# Patient Record
Sex: Male | Born: 1935 | Race: White | Hispanic: No | Marital: Married
Health system: Southern US, Community
[De-identification: ages and names within clinical notes are randomized; demographics above are authoritative.]

## PROBLEM LIST (undated history)

## (undated) DIAGNOSIS — E7800 Pure hypercholesterolemia, unspecified: Secondary | ICD-10-CM

## (undated) DIAGNOSIS — M316 Other giant cell arteritis: Secondary | ICD-10-CM

## (undated) DIAGNOSIS — E039 Hypothyroidism, unspecified: Secondary | ICD-10-CM

## (undated) DIAGNOSIS — H53123 Transient visual loss, bilateral: Secondary | ICD-10-CM

## (undated) DIAGNOSIS — I219 Acute myocardial infarction, unspecified: Secondary | ICD-10-CM

## (undated) DIAGNOSIS — B029 Zoster without complications: Secondary | ICD-10-CM

## (undated) DIAGNOSIS — F32A Depression, unspecified: Secondary | ICD-10-CM

## (undated) DIAGNOSIS — F419 Anxiety disorder, unspecified: Secondary | ICD-10-CM

## (undated) DIAGNOSIS — N400 Enlarged prostate without lower urinary tract symptoms: Secondary | ICD-10-CM

## (undated) DIAGNOSIS — K219 Gastro-esophageal reflux disease without esophagitis: Secondary | ICD-10-CM

## (undated) DIAGNOSIS — M1990 Unspecified osteoarthritis, unspecified site: Secondary | ICD-10-CM

## (undated) DIAGNOSIS — F329 Major depressive disorder, single episode, unspecified: Secondary | ICD-10-CM

## (undated) HISTORY — DX: Zoster without complications: B02.9

## (undated) HISTORY — DX: Major depressive disorder, single episode, unspecified: F32.9

## (undated) HISTORY — PX: NO PAST SURGERIES: SHX2092

## (undated) HISTORY — PX: OTHER SURGICAL HISTORY: SHX169

## (undated) HISTORY — DX: Hypothyroidism, unspecified: E03.9

## (undated) HISTORY — DX: Depression, unspecified: F32.A

---

## 1999-11-08 ENCOUNTER — Observation Stay (HOSPITAL_COMMUNITY): Admission: EM | Admit: 1999-11-08 | Discharge: 1999-11-09 | Payer: Self-pay | Admitting: Emergency Medicine

## 1999-11-08 ENCOUNTER — Encounter: Payer: Self-pay | Admitting: Emergency Medicine

## 2005-01-01 ENCOUNTER — Encounter: Admission: RE | Admit: 2005-01-01 | Discharge: 2005-01-01 | Payer: Self-pay | Admitting: Sports Medicine

## 2005-03-03 ENCOUNTER — Encounter: Admission: RE | Admit: 2005-03-03 | Discharge: 2005-03-03 | Payer: Self-pay | Admitting: Orthopedic Surgery

## 2005-03-31 ENCOUNTER — Encounter: Admission: RE | Admit: 2005-03-31 | Discharge: 2005-03-31 | Payer: Self-pay | Admitting: Orthopedic Surgery

## 2005-06-24 ENCOUNTER — Encounter: Admission: RE | Admit: 2005-06-24 | Discharge: 2005-06-24 | Payer: Self-pay | Admitting: Orthopedic Surgery

## 2009-09-26 ENCOUNTER — Encounter: Admission: RE | Admit: 2009-09-26 | Discharge: 2009-09-26 | Payer: Self-pay | Admitting: Sports Medicine

## 2010-01-21 ENCOUNTER — Encounter: Admission: RE | Admit: 2010-01-21 | Discharge: 2010-01-21 | Payer: Self-pay | Admitting: Orthopedic Surgery

## 2010-02-18 ENCOUNTER — Encounter: Admission: RE | Admit: 2010-02-18 | Discharge: 2010-02-18 | Payer: Self-pay | Admitting: Orthopedic Surgery

## 2010-04-21 ENCOUNTER — Encounter: Admission: RE | Admit: 2010-04-21 | Discharge: 2010-04-21 | Payer: Self-pay | Admitting: Sports Medicine

## 2010-09-14 ENCOUNTER — Encounter: Payer: Self-pay | Admitting: Orthopedic Surgery

## 2011-01-09 NOTE — Discharge Summary (Signed)
[employer]. [facility]  Patient:    Benjamin Hahn, Benjamin Hahn                   MRN: [id number] Adm. Date:  [id number] Disc. Date: [id number] Attending:  Reather Littler CC:         Peter M. Swaziland, M.D.                           Discharge Summary  HISTORY:  Patient was admitted with acute onset of chest pain in the morning of admission.  Chest pain was a heavy feeling with increase with respiration and not relieved by Tums.  He had some relief with nitroglycerin in the ER, but only about a half an hour later.  He also had some increased relief with GI cocktail.  MEDICATIONS:  Lipitor, Synthroid, imipramine, Buspar, Wellbutrin, Cardura, and Androgel.  PHYSICAL EXAMINATION:  Patients blood pressure was 154/90 on arrival to ER. General exam was normal.  Eyes, ENT normal.  Heart tones normal.  Lungs clear. Abdomen with minimal epigastric tenderness.  The rest of the exam was normal.  HOSPITAL COURSE:  Patients pain did not recur after treatment in the ER.  His heart monitor was normal.  Follow-up EKGs and enzymes were normal.  His white blood count, which was 12,700 on admission, was down to 8500 the next morning. ESR was normal at 6.  Serum chemistries were all normal.  Chest x-ray on admission was normal.  DISCHARGE DIAGNOSES: 1. Atypical chest pain, probably gastroesophageal reflux, possibly related to    recent Motrin use. 2. Rule out coronary artery disease. 3. Mild hypertension and hypercholesterolemia.  DISCHARGE CONDITION:  Improved.  DISCHARGE RECOMMENDATIONS:  Continue previous medications.  Stop Motrin. Start Zantac 300 q.d.  Schedule stress test with Dr. Swaziland in office and continue penicillin as directed by dentist.  May use Darvocet p.r.n. for dental pain.  FOLLOW-UP:  In the office as scheduled. DD:  11/09/99 TD:  11/10/99 Job: [id number]

## 2011-01-09 NOTE — Consult Note (Signed)
[place]. [facility]  Patient:    Benjamin Hahn, Benjamin Hahn                   MRN: [id number] Adm. Date:  [id number] Attending:  Reather Littler CC:         Reather Littler, M.D.                          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 He has no prior history of cardiac disease. He reports a normal stress test 10 years ago. Recently, he has had problems with bad teeth, having ad dental work done and is scheduled to have two teeth pulled. This morning, he awoke with discomfort in his neck and throat, as well as his face in general. The pain in his throat radiates down his sternum and also straight into his back. This pain has been constant throughout the day today and clearly is worse with deep breathing. He denies any positional change in his symptoms. He denies shortness of breath, diaphoresis, nausea, or vomiting. He really did not get any relief with sublingual nitroglycerin or GI cocktail.  PAST MEDICAL HISTORY:  Significant for mild hypertension, hypercholesterolemia,  depression and anxiety.  CURRENT MEDICATIONS:  Lipitor, Wellbutrin, and p.r.n. Motrin.  ALLERGIES:  No known allergies.  SOCIAL HISTORY:  The patient is married. He is an Pensions consultant. He denies tobacco or alcohol use.  FAMILY HISTORY:  Father died at age 6. Mother is alive at age 75 and has a history of fibrillation.  PHYSICAL EXAMINATION:  GENERAL:  The patient is a pleasant, white male in no apparent distress.  VITAL SIGNS:  Blood pressure is 132/76, pulse 88 and regular, temperature is afebrile.  HEENT:  Unremarkable.  NECK:  He has no JVD or bruits.  LUNGS:  Clear.  CARDIAC:  Reveals regular rate and rhythm. Normal S1 and S2 without murmurs, rubs, or gallops.  ABDOMEN:  Soft, nontender. There are no masses or  bruits.  EXTREMITIES:  Without edema. Pulses are 2+ and symmetric.  LABORATORY DATA:  White count is 12,700; with increased neutrophils. Otherwise BC and CMET are normal. CK-MB and troponin are normal.  ECG is normal.  Chest x-ray is normal.  IMPRESSION: 1. Atypical chest pain with pleuritic component. This does not sound anginal to me.    There is no evidence of pericarditis by exam or ECG. Possibly related to upper    respiratory virus or related to his periodontal disease. 2. Hypertension. 3. Hypercholesterolemia, controlled on Lipitor.  PLAN:  I agree with overnight observation. If his cardiac enzymes are negative nd ECG remains normal, would recommend discharge in the morning. Would treat with ral penicillin and/or nonsteroidal anti-inflammatory drug. It may be appropriate to  risk assess this patient given his age and comorbid risk factors with an outpatient stress test. DD:  11/08/99 TD:  11/09/99 Job: [id number]

## 2011-01-09 NOTE — H&P (Signed)
[place]. [facility]  Patient:    Benjamin Hahn, Benjamin Hahn                   MRN: [id number] Adm. Date:  [id number] Attending:  Reather Littler                         History and Physical  CHIEF COMPLAINT:  Chest pain.  HISTORY OF PRESENT ILLNESS:  This is  a 77 year old Caucasian male who started having chest pain after breakfast today.  The pain was in the middle of the chest and was moderately severe.  The pain was like a heaviness and would increase with deep breathing.  The pain seemed to persist off and on throughout the day, and as not relieved by taking Tums.  He had no associated cough or shortness of breath. He also had not experienced any nausea, abdominal pain, dizziness, sweating, or arm pain.  The patient has not had any prior cardiac history.  When he presented to the emergency room the patient was given nitroglycerin, with which the pain did improve, but only after about one-half hour or so.  CURRENT MEDICATIONS: 1. Lipitor. 2. Synthroid. 3. Imipramine. 4. BuSpar. 5. Wellbutrin. 6. Cardura. 7. Androgel.  ALLERGIES:  No known drug allergies.  PAST MEDICAL HISTORY:  History of diverticulitis.  FAMILY HISTORY:  Positive for CVA and diabetes mellitus.  SOCIAL HISTORY:  He is a nonsmoker.  He uses occasional alcohol.  No history of  increased alcohol intake.  REVIEW OF SYSTEMS:  He has had depression, hypogonadism, mild hypertension, prostatism, hypercholesterolemia, and hypothyroidism.  He was recently started n Cardura for hypertension and benign prostatic hypertrophy symptoms.  He also has had recent infection in his teeth, for which he is supposed to be on penicillin, but has been irregular.  He is due to get dental extractions soon.  He has not ad any recent major dental procedures, except for some cleaning.  PHYSICAL EXAMINATION:  GENERAL:  The patient is heavyset.  VITAL SIGNS:  Blood pressure 154/90 on admission to the  emergency room. Followup was 125/80, pulse 76.  GENERAL:  Normal.  HEENT:  Eyes:  Normal ENT examination.  He has dental caries present and several fillings.  Pharynx is normal.  HEART:  Sounds are normal.  No abnormalities of S1 or S2.  No abnormal sounds. No pericardial rub present in the sitting or lying position.  CHEST:  No chest wall tenderness.  LUNGS:  Clear.  NECK:  No carotid bruits.  ABDOMEN:  Mild upper epigastric localized tenderness.  No other tenderness or mass.  RECTAL:  Not indicated.  EXTREMITIES:  Normal.  Electrocardiogram:  Normal.  Chest x-ray:  Normal.  LABORATORY DATA:  White count 12,700.  IMPRESSION:  The patient has somewhat atypical chest pain, but it was apparently relieved with nitroglycerin, although in a delayed fashion.  PLAN:  To admit him to rule out cardiac causes.  Monitor his cardiac enzymes and get a cardiology consultation.  Meanwhile we will have him restart his penicillin and give him Protonix empirically. DD:  11/09/99 TD:  11/09/99 Job: 2023 [id number]

## 2011-09-01 DIAGNOSIS — E291 Testicular hypofunction: Secondary | ICD-10-CM | POA: Diagnosis not present

## 2011-09-16 DIAGNOSIS — E291 Testicular hypofunction: Secondary | ICD-10-CM | POA: Diagnosis not present

## 2011-09-22 DIAGNOSIS — J00 Acute nasopharyngitis [common cold]: Secondary | ICD-10-CM | POA: Diagnosis not present

## 2011-09-30 DIAGNOSIS — E291 Testicular hypofunction: Secondary | ICD-10-CM | POA: Diagnosis not present

## 2011-10-13 DIAGNOSIS — J310 Chronic rhinitis: Secondary | ICD-10-CM | POA: Diagnosis not present

## 2011-10-14 DIAGNOSIS — E291 Testicular hypofunction: Secondary | ICD-10-CM | POA: Diagnosis not present

## 2011-10-28 DIAGNOSIS — E291 Testicular hypofunction: Secondary | ICD-10-CM | POA: Diagnosis not present

## 2011-11-04 DIAGNOSIS — I10 Essential (primary) hypertension: Secondary | ICD-10-CM | POA: Diagnosis not present

## 2011-11-04 DIAGNOSIS — F329 Major depressive disorder, single episode, unspecified: Secondary | ICD-10-CM | POA: Diagnosis not present

## 2011-11-04 DIAGNOSIS — E291 Testicular hypofunction: Secondary | ICD-10-CM | POA: Diagnosis not present

## 2011-11-04 DIAGNOSIS — R635 Abnormal weight gain: Secondary | ICD-10-CM | POA: Diagnosis not present

## 2011-11-11 DIAGNOSIS — E291 Testicular hypofunction: Secondary | ICD-10-CM | POA: Diagnosis not present

## 2011-11-25 DIAGNOSIS — E291 Testicular hypofunction: Secondary | ICD-10-CM | POA: Diagnosis not present

## 2011-12-10 DIAGNOSIS — E291 Testicular hypofunction: Secondary | ICD-10-CM | POA: Diagnosis not present

## 2011-12-23 DIAGNOSIS — E291 Testicular hypofunction: Secondary | ICD-10-CM | POA: Diagnosis not present

## 2012-01-01 DIAGNOSIS — M5430 Sciatica, unspecified side: Secondary | ICD-10-CM | POA: Diagnosis not present

## 2012-01-06 DIAGNOSIS — E291 Testicular hypofunction: Secondary | ICD-10-CM | POA: Diagnosis not present

## 2012-01-11 DIAGNOSIS — R972 Elevated prostate specific antigen [PSA]: Secondary | ICD-10-CM | POA: Diagnosis not present

## 2012-01-11 DIAGNOSIS — E291 Testicular hypofunction: Secondary | ICD-10-CM | POA: Diagnosis not present

## 2012-01-20 DIAGNOSIS — R3915 Urgency of urination: Secondary | ICD-10-CM | POA: Diagnosis not present

## 2012-01-20 DIAGNOSIS — N401 Enlarged prostate with lower urinary tract symptoms: Secondary | ICD-10-CM | POA: Diagnosis not present

## 2012-01-20 DIAGNOSIS — E291 Testicular hypofunction: Secondary | ICD-10-CM | POA: Diagnosis not present

## 2012-01-20 DIAGNOSIS — N529 Male erectile dysfunction, unspecified: Secondary | ICD-10-CM | POA: Diagnosis not present

## 2012-02-04 DIAGNOSIS — E291 Testicular hypofunction: Secondary | ICD-10-CM | POA: Diagnosis not present

## 2012-02-09 DIAGNOSIS — E291 Testicular hypofunction: Secondary | ICD-10-CM | POA: Diagnosis not present

## 2012-02-09 DIAGNOSIS — E039 Hypothyroidism, unspecified: Secondary | ICD-10-CM | POA: Diagnosis not present

## 2012-02-09 DIAGNOSIS — E7800 Pure hypercholesterolemia, unspecified: Secondary | ICD-10-CM | POA: Diagnosis not present

## 2012-02-09 DIAGNOSIS — I10 Essential (primary) hypertension: Secondary | ICD-10-CM | POA: Diagnosis not present

## 2012-02-11 DIAGNOSIS — E291 Testicular hypofunction: Secondary | ICD-10-CM | POA: Diagnosis not present

## 2012-02-11 DIAGNOSIS — F329 Major depressive disorder, single episode, unspecified: Secondary | ICD-10-CM | POA: Diagnosis not present

## 2012-02-11 DIAGNOSIS — E7800 Pure hypercholesterolemia, unspecified: Secondary | ICD-10-CM | POA: Diagnosis not present

## 2012-02-11 DIAGNOSIS — E039 Hypothyroidism, unspecified: Secondary | ICD-10-CM | POA: Diagnosis not present

## 2012-02-11 DIAGNOSIS — M899 Disorder of bone, unspecified: Secondary | ICD-10-CM | POA: Diagnosis not present

## 2012-02-11 DIAGNOSIS — Z0000 Encounter for general adult medical examination without abnormal findings: Secondary | ICD-10-CM | POA: Diagnosis not present

## 2012-02-11 DIAGNOSIS — I10 Essential (primary) hypertension: Secondary | ICD-10-CM | POA: Diagnosis not present

## 2012-02-18 DIAGNOSIS — E291 Testicular hypofunction: Secondary | ICD-10-CM | POA: Diagnosis not present

## 2012-02-29 ENCOUNTER — Other Ambulatory Visit: Payer: Self-pay | Admitting: Sports Medicine

## 2012-02-29 DIAGNOSIS — M5440 Lumbago with sciatica, unspecified side: Secondary | ICD-10-CM

## 2012-02-29 DIAGNOSIS — M5430 Sciatica, unspecified side: Secondary | ICD-10-CM | POA: Diagnosis not present

## 2012-03-01 DIAGNOSIS — M899 Disorder of bone, unspecified: Secondary | ICD-10-CM | POA: Diagnosis not present

## 2012-03-01 DIAGNOSIS — M949 Disorder of cartilage, unspecified: Secondary | ICD-10-CM | POA: Diagnosis not present

## 2012-03-03 DIAGNOSIS — E291 Testicular hypofunction: Secondary | ICD-10-CM | POA: Diagnosis not present

## 2012-03-11 ENCOUNTER — Ambulatory Visit
Admission: RE | Admit: 2012-03-11 | Discharge: 2012-03-11 | Disposition: A | Discharge status: Home / Self Care | Payer: Medicare Other | Source: Ambulatory Visit | Attending: Sports Medicine | Admitting: Sports Medicine

## 2012-03-11 DIAGNOSIS — M5440 Lumbago with sciatica, unspecified side: Secondary | ICD-10-CM

## 2012-03-11 DIAGNOSIS — M545 Low back pain: Secondary | ICD-10-CM | POA: Diagnosis not present

## 2012-03-12 ENCOUNTER — Other Ambulatory Visit: Payer: Self-pay

## 2012-03-17 DIAGNOSIS — E291 Testicular hypofunction: Secondary | ICD-10-CM | POA: Diagnosis not present

## 2012-03-25 DIAGNOSIS — Z1211 Encounter for screening for malignant neoplasm of colon: Secondary | ICD-10-CM | POA: Diagnosis not present

## 2012-03-30 DIAGNOSIS — E291 Testicular hypofunction: Secondary | ICD-10-CM | POA: Diagnosis not present

## 2012-04-06 DIAGNOSIS — H5210 Myopia, unspecified eye: Secondary | ICD-10-CM | POA: Diagnosis not present

## 2012-04-06 DIAGNOSIS — H25019 Cortical age-related cataract, unspecified eye: Secondary | ICD-10-CM | POA: Diagnosis not present

## 2012-04-06 DIAGNOSIS — H52229 Regular astigmatism, unspecified eye: Secondary | ICD-10-CM | POA: Diagnosis not present

## 2012-04-06 DIAGNOSIS — H524 Presbyopia: Secondary | ICD-10-CM | POA: Diagnosis not present

## 2012-04-13 DIAGNOSIS — E291 Testicular hypofunction: Secondary | ICD-10-CM | POA: Diagnosis not present

## 2012-04-27 DIAGNOSIS — E291 Testicular hypofunction: Secondary | ICD-10-CM | POA: Diagnosis not present

## 2012-05-11 DIAGNOSIS — D235 Other benign neoplasm of skin of trunk: Secondary | ICD-10-CM | POA: Diagnosis not present

## 2012-05-11 DIAGNOSIS — E291 Testicular hypofunction: Secondary | ICD-10-CM | POA: Diagnosis not present

## 2012-05-11 DIAGNOSIS — D485 Neoplasm of uncertain behavior of skin: Secondary | ICD-10-CM | POA: Diagnosis not present

## 2012-05-18 DIAGNOSIS — Z23 Encounter for immunization: Secondary | ICD-10-CM | POA: Diagnosis not present

## 2012-05-25 DIAGNOSIS — E291 Testicular hypofunction: Secondary | ICD-10-CM | POA: Diagnosis not present

## 2012-06-08 DIAGNOSIS — E291 Testicular hypofunction: Secondary | ICD-10-CM | POA: Diagnosis not present

## 2012-06-22 DIAGNOSIS — E291 Testicular hypofunction: Secondary | ICD-10-CM | POA: Diagnosis not present

## 2012-07-06 DIAGNOSIS — E291 Testicular hypofunction: Secondary | ICD-10-CM | POA: Diagnosis not present

## 2012-07-19 DIAGNOSIS — R972 Elevated prostate specific antigen [PSA]: Secondary | ICD-10-CM | POA: Diagnosis not present

## 2012-07-19 DIAGNOSIS — E291 Testicular hypofunction: Secondary | ICD-10-CM | POA: Diagnosis not present

## 2012-07-20 DIAGNOSIS — E291 Testicular hypofunction: Secondary | ICD-10-CM | POA: Diagnosis not present

## 2012-07-27 DIAGNOSIS — E291 Testicular hypofunction: Secondary | ICD-10-CM | POA: Diagnosis not present

## 2012-07-27 DIAGNOSIS — N401 Enlarged prostate with lower urinary tract symptoms: Secondary | ICD-10-CM | POA: Diagnosis not present

## 2012-07-27 DIAGNOSIS — N529 Male erectile dysfunction, unspecified: Secondary | ICD-10-CM | POA: Diagnosis not present

## 2012-07-27 DIAGNOSIS — R3915 Urgency of urination: Secondary | ICD-10-CM | POA: Diagnosis not present

## 2012-08-03 DIAGNOSIS — E291 Testicular hypofunction: Secondary | ICD-10-CM | POA: Diagnosis not present

## 2012-08-22 DIAGNOSIS — E291 Testicular hypofunction: Secondary | ICD-10-CM | POA: Diagnosis not present

## 2012-09-05 DIAGNOSIS — E7800 Pure hypercholesterolemia, unspecified: Secondary | ICD-10-CM | POA: Diagnosis not present

## 2012-09-05 DIAGNOSIS — I10 Essential (primary) hypertension: Secondary | ICD-10-CM | POA: Diagnosis not present

## 2012-09-05 DIAGNOSIS — E039 Hypothyroidism, unspecified: Secondary | ICD-10-CM | POA: Diagnosis not present

## 2012-09-05 DIAGNOSIS — E291 Testicular hypofunction: Secondary | ICD-10-CM | POA: Diagnosis not present

## 2012-09-07 DIAGNOSIS — E039 Hypothyroidism, unspecified: Secondary | ICD-10-CM | POA: Diagnosis not present

## 2012-09-07 DIAGNOSIS — E291 Testicular hypofunction: Secondary | ICD-10-CM | POA: Diagnosis not present

## 2012-09-07 DIAGNOSIS — E7800 Pure hypercholesterolemia, unspecified: Secondary | ICD-10-CM | POA: Diagnosis not present

## 2012-09-07 DIAGNOSIS — I10 Essential (primary) hypertension: Secondary | ICD-10-CM | POA: Diagnosis not present

## 2012-09-07 DIAGNOSIS — F329 Major depressive disorder, single episode, unspecified: Secondary | ICD-10-CM | POA: Diagnosis not present

## 2012-09-22 DIAGNOSIS — E291 Testicular hypofunction: Secondary | ICD-10-CM | POA: Diagnosis not present

## 2012-10-05 DIAGNOSIS — E291 Testicular hypofunction: Secondary | ICD-10-CM | POA: Diagnosis not present

## 2012-11-02 DIAGNOSIS — E291 Testicular hypofunction: Secondary | ICD-10-CM | POA: Diagnosis not present

## 2012-11-15 DIAGNOSIS — IMO0002 Reserved for concepts with insufficient information to code with codable children: Secondary | ICD-10-CM | POA: Diagnosis not present

## 2012-11-16 DIAGNOSIS — E291 Testicular hypofunction: Secondary | ICD-10-CM | POA: Diagnosis not present

## 2012-12-15 DIAGNOSIS — E291 Testicular hypofunction: Secondary | ICD-10-CM | POA: Diagnosis not present

## 2012-12-22 DIAGNOSIS — B029 Zoster without complications: Secondary | ICD-10-CM

## 2012-12-22 HISTORY — DX: Zoster without complications: B02.9

## 2013-01-03 DIAGNOSIS — E291 Testicular hypofunction: Secondary | ICD-10-CM | POA: Diagnosis not present

## 2013-01-18 DIAGNOSIS — E291 Testicular hypofunction: Secondary | ICD-10-CM | POA: Diagnosis not present

## 2013-01-26 DIAGNOSIS — E291 Testicular hypofunction: Secondary | ICD-10-CM | POA: Diagnosis not present

## 2013-02-01 DIAGNOSIS — N401 Enlarged prostate with lower urinary tract symptoms: Secondary | ICD-10-CM | POA: Diagnosis not present

## 2013-02-01 DIAGNOSIS — E291 Testicular hypofunction: Secondary | ICD-10-CM | POA: Diagnosis not present

## 2013-02-01 DIAGNOSIS — N529 Male erectile dysfunction, unspecified: Secondary | ICD-10-CM | POA: Diagnosis not present

## 2013-02-08 DIAGNOSIS — E291 Testicular hypofunction: Secondary | ICD-10-CM | POA: Diagnosis not present

## 2013-03-01 DIAGNOSIS — E291 Testicular hypofunction: Secondary | ICD-10-CM | POA: Diagnosis not present

## 2013-03-07 ENCOUNTER — Other Ambulatory Visit: Payer: Medicare Other

## 2013-03-09 ENCOUNTER — Ambulatory Visit: Payer: Medicare Other | Admitting: Endocrinology

## 2013-03-10 ENCOUNTER — Ambulatory Visit (INDEPENDENT_AMBULATORY_CARE_PROVIDER_SITE_OTHER): Payer: Medicare Other | Admitting: Endocrinology

## 2013-03-10 ENCOUNTER — Encounter: Payer: Self-pay | Admitting: Endocrinology

## 2013-03-10 VITALS — BP 106/60 | HR 83 | Temp 98.7°F | Resp 12 | Ht 68.0 in

## 2013-03-10 DIAGNOSIS — E785 Hyperlipidemia, unspecified: Secondary | ICD-10-CM

## 2013-03-10 DIAGNOSIS — E039 Hypothyroidism, unspecified: Secondary | ICD-10-CM

## 2013-03-10 DIAGNOSIS — E291 Testicular hypofunction: Secondary | ICD-10-CM

## 2013-03-10 DIAGNOSIS — N400 Enlarged prostate without lower urinary tract symptoms: Secondary | ICD-10-CM | POA: Diagnosis not present

## 2013-03-10 DIAGNOSIS — B029 Zoster without complications: Secondary | ICD-10-CM | POA: Diagnosis not present

## 2013-03-10 DIAGNOSIS — N529 Male erectile dysfunction, unspecified: Secondary | ICD-10-CM

## 2013-03-10 MED ORDER — HYDROCODONE-ACETAMINOPHEN 5-300 MG PO TABS: 5.0000 mg | ORAL_TABLET | Freq: Three times a day (TID) | ORAL | Status: DC | PRN

## 2013-03-10 NOTE — Progress Notes (Signed)
Subjective:     Patient ID: Benjamin Hahn, male   DOB: 1933-03-14, 79 y.o.   MRN: [id number]  HPI  Headache and rash on the forehead for 2 days. This started over the weekend and he was having soreness and tenderness in the head area, slightly better today He was seen by the dermatologist this morning and was told to have shingles, has not filled his prescription for Valtrex and a local application that was given  Review of Systems  Cardiovascular:       Has had mild hypertension  Genitourinary:       Erectile dysfunction: he says he has been trying a new medication for this from urologist       Objective:   Physical Exam  Eyes: Conjunctivae are normal.  He has a small maculopapular lesion on the top of the right eyelid, slightly tender. Cornea appears normal.  Skin: Rash noted.  He has maculopapular reddish rash in the trigeminal distribution on the right forehead and proximal scalp area, no clear vesiculation   BP 106/60  Pulse 83  Temp(Src) 98.7 F (37.1 C)  Resp 12  Ht 5\' 8"  (1.727 m)  SpO2 95%     Assessment:     Herpes zoster in the trigeminal area  History of hypertension, mild now with low normal blood pressure    Plan:     He will start the Valtrex given by dermatologist today Call if having increased rash or discomfort Prescription sent for hydrocodone to take as needed She will get an eye exam as soon as possible with his ophthalmologist to rule out any corneal lesions  Stop lisinopril until next visit

## 2013-03-10 NOTE — Patient Instructions (Signed)
Stop Lisinopril.

## 2013-03-13 DIAGNOSIS — B0229 Other postherpetic nervous system involvement: Secondary | ICD-10-CM | POA: Diagnosis not present

## 2013-03-14 DIAGNOSIS — E785 Hyperlipidemia, unspecified: Secondary | ICD-10-CM | POA: Insufficient documentation

## 2013-03-14 DIAGNOSIS — E291 Testicular hypofunction: Secondary | ICD-10-CM | POA: Insufficient documentation

## 2013-03-14 DIAGNOSIS — E039 Hypothyroidism, unspecified: Secondary | ICD-10-CM | POA: Insufficient documentation

## 2013-03-14 DIAGNOSIS — N400 Enlarged prostate without lower urinary tract symptoms: Secondary | ICD-10-CM | POA: Insufficient documentation

## 2013-03-14 DIAGNOSIS — N529 Male erectile dysfunction, unspecified: Secondary | ICD-10-CM | POA: Insufficient documentation

## 2013-03-16 ENCOUNTER — Telehealth: Payer: Self-pay | Admitting: *Deleted

## 2013-03-16 NOTE — Telephone Encounter (Signed)
Pt is aware.  

## 2013-03-16 NOTE — Telephone Encounter (Signed)
Since prednisone may worsen the viral infection would prefer not to do this He can just try warm compresses

## 2013-03-16 NOTE — Telephone Encounter (Signed)
Pt called back and said there was swelling on the right side of his face from the shingles, he wants to know if you can call in some prednisone for him. CB [phone],  [employer].

## 2013-03-20 ENCOUNTER — Telehealth: Payer: Self-pay | Admitting: *Deleted

## 2013-03-20 DIAGNOSIS — B0229 Other postherpetic nervous system involvement: Secondary | ICD-10-CM | POA: Diagnosis not present

## 2013-03-20 MED ORDER — GABAPENTIN 300 MG PO CAPS: 300.0000 mg | ORAL_CAPSULE | Freq: Three times a day (TID) | ORAL | Status: DC

## 2013-03-20 NOTE — Telephone Encounter (Signed)
Mr. Claud called, he said he's having a horrible time with intense headaches caused by the shingles, he said a pharmacist friend told him that he should try Gabapentin to help with the headaches, he wants to know if you will call this in for him to [facility].

## 2013-03-20 NOTE — Telephone Encounter (Signed)
We can try gabapentin 300 mg 3 times a day, will send this

## 2013-03-20 NOTE — Telephone Encounter (Signed)
rx sent

## 2013-03-29 DIAGNOSIS — E291 Testicular hypofunction: Secondary | ICD-10-CM | POA: Diagnosis not present

## 2013-04-05 ENCOUNTER — Other Ambulatory Visit: Payer: Medicare Other

## 2013-04-05 ENCOUNTER — Ambulatory Visit: Payer: Medicare Other | Admitting: Endocrinology

## 2013-04-06 ENCOUNTER — Telehealth: Payer: Self-pay | Admitting: *Deleted

## 2013-04-06 ENCOUNTER — Other Ambulatory Visit: Payer: Self-pay | Admitting: *Deleted

## 2013-04-06 MED ORDER — ATORVASTATIN CALCIUM 10 MG PO TABS: ORAL_TABLET | ORAL | Status: DC

## 2013-04-06 NOTE — Telephone Encounter (Signed)
Pt wants to know if he's ever had a shingles vaccine?  He wants to know if it's okay to go to the [facility] and sit in the steam room and sauna, he was told he should not sweat.  He also states he's still having bad pain on the right side of his head. CB # [id number]

## 2013-04-06 NOTE — Telephone Encounter (Signed)
Noted pt is aware 

## 2013-04-06 NOTE — Telephone Encounter (Signed)
Did not find any record of shingles vaccine and once he has had the shingles it is not useful  He can double doses of gabapentin for the pain as needed  As long as the skin lesions are healed [facility]

## 2013-04-07 ENCOUNTER — Ambulatory Visit: Payer: Medicare Other | Admitting: Endocrinology

## 2013-04-12 DIAGNOSIS — E291 Testicular hypofunction: Secondary | ICD-10-CM | POA: Diagnosis not present

## 2013-04-13 ENCOUNTER — Other Ambulatory Visit: Payer: Self-pay | Admitting: *Deleted

## 2013-04-17 ENCOUNTER — Other Ambulatory Visit: Payer: Self-pay | Admitting: *Deleted

## 2013-04-17 MED ORDER — RAMELTEON 8 MG PO TABS: 8.0000 mg | ORAL_TABLET | Freq: Every day | ORAL | Status: DC

## 2013-04-21 ENCOUNTER — Other Ambulatory Visit: Payer: Medicare Other

## 2013-04-26 DIAGNOSIS — E291 Testicular hypofunction: Secondary | ICD-10-CM | POA: Diagnosis not present

## 2013-04-28 ENCOUNTER — Ambulatory Visit: Payer: Medicare Other | Admitting: Endocrinology

## 2013-05-01 ENCOUNTER — Other Ambulatory Visit: Payer: Self-pay | Admitting: *Deleted

## 2013-05-01 MED ORDER — CYCLOBENZAPRINE HCL 10 MG PO TABS: ORAL_TABLET | ORAL | Status: DC

## 2013-05-02 DIAGNOSIS — M545 Low back pain, unspecified: Secondary | ICD-10-CM | POA: Diagnosis not present

## 2013-05-08 ENCOUNTER — Other Ambulatory Visit: Payer: Self-pay | Admitting: *Deleted

## 2013-05-08 MED ORDER — ALPRAZOLAM 0.5 MG PO TBDP: 0.5000 mg | ORAL_TABLET | Freq: Two times a day (BID) | ORAL | Status: DC | PRN

## 2013-05-08 MED ORDER — LEVOTHYROXINE SODIUM 50 MCG PO TABS: 50.0000 ug | ORAL_TABLET | Freq: Every day | ORAL | Status: DC

## 2013-05-08 NOTE — Telephone Encounter (Signed)
Opened encounter in error  

## 2013-05-09 ENCOUNTER — Other Ambulatory Visit: Payer: Self-pay | Admitting: *Deleted

## 2013-05-09 DIAGNOSIS — E291 Testicular hypofunction: Secondary | ICD-10-CM | POA: Diagnosis not present

## 2013-05-09 MED ORDER — LEVOTHYROXINE SODIUM 112 MCG PO TABS: 112.0000 ug | ORAL_TABLET | Freq: Every day | ORAL | Status: DC

## 2013-05-15 DIAGNOSIS — H26019 Infantile and juvenile cortical, lamellar, or zonular cataract, unspecified eye: Secondary | ICD-10-CM | POA: Diagnosis not present

## 2013-05-15 DIAGNOSIS — H2510 Age-related nuclear cataract, unspecified eye: Secondary | ICD-10-CM | POA: Diagnosis not present

## 2013-05-23 DIAGNOSIS — E291 Testicular hypofunction: Secondary | ICD-10-CM | POA: Diagnosis not present

## 2013-05-24 ENCOUNTER — Encounter: Payer: Self-pay | Admitting: Endocrinology

## 2013-05-24 ENCOUNTER — Ambulatory Visit (INDEPENDENT_AMBULATORY_CARE_PROVIDER_SITE_OTHER): Payer: Medicare Other | Admitting: Endocrinology

## 2013-05-24 VITALS — BP 122/58 | HR 89 | Temp 98.5°F | Resp 12 | Ht 68.0 in | Wt 196.5 lb

## 2013-05-24 DIAGNOSIS — IMO0001 Reserved for inherently not codable concepts without codable children: Secondary | ICD-10-CM | POA: Diagnosis not present

## 2013-05-24 DIAGNOSIS — R269 Unspecified abnormalities of gait and mobility: Secondary | ICD-10-CM

## 2013-05-24 DIAGNOSIS — M797 Fibromyalgia: Secondary | ICD-10-CM

## 2013-05-24 DIAGNOSIS — E785 Hyperlipidemia, unspecified: Secondary | ICD-10-CM

## 2013-05-24 DIAGNOSIS — E039 Hypothyroidism, unspecified: Secondary | ICD-10-CM | POA: Diagnosis not present

## 2013-05-24 DIAGNOSIS — H532 Diplopia: Secondary | ICD-10-CM | POA: Diagnosis not present

## 2013-05-24 DIAGNOSIS — I951 Orthostatic hypotension: Secondary | ICD-10-CM

## 2013-05-24 LAB — CBC WITH DIFFERENTIAL/PLATELET
Basophils Absolute: 0 10*3/uL (ref 0.0–0.1)
Basophils Relative: 0.4 % (ref 0.0–3.0)
Eosinophils Relative: 1.1 % (ref 0.0–5.0)
HCT: 43.9 % (ref 39.0–52.0)
Lymphs Abs: 1.5 10*3/uL (ref 0.7–4.0)
MCHC: 33.6 g/dL (ref 30.0–36.0)
Monocytes Absolute: 0.8 10*3/uL (ref 0.1–1.0)
Monocytes Relative: 8.8 % (ref 3.0–12.0)
Neutrophils Relative %: 73.5 % (ref 43.0–77.0)
Platelets: 227 10*3/uL (ref 150.0–400.0)
RBC: 4.62 Mil/uL (ref 4.22–5.81)
WBC: 9.3 10*3/uL (ref 4.5–10.5)

## 2013-05-24 MED ORDER — ASPIRIN-DIPYRIDAMOLE ER 25-200 MG PO CP12: 1.0000 | ORAL_CAPSULE | Freq: Two times a day (BID) | ORAL | Status: DC

## 2013-05-24 NOTE — Progress Notes (Signed)
Subjective:     Patient ID: Benjamin Hahn, male   DOB: Aug 03, 1933, 79 y.o.   MRN: [id number]  HPI  The patient woke up this morning with his eyes swollen and he did not feel well in general. He says he had trouble clearly thinking and speaking and also difficulty moving around. He felt somewhat sedated. Also had some discomfort in his left eyeball but no headache. He did not have any numbness or weakness in any particular extremity. However he feels a little wobbly when he is trying to walk He still feels out of sorts this afternoon but cannot be more specific. He still has a little difficulty remembering things Currently not taking any aspirin  Review of Systems  Cardiovascular:       Has had mild hypertension was told to stop his Cardura because of low normal blood pressure but he went back starting it since blood pressure was higher at drug store ? Level       Objective:   Physical Exam  Eyes: Conjunctivae are normal.  He has a mild deviation of his left eye superiorly and laterally. However pupils are equal and reacting to light, conjunctivae are normal. Fundi show normal discs and vessels  Cardiovascular: Normal rate, regular rhythm and normal heart sounds.   Pulmonary/Chest: No respiratory distress. He has no rales.  Musculoskeletal: He exhibits no edema.  Neurological: No cranial nerve deficit. He exhibits normal muscle tone. Coordination normal.  Has minimal drooping of the left upper eyelid. Facial nerve appears to be normal. Ocular movements are normal. Gait is slightly shuffling. Can not walk tandem properly. Finger to nose test is normal He has a little diplopia occasionally, noticed first when doing finger-to-nose testing. No arm drift.   BP 122/58  Pulse 89  Temp(Src) 98.5 F (36.9 C)  Resp 12  Ht 5\' 8"  (1.727 m)  Wt 196 lb 8 oz (89.132 kg)  BMI 29.88 kg/m2  SpO2 95%    Repeat blood pressure standing 100/62  Assessment:     Nonspecific symptoms of  sedation, gait difficulty and occasional diplopia noticed this afternoon With his symptoms and his age will need to rule out a posterior circulation CVA; he has only minimal objective findings today He is quite reluctant to go to the emergency room as recommended today and also is hesitating about getting his MRI done Informed him that it is his responsibility to followup on the directions since he may have progression of her symptoms Also not clear why he had swelling of his eyes in the morning, has had recent eye exam    Plan:     He will start taking Aggrenox empirically MRI scan to be done urgently Recommended that he go to the emergency room if he has any worsening of his speech, gait or diplopia Consider neurology consultation  Stop lisinopril and doxazosin until next visit

## 2013-05-24 NOTE — Patient Instructions (Addendum)
Aggrenox twice daily  Stop Lisinopril and Doxazosin  Go to ER if having wosening difficulty walking, speech or double vision   No Xanax and Flexeril tonight

## 2013-05-25 ENCOUNTER — Telehealth: Payer: Self-pay | Admitting: Endocrinology

## 2013-05-25 LAB — COMPREHENSIVE METABOLIC PANEL
ALT: 15 U/L (ref 0–53)
AST: 17 U/L (ref 0–37)
CO2: 29 mEq/L (ref 19–32)
Calcium: 9 mg/dL (ref 8.4–10.5)
Chloride: 103 mEq/L (ref 96–112)
GFR: 53.82 mL/min — ABNORMAL LOW (ref >60.00)
Sodium: 136 mEq/L (ref 135–145)
Total Bilirubin: 0.8 mg/dL (ref 0.3–1.2)
Total Protein: 7.2 g/dL (ref 6.0–8.3)

## 2013-05-25 LAB — TSH: TSH: 0.9 u[IU]/mL (ref 0.35–5.50)

## 2013-05-25 LAB — LIPID PANEL: Total CHOL/HDL Ratio: 3

## 2013-05-25 NOTE — Telephone Encounter (Signed)
Pt declined MRI appt

## 2013-05-26 DIAGNOSIS — H1010 Acute atopic conjunctivitis, unspecified eye: Secondary | ICD-10-CM | POA: Diagnosis not present

## 2013-05-29 ENCOUNTER — Other Ambulatory Visit: Payer: Self-pay | Admitting: Endocrinology

## 2013-05-29 ENCOUNTER — Telehealth: Payer: Self-pay | Admitting: *Deleted

## 2013-05-29 ENCOUNTER — Other Ambulatory Visit: Payer: Self-pay | Admitting: *Deleted

## 2013-05-29 DIAGNOSIS — M545 Low back pain, unspecified: Secondary | ICD-10-CM | POA: Diagnosis not present

## 2013-05-29 DIAGNOSIS — G453 Amaurosis fugax: Secondary | ICD-10-CM

## 2013-05-29 MED ORDER — CYCLOBENZAPRINE HCL 10 MG PO TABS: ORAL_TABLET | ORAL | Status: DC

## 2013-05-29 MED ORDER — VENLAFAXINE HCL 37.5 MG PO TABS: 37.5000 mg | ORAL_TABLET | Freq: Two times a day (BID) | ORAL | Status: DC

## 2013-05-29 MED ORDER — CLOPIDOGREL BISULFATE 75 MG PO TABS: 75.0000 mg | ORAL_TABLET | Freq: Every day | ORAL | Status: DC

## 2013-05-29 NOTE — Telephone Encounter (Signed)
Pt is requesting a refill of Effexor XR 37.5 mg okay to send? Also he says he will have an MRI done and wants to know if you can make another referral, he wants to use the [facility] on [address].

## 2013-05-29 NOTE — Telephone Encounter (Signed)
Okay if he is not having any double vision or unsteady gait

## 2013-05-29 NOTE — Telephone Encounter (Signed)
Spoke to the patient. He says he had loss of vision in one eye for 5 minutes last night He agrees to do an MRI, neurology consultation, carotid Dopplers and start Plavix 75 mg daily. Still hold antihypertensives, continue generic Effexor

## 2013-05-29 NOTE — Telephone Encounter (Signed)
noted 

## 2013-05-30 ENCOUNTER — Other Ambulatory Visit: Payer: Self-pay | Admitting: *Deleted

## 2013-06-01 ENCOUNTER — Encounter: Payer: Self-pay | Admitting: Neurology

## 2013-06-01 ENCOUNTER — Ambulatory Visit (INDEPENDENT_AMBULATORY_CARE_PROVIDER_SITE_OTHER): Payer: Medicare Other | Admitting: Neurology

## 2013-06-01 ENCOUNTER — Ambulatory Visit (INDEPENDENT_AMBULATORY_CARE_PROVIDER_SITE_OTHER)
Admission: RE | Admit: 2013-06-01 | Discharge: 2013-06-01 | Disposition: A | Discharge status: Home / Self Care | Payer: Medicare Other | Source: Ambulatory Visit | Attending: Neurology | Admitting: Neurology

## 2013-06-01 VITALS — BP 130/62 | HR 86 | Temp 97.8°F | Resp 16 | Ht 68.0 in

## 2013-06-01 DIAGNOSIS — G453 Amaurosis fugax: Secondary | ICD-10-CM

## 2013-06-01 DIAGNOSIS — H3400 Transient retinal artery occlusion, unspecified eye: Secondary | ICD-10-CM | POA: Diagnosis not present

## 2013-06-01 DIAGNOSIS — H4450 Unspecified degenerated conditions of globe: Secondary | ICD-10-CM | POA: Diagnosis not present

## 2013-06-01 DIAGNOSIS — R7309 Other abnormal glucose: Secondary | ICD-10-CM | POA: Diagnosis not present

## 2013-06-01 DIAGNOSIS — I6529 Occlusion and stenosis of unspecified carotid artery: Secondary | ICD-10-CM | POA: Diagnosis not present

## 2013-06-01 DIAGNOSIS — G459 Transient cerebral ischemic attack, unspecified: Secondary | ICD-10-CM

## 2013-06-01 LAB — SEDIMENTATION RATE: Sed Rate: 41 mm/hr — ABNORMAL HIGH (ref 0–22)

## 2013-06-01 LAB — HEMOGLOBIN A1C: Hgb A1c MFr Bld: 6.1 % (ref 4.6–6.5)

## 2013-06-01 LAB — C-REACTIVE PROTEIN: CRP: 5.1 mg/dL (ref 0.5–20.0)

## 2013-06-01 MED ORDER — IOHEXOL 350 MG/ML SOLN
80.0000 mL | Freq: Once | INTRAVENOUS | Status: AC | PRN
  Administered 2013-06-01: 80 mL via INTRAVENOUS

## 2013-06-01 NOTE — Progress Notes (Addendum)
[facility] Neurology Division Clinic Note - Initial Visit   Date: 06/01/2013    GUY SEESE MRN: [id number] DOB: 1933-05-31   Dear Dr Lucianne Muss:  Thank you for your kind referral of Benjamin Hahn for consultation of transient visual loss. Although his history is well known to you, please allow [place] to reiterate it for the purpose of our medical record. The patient was accompanied to the clinic by self.   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    Over the past 10-days, he reports having sudden vision loss of his left eye, described as a "shade being pulled over".  Symptoms last about 2-3 minutes, it occurs daily, but only at night.  He has noticed that his speech has become slightly slurred and stuttering some words, but this occurs independent of vision symptoms.  He has associated double vision with objects on top of each other, lasting seconds.  He denies associated facial numbness, weakness of his arms or legs, numbness/tingling.  No history of TIAs, stroke, or diabetes.  He saw Dr. Lucianne Muss for the above complaints and was started on plavix 75mg  daily.  He was already taking aspirin 81 mg and Lipitor 10 mg daily.  MRI brain was scheduled, but has not been completed as of yet.  He saw Dr. Nile Riggs, his opthalmologist, who evaluated the patient last week and did not see any abnormalities on funduscopic examination.  Of note, he reports having shingles in July 2014 involving the right side of the face (V1-V2).  He has since developed bilateral eye pain. He had a dilated eye exam which did not reveal any involvement of the eye.  He was treated valcyclovir, but continues to have intermittent right-sided headaches.  Out-side paper records, electronic medical record, and images  have been reviewed where available and summarized as:  MRI Lumbar spine wo contrast 03/11/2012 1. Mild to moderate central canal narrowing at L4-5 with some narrowing of the lateral recesses, greater on the right. Advanced facet degenerative disease at this level appears worse on the left.  A large synovial cyst off the posterior, inferior aspect of the left facet joint is again noted.  2. Small synovial cysts off the anterior aspects of the L5-S1 facet joints are located within the foramina. The cyst on the left is larger causing mild to moderate foraminal narrowing.  3. Mild to moderate central canal narrowing L3-4 disc bulge ligamentum flavum thickening.   Component     Latest Ref Rng 05/24/2013  Cholesterol     0 - 200 mg/dL 528  Triglycerides     0.0 - 149.0 mg/dL 41.3  HDL     >24.40 mg/dL 10.27  VLDL     0.0 - 25.3 mg/dL 66.4  LDL (calc)     0 - 99 mg/dL 87  Total CHOL/HDL Ratio      3  TSH     4.03 - 5.50 uIU/mL 0.90  Free T4     0.60 - 1.60 ng/dL 4.74     Past Medical History  Diagnosis Date  . Hypothyroid   . Depression 1990s  . Shingles May 2014    Right face    Past Surgical History  Procedure Laterality Date  . None       Medications:  Current Outpatient Prescriptions on File Prior to Visit  Medication  Sig Dispense Refill  . ALPRAZolam (NIRAVAM) 0.5 MG dissolvable tablet Take 1 tablet (0.5 mg total) by mouth 2 (two) times daily as needed for anxiety.  30 tablet  1  . atorvastatin (LIPITOR) 10 MG tablet Take half tablet daily  30 tablet  5  . buPROPion (WELLBUTRIN) 75 MG tablet Take 75 mg by mouth 2 (two) times daily.      . clopidogrel (PLAVIX) 75 MG tablet Take 1 tablet (75 mg total) by mouth daily.  90 tablet  3  . cyclobenzaprine (FLEXERIL) 10 MG tablet Take one tablet once daily  30 tablet  1  . Hydrocodone-Acetaminophen 5-300 MG TABS Take 5 mg by mouth 3 (three) times daily as needed.  30 each  0  . levothyroxine (SYNTHROID, LEVOTHROID) 112 MCG  tablet Take 1 tablet (112 mcg total) by mouth daily.  30 tablet  11  . ramelteon (ROZEREM) 8 MG tablet Take 1 tablet (8 mg total) by mouth at bedtime.  30 tablet  0  . venlafaxine (EFFEXOR) 37.5 MG tablet Take 1 tablet (37.5 mg total) by mouth 2 (two) times daily.  30 tablet  2  . lisinopril (PRINIVIL,ZESTRIL) 10 MG tablet Take 10 mg by mouth daily.       No current facility-administered medications on file prior to visit.    Allergies: No Known Allergies  Family History: Family History  Problem Relation Age of Onset  . Stroke Father     Died, 10s  . Stroke Sister     Living, 38  . Heart disease Mother     Died, 11  . Healthy Daughter     Social History: History   Social History  . Marital Status: Married    Spouse Name: N/A    Number of Children: N/A  . Years of Education: N/A   Occupational History  . Not on file.   Social History Main Topics  . Smoking status: Former Smoker    Quit date: 08/24/1966  . Smokeless tobacco: Not on file  . Alcohol Use: 0.6 oz/week    1 Shots of liquor per week     Comment: 1-2 shots of vodka day, 20 years  . Drug Use: No  . Sexual Activity: Not on file   Other Topics Concern  . Not on file   Social History Narrative   Currently works as a Scientist, clinical (histocompatibility and immunogenetics).  Lives with wife and they have two healthy daughters.    Review of Systems:  CONSTITUTIONAL: No fevers, chills, night sweats, or weight loss.   EYES: + visual changes or eye pain ENT: No hearing changes.  No history of nose bleeds.   RESPIRATORY: No cough, wheezing and shortness of breath.   CARDIOVASCULAR: Negative for chest pain, and palpitations.   GI: Negative for abdominal discomfort, blood in stools or black stools.  No recent change in bowel habits.   GU:  No history of incontinence.   MUSCLOSKELETAL: No history of joint pain or swelling.  No myalgias.   SKIN: Negative for lesions, rash, and itching.   HEMATOLOGY/ONCOLOGY: Negative for prolonged bleeding, bruising  easily, and swollen nodes.   ENDOCRINE: Negative for cold or heat intolerance, polydipsia or goiter.   PSYCH:  + depression or anxiety symptoms.   NEURO: As Above.   Vital Signs:  BP 130/62  Pulse 86  Temp(Src) 97.8 F (36.6 C)  Resp 16  Ht 5\' 8"  (1.727 m)   General Medical Exam:   General:  Well appearing, comfortable.  Eyes/ENT: see cranial nerve examination.   Neck: No masses appreciated.  Full range of motion without tenderness.  No carotid bruits. Respiratory:  Clear to auscultation, good air entry bilaterally.   Cardiac:  Regular rate and rhythm, no murmur.   Extremities:  No deformities, edema, or skin discoloration.  Skin:  Skin color, texture, turgor normal.  No vesicles seen over the right face.  NIHSS:  0/44  Neurological Exam: MENTAL STATUS including orientation to time, place, person, recent and remote memory, attention span and concentration, language, and fund of knowledge is normal.  Speech is not dysarthric.  CRANIAL NERVES: II:  No visual field defects.  Unremarkable fundi.   III-IV-VI: Pupils equal round and reactive to light.  Normal conjugate, extra-ocular eye movements in all directions of gaze.  No nystagmus.  No ptosis.   V:  Normal facial sensation.  Jaw jerk is absent.   VII:  Normal facial symmetry and movements.  Bilateral palmomental reflexes. No snout or Myerson sign. VIII:  Normal hearing and vestibular function.   IX-X:  Normal palatal movement.   XI:  Normal shoulder shrug and head rotation.   XII:  Tongue strength is slightly weak.  Normal range of motion, no deviation or fasciculation.  MOTOR:  No atrophy, fasciculations or abnormal movements.  No pronator drift.  Tone is normal.    Right Upper Extremity:    Left Upper Extremity:    Deltoid  5/5   Deltoid  5/5   Biceps  5/5   Biceps  5/5   Triceps  5/5   Triceps  5/5   Wrist extensors  5/5   Wrist extensors  5/5   Wrist flexors  5/5   Wrist flexors  5/5   Finger extensors  5/5   Finger  extensors  5/5   Finger flexors  5/5   Finger flexors  5/5   Dorsal interossei  5-/5   Dorsal interossei  5-/5   Abductor pollicis  5-/5   Abductor pollicis  5-/5   Tone (Ashworth scale)  0  Tone (Ashworth scale)  0   Right Lower Extremity:    Left Lower Extremity:    Hip flexors  5-/5   Hip flexors  5-/5   Hip extensors  5/5   Hip extensors  5/5   Knee flexors  5/5   Knee flexors  5/5   Knee extensors  5/5   Knee extensors  5/5   Dorsiflexors  5/5   Dorsiflexors  5/5   Plantarflexors  5/5   Plantarflexors  5/5   Toe extensors  5/5   Toe extensors  5/5   Toe flexors  5/5   Toe flexors  5/5   Tone (Ashworth scale)  0  Tone (Ashworth scale)  0   MSRs:  Right                                                                 Left brachioradialis 2+  brachioradialis 2+  biceps 2+  biceps 2+  triceps 2+  triceps 2+  patellar 2+  patellar 2+  ankle jerk 2+  ankle jerk 2+  Hoffman no  Hoffman no  plantar response down  plantar response down   SENSORY:  Normal and symmetric perception of  light touch, pinprick, vibration, and proprioception.  Romberg's sign absent. There is no neglect or extinction.  COORDINATION/GAIT: Normal finger-to- nose-finger and heel-to-shin.  Intact rapid alternating movements bilaterally.  Gait mildly wide based but stable. Tandem and stressed gait intact.    IMPRESSION: Mr. Mccamish is a 79 year old gentleman presenting with amaurosis fugax of the left eye.  Currently, he has a NIHSS 0.  His neurological exam is only notable for mild weakness of hip muscles bilaterally. No upper motor neuron or lateralizing findings.  Given his transient monocular vision loss, I concerned about a embolic event from either the carotid arteries, aortic arch, or heart.  I discussed the urgency of obtaining brain and vessel imaging, however the patient has made plans to leave town tomorrow morning and was requesting to have tests performed next week. I strongly urged the patient to  reconsider and to allow [place] to try to accommodate STAT testing.  He agreed to have imaging performed today.    PLAN/RECOMMENDATIONS:  1.  Continue taking plavix 75mg  and aspirin 81mg  daily 2.  STAT CT brain, CTA brain, and CTA carotids  3.  Echocardiogram with bubble study 4.  Check ESR, CRP, and HbA1c 5.  Continue lipitor 10mg  daily 6.  I explained to the patient that I do not find it safe for him to travel as stroke workup is incomplete.  Additionally, I extensively counseled him on stroke symptoms and instructed him to go to nearest Emergency Department should he develop any concerning symptoms 6.  Return to clinic in 1 week follow up   The duration of this appointment visit was 80 minutes of face-to-face time with the patient.  Greater than 50% of this time was spent in counseling, explanation of diagnosis, planning of further management, and coordination of care.   Thank you for allowing me to participate in patient's care.  If I can answer any additional questions, I would be pleased to do so.    Sincerely,    Donika K. Patel, DO   --------------------------- Addendum  CT/A brain and CTA carotids performed and showed mld atrophy and small vessel disease without acute intracranial findings, patent intracranial vessels, non stenotic atheromatous change both carotid bifurcations without dissection or soft plaque. Given no critical stenosis, it is reasonable to restart BP medications, as goal is normotension.  Patient notified of results.  Donika K. Allena Katz, DO

## 2013-06-01 NOTE — Patient Instructions (Addendum)
1.  Continue taking plavix 75mg  and aspirin 81mg  daily 2.  STAT CT brain, CTA brain, and CTA carotids are scheduled for today at 3:00pm at [facility] [address] On 3rd floor 3.  Echocardiogram with bubble study 4.  Check blood work today 5.  If you develop any new neurological symptoms, go to nearest Emergency Department 6.  1 week follow up

## 2013-06-05 ENCOUNTER — Ambulatory Visit (HOSPITAL_COMMUNITY): Payer: Medicare Other

## 2013-06-06 ENCOUNTER — Ambulatory Visit
Admission: RE | Admit: 2013-06-06 | Discharge: 2013-06-06 | Disposition: A | Discharge status: Home / Self Care | Payer: Medicare Other | Source: Ambulatory Visit | Attending: Endocrinology | Admitting: Endocrinology

## 2013-06-06 DIAGNOSIS — R339 Retention of urine, unspecified: Secondary | ICD-10-CM | POA: Diagnosis not present

## 2013-06-06 DIAGNOSIS — H532 Diplopia: Secondary | ICD-10-CM | POA: Diagnosis not present

## 2013-06-06 DIAGNOSIS — R300 Dysuria: Secondary | ICD-10-CM | POA: Diagnosis not present

## 2013-06-06 DIAGNOSIS — R269 Unspecified abnormalities of gait and mobility: Secondary | ICD-10-CM

## 2013-06-06 DIAGNOSIS — E291 Testicular hypofunction: Secondary | ICD-10-CM | POA: Diagnosis not present

## 2013-06-06 MED ORDER — GADOBENATE DIMEGLUMINE 529 MG/ML IV SOLN
18.0000 mL | Freq: Once | INTRAVENOUS | Status: AC | PRN
  Administered 2013-06-06: 18 mL via INTRAVENOUS

## 2013-06-07 ENCOUNTER — Telehealth: Payer: Self-pay | Admitting: Neurology

## 2013-06-07 NOTE — Telephone Encounter (Signed)
Called patient and discussed that MRI brain did not show any acute stroke.    Benjamin Hahn K. Allena Katz, DO

## 2013-06-07 NOTE — Progress Notes (Signed)
Quick Note:  Please let patient know that the result is: No stroke, will forward this to neurologist also ______

## 2013-06-09 ENCOUNTER — Other Ambulatory Visit: Payer: Medicare Other

## 2013-06-09 ENCOUNTER — Ambulatory Visit (HOSPITAL_COMMUNITY)
Admission: RE | Admit: 2013-06-09 | Discharge: 2013-06-09 | Disposition: A | Discharge status: Home / Self Care | Payer: Medicare Other | Source: Ambulatory Visit | Attending: Neurology | Admitting: Neurology

## 2013-06-09 ENCOUNTER — Other Ambulatory Visit: Payer: Self-pay | Admitting: *Deleted

## 2013-06-09 ENCOUNTER — Ambulatory Visit: Payer: Medicare Other | Admitting: Neurology

## 2013-06-09 ENCOUNTER — Other Ambulatory Visit: Payer: Self-pay | Admitting: Endocrinology

## 2013-06-09 DIAGNOSIS — I10 Essential (primary) hypertension: Secondary | ICD-10-CM

## 2013-06-09 DIAGNOSIS — E039 Hypothyroidism, unspecified: Secondary | ICD-10-CM

## 2013-06-09 DIAGNOSIS — G459 Transient cerebral ischemic attack, unspecified: Secondary | ICD-10-CM | POA: Diagnosis not present

## 2013-06-09 DIAGNOSIS — I519 Heart disease, unspecified: Secondary | ICD-10-CM | POA: Diagnosis not present

## 2013-06-09 DIAGNOSIS — H3400 Transient retinal artery occlusion, unspecified eye: Secondary | ICD-10-CM | POA: Insufficient documentation

## 2013-06-09 DIAGNOSIS — E785 Hyperlipidemia, unspecified: Secondary | ICD-10-CM

## 2013-06-09 NOTE — Progress Notes (Signed)
  Echocardiogram 2D Echocardiogram has been performed.  Arvil Chaco 06/09/2013, 5:13 PM

## 2013-06-12 ENCOUNTER — Emergency Department (HOSPITAL_COMMUNITY)
Admission: EM | Admit: 2013-06-12 | Discharge: 2013-06-12 | Disposition: A | Discharge status: Home / Self Care | Payer: Medicare Other | Attending: Emergency Medicine | Admitting: Emergency Medicine

## 2013-06-12 ENCOUNTER — Other Ambulatory Visit: Payer: Self-pay

## 2013-06-12 ENCOUNTER — Ambulatory Visit (INDEPENDENT_AMBULATORY_CARE_PROVIDER_SITE_OTHER): Payer: Medicare Other | Admitting: Neurology

## 2013-06-12 ENCOUNTER — Telehealth: Payer: Self-pay | Admitting: Neurology

## 2013-06-12 ENCOUNTER — Emergency Department (HOSPITAL_COMMUNITY): Payer: Medicare Other

## 2013-06-12 ENCOUNTER — Encounter (HOSPITAL_COMMUNITY): Payer: Self-pay | Admitting: Emergency Medicine

## 2013-06-12 VITALS — BP 130/70 | HR 84 | Temp 98.2°F | Resp 14 | Ht 68.0 in

## 2013-06-12 DIAGNOSIS — R42 Dizziness and giddiness: Secondary | ICD-10-CM

## 2013-06-12 DIAGNOSIS — Z7982 Long term (current) use of aspirin: Secondary | ICD-10-CM | POA: Insufficient documentation

## 2013-06-12 DIAGNOSIS — F329 Major depressive disorder, single episode, unspecified: Secondary | ICD-10-CM | POA: Diagnosis not present

## 2013-06-12 DIAGNOSIS — H547 Unspecified visual loss: Secondary | ICD-10-CM | POA: Diagnosis not present

## 2013-06-12 DIAGNOSIS — G453 Amaurosis fugax: Secondary | ICD-10-CM

## 2013-06-12 DIAGNOSIS — R51 Headache: Secondary | ICD-10-CM | POA: Diagnosis not present

## 2013-06-12 DIAGNOSIS — G459 Transient cerebral ischemic attack, unspecified: Secondary | ICD-10-CM

## 2013-06-12 DIAGNOSIS — E039 Hypothyroidism, unspecified: Secondary | ICD-10-CM | POA: Insufficient documentation

## 2013-06-12 DIAGNOSIS — H543 Unqualified visual loss, both eyes: Secondary | ICD-10-CM | POA: Diagnosis not present

## 2013-06-12 DIAGNOSIS — H3400 Transient retinal artery occlusion, unspecified eye: Secondary | ICD-10-CM

## 2013-06-12 DIAGNOSIS — F3289 Other specified depressive episodes: Secondary | ICD-10-CM | POA: Insufficient documentation

## 2013-06-12 DIAGNOSIS — Z8619 Personal history of other infectious and parasitic diseases: Secondary | ICD-10-CM | POA: Insufficient documentation

## 2013-06-12 DIAGNOSIS — H53133 Sudden visual loss, bilateral: Secondary | ICD-10-CM

## 2013-06-12 DIAGNOSIS — Z87891 Personal history of nicotine dependence: Secondary | ICD-10-CM | POA: Insufficient documentation

## 2013-06-12 DIAGNOSIS — Z79899 Other long term (current) drug therapy: Secondary | ICD-10-CM | POA: Insufficient documentation

## 2013-06-12 DIAGNOSIS — H53139 Sudden visual loss, unspecified eye: Secondary | ICD-10-CM | POA: Diagnosis not present

## 2013-06-12 LAB — BASIC METABOLIC PANEL
BUN: 17 mg/dL (ref 6–23)
CO2: 27 mEq/L (ref 19–32)
Chloride: 97 mEq/L (ref 96–112)
Creatinine, Ser: 1.21 mg/dL (ref 0.50–1.35)
GFR calc Af Amer: 64 mL/min — ABNORMAL LOW (ref >90)
GFR calc non Af Amer: 56 mL/min — ABNORMAL LOW (ref >90)
Glucose, Bld: 90 mg/dL (ref 70–99)
Potassium: 4.1 mEq/L (ref 3.5–5.1)

## 2013-06-12 LAB — CBC
HCT: 40.8 % (ref 39.0–52.0)
Hemoglobin: 13.7 g/dL (ref 13.0–17.0)
MCH: 31.7 pg (ref 26.0–34.0)
MCV: 94.4 fL (ref 78.0–100.0)
RBC: 4.32 MIL/uL (ref 4.22–5.81)
RDW: 13.1 % (ref 11.5–15.5)
WBC: 13.5 10*3/uL — ABNORMAL HIGH (ref 4.0–10.5)

## 2013-06-12 MED ORDER — MORPHINE SULFATE 4 MG/ML IJ SOLN
4.0000 mg | Freq: Once | INTRAMUSCULAR | Status: AC
  Administered 2013-06-12: 4 mg via INTRAVENOUS
  Filled 2013-06-12: qty 1

## 2013-06-12 MED ORDER — ONDANSETRON HCL 4 MG/2ML IJ SOLN
4.0000 mg | Freq: Once | INTRAMUSCULAR | Status: AC
  Administered 2013-06-12: 4 mg via INTRAVENOUS
  Filled 2013-06-12: qty 2

## 2013-06-12 MED ORDER — ASPIRIN EC 325 MG PO TBEC: 325.0000 mg | DELAYED_RELEASE_TABLET | Freq: Every day | ORAL | Status: DC

## 2013-06-12 NOTE — ED Notes (Signed)
Called lab who is able to add sedimentation rate to available blood per EDP request.

## 2013-06-12 NOTE — ED Notes (Signed)
Pt arrives via [facility] from eye doctor. EMS reports pt was being treated at eye doctor for blind spots in right eye x10 days. While at eye doctor pt reports 10 min of right eye blindness. At 1141 vision returned. Last seen normal was 1130. EMS reports no weakness in extremities, no asymmetrical facial droop. Pt  complains of 6/10 frontal headache en route and now reports headache getting worse.

## 2013-06-12 NOTE — ED Provider Notes (Signed)
CSN: [id number]     Arrival date & time 06/12/13  1212 History   First MD Initiated Contact with Patient 06/12/13 1246     Chief Complaint  Patient presents with  . Loss of Vision  . Headache   (Consider location/radiation/quality/duration/timing/severity/associated sxs/prior Treatment) HPI Comments: Benjamin Hahn is a 79 y.o. male who is here for evaluation of bilateral blindness, that started suddenly about. He was at his neurologist office, for an appointment. His neurologist, came to his room and reassessed him. She had been seeing him for alternating vision loss in each eye, ongoing for 3 weeks. He has been evaluated comprehensively as an OP, and has planned testing of TEE and Holter monitoring, ordered. His testing has included EKGs, carotid Doppler, brain, MRI, assessment by ophthalmology, and management by neurology. He was placed on Plavix, but could not tolerate it. He was only on it for 4 days. He is taking his other medications, as prescribed. 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 Today, at the neurologist office, his lateral vision, loss was transient and lasted about 15 minutes, with the right eye improving first. There has been no recurrence. He presents here for evaluation, by EMS. He has a mild, diffuse headache. There is no neck or back pain. There are no other recent illnesses. There are no known sick contacts. He had a fall several days ago, during the night, when walking; that has been attributed to tripping on his pajamas. There was no injury in the fall. No other known modifying factors.   Patient is a 79 y.o. male presenting with headaches. The history is provided by the patient.  Headache   Past Medical History  Diagnosis Date  . Hypothyroid   . Depression 1990s  . Shingles May 2014    Right face   Past Surgical History  Procedure Laterality Date  . None     Family History  Problem Relation Age of Onset  . Stroke Father     Died, 75s  . Stroke Sister    Living, 68  . Heart disease Mother     Died, 64  . Healthy Daughter    History  Substance Use Topics  . Smoking status: Former Smoker    Quit date: 08/24/1966  . Smokeless tobacco: Not on file  . Alcohol Use: 0.6 oz/week    1 Shots of liquor per week     Comment: 1-2 shots of vodka day, 20 years    Review of Systems  Neurological: Positive for headaches.  All other systems reviewed and are negative.    Allergies  Review of patient's allergies indicates no known allergies.  Home Medications   Current Outpatient Rx  Name  Route  Sig  Dispense  Refill  . ALPRAZolam (NIRAVAM) 0.5 MG dissolvable tablet   Oral   Take 1 tablet (0.5 mg total) by mouth 2 (two) times daily as needed for anxiety.   30 tablet   1   . aspirin EC 325 MG tablet   Oral   Take 1 tablet (325 mg total) by mouth daily.   30 tablet   0   . atorvastatin (LIPITOR) 10 MG tablet      Take half tablet daily   30 tablet   5   . buPROPion (WELLBUTRIN) 75 MG tablet   Oral   Take 75 mg by mouth daily.          . cyclobenzaprine (FLEXERIL) 10 MG tablet      Take  one tablet once daily   30 tablet   1   . doxazosin (CARDURA) 4 MG tablet   Oral   Take 4 mg by mouth at bedtime.         [name] levothyroxine (SYNTHROID, LEVOTHROID) 112 MCG tablet   Oral   Take 1 tablet (112 mcg total) by mouth daily.   30 tablet   11     Dispense as written.   [name] lisinopril (PRINIVIL,ZESTRIL) 10 MG tablet   Oral   Take 10 mg by mouth daily.         [name] oxyCODONE-acetaminophen (PERCOCET/ROXICET) 5-325 MG per tablet   Oral   Take 1 tablet by mouth every 6 (six) hours as needed. For pain         . ramelteon (ROZEREM) 8 MG tablet   Oral   Take 1 tablet (8 mg total) by mouth at bedtime.   30 tablet   0   . testosterone cypionate (DEPOTESTOTERONE CYPIONATE) 200 MG/ML injection   Intramuscular   Inject 200 mg into the muscle every 14 (fourteen) days.         [name] venlafaxine (EFFEXOR) 37.5 MG tablet   Oral    Take 1 tablet (37.5 mg total) by mouth 2 (two) times daily.   30 tablet   2   . venlafaxine (EFFEXOR) 37.5 MG tablet   Oral   Take 37.5 mg by mouth 2 (two) times daily.          BP 130/67  Pulse 85  Temp(Src) 98.9 F (37.2 C) (Oral)  Resp 18  Ht 5\' 8"  (1.727 m)  Wt 193 lb (87.544 kg)  BMI 29.35 kg/m2  SpO2 94% Physical Exam  Nursing note and vitals reviewed. Constitutional: He is oriented to person, place, and time. He appears well-developed.  Elderly, frail  HENT:  Head: Normocephalic and atraumatic.  Right Ear: External ear normal.  Left Ear: External ear normal.  Eyes: Conjunctivae and EOM are normal. Pupils are equal, round, and reactive to light.  Neck: Normal range of motion and phonation normal. Neck supple.  Cardiovascular: Normal rate, regular rhythm, normal heart sounds and intact distal pulses.   Pulmonary/Chest: Effort normal and breath sounds normal. No respiratory distress. He has no wheezes. He exhibits no tenderness and no bony tenderness.  Abdominal: Soft. Normal appearance. There is no tenderness.  Musculoskeletal: Normal range of motion.  Neurological: He is alert and oriented to person, place, and time. No cranial nerve deficit or sensory deficit. He exhibits normal muscle tone. Coordination normal.  No dysarthria, no aphasia, no nystagmus, no ataxia, no dysmetria.  Skin: Skin is warm, dry and intact.  Psychiatric: He has a normal mood and affect. His behavior is normal. Judgment and thought content normal.    ED Course  Procedures (including critical care time)  Medications  morphine 4 MG/ML injection 4 mg (4 mg Intravenous Given 06/12/13 1422)  ondansetron (ZOFRAN) injection 4 mg (4 mg Intravenous Given 06/12/13 1419)    Patient Vitals for the past 24 hrs:  BP Temp Temp src Pulse Resp SpO2 Height Weight  06/12/13 1546 130/67 mmHg 98.9 F (37.2 C) Oral 85 18 94 % - -  06/12/13 1500 110/69 mmHg - - 89 15 95 % - -  06/12/13 1445 111/63 mmHg - -  87 11 97 % - -  06/12/13 1424 - 98 F (36.7 C) - - - - - -  06/12/13 1330 128/69 mmHg - - 85 18 96 % - -  06/12/13 1245 134/69 mmHg - - 85 18 96 % - -  06/12/13 1231 128/65 mmHg 98.6 F (37 C) Oral - 18 96 % 5\' 8"  (1.727 m) 193 lb (87.544 kg)  06/12/13 1230 118/63 mmHg - - 88 21 96 % - -  06/12/13 1213 - 98.6 F (37 C) Oral - - - - -   Consultation: I discussed the case with his neurologist, Dr. Allena Katz, saw him in the office today. We discussed treatment options, including observation in the hospital, versus home monitoring, that could proceed at the patient's desire. He has a scheduled followup for cardiac electron monitoring, and ultrasound imaging. She would like to have a sedimentation rate done to evaluate for the possibility of temporal arteritis. In the emergency department today the patient admits to a mild headache, which is diffuse.  15:20- I discussed the findings with the patient and his wife. I offered them an admission for monitoring. Patient would like to be discharged and followup with the current treatment plan, as an outpatient. A sedimentation rate was ordered, and the patient was discharged prior to its return. He understands that this is pending, and his neurologist, will need to followup on it. All findings were discussed with the patient, and wife, and questions answered.    Date: 06/12/13  Rate: 86  Rhythm: normal sinus rhythm  QRS Axis: normal  PR and QT Intervals: normal  ST/T Wave abnormalities: normal  PR and QRS Conduction Disutrbances:none  Narrative Interpretation:   Old EKG Reviewed: none available      Labs Review Labs Reviewed  CBC - Abnormal; Notable for the following:    WBC 13.5 (*)    All other components within normal limits  BASIC METABOLIC PANEL - Abnormal; Notable for the following:    Sodium 134 (*)    GFR calc non Af Amer 56 (*)    GFR calc Af Amer 64 (*)    All other components within normal limits  SEDIMENTATION RATE   Imaging  Review Ct Head Wo Contrast  06/12/2013   CLINICAL DATA:  Right eye visual loss. Headache.  EXAM: CT HEAD WITHOUT CONTRAST  TECHNIQUE: Contiguous axial images were obtained from the base of the skull through the vertex without intravenous contrast.  COMPARISON:  06/06/2013 MR. 06/01/2013 CT.  FINDINGS: No intracranial hemorrhage.  Small vessel disease type changes without CT evidence of large acute infarct.  Atrophy with ventricular prominence stable.  No intracranial mass lesion noted on this unenhanced exam.  Vascular calcifications.  Visualized aspects of the orbits unremarkable.  IMPRESSION: No intracranial hemorrhage or CT evidence of large acute infarct. Please see above.   Electronically Signed   By: Bridgett Larsson M.D.   On: 06/12/2013 14:03    EKG Interpretation   None       MDM   1. Vision, loss, sudden, bilateral     Nonspecific transient vision loss, evaluation in process, as an outpatient. Patient prefers to be discharged to continue this treatment plan. Per discussion with Dr. Allena Katz, additional episodes of transient vision loss would not require treatment with thrombolytics, or other typical aggressive therapies. The patient will be taken aspirin, once each day, full-strength. The patient will return here if he has additional symptoms or other concerns.   Nursing Notes Reviewed/ Care Coordinated, and agree without changes. Applicable Imaging Reviewed.  Interpretation of Laboratory Data incorporated into ED treatment    Plan: Home Medications- usual; Home Treatments and Observation- rest, no driving; return here if  the recommended treatment, does not improve the symptoms; Recommended follow up- neurology and PCP, as scheduled      Flint Melter, MD 06/12/13 [phone]

## 2013-06-12 NOTE — Patient Instructions (Addendum)
1.  Start taking aspirin 325mg  daily 2.  Cardiac monitoring 3.  EKG is scheduled for October 22nd at 10am at [facility].  4.  Restart lisinopril 5mg  daily (one-half tablet) 5.  Return to clinic 2-3 weeks

## 2013-06-12 NOTE — Telephone Encounter (Signed)
Pt has gone home, will p/u heart monitor tomorrow. Has questions, please call / Sherri

## 2013-06-12 NOTE — Progress Notes (Signed)
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 He was last seen on the clinic on 06/01/2013.  The patient was accompanied to the clinic by wife.   He continues to have intermittent spells of transient vision loss predominantly affecting the left eye but also the right. Right-sided symptoms are new.  He stopped plavix 75mg  two nights ago because over the past week, he felt that is face getting swollen.  He continues to take aspirin 81 mg daily.  A few nights ago, he tripped on his pajamas going into the bathroom over night, but was unable to get himself back up independently which concerned his wife. He said that he felt weak. This has not occurred again. He no longer spells of double vision.  Denies any headaches, changes in speech, swallowing difficulty, chest pain, shortness of breath, numbness/tingling, or limb weakness.   History of present illness: Over the first week of October 2014, he developed sudden vision loss in his left eye, described as a "shade being pulled over". Symptoms last about 2-3 minutes, occurring daily, but only at night.  There is associated diplopia with objects on top of each other, lasting seconds.  He had intermittent slurred and stuttering of speech, but this occurs independent of vision symptoms.  He saw Dr. Lucianne Muss for the above complaints on 10/1 and was started on plavix 75mg  daily. He was already taking aspirin 81 mg and Lipitor 10 mg daily. He saw Dr. Nile Riggs, his opthalmologist, who did not see any abnormalities on funduscopic examination.   He has history of shingles in July 2014 involving the right side of the face (V1-V2). He has since  developed bilateral eye pain. He had a dilated eye exam which did not reveal any involvement of the eye. He was treated valcyclovir.  Medications:  Current Outpatient Prescriptions on File Prior to Visit  Medication Sig Dispense Refill  . ALPRAZolam (NIRAVAM) 0.5 MG dissolvable tablet Take 1 tablet (0.5 mg total) by mouth 2 (two) times daily as needed for anxiety.  30 tablet  1  . aspirin 81 MG tablet Take 81 mg by mouth daily.      [name] atorvastatin (LIPITOR) 10 MG tablet Take half tablet daily  30 tablet  5  . cyclobenzaprine (FLEXERIL) 10 MG tablet Take one tablet once daily  30 tablet  1  . Hydrocodone-Acetaminophen 5-300 MG TABS Take 5 mg by mouth 3 (three) times daily as needed.  30 each  0  . levothyroxine (SYNTHROID, LEVOTHROID) 112 MCG tablet Take 1 tablet (112 mcg total) by mouth daily.  30 tablet  11  . ramelteon (ROZEREM) 8 MG tablet Take 1 tablet (8 mg total) by mouth at bedtime.  30 tablet  0  . venlafaxine (EFFEXOR) 37.5 MG tablet Take 1 tablet (37.5 mg total) by mouth 2 (two) times daily.  30 tablet  2  . buPROPion (WELLBUTRIN) 75 MG tablet Take 75 mg by mouth 2 (two) times daily.      [name] lisinopril (PRINIVIL,ZESTRIL) 10 MG tablet Take 10 mg by mouth daily.       No current facility-administered medications on file prior to visit.    Allergies: No Known Allergies   Review of Systems:  CONSTITUTIONAL: No fevers, chills,  night sweats, or weight loss.   EYES: + visual changes, no eye pain ENT: No hearing changes.  No history of nose bleeds.   RESPIRATORY: No cough, wheezing and shortness of breath.   CARDIOVASCULAR: Negative for chest pain, and palpitations.   GI: Negative for abdominal discomfort, blood in stools or black stools.  No recent change in bowel habits.   GU:  No history of incontinence.   MUSCLOSKELETAL: No history of joint pain or swelling.  + myalgias.   SKIN: Negative for lesions, rash, and itching.   HEMATOLOGY/ONCOLOGY: Negative for prolonged bleeding,  bruising easily, and swollen nodes.     ENDOCRINE: Negative for cold or heat intolerance, polydipsia or goiter.   PSYCH:  No depression or anxiety symptoms.   NEURO: As Above.   Vital Signs:  BP 130/70  Pulse 84  Temp(Src) 98.2 F (36.8 C)  Resp 14  Ht 5\' 8"  (1.727 m)    Neurological Exam: MENTAL STATUS including orientation to time, place, person, recent and remote memory, attention span and concentration, language, and fund of knowledge is normal.  Speech is not dysarthric.  CRANIAL NERVES: II:  No visual field defects.  Unremarkable fundi.   III-IV-VI: Pupils equal round and reactive to light.  Incomplete lateral gaze bilaterally with severely limited upward gaze bilaterally.   No nystagmus.  No ptosis.   V:  Normal facial sensation.   VII:  Normal facial symmetry and movements.   VIII:  Normal hearing and vestibular function.   IX-X:  Normal palatal movement.   XI:  Normal shoulder shrug and head rotation.   XII:  Normal tongue strength and range of motion, no deviation or fasciculation.  MOTOR:  No atrophy, fasciculations or abnormal movements.  No pronator drift.  Tone is normal.    Right Upper Extremity:    Left Upper Extremity:    Deltoid  5/5   Deltoid  5/5   Biceps  5/5   Biceps  5/5   Triceps  5/5   Triceps  5/5   Wrist extensors  5/5   Wrist extensors  5/5   Wrist flexors  5/5   Wrist flexors  5/5   Finger extensors  5/5   Finger extensors  5/5   Finger flexors  5/5   Finger flexors  5/5   Dorsal interossei  5-/5   Dorsal interossei  5-/5   Abductor pollicis  5-/5   Abductor pollicis  5-/5   Tone (Ashworth scale)  0  Tone (Ashworth scale)  0   Right Lower Extremity:    Left Lower Extremity:    Hip flexors  5/5   Hip flexors  5/5   Hip extensors  5/5   Hip extensors  5/5   Knee flexors  5/5   Knee flexors  5/5   Knee extensors  5/5   Knee extensors  5/5   Dorsiflexors  5/5   Dorsiflexors  5/5   Plantarflexors  5/5   Plantarflexors  5/5   Toe extensors  5/5    Toe extensors  5/5   Toe flexors  5/5   Toe flexors  5/5   Tone (Ashworth scale)  0  Tone (Ashworth scale)  0   MSRs:  Right  Left brachioradialis 2+  brachioradialis 2+  biceps 2+  biceps 2+  triceps 2+  triceps 2+  patellar 2+  patellar 2+  ankle jerk 2+  ankle jerk 2+  Hoffman no  Hoffman no  plantar response down  plantar response down   SENSORY:  Normal and symmetric perception of light touch  COORDINATION/GAIT: Normal finger-to- nose-finger and heel-to-shin.  Intact rapid alternating movements bilaterally.  Gait slightly wide-based and stable.   Data: MRI brain wwo contrast 06/08/2013:  Atrophy and chronic ischemia. No acute abnormality. CTA carotids and circle of willis 06/01/2013:  Unremarkable CTA head. Mild atrophy and small vessel disease without acute intracranial findings. Non stenotic atheromatous change both carotid bifurcations. No dissection or soft plaque.  Left thyroid nodule 10 x 11 mm. Consider ultrasound of the thyroid for further evaluation.  Echocardiogram with bubble 06/09/2013:  Normal LV size and systolic function, EF 55-60%. Normal RV size and systolic function. No significant valvular abnormalities. Negative bubble study.  Component     Latest Ref Rng 05/24/2013 06/01/2013  Cholesterol     0 - 200 mg/dL 161   Triglycerides     0.0 - 149.0 mg/dL 09.6   HDL     >04.54 mg/dL 09.81   VLDL     0.0 - 40.0 mg/dL 19.1   LDL (calc)     0 - 99 mg/dL 87   Total CHOL/HDL Ratio      3   TSH     0.35 - 5.50 uIU/mL 0.90   Free T4     0.60 - 1.60 ng/dL 4.78   CRP     0.5 - 29.5 mg/dL  5.1  Hemoglobin A2Z     4.6 - 6.5 %  6.1  Sed Rate     0 - 22 mm/hr  41 (H)    IMPRESSION: Mr. Moffa is a 80 year old gentleman presenting with transient vision loss (L >R).  Stroke risk factors:  HTN (controlled), age, HPL (LDL 87), TIA.  ABCD2:  5  By the end of the interview, I had discussed work-up  performed thus far, including reviewing the images with the patient and his wife.  His MRI brain did not show any acute stroke, vessel imaging notable for nonstenositic atheromatous changes at the carotid bifurcations, and essentially normal echocardiogram.  The images have been reviewed personally with patient and his wife.  Given that he continues to have symptoms most consistent with TIAs, I would like to treat him with dual antiplatelets in the acute period, but he did not tolerate Plavix so stopped it himself a few days ago.  I discussed that my concern is whether he is having cardioembolic events, but it is atypical so stereotyped especially with patent vessels as embolic events are random.  I would like to obtain TEE and cardiac monitoring (Holter).  As far as secondary prevention, I will keep him on aspirin and even though there is no data that support that the dose of aspirin is no more superior than low-dose ASA, he would prefer to increase to full dose.    His wife reported that he had shingles in the summer and was concerned if this was related.  I explained that VZV vasculitis is probably unlikely, especially with normal vessel imaging.  Giant cell arteritis, however, is in the differential, but ESR was 41, CRP was normal, and he has no history of headaches.    As the patient was preparing to check out of the office, he developed  sudden onset of bilateral vision loss.  I came to assess the patient at approximately 11:30am and he was complaining of total blindness in both eyes.  I assessed each eye separately and he was unable to see my hands or even the light from my penlight.  No associated muscle weakness, sensory changes, or dysarthria.  No facial weakness, drift, or ataxia on exam.  EMS was called and by the time they arrived ~11:45am, his vision of his right eye had returned, but he continued to have no vision of the left.  A few minutes later,  left sided symptoms also started to improve.  He  was taken to the ED for evaluation.  It is unusual for even TIA to develop binocular blindness.     PLAN/RECOMMENDATIONS:  1.  Transferred patient to ED via EMS 2.  Plan to obtain TEE, telemetry (holter), EKG, recheck ESR 3.  Start ASA 325mg  daily 4.  Restart lisinopril 5mg  daily 5.  Return to clinic in 1-2 weeks  The duration of this appointment visit was 85 minutes of face-to-face time with the patient, with greater than 20 min spent performing acute critical care management of stroke symptoms.  Greater than 50% of this time was spent in counseling, explanation of diagnosis, planning of further management, and coordination of care.   Thank you for allowing me to participate in patient's care.  If I can answer any additional questions, I would be pleased to do so.    Sincerely,    Rubye Strohmeyer K. Allena Katz, DO

## 2013-06-13 ENCOUNTER — Telehealth: Payer: Self-pay | Admitting: *Deleted

## 2013-06-13 ENCOUNTER — Encounter: Payer: Self-pay | Admitting: Neurology

## 2013-06-13 NOTE — Telephone Encounter (Signed)
Left message for pt to call back if they still have questions.

## 2013-06-13 NOTE — Telephone Encounter (Signed)
Dr. Noberto Retort. [facility] requested for this office, to  Scheduled a TEE for pt . I  Left pt a message to call back to scheduled procedure.

## 2013-06-13 NOTE — Telephone Encounter (Signed)
Left pt a message to call back. 

## 2013-06-14 ENCOUNTER — Telehealth: Payer: Self-pay | Admitting: *Deleted

## 2013-06-14 ENCOUNTER — Telehealth: Payer: Self-pay | Admitting: Endocrinology

## 2013-06-14 ENCOUNTER — Encounter: Payer: Self-pay | Admitting: Endocrinology

## 2013-06-14 ENCOUNTER — Ambulatory Visit (INDEPENDENT_AMBULATORY_CARE_PROVIDER_SITE_OTHER): Payer: Medicare Other | Admitting: Endocrinology

## 2013-06-14 VITALS — BP 116/68 | HR 82 | Temp 98.3°F | Resp 12 | Ht 68.0 in | Wt 193.7 lb

## 2013-06-14 DIAGNOSIS — G453 Amaurosis fugax: Secondary | ICD-10-CM

## 2013-06-14 DIAGNOSIS — H3400 Transient retinal artery occlusion, unspecified eye: Secondary | ICD-10-CM

## 2013-06-14 DIAGNOSIS — M316 Other giant cell arteritis: Secondary | ICD-10-CM | POA: Diagnosis not present

## 2013-06-14 MED ORDER — TAMSULOSIN HCL 0.4 MG PO CAPS: 0.4000 mg | ORAL_CAPSULE | Freq: Every day | ORAL | Status: DC

## 2013-06-14 MED ORDER — PREDNISONE 20 MG PO TABS: 60.0000 mg | ORAL_TABLET | Freq: Every day | ORAL | Status: DC

## 2013-06-14 NOTE — Telephone Encounter (Signed)
Pt has an appt today at 3, he wanted you to know that he has intermittent loss of sight in both eyes.

## 2013-06-14 NOTE — Telephone Encounter (Signed)
noted 

## 2013-06-14 NOTE — Patient Instructions (Signed)
Start prednisone 60 mg with breakfast daily, may start first dose today  Stop lisinopril and doxazosin  Start Flomax at bedtime for prostate symptoms  You will be scheduled for temporal artery biopsy by general surgeon

## 2013-06-14 NOTE — Telephone Encounter (Signed)
Pt would like a return call from [facility] prior to today's appt at 300pm w/ Dr. Lucianne Muss. / Roanna Raider

## 2013-06-14 NOTE — Telephone Encounter (Signed)
Called pt left a message to call back so we can scheduled a TEE that Dr. Allena Katz wants for this office to scheduled. NOTE: Pt was scheduled in this office for a nurse visit for an EKG this AM. Pt did not come for the appointment.

## 2013-06-14 NOTE — Progress Notes (Signed)
  Subjective:     Patient ID: Benjamin Hahn, male   DOB: [birth year], 79 y.o.   MRN: [id number]  HPI  On his last visit on 10/1 he had symptoms of difficulty thinking clearly and was also having some diplopia and difficulty with gait Subsequently he has had episodes of visual loss, starting in the left eye first and then subsequently bilateral Since Monday he has had these episodes daily lasting about 10 minutes He says that he cannot see anything for at least 5 minutes and had an episode today also His vision recovers completely after these episodes and is fine at the moment He was evaluated in the emergency room on Monday when he was sent by the neurologist but no etiology found He has been seen by his ophthalmologist last month and his exam had been normal  On questioning the patient says that he has had mild headaches for the last 10 days which he cannot describe accurately but he thinks these are similar to when he had shingles. He did not have any trouble chewing No diplopia or difficulty with gait recently   Currently  taking  aspirin  Review of Systems   He has been taking a half lisinopril daily even though he was told to stop it for his history of mild hypertension No complaints of orthostatic lightheadedness  He has had long-standing history of depression, insomnia and previous history of fibromyalgia  He has started taking doxazosin today because he felt that his prostate symptoms are coming back  He had had herpes zoster on the right trigeminal early this year and has no further neurologic pains     Objective:   Physical Exam BP 116/68  Pulse 82  Temp(Src) 98.3 F (36.8 C)  Resp 12  Ht 5\' 8"  (1.727 m)  Wt 193 lb 11.2 oz (87.862 kg)  BMI 29.46 kg/m2  SpO2 96% He has tenderness of his right temporal area but not the left     Assessment:     Repeated episodes of amaurosis fugax, now mostly bilateral and also relatively transient Nonspecific  headaches Tenderness of right temporal artery Negative extensive neurological and cardiac workup Since his sedimentation is increased without any evidence of systemic disease he most likely has temporal arteritis Have discussed this possibility with the neurologist today and will proceed with workup and treatment  Low normal blood pressure today     Plan:     Schedule for temporal artery biopsy, referral made and he will be seen on Friday Prednisone 60 mg a day for 2 weeks Continue aspirin 325 mg daily Repeat ESR in 2 weeks Continue followup with neurologist Defer decision for any anticoagulation to neurologist Consider rheumatology or other specialist referral for further management  Stop lisinopril and doxazosin   He will try Flomax for his BPH symptoms

## 2013-06-15 ENCOUNTER — Telehealth: Payer: Self-pay | Admitting: *Deleted

## 2013-06-15 DIAGNOSIS — H2510 Age-related nuclear cataract, unspecified eye: Secondary | ICD-10-CM | POA: Diagnosis not present

## 2013-06-15 NOTE — Telephone Encounter (Signed)
Pts wife called, they saw Dr. Nile Riggs today and he wanted to know if they checked the Vertebral artery on the MRI?  She also said he's on prednisone now and wants to know how long it would take to see if it's actually working? Please advise,

## 2013-06-15 NOTE — Telephone Encounter (Signed)
Should discuss MRI and other testing with neurologist, Prednisone should help within 1 week

## 2013-06-16 ENCOUNTER — Ambulatory Visit (INDEPENDENT_AMBULATORY_CARE_PROVIDER_SITE_OTHER): Payer: Medicare Other | Admitting: Surgery

## 2013-06-16 ENCOUNTER — Encounter (INDEPENDENT_AMBULATORY_CARE_PROVIDER_SITE_OTHER): Payer: Self-pay | Admitting: Surgery

## 2013-06-16 ENCOUNTER — Other Ambulatory Visit (INDEPENDENT_AMBULATORY_CARE_PROVIDER_SITE_OTHER): Payer: Self-pay | Admitting: Surgery

## 2013-06-16 VITALS — BP 130/82 | HR 96 | Temp 99.3°F | Resp 15 | Ht 68.5 in | Wt 193.0 lb

## 2013-06-16 DIAGNOSIS — M316 Other giant cell arteritis: Secondary | ICD-10-CM | POA: Diagnosis not present

## 2013-06-16 NOTE — Patient Instructions (Signed)
Temporal Artery Biopsy Blood vessels that carry blood from the heart to all parts of the body are called arteries. Arteries in your temples (the area on the side of the head, between the ears and eyes) are called temporal arteries. The lining of a temporal artery can swell up. The condition is called temporal arteritis. Another term is giant cell arteritis. It usually causes a headache or pain in the temples. But it can also cause fever. Your jaws can hurt when you chew. Or, you could have trouble seeing. These symptoms could be caused by something else. The way to be sure is to do a temporal artery biopsy. A small piece of the artery is removed. Then it is checked under a microscope. LET YOUR CAREGIVER KNOW ABOUT:   Any allergies.  All medications you are taking, including:  Herbs, eyedrops, over-the-counter medications and creams.  Blood thinners (anticoagulants), aspirin or other drugs that could affect blood clotting.  Use of steroids (by mouth or as creams).  Previous problems with anesthetics, including local anesthetics.  Possibility of pregnancy, if this applies.  Any history of blood clots.  Any history of bleeding or other blood problems.  Any surgeries.  Smoking history.  Other health problems. RISKS AND COMPLICATIONS  Problems are rare with a temporal artery biopsy. However, every procedure has risks. For this type of biopsy, they include:  Bleeding that does not stop.  Hematoma. This is a lump caused by bleeding under the skin.  Pain that continues, even after taking pain medicine.  Infection.  Nerve damage in the temples. This could cause numbness. Or muscles in the face could become weak.  Scarring. On the scalp, hair may not grow around the scar. BEFORE THE PROCEDURE   A medical evaluation will be done. This may include:  A physical examination. Your caregiver will feel your temples. The temporal artery may feel hard and swollen. The caregiver may not be  able to feel a pulse (blood moving through the artery when your heart beats).  Blood tests. One test will probably check how well the blood clots (forms a clump that prevents too much bleeding). Another test checks erythrocyte sedimentation. Your caregiver may call this a sed rate test. It measures how fast red blood cells drop to the bottom of a test tube. Cells that drop fast are a sign of inflammation in the body. Almost everyone with temporal arteritis has a high sed rate.  Talking with an anesthesiologist. This is the person who will be in charge of the anesthesia (medication) during the procedure. Local anesthesia is usually used for a temporal artery biopsy. The area would be numb (no feeling). You would be drowsy but awake during the procedure.  The person who is having the biopsy needs to give what is called informed consent. This requires signing a legal paper that gives permission for the surgery. To give informed consent:  You must understand how the procedure is done and why.  You must be told all the risks and benefits of the procedure.  You must sign the consent. Sometimes a legal guardian can do this.  Signing should be witnessed by a healthcare professional.  Arrive about an hour before the procedure, or whenever your caregiver recommends. This will give you time to check in and fill out any needed paperwork.  This is almost always an outpatient procedure. That means you can go home the same day. Make plans to have someone drive you home. PROCEDURE  Preparation:  Small   monitors will be put on your body. They are used to check your heart, blood pressure and oxygen level.  You will be given an IV. A needle will be inserted in your arm. Medication will be able to flow directly into your body through this needle.  The area for the biopsy will be cleaned with a special solution to kill germs.  You will be given a local anesthetic. Sometimes, a cream is rubbed on the area  first. This makes the skin numb. Then, the anesthetic is injected (shot) into the skin. This is often done in several spots.  You also may be given a sedative. This drug will help you relax. You will probably feel very drowsy.  A pencil-like instrument (called a Doppler) may be used to find the artery. It uses sound waves to do this. Marks will be made on the skin. They will trace the path of the artery in your temple.  The procedure. It usually takes about 30 minutes.  When the area is numb, an incision (cut) will be made over the temporal artery. The incision may be 2 to 3 inches long.  Two clamps are put on the artery, about 2 inches apart. Then, a small piece of the artery between the clamps is cut and removed.  The tissue under the skin is closed with stitches. These will disappear in time. The skin then is closed with small stitches.  A small dressing (medicine and a bandage) may be put on the incision.  The artery piece that was taken out will be checked under a microscope. This will show if temporal arteritis is present. AFTER THE PROCEDURE   If you were given a sedative, you will stay in a recovery area until it has worn off. Your blood pressure and pulse will be checked. When everything is back to normal, you will be able to go home.  Some pain is normal. It is usually not severe. You may be given pain medicine while in the recovery area. You also may be given a prescription for pain medicine to take at home. Before going home, ask if it is OK to take any over-the-counter painkillers.  Be sure to ask how to care for the incision. Find out if you can get the incision wet. Find out when the dressing should be taken off.  Make an appointment to come back to have the stitches taken out.  Do not drive for at least 24 hours after the procedure. The sedative will be out of your system by then. PROGNOSIS  This procedure is not a treatment. It will not cure temporal arteritis. The  biopsy is done to help your caregiver decide if you have the condition. If you do, treatment can begin. Treatment for temporal arteritis is almost always successful. Document Released: 07/29/2009 Document Revised: 11/02/2011 Document Reviewed: 07/29/2009 [employer] Patient Information 2014 [employer].  

## 2013-06-16 NOTE — Progress Notes (Signed)
General Surgery [facility]  Chief Complaint  Patient presents with  . New Evaluation    eval for temporal artery biopsy - referral from Dr. Reather Littler    HISTORY: Patient is a 79 year old male referred by his primary physician for evaluation of possible temporal arteritis. Patient has had a recent episode of vision loss and nonspecific headaches. He has undergone an extensive evaluation in recent days by his primary physician and his neurologist. This includes MRI, carotid duplex, and laboratory studies. Patient is now referred to general surgery for temporal artery biopsy to rule out temporal arteritis.  Past Medical History  Diagnosis Date  . Hypothyroid   . Depression 1990s  . Shingles May 2014    Right face    Current Outpatient Prescriptions  Medication Sig Dispense Refill  . ALPRAZolam (NIRAVAM) 0.5 MG dissolvable tablet Take 1 tablet (0.5 mg total) by mouth 2 (two) times daily as needed for anxiety.  30 tablet  1  . aspirin EC 325 MG tablet Take 1 tablet (325 mg total) by mouth daily.  30 tablet  0  . atorvastatin (LIPITOR) 10 MG tablet Take half tablet daily  30 tablet  5  . buPROPion (WELLBUTRIN) 75 MG tablet Take 75 mg by mouth daily.       . cyclobenzaprine (FLEXERIL) 10 MG tablet Take one tablet once daily  30 tablet  1  . fluticasone (CUTIVATE) 0.05 % cream       . imipramine (TOFRANIL) 10 MG tablet       . levothyroxine (SYNTHROID, LEVOTHROID) 112 MCG tablet Take 1 tablet (112 mcg total) by mouth daily.  30 tablet  11  . oxyCODONE-acetaminophen (PERCOCET/ROXICET) 5-325 MG per tablet Take 1 tablet by mouth every 6 (six) hours as needed. For pain      . predniSONE (DELTASONE) 20 MG tablet Take 3 tablets (60 mg total) by mouth daily.  90 tablet  3  . ramelteon (ROZEREM) 8 MG tablet Take 1 tablet (8 mg total) by mouth at bedtime.  30 tablet  0  . tamsulosin (FLOMAX) 0.4 MG CAPS capsule Take 1 capsule (0.4 mg total) by mouth daily.  30 capsule  3  .  testosterone cypionate (DEPOTESTOTERONE CYPIONATE) 200 MG/ML injection Inject 200 mg into the muscle every 14 (fourteen) days.      [name] venlafaxine (EFFEXOR) 37.5 MG tablet Take 1 tablet (37.5 mg total) by mouth 2 (two) times daily.  30 tablet  2   No current facility-administered medications for this visit.    No Known Allergies  Family History  Problem Relation Age of Onset  . Stroke Father     Died, 67s  . Stroke Sister     Living, 49  . Heart disease Mother     Died, 48  . Healthy Daughter     History   Social History  . Marital Status: Married    Spouse Name: N/A    Number of Children: N/A  . Years of Education: N/A   Social History Main Topics  . Smoking status: Former Smoker    Quit date: 08/24/1966  . Smokeless tobacco: None  . Alcohol Use: 0.6 oz/week    1 Shots of liquor per week     Comment: 1-2 shots of vodka day, 20 years  . Drug Use: No  . Sexual Activity: None   Other Topics Concern  . None   Social History Narrative   Currently works as a Scientist, clinical (histocompatibility and immunogenetics).  Lives  with wife and they have two healthy daughters.    REVIEW OF SYSTEMS - PERTINENT POSITIVES ONLY: Nonspecific headaches, recent acute vision loss, history of shingles right scalp and forehead  EXAM: Filed Vitals:   06/16/13 1320  BP: 130/82  Pulse: 96  Temp: 99.3 F (37.4 C)  Resp: 15    HEENT: normocephalic; pupils equal and reactive; sclerae clear; dentition good; mucous membranes moist; palpable bilateral temporal pulses; no tenderness over the temporal arteries bilaterally NECK:  symmetric on extension; no palpable anterior or posterior cervical lymphadenopathy; no supraclavicular masses; no tenderness CHEST: clear to auscultation bilaterally without rales, rhonchi, or wheezes CARDIAC: regular rate and rhythm without significant murmur; peripheral pulses are full EXT:  non-tender without edema; no deformity NEURO: no gross focal deficits; no sign of tremor   LABORATORY  RESULTS: See [facility] (CHL-Epic) for most recent results  RADIOLOGY RESULTS: See [facility] (CHL-Epic) for most recent results  IMPRESSION: Rule out temporal arteritis  PLAN: Patient is referred by his primary care physician for right temporal artery biopsy to rule out temporal arteritis. Patient has had headaches and recent acute vision loss. He has been started on high-dose oral steroids.  I discussed temporal artery biopsy with the patient and his wife. I explained location of the surgical incision. We will perform this as an outpatient surgical procedure as soon as possible. They understand and wish to proceed.  The risks and benefits of the procedure have been discussed at length with the patient.  The patient understands the proposed procedure, potential alternative treatments, and the course of recovery to be expected.  All of the patient's questions have been answered at this time.  The patient wishes to proceed with surgery.  Velora Heckler, MD, FACS General & Endocrine Surgery [facility]  Primary Care Physician: Reather Littler, MD

## 2013-06-19 ENCOUNTER — Encounter (HOSPITAL_COMMUNITY)
Admission: RE | Admit: 2013-06-19 | Discharge: 2013-06-19 | Disposition: A | Discharge status: Home / Self Care | Payer: Medicare Other | Source: Ambulatory Visit | Attending: Surgery | Admitting: Surgery

## 2013-06-19 ENCOUNTER — Other Ambulatory Visit: Payer: Self-pay | Admitting: *Deleted

## 2013-06-19 ENCOUNTER — Encounter (HOSPITAL_COMMUNITY): Payer: Self-pay

## 2013-06-19 ENCOUNTER — Encounter (HOSPITAL_COMMUNITY): Payer: Self-pay | Admitting: Pharmacy Technician

## 2013-06-19 DIAGNOSIS — E039 Hypothyroidism, unspecified: Secondary | ICD-10-CM | POA: Diagnosis not present

## 2013-06-19 DIAGNOSIS — Z01812 Encounter for preprocedural laboratory examination: Secondary | ICD-10-CM | POA: Diagnosis not present

## 2013-06-19 DIAGNOSIS — Z7982 Long term (current) use of aspirin: Secondary | ICD-10-CM | POA: Diagnosis not present

## 2013-06-19 DIAGNOSIS — R51 Headache: Secondary | ICD-10-CM | POA: Diagnosis not present

## 2013-06-19 DIAGNOSIS — Z79899 Other long term (current) drug therapy: Secondary | ICD-10-CM | POA: Diagnosis not present

## 2013-06-19 DIAGNOSIS — Z87891 Personal history of nicotine dependence: Secondary | ICD-10-CM | POA: Diagnosis not present

## 2013-06-19 DIAGNOSIS — H547 Unspecified visual loss: Secondary | ICD-10-CM | POA: Diagnosis not present

## 2013-06-19 HISTORY — DX: Unspecified osteoarthritis, unspecified site: M19.90

## 2013-06-19 HISTORY — DX: Gastro-esophageal reflux disease without esophagitis: K21.9

## 2013-06-19 HISTORY — DX: Transient visual loss, bilateral: H53.123

## 2013-06-19 HISTORY — DX: Anxiety disorder, unspecified: F41.9

## 2013-06-19 HISTORY — DX: Pure hypercholesterolemia, unspecified: E78.00

## 2013-06-19 LAB — CBC
Hemoglobin: 14.1 g/dL (ref 13.0–17.0)
MCH: 31.6 pg (ref 26.0–34.0)
MCHC: 33.6 g/dL (ref 30.0–36.0)
Platelets: 418 10*3/uL — ABNORMAL HIGH (ref 150–400)
RBC: 4.46 MIL/uL (ref 4.22–5.81)
WBC: 13.9 10*3/uL — ABNORMAL HIGH (ref 4.0–10.5)

## 2013-06-19 MED ORDER — BUPROPION HCL ER (SR) 150 MG PO TB12: 150.0000 mg | ORAL_TABLET | Freq: Every morning | ORAL | Status: DC

## 2013-06-19 NOTE — Patient Instructions (Addendum)
20 ZAKAI GONYEA  06/19/2013   Your procedure is scheduled on: 06/20/13  Report to [facility] Stay [facility] at 7:30 AM.  Call this number if you have problems the morning of surgery 336-: [phone]   Remember:   Do not eat food or drink liquids After Midnight.     Take these medicines the morning of surgery with A SIP OF WATER: alprazolam if needed, lipitor, wellbutrin, synthroid, prednisone, effexor   Do not wear jewelry, make-up or nail polish.  Do not wear lotions, powders, or perfumes. You may wear deodorant.  Do not shave 48 hours prior to surgery. Men may shave face and neck.  Do not bring valuables to the hospital.  Contacts, dentures or bridgework may not be worn into surgery.     Patients discharged the day of surgery will not be allowed to drive home.  Name and phone number of your driver: Clenton Pare (wife) [phone]    Birdie Sons, RN  pre op nurse call if needed [phone]    FAILURE TO FOLLOW THESE INSTRUCTIONS MAY RESULT IN CANCELLATION OF YOUR SURGERY   Patient Signature: ___________________________________________

## 2013-06-19 NOTE — Telephone Encounter (Signed)
Patient is having a temporal artery biopsy today.  Call tomorrow

## 2013-06-19 NOTE — Progress Notes (Signed)
Quick Note:  These results are acceptable for scheduled surgery.  Donnell Beauchamp M. Larayah Clute, MD, FACS [facility] Office: [phone]   ______ 

## 2013-06-19 NOTE — Progress Notes (Signed)
EKG 06/13/13 on EPIC, BMET results 06/12/13 on EPIC

## 2013-06-20 ENCOUNTER — Ambulatory Visit (HOSPITAL_COMMUNITY): Payer: Medicare Other | Admitting: Anesthesiology

## 2013-06-20 ENCOUNTER — Ambulatory Visit (HOSPITAL_COMMUNITY)
Admission: RE | Admit: 2013-06-20 | Discharge: 2013-06-20 | Disposition: A | Discharge status: Home / Self Care | Payer: Medicare Other | Source: Ambulatory Visit | Attending: Surgery | Admitting: Surgery

## 2013-06-20 ENCOUNTER — Encounter (HOSPITAL_COMMUNITY): Payer: Self-pay | Admitting: *Deleted

## 2013-06-20 ENCOUNTER — Encounter (HOSPITAL_COMMUNITY): Payer: Medicare Other | Admitting: Anesthesiology

## 2013-06-20 ENCOUNTER — Encounter (HOSPITAL_COMMUNITY)
Admission: RE | Disposition: A | Discharge status: Home / Self Care | Payer: Self-pay | Source: Ambulatory Visit | Attending: Surgery

## 2013-06-20 DIAGNOSIS — H547 Unspecified visual loss: Secondary | ICD-10-CM | POA: Insufficient documentation

## 2013-06-20 DIAGNOSIS — M316 Other giant cell arteritis: Secondary | ICD-10-CM | POA: Diagnosis not present

## 2013-06-20 DIAGNOSIS — Z79899 Other long term (current) drug therapy: Secondary | ICD-10-CM | POA: Insufficient documentation

## 2013-06-20 DIAGNOSIS — Z01812 Encounter for preprocedural laboratory examination: Secondary | ICD-10-CM | POA: Insufficient documentation

## 2013-06-20 DIAGNOSIS — E039 Hypothyroidism, unspecified: Secondary | ICD-10-CM | POA: Insufficient documentation

## 2013-06-20 DIAGNOSIS — R51 Headache: Secondary | ICD-10-CM

## 2013-06-20 DIAGNOSIS — Z87891 Personal history of nicotine dependence: Secondary | ICD-10-CM | POA: Insufficient documentation

## 2013-06-20 DIAGNOSIS — H539 Unspecified visual disturbance: Secondary | ICD-10-CM

## 2013-06-20 DIAGNOSIS — K219 Gastro-esophageal reflux disease without esophagitis: Secondary | ICD-10-CM | POA: Diagnosis not present

## 2013-06-20 DIAGNOSIS — Z7982 Long term (current) use of aspirin: Secondary | ICD-10-CM | POA: Insufficient documentation

## 2013-06-20 HISTORY — PX: ARTERY BIOPSY: SHX891

## 2013-06-20 SURGERY — BIOPSY TEMPORAL ARTERY
Anesthesia: Monitor Anesthesia Care | Site: Head | Laterality: Right

## 2013-06-20 MED ORDER — BUPIVACAINE HCL (PF) 0.5 % IJ SOLN
INTRAMUSCULAR | Status: AC
  Filled 2013-06-20: qty 30

## 2013-06-20 MED ORDER — PROMETHAZINE HCL 25 MG/ML IJ SOLN: 6.2500 mg | INTRAMUSCULAR | Status: DC | PRN

## 2013-06-20 MED ORDER — LACTATED RINGERS IV SOLN: INTRAVENOUS | Status: DC

## 2013-06-20 MED ORDER — CEFAZOLIN SODIUM-DEXTROSE 2-3 GM-% IV SOLR
2.0000 g | INTRAVENOUS | Status: AC
  Administered 2013-06-20: 2 g via INTRAVENOUS

## 2013-06-20 MED ORDER — FENTANYL CITRATE 0.05 MG/ML IJ SOLN: 25.0000 ug | INTRAMUSCULAR | Status: DC | PRN

## 2013-06-20 MED ORDER — 0.9 % SODIUM CHLORIDE (POUR BTL) OPTIME
TOPICAL | Status: DC | PRN
  Administered 2013-06-20: 1000 mL

## 2013-06-20 MED ORDER — CEFAZOLIN SODIUM-DEXTROSE 2-3 GM-% IV SOLR
INTRAVENOUS | Status: AC
  Filled 2013-06-20: qty 50

## 2013-06-20 MED ORDER — MIDAZOLAM HCL 5 MG/5ML IJ SOLN
INTRAMUSCULAR | Status: DC | PRN
  Administered 2013-06-20 (×2): 1 mg via INTRAVENOUS

## 2013-06-20 MED ORDER — FENTANYL CITRATE 0.05 MG/ML IJ SOLN
INTRAMUSCULAR | Status: DC | PRN
  Administered 2013-06-20 (×2): 50 ug via INTRAVENOUS

## 2013-06-20 MED ORDER — BUPIVACAINE HCL (PF) 0.5 % IJ SOLN
INTRAMUSCULAR | Status: DC | PRN
  Administered 2013-06-20: 6 mL

## 2013-06-20 MED ORDER — LACTATED RINGERS IV SOLN
INTRAVENOUS | Status: DC
  Administered 2013-06-20: 1000 mL via INTRAVENOUS

## 2013-06-20 MED ORDER — HYDROCODONE-ACETAMINOPHEN 5-325 MG PO TABS: 1.0000 | ORAL_TABLET | ORAL | Status: DC | PRN

## 2013-06-20 MED ORDER — BUPIVACAINE-EPINEPHRINE 0.5% -1:200000 IJ SOLN
INTRAMUSCULAR | Status: AC
  Filled 2013-06-20: qty 1

## 2013-06-20 MED ORDER — PROPOFOL INFUSION 10 MG/ML OPTIME
INTRAVENOUS | Status: DC | PRN
  Administered 2013-06-20: 50 ug/kg/min via INTRAVENOUS

## 2013-06-20 SURGICAL SUPPLY — 43 items
ATTRACTOMAT 16X20 MAGNETIC DRP (DRAPES) IMPLANT
BENZOIN TINCTURE PRP APPL 2/3 (GAUZE/BANDAGES/DRESSINGS) ×2 IMPLANT
BLADE HEX COATED 2.75 (ELECTRODE) ×2 IMPLANT
BLADE SURG 15 STRL LF DISP TIS (BLADE) ×1 IMPLANT
BLADE SURG 15 STRL SS (BLADE) ×1
BLADE SURG SZ10 CARB STEEL (BLADE) IMPLANT
CLIP TI WIDE RED SMALL 6 (CLIP) IMPLANT
CLOTH BEACON ORANGE TIMEOUT ST (SAFETY) ×2 IMPLANT
DISSECTOR ROUND CHERRY 3/8 STR (MISCELLANEOUS) IMPLANT
DRAPE PED LAPAROTOMY (DRAPES) ×2 IMPLANT
ELECT NEEDLE TIP 2.8 STRL (NEEDLE) IMPLANT
ELECT REM PT RETURN 9FT ADLT (ELECTROSURGICAL) ×2
ELECTRODE REM PT RTRN 9FT ADLT (ELECTROSURGICAL) ×1 IMPLANT
GAUZE SPONGE 2X2 8PLY STRL LF (GAUZE/BANDAGES/DRESSINGS) ×1 IMPLANT
GAUZE SPONGE 4X4 16PLY XRAY LF (GAUZE/BANDAGES/DRESSINGS) ×2 IMPLANT
GLOVE BIOGEL PI IND STRL 7.0 (GLOVE) ×1 IMPLANT
GLOVE BIOGEL PI INDICATOR 7.0 (GLOVE) ×1
GLOVE ECLIPSE 8.0 STRL XLNG CF (GLOVE) ×2 IMPLANT
GLOVE INDICATOR 8.0 STRL GRN (GLOVE) ×4 IMPLANT
GOWN PREVENTION PLUS LG XLONG (DISPOSABLE) IMPLANT
GOWN STRL REIN XL XLG (GOWN DISPOSABLE) ×4 IMPLANT
HEMOSTAT SURGICEL 2X14 (HEMOSTASIS) IMPLANT
KIT BASIN OR (CUSTOM PROCEDURE TRAY) ×2 IMPLANT
MARKER SKIN DUAL TIP RULER LAB (MISCELLANEOUS) IMPLANT
NS IRRIG 1000ML POUR BTL (IV SOLUTION) ×2 IMPLANT
PACK BASIC VI WITH GOWN DISP (CUSTOM PROCEDURE TRAY) ×2 IMPLANT
PENCIL BUTTON HOLSTER BLD 10FT (ELECTRODE) ×2 IMPLANT
SPONGE GAUZE 2X2 STER 10/PKG (GAUZE/BANDAGES/DRESSINGS) ×1
SPONGE GAUZE 4X4 12PLY (GAUZE/BANDAGES/DRESSINGS) IMPLANT
STAPLER VISISTAT 35W (STAPLE) IMPLANT
STRIP CLOSURE SKIN 1/2X4 (GAUZE/BANDAGES/DRESSINGS) ×2 IMPLANT
SUT SILK 2 0 (SUTURE) ×1
SUT SILK 2-0 18XBRD TIE 12 (SUTURE) ×1 IMPLANT
SUT SILK 3 0 (SUTURE)
SUT SILK 3-0 18XBRD TIE 12 (SUTURE) IMPLANT
SUT VIC AB 3-0 SH 27 (SUTURE)
SUT VIC AB 3-0 SH 27XBRD (SUTURE) IMPLANT
SUT VIC AB 4-0 PS2 27 (SUTURE) IMPLANT
SYR BULB IRRIGATION 50ML (SYRINGE) ×2 IMPLANT
TAPE CLOTH SOFT 2X10 (GAUZE/BANDAGES/DRESSINGS) ×2 IMPLANT
TOWEL OR 17X26 10 PK STRL BLUE (TOWEL DISPOSABLE) ×2 IMPLANT
WATER STERILE IRR 1500ML POUR (IV SOLUTION) IMPLANT
YANKAUER SUCT BULB TIP 10FT TU (MISCELLANEOUS) IMPLANT

## 2013-06-20 NOTE — Brief Op Note (Signed)
06/20/2013  10:27 AM  PATIENT:  Benjamin Hahn  79 y.o. male  PRE-OPERATIVE DIAGNOSIS:  rule out temperoral arteritis   POST-OPERATIVE DIAGNOSIS:  rule out temperoral arteritis   PROCEDURE:  Procedure(s): BIOPSY TEMPORAL ARTERY RIGHT (Right)  SURGEON:  Surgeon(s) and Role:    * Velora Heckler, MD - Primary  ANESTHESIA:   IV sedation  EBL:  Total I/O In: 500 [I.V.:500] Out: -   BLOOD ADMINISTERED:none  DRAINS: none   LOCAL MEDICATIONS USED:  MARCAINE     SPECIMEN:  Excision  DISPOSITION OF SPECIMEN:  PATHOLOGY  COUNTS:  YES  TOURNIQUET:  * No tourniquets in log *  DICTATION: .Other Dictation: Dictation Number [id number]  PLAN OF CARE: Discharge to home after PACU  PATIENT DISPOSITION:  PACU - hemodynamically stable.   Delay start of Pharmacological VTE agent (>24hrs) due to surgical blood loss or risk of bleeding: yes  Velora Heckler, MD, [facility] Office: [phone]

## 2013-06-20 NOTE — Interval H&P Note (Signed)
History and Physical Interval Note:  06/20/2013 9:07 AM  Benjamin Hahn  has presented today for surgery, with the diagnosis of rule out temperoral arteritis.   The various methods of treatment have been discussed with the patient and family. After consideration of risks, benefits and other options for treatment, the patient has consented to    Procedure(s): BIOPSY TEMPORAL ARTERY (Right) as a surgical intervention .    The patient's history has been reviewed, patient examined, no change in status, stable for surgery.  I have reviewed the patient's chart and labs.  Questions were answered to the patient's satisfaction.    Velora Heckler, MD, [facility] Office: [phone]    Moria Brophy Judie Petit

## 2013-06-20 NOTE — Progress Notes (Signed)
Phase 2 Short Stay post op. Pt was able to ambulate to BR with minimal assist and voided. Gait was steady. C/o mild headache in forehead that went from 4  To 7 then back to 4/10. Pt states he is eager to go home and take his pain medication at home when he has something on his stomach.

## 2013-06-20 NOTE — Anesthesia Preprocedure Evaluation (Addendum)
Anesthesia Evaluation  Patient identified by MRN, date of birth, ID band Patient awake    Reviewed: Allergy & Precautions, H&P , NPO status , Patient's Chart, lab work & pertinent test results  Airway Mallampati: II TM Distance: >3 FB Neck ROM: Full    Dental no notable dental hx. (+) Edentulous Upper, Missing and Poor Dentition   Pulmonary neg pulmonary ROS, former smoker,  breath sounds clear to auscultation  Pulmonary exam normal       Cardiovascular negative cardio ROS  Rhythm:Regular Rate:Normal     Neuro/Psych PSYCHIATRIC DISORDERS Anxiety Depression negative neurological ROS  negative psych ROS   GI/Hepatic negative GI ROS, Neg liver ROS,   Endo/Other  negative endocrine ROSHypothyroidism   Renal/GU negative Renal ROS  negative genitourinary   Musculoskeletal negative musculoskeletal ROS (+)   Abdominal   Peds negative pediatric ROS (+)  Hematology negative hematology ROS (+)   Anesthesia Other Findings   Reproductive/Obstetrics negative OB ROS                          Anesthesia Physical Anesthesia Plan  ASA: II  Anesthesia Plan: MAC   Post-op Pain Management:    Induction:   Airway Management Planned: Simple Face Mask and Nasal Cannula  Additional Equipment:   Intra-op Plan:   Post-operative Plan:   Informed Consent: I have reviewed the patients History and Physical, chart, labs and discussed the procedure including the risks, benefits and alternatives for the proposed anesthesia with the patient or authorized representative who has indicated his/her understanding and acceptance.   Dental advisory given  Plan Discussed with: CRNA  Anesthesia Plan Comments:         Anesthesia Quick Evaluation

## 2013-06-20 NOTE — Transfer of Care (Signed)
Immediate Anesthesia Transfer of Care Note  Patient: Benjamin Hahn  Procedure(s) Performed: Procedure(s): BIOPSY TEMPORAL ARTERY RIGHT (Right)  Patient Location: PACU  Anesthesia Type:MAC  Level of Consciousness: awake, alert  and oriented  Airway & Oxygen Therapy: Patient Spontanous Breathing and Patient connected to nasal cannula oxygen  Post-op Assessment: Report given to PACU RN and Post -op Vital signs reviewed and stable  Post vital signs: Reviewed and stable  Complications: No apparent anesthesia complications

## 2013-06-20 NOTE — Anesthesia Postprocedure Evaluation (Signed)
Anesthesia Post Note  Patient: Benjamin Hahn  Procedure(s) Performed: Procedure(s) (LRB): BIOPSY TEMPORAL ARTERY RIGHT (Right)  Anesthesia type: MAC  Patient location: PACU  Post pain: Pain level controlled  Post assessment: Post-op Vital signs reviewed  Last Vitals:  Filed Vitals:   06/20/13 1150  BP: 134/73  Pulse: 84  Temp: 36.4 C  Resp: 16    Post vital signs: Reviewed  Level of consciousness: sedated  Complications: No apparent anesthesia complications

## 2013-06-20 NOTE — H&P (View-Only) (Signed)
General Surgery - [facility]  Chief Complaint  Patient presents with  . New Evaluation    eval for temporal artery biopsy - referral from Dr. Ajay Kumar    HISTORY: Patient is a 79-year-old male referred by his primary physician for evaluation of possible temporal arteritis. Patient has had a recent episode of vision loss and nonspecific headaches. He has undergone an extensive evaluation in recent days by his primary physician and his neurologist. This includes MRI, carotid duplex, and laboratory studies. Patient is now referred to general surgery for temporal artery biopsy to rule out temporal arteritis.  Past Medical History  Diagnosis Date  . Hypothyroid   . Depression 1990s  . Shingles May 2014    Right face    Current Outpatient Prescriptions  Medication Sig Dispense Refill  . ALPRAZolam (NIRAVAM) 0.5 MG dissolvable tablet Take 1 tablet (0.5 mg total) by mouth 2 (two) times daily as needed for anxiety.  30 tablet  1  . aspirin EC 325 MG tablet Take 1 tablet (325 mg total) by mouth daily.  30 tablet  0  . atorvastatin (LIPITOR) 10 MG tablet Take half tablet daily  30 tablet  5  . buPROPion (WELLBUTRIN) 75 MG tablet Take 75 mg by mouth daily.       . cyclobenzaprine (FLEXERIL) 10 MG tablet Take one tablet once daily  30 tablet  1  . fluticasone (CUTIVATE) 0.05 % cream       . imipramine (TOFRANIL) 10 MG tablet       . levothyroxine (SYNTHROID, LEVOTHROID) 112 MCG tablet Take 1 tablet (112 mcg total) by mouth daily.  30 tablet  11  . oxyCODONE-acetaminophen (PERCOCET/ROXICET) 5-325 MG per tablet Take 1 tablet by mouth every 6 (six) hours as needed. For pain      . predniSONE (DELTASONE) 20 MG tablet Take 3 tablets (60 mg total) by mouth daily.  90 tablet  3  . ramelteon (ROZEREM) 8 MG tablet Take 1 tablet (8 mg total) by mouth at bedtime.  30 tablet  0  . tamsulosin (FLOMAX) 0.4 MG CAPS capsule Take 1 capsule (0.4 mg total) by mouth daily.  30 capsule  3  .  testosterone cypionate (DEPOTESTOTERONE CYPIONATE) 200 MG/ML injection Inject 200 mg into the muscle every 14 (fourteen) days.      . venlafaxine (EFFEXOR) 37.5 MG tablet Take 1 tablet (37.5 mg total) by mouth 2 (two) times daily.  30 tablet  2   No current facility-administered medications for this visit.    No Known Allergies  Family History  Problem Relation Age of Onset  . Stroke Father     Died, 80s  . Stroke Sister     Living, 75  . Heart disease Mother     Died, 92  . Healthy Daughter     History   Social History  . Marital Status: Married    Spouse Name: N/A    Number of Children: N/A  . Years of Education: N/A   Social History Main Topics  . Smoking status: Former Smoker    Quit date: 08/24/1966  . Smokeless tobacco: None  . Alcohol Use: 0.6 oz/week    1 Shots of liquor per week     Comment: 1-2 shots of vodka day, 20 years  . Drug Use: No  . Sexual Activity: None   Other Topics Concern  . None   Social History Narrative   Currently works as a civil lawyer.  Lives   with wife and they have two healthy daughters.    REVIEW OF SYSTEMS - PERTINENT POSITIVES ONLY: Nonspecific headaches, recent acute vision loss, history of shingles right scalp and forehead  EXAM: Filed Vitals:   06/16/13 1320  BP: 130/82  Pulse: 96  Temp: 99.3 F (37.4 C)  Resp: 15    HEENT: normocephalic; pupils equal and reactive; sclerae clear; dentition good; mucous membranes moist; palpable bilateral temporal pulses; no tenderness over the temporal arteries bilaterally NECK:  symmetric on extension; no palpable anterior or posterior cervical lymphadenopathy; no supraclavicular masses; no tenderness CHEST: clear to auscultation bilaterally without rales, rhonchi, or wheezes CARDIAC: regular rate and rhythm without significant murmur; peripheral pulses are full EXT:  non-tender without edema; no deformity NEURO: no gross focal deficits; no sign of tremor   LABORATORY  RESULTS: See [facility] (CHL-Epic) for most recent results  RADIOLOGY RESULTS: See [facility] (CHL-Epic) for most recent results  IMPRESSION: Rule out temporal arteritis  PLAN: Patient is referred by his primary care physician for right temporal artery biopsy to rule out temporal arteritis. Patient has had headaches and recent acute vision loss. He has been started on high-dose oral steroids.  I discussed temporal artery biopsy with the patient and his wife. I explained location of the surgical incision. We will perform this as an outpatient surgical procedure as soon as possible. They understand and wish to proceed.  The risks and benefits of the procedure have been discussed at length with the patient.  The patient understands the proposed procedure, potential alternative treatments, and the course of recovery to be expected.  All of the patient's questions have been answered at this time.  The patient wishes to proceed with surgery.  Jeffry Vogelsang M. Ethyn Schetter, MD, FACS General & Endocrine Surgery [facility]  Primary Care Physician: KUMAR,AJAY, MD   

## 2013-06-21 ENCOUNTER — Encounter (HOSPITAL_COMMUNITY): Payer: Self-pay | Admitting: Surgery

## 2013-06-21 ENCOUNTER — Telehealth (INDEPENDENT_AMBULATORY_CARE_PROVIDER_SITE_OTHER): Payer: Self-pay

## 2013-06-21 NOTE — Telephone Encounter (Signed)
Pt home doing well. PO appt made with pts wife. She is advised we will follow wd and healing but pt needs to keep f/u appt with Dr Lucianne Muss to discuss pathology and treatment plans. She states she understands and does have upcoming appt with Dr Lucianne Muss.

## 2013-06-21 NOTE — Op Note (Signed)
NAME:  CODEN, FRANCHI NO.:  [id number]  MEDICAL RECORD NO.:  [id number]  LOCATION:  [facility]                         FACILITY:  [facility]  PHYSICIAN:  Velora Heckler, MD      DATE OF BIRTH:  06/07/1933  DATE OF PROCEDURE:  06/20/2013                               OPERATIVE REPORT   PREOPERATIVE DIAGNOSIS:  Rule out temporal arteritis.  POSTOPERATIVE DIAGNOSIS:  Rule out temporal arteritis.  PROCEDURE:  Right temporal artery excisional biopsy.  SURGEON:  Velora Heckler, MD, FACS  ANESTHESIA:  Local with intravenous sedation.  PREPARATION:  ChloraPrep.  COMPLICATIONS:  None.  INDICATIONS:  The patient is a 79 year old male undergoing workup for temporal arteritis.  He has experienced headaches and acute vision loss. He now comes to Surgery for his procedure.  BODY OF REPORT:  Procedure was done in OR #11 at the [facility].  The patient was brought to the operating room, placed in supine position on the operating room table.  Following administration of intravenous sedation, the patient was placed in a mild left lateral decubitus position and the right preauricular region was prepped and draped in the usual aseptic fashion.  After ascertaining that an adequate level of sedation had been achieved, the skin in front of the tragus is anesthetized with local anesthetic.  Artery was palpable.  The 2.5 cm incision was made with a #15 blade.  Dissection was carried through the skin into the subcutaneous tissues.  Cutaneous nerves were identified and preserved.  Temporal artery was identified. This was confirmed with use of a Doppler.  The artery was dissected out for at least 1 cm along its length.  A preauricular branch is also dissected out.  The artery and its branches were ligated with 2-0 silk ties.  Using a #15 blade, the artery was excised.  It was placed in formalin and submitted to Pathology for review.  Good hemostasis was noted throughout  the wound.  Again examining with the Doppler shows no residual arterial pulse in the immediate operative field.  The skin was closed with a running 4-0 Monocryl subcuticular suture.  Wound was washed and dried and benzoin and Steri-Strips were applied.  The patient was awakened from anesthesia and brought to the recovery room.  The patient tolerated the procedure well.   Velora Heckler, MD, [facility] Office: [phone]    TMG/MEDQ  D:  06/20/2013  T:  06/21/2013  Job:  [id number]  cc:   Reather Littler, M.D. Fax: [phone]  Dr. Lester Kinsman [facility]

## 2013-06-22 NOTE — Telephone Encounter (Signed)
Left message to return call 

## 2013-06-23 ENCOUNTER — Telehealth: Payer: Self-pay | Admitting: Endocrinology

## 2013-06-23 NOTE — Telephone Encounter (Signed)
Results given to patient, he understood.

## 2013-06-23 NOTE — Telephone Encounter (Signed)
Biopsy showed Temporal arteritis as expected

## 2013-06-26 ENCOUNTER — Other Ambulatory Visit: Payer: Self-pay | Admitting: *Deleted

## 2013-06-26 MED ORDER — ALPRAZOLAM 0.5 MG PO TABS: 0.5000 mg | ORAL_TABLET | Freq: Two times a day (BID) | ORAL | Status: DC | PRN

## 2013-06-26 NOTE — Telephone Encounter (Signed)
I spoke with patient's wife who states that patient has recently been diagnosed with temporal arteritis and is being treated with Prednisone which is working.  Wife states they are not scheduling TEE at this time and thanked me for the follow-up.

## 2013-06-27 ENCOUNTER — Other Ambulatory Visit: Payer: Self-pay | Admitting: Dermatology

## 2013-06-27 ENCOUNTER — Encounter (HOSPITAL_COMMUNITY): Payer: Medicare Other

## 2013-06-27 DIAGNOSIS — C4442 Squamous cell carcinoma of skin of scalp and neck: Secondary | ICD-10-CM | POA: Diagnosis not present

## 2013-06-27 DIAGNOSIS — C44621 Squamous cell carcinoma of skin of unspecified upper limb, including shoulder: Secondary | ICD-10-CM | POA: Diagnosis not present

## 2013-06-28 ENCOUNTER — Ambulatory Visit (INDEPENDENT_AMBULATORY_CARE_PROVIDER_SITE_OTHER): Payer: Medicare Other | Admitting: Endocrinology

## 2013-06-28 ENCOUNTER — Other Ambulatory Visit: Payer: Medicare Other

## 2013-06-28 ENCOUNTER — Encounter: Payer: Self-pay | Admitting: Endocrinology

## 2013-06-28 VITALS — BP 130/68 | HR 100 | Temp 98.6°F | Resp 12 | Ht 68.0 in | Wt 195.1 lb

## 2013-06-28 DIAGNOSIS — R339 Retention of urine, unspecified: Secondary | ICD-10-CM | POA: Diagnosis not present

## 2013-06-28 DIAGNOSIS — E291 Testicular hypofunction: Secondary | ICD-10-CM | POA: Diagnosis not present

## 2013-06-28 DIAGNOSIS — M316 Other giant cell arteritis: Secondary | ICD-10-CM | POA: Diagnosis not present

## 2013-06-28 DIAGNOSIS — E785 Hyperlipidemia, unspecified: Secondary | ICD-10-CM | POA: Diagnosis not present

## 2013-06-28 DIAGNOSIS — I10 Essential (primary) hypertension: Secondary | ICD-10-CM

## 2013-06-28 LAB — COMPREHENSIVE METABOLIC PANEL
AST: 15 U/L (ref 0–37)
Alkaline Phosphatase: 54 U/L (ref 39–117)
BUN: 28 mg/dL — ABNORMAL HIGH (ref 6–23)
Creatinine, Ser: 1.4 mg/dL (ref 0.4–1.5)
Potassium: 4.2 mEq/L (ref 3.5–5.1)

## 2013-06-28 LAB — LIPID PANEL
Cholesterol: 152 mg/dL (ref 0–200)
HDL: 71.3 mg/dL (ref >39.00)
LDL Cholesterol: 69 mg/dL (ref 0–99)
Total CHOL/HDL Ratio: 2
Triglycerides: 61 mg/dL (ref 0.0–149.0)
VLDL: 12.2 mg/dL (ref 0.0–40.0)

## 2013-06-28 LAB — SEDIMENTATION RATE: Sed Rate: 26 mm/hr — ABNORMAL HIGH (ref 0–22)

## 2013-06-28 NOTE — Patient Instructions (Addendum)
Doses to be decided after labs

## 2013-06-28 NOTE — Progress Notes (Signed)
  Subjective:     Patient ID: Benjamin Hahn, male   DOB: [birth year], 79 y.o.   MRN: [id number]  HPI  On his last visit he was having episodes of visual loss, starting in the left eye first and then subsequently bilateral In the week he was seen he was having episodes of visual loss daily lasting about 10 minutes He was also having a mild headache for about 10 days He has been seen by his ophthalmologist last month and his exam had been normal He did not have any trouble chewing No diplopia or difficulty with gait recently which she had about a month ago  Currently  taking  Aspirin  Because of his above symptoms and high ESR of 55 he was started on 60 mg prednisone Also had a temporal artery biopsy which was positive Since he started the prednisone he has not had any more episodes of amaurosis. Also he has had no headaches Currently tolerating prednisone very well except for some increase in appetite and a 2 pound weight gain. However has not been exercising because of above symptoms. No insomnia with prednisone  Review of Systems He has a  history of mild hypertension But because of low normal blood pressure readings his medications have been stopped  He was started on Flomax for his prostatism and he is having good control of symptoms including nocturia and weak stream  He had had herpes zoster on the right trigeminal early this year and has no further neurologic pains     Objective:   Physical Exam BP 130/68  Pulse 100  Temp(Src) 98.6 F (37 C)  Resp 12  Ht 5\' 8"  (1.727 m)  Wt 195 lb 1.6 oz (88.497 kg)  BMI 29.67 kg/m2  SpO2 94%   has healed scar of temporal artery biopsy on the right  Assessment:     Temporal arteritis/giant cell arteritis, biopsy-proven Excellent response to starting high-dose prednisone 2 weeks ago      Plan:     Check ESR and consider reducing the dose to 50 mg for another 2 weeks at least     ADDENDUM: ESR 26, he was to reduce the dose to  50 mg

## 2013-06-29 NOTE — Progress Notes (Signed)
Quick Note:  Please let patient know that the ESR test is better, reduce to 2 1/2 of 20 mg prednisone ______

## 2013-07-07 ENCOUNTER — Telehealth: Payer: Self-pay | Admitting: Neurology

## 2013-07-07 NOTE — Telephone Encounter (Signed)
Pt no showed Holter monitor appt / Sherri

## 2013-07-07 NOTE — Telephone Encounter (Signed)
Noted  

## 2013-07-12 ENCOUNTER — Encounter (INDEPENDENT_AMBULATORY_CARE_PROVIDER_SITE_OTHER): Payer: Self-pay | Admitting: Surgery

## 2013-07-12 ENCOUNTER — Ambulatory Visit (INDEPENDENT_AMBULATORY_CARE_PROVIDER_SITE_OTHER): Payer: Medicare Other | Admitting: Surgery

## 2013-07-12 ENCOUNTER — Encounter (INDEPENDENT_AMBULATORY_CARE_PROVIDER_SITE_OTHER): Payer: Self-pay

## 2013-07-12 VITALS — BP 124/76 | HR 88 | Temp 98.4°F | Resp 15 | Ht 68.5 in | Wt 199.8 lb

## 2013-07-12 DIAGNOSIS — M316 Other giant cell arteritis: Secondary | ICD-10-CM

## 2013-07-12 DIAGNOSIS — E291 Testicular hypofunction: Secondary | ICD-10-CM | POA: Diagnosis not present

## 2013-07-12 NOTE — Patient Instructions (Signed)
  COCOA BUTTER & VITAMIN E CREAM  ([employer] or other brand)  Apply cocoa butter/vitamin E cream to your incision 2 - 3 times daily.  Massage cream into incision for one minute with each application.  Use sunscreen (50 SPF or higher) for first 6 months after surgery if area is exposed to sun.  You may substitute Mederma or other scar reducing creams as desired.   

## 2013-07-12 NOTE — Progress Notes (Signed)
General Surgery [facility]  Chief Complaint  Patient presents with  . Routine Post Op    temporal artery bx 06/20/2013    HISTORY: Patient is a 79 year old male who underwent a temporal artery biopsy on 06/20/2013. Final pathology was positive for temporal arteritis. He is now taking prednisone 50 mg daily under the direction of his endocrinologist and neurologist.  EXAM: Surgical incision is healed nicely. No sign of infection.  IMPRESSION: Status post right temporal artery biopsy  PLAN: Patient will begin applying topical creams to his incision.  Patient will return for surgical care as needed.  Velora Heckler, MD, FACS General & Endocrine Surgery [facility]   Visit Diagnoses: 1. Temporal arteritis

## 2013-07-24 ENCOUNTER — Other Ambulatory Visit (INDEPENDENT_AMBULATORY_CARE_PROVIDER_SITE_OTHER): Payer: Medicare Other

## 2013-07-24 DIAGNOSIS — M316 Other giant cell arteritis: Secondary | ICD-10-CM

## 2013-07-24 DIAGNOSIS — I10 Essential (primary) hypertension: Secondary | ICD-10-CM

## 2013-07-24 LAB — BASIC METABOLIC PANEL WITH GFR
BUN: 23 mg/dL (ref 6–23)
CO2: 24 meq/L (ref 19–32)
Calcium: 8.6 mg/dL (ref 8.4–10.5)
Chloride: 103 meq/L (ref 96–112)
Creatinine, Ser: 1.2 mg/dL (ref 0.4–1.5)
GFR: 60.98 mL/min
Glucose, Bld: 97 mg/dL (ref 70–99)
Potassium: 4.1 meq/L (ref 3.5–5.1)
Sodium: 138 meq/L (ref 135–145)

## 2013-07-24 LAB — SEDIMENTATION RATE: Sed Rate: 54 mm/h — ABNORMAL HIGH (ref 0–22)

## 2013-07-27 ENCOUNTER — Encounter: Payer: Self-pay | Admitting: Endocrinology

## 2013-07-27 ENCOUNTER — Ambulatory Visit (INDEPENDENT_AMBULATORY_CARE_PROVIDER_SITE_OTHER): Payer: Medicare Other | Admitting: Endocrinology

## 2013-07-27 VITALS — BP 110/62 | HR 89 | Temp 98.3°F | Resp 12 | Ht 68.0 in | Wt 202.2 lb

## 2013-07-27 DIAGNOSIS — F411 Generalized anxiety disorder: Secondary | ICD-10-CM | POA: Diagnosis not present

## 2013-07-27 DIAGNOSIS — M316 Other giant cell arteritis: Secondary | ICD-10-CM | POA: Diagnosis not present

## 2013-07-27 DIAGNOSIS — R638 Other symptoms and signs concerning food and fluid intake: Secondary | ICD-10-CM | POA: Diagnosis not present

## 2013-07-27 DIAGNOSIS — R635 Abnormal weight gain: Secondary | ICD-10-CM

## 2013-07-27 DIAGNOSIS — J069 Acute upper respiratory infection, unspecified: Secondary | ICD-10-CM | POA: Diagnosis not present

## 2013-07-27 MED ORDER — ALPRAZOLAM 0.5 MG PO TABS: 0.5000 mg | ORAL_TABLET | Freq: Three times a day (TID) | ORAL | Status: DC | PRN

## 2013-07-27 MED ORDER — HYDROCOD POLST-CHLORPHEN POLST 10-8 MG/5ML PO LQCR: 5.0000 mL | Freq: Two times a day (BID) | ORAL | Status: DC | PRN

## 2013-07-27 MED ORDER — PHENTERMINE-TOPIRAMATE ER 3.75-23 MG PO CP24: 3.7500 mg | ORAL_CAPSULE | ORAL | Status: DC

## 2013-07-27 NOTE — Progress Notes (Signed)
Subjective:     Patient ID: Benjamin Hahn, male   DOB: 12/04/1932, 79 y.o.   MRN: [id number]  Chief complaint: Followup of temporal arteritis  HPI  Background history: In early October he washaving episodes of visual loss, starting in the left eye first and then subsequently bilateral He was also having a mild headache for about 10 days Because of his above symptoms and high ESR of 55 he was started on 60 mg prednisone Also had a temporal artery biopsy which was positive for temporal arteritis  RECENT HISTORY: Since he has started the prednisone he has not had any more episodes of amaurosis or headaches. With his ESR improved on the last visit to 26 the dose was reduced to 50 mg Currently tolerating prednisone  well except for  increase in appetite and further weight gain. However has not been exercising because of above symptoms. He wants to use a medication to reduce his appetite He is ESR now is 55  Problem 2: Since earlier this week he has had a cough which is mostly dry and irritating but no significant sore throat or fever. No wheezing or sinus headaches. Has taken some OTC medication without enough relief  Problem 3. He is asking about increased anxiety including at work and wants to take Xanax as needed during the day, currently taking mostly at night     Medication List       This list is accurate as of: 07/27/13  4:36 PM.  Always use your most recent med list.               ALPRAZolam 0.5 MG tablet  Commonly known as:  XANAX  Take 1 tablet (0.5 mg total) by mouth 2 (two) times daily as needed for anxiety.     aspirin 325 MG tablet  Take 325 mg by mouth daily. Stopped for procedure     atorvastatin 20 MG tablet  Commonly known as:  LIPITOR  Take 10 mg by mouth every morning.     buPROPion 150 MG 12 hr tablet  Commonly known as:  WELLBUTRIN SR  Take 1 tablet (150 mg total) by mouth every morning.     cyclobenzaprine 10 MG tablet  Commonly known as:   FLEXERIL  Take 10 mg by mouth daily.     gabapentin 300 MG capsule  Commonly known as:  NEURONTIN     HYDROcodone-acetaminophen 5-325 MG per tablet  Commonly known as:  NORCO/VICODIN  Take 1-2 tablets by mouth every 4 (four) hours as needed for pain.     imipramine 10 MG tablet  Commonly known as:  TOFRANIL  Take 20 mg by mouth at bedtime.     levothyroxine 112 MCG tablet  Commonly known as:  SYNTHROID, LEVOTHROID  Take 112 mcg by mouth daily before breakfast.     predniSONE 20 MG tablet  Commonly known as:  DELTASONE  Take 50 mg by mouth daily.     ROZEREM 8 MG tablet  Generic drug:  ramelteon     tamsulosin 0.4 MG Caps capsule  Commonly known as:  FLOMAX  Take 0.4 mg by mouth daily.     testosterone cypionate 200 MG/ML injection  Commonly known as:  DEPOTESTOTERONE CYPIONATE  Inject 200 mg into the muscle every 14 (fourteen) days.     venlafaxine XR 37.5 MG 24 hr capsule  Commonly known as:  EFFEXOR-XR  Take 37.5 mg by mouth every morning.        Review  of Systems  Genitourinary:       He is getting testosterone injections from the urologist every 2 weeks. However still complaining about erectile dysfunction. Blood levels are being monitored by urologist, recent records not available   He has a  history of mild hypertension But because of low normal blood pressure readings his medications have been stopped  He was started on Flomax for his prostatism and he is having good control of symptoms including nocturia and weak stream  He had had herpes zoster on the right trigeminal early this year and has no further neurologic pains LABS: Appointment on 07/24/2013  Component Date Value Range Status  . Sed Rate 07/24/2013 54* 0 - 22 mm/hr Final  . Sodium 07/24/2013 138  135 - 145 mEq/L Final  . Potassium 07/24/2013 4.1  3.5 - 5.1 mEq/L Final  . Chloride 07/24/2013 103  96 - 112 mEq/L Final  . CO2 07/24/2013 24  19 - 32 mEq/L Final  . Glucose, Bld 07/24/2013 97  70 -  99 mg/dL Final  . BUN 40/98/1191 23  6 - 23 mg/dL Final  . Creatinine, Ser 07/24/2013 1.2  0.4 - 1.5 mg/dL Final  . Calcium 47/82/9562 8.6  8.4 - 10.5 mg/dL Final  . GFR 13/03/6577 60.98  >60.00 mL/min Final  Office Visit on 06/28/2013  Component Date Value Range Status  . Cholesterol 06/28/2013 152  0 - 200 mg/dL Final   ATP III Classification       Desirable:  < 200 mg/dL               Borderline High:  200 - 239 mg/dL          High:  > = 469 mg/dL  . Triglycerides 06/28/2013 61.0  0.0 - 149.0 mg/dL Final   Normal:  <629 mg/dLBorderline High:  150 - 199 mg/dL  . HDL 06/28/2013 71.30  >39.00 mg/dL Final  . VLDL 52/84/1324 12.2  0.0 - 40.0 mg/dL Final  . LDL Cholesterol 06/28/2013 69  0 - 99 mg/dL Final  . Total CHOL/HDL Ratio 06/28/2013 2   Final                  Men          Women1/2 Average Risk     3.4          3.3Average Risk          5.0          4.42X Average Risk          9.6          7.13X Average Risk          15.0          11.0                      . Sodium 06/28/2013 138  135 - 145 mEq/L Final  . Potassium 06/28/2013 4.2  3.5 - 5.1 mEq/L Final  . Chloride 06/28/2013 101  96 - 112 mEq/L Final  . CO2 06/28/2013 27  19 - 32 mEq/L Final  . Glucose, Bld 06/28/2013 131* 70 - 99 mg/dL Final  . BUN 40/05/2724 28* 6 - 23 mg/dL Final  . Creatinine, Ser 06/28/2013 1.4  0.4 - 1.5 mg/dL Final  . Total Bilirubin 06/28/2013 0.3  0.3 - 1.2 mg/dL Final  . Alkaline Phosphatase 06/28/2013 54  39 - 117 U/L Final  . AST 06/28/2013 15  0 -  37 U/L Final  . ALT 06/28/2013 26  0 - 53 U/L Final  . Total Protein 06/28/2013 6.8  6.0 - 8.3 g/dL Final  . Albumin 40/98/1191 3.4* 3.5 - 5.2 g/dL Final  . Calcium 47/82/9562 8.9  8.4 - 10.5 mg/dL Final  . GFR 13/03/6577 51.61* >60.00 mL/min Final  . Sed Rate 06/28/2013 26* 0 - 22 mm/hr Final  Hospital Outpatient Visit on 06/19/2013  Component Date Value Range Status  . WBC 06/19/2013 13.9* 4.0 - 10.5 K/uL Final  . RBC 06/19/2013 4.46  4.22 - 5.81 MIL/uL  Final  . Hemoglobin 06/19/2013 14.1  13.0 - 17.0 g/dL Final  . HCT 46/96/2952 42.0  39.0 - 52.0 % Final  . MCV 06/19/2013 94.2  78.0 - 100.0 fL Final  . MCH 06/19/2013 31.6  26.0 - 34.0 pg Final  . MCHC 06/19/2013 33.6  30.0 - 36.0 g/dL Final  . RDW 84/13/2440 13.1  11.5 - 15.5 % Final  . Platelets 06/19/2013 418* 150 - 400 K/uL Final       Objective:   Physical Exam BP 110/62  Pulse 89  Temp(Src) 98.3 F (36.8 C)  Resp 12  Ht 5\' 8"  (1.727 m)  Wt 202 lb 3.2 oz (91.717 kg)  BMI 30.75 kg/m2  SpO2 94%  No lymphadenopathy in the neck Lungs are clear No ankle edema  Assessment:     Temporal arteritis/giant cell arteritis, biopsy-proven Good response to starting high-dose prednisone  His high ESR today is likely to be from viral infection he started this week However will need to repeat ESR after he is over the infection before changing the dose of prednisone Would like to reduce his dose because of his weight gain and relatively high dose of 50 mg that he is taking  Upper respiratory infection: Likely to be viral and has significant cough     Plan:     Check ESR again next week and consider reducing the dose to 40 mg if back to normal  He was given a prescription for Qsymia 14 day low dose and he will try to use the free trial component on the Internet for this If he wants to continue that she will be given a new prescription Discussed actions of this drug and possible side effects  He can start a gradual exercise program with treadmill walking and increase as tolerated to help with weight loss  Prescription given for Xanax 0.5 mg up to 3 times a day  Symptomatic treatment of his cough with Tussionex syrup  He can stop the gabapentin as he does not have any neuralgic pain and is complaining of some dry mouth  Keneth Borg

## 2013-07-27 NOTE — Patient Instructions (Signed)
Stop Gabapentin. 

## 2013-07-31 ENCOUNTER — Other Ambulatory Visit: Payer: Self-pay | Admitting: *Deleted

## 2013-07-31 MED ORDER — CYCLOBENZAPRINE HCL 10 MG PO TABS: 10.0000 mg | ORAL_TABLET | Freq: Every day | ORAL | Status: DC

## 2013-08-02 DIAGNOSIS — E291 Testicular hypofunction: Secondary | ICD-10-CM | POA: Diagnosis not present

## 2013-08-02 DIAGNOSIS — R972 Elevated prostate specific antigen [PSA]: Secondary | ICD-10-CM | POA: Diagnosis not present

## 2013-08-03 ENCOUNTER — Other Ambulatory Visit (INDEPENDENT_AMBULATORY_CARE_PROVIDER_SITE_OTHER): Payer: Medicare Other

## 2013-08-03 ENCOUNTER — Other Ambulatory Visit: Payer: Medicare Other

## 2013-08-03 DIAGNOSIS — M316 Other giant cell arteritis: Secondary | ICD-10-CM

## 2013-08-08 ENCOUNTER — Other Ambulatory Visit: Payer: Self-pay | Admitting: Endocrinology

## 2013-08-08 DIAGNOSIS — L259 Unspecified contact dermatitis, unspecified cause: Secondary | ICD-10-CM | POA: Diagnosis not present

## 2013-08-08 DIAGNOSIS — L723 Sebaceous cyst: Secondary | ICD-10-CM | POA: Diagnosis not present

## 2013-08-08 DIAGNOSIS — Z85828 Personal history of other malignant neoplasm of skin: Secondary | ICD-10-CM | POA: Diagnosis not present

## 2013-08-09 DIAGNOSIS — N139 Obstructive and reflux uropathy, unspecified: Secondary | ICD-10-CM | POA: Diagnosis not present

## 2013-08-09 DIAGNOSIS — R82998 Other abnormal findings in urine: Secondary | ICD-10-CM | POA: Diagnosis not present

## 2013-08-09 DIAGNOSIS — R972 Elevated prostate specific antigen [PSA]: Secondary | ICD-10-CM | POA: Diagnosis not present

## 2013-08-09 DIAGNOSIS — N401 Enlarged prostate with lower urinary tract symptoms: Secondary | ICD-10-CM | POA: Diagnosis not present

## 2013-08-09 DIAGNOSIS — R339 Retention of urine, unspecified: Secondary | ICD-10-CM | POA: Diagnosis not present

## 2013-08-11 ENCOUNTER — Ambulatory Visit (INDEPENDENT_AMBULATORY_CARE_PROVIDER_SITE_OTHER): Payer: Medicare Other | Admitting: *Deleted

## 2013-08-11 DIAGNOSIS — Z23 Encounter for immunization: Secondary | ICD-10-CM | POA: Diagnosis not present

## 2013-08-21 ENCOUNTER — Other Ambulatory Visit: Payer: Self-pay | Admitting: Endocrinology

## 2013-08-28 ENCOUNTER — Ambulatory Visit (INDEPENDENT_AMBULATORY_CARE_PROVIDER_SITE_OTHER): Payer: Medicare Other | Admitting: Endocrinology

## 2013-08-28 ENCOUNTER — Other Ambulatory Visit (INDEPENDENT_AMBULATORY_CARE_PROVIDER_SITE_OTHER): Payer: Medicare Other

## 2013-08-28 ENCOUNTER — Encounter: Payer: Self-pay | Admitting: Endocrinology

## 2013-08-28 VITALS — BP 126/82 | HR 103 | Temp 98.3°F | Resp 12 | Ht 68.0 in | Wt 200.1 lb

## 2013-08-28 DIAGNOSIS — M316 Other giant cell arteritis: Secondary | ICD-10-CM

## 2013-08-28 LAB — COMPREHENSIVE METABOLIC PANEL
ALT: 24 U/L (ref 0–53)
AST: 17 U/L (ref 0–37)
Albumin: 4 g/dL (ref 3.5–5.2)
Alkaline Phosphatase: 34 U/L — ABNORMAL LOW (ref 39–117)
BILIRUBIN TOTAL: 0.5 mg/dL (ref 0.3–1.2)
BUN: 26 mg/dL — ABNORMAL HIGH (ref 6–23)
CO2: 31 meq/L (ref 19–32)
Calcium: 9.4 mg/dL (ref 8.4–10.5)
Chloride: 101 mEq/L (ref 96–112)
Creatinine, Ser: 1.3 mg/dL (ref 0.4–1.5)
GFR: 57.68 mL/min — AB (ref >60.00)
Glucose, Bld: 109 mg/dL — ABNORMAL HIGH (ref 70–99)
Potassium: 4.3 mEq/L (ref 3.5–5.1)
Sodium: 139 mEq/L (ref 135–145)
TOTAL PROTEIN: 7 g/dL (ref 6.0–8.3)

## 2013-08-28 MED ORDER — PREDNISONE 10 MG PO TABS: 40.0000 mg | ORAL_TABLET | Freq: Every day | ORAL | Status: DC

## 2013-08-28 NOTE — Progress Notes (Signed)
Subjective:     Patient ID: Benjamin Hahn, male   DOB: Nov 17, 1932, 80 y.o.   MRN: [id number]  Chief complaint: Followup of temporal arteritis  HPI  Background history: In early October he was having episodes of visual loss, starting in the left eye first and then subsequently bilateral He was also having a mild headache for about 10 days Because of his above symptoms and high ESR of 55 he was started on 60 mg prednisone Also had a temporal artery biopsy which was positive for temporal arteritis  RECENT HISTORY: Since he has started the prednisone he has not had any more episodes of amaurosis or headaches. The dose has been gradually reduced With his ESR improved on the last visit to 17 the dose was reduced to 40 mg Currently tolerating prednisone  well except for  increase in appetite and carbohydrate craving.  He is trying to watch his diet and has minimized his weight gain since the last visit  He wants to use a medication to reduce his appetite, did not want to try Qsymia because of cost  Lab Results  Component Value Date   ESRSEDRATE 17 08/03/2013   Problem 2:  Puffiness of eyes and pain in the knees and shoulders today      Medication List       This list is accurate as of: 08/28/13  3:31 PM.  Always use your most recent med list.               ALPRAZolam 0.5 MG tablet  Commonly known as:  XANAX  Take 1 tablet (0.5 mg total) by mouth 3 (three) times daily as needed for anxiety.     amoxicillin 500 MG capsule  Commonly known as:  AMOXIL     aspirin 325 MG tablet  Take 325 mg by mouth daily. Stopped for procedure     atorvastatin 20 MG tablet  Commonly known as:  LIPITOR  Take 10 mg by mouth every morning.     buPROPion 150 MG 12 hr tablet  Commonly known as:  WELLBUTRIN SR  Take 1 tablet (150 mg total) by mouth every morning.     chlorpheniramine-HYDROcodone 10-8 MG/5ML Lqcr  Commonly known as:  TUSSIONEX PENNKINETIC ER  Take 5 mLs by mouth every 12  (twelve) hours as needed for cough.     cyclobenzaprine 10 MG tablet  Commonly known as:  FLEXERIL  Take 1 tablet (10 mg total) by mouth daily.     imipramine 10 MG tablet  Commonly known as:  TOFRANIL  TAKE 2 TABLETS AT BEDTIME.     levothyroxine 112 MCG tablet  Commonly known as:  SYNTHROID, LEVOTHROID  Take 112 mcg by mouth daily before breakfast.     Phentermine-Topiramate 3.75-23 MG Cp24  Commonly known as:  QSYMIA  Take 3.75 mg by mouth 1 day or 1 dose.     predniSONE 20 MG tablet  Commonly known as:  DELTASONE  Take 40 mg by mouth daily.     ROZEREM 8 MG tablet  Generic drug:  ramelteon     tamsulosin 0.4 MG Caps capsule  Commonly known as:  FLOMAX  Take 0.4 mg by mouth daily.     testosterone cypionate 200 MG/ML injection  Commonly known as:  DEPOTESTOTERONE CYPIONATE  Inject 200 mg into the muscle every 14 (fourteen) days.     venlafaxine XR 37.5 MG 24 hr capsule  Commonly known as:  EFFEXOR-XR  TAKE 1 CAPSULE DAILY WITH  FOOD.        Review of Systems He has a  history of mild hypertension; because of low normal blood pressure readings his medications have been stopped and blood pressure is still controlled  He was started on Rapaflo for his prostatism but he is having urgency and was given Myrbetriq samples by urologist  He had had herpes zoster on the right trigeminal early this year and has no further neurologic pains     Objective:   Physical Exam BP 126/82  Pulse 103  Temp(Src) 98.3 F (36.8 C)  Resp 12  Ht 5' 8"  (1.727 m)  Wt 200 lb 1.6 oz (90.765 kg)  BMI 30.43 kg/m2  SpO2 95%  Minimal puffiness of the lower eyelids No pedal edema  Assessment:     Temporal arteritis/giant cell arteritis, biopsy-proven Good response to  prednisone And no further symptoms He is currently on 40 mg prednisone and  Tolerating it well  Nonspecific arthralgia; advised him that mild puffiness of the eyes is not unusual with prednisone     Plan:      Check ESR and consider reducing the dose to 30 mg if  about the same or lower  He was given a prescription for 10 mg prednisone since she will be needing lower doses subsequently  Continue diet and exercise for preventing weight gain  Casady Voshell

## 2013-08-28 NOTE — Patient Instructions (Signed)
Rx to be decided  May take 2 Welbutrin in ams

## 2013-08-29 ENCOUNTER — Other Ambulatory Visit: Payer: Self-pay | Admitting: *Deleted

## 2013-08-29 LAB — SEDIMENTATION RATE: SED RATE: 13 mm/h (ref 0–22)

## 2013-08-29 MED ORDER — PHENTERMINE-TOPIRAMATE ER 3.75-23 MG PO CP24: 3.7500 mg | ORAL_CAPSULE | ORAL | Status: DC

## 2013-08-29 NOTE — Progress Notes (Signed)
Quick Note:  Please let patient know that the sedimentation rate result is normal and he can reduce prednisone to 30 mg until next visit ______

## 2013-09-09 ENCOUNTER — Emergency Department (HOSPITAL_COMMUNITY)
Admission: EM | Admit: 2013-09-09 | Discharge: 2013-09-09 | Disposition: A | Discharge status: Home / Self Care | Payer: Medicare Other | Attending: Emergency Medicine | Admitting: Emergency Medicine

## 2013-09-09 ENCOUNTER — Emergency Department (HOSPITAL_COMMUNITY): Payer: Medicare Other

## 2013-09-09 ENCOUNTER — Ambulatory Visit: Payer: Medicare Other

## 2013-09-09 ENCOUNTER — Encounter (HOSPITAL_COMMUNITY): Payer: Self-pay | Admitting: Emergency Medicine

## 2013-09-09 ENCOUNTER — Ambulatory Visit (INDEPENDENT_AMBULATORY_CARE_PROVIDER_SITE_OTHER): Payer: Medicare Other | Admitting: Family Medicine

## 2013-09-09 VITALS — BP 108/52 | HR 126 | Temp 99.3°F | Ht 68.5 in | Wt 204.0 lb

## 2013-09-09 DIAGNOSIS — R079 Chest pain, unspecified: Secondary | ICD-10-CM

## 2013-09-09 DIAGNOSIS — Z87891 Personal history of nicotine dependence: Secondary | ICD-10-CM | POA: Diagnosis not present

## 2013-09-09 DIAGNOSIS — Z87448 Personal history of other diseases of urinary system: Secondary | ICD-10-CM | POA: Diagnosis not present

## 2013-09-09 DIAGNOSIS — R000 Tachycardia, unspecified: Secondary | ICD-10-CM | POA: Diagnosis not present

## 2013-09-09 DIAGNOSIS — E039 Hypothyroidism, unspecified: Secondary | ICD-10-CM | POA: Diagnosis not present

## 2013-09-09 DIAGNOSIS — H53129 Transient visual loss, unspecified eye: Secondary | ICD-10-CM | POA: Insufficient documentation

## 2013-09-09 DIAGNOSIS — Z7982 Long term (current) use of aspirin: Secondary | ICD-10-CM | POA: Diagnosis not present

## 2013-09-09 DIAGNOSIS — R0602 Shortness of breath: Secondary | ICD-10-CM | POA: Diagnosis not present

## 2013-09-09 DIAGNOSIS — M129 Arthropathy, unspecified: Secondary | ICD-10-CM | POA: Diagnosis not present

## 2013-09-09 DIAGNOSIS — E7800 Pure hypercholesterolemia, unspecified: Secondary | ICD-10-CM | POA: Diagnosis not present

## 2013-09-09 DIAGNOSIS — F411 Generalized anxiety disorder: Secondary | ICD-10-CM | POA: Diagnosis not present

## 2013-09-09 DIAGNOSIS — Z79899 Other long term (current) drug therapy: Secondary | ICD-10-CM | POA: Insufficient documentation

## 2013-09-09 DIAGNOSIS — F329 Major depressive disorder, single episode, unspecified: Secondary | ICD-10-CM | POA: Insufficient documentation

## 2013-09-09 DIAGNOSIS — D72829 Elevated white blood cell count, unspecified: Secondary | ICD-10-CM

## 2013-09-09 DIAGNOSIS — Z8619 Personal history of other infectious and parasitic diseases: Secondary | ICD-10-CM | POA: Diagnosis not present

## 2013-09-09 DIAGNOSIS — Z8719 Personal history of other diseases of the digestive system: Secondary | ICD-10-CM | POA: Insufficient documentation

## 2013-09-09 DIAGNOSIS — F3289 Other specified depressive episodes: Secondary | ICD-10-CM | POA: Diagnosis not present

## 2013-09-09 DIAGNOSIS — R0789 Other chest pain: Secondary | ICD-10-CM | POA: Diagnosis not present

## 2013-09-09 DIAGNOSIS — R6883 Chills (without fever): Secondary | ICD-10-CM | POA: Diagnosis not present

## 2013-09-09 DIAGNOSIS — IMO0002 Reserved for concepts with insufficient information to code with codable children: Secondary | ICD-10-CM | POA: Insufficient documentation

## 2013-09-09 DIAGNOSIS — J189 Pneumonia, unspecified organism: Secondary | ICD-10-CM | POA: Diagnosis not present

## 2013-09-09 DIAGNOSIS — Z8679 Personal history of other diseases of the circulatory system: Secondary | ICD-10-CM | POA: Insufficient documentation

## 2013-09-09 DIAGNOSIS — R0682 Tachypnea, not elsewhere classified: Secondary | ICD-10-CM | POA: Diagnosis not present

## 2013-09-09 HISTORY — DX: Other giant cell arteritis: M31.6

## 2013-09-09 HISTORY — DX: Benign prostatic hyperplasia without lower urinary tract symptoms: N40.0

## 2013-09-09 LAB — POCT CBC
Granulocyte percent: 88.2 %G — AB (ref 37–80)
HCT, POC: 44.9 % (ref 43.5–53.7)
Hemoglobin: 14.1 g/dL (ref 14.1–18.1)
Lymph, poc: 1 (ref 0.6–3.4)
MCH, POC: 30.9 pg (ref 27–31.2)
MCHC: 31.4 g/dL — AB (ref 31.8–35.4)
MCV: 98.2 fL — AB (ref 80–97)
MID (CBC): 0.4 (ref 0–0.9)
MPV: 8.1 fL (ref 0–99.8)
PLATELET COUNT, POC: 217 10*3/uL (ref 142–424)
POC Granulocyte: 10.1 — AB (ref 2–6.9)
POC LYMPH PERCENT: 8.7 %L — AB (ref 10–50)
POC MID %: 3.1 % (ref 0–12)
RBC: 4.57 M/uL — AB (ref 4.69–6.13)
RDW, POC: 16 %
WBC: 11.4 10*3/uL — AB (ref 4.6–10.2)

## 2013-09-09 LAB — POCT URINALYSIS DIPSTICK
BILIRUBIN UA: NEGATIVE
Blood, UA: NEGATIVE
GLUCOSE UA: NEGATIVE
Ketones, UA: NEGATIVE
LEUKOCYTES UA: NEGATIVE
NITRITE UA: NEGATIVE
PH UA: 5.5
Protein, UA: NEGATIVE
Spec Grav, UA: 1.015
Urobilinogen, UA: 0.2

## 2013-09-09 LAB — BASIC METABOLIC PANEL
BUN: 34 mg/dL — ABNORMAL HIGH (ref 6–23)
CHLORIDE: 101 meq/L (ref 96–112)
CO2: 29 meq/L (ref 19–32)
Calcium: 8.9 mg/dL (ref 8.4–10.5)
Creatinine, Ser: 1.42 mg/dL — ABNORMAL HIGH (ref 0.50–1.35)
GFR calc Af Amer: 53 mL/min — ABNORMAL LOW (ref >90)
GFR calc non Af Amer: 46 mL/min — ABNORMAL LOW (ref >90)
Glucose, Bld: 99 mg/dL (ref 70–99)
POTASSIUM: 4.1 meq/L (ref 3.7–5.3)
SODIUM: 138 meq/L (ref 137–147)

## 2013-09-09 LAB — POCT I-STAT TROPONIN I: Troponin i, poc: 0.01 ng/mL (ref 0.00–0.08)

## 2013-09-09 LAB — CG4 I-STAT (LACTIC ACID): LACTIC ACID, VENOUS: 1.22 mmol/L (ref 0.5–2.2)

## 2013-09-09 LAB — POCT UA - MICROSCOPIC ONLY
Casts, Ur, LPF, POC: NEGATIVE
Crystals, Ur, HPF, POC: NEGATIVE
Mucus, UA: NEGATIVE
YEAST UA: NEGATIVE

## 2013-09-09 LAB — POCT INFLUENZA A/B
INFLUENZA B, POC: NEGATIVE
Influenza A, POC: NEGATIVE

## 2013-09-09 LAB — PRO B NATRIURETIC PEPTIDE: PRO B NATRI PEPTIDE: 98.9 pg/mL (ref 0–450)

## 2013-09-09 MED ORDER — OSELTAMIVIR PHOSPHATE 75 MG PO CAPS: 75.0000 mg | ORAL_CAPSULE | Freq: Two times a day (BID) | ORAL | Status: DC

## 2013-09-09 MED ORDER — LEVOFLOXACIN 500 MG PO TABS: 500.0000 mg | ORAL_TABLET | Freq: Once | ORAL | Status: DC

## 2013-09-09 MED ORDER — IOHEXOL 350 MG/ML SOLN
100.0000 mL | Freq: Once | INTRAVENOUS | Status: AC | PRN
  Administered 2013-09-09: 100 mL via INTRAVENOUS

## 2013-09-09 MED ORDER — LEVOFLOXACIN 500 MG PO TABS
500.0000 mg | ORAL_TABLET | Freq: Once | ORAL | Status: AC
  Administered 2013-09-09: 500 mg via ORAL
  Filled 2013-09-09: qty 1

## 2013-09-09 MED ORDER — SODIUM CHLORIDE 0.9 % IV BOLUS (SEPSIS)
500.0000 mL | Freq: Once | INTRAVENOUS | Status: AC
  Administered 2013-09-09: 500 mL via INTRAVENOUS

## 2013-09-09 MED ORDER — OSELTAMIVIR PHOSPHATE 75 MG PO CAPS
75.0000 mg | ORAL_CAPSULE | Freq: Every day | ORAL | Status: DC
  Administered 2013-09-09: 75 mg via ORAL
  Filled 2013-09-09: qty 1

## 2013-09-09 NOTE — Progress Notes (Addendum)
Subjective:    Patient ID: Benjamin Hahn, male    DOB: [birth year], 80 y.o.   MRN: [id number]  HPI This chart was scribed for Merri Ray, MD by Thea Alken, ED Scribe. This patient was seen in room 6 and the patient's care was started at 10:40 AM.  HPI Comments: Benjamin Hahn is a 80 y.o. male who presents to the [facility] complaining of "violent" chills this morning with SOB. Pt states that after he took a shower this morning he was experiencing violent chills and reports his wife said his faced turned white. Pt has had SOB and intermittent CP for about one week. He reports that his CP is in the center of the chest and describes the pain as heaviness, but intermittent - lasting only a minute or two.  He denies having CP currently. n. He does not recall having nausea with CP. Pt reports he also has been having joint pain. He states that he has not smoked in years and that he consumes alcohol 2-3 times weekly. He reports he used to consume about 5 drinks in the past. Pt has recent history of temporal arteritis in which he takes prednisone. He has been taking prednisone for about one month. Pt also reports he recently had shingles. He states he has h/o asthma as a child and denies any recent lung problems or medications for his lungs. Pt denies h/o MI or other heart disease.  Pt reports that he had his flu shot this year. He deines cough, fever, nausea, emesis, bladder and bowel incontinence. Has had urinary symptoms for some time - urgency, but has been followed by his primary provider and denies recent changes.   Pt was seen acutely  - brought from waiting room then triage to room 6 and I was advised to see acutely due to dyspnea, and triage initial O2 stat 91%. increased to 97% in the room.  No recent prolonged car travel or air travel, no recent calf pain or swelling.    Patient Active Problem List   Diagnosis Date Noted  . Temporal arteritis 06/16/2013  .  Amaurosis fugax 06/01/2013  . Unspecified hypothyroidism 03/14/2013  . Other and unspecified hyperlipidemia 03/14/2013  . BPH (benign prostatic hypertrophy) 03/14/2013  . Hypogonadism, male 03/14/2013  . Erectile dysfunction 03/14/2013   Past Medical History  Diagnosis Date  . Hypothyroid   . Depression 1990s  . Shingles May 2014    "Right face, still has some"  . Hypercholesteremia     controled  . Anxiety   . Transient blindness of both eyes   . GERD (gastroesophageal reflux disease)     occasional  . Arthritis     might be in back, no problems   Past Surgical History  Procedure Laterality Date  . None    . No past surgeries    . Artery biopsy Right 06/20/2013    Procedure: BIOPSY TEMPORAL ARTERY RIGHT;  Surgeon: Earnstine Regal, MD;  Location: [facility]S;  Service: General;  Laterality: Right;   No Known Allergies Prior to Admission medications   Medication Sig Start Date End Date Taking? Authorizing Provider  ALPRAZolam Duanne Moron) 0.5 MG tablet Take 1 tablet (0.5 mg total) by mouth 3 (three) times daily as needed for anxiety. 07/27/13   Elayne Snare, MD  aspirin 325 MG tablet Take 325 mg by mouth daily. Stopped for procedure    Historical Provider, MD  atorvastatin (LIPITOR) 20 MG tablet Take 10  mg by mouth every morning.    Historical Provider, MD  buPROPion (WELLBUTRIN SR) 150 MG 12 hr tablet Take 1 tablet (150 mg total) by mouth every morning. 06/19/13   Reather Littler, MD  cyclobenzaprine (FLEXERIL) 10 MG tablet Take 1 tablet (10 mg total) by mouth daily. 07/31/13   Reather Littler, MD  imipramine (TOFRANIL) 10 MG tablet TAKE 2 TABLETS AT BEDTIME. 08/08/13   Reather Littler, MD  levothyroxine (SYNTHROID, LEVOTHROID) 112 MCG tablet Take 112 mcg by mouth daily before breakfast.    Historical Provider, MD  Phentermine-Topiramate (QSYMIA) 3.75-23 MG CP24 Take 3.75 mg by mouth 1 day or 1 dose. 08/29/13   Reather Littler, MD  predniSONE (DELTASONE) 10 MG tablet Take 4 tablets (40 mg total) by mouth daily.  08/28/13   Reather Littler, MD  ROZEREM 8 MG tablet  05/18/13   Historical Provider, MD  tamsulosin (FLOMAX) 0.4 MG CAPS capsule Take 0.4 mg by mouth daily.    Historical Provider, MD  testosterone cypionate (DEPOTESTOTERONE CYPIONATE) 200 MG/ML injection Inject 200 mg into the muscle every 14 (fourteen) days. 05/08/13   Historical Provider, MD  venlafaxine XR (EFFEXOR-XR) 37.5 MG 24 hr capsule TAKE 1 CAPSULE DAILY WITH FOOD. 08/21/13   Reather Littler, MD   History   Social History  . Marital Status: Married    Spouse Name: N/A    Number of Children: N/A  . Years of Education: N/A   Occupational History  . Not on file.   Social History Main Topics  . Smoking status: Former Smoker    Types: Cigarettes    Quit date: 08/24/1966  . Smokeless tobacco: Never Used  . Alcohol Use: 0.0 oz/week     Comment: socially  . Drug Use: No  . Sexual Activity: Not on file   Other Topics Concern  . Not on file   Social History Narrative   Currently works as a Scientist, clinical (histocompatibility and immunogenetics).  Lives with wife and they have two healthy daughters.    Review of Systems  Constitutional: Positive for chills. Negative for fever.  Respiratory: Positive for apnea and shortness of breath. Negative for cough.   Cardiovascular: Positive for chest pain.  Gastrointestinal: Negative for nausea, vomiting, constipation, blood in stool and abdominal distention.  Genitourinary: Positive for urgency (longstanding. ). Negative for dysuria, frequency and difficulty urinating.       Objective:   Physical Exam  Vitals reviewed. Constitutional: He is oriented to person, place, and time. He appears well-developed and well-nourished.  HENT:  Head: Normocephalic and atraumatic.  Eyes: EOM are normal. Pupils are equal, round, and reactive to light.  Neck: No JVD present. Carotid bruit is not present.  Cardiovascular: Regular rhythm and normal heart sounds.  Tachycardia present.   No murmur heard. Heart rate 100  Pulmonary/Chest: Breath sounds  normal. No accessory muscle usage. Tachypnea noted. No respiratory distress. He has no wheezes. He has no rales.  Distant breath sounds  Abdominal: Soft. He exhibits distension ( slight). He exhibits no fluid wave. There is no tenderness.  Musculoskeletal: He exhibits no edema.  Neurological: He is alert and oriented to person, place, and time.  Skin: Skin is warm and dry.  Psychiatric: He has a normal mood and affect.   Filed Vitals:   09/09/13 1037 09/09/13 1055  BP: 108/52   Pulse: 126   Temp: 99.3 F (37.4 C)   TempSrc: Oral   Height: 5' 8.5" (1.74 m)   Weight: 204 lb (92.534 kg)  SpO2: 97% 94%    O2 [place] placed at 2l House - increased o2 sat to 98%.   DIAGNOSTIC STUDIES: EKG: sinus tachycardia, no acute findings noted.  Results for orders placed in visit on 09/09/13  POCT CBC      Result Value Range   WBC 11.4 (*) 4.6 - 10.2 K/uL   Lymph, poc 1.0  0.6 - 3.4   POC LYMPH PERCENT 8.7 (*) 10 - 50 %L   MID (cbc) 0.4  0 - 0.9   POC MID % 3.1  0 - 12 %M   POC Granulocyte 10.1 (*) 2 - 6.9   Granulocyte percent 88.2 (*) 37 - 80 %G   RBC 4.57 (*) 4.69 - 6.13 M/uL   Hemoglobin 14.1  14.1 - 18.1 g/dL   HCT, POC 44.9  43.5 - 53.7 %   MCV 98.2 (*) 80 - 97 fL   MCH, POC 30.9  27 - 31.2 pg   MCHC 31.4 (*) 31.8 - 35.4 g/dL   RDW, POC 16.0     Platelet Count, POC 217  142 - 424 K/uL   MPV 8.1  0 - 99.8 fL  POCT INFLUENZA A/B      Result Value Range   Influenza A, POC Negative     Influenza B, POC Negative    POCT UA - MICROSCOPIC ONLY      Result Value Range   WBC, Ur, HPF, POC 0-2     RBC, urine, microscopic 0-1     Bacteria, U Microscopic TRACE     Mucus, UA NEG     Epithelial cells, urine per micros 1-2     Crystals, Ur, HPF, POC NEG     Casts, Ur, LPF, POC NEG     Yeast, UA NEG    POCT URINALYSIS DIPSTICK      Result Value Range   Color, UA YELLOW     Clarity, UA CLEAR     Glucose, UA NEG     Bilirubin, UA NEG     Ketones, UA NEG     Spec Grav, UA 1.015     Blood,  UA NEG     pH, UA 5.5     Protein, UA NEG     Urobilinogen, UA 0.2     Nitrite, UA NEG     Leukocytes, UA Negative      [facility] reading (PRIMARY) by  Dr. Carlota Raspberry: CXR: increase markings and vascularity left greater than right with increase markings inferiorly.   12:04 PM.  Discussed results. Still tachypneic with shortened sentences in room.  Discussed possible early CAP, or influenza with false negative test. Further hx of prostate infection a month or so ago and completed antibiotics (cipro, amox), with recheck off abx that was normal by his report.       Assessment & Plan:  Benjamin Hahn is a 80 y.o. male SOB (shortness of breath) - Plan: EKG 12-Lead, POCT CBC, POCT Influenza A/B, POCT UA - Microscopic Only, POCT urinalysis dipstick, DG Chest 2 View  Chest pain  Tachypnea  Intermittent chest pain for past week, fleeting sensation, but now with dyspnea past 2 days, shaking chills today.  Possible early CAP or underlying flu and secondary pneumonia with elevated WBC.  Hx of BPH and uti vs prostatitis prior, but U/a ok today. Initial low o2 sat - improved and stable on O2 [place] - 2 liters. No acute findings on EKG, but noted intermittent chest pain for past week.  Transferred to [facility] by EMS for further eval. , IV placed L ac for access. 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    No orders of the defined types were placed in this encounter.   There are no Patient Instructions on file for this visit.     I personally performed the services described in this documentation, which was scribed in my presence. The recorded information has been reviewed and considered, and addended by me as needed.    12:20 PM Charge nurse advised, transition of care to EMS.

## 2013-09-09 NOTE — ED Provider Notes (Signed)
CSN: [id number]     Arrival date & time 09/09/13  1239 History   First MD Initiated Contact with Patient 09/09/13 1248     Chief Complaint  Patient presents with  . Shortness of Breath  . Shaking  . Chest Pain    HPI The patient was referred to the emergency room after being evaluated in urgent care this morning. Patient states in the last few weeks he gets in trouble with shortness of breath. He's also had intermittent episodes of chest discomfort. He has noticed it primarily with exertion. He has not had any trouble with cough or congestion. Today however he woke up and had significant shaking chills. His wife states that he also looked very pale. Patient went to an urgent care where he had laboratory testing a chest x-ray. He appeared to persistently tachycardic and he was sent to the emergency room for further treatment and evaluation. Past Medical History  Diagnosis Date  . Hypothyroid   . Depression 1990s  . Shingles May 2014    "Right face, still has some"  . Hypercholesteremia     controled  . Anxiety   . Transient blindness of both eyes   . GERD (gastroesophageal reflux disease)     occasional  . Arthritis     might be in back, no problems  . Temporal arteritis   . BPH (benign prostatic hyperplasia)    Past Surgical History  Procedure Laterality Date  . None    . No past surgeries    . Artery biopsy Right 06/20/2013    Procedure: BIOPSY TEMPORAL ARTERY RIGHT;  Surgeon: Earnstine Regal, MD;  Location: [facility];  Service: General;  Laterality: Right;   Family History  Problem Relation Age of Onset  . Stroke Father     Died, 80s  . Stroke Sister     Living, 22  . Heart disease Mother     Died, 32  . Healthy Daughter    History  Substance Use Topics  . Smoking status: Former Smoker    Types: Cigarettes    Quit date: 08/24/1966  . Smokeless tobacco: Never Used  . Alcohol Use: 0.0 oz/week     Comment: socially    Review of Systems  All other systems reviewed and  are negative.    Allergies  Review of patient's allergies indicates no known allergies.  Home Medications   Current Outpatient Rx  Name  Route  Sig  Dispense  Refill  . ALPRAZolam (XANAX) 0.5 MG tablet   Oral   Take 0.5 mg by mouth 3 (three) times daily as needed for anxiety or sleep.         [name] aspirin 325 MG tablet   Oral   Take 325 mg by mouth daily at 10 pm.          . atorvastatin (LIPITOR) 20 MG tablet   Oral   Take 10 mg by mouth daily at 10 pm.          . buPROPion (WELLBUTRIN SR) 150 MG 12 hr tablet   Oral   Take 150 mg by mouth every morning.         . cyclobenzaprine (FLEXERIL) 10 MG tablet   Oral   Take 10 mg by mouth daily.         [name] imipramine (TOFRANIL) 10 MG tablet   Oral   Take 10 mg by mouth at bedtime.         [name] levothyroxine (SYNTHROID,  LEVOTHROID) 112 MCG tablet   Oral   Take 112 mcg by mouth daily before breakfast.         . Phentermine-Topiramate 3.75-23 MG CP24   Oral   Take 1 tablet by mouth daily.          . predniSONE (DELTASONE) 10 MG tablet   Oral   Take 30 mg by mouth daily.         [name] ROZEREM 8 MG tablet   Oral   Take 8 mg by mouth at bedtime. As needed for sleep         . venlafaxine XR (EFFEXOR-XR) 37.5 MG 24 hr capsule   Oral   Take 37.5 mg by mouth daily with breakfast.         . levofloxacin (LEVAQUIN) 500 MG tablet   Oral   Take 1 tablet (500 mg total) by mouth once.   10 tablet   0   . oseltamivir (TAMIFLU) 75 MG capsule   Oral   Take 1 capsule (75 mg total) by mouth 2 (two) times daily.   9 capsule   0    BP 124/59  Pulse 113  Temp(Src) 98.2 F (36.8 C) (Oral)  Resp 26  SpO2 95% Physical Exam  Nursing note and vitals reviewed. Constitutional: He appears well-developed and well-nourished. No distress.  HENT:  Head: Normocephalic and atraumatic.  Right Ear: External ear normal.  Left Ear: External ear normal.  Eyes: Conjunctivae are normal. Right eye exhibits no discharge. Left  eye exhibits no discharge. No scleral icterus.  Neck: Neck supple. No tracheal deviation present.  Cardiovascular: Regular rhythm and intact distal pulses.  Tachycardia present.   Pulmonary/Chest: Breath sounds normal. No stridor. Tachypnea noted. No respiratory distress. He has no wheezes. He has no rales.  Abdominal: Soft. Bowel sounds are normal. He exhibits no distension. There is no tenderness. There is no rebound and no guarding.  Musculoskeletal: He exhibits no edema and no tenderness.  Neurological: He is alert. He has normal strength. No cranial nerve deficit (no facial droop, extraocular movements intact, no slurred speech) or sensory deficit. He exhibits normal muscle tone. He displays no seizure activity. Coordination normal.  Skin: Skin is warm and dry. No rash noted.  Psychiatric: He has a normal mood and affect.    ED Course  Procedures (including critical care time) Labs Review Labs from the urgent care were reviewed. Patient had a normal particular CBC with exception of white count elevated at 11.  Patient had a negative rapid flu test. The patient also had a unremarkable urinalysis.  Outpatient chest x-ray  Labs Reviewed  BASIC METABOLIC PANEL - Abnormal; Notable for the following:    BUN 34 (*)    Creatinine, Ser 1.42 (*)    GFR calc non Af Amer 46 (*)    GFR calc Af Amer 53 (*)    All other components within normal limits  PRO B NATRIURETIC PEPTIDE  CG4 I-STAT (LACTIC ACID)  POCT I-STAT TROPONIN I   Imaging Review Dg Chest 2 View  09/09/2013   CLINICAL DATA:  Shortness of breath.  EXAM: CHEST  2 VIEW  COMPARISON:  No priors.  FINDINGS: Ill-defined nodular opacity projecting over the right mid lung in the region of the anterior aspect of the right third rib and the posterior aspect of the right seventh rib. This is not confidently identified on the lateral projection. Low lung volumes. Linear bibasilar opacities favored to reflect areas of subsegmental atelectasis  and/or scarring. No definite acute consolidative airspace disease. No pleural effusions. No evidence of pulmonary edema. Heart size is normal. Mediastinal contours are unremarkable.  IMPRESSION: 1. Low lung volumes with probable bibasilar subsegmental atelectasis or scarring. 2. Ill-defined nodular opacity projecting over the right mid lung. This may simply represent overlapping vascular structures with adjacent ribs, however, the possibility of an underlying pulmonary nodule is not excluded, and follow-up evaluation of the nonemergent chest CT in the near future is recommended to exclude an underlying neoplasm. These results will be called to the ordering clinician or representative by the Radiologist Assistant, and communication documented in the PACS Dashboard.   Electronically Signed   By: Vinnie Langton M.D.   On: 09/09/2013 13:30   Ct Angio Chest W/cm &/or Wo Cm  09/09/2013   CLINICAL DATA:  Shortness of breath and chest pain.  EXAM: CT ANGIOGRAPHY CHEST WITH CONTRAST  TECHNIQUE: Multidetector CT imaging of the chest was performed using the standard protocol during bolus administration of intravenous contrast. Multiplanar CT image reconstructions including MIPs were obtained to evaluate the vascular anatomy.  CONTRAST:  139mL OMNIPAQUE IOHEXOL 350 MG/ML SOLN  COMPARISON:  Two-view chest x-ray 09/09/2013.  FINDINGS: Pulmonary arterial opacification is satisfactory. There is no focal filling defect to suggest pulmonary emboli. The heart size is normal. Atherosclerotic calcifications are present within the coronary arteries. There is no significant pleural or pericardial effusion. No significant mediastinal or axillary adenopathy is present. The thoracic inlet and upper abdomen are unremarkable.  Mild tree-in-bud micronodularity is noted the posterior aspect of the right upper lobe. Additional scarring or airspace disease is present in the right lower lobe. Minimal atelectasis is evident at the left base. No  other focal nodule, mass, or airspace disease is present.  The bone windows demonstrate fusion across multiple anterior osteophytes compatible with DISH. No focal lytic or blastic lesions are evident.  Review of the MIP images confirms the above findings.  IMPRESSION: 1. Tree in bud formation with micronodularity corresponds to the area of density on the chest radiograph. This likely represents acute inflammatory or infectious process. Atypical infections are considered. 2. Additional airspace or scarring at the right lung base. 3. No evidence for pulmonary embolus. 4. Atherosclerotic disease of the aorta. 5. Coronary artery disease.   Electronically Signed   By: Lawrence Santiago M.D.   On: 09/09/2013 15:48    EKG Interpretation    Date/Time:  Saturday September 09 2013 12:39:54 EST Ventricular Rate:  119 PR Interval:  146 QRS Duration: 77 QT Interval:  317 QTC Calculation: 446 R Axis:   74 Text Interpretation:  Sinus tachycardia Since last tracing rate faster Confirmed by Lenora Gomes  MD-J, Kessler Solly ([id number]) on 09/09/2013 1:16:42 PM           Medications  levofloxacin (LEVAQUIN) tablet 500 mg (not administered)  oseltamivir (TAMIFLU) capsule 75 mg (not administered)  sodium chloride 0.9 % bolus 500 mL (0 mLs Intravenous Stopped 09/09/13 1449)  iohexol (OMNIPAQUE) 350 MG/ML injection 100 mL (100 mLs Intravenous Contrast Given 09/09/13 1512)    MDM   1. Atypical pneumonia    Pt's test results suggests atypical pneumonia.  Cardiac enzymes and BNP are normal.  No PE on CT scan.  Pt's oxygen saturation is normal.  He is still tachycardic at 110 but it has improved.  He continues to be tachypnic but does not appear in any distress.  I discussed hospitalization with the patient considering his age and respiratory symptoms.  Pt states he  would like to go home and try oral medications.  He understands to return if he feels like he is getting worse and should follow up with his doctor this week.    Kathalene Frames, MD 09/09/13 [phone]

## 2013-09-09 NOTE — ED Notes (Signed)
Bed: WA09 Expected date: 09/09/13 Expected time: 12:25 PM Means of arrival: Ambulance Comments: [facility] form urgent care

## 2013-09-09 NOTE — Discharge Instructions (Signed)
Pneumonia, Adult °Pneumonia is an infection of the lungs. It may be caused by a germ (virus or bacteria). Some types of pneumonia can spread easily from person to person. This can happen when you cough or sneeze. °HOME CARE °· Only take medicine as told by your doctor. °· Take your medicine (antibiotics) as told. Finish it even if you start to feel better. °· Do not smoke. °· You may use a vaporizer or humidifier in your room. This can help loosen thick spit (mucus). °· Sleep so you are almost sitting up (semi-upright). This helps reduce coughing. °· Rest. °A shot (vaccine) can help prevent pneumonia. Shots are often advised for: °· People over 65 years old. °· Patients on chemotherapy. °· People with long-term (chronic) lung problems. °· People with immune system problems. °GET HELP RIGHT AWAY IF:  °· You are getting worse. °· You cannot control your cough, and you are losing sleep. °· You cough up blood. °· Your pain gets worse, even with medicine. °· You have a fever. °· Any of your problems are getting worse, not better. °· You have shortness of breath or chest pain. °MAKE SURE YOU:  °· Understand these instructions. °· Will watch your condition. °· Will get help right away if you are not doing well or get worse. °Document Released: 01/27/2008 Document Revised: 11/02/2011 Document Reviewed: 10/31/2010 °[employer]® Patient Information ©2014 [employer]. ° °

## 2013-09-09 NOTE — ED Notes (Signed)
Per EMS pt comes from urgent care c/o shob, shakes and intermittent chest pain that has been going on for 2-3 weeks.  Pt states that he has a headache right now.  Pt states that he has temporal arteritis and had biopsy done couple of weeks ago.

## 2013-09-13 ENCOUNTER — Telehealth: Payer: Self-pay | Admitting: *Deleted

## 2013-09-13 NOTE — Telephone Encounter (Signed)
Pt wanted to let you know that he was in the ER at [facility] Saturday, he was dx with atypical pneumonia, he said his breathing is heavy and he's still very weak, he has an appt in early February to see you, but wasn't sure if he needed to come in sooner with him going to the ER? Please advise

## 2013-09-13 NOTE — Telephone Encounter (Signed)
Patient says he wants to monitor the situation and see how he does, but will call back if symptoms worsen

## 2013-09-13 NOTE — Telephone Encounter (Signed)
Ok to see me in office

## 2013-09-26 ENCOUNTER — Other Ambulatory Visit: Payer: Self-pay | Admitting: *Deleted

## 2013-09-26 DIAGNOSIS — M316 Other giant cell arteritis: Secondary | ICD-10-CM

## 2013-09-26 DIAGNOSIS — E039 Hypothyroidism, unspecified: Secondary | ICD-10-CM

## 2013-09-28 ENCOUNTER — Other Ambulatory Visit (INDEPENDENT_AMBULATORY_CARE_PROVIDER_SITE_OTHER): Payer: Medicare Other

## 2013-09-28 DIAGNOSIS — E039 Hypothyroidism, unspecified: Secondary | ICD-10-CM

## 2013-09-28 DIAGNOSIS — M316 Other giant cell arteritis: Secondary | ICD-10-CM | POA: Diagnosis not present

## 2013-09-28 LAB — LIPID PANEL
CHOL/HDL RATIO: 2
Cholesterol: 188 mg/dL (ref 0–200)
HDL: 80.2 mg/dL (ref >39.00)
LDL Cholesterol: 94 mg/dL (ref 0–99)
Triglycerides: 70 mg/dL (ref 0.0–149.0)
VLDL: 14 mg/dL (ref 0.0–40.0)

## 2013-09-28 LAB — TSH: TSH: 0.46 u[IU]/mL (ref 0.35–5.50)

## 2013-09-28 LAB — T4, FREE: Free T4: 0.96 ng/dL (ref 0.60–1.60)

## 2013-09-28 LAB — SEDIMENTATION RATE: SED RATE: 13 mm/h (ref 0–22)

## 2013-10-03 ENCOUNTER — Ambulatory Visit (INDEPENDENT_AMBULATORY_CARE_PROVIDER_SITE_OTHER): Payer: Medicare Other | Admitting: Endocrinology

## 2013-10-03 ENCOUNTER — Encounter: Payer: Self-pay | Admitting: Endocrinology

## 2013-10-03 VITALS — BP 122/70 | HR 85 | Temp 98.2°F | Resp 16 | Ht 68.0 in | Wt 201.6 lb

## 2013-10-03 DIAGNOSIS — M316 Other giant cell arteritis: Secondary | ICD-10-CM | POA: Diagnosis not present

## 2013-10-03 DIAGNOSIS — E039 Hypothyroidism, unspecified: Secondary | ICD-10-CM

## 2013-10-03 MED ORDER — TADALAFIL 5 MG PO TABS: 5.0000 mg | ORAL_TABLET | Freq: Every day | ORAL | Status: DC | PRN

## 2013-10-03 MED ORDER — PHENTERMINE-TOPIRAMATE ER 7.5-46 MG PO CP24: 7.5000 mg | ORAL_CAPSULE | Freq: Every day | ORAL | Status: DC

## 2013-10-03 NOTE — Patient Instructions (Signed)
Prednisone 2 1/2 for 2 weeks then 2 daily  Qsymia 7.5mg  daily

## 2013-10-03 NOTE — Progress Notes (Signed)
Subjective:     Patient ID: Benjamin Hahn, male   DOB: [birth year], 80 y.o.   MRN: [id number]  Chief complaint: Followup of temporal arteritis  HPI  Background history: In early October he was having episodes of visual loss, starting in the left eye first and then subsequently bilateral He was also having a mild headache for about 10 days Because of his above symptoms and high ESR of 55 he was started on 60 mg prednisone Also had a temporal artery biopsy which was positive for temporal arteritis  RECENT HISTORY: Since starting prednisone he has not had any more episodes of amaurosis or headaches. The dose has been gradually reduced from initial dosage of 60 mg and last ESR was 13 With his ESR improved he is now taking 30 mg and ESR is again 13 Currently tolerating prednisone  well except for  increase in appetite and carbohydrate craving.   Lab Results  Component Value Date   ESRSEDRATE 13 09/28/2013   Problem 2:   Weight gain:  He is now trying low-dose of controlling appetite and weight and he thinks this is somewhat helpful. He wants to increase the dose and weight is slowly coming down. Has walked occasionally on treadmill  Wt Readings from Last 3 Encounters:  10/03/13 201 lb 9.6 oz (91.445 kg)  09/09/13 204 lb (92.534 kg)  08/28/13 200 lb 1.6 oz (90.765 kg)       Medication List       This list is accurate as of: 10/03/13  8:13 AM.  Always use your most recent med list.               ALPRAZolam 0.5 MG tablet  Commonly known as:  XANAX  Take 0.5 mg by mouth 3 (three) times daily as needed for anxiety or sleep.     aspirin 325 MG tablet  Take 325 mg by mouth daily at 10 pm.     atorvastatin 20 MG tablet  Commonly known as:  LIPITOR  Take 10 mg by mouth daily at 10 pm.     buPROPion 150 MG 12 hr tablet  Commonly known as:  WELLBUTRIN SR  Take 150 mg by mouth every morning.     clobetasol cream 0.05 %  Commonly known as:  TEMOVATE     cyclobenzaprine 10  MG tablet  Commonly known as:  FLEXERIL  Take 10 mg by mouth daily.     gabapentin 300 MG capsule  Commonly known as:  NEURONTIN     imipramine 10 MG tablet  Commonly known as:  TOFRANIL  Take 10 mg by mouth at bedtime.     levothyroxine 112 MCG tablet  Commonly known as:  SYNTHROID, LEVOTHROID  Take 112 mcg by mouth daily before breakfast.     Phentermine-Topiramate 3.75-23 MG Cp24  Take 1 tablet by mouth daily.     predniSONE 10 MG tablet  Commonly known as:  DELTASONE  Take 30 mg by mouth daily.     ROZEREM 8 MG tablet  Generic drug:  ramelteon  Take 8 mg by mouth at bedtime. As needed for sleep     tamsulosin 0.4 MG Caps capsule  Commonly known as:  FLOMAX     venlafaxine XR 37.5 MG 24 hr capsule  Commonly known as:  EFFEXOR-XR  Take 37.5 mg by mouth daily with breakfast.        Review of Systems He has a prior history of mild hypertension; because of low normal  blood pressure readings his medications have been stopped and blood pressure is still normal  He was started on Rapaflo for his prostatism was also given Myrbetriq by urologist  He is now complaining of low back and right sciatica pain  He was treated in ER for pneumonia. No cough but only rare wheezing. Also in ER he had mild increase in creatinine     Objective:   Physical Exam BP 122/70  Pulse 85  Temp(Src) 98.2 F (36.8 C)  Resp 16  Ht 5' 8"  (1.727 m)  Wt 201 lb 9.6 oz (91.445 kg)  BMI 30.66 kg/m2  SpO2 97%  No puffiness of face or cushingoid features  Lungs clear  Assessment:     Temporal arteritis/giant cell arteritis, biopsy-proven Good response to  prednisone with resolution of visual symptoms He is currently on 30 mg prednisone and Tolerating it well        Plan:      Will reduce the dose to 25 mg for 2 weeks and then 20 mg To have ESR checked in one month    Continue diet and exercise for preventing weight gain Increased Qsymia to 7.5/26 mg  Recheck renal function on  next visit  [facility]

## 2013-10-08 ENCOUNTER — Other Ambulatory Visit: Payer: Self-pay | Admitting: Endocrinology

## 2013-10-16 ENCOUNTER — Other Ambulatory Visit: Payer: Self-pay | Admitting: *Deleted

## 2013-10-16 MED ORDER — TAMSULOSIN HCL 0.4 MG PO CAPS: ORAL_CAPSULE | ORAL | Status: DC

## 2013-10-23 ENCOUNTER — Other Ambulatory Visit: Payer: Self-pay | Admitting: *Deleted

## 2013-10-23 MED ORDER — ALPRAZOLAM 0.5 MG PO TABS: 0.5000 mg | ORAL_TABLET | Freq: Three times a day (TID) | ORAL | Status: DC | PRN

## 2013-10-31 ENCOUNTER — Other Ambulatory Visit (INDEPENDENT_AMBULATORY_CARE_PROVIDER_SITE_OTHER): Payer: Medicare Other

## 2013-10-31 ENCOUNTER — Other Ambulatory Visit: Payer: Self-pay | Admitting: Endocrinology

## 2013-10-31 DIAGNOSIS — E291 Testicular hypofunction: Secondary | ICD-10-CM

## 2013-10-31 DIAGNOSIS — M316 Other giant cell arteritis: Secondary | ICD-10-CM

## 2013-10-31 LAB — BASIC METABOLIC PANEL
BUN: 26 mg/dL — AB (ref 6–23)
CALCIUM: 9 mg/dL (ref 8.4–10.5)
CO2: 28 mEq/L (ref 19–32)
Chloride: 106 mEq/L (ref 96–112)
Creatinine, Ser: 1.4 mg/dL (ref 0.4–1.5)
GFR: 52.86 mL/min — AB (ref >60.00)
Glucose, Bld: 91 mg/dL (ref 70–99)
Potassium: 3.8 mEq/L (ref 3.5–5.1)
SODIUM: 142 meq/L (ref 135–145)

## 2013-10-31 LAB — TESTOSTERONE: Testosterone: 0 ng/dL — ABNORMAL LOW (ref 350.00–890.00)

## 2013-10-31 LAB — SEDIMENTATION RATE: Sed Rate: 12 mm/hr (ref 0–22)

## 2013-11-07 DIAGNOSIS — Z85828 Personal history of other malignant neoplasm of skin: Secondary | ICD-10-CM | POA: Diagnosis not present

## 2013-11-07 DIAGNOSIS — D235 Other benign neoplasm of skin of trunk: Secondary | ICD-10-CM | POA: Diagnosis not present

## 2013-11-07 DIAGNOSIS — D485 Neoplasm of uncertain behavior of skin: Secondary | ICD-10-CM | POA: Diagnosis not present

## 2013-11-07 DIAGNOSIS — L819 Disorder of pigmentation, unspecified: Secondary | ICD-10-CM | POA: Diagnosis not present

## 2013-12-01 ENCOUNTER — Other Ambulatory Visit (INDEPENDENT_AMBULATORY_CARE_PROVIDER_SITE_OTHER): Payer: Medicare Other

## 2013-12-01 ENCOUNTER — Other Ambulatory Visit: Payer: Self-pay | Admitting: *Deleted

## 2013-12-01 DIAGNOSIS — E039 Hypothyroidism, unspecified: Secondary | ICD-10-CM

## 2013-12-01 DIAGNOSIS — M316 Other giant cell arteritis: Secondary | ICD-10-CM

## 2013-12-01 LAB — CBC
HEMATOCRIT: 36.9 % — AB (ref 39.0–52.0)
HEMOGLOBIN: 12.4 g/dL — AB (ref 13.0–17.0)
MCHC: 33.6 g/dL (ref 30.0–36.0)
MCV: 92.7 fl (ref 78.0–100.0)
PLATELETS: 218 10*3/uL (ref 150.0–400.0)
RBC: 3.98 Mil/uL — ABNORMAL LOW (ref 4.22–5.81)
RDW: 14.3 % (ref 11.5–14.6)
WBC: 6.8 10*3/uL (ref 4.5–10.5)

## 2013-12-01 LAB — COMPREHENSIVE METABOLIC PANEL
ALK PHOS: 33 U/L — AB (ref 39–117)
ALT: 19 U/L (ref 0–53)
AST: 19 U/L (ref 0–37)
Albumin: 3.6 g/dL (ref 3.5–5.2)
BILIRUBIN TOTAL: 0.5 mg/dL (ref 0.3–1.2)
BUN: 21 mg/dL (ref 6–23)
CO2: 30 mEq/L (ref 19–32)
CREATININE: 1.4 mg/dL (ref 0.4–1.5)
Calcium: 9.2 mg/dL (ref 8.4–10.5)
Chloride: 105 mEq/L (ref 96–112)
GFR: 53.3 mL/min — AB (ref >60.00)
Glucose, Bld: 87 mg/dL (ref 70–99)
Potassium: 3.9 mEq/L (ref 3.5–5.1)
Sodium: 142 mEq/L (ref 135–145)
Total Protein: 6 g/dL (ref 6.0–8.3)

## 2013-12-01 LAB — T4, FREE: Free T4: 1.11 ng/dL (ref 0.60–1.60)

## 2013-12-01 LAB — TSH: TSH: 0.03 u[IU]/mL — ABNORMAL LOW (ref 0.35–5.50)

## 2013-12-01 LAB — SEDIMENTATION RATE: Sed Rate: 12 mm/hr (ref 0–22)

## 2013-12-07 ENCOUNTER — Telehealth: Payer: Self-pay | Admitting: *Deleted

## 2013-12-07 NOTE — Telephone Encounter (Signed)
Patient has an appt with you on Monday but he was wanting to know if he could decrease the prednisone yet?  He's on 15 mg now

## 2013-12-07 NOTE — Telephone Encounter (Signed)
Noted patient is aware 

## 2013-12-07 NOTE — Telephone Encounter (Signed)
Reduce it to 12.5 mg daily, may need to use a 10 mg plus half of 5 mg

## 2013-12-11 ENCOUNTER — Ambulatory Visit (INDEPENDENT_AMBULATORY_CARE_PROVIDER_SITE_OTHER): Payer: Medicare Other | Admitting: Endocrinology

## 2013-12-11 ENCOUNTER — Encounter: Payer: Self-pay | Admitting: Endocrinology

## 2013-12-11 VITALS — BP 128/62 | HR 87 | Temp 98.1°F | Resp 14 | Ht 68.0 in | Wt 204.6 lb

## 2013-12-11 DIAGNOSIS — E039 Hypothyroidism, unspecified: Secondary | ICD-10-CM | POA: Diagnosis not present

## 2013-12-11 DIAGNOSIS — F329 Major depressive disorder, single episode, unspecified: Secondary | ICD-10-CM

## 2013-12-11 DIAGNOSIS — F3289 Other specified depressive episodes: Secondary | ICD-10-CM

## 2013-12-11 DIAGNOSIS — M316 Other giant cell arteritis: Secondary | ICD-10-CM | POA: Diagnosis not present

## 2013-12-11 DIAGNOSIS — E7800 Pure hypercholesterolemia, unspecified: Secondary | ICD-10-CM

## 2013-12-11 DIAGNOSIS — E291 Testicular hypofunction: Secondary | ICD-10-CM | POA: Diagnosis not present

## 2013-12-11 DIAGNOSIS — F32A Depression, unspecified: Secondary | ICD-10-CM

## 2013-12-11 MED ORDER — LEVOTHYROXINE SODIUM 88 MCG PO TABS: 88.0000 ug | ORAL_TABLET | Freq: Every day | ORAL | Status: DC

## 2013-12-11 NOTE — Progress Notes (Signed)
Subjective:     Patient ID: Benjamin Hahn, male   DOB: 08-23-1933, 80 y.o.   MRN: [id number]  Chief complaint: Feeling bad  HPI   He says he feels very depressed and anxious over the last month. Overall he feels relatively low. He may occasionally use some alcohol to help him through the day. Has been taking his Wellbutrin and Effexor as before but has not followed up with his psychiatrist. Also has not had any testosterone injections since January. Has been able to sleep well with his usual regimen of Flexeril, imipramine and rozerem  Hypothyroidism: This is long-standing and he is usually on a stable dose. However his TSH is unusually low now and was low normal on the last visit. No shakiness but does feel more anxious   Lab Results  Component Value Date   TSH 0.03* 12/01/2013     Temporal arteritis: Background history: In early October he was having episodes of visual loss, starting in the left eye first and then subsequently bilateral He was also having a mild headache for about 10 days Because of his above symptoms and high ESR of 55 he was started on 60 mg prednisone Also had a temporal artery biopsy which was positive for temporal arteritis RECENT HISTORY: Since starting prednisone he has not had any more episodes of amaurosis or headaches. The dose has been gradually reduced from initial dosage of 60 mg  With his ESR improved he is now taking 12.5 mg and ESR has been stable at 12-13 Currently tolerating prednisone without side effects  Lab Results  Component Value Date   ESRSEDRATE 12 12/01/2013    Weight gain:  He is not able to afford the Qsymia and is trying to exercise fairly regularly to help his weight.  He has been walking on the treadmill almost daily and is generally trying to watch his diet  Wt Readings from Last 3 Encounters:  12/11/13 204 lb 9.6 oz (92.806 kg)  10/03/13 201 lb 9.6 oz (91.445 kg)  09/09/13 204 lb (92.534 kg)    Long-standing  hypogonadism: He has been treated by his urologist. Has been taking testosterone injections for about 2 years and was doing fairly well with this. He says he was told by the neurologist to stop this. He complains of feeling bad overall and no energy as well as depression as above. Testosterone level is 0      Medication List       This list is accurate as of: 12/11/13  8:04 AM.  Always use your most recent med list.               ALPRAZolam 0.5 MG tablet  Commonly known as:  XANAX  Take 1 tablet (0.5 mg total) by mouth 3 (three) times daily as needed for anxiety or sleep.     aspirin 325 MG tablet  Take 325 mg by mouth daily at 10 pm.     atorvastatin 20 MG tablet  Commonly known as:  LIPITOR  Take 10 mg by mouth daily at 10 pm.     buPROPion 150 MG 12 hr tablet  Commonly known as:  WELLBUTRIN SR  Take 150 mg by mouth every morning.     clobetasol cream 0.05 %  Commonly known as:  TEMOVATE     cyclobenzaprine 10 MG tablet  Commonly known as:  FLEXERIL  TAKE 1 TABLET ONCE DAILY.     gabapentin 300 MG capsule  Commonly known as:  NEURONTIN  imipramine 10 MG tablet  Commonly known as:  TOFRANIL  Take 10 mg by mouth at bedtime.     levothyroxine 112 MCG tablet  Commonly known as:  SYNTHROID, LEVOTHROID  Take 112 mcg by mouth daily before breakfast.     Phentermine-Topiramate 7.5-46 MG Cp24  Commonly known as:  QSYMIA  Take 7.5 mg by mouth daily.     predniSONE 10 MG tablet  Commonly known as:  DELTASONE  Take 30 mg by mouth daily.     ROZEREM 8 MG tablet  Generic drug:  ramelteon  Take 8 mg by mouth at bedtime. As needed for sleep     tadalafil 5 MG tablet  Commonly known as:  CIALIS  Take 1 tablet (5 mg total) by mouth daily as needed for erectile dysfunction.     tamsulosin 0.4 MG Caps capsule  Commonly known as:  FLOMAX  Take one capsule by mouth daily     venlafaxine XR 37.5 MG 24 hr capsule  Commonly known as:  EFFEXOR-XR  Take 37.5 mg by mouth  daily with breakfast.        Review of Systems He has a prior history of mild hypertension; because of low normal blood pressure readings his medications have been stopped and blood pressure is still normal  He was started on Rapaflo for his prostatism was also given Myrbetriq by urologist  He is due to see another urologist this week. Also asking about difficulty with ejaculation      Objective:   Physical Exam BP 128/62  Pulse 87  Temp(Src) 98.1 F (36.7 C)  Resp 14  Ht 5' 8"  (1.727 m)  Wt 204 lb 9.6 oz (92.806 kg)  BMI 31.12 kg/m2  SpO2 95%  No puffiness of face or cushingoid features  Mildly depressed affect  Assessment:      Temporal arteritis/giant cell arteritis, biopsy-proven Good response to  prednisone with resolution of visual symptoms He is currently on 12.5 mg prednisone and Tolerating it well  Increased depression: This is probably related to his not getting his testosterone injections for 4 months. Also is somewhat anxious which may be partly related to his mild increase in thyroid levels  Borderline anemia. Hemoglobin relatively lower and this is probably multifactorial related to chronic disease, low testosterone level. Also has not had a stool Hemoccult recently and needs to have this done  Hypothyroidism: TSH is 0 and he will need to reduce the dose from the current level of 112  High normal creatinine. Not clear of the etiology. He will be getting followup evaluation from urologist       Plan:      Increase Effexor XR to 75 mg. If not better in a week he will need to followup with psychiatrist  Stool Hemoccult  Synthroid 88 mcg daily  He will start his testosterone injections from urologist this week. If more convenient he can come here  Recheck ESR in 1 month and consider reducing dose to 10 mg at that time  Continue exercise regimen  Elayne Snare

## 2013-12-11 NOTE — Patient Instructions (Addendum)
Increase Effexor to 2  in am daily  New Rx for Synthroid

## 2013-12-12 ENCOUNTER — Telehealth: Payer: Self-pay | Admitting: Endocrinology

## 2013-12-12 NOTE — Telephone Encounter (Signed)
Mr. Vanwingerden would like to speak with Suanne Marker   Thank You:)

## 2013-12-13 ENCOUNTER — Other Ambulatory Visit: Payer: Self-pay | Admitting: *Deleted

## 2013-12-13 MED ORDER — ALPRAZOLAM 0.5 MG PO TABS: 0.5000 mg | ORAL_TABLET | Freq: Three times a day (TID) | ORAL | Status: DC | PRN

## 2013-12-13 NOTE — Telephone Encounter (Signed)
Pt would like clarification on the rx for his xanax he thinks he was supposed to get more than 30 pills for the month please snd to [place]

## 2013-12-15 DIAGNOSIS — N139 Obstructive and reflux uropathy, unspecified: Secondary | ICD-10-CM | POA: Diagnosis not present

## 2013-12-15 DIAGNOSIS — R972 Elevated prostate specific antigen [PSA]: Secondary | ICD-10-CM | POA: Diagnosis not present

## 2013-12-15 DIAGNOSIS — N529 Male erectile dysfunction, unspecified: Secondary | ICD-10-CM | POA: Diagnosis not present

## 2013-12-15 DIAGNOSIS — E291 Testicular hypofunction: Secondary | ICD-10-CM | POA: Diagnosis not present

## 2013-12-15 DIAGNOSIS — N138 Other obstructive and reflux uropathy: Secondary | ICD-10-CM | POA: Diagnosis not present

## 2013-12-15 DIAGNOSIS — R82998 Other abnormal findings in urine: Secondary | ICD-10-CM | POA: Diagnosis not present

## 2013-12-15 DIAGNOSIS — N401 Enlarged prostate with lower urinary tract symptoms: Secondary | ICD-10-CM | POA: Diagnosis not present

## 2013-12-28 DIAGNOSIS — E291 Testicular hypofunction: Secondary | ICD-10-CM | POA: Diagnosis not present

## 2014-01-01 ENCOUNTER — Other Ambulatory Visit: Payer: Self-pay | Admitting: *Deleted

## 2014-01-01 ENCOUNTER — Other Ambulatory Visit (INDEPENDENT_AMBULATORY_CARE_PROVIDER_SITE_OTHER): Payer: Medicare Other

## 2014-01-01 DIAGNOSIS — Z1211 Encounter for screening for malignant neoplasm of colon: Secondary | ICD-10-CM

## 2014-01-02 LAB — FECAL OCCULT BLOOD, IMMUNOCHEMICAL: FECAL OCCULT BLD: NEGATIVE

## 2014-01-09 ENCOUNTER — Encounter: Payer: Medicare Other | Admitting: Endocrinology

## 2014-01-11 ENCOUNTER — Telehealth: Payer: Self-pay | Admitting: *Deleted

## 2014-01-11 NOTE — Telephone Encounter (Signed)
Rx for what?

## 2014-01-11 NOTE — Telephone Encounter (Signed)
Need a RX written [employer] 30 days

## 2014-01-16 ENCOUNTER — Telehealth: Payer: Self-pay | Admitting: *Deleted

## 2014-01-16 ENCOUNTER — Other Ambulatory Visit: Payer: Self-pay | Admitting: *Deleted

## 2014-01-16 MED ORDER — TADALAFIL 5 MG PO TABS: 5.0000 mg | ORAL_TABLET | Freq: Every day | ORAL | Status: DC | PRN

## 2014-01-16 NOTE — Telephone Encounter (Signed)
Cialis 5 mg, 1 daily, cannot prescribe a larger dose for daily use

## 2014-01-16 NOTE — Telephone Encounter (Signed)
Patient called, he says he can get 30 tablets of Cialis for free, he just wants to know if you will send in an rx for it?

## 2014-01-16 NOTE — Telephone Encounter (Signed)
rx sent, patient is aware 

## 2014-01-30 DIAGNOSIS — E291 Testicular hypofunction: Secondary | ICD-10-CM | POA: Diagnosis not present

## 2014-02-07 ENCOUNTER — Ambulatory Visit: Payer: Medicare Other | Admitting: Endocrinology

## 2014-02-08 ENCOUNTER — Other Ambulatory Visit (INDEPENDENT_AMBULATORY_CARE_PROVIDER_SITE_OTHER): Payer: Medicare Other

## 2014-02-08 DIAGNOSIS — E7800 Pure hypercholesterolemia, unspecified: Secondary | ICD-10-CM | POA: Diagnosis not present

## 2014-02-08 DIAGNOSIS — M316 Other giant cell arteritis: Secondary | ICD-10-CM

## 2014-02-08 DIAGNOSIS — E039 Hypothyroidism, unspecified: Secondary | ICD-10-CM

## 2014-02-08 LAB — CBC
HCT: 43.8 % (ref 39.0–52.0)
Hemoglobin: 14.6 g/dL (ref 13.0–17.0)
MCHC: 33.3 g/dL (ref 30.0–36.0)
MCV: 95.2 fl (ref 78.0–100.0)
Platelets: 265 10*3/uL (ref 150.0–400.0)
RBC: 4.6 Mil/uL (ref 4.22–5.81)
RDW: 14.9 % (ref 11.5–15.5)
WBC: 9.2 10*3/uL (ref 4.0–10.5)

## 2014-02-08 LAB — URINALYSIS, ROUTINE W REFLEX MICROSCOPIC
Bilirubin Urine: NEGATIVE
Ketones, ur: NEGATIVE
Nitrite: NEGATIVE
Specific Gravity, Urine: 1.01 (ref 1.000–1.030)
Total Protein, Urine: NEGATIVE
URINE GLUCOSE: NEGATIVE
Urobilinogen, UA: 0.2 (ref 0.0–1.0)
pH: 6 (ref 5.0–8.0)

## 2014-02-08 LAB — BASIC METABOLIC PANEL
BUN: 18 mg/dL (ref 6–23)
CO2: 28 mEq/L (ref 19–32)
Calcium: 9.2 mg/dL (ref 8.4–10.5)
Chloride: 102 mEq/L (ref 96–112)
Creatinine, Ser: 1.5 mg/dL (ref 0.4–1.5)
GFR: 49.89 mL/min — ABNORMAL LOW (ref >60.00)
Glucose, Bld: 103 mg/dL — ABNORMAL HIGH (ref 70–99)
POTASSIUM: 4.1 meq/L (ref 3.5–5.1)
SODIUM: 139 meq/L (ref 135–145)

## 2014-02-08 LAB — LIPID PANEL
Cholesterol: 186 mg/dL (ref 0–200)
HDL: 78.5 mg/dL (ref >39.00)
LDL Cholesterol: 93 mg/dL (ref 0–99)
NonHDL: 107.5
TRIGLYCERIDES: 72 mg/dL (ref 0.0–149.0)
Total CHOL/HDL Ratio: 2
VLDL: 14.4 mg/dL (ref 0.0–40.0)

## 2014-02-08 LAB — SEDIMENTATION RATE: SED RATE: 7 mm/h (ref 0–22)

## 2014-02-08 LAB — TSH: TSH: 1.37 u[IU]/mL (ref 0.35–4.50)

## 2014-02-14 ENCOUNTER — Encounter: Payer: Self-pay | Admitting: Endocrinology

## 2014-02-14 ENCOUNTER — Ambulatory Visit (INDEPENDENT_AMBULATORY_CARE_PROVIDER_SITE_OTHER): Payer: Medicare Other | Admitting: Endocrinology

## 2014-02-14 ENCOUNTER — Encounter: Payer: Self-pay | Admitting: *Deleted

## 2014-02-14 VITALS — BP 138/72 | HR 87 | Temp 98.0°F | Resp 16 | Ht 68.0 in | Wt 203.8 lb

## 2014-02-14 DIAGNOSIS — M316 Other giant cell arteritis: Secondary | ICD-10-CM | POA: Diagnosis not present

## 2014-02-14 DIAGNOSIS — E039 Hypothyroidism, unspecified: Secondary | ICD-10-CM

## 2014-02-14 DIAGNOSIS — Z79899 Other long term (current) drug therapy: Secondary | ICD-10-CM | POA: Diagnosis not present

## 2014-02-14 DIAGNOSIS — Z23 Encounter for immunization: Secondary | ICD-10-CM

## 2014-02-14 DIAGNOSIS — F32A Depression, unspecified: Secondary | ICD-10-CM

## 2014-02-14 DIAGNOSIS — F329 Major depressive disorder, single episode, unspecified: Secondary | ICD-10-CM

## 2014-02-14 DIAGNOSIS — F3289 Other specified depressive episodes: Secondary | ICD-10-CM | POA: Diagnosis not present

## 2014-02-14 DIAGNOSIS — E7800 Pure hypercholesterolemia, unspecified: Secondary | ICD-10-CM

## 2014-02-14 DIAGNOSIS — Z0000 Encounter for general adult medical examination without abnormal findings: Secondary | ICD-10-CM | POA: Diagnosis not present

## 2014-02-14 MED ORDER — VENLAFAXINE HCL ER 75 MG PO CP24: 75.0000 mg | ORAL_CAPSULE | Freq: Every day | ORAL | Status: DC

## 2014-02-14 NOTE — Patient Instructions (Addendum)
Prednisone 10mg  alternating with 1/2 tab  Effexor 2 of the 37.5 mg daily and then switch to 75mg  daily  See Dr Caryl Asp for depression  May take alprazolam as needed for anxiety but minimize use  Walking 5 days a week, 30 min  Low saturated fat diet

## 2014-02-14 NOTE — Progress Notes (Signed)
Patient ID: Benjamin Hahn, male   DOB: 1933-01-25, 80 y.o.   MRN: [id number]  Subjective:    Patient is being seen today for Medicare annual wellness visit and review of chronic problems.    Risk factors: Advancing age, long-standing history of depression, obesity, recent use of steroids     Roster of Physicians Providing Medical Care to Patient:  See "care team"  Activities of Daily Living:  In the present state of health, the patient has no difficulty performing the following activities:  Preparing food and eating , Bathing, Getting dressed,  Using the toilet:  [place], he is able to walk up to 30 minutes  In the past year the patient has not fallen or had a near fall    Safety: Has smoke detector and wears seat belts.   No excess sun exposure.  Diet and Exercise  Current exercise habits: Goes to [facility] rarely, walking 30 min about 2 days a week Dietary issues discussed: heart healthy diet and less sweets, currently is not watching his diet at all and is consuming of sweets because of increased appetite from prednisone  Depression Screen:  See separate section Time spent on depression screening and discussion on diagnosis and management = 15 minutes  The following portions of the patient's history were reviewed and updated as appropriate:  allergies, current medications, past family history, past medical history, past social history, past surgical history and problem list.   Review of Systems  Denies hearing loss, and visual change Sees Dr Gershon Crane periodically for eye exams He is concerned about weight loss and is asking for diet pills although has not gained any weight in the last 2 months  Wt Readings from Last 3 Encounters:  02/14/14 203 lb 12.8 oz (92.443 kg)  12/11/13 204 lb 9.6 oz (92.806 kg)  10/03/13 201 lb 9.6 oz (91.445 kg)   He has long-standing hypercholesterolemia and hypothyroidism adequately controlled currently with appropriate medications which he is  compliant with  Testosterone deficiency: Currently managed by urologist with testosterone pellets, followup testosterone level pending, recent office note from urologist reviewed  Temporal arteritis: Currently doing well without headaches or visual loss. Taking 12.5 mg prednisone and ESR is improved at 7   LABS:  Appointment on 02/08/2014  Component Date Value Ref Range Status  . Color, Urine 02/08/2014 YELLOW  Yellow;Lt. Yellow Final  . APPearance 02/08/2014 Cloudy* Clear Final  . Specific Gravity, Urine 02/08/2014 1.010  1.000-1.030 Final  . pH 02/08/2014 6.0  5.0 - 8.0 Final  . Total Protein, Urine 02/08/2014 NEGATIVE  Negative Final  . Urine Glucose 02/08/2014 NEGATIVE  Negative Final  . Ketones, ur 02/08/2014 NEGATIVE  Negative Final  . Bilirubin Urine 02/08/2014 NEGATIVE  Negative Final  . Hgb urine dipstick 02/08/2014 TRACE-INTACT* Negative Final  . Urobilinogen, UA 02/08/2014 0.2  0.0 - 1.0 Final  . Leukocytes, UA 02/08/2014 MODERATE* Negative Final  . Nitrite 02/08/2014 NEGATIVE  Negative Final  . WBC, UA 02/08/2014 TNTC(>50/hpf)* 0-2/hpf Final  . RBC / HPF 02/08/2014 0-2/hpf  0-2/hpf Final  . Bacteria, UA 02/08/2014 Few(10-50/hpf)* None Final  . WBC 02/08/2014 9.2  4.0 - 10.5 K/uL Final  . RBC 02/08/2014 4.60  4.22 - 5.81 Mil/uL Final  . Platelets 02/08/2014 265.0  150.0 - 400.0 K/uL Final  . Hemoglobin 02/08/2014 14.6  13.0 - 17.0 g/dL Final  . HCT 02/08/2014 43.8  39.0 - 52.0 % Final  . MCV 02/08/2014 95.2  78.0 - 100.0 fl Final  . MCHC  02/08/2014 33.3  30.0 - 36.0 g/dL Final  . RDW 02/08/2014 14.9  11.5 - 15.5 % Final  . Sodium 02/08/2014 139  135 - 145 mEq/L Final  . Potassium 02/08/2014 4.1  3.5 - 5.1 mEq/L Final  . Chloride 02/08/2014 102  96 - 112 mEq/L Final  . CO2 02/08/2014 28  19 - 32 mEq/L Final  . Glucose, Bld 02/08/2014 103* 70 - 99 mg/dL Final  . BUN 02/08/2014 18  6 - 23 mg/dL Final  . Creatinine, Ser 02/08/2014 1.5  0.4 - 1.5 mg/dL Final  . Calcium  02/08/2014 9.2  8.4 - 10.5 mg/dL Final  . GFR 02/08/2014 49.89* >60.00 mL/min Final  . Sed Rate 02/08/2014 7  0 - 22 mm/hr Final  . TSH 02/08/2014 1.37  0.35 - 4.50 uIU/mL Final  . Cholesterol 02/08/2014 186  0 - 200 mg/dL Final   ATP III Classification       Desirable:  < 200 mg/dL               Borderline High:  200 - 239 mg/dL          High:  > = 240 mg/dL  . Triglycerides 02/08/2014 72.0  0.0 - 149.0 mg/dL Final   Normal:  <150 mg/dLBorderline High:  150 - 199 mg/dL  . HDL 02/08/2014 78.50  >39.00 mg/dL Final  . VLDL 02/08/2014 14.4  0.0 - 40.0 mg/dL Final  . LDL Cholesterol 02/08/2014 93  0 - 99 mg/dL Final  . Total CHOL/HDL Ratio 02/08/2014 2   Final                  Men          Women1/2 Average Risk     3.4          3.3Average Risk          5.0          4.42X Average Risk          9.6          7.13X Average Risk          15.0          11.0                      . NonHDL 02/08/2014 107.50   Final    Objective:    BP 138/72  Pulse 87  Temp(Src) 98 F (36.7 C)  Resp 16  Ht 5' 8"  (1.727 m)  Wt 203 lb 12.8 oz (92.443 kg)  BMI 30.99 kg/m2  SpO2 96%  Vision:  Normal, has annual eye exams   Hearing: grossly normal Body mass index:  See vitals Msk: pt easily and quickly performs "get-up-and-go" from a sitting position Cognitive Impairment Assessment: cognition, memory and judgment appear normal.  remembers 3/3 at 5 minutes. Has excellent recall.  can easily read and write a sentence.  alert and oriented x 3   Assessment:   Medicare wellness evaluation done   Preventive parameters reviewed  Diabetes screening: Fasting glucose 103, probably related to prednisone use  Plan:   During the course of the visit the patient was educated and counseled about appropriate screening and preventive services including:       Fall prevention   Nutrition counseling done today  Lipid screening completed Colorectal screening, done in 5/15 Regular eye exams Depression management: He will  need followup with psychiatrist because of increased symptoms medication changes as  below Encouraged restricting of alcohol intake and avoid using this for treating depression, also emphasized the need for reducing caloric intake Vaccines / LABS Zostavax in 2011 / Pneumococcal Vaccine in 2009: up-to-date  Prevnar has not been given and is going to be administered today, discussed benefits  Medication adjustment:  Increase Effexor to 75 mg daily  Prednisone 10 mg alternating with 5 mg  Followup in 4 weeks with repeat labs Patient Instructions (the written plan) was given to the patient.   [facility] 02/14/2014

## 2014-03-13 ENCOUNTER — Ambulatory Visit: Payer: Medicare Other | Admitting: Endocrinology

## 2014-03-16 ENCOUNTER — Encounter: Payer: Self-pay | Admitting: Endocrinology

## 2014-03-16 ENCOUNTER — Ambulatory Visit (INDEPENDENT_AMBULATORY_CARE_PROVIDER_SITE_OTHER): Payer: Medicare Other | Admitting: Endocrinology

## 2014-03-16 VITALS — BP 120/68 | HR 93 | Temp 98.5°F | Resp 16 | Ht 68.0 in | Wt 207.4 lb

## 2014-03-16 DIAGNOSIS — M316 Other giant cell arteritis: Secondary | ICD-10-CM

## 2014-03-16 DIAGNOSIS — E7800 Pure hypercholesterolemia, unspecified: Secondary | ICD-10-CM | POA: Diagnosis not present

## 2014-03-16 DIAGNOSIS — F3289 Other specified depressive episodes: Secondary | ICD-10-CM | POA: Diagnosis not present

## 2014-03-16 DIAGNOSIS — E291 Testicular hypofunction: Secondary | ICD-10-CM | POA: Diagnosis not present

## 2014-03-16 DIAGNOSIS — E039 Hypothyroidism, unspecified: Secondary | ICD-10-CM

## 2014-03-16 DIAGNOSIS — F329 Major depressive disorder, single episode, unspecified: Secondary | ICD-10-CM | POA: Diagnosis not present

## 2014-03-16 LAB — BASIC METABOLIC PANEL
BUN: 26 mg/dL — ABNORMAL HIGH (ref 6–23)
CO2: 31 mEq/L (ref 19–32)
Calcium: 8.8 mg/dL (ref 8.4–10.5)
Chloride: 103 mEq/L (ref 96–112)
Creatinine, Ser: 1.3 mg/dL (ref 0.4–1.5)
GFR: 54.64 mL/min — AB (ref >60.00)
GLUCOSE: 126 mg/dL — AB (ref 70–99)
POTASSIUM: 4.1 meq/L (ref 3.5–5.1)
Sodium: 139 mEq/L (ref 135–145)

## 2014-03-16 LAB — TESTOSTERONE: TESTOSTERONE: 433.39 ng/dL (ref 300.00–890.00)

## 2014-03-16 LAB — SEDIMENTATION RATE: Sed Rate: 5 mm/hr (ref 0–22)

## 2014-03-16 MED ORDER — VENLAFAXINE HCL ER 37.5 MG PO CP24: 37.5000 mg | ORAL_CAPSULE | Freq: Every day | ORAL | Status: DC

## 2014-03-16 NOTE — Progress Notes (Signed)
Subjective:      Patient ID: Benjamin Hahn, male   DOB: 03/05/1933, 80 y.o.   MRN: [id number]  Chief complaint: Followup  HPI   He had been complaining of increased depression on his last visit in 6/15. Also was having insomnia. His Effexor was increased to 75 from 37.5 mg and he was asked to see the psychiatrist. He thinks his mood is better and he is sleeping better also. However still appears to have some depression and decreased motivation. Today he is mostly talking about his weight gain. Has been able to sleep well with his usual regimen of Flexeril, imipramine and Rozerem  Hypothyroidism: This is long-standing and he is usually on a stable dose. However his TSH was low in 4/14 and the dose was reduced to 88 mcg. Last lab work was as follows:   Lab Results  Component Value Date   FREET4 1.11 12/01/2013   FREET4 0.96 09/28/2013   FREET4 1.02 05/24/2013   TSH 1.37 02/08/2014   TSH 0.03* 12/01/2013   TSH 0.46 09/28/2013     Temporal arteritis:  Background history: In early October he was having episodes of visual loss, starting in the left eye first and then subsequently bilateral He was also having a mild headache for about 10 days Because of his above symptoms and high ESR of 55 he was started on 60 mg prednisone Also had a temporal artery biopsy which was positive for temporal arteritis RECENT HISTORY: Since starting prednisone he has not had any more episodes of amaurosis or headaches. The dose has been gradually reduced from initial dosage of 60 mg  With his ESR continuing to be normal with the steroid taper he is now taking 10 mg alternating and 5 mg in 6/15 ESR is pending from today Currently tolerating prednisone without side effects except weight gain and possibly depression  Lab Results  Component Value Date   ESRSEDRATE 7 02/08/2014    Weight gain:  He is not able to afford  Qsymia and is very concerned about his weight gain although has gained only 3 pounds He  has been walking only a couple of times a week, previously was doing better. Asking for a low calorie diet  Wt Readings from Last 3 Encounters:  03/16/14 207 lb 6.4 oz (94.076 kg)  02/14/14 203 lb 12.8 oz (92.443 kg)  12/11/13 204 lb 9.6 oz (92.806 kg)    Long-standing hypogonadism: He has been treated by his urologist. He had been taking testosterone injections for about 2 years and was doing fairly well with this. With seeing a new urologist he has been treated with a Testopel pellet in 5/15 and not clear if he has had a followup testosterone level      Medication List       This list is accurate as of: 03/16/14  8:27 AM.  Always use your most recent med list.               ALPRAZolam 0.5 MG tablet  Commonly known as:  XANAX  Take 1 tablet (0.5 mg total) by mouth 3 (three) times daily as needed for anxiety or sleep.     aspirin 325 MG tablet  Take 325 mg by mouth daily at 10 pm.     atorvastatin 20 MG tablet  Commonly known as:  LIPITOR  Take 10 mg by mouth daily at 10 pm.     buPROPion 150 MG 12 hr tablet  Commonly known as:  [employer]  SR  Take 150 mg by mouth every morning.     clobetasol cream 0.05 %  Commonly known as:  TEMOVATE     cyclobenzaprine 10 MG tablet  Commonly known as:  FLEXERIL  TAKE 1 TABLET ONCE DAILY.     imipramine 10 MG tablet  Commonly known as:  TOFRANIL  Take 10 mg by mouth at bedtime.     levothyroxine 88 MCG tablet  Commonly known as:  SYNTHROID, LEVOTHROID  Take 1 tablet (88 mcg total) by mouth daily.     predniSONE 10 MG tablet  Commonly known as:  DELTASONE  Take 12.5 mg by mouth daily.     ROZEREM 8 MG tablet  Generic drug:  ramelteon  Take 8 mg by mouth at bedtime. As needed for sleep     tadalafil 5 MG tablet  Commonly known as:  CIALIS  Take 1 tablet (5 mg total) by mouth daily as needed for erectile dysfunction.     tamsulosin 0.4 MG Caps capsule  Commonly known as:  FLOMAX  Take one capsule by mouth daily      venlafaxine XR 75 MG 24 hr capsule  Commonly known as:  EFFEXOR XR  Take 1 capsule (75 mg total) by mouth daily with breakfast.        Review of Systems He has a prior history of mild hypertension; because of low normal blood pressure readings his medications were stopped and blood pressure is still normal  He  is now taking Flomax  Hypercholesterolemia: This has been long-standing and he has been well controlled with Lipitor 20 mg, half tablet daily and has had recent labs done.  Lab Results  Component Value Date   CHOL 186 02/08/2014   HDL 78.50 02/08/2014   LDLCALC 93 02/08/2014   TRIG 72.0 02/08/2014   CHOLHDL 2 02/08/2014    He had mild anemia which has resolved     Objective:   Physical Exam BP 148/78  Pulse 93  Temp(Src) 98.5 F (36.9 C)  Resp 16  Ht _0  (1.727 m)  Wt 207 lb 6.4 oz (94.076 kg)  BMI 31.54 kg/m2  SpO2 94%  Repeat BP 120/68  No puffiness of face or cushingoid features  Has a mildly depressed affect but is not anxious.   No ankle edema  Assessment:      Temporal arteritis/giant cell arteritis, biopsy-proven Good response to  prednisone with resolution of visual symptoms He is currently on 7.5 mg prednisone and had no significant side effects except mild weight gain.  Increased depression: This is somewhat better with increasing Effexor XR but not up to normal. He still appears to have some depressed mood and low motivation.  Depression may be partly worse from hypogonadism and steroid use   Severe hypogonadism, hypogonadotropic. Has not had a level done since he had his injection of Testopel inserted  Hypothyroidism: Adequate levels on his last visit and he will need periodic followup  Weight gain, he is concerned about this and needs better management of exercise regimen and diet. May improve with reducing prednisone  Blood pressure is probably high today because of anxiety and was normal on repeat measurement  Mild general malaise may  be related to his depression    Plan:      Will add another 37.5 mg  of Effexor XR but asked him to see his new psychiatrist .  Given him general information on caloric reduction and balanced healthy meals  His testosterone level  will be assessed today and will forward it to urologist  Continue to monitor blood pressure  Recheck ESR today and consider reducing dose of prednisone to 5 mg  Increase exercise regimen to walking at least 4-5 days a week  Stool Hemoccult needs to be discussed on his next visit as he did not bring his specimen back   [facility]

## 2014-03-16 NOTE — Patient Instructions (Addendum)
Walk daily. Follow diet given today Prednisone dose to be decided Add additional 37.5 mg Effexor. Make appointment with psychiatrist

## 2014-03-28 ENCOUNTER — Other Ambulatory Visit: Payer: Self-pay | Admitting: Endocrinology

## 2014-04-11 ENCOUNTER — Encounter: Payer: Self-pay | Admitting: Endocrinology

## 2014-04-11 ENCOUNTER — Other Ambulatory Visit: Payer: Self-pay | Admitting: Endocrinology

## 2014-04-17 ENCOUNTER — Other Ambulatory Visit: Payer: Self-pay | Admitting: Endocrinology

## 2014-04-19 ENCOUNTER — Other Ambulatory Visit: Payer: Self-pay | Admitting: Endocrinology

## 2014-04-19 ENCOUNTER — Telehealth: Payer: Self-pay | Admitting: Endocrinology

## 2014-04-19 MED ORDER — ALBUTEROL SULFATE HFA 108 (90 BASE) MCG/ACT IN AERS: 2.0000 | INHALATION_SPRAY | Freq: Four times a day (QID) | RESPIRATORY_TRACT | Status: DC | PRN

## 2014-04-19 NOTE — Telephone Encounter (Signed)
Patient has a bad cough and would like to know if Dr Dwyane Dee would send him something to the pharmacy.

## 2014-04-19 NOTE — Telephone Encounter (Signed)
He is having a dry cough with a little wheezing Take 20 mg prednisone for 2 days then 15 mg for another 2 days. Use albuterol inhaler as needed

## 2014-04-19 NOTE — Telephone Encounter (Signed)
Please see below and advise.

## 2014-04-20 ENCOUNTER — Other Ambulatory Visit: Payer: Self-pay | Admitting: Endocrinology

## 2014-04-24 ENCOUNTER — Other Ambulatory Visit: Payer: Medicare Other

## 2014-04-24 ENCOUNTER — Other Ambulatory Visit (INDEPENDENT_AMBULATORY_CARE_PROVIDER_SITE_OTHER): Payer: Medicare Other

## 2014-04-24 DIAGNOSIS — E7800 Pure hypercholesterolemia, unspecified: Secondary | ICD-10-CM

## 2014-04-24 DIAGNOSIS — M316 Other giant cell arteritis: Secondary | ICD-10-CM | POA: Diagnosis not present

## 2014-04-24 DIAGNOSIS — E291 Testicular hypofunction: Secondary | ICD-10-CM | POA: Diagnosis not present

## 2014-04-24 DIAGNOSIS — E039 Hypothyroidism, unspecified: Secondary | ICD-10-CM

## 2014-04-24 LAB — COMPREHENSIVE METABOLIC PANEL
ALT: 14 U/L (ref 0–53)
AST: 13 U/L (ref 0–37)
Albumin: 3.4 g/dL — ABNORMAL LOW (ref 3.5–5.2)
Alkaline Phosphatase: 52 U/L (ref 39–117)
BUN: 20 mg/dL (ref 6–23)
CO2: 30 mEq/L (ref 19–32)
Calcium: 9 mg/dL (ref 8.4–10.5)
Chloride: 102 mEq/L (ref 96–112)
Creatinine, Ser: 1.4 mg/dL (ref 0.4–1.5)
GFR: 51.93 mL/min — ABNORMAL LOW (ref >60.00)
Glucose, Bld: 91 mg/dL (ref 70–99)
Potassium: 4.4 mEq/L (ref 3.5–5.1)
SODIUM: 140 meq/L (ref 135–145)
TOTAL PROTEIN: 6.9 g/dL (ref 6.0–8.3)
Total Bilirubin: 0.6 mg/dL (ref 0.2–1.2)

## 2014-04-24 LAB — TSH: TSH: 1.67 u[IU]/mL (ref 0.35–4.50)

## 2014-04-24 LAB — T4, FREE: Free T4: 0.83 ng/dL (ref 0.60–1.60)

## 2014-04-24 LAB — SEDIMENTATION RATE: Sed Rate: 35 mm/hr — ABNORMAL HIGH (ref 0–22)

## 2014-04-27 ENCOUNTER — Telehealth: Payer: Self-pay | Admitting: Endocrinology

## 2014-04-27 ENCOUNTER — Encounter: Payer: Self-pay | Admitting: Endocrinology

## 2014-04-27 ENCOUNTER — Ambulatory Visit (INDEPENDENT_AMBULATORY_CARE_PROVIDER_SITE_OTHER): Payer: Medicare Other | Admitting: Endocrinology

## 2014-04-27 ENCOUNTER — Other Ambulatory Visit: Payer: Self-pay | Admitting: *Deleted

## 2014-04-27 VITALS — BP 146/78 | HR 85 | Temp 98.3°F | Resp 16 | Ht 68.0 in | Wt 199.6 lb

## 2014-04-27 DIAGNOSIS — F329 Major depressive disorder, single episode, unspecified: Secondary | ICD-10-CM

## 2014-04-27 DIAGNOSIS — N529 Male erectile dysfunction, unspecified: Secondary | ICD-10-CM

## 2014-04-27 DIAGNOSIS — E039 Hypothyroidism, unspecified: Secondary | ICD-10-CM

## 2014-04-27 DIAGNOSIS — M316 Other giant cell arteritis: Secondary | ICD-10-CM

## 2014-04-27 DIAGNOSIS — F3289 Other specified depressive episodes: Secondary | ICD-10-CM

## 2014-04-27 DIAGNOSIS — N528 Other male erectile dysfunction: Secondary | ICD-10-CM

## 2014-04-27 DIAGNOSIS — F32A Depression, unspecified: Secondary | ICD-10-CM

## 2014-04-27 MED ORDER — PREDNISONE 10 MG PO TABS: 10.0000 mg | ORAL_TABLET | Freq: Every day | ORAL | Status: DC

## 2014-04-27 MED ORDER — SILDENAFIL CITRATE 20 MG PO TABS: 20.0000 mg | ORAL_TABLET | ORAL | Status: DC | PRN

## 2014-04-27 MED ORDER — TADALAFIL 5 MG PO TABS: 5.0000 mg | ORAL_TABLET | Freq: Every day | ORAL | Status: DC | PRN

## 2014-04-27 NOTE — Telephone Encounter (Signed)
Pharmacy will need PA for pt's sildenafil

## 2014-04-27 NOTE — Progress Notes (Signed)
Subjective:      Patient ID: Benjamin Hahn, male   DOB: [birth year], 80 y.o.   MRN: [id number]  Chief complaint: Followup  HPI   He had been complaining of increased depression in 6/15. Also was having insomnia. His Effexor was increased to 75 from 37.5 mg and he was asked to see the psychiatrist which she did not. However his mood is better and he is sleeping better also. He took 2 tablets of the 75 mg by mistake once and this caused him to be drowsy.  Has been able to sleep well with his usual regimen of Flexeril, imipramine and Rozerem  Hypothyroidism: This is long-standing and he is usually on a stable dose. Previously his TSH was low in 4/14 and the dose was reduced to 88 mcg. Last lab work was as follows:   Lab Results  Component Value Date   FREET4 0.83 04/24/2014   FREET4 1.11 12/01/2013   FREET4 0.96 09/28/2013   TSH 1.67 04/24/2014   TSH 1.37 02/08/2014   TSH 0.03* 12/01/2013     Temporal arteritis:  Background history: In early October he was having episodes of visual loss, starting in the left eye first and then subsequently bilateral He was also having a mild headache for about 10 days Because of his above symptoms and high ESR of 55 he was started on 60 mg prednisone Also had a temporal artery biopsy which was positive for temporal arteritis RECENT HISTORY: Since starting prednisone he has not had any more episodes of amaurosis or headaches. The dose has been gradually reduced from initial dosage of 60 mg  With his ESR continuing to be normal with the steroid taper he is now taking 5 mg daily since his last test but ESR has gone up to 35 He does not have any recurrence of his headaches or visual symptoms. Also the week before his lab was drawn he had an upper respiratory infection with cough and was given extra prednisone for 3 days   Lab Results  Component Value Date   ESRSEDRATE 35* 04/24/2014    Weight gain:  He is  probably watching his diet better and trying  to walk more as his weight is improving. Also depression is better   Wt Readings from Last 3 Encounters:  04/27/14 199 lb 9.6 oz (90.538 kg)  03/16/14 207 lb 6.4 oz (94.076 kg)  02/14/14 203 lb 12.8 oz (92.443 kg)   Long-standing hypogonadism: He has been treated by his urologist. He had been taking testosterone injections for about 2 years and was doing fairly well with this. With seeing a new urologist he has been treated with a Testopel pellet in 5/15 and last testosterone level was excellent  Lab Results  Component Value Date   TESTOSTERONE 433.39 03/16/2014    ERECTILE DYSFUNCTION: He is asking for treatment. He cannot afford the daily Cialis. He is asking about less expensive alternatives      Medication List       This list is accurate as of: 04/27/14  8:20 AM.  Always use your most recent med list.               albuterol 108 (90 BASE) MCG/ACT inhaler  Commonly known as:  PROVENTIL HFA;VENTOLIN HFA  Inhale 2 puffs into the lungs every 6 (six) hours as needed for wheezing or shortness of breath.     ALPRAZolam 0.5 MG tablet  Commonly known as:  XANAX  Take 1 tablet (0.5 mg  total) by mouth 3 (three) times daily as needed for anxiety or sleep.     aspirin 325 MG tablet  Take 325 mg by mouth daily at 10 pm.     atorvastatin 20 MG tablet  Commonly known as:  LIPITOR  TAKE (1/2) TABLET DAILY.     buPROPion 150 MG 12 hr tablet  Commonly known as:  [employer] SR  Take 150 mg by mouth every morning.     clobetasol cream 0.05 %  Commonly known as:  TEMOVATE     cyclobenzaprine 10 MG tablet  Commonly known as:  FLEXERIL  TAKE 1 TABLET ONCE DAILY.     imipramine 10 MG tablet  Commonly known as:  TOFRANIL  Take 10 mg by mouth at bedtime.     levothyroxine 88 MCG tablet  Commonly known as:  SYNTHROID, LEVOTHROID  TAKE 1 TABLET ONCE DAILY.     predniSONE 10 MG tablet  Commonly known as:  DELTASONE  Take 12.5 mg by mouth daily.     ROZEREM 8 MG tablet  Generic  drug:  ramelteon  TAKE ONE TABLET AT BEDTIME.     tadalafil 5 MG tablet  Commonly known as:  CIALIS  Take 1 tablet (5 mg total) by mouth daily as needed for erectile dysfunction.     tamsulosin 0.4 MG Caps capsule  Commonly known as:  FLOMAX  TAKE (1) CAPSULE DAILY.     venlafaxine XR 75 MG 24 hr capsule  Commonly known as:  EFFEXOR XR  Take 1 capsule (75 mg total) by mouth daily with breakfast.     venlafaxine XR 37.5 MG 24 hr capsule  Commonly known as:  EFFEXOR XR  Take 1 capsule (37.5 mg total) by mouth daily with breakfast.        Review of Systems He has a prior history of mild hypertension; because of low normal blood pressure readings his medications were stopped and blood pressure is still normal  He  is now taking Flomax  Hypercholesterolemia: This has been long-standing and he has been well controlled with Lipitor 20 mg, half tablet daily and has had recent labs done.  Lab Results  Component Value Date   CHOL 186 02/08/2014   HDL 78.50 02/08/2014   LDLCALC 93 02/08/2014   TRIG 72.0 02/08/2014   CHOLHDL 2 02/08/2014    He had mild anemia which has resolved  He had dry cough with significant symptoms and was given prednisone in higher doses to help with symptoms. He did not use the albuterol that was given in symptoms are clear now     Objective:   Physical Exam BP 146/78  Pulse 85  Temp(Src) 98.3 F (36.8 C)  Resp 16  Ht _0  (1.727 m)  Wt 199 lb 9.6 oz (90.538 kg)  BMI 30.36 kg/m2  SpO2 95%  Repeat BP 128/72  No puffiness of face or cushingoid features   Assessment:      Temporal arteritis/giant cell arteritis, biopsy-proven Good response to  prednisone with resolution of visual symptoms. Not clear if his high ESR is related to recurrence of his inflammatory state or effect of recent upper respiratory infection. He is currently on  5 mg prednisone and will temporarily increase this to 10 mg alternating with 5 mg     Blood pressure high normal  but repeat testing normal, continue to observe without medications  Discussed various options for treatment of erectile dysfunction, he wants to use the least expensive product  Hypogonadism adequately treated with testosterone pellet. Followup with urologist for hypogonadism  Stable thyroid levels  Weight gain, improving    Plan:      Increase prednisone as above and recheck ESR in 3 weeks  Continue 75 mg of Effexor XR as his symptoms are controlled with this. He can take Xanax as needed for anxiety during the day  Increase exercise regimen to walking at least 4-5 days a week for maintaining weight loss  Stool Hemoccult needs to be discussed on his next visit    He can try generic sildenafil 20 mg tablets, 4-5 as needed, 30 tablets given  Counseling time over 50% of today's 25 minute visit  Kemia Wendel

## 2014-04-27 NOTE — Patient Instructions (Signed)
Prednisone 10mg , alternate 1 with 1/2 till next test   Venlafaxine 75 mg daily  Walk daily

## 2014-05-14 ENCOUNTER — Other Ambulatory Visit: Payer: Self-pay | Admitting: Endocrinology

## 2014-05-14 ENCOUNTER — Other Ambulatory Visit: Payer: Self-pay | Admitting: *Deleted

## 2014-05-14 MED ORDER — ALPRAZOLAM 0.5 MG PO TABS: 0.5000 mg | ORAL_TABLET | Freq: Three times a day (TID) | ORAL | Status: DC | PRN

## 2014-05-30 ENCOUNTER — Telehealth: Payer: Self-pay | Admitting: Endocrinology

## 2014-05-30 ENCOUNTER — Other Ambulatory Visit: Payer: Self-pay | Admitting: *Deleted

## 2014-05-30 DIAGNOSIS — M316 Other giant cell arteritis: Secondary | ICD-10-CM

## 2014-05-30 NOTE — Telephone Encounter (Signed)
Patient's eyes are bothering him still please advise on follow up.

## 2014-05-30 NOTE — Telephone Encounter (Signed)
If he did not have any loss of vision then it is unrelated to the temporal arteritis. I would like to him  come in the morning to do a sedimentation rate anyway

## 2014-05-30 NOTE — Telephone Encounter (Signed)
Labs ordered, patient will go tomorrow to have them done

## 2014-05-30 NOTE — Telephone Encounter (Signed)
Patient said on Sunday he was at a play/program and he started having double vision which scared him, he said it cleared up the next day and it hasn't happened since.  He said he feels like he's having eye strain, no burning, stinging, discharge. He's on prednisone 10,5,10,5. He does have an appointment on 11/2 to see Dr. Gershon Crane.  Please advise

## 2014-05-30 NOTE — Telephone Encounter (Signed)
Please see below and advise.

## 2014-05-30 NOTE — Telephone Encounter (Signed)
What symptoms?

## 2014-05-31 ENCOUNTER — Other Ambulatory Visit (INDEPENDENT_AMBULATORY_CARE_PROVIDER_SITE_OTHER): Payer: Medicare Other

## 2014-05-31 DIAGNOSIS — M316 Other giant cell arteritis: Secondary | ICD-10-CM

## 2014-05-31 LAB — SEDIMENTATION RATE: Sed Rate: 10 mm/hr (ref 0–22)

## 2014-05-31 NOTE — Progress Notes (Signed)
Quick Note:  Please let patient know that the lab result is normal and no further action needed, would suggest he followup with neurologist for the double vision ______

## 2014-06-06 ENCOUNTER — Other Ambulatory Visit: Payer: Self-pay | Admitting: Endocrinology

## 2014-06-07 DIAGNOSIS — E291 Testicular hypofunction: Secondary | ICD-10-CM | POA: Diagnosis not present

## 2014-06-07 DIAGNOSIS — R972 Elevated prostate specific antigen [PSA]: Secondary | ICD-10-CM | POA: Diagnosis not present

## 2014-06-13 DIAGNOSIS — R339 Retention of urine, unspecified: Secondary | ICD-10-CM | POA: Diagnosis not present

## 2014-06-13 DIAGNOSIS — N3941 Urge incontinence: Secondary | ICD-10-CM | POA: Diagnosis not present

## 2014-06-13 DIAGNOSIS — E291 Testicular hypofunction: Secondary | ICD-10-CM | POA: Diagnosis not present

## 2014-06-13 DIAGNOSIS — N401 Enlarged prostate with lower urinary tract symptoms: Secondary | ICD-10-CM | POA: Diagnosis not present

## 2014-06-27 ENCOUNTER — Ambulatory Visit: Payer: Medicare Other | Admitting: Endocrinology

## 2014-06-28 ENCOUNTER — Encounter: Payer: Self-pay | Admitting: Endocrinology

## 2014-06-28 ENCOUNTER — Ambulatory Visit (INDEPENDENT_AMBULATORY_CARE_PROVIDER_SITE_OTHER): Payer: Medicare Other | Admitting: Endocrinology

## 2014-06-28 VITALS — BP 136/82 | HR 85 | Temp 98.0°F | Resp 14 | Ht 69.0 in | Wt 201.6 lb

## 2014-06-28 DIAGNOSIS — E7800 Pure hypercholesterolemia, unspecified: Secondary | ICD-10-CM

## 2014-06-28 DIAGNOSIS — F329 Major depressive disorder, single episode, unspecified: Secondary | ICD-10-CM

## 2014-06-28 DIAGNOSIS — M316 Other giant cell arteritis: Secondary | ICD-10-CM | POA: Diagnosis not present

## 2014-06-28 DIAGNOSIS — E038 Other specified hypothyroidism: Secondary | ICD-10-CM | POA: Diagnosis not present

## 2014-06-28 DIAGNOSIS — E063 Autoimmune thyroiditis: Secondary | ICD-10-CM

## 2014-06-28 DIAGNOSIS — Z23 Encounter for immunization: Secondary | ICD-10-CM | POA: Diagnosis not present

## 2014-06-28 DIAGNOSIS — F32A Depression, unspecified: Secondary | ICD-10-CM

## 2014-06-28 LAB — CBC WITH DIFFERENTIAL/PLATELET
BASOS ABS: 0 10*3/uL (ref 0.0–0.1)
Basophils Relative: 0.5 % (ref 0.0–3.0)
Eosinophils Absolute: 0.1 10*3/uL (ref 0.0–0.7)
Eosinophils Relative: 1.2 % (ref 0.0–5.0)
HCT: 41.5 % (ref 39.0–52.0)
Hemoglobin: 13.8 g/dL (ref 13.0–17.0)
LYMPHS ABS: 1.4 10*3/uL (ref 0.7–4.0)
LYMPHS PCT: 18.1 % (ref 12.0–46.0)
MCHC: 33.1 g/dL (ref 30.0–36.0)
MCV: 91.7 fl (ref 78.0–100.0)
MONOS PCT: 9.6 % (ref 3.0–12.0)
Monocytes Absolute: 0.7 10*3/uL (ref 0.1–1.0)
NEUTROS PCT: 70.6 % (ref 43.0–77.0)
Neutro Abs: 5.5 10*3/uL (ref 1.4–7.7)
PLATELETS: 222 10*3/uL (ref 150.0–400.0)
RBC: 4.53 Mil/uL (ref 4.22–5.81)
RDW: 15.4 % (ref 11.5–15.5)
WBC: 7.8 10*3/uL (ref 4.0–10.5)

## 2014-06-28 LAB — SEDIMENTATION RATE: Sed Rate: 8 mm/hr (ref 0–22)

## 2014-06-28 NOTE — Patient Instructions (Signed)
Welbutrin with 75 Effexor

## 2014-06-28 NOTE — Progress Notes (Signed)
Subjective:      Patient ID: Benjamin Hahn, male   DOB: 12-23-1932, 80 y.o.   MRN: [id number]  Chief complaint: Followup  HPI   He had been complaining of increased depression in 6/15.   His Effexor was increased to 112.5 g from previous doses of 75 and 37.5 mg  .  Recently his mood is better and he is sleeping better. He does tend to have a little drowsiness from the higher dose of Effexor.  Asking about taking Wellbutrin to help his mood and relaxation.  He says he does not get any subjective feeling of well-being when he has a alcoholic drink.   Has been able to sleep well with his usual regimen of Flexeril, imipramine, melatonin and xanax  Hypothyroidism: This is long-standing and he is usually on a stable dose. Previously his TSH was low in 4/14 and the dose was reduced to 88 mcg. Last lab work was as follows:   Lab Results  Component Value Date   FREET4 0.83 04/24/2014   FREET4 1.11 12/01/2013   FREET4 0.96 09/28/2013   TSH 1.67 04/24/2014   TSH 1.37 02/08/2014   TSH 0.03* 12/01/2013     Temporal arteritis:  Background history: In early October he was having episodes of visual loss, starting in the left eye first and then subsequently bilateral He was also having a mild headache for about 10 days Because of his above symptoms and high ESR of 55 he was started on 60 mg prednisone Also had a temporal artery biopsy which was positive for temporal arteritis RECENT HISTORY: Since starting prednisone he has not had any more episodes of amaurosis or headaches. The dose has been gradually reduced from initial dosage of 60 mg  With his ESR continuing to be normal with the steroid taper he is now taking 5 mg alt with 10 mg daily  ESR is normal as of 10/15 He does not have any recurrence of his headaches or visual symptoms.     Lab Results  Component Value Date   ESRSEDRATE 10 05/31/2014    Weight gain:  He is  Trying to be watching his diet better and doing a little  exercise but not regularly   Wt Readings from Last 3 Encounters:  06/28/14 201 lb 9.6 oz (91.445 kg)  04/27/14 199 lb 9.6 oz (90.538 kg)  03/16/14 207 lb 6.4 oz (94.076 kg)   Long-standing hypogonadism: He has been treated by his urologist. He had been taking testosterone injections for about 2 years and was doing fairly well with this. With seeing a new urologist he has been treated with a Testopel pellet in 5/15 and last testosterone level was excellent  Lab Results  Component Value Date   TESTOSTERONE 433.39 03/16/2014    ERECTILE DYSFUNCTION: He is asking for treatment. He has samples of Cialis from urologist but previously drugs in the category have not worked He is asking about less expensive alternatives      Medication List       This list is accurate as of: 06/28/14 10:43 AM.  Always use your most recent med list.               albuterol 108 (90 BASE) MCG/ACT inhaler  Commonly known as:  PROVENTIL HFA;VENTOLIN HFA  Inhale 2 puffs into the lungs every 6 (six) hours as needed for wheezing or shortness of breath.     ALPRAZolam 0.5 MG tablet  Commonly known as:  XANAX  Take  1 tablet (0.5 mg total) by mouth 3 (three) times daily as needed for anxiety or sleep.     aspirin 325 MG tablet  Take 325 mg by mouth daily at 10 pm.     atorvastatin 20 MG tablet  Commonly known as:  LIPITOR  TAKE (1/2) TABLET DAILY.     buPROPion 150 MG 12 hr tablet  Commonly known as:  WELLBUTRIN SR  Take 150 mg by mouth every morning.     clobetasol cream 0.05 %  Commonly known as:  TEMOVATE     cyclobenzaprine 10 MG tablet  Commonly known as:  FLEXERIL  TAKE 1 TABLET ONCE DAILY.     imipramine 10 MG tablet  Commonly known as:  TOFRANIL  TAKE 2 TABLETS AT BEDTIME.     levothyroxine 88 MCG tablet  Commonly known as:  SYNTHROID, LEVOTHROID  TAKE 1 TABLET ONCE DAILY.     predniSONE 10 MG tablet  Commonly known as:  DELTASONE  Take 1 tablet (10 mg total) by mouth daily.      ROZEREM 8 MG tablet  Generic drug:  ramelteon  TAKE ONE TABLET AT BEDTIME.     sildenafil 20 MG tablet  Commonly known as:  REVATIO  Take 1 tablet (20 mg total) by mouth as needed.     tadalafil 5 MG tablet  Commonly known as:  CIALIS  Take 1 tablet (5 mg total) by mouth daily as needed for erectile dysfunction.     tamsulosin 0.4 MG Caps capsule  Commonly known as:  FLOMAX  TAKE (1) CAPSULE DAILY.     venlafaxine XR 75 MG 24 hr capsule  Commonly known as:  EFFEXOR-XR  TAKE 1 CAPSULE ONCE DAILY WITH BREAKFAST.     venlafaxine XR 37.5 MG 24 hr capsule  Commonly known as:  EFFEXOR-XR        Review of Systems He has a prior history of mild hypertension; because of low normal blood pressure readings his medications were stopped and blood pressure is still normal  He  is now taking 2 tablets of Flomax  Hypercholesterolemia: This has been long-standing and he has been well controlled with Lipitor 20 mg, half tablet daily and has had recent labs done.  Lab Results  Component Value Date   CHOL 186 02/08/2014   HDL 78.50 02/08/2014   LDLCALC 93 02/08/2014   TRIG 72.0 02/08/2014   CHOLHDL 2 02/08/2014          Objective:   Physical Exam BP 136/82 mmHg  Pulse 85  Temp(Src) 98 F (36.7 C)  Resp 14  Ht 5' 9"  (1.753 m)  Wt 201 lb 9.6 oz (91.445 kg)  BMI 29.76 kg/m2  SpO2 95%   No puffiness of face or cushingoid features   Assessment:      Temporal arteritis/giant cell arteritis, biopsy-proven Good response to  prednisone with resolution of visual symptoms. He is currently on  10 mg alternating with 5 mg and ESR will be checked today    Depression: Although he is subjectively better with increasing Effexor he tends to get little drowsy with this and still has a low mood and motivation level      Plan:      Adjust the prednisone dose based on though ESR today   May try adding Wellbutrin to help his depressed mood and decreased motivation while reducing the  Effexor  Follow-up with psychiatrist  More consistent exercise  He can discuss treatment of ED with his  urologist  Counseling time over 50% of today's 25 minute visit  Christopher Glasscock

## 2014-06-28 NOTE — Progress Notes (Signed)
Quick Note:  Please let patient know that the Sedimentation rate is normal, to reduce prednisone to half tablet daily ______

## 2014-07-24 ENCOUNTER — Other Ambulatory Visit: Payer: Self-pay | Admitting: Endocrinology

## 2014-08-08 ENCOUNTER — Ambulatory Visit (INDEPENDENT_AMBULATORY_CARE_PROVIDER_SITE_OTHER): Payer: Medicare Other | Admitting: Endocrinology

## 2014-08-08 ENCOUNTER — Encounter: Payer: Self-pay | Admitting: Endocrinology

## 2014-08-08 VITALS — BP 168/91 | HR 84 | Temp 98.5°F | Resp 16 | Ht 64.0 in | Wt 205.6 lb

## 2014-08-08 DIAGNOSIS — E063 Autoimmune thyroiditis: Secondary | ICD-10-CM

## 2014-08-08 DIAGNOSIS — E038 Other specified hypothyroidism: Secondary | ICD-10-CM

## 2014-08-08 DIAGNOSIS — E291 Testicular hypofunction: Secondary | ICD-10-CM | POA: Diagnosis not present

## 2014-08-08 DIAGNOSIS — F329 Major depressive disorder, single episode, unspecified: Secondary | ICD-10-CM

## 2014-08-08 DIAGNOSIS — F32A Depression, unspecified: Secondary | ICD-10-CM

## 2014-08-08 MED ORDER — LISINOPRIL 10 MG PO TABS: 10.0000 mg | ORAL_TABLET | Freq: Every day | ORAL | Status: DC

## 2014-08-08 MED ORDER — BUPROPION HCL ER (SR) 100 MG PO TB12: 100.0000 mg | ORAL_TABLET | Freq: Two times a day (BID) | ORAL | Status: DC

## 2014-08-08 NOTE — Progress Notes (Signed)
Subjective:      Patient ID: Benjamin Hahn, male   DOB: March 07, 1933, 80 y.o.   MRN: [id number]  Chief complaint: Multiple problems  HPI   He had been having increased depression in 6/15 and Effexor was increased to 112.5 g total dosage from previous doses of 75 and 37.5 mg.  Although he had reported improved mood and sleep is now complaining of not feeling well and more depressed.  He is getting some stress at work also.  Has not been taking Wellbutrin since his Effexor was increased and he thinks that combining 150 mg of Wellbutrin tended to cause some side effects.  No anxiety.  Also has not seen his psychiatrist as instructed.     Has been able to sleep well with his usual regimen of Flexeril, imipramine, melatonin and alprazolam  He says he has been following periodically and this is mostly difficulty with going up and down steps.  He does not think he has any weakness and not clear about his balance.  He says he walks more slowly.  He has fallen on his steps at work and had a significant bruise.  No dizziness.    He has a prior history of mild hypertension; because of low normal blood pressure readings he has not been on any medications and blood pressure is unusually high today.  He has not been checking this at home.  Blood pressure appears to be high on 3 different measurements today.  He is not having any headaches but he is very anxious about his blood pressure being high   Hypothyroidism: This is long-standing and he is usually on a stable dose.  When his TSH was low in 4/14  the dose was reduced to 88 mcg. Last lab work was as follows:   Lab Results  Component Value Date   FREET4 0.83 04/24/2014   FREET4 1.11 12/01/2013   FREET4 0.96 09/28/2013   TSH 1.67 04/24/2014   TSH 1.37 02/08/2014   TSH 0.03* 12/01/2013     Temporal arteritis:  Background history: In early October he was having episodes of visual loss, starting in the left eye first and then subsequently  bilateral He was also having a mild headache for about 10 days Because of his above symptoms and high ESR of 55 he was started on 60 mg prednisone Also had a temporal artery biopsy which was positive for temporal arteritis RECENT HISTORY: Since starting prednisone he has not had any more episodes of amaurosis or headaches. The dose has been gradually reduced from initial dosage of 60 mg  With his ESR continuing to be normal with the steroid taper he is now taking 5 mg daily ESR is normal as of 11/15 He does not have any recent  headaches or visual symptoms.   Lab Results  Component Value Date   ESRSEDRATE 8 06/28/2014    Weight gain:  He is again very concerned about his weight gain.  This is despite reducing his prednisone.  Also has gained only 4 pounds since his last visit.  He thinks he is watching his portions but is asking for a weight loss diet.  Does not want to see the dietitian.  Not motivated to do much exercise.    Wt Readings from Last 3 Encounters:  08/08/14 205 lb 9.6 oz (93.26 kg)  06/28/14 201 lb 9.6 oz (91.445 kg)  04/27/14 199 lb 9.6 oz (90.538 kg)       Medication List  This list is accurate as of: 08/08/14  4:17 PM.  Always use your most recent med list.               albuterol 108 (90 BASE) MCG/ACT inhaler  Commonly known as:  PROVENTIL HFA;VENTOLIN HFA  Inhale 2 puffs into the lungs every 6 (six) hours as needed for wheezing or shortness of breath.     ALPRAZolam 0.5 MG tablet  Commonly known as:  XANAX  Take 1 tablet (0.5 mg total) by mouth 3 (three) times daily as needed for anxiety or sleep.     aspirin 325 MG tablet  Take 325 mg by mouth daily at 10 pm.     atorvastatin 20 MG tablet  Commonly known as:  LIPITOR  TAKE (1/2) TABLET DAILY.     buPROPion 150 MG 12 hr tablet  Commonly known as:  WELLBUTRIN SR  Take 150 mg by mouth every morning.     clobetasol cream 0.05 %  Commonly known as:  TEMOVATE     cyclobenzaprine 10 MG  tablet  Commonly known as:  FLEXERIL  TAKE 1 TABLET ONCE DAILY.     imipramine 10 MG tablet  Commonly known as:  TOFRANIL  TAKE 2 TABLETS AT BEDTIME.     levothyroxine 88 MCG tablet  Commonly known as:  SYNTHROID, LEVOTHROID  TAKE 1 TABLET ONCE DAILY.     predniSONE 10 MG tablet  Commonly known as:  DELTASONE  Take 1 tablet (10 mg total) by mouth daily.     ROZEREM 8 MG tablet  Generic drug:  ramelteon  TAKE ONE TABLET AT BEDTIME.     sildenafil 20 MG tablet  Commonly known as:  REVATIO  Take 1 tablet (20 mg total) by mouth as needed.     tadalafil 5 MG tablet  Commonly known as:  CIALIS  Take 1 tablet (5 mg total) by mouth daily as needed for erectile dysfunction.     tamsulosin 0.4 MG Caps capsule  Commonly known as:  FLOMAX  TAKE (1) CAPSULE DAILY.     venlafaxine XR 75 MG 24 hr capsule  Commonly known as:  EFFEXOR-XR  TAKE 1 CAPSULE ONCE DAILY WITH BREAKFAST.     venlafaxine XR 37.5 MG 24 hr capsule  Commonly known as:  EFFEXOR-XR  TAKE 1 CAPSULE DAILY WITH BREAKFAST.        Review of Systems    Long-standing hypogonadism: He has been treated by his urologist. He had been taking testosterone injections for about 2 years and was doing fairly well with this. With seeing a new urologist he has been treated with a Testopel pellet in 5/15 and last testosterone level was excellent  Lab Results  Component Value Date   TESTOSTERONE 433.39 03/16/2014    ERECTILE DYSFUNCTION:  He has samples of Cialis from urologist but previously drugs in the category have not worked Currently has not been overall feeling well with loss of his depression and the symptom is not a primary concern today  He  is now taking 2 tablets of Flomax For his BPH from urologist  Hypercholesterolemia: This has been long-standing and he has been well controlled with Lipitor 20 mg, half tablet daily and labs as follows:  Lab Results  Component Value Date   CHOL 186 02/08/2014   HDL 78.50  02/08/2014   LDLCALC 93 02/08/2014   TRIG 72.0 02/08/2014   CHOLHDL 2 02/08/2014     No recent swelling of the feet. No joint   pain, has nonspecific lower leg pains  Appetite is fairly good.  Has mild weight gain  No recent difficulties with sleep  No shortness of breath recently     Objective:   Physical Exam BP 168/91 mmHg  Pulse 84  Temp(Src) 98.5 F (36.9 C)  Resp 16  Ht 5' 4" (1.626 m)  Wt 205 lb 9.6 oz (93.26 kg)  BMI 35.27 kg/m2  SpO2 95%  Appears somewhat anxious. He has significant abdominal obesity  No puffiness of face or cushingoid features  He has marked ecchymosis on his right lateral upper thigh and lowest part of the abdomen He has a little difficulty getting up and down the step and is walking relatively slowly with a slightly wide-based gait   Assessment:      Marked increase in systolic and diastolic blood pressure readings is unusual and he has not taken any indications that would cause this.  Although he is somewhat anxious today this is not a primary problem.    Temporal arteritis/giant cell arteritis, biopsy-proven Good response to  prednisone with resolution of visual symptoms  And no recurrence.  No headaches. He is currently on 5 mg  since 11/18 and ESR will be checked today    Depression: Although he was better with increasing Effexor he  appears to be complaining about more depression, having difficulty with dealing with work stress.  He wants to adjust his medications but has not had an appointment with the psychiatrist  Gait abnormality/falls.  Does not appear to have any obvious incoordination of gait today and no obvious neurological problems.  No recent headaches to suggest intracranial process.  He does appear to be walking relatively slowly although he thinks this is from legs being uncomfortable.  Have recommended neurology consultation but he wants to wait      Plan:       lisinopril 10 mg daily and follow-up in 2 weeks for  blood pressure  Trial of low-dose Wellbutrin SR for depression.  Needs to continue Effexor  Adjust the prednisone dose based on ESR today  Follow-up with psychiatrist  Start regular exercise program  Check TSH again to rule out worsening hypothyroidism  Advised him to see the dietitian but he will wait on this  Consider checking CPK and reducing Lipitor if he continues to have leg pains although he has been on Lipitor for several years Total visit time = 25 minutes    KUMAR,AJAY     

## 2014-08-08 NOTE — Patient Instructions (Addendum)
Start Lisinopril once daily  Start Welbutrin 100mg  in am daily

## 2014-08-09 ENCOUNTER — Other Ambulatory Visit (INDEPENDENT_AMBULATORY_CARE_PROVIDER_SITE_OTHER): Payer: Medicare Other

## 2014-08-09 DIAGNOSIS — E038 Other specified hypothyroidism: Secondary | ICD-10-CM

## 2014-08-09 DIAGNOSIS — M316 Other giant cell arteritis: Secondary | ICD-10-CM | POA: Diagnosis not present

## 2014-08-09 DIAGNOSIS — E7800 Pure hypercholesterolemia, unspecified: Secondary | ICD-10-CM

## 2014-08-09 DIAGNOSIS — E063 Autoimmune thyroiditis: Secondary | ICD-10-CM

## 2014-08-09 LAB — LIPID PANEL
Cholesterol: 153 mg/dL (ref 0–200)
HDL: 54.5 mg/dL (ref >39.00)
LDL CALC: 84 mg/dL (ref 0–99)
NonHDL: 98.5
TRIGLYCERIDES: 72 mg/dL (ref 0.0–149.0)
Total CHOL/HDL Ratio: 3
VLDL: 14.4 mg/dL (ref 0.0–40.0)

## 2014-08-09 LAB — COMPREHENSIVE METABOLIC PANEL
ALBUMIN: 3.8 g/dL (ref 3.5–5.2)
ALT: 16 U/L (ref 0–53)
AST: 19 U/L (ref 0–37)
Alkaline Phosphatase: 41 U/L (ref 39–117)
BUN: 17 mg/dL (ref 6–23)
CO2: 29 mEq/L (ref 19–32)
CREATININE: 1.3 mg/dL (ref 0.4–1.5)
Calcium: 8.9 mg/dL (ref 8.4–10.5)
Chloride: 103 mEq/L (ref 96–112)
GFR: 55.54 mL/min — ABNORMAL LOW (ref >60.00)
GLUCOSE: 93 mg/dL (ref 70–99)
Potassium: 4.1 mEq/L (ref 3.5–5.1)
Sodium: 140 mEq/L (ref 135–145)
Total Bilirubin: 0.7 mg/dL (ref 0.2–1.2)
Total Protein: 6.4 g/dL (ref 6.0–8.3)

## 2014-08-09 LAB — TSH: TSH: 3.19 u[IU]/mL (ref 0.35–4.50)

## 2014-08-09 LAB — SEDIMENTATION RATE: Sed Rate: 9 mm/hr (ref 0–22)

## 2014-08-11 NOTE — Progress Notes (Signed)
Quick Note:  Please let patient know that all the labs are normal and can reduce the prednisone to half tablet every other day for one week and then stop  ______

## 2014-08-15 DIAGNOSIS — N401 Enlarged prostate with lower urinary tract symptoms: Secondary | ICD-10-CM | POA: Diagnosis not present

## 2014-08-15 DIAGNOSIS — R3919 Other difficulties with micturition: Secondary | ICD-10-CM | POA: Diagnosis not present

## 2014-08-15 DIAGNOSIS — R339 Retention of urine, unspecified: Secondary | ICD-10-CM | POA: Diagnosis not present

## 2014-08-15 DIAGNOSIS — R3915 Urgency of urination: Secondary | ICD-10-CM | POA: Diagnosis not present

## 2014-08-15 DIAGNOSIS — E291 Testicular hypofunction: Secondary | ICD-10-CM | POA: Diagnosis not present

## 2014-08-27 ENCOUNTER — Other Ambulatory Visit: Payer: Self-pay | Admitting: *Deleted

## 2014-08-27 MED ORDER — ALPRAZOLAM 0.5 MG PO TABS: 0.5000 mg | ORAL_TABLET | Freq: Three times a day (TID) | ORAL | Status: DC | PRN

## 2014-08-29 ENCOUNTER — Encounter: Payer: Self-pay | Admitting: Endocrinology

## 2014-08-29 ENCOUNTER — Ambulatory Visit (INDEPENDENT_AMBULATORY_CARE_PROVIDER_SITE_OTHER): Payer: Medicare Other | Admitting: Endocrinology

## 2014-08-29 VITALS — BP 100/58 | HR 89 | Temp 98.2°F | Resp 14 | Ht 64.0 in | Wt 203.2 lb

## 2014-08-29 DIAGNOSIS — E038 Other specified hypothyroidism: Secondary | ICD-10-CM | POA: Diagnosis not present

## 2014-08-29 DIAGNOSIS — F32A Depression, unspecified: Secondary | ICD-10-CM

## 2014-08-29 DIAGNOSIS — F329 Major depressive disorder, single episode, unspecified: Secondary | ICD-10-CM

## 2014-08-29 DIAGNOSIS — E063 Autoimmune thyroiditis: Secondary | ICD-10-CM

## 2014-08-29 DIAGNOSIS — I951 Orthostatic hypotension: Secondary | ICD-10-CM | POA: Diagnosis not present

## 2014-08-29 NOTE — Progress Notes (Signed)
Subjective:      Patient ID: Benjamin Hahn, male   DOB: [birth year], 81 y.o.   MRN: [id number]  Chief complaint: Follow-up of blood pressure and other problems  HPI    He has a prior history of mild hypertension; because of low normal blood pressure readings he had not been on any medications.  Because of his blood pressure being 168/90 in 12/15 he was started on lisinopril 10 mg daily.  Does not feel any headaches or lightheadedness.     He had been having increased depression in 6/15 and Effexor was increased to 112.5 g total dosage from previous doses of 75 and 37.5 mg.   Has now been taking Wellbutrin and feels better with his depression and mood most of the time.  Still has not contacted his new psychiatrist as discussed.  Has been able to sleep well with his usual regimen of Flexeril, imipramine, melatonin a    Hypothyroidism: This is long-standing and he is usually on a stable dose.  When his TSH was low in 4/14  the dose was reduced to 88 mcg. Last lab work was as follows:   Lab Results  Component Value Date   FREET4 0.83 04/24/2014   FREET4 1.11 12/01/2013   FREET4 0.96 09/28/2013   TSH 3.19 08/09/2014   TSH 1.67 04/24/2014   TSH 1.37 02/08/2014     Temporal arteritis:  Background history: In early October 2014 he was having episodes of visual loss, starting in the left eye first and then subsequently bilateral He was also having a mild headache for about 10 days Because of his above symptoms and high ESR of 55 he was started on 60 mg prednisone Also had a temporal artery biopsy which was positive for temporal arteritis  RECENT HISTORY: With prednisone he had not had any more episodes of amaurosis or headaches. The dose has been gradually reduced from initial dosage of 60 mg and is now off prednisone since 12/15   Lab Results  Component Value Date   ESRSEDRATE 9 08/09/2014    Weight gain:  He is starting to exercise on his treadmill and weight is slightly  better.   Wt Readings from Last 3 Encounters:  08/29/14 203 lb 3.2 oz (92.171 kg)  08/08/14 205 lb 9.6 oz (93.26 kg)  06/28/14 201 lb 9.6 oz (91.445 kg)       Medication List       This list is accurate as of: 08/29/14  4:52 PM.  Always use your most recent med list.               albuterol 108 (90 BASE) MCG/ACT inhaler  Commonly known as:  PROVENTIL HFA;VENTOLIN HFA  Inhale 2 puffs into the lungs every 6 (six) hours as needed for wheezing or shortness of breath.     ALPRAZolam 0.5 MG tablet  Commonly known as:  XANAX  Take 1 tablet (0.5 mg total) by mouth 3 (three) times daily as needed for anxiety or sleep.     aspirin 325 MG tablet  Take 325 mg by mouth daily at 10 pm.     atorvastatin 20 MG tablet  Commonly known as:  LIPITOR  TAKE (1/2) TABLET DAILY.     buPROPion 100 MG 12 hr tablet  Commonly known as:  WELLBUTRIN SR  Take 1 tablet (100 mg total) by mouth 2 (two) times daily.     clobetasol cream 0.05 %  Commonly known as:  TEMOVATE  cyclobenzaprine 10 MG tablet  Commonly known as:  FLEXERIL  TAKE 1 TABLET ONCE DAILY.     imipramine 10 MG tablet  Commonly known as:  TOFRANIL  TAKE 2 TABLETS AT BEDTIME.     levothyroxine 88 MCG tablet  Commonly known as:  SYNTHROID, LEVOTHROID  TAKE 1 TABLET ONCE DAILY.     lisinopril 10 MG tablet  Commonly known as:  PRINIVIL,ZESTRIL  Take 1 tablet (10 mg total) by mouth daily.     predniSONE 10 MG tablet  Commonly known as:  DELTASONE  Take 1 tablet (10 mg total) by mouth daily.     ROZEREM 8 MG tablet  Generic drug:  ramelteon  TAKE ONE TABLET AT BEDTIME.     sildenafil 20 MG tablet  Commonly known as:  REVATIO  Take 1 tablet (20 mg total) by mouth as needed.     tadalafil 5 MG tablet  Commonly known as:  CIALIS  Take 1 tablet (5 mg total) by mouth daily as needed for erectile dysfunction.     tamsulosin 0.4 MG Caps capsule  Commonly known as:  FLOMAX  TAKE (1) CAPSULE DAILY.     venlafaxine XR 75  MG 24 hr capsule  Commonly known as:  EFFEXOR-XR  TAKE 1 CAPSULE ONCE DAILY WITH BREAKFAST.     venlafaxine XR 37.5 MG 24 hr capsule  Commonly known as:  EFFEXOR-XR  TAKE 1 CAPSULE DAILY WITH BREAKFAST.        Review of Systems    Long-standing hypogonadism: He has been treated by his urologist. He had been taking testosterone injections for about 2 years and was doing fairly well with this. With seeing a new urologist he has been treated with a Testopel pellet in 5/15 and last testosterone level was excellent  Lab Results  Component Value Date   TESTOSTERONE 433.39 03/16/2014    Hypercholesterolemia: This has been long-standing and he has been well controlled with Lipitor 20 mg, half tablet daily and labs as follows:  Lab Results  Component Value Date   CHOL 153 08/09/2014   HDL 54.50 08/09/2014   LDLCALC 84 08/09/2014   TRIG 72.0 08/09/2014   CHOLHDL 3 08/09/2014      No recent difficulties with sleep, uses 20 Imipramine        Objective:   Physical Exam BP 109/62 mmHg  Pulse 89  Temp(Src) 98.2 F (36.8 C)  Resp 14  Ht 5' 4"  (1.626 m)  Wt 203 lb 3.2 oz (92.171 kg)  BMI 34.86 kg/m2  SpO2 95%    Standing blood pressure 100/58   Assessment:      The blood pressure is much lower with 10 mg lisinopril and was likely his increased pressure last month was related to stress and anxiety.    Temporal arteritis/giant cell arteritis, biopsy-proven.  No symptoms with stopping prednisone    Depression: Improving with adding Wellbutrin low-dose and has no side effects with this.  Weight gain: This is improving with starting exercise and encouraged him to continue walking on the treadmill     Plan:      Stop lisinopril 10 mg daily and follow-up in 3 months for blood pressure and wellness physical  Appointment with psychiatrist     [facility]

## 2014-08-29 NOTE — Patient Instructions (Addendum)
Stop Lisinopril.

## 2014-09-01 ENCOUNTER — Other Ambulatory Visit: Payer: Self-pay | Admitting: Endocrinology

## 2014-09-03 MED ORDER — CYCLOBENZAPRINE HCL 10 MG PO TABS: 10.0000 mg | ORAL_TABLET | Freq: Every day | ORAL | Status: DC

## 2014-09-21 DIAGNOSIS — M7582 Other shoulder lesions, left shoulder: Secondary | ICD-10-CM | POA: Diagnosis not present

## 2014-09-29 ENCOUNTER — Other Ambulatory Visit: Payer: Self-pay | Admitting: Endocrinology

## 2014-11-12 ENCOUNTER — Other Ambulatory Visit: Payer: Self-pay | Admitting: *Deleted

## 2014-11-12 ENCOUNTER — Telehealth: Payer: Self-pay | Admitting: Endocrinology

## 2014-11-12 MED ORDER — CLOBETASOL PROPIONATE 0.05 % EX CREA: TOPICAL_CREAM | CUTANEOUS | Status: DC

## 2014-11-12 NOTE — Telephone Encounter (Signed)
ok 

## 2014-11-12 NOTE — Telephone Encounter (Signed)
Patient called and would like for Dr. Dwyane Dee to please fill his Rx He no longer see's his dermatologist and has dry skin from time to time    Rx: Clobetasol 0.05%  Pharmacy: [facility]   Thank you

## 2014-11-12 NOTE — Telephone Encounter (Signed)
rx sent

## 2014-11-12 NOTE — Telephone Encounter (Signed)
Please see below and advise if okay to send? 

## 2014-11-21 ENCOUNTER — Other Ambulatory Visit: Payer: Self-pay | Admitting: Endocrinology

## 2014-11-25 ENCOUNTER — Other Ambulatory Visit: Payer: Self-pay | Admitting: Endocrinology

## 2014-11-28 ENCOUNTER — Ambulatory Visit (INDEPENDENT_AMBULATORY_CARE_PROVIDER_SITE_OTHER): Payer: Medicare Other | Admitting: Endocrinology

## 2014-11-28 ENCOUNTER — Encounter: Payer: Self-pay | Admitting: Endocrinology

## 2014-11-28 ENCOUNTER — Other Ambulatory Visit: Payer: Self-pay | Admitting: Endocrinology

## 2014-11-28 VITALS — BP 120/78 | HR 84 | Temp 98.4°F | Ht 67.0 in | Wt 190.0 lb

## 2014-11-28 DIAGNOSIS — Z23 Encounter for immunization: Secondary | ICD-10-CM | POA: Diagnosis not present

## 2014-11-28 DIAGNOSIS — R269 Unspecified abnormalities of gait and mobility: Secondary | ICD-10-CM

## 2014-11-28 DIAGNOSIS — F329 Major depressive disorder, single episode, unspecified: Secondary | ICD-10-CM

## 2014-11-28 DIAGNOSIS — E7800 Pure hypercholesterolemia, unspecified: Secondary | ICD-10-CM

## 2014-11-28 DIAGNOSIS — Z0000 Encounter for general adult medical examination without abnormal findings: Secondary | ICD-10-CM | POA: Diagnosis not present

## 2014-11-28 DIAGNOSIS — M797 Fibromyalgia: Secondary | ICD-10-CM

## 2014-11-28 DIAGNOSIS — F32A Depression, unspecified: Secondary | ICD-10-CM

## 2014-11-28 DIAGNOSIS — E038 Other specified hypothyroidism: Secondary | ICD-10-CM

## 2014-11-28 DIAGNOSIS — E063 Autoimmune thyroiditis: Secondary | ICD-10-CM

## 2014-11-28 DIAGNOSIS — M6281 Muscle weakness (generalized): Secondary | ICD-10-CM | POA: Diagnosis not present

## 2014-11-28 LAB — SEDIMENTATION RATE: Sed Rate: 11 mm/hr (ref 0–22)

## 2014-11-28 LAB — CK: Total CK: 83 U/L (ref 7–232)

## 2014-11-28 LAB — VITAMIN B12: Vitamin B-12: 371 pg/mL (ref 211–911)

## 2014-11-28 MED ORDER — HYDROXYZINE HCL 10 MG PO TABS: 10.0000 mg | ORAL_TABLET | Freq: Every day | ORAL | Status: DC

## 2014-11-28 NOTE — Progress Notes (Signed)
Patient ID: Benjamin Hahn, male   DOB: [birth year], 81 y.o.   MRN: [id number]  SUBJECTIVE:  Patient is being seen today for Medicare annual wellness visit and review of chronic problems.   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 No excess sun exposure.  Diet, alcohol and Exercise  Current exercise habits: Treadmill, rowing 2-3/7 days a week at the gym. Dietary issues discussed: heart healthy diet. Has reduced carbs, portions recently in order to lose weight Alcohol: has cut intake to 1 drink daily, previously drinking 2 drinks daily, 1.5 ounces at a time  Depression Screen:  Results available in the depression screening section  Advance directives:  None available on chart  The following portions of the patient's history were reviewed and updated as appropriate: allergies, current medications, past family history, past medical history, past social history, past surgical history and problem list.   Most recent labs available were reviewed    Medication List       This list is accurate as of: 11/28/14  3:06 PM.  Always use your most recent med list.               albuterol 108 (90 BASE) MCG/ACT inhaler  Commonly known as:  PROVENTIL HFA;VENTOLIN HFA  Inhale 2 puffs into the lungs every 6 (six) hours as needed for wheezing or shortness of breath.     ALPRAZolam 0.5 MG tablet  Commonly known as:  XANAX  Take 1 tablet (0.5 mg  total) by mouth 3 (three) times daily as needed for anxiety or sleep.     aspirin 325 MG tablet  Take 325 mg by mouth daily at 10 pm.     atorvastatin 20 MG tablet  Commonly known as:  LIPITOR  TAKE (1/2) TABLET DAILY.     buPROPion 100 MG 12 hr tablet  Commonly known as:  WELLBUTRIN SR  TAKE 1 TABLET IN THE MORNING.     clobetasol cream 0.05 %  Commonly known as:  TEMOVATE  Use as directed     cyclobenzaprine 10 MG tablet  Commonly known as:  FLEXERIL  Take 1 tablet (10 mg total) by mouth daily.     imipramine 10 MG tablet  Commonly known as:  TOFRANIL  TAKE 2 TABLETS AT BEDTIME.     levothyroxine 88 MCG tablet  Commonly known as:  SYNTHROID, LEVOTHROID  TAKE 1 TABLET ONCE DAILY.     lisinopril 10 MG tablet  Commonly known as:  PRINIVIL,ZESTRIL  Take 1 tablet (10 mg total) by mouth daily.     sildenafil 20 MG tablet  Commonly known as:  REVATIO  Take 1 tablet (20 mg total) by mouth as needed.     tadalafil 5 MG tablet  Commonly known as:  CIALIS  Take 1 tablet (5 mg total) by mouth daily as needed for erectile dysfunction.     tamsulosin 0.4 MG Caps capsule  Commonly known as:  FLOMAX  TAKE (1) CAPSULE DAILY.     venlafaxine XR 75 MG 24 hr capsule  Commonly known as:  EFFEXOR-XR  TAKE 1 CAPSULE ONCE DAILY WITH BREAKFAST.     venlafaxine XR 37.5 MG 24 hr capsule  Commonly known as:  EFFEXOR-XR  TAKE 1 CAPSULE DAILY WITH BREAKFAST.        Allergies: No Known Allergies  Past Medical History  Diagnosis Date  . Hypothyroid   . Depression 1990s  . Shingles May 2014    "Right face, still has some"  . Hypercholesteremia     controled  . Anxiety   . Transient blindness of both eyes   . GERD (gastroesophageal reflux disease)     occasional  . Arthritis     might be in back, no problems  . Temporal arteritis   . BPH (benign prostatic hyperplasia)     Past Surgical History  Procedure Laterality Date  . None    . No past surgeries    . Artery biopsy  Right 06/20/2013    Procedure: BIOPSY TEMPORAL ARTERY RIGHT;  Surgeon: Earnstine Regal, MD;  Location: [facility];  Service: General;  Laterality: Right;    Family History  Problem Relation Age of Onset  . Stroke Father     Died, 80s  . Stroke Sister     Living, 65  . Heart disease Mother     Died, 61  . Healthy Daughter   . Diabetes Maternal Grandmother   . Hypertension Neg Hx     Social History:  reports that he quit smoking about 48 years ago. His smoking use included Cigarettes. He has never used smokeless tobacco. He reports that he drinks about 1.2 oz of alcohol per week. He reports that he does not use illicit drugs.   Review of Systems:  Memory problems: These are mild and not interfering with his work.  Sometimes will forgets what he is talking about in conversations at home but able to remember what he ate last night and this morning   Denies hearing loss, and visual loss Does not have any changes in appetite    Objective  BP 120/78 mmHg  Pulse 84  Temp(Src) 98.4 F (36.9 C) (Oral)  Ht _0  (1.702 m)  Wt 190 lb (86.183 kg)  BMI 29.75 kg/m2  Vision:  Normal, has annual eye exams   Hearing: grossly normal Body mass index:  See vitals Neurological/balance: He has difficulty getting up from his chair and take some time.  His gait is relatively slow and slightly shuffling Cognitive Impairment Assessment: cognition, memory and judgment appear normal.  remembers 3/3 at 5 minutes. Has excellent recall.  Has had no difficulty reading and writing.  He is very alert and aware of medical and other issues with him  Assessment  Medicare wellness evaluation done   Preventive parameters reviewed   Plan  During the course of the visit the patient was educated and counseled about appropriate screening and preventive services including:        Fall prevention   Diabetes screening  Nutrition counseling Exercise regimen discussed and encouraged him to continue  Lipid  screening Colorectal screening to be done in 5/16 with [facility].  May not be necessary to have colonoscopy at his age Prostate exams/PSA discussed and deferred to urologist Regular eye and dental exams  Vaccines / LABS Flu vaccine/Zostavax / Pneumococcal Vaccine/Prevnar up-to-date Tetanus booster given   Patient Instructions (the written plan) was given to the  patient.   [facility] 11/28/2014    Extremity Weakness  This is a chronic problem. The current episode started more than 1 month ago. There has been no history of extremity trauma. The problem occurs daily. The problem has been unchanged. Pertinent negatives include no numbness or stiffness.   He has difficulty getting up from sitting positions and also going upstairs. He feels unsteady while walking at times but does not use any support. He has been recommended neurology consultations before and also today but he is reluctant to do this, stating that it is related to his age He has no muscle pain, no use of recent steroids which were stopped in late 2015 after gradually tapering them off but his leg weakness and unsteadiness is not better.  EXAM: He has difficulty getting up from his chair without pushing up on his hand and take some time.  Gait is slightly slow and somewhat shuffling  PLAN: Check CPK, B-12 and ESR and hold off Lipitor for a month.  If not better will see neurologist   PROBLEM 2: .     He had been having increased depression in the last few months and in 6/15  Effexor was increased to 112.5 g total dosage from previous doses of 75 and 37.5 mg.   Has also been taking Wellbutrin and feels somewhat better with his depression and mood most.  Sometimes does have  difficulty with motivation but is not feeling down and depressed.  Still has not been able to schedule an appointment with the psychiatrist he was supposed to contact.  Does not think he has excessive anxiety or panic   INSOMNIA:   Has been able to  sleep well with his usual regimen of imipramine, melatonin and Xanax but he now says that he tried a prescription of Atarax 10 mg which was given for other reasons and he slept better.  Asking about a prescription for this.  I recommended that he can try the Atarax 10 mg at night as needed but he will need to leave off his Xanax at the same time  Medication review: All his medications were reviewed for accuracy, indications and how he is taking them.  He sometimes takes Flexeril in the daytime for mild muscle ache or stiffness, previously had been diagnosed to have fibromyalgia but not clear if he is taking this regularly   Hypothyroidism: This is long-standing and he is usually on a stable dose.  More recently has been taking smaller doses than before  Lab Results  Component Value Date   TSH 3.19 08/09/2014   TSH 1.67 04/24/2014   TSH 1.37 02/08/2014   FREET4 0.83 04/24/2014   FREET4 1.11 12/01/2013   FREET4 0.96 09/28/2013     HYPERCHOLESTEROLEMIA: Because of his relatively low LDL his dose was reduced to half tablet of the 20 mg.  Previously has had significant hypercholesterolemia and lipid levels are still fairly good as of 12/15.  He says he is compliant with his medication  Lab Results  Component Value Date   CHOL 153 08/09/2014   HDL 54.50 08/09/2014   LDLCALC 84 08/09/2014   TRIG 72.0 08/09/2014   CHOLHDL 3 08/09/2014     History of temporal arteritis: He has not had any recurrence of headaches or visual difficulties  Alyssah Algeo

## 2014-11-28 NOTE — Patient Instructions (Addendum)
Leave off Lipitor till nex visit   Leave off Xanax when using hydroxyzine  Low saturated fat diet, low intake of sugar and salt  Regular aerobic exercise 5 days a week  Regular dental and eye exams as recommended by the specialists  Make sure smoke detectors are functional at home Wear seat belts all the time

## 2014-11-28 NOTE — Progress Notes (Signed)
Pre visit review using our clinic review tool, if applicable. No additional management support is needed unless otherwise documented below in the visit note. 

## 2014-11-29 NOTE — Progress Notes (Signed)
Quick Note:  Please let patient know that the lab results normal, would like to have him see his neurologist if legs are still weak next month ______

## 2014-12-05 ENCOUNTER — Other Ambulatory Visit: Payer: Self-pay | Admitting: Endocrinology

## 2014-12-17 ENCOUNTER — Other Ambulatory Visit: Payer: Self-pay | Admitting: Endocrinology

## 2015-01-03 ENCOUNTER — Other Ambulatory Visit: Payer: Self-pay | Admitting: Endocrinology

## 2015-01-23 ENCOUNTER — Other Ambulatory Visit (INDEPENDENT_AMBULATORY_CARE_PROVIDER_SITE_OTHER): Payer: Medicare Other

## 2015-01-23 DIAGNOSIS — E7800 Pure hypercholesterolemia, unspecified: Secondary | ICD-10-CM

## 2015-01-23 DIAGNOSIS — M6281 Muscle weakness (generalized): Secondary | ICD-10-CM

## 2015-01-23 DIAGNOSIS — M797 Fibromyalgia: Secondary | ICD-10-CM

## 2015-01-23 DIAGNOSIS — E063 Autoimmune thyroiditis: Secondary | ICD-10-CM

## 2015-01-23 DIAGNOSIS — E038 Other specified hypothyroidism: Secondary | ICD-10-CM | POA: Diagnosis not present

## 2015-01-23 LAB — CBC
HCT: 38.9 % — ABNORMAL LOW (ref 39.0–52.0)
HEMOGLOBIN: 13 g/dL (ref 13.0–17.0)
MCHC: 33.5 g/dL (ref 30.0–36.0)
MCV: 92 fl (ref 78.0–100.0)
Platelets: 251 10*3/uL (ref 150.0–400.0)
RBC: 4.22 Mil/uL (ref 4.22–5.81)
RDW: 14.3 % (ref 11.5–15.5)
WBC: 7.6 10*3/uL (ref 4.0–10.5)

## 2015-01-24 LAB — LIPID PANEL
CHOL/HDL RATIO: 3
Cholesterol: 208 mg/dL — ABNORMAL HIGH (ref 0–200)
HDL: 61.4 mg/dL (ref >39.00)
LDL Cholesterol: 122 mg/dL — ABNORMAL HIGH (ref 0–99)
NONHDL: 146.6
Triglycerides: 124 mg/dL (ref 0.0–149.0)
VLDL: 24.8 mg/dL (ref 0.0–40.0)

## 2015-01-24 LAB — COMPREHENSIVE METABOLIC PANEL
ALBUMIN: 3.9 g/dL (ref 3.5–5.2)
ALK PHOS: 55 U/L (ref 39–117)
ALT: 20 U/L (ref 0–53)
AST: 18 U/L (ref 0–37)
BILIRUBIN TOTAL: 0.5 mg/dL (ref 0.2–1.2)
BUN: 19 mg/dL (ref 6–23)
CO2: 30 meq/L (ref 19–32)
Calcium: 9.1 mg/dL (ref 8.4–10.5)
Chloride: 103 mEq/L (ref 96–112)
Creatinine, Ser: 1.26 mg/dL (ref 0.40–1.50)
GFR: 58.53 mL/min — ABNORMAL LOW (ref >60.00)
GLUCOSE: 89 mg/dL (ref 70–99)
POTASSIUM: 4.6 meq/L (ref 3.5–5.1)
Sodium: 138 mEq/L (ref 135–145)
Total Protein: 6.6 g/dL (ref 6.0–8.3)

## 2015-01-24 LAB — T4, FREE: Free T4: 0.78 ng/dL (ref 0.60–1.60)

## 2015-01-24 LAB — TSH: TSH: 1.24 u[IU]/mL (ref 0.35–4.50)

## 2015-01-28 ENCOUNTER — Encounter: Payer: Self-pay | Admitting: Endocrinology

## 2015-01-28 ENCOUNTER — Ambulatory Visit (INDEPENDENT_AMBULATORY_CARE_PROVIDER_SITE_OTHER): Payer: Medicare Other | Admitting: Endocrinology

## 2015-01-28 VITALS — BP 130/70 | HR 75 | Temp 97.8°F | Resp 16 | Ht 67.0 in | Wt 195.0 lb

## 2015-01-28 DIAGNOSIS — E785 Hyperlipidemia, unspecified: Secondary | ICD-10-CM

## 2015-01-28 DIAGNOSIS — E291 Testicular hypofunction: Secondary | ICD-10-CM

## 2015-01-28 DIAGNOSIS — E038 Other specified hypothyroidism: Secondary | ICD-10-CM

## 2015-01-28 DIAGNOSIS — E063 Autoimmune thyroiditis: Secondary | ICD-10-CM

## 2015-01-28 DIAGNOSIS — F329 Major depressive disorder, single episode, unspecified: Secondary | ICD-10-CM

## 2015-01-28 DIAGNOSIS — M6281 Muscle weakness (generalized): Secondary | ICD-10-CM

## 2015-01-28 DIAGNOSIS — F32A Depression, unspecified: Secondary | ICD-10-CM

## 2015-01-28 NOTE — Progress Notes (Signed)
Subjective:      Patient ID: Benjamin Hahn, male   DOB: 07/27/1933, 81 y.o.   MRN: [id number]  Chief complaint: Follow-up of depression and other problems  HPI     LEG weakness: He is at difficulty getting up stairs and so notices some weakness on walking.  He thinks this is slightly better with stopping Lipitor empirically.  All his labs were normal.  No numbness in his feet and no low back pain or sciatica.    He had been having increased depression since 2015 and Effexor was increased to 112.5 g total dosage from previous doses of 75 and 37.5 mg.   Has also been taking Wellbutrin 100 mg in the morning but still thinks he is having a depressed mood and lack of motivation.   Has been able to sleep well with his usual regimen of Xanax, imipramine, melatonin and also Flexeril at times    Hypothyroidism: This is long-standing and he is usually on a stable dose.  When his TSH was low in 4/14  the dose was reduced to 88 mcg. Last lab work was as follows:   Lab Results  Component Value Date   FREET4 0.78 01/23/2015   FREET4 0.83 04/24/2014   FREET4 1.11 12/01/2013   TSH 1.24 01/23/2015   TSH 3.19 08/09/2014   TSH 1.67 04/24/2014       Weight gain: His weight has fluctuated, is exercising at times but not consistently    Wt Readings from Last 3 Encounters:  01/28/15 195 lb (88.451 kg)  11/28/14 190 lb (86.183 kg)  08/29/14 203 lb 3.2 oz (92.171 kg)       Medication List       This list is accurate as of: 01/28/15 11:59 PM.  Always use your most recent med list.               albuterol 108 (90 BASE) MCG/ACT inhaler  Commonly known as:  PROVENTIL HFA;VENTOLIN HFA  Inhale 2 puffs into the lungs every 6 (six) hours as needed for wheezing or shortness of breath.     ALPRAZolam 0.5 MG tablet  Commonly known as:  XANAX  TAKE ONE TABLET 3 TIMES A DAY AS NEEDED FOR ANXIETY OR SLEEP.     aspirin 325 MG tablet  Take 325 mg by mouth daily at 10 pm.     buPROPion 100  MG 12 hr tablet  Commonly known as:  WELLBUTRIN SR  TAKE 1 TABLET IN THE MORNING.     clobetasol cream 0.05 %  Commonly known as:  TEMOVATE  Use as directed     cyclobenzaprine 10 MG tablet  Commonly known as:  FLEXERIL  Take 1 tablet (10 mg total) by mouth daily.     hydrOXYzine 10 MG tablet  Commonly known as:  ATARAX/VISTARIL  TAKE 1 TO 3 TABLETS EVERY 4 TO 6 HOURS AS NEEDED FOR ITCHING. *MAY CAUSE DROWSINESS*     imipramine 10 MG tablet  Commonly known as:  TOFRANIL  TAKE 2 TABLETS AT BEDTIME.     levothyroxine 88 MCG tablet  Commonly known as:  SYNTHROID, LEVOTHROID  TAKE 1 TABLET ONCE DAILY.     sildenafil 20 MG tablet  Commonly known as:  REVATIO  Take 1 tablet (20 mg total) by mouth as needed.     tadalafil 5 MG tablet  Commonly known as:  CIALIS  Take 1 tablet (5 mg total) by mouth daily as needed for erectile dysfunction.  tamsulosin 0.4 MG Caps capsule  Commonly known as:  FLOMAX  TAKE (1) CAPSULE DAILY.     venlafaxine XR 75 MG 24 hr capsule  Commonly known as:  EFFEXOR-XR  TAKE 1 CAPSULE ONCE DAILY WITH BREAKFAST.     venlafaxine XR 37.5 MG 24 hr capsule  Commonly known as:  EFFEXOR-XR  TAKE 1 CAPSULE DAILY WITH BREAKFAST.        Review of Systems    Long-standing hypogonadism: He has been treated by his urologist.  With his urologist he has been treated with Testopel pellet and most likely got this in December last year His last testosterone level was excellent   Lab Results  Component Value Date   TESTOSTERONE 433.39 03/16/2014   Hypercholesterolemia: This has been long-standing and he has been taken off Lipitor because of leg weakness.  Recent LDL:  Lab Results  Component Value Date   CHOL 208* 01/23/2015   HDL 61.40 01/23/2015   LDLCALC 122* 01/23/2015   TRIG 124.0 01/23/2015   CHOLHDL 3 01/23/2015           Objective:   Physical Exam BP 130/70 mmHg  Pulse 75  Temp(Src) 97.8 F (36.6 C) (Oral)  Resp 16  Ht 5\' 7"  (1.702  m)  Wt 195 lb (88.451 kg)  BMI 30.53 kg/m2  SpO2 96%   He has difficulty getting up from the chair without pushing up with his hands  Assessment:      Leg weakness: He still has proximal leg weakness.  Do not think this is from having taken prednisone previously which he has stopped several months ago.  May be slightly better with stopping Lipitor.  Will consider neurology consultation.  However he is going to see Dr. Amalia Greenhouse this month and will defer management to him for now    Depression: Not improving enough with adding Wellbutrin low-dose and Effexor  Hypercholesterolemia: His LDL is 122 and he does not have any other risk factors  Long-standing erectile dysfunction: He has not had any benefits from using Cialis and Viagra and discussed that he needs to follow-up with urologist for this     Plan:      Additional 100 mg of Wellbutrin at lunchtime  Appointment with psychiatrist this month, will defer further management  He will avoid Flexeril as it is possibly causing some worsening of leg weakness  Management of hyperlipidemia with diet alone for now  Schedule follow-up with urologist, not clear if he is overdue for his Testopel     [facility]

## 2015-01-28 NOTE — Patient Instructions (Addendum)
Leave off flexeril  Follow up with Urologist  Try extra Welbutrin at lunch

## 2015-02-13 ENCOUNTER — Telehealth (HOSPITAL_COMMUNITY): Payer: Self-pay

## 2015-02-13 ENCOUNTER — Ambulatory Visit (INDEPENDENT_AMBULATORY_CARE_PROVIDER_SITE_OTHER): Payer: Medicare Other | Admitting: Psychiatry

## 2015-02-13 DIAGNOSIS — F331 Major depressive disorder, recurrent, moderate: Secondary | ICD-10-CM | POA: Diagnosis not present

## 2015-02-13 DIAGNOSIS — F3342 Major depressive disorder, recurrent, in full remission: Secondary | ICD-10-CM

## 2015-02-13 MED ORDER — IMIPRAMINE HCL 10 MG PO TABS: 10.0000 mg | ORAL_TABLET | Freq: Every day | ORAL | Status: DC

## 2015-02-13 MED ORDER — ALPRAZOLAM 0.5 MG PO TABS
0.5000 mg | ORAL_TABLET | Freq: Every day | ORAL | Status: DC
Start: 2015-02-13 — End: 2015-04-12

## 2015-02-13 MED ORDER — VENLAFAXINE HCL ER 150 MG PO CP24: 150.0000 mg | ORAL_CAPSULE | Freq: Every day | ORAL | Status: DC

## 2015-02-13 MED ORDER — BUPROPION HCL ER (SR) 100 MG PO TB12: ORAL_TABLET | ORAL | Status: DC

## 2015-02-13 NOTE — Progress Notes (Signed)
Psychiatric Initial Adult Assessment   Patient Identification: Benjamin Hahn MRN:  [id number] Date of Evaluation:  02/13/2015 Referral Source: Dr. 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 He comes because he needs a new psychiatrist and to reevaluate his medications. He does describe persistent daily depression has been present for years. Overall the patient is actually doing well. He is stable. He is married 71 years in a stable marriage. He is 2 healthy daughters 4 grandchildren all 4 well. The patient is in solo practice and is financially stressed. He's having difficulty with his firm. The patient is also having some physical problems and that is painful falling and has some problems with balance. Today the patient says despite being depressed is not Better or worse for years. He is sleeping and eating well. He's got good energy. Is no problems thinking or concentrating his psychomotor functioning is normal. The patient is not suspicious nor she somatic. He denies being suicidal now and has never made a suicide attempt. He enjoys music TV has a sex drive likes to read. He goes to the gym regularly. The patient denies the use of alcohol. He denies auditory or visual hallucinations. He does describe a past episode of major depression symptoms but he is a vague historian. The patient denies any symptoms consistent with mania. He denies symptoms of generalized anxiety disorder panic disorder or excessive compulsive disorder. Presently the patient is on a regime of medicine including Wellbutrin 100 slow release Alex 0.5 once a day imipramine 10 mg and Effexor 37.5 mg plus a 75 mg pill. The patient also takes melatonin. The patient is never been in a psychiatric hospital. In the past has been under the care of Dr. Cleda Mccreedy and then his last psychiatrist was Dr. Caryl Asp. This patient does not smoke cigarettes. He generally feels well. Today the  patient is clearly stable. He does acknowledge some mild low level depression and has questions about why he's taking divided doses of Effexor. This patient is never been a patient in a psychiatric hospital. He is also never been in psychotherapy. This patient denies chest pain or shortness of breath. He denies any neurological symptoms except for some minor problems with balance. The patient is medically well. His only complaints is some bright held dysfunction which she'll discuss with his primary care doctor. Chief Complaint:   depression Visit Diagnosis: Major depression recurrent mild   ICD-9-CM ICD-10-CM   1. Major depressive disorder, recurrent, in full remission 296.36 F33.42 venlafaxine XR (EFFEXOR-XR) 150 MG 24 hr capsule     ALPRAZolam (XANAX) 0.5 MG tablet   Diagnosis:   Patient Active Problem List   Diagnosis Date Noted  . Temporal arteritis [M31.6] 06/16/2013  . Amaurosis fugax [G45.3] 06/01/2013  . Unspecified hypothyroidism [E03.9] 03/14/2013  . Other and unspecified hyperlipidemia [E78.5] 03/14/2013  . BPH (benign prostatic hypertrophy) [N40.0] 03/14/2013  . Hypogonadism, male [E29.1] 03/14/2013  . Erectile dysfunction [N52.9] 03/14/2013   History of Present Illness:  Noted above Elements:   Associated Signs/Symptoms: Depression Symptoms:  Daily depression mild not paralyzing (Hypo) Manic Symptoms:   Anxiety Symptoms:   Psychotic Symptoms:  None PTSD Symptoms: None NA  Past Medical History:  Past Medical History  Diagnosis Date  . Hypothyroid   . Depression 1990s  . Shingles May 2014    "Right face, still has some"  . Hypercholesteremia     controled  . Anxiety   . Transient  blindness of both eyes   . GERD (gastroesophageal reflux disease)     occasional  . Arthritis     might be in back, no problems  . Temporal arteritis   . BPH (benign prostatic hyperplasia)     Past Surgical History  Procedure Laterality Date  . None    . No past surgeries    .  Artery biopsy Right 06/20/2013    Procedure: BIOPSY TEMPORAL ARTERY RIGHT;  Surgeon: Earnstine Regal, MD;  Location: [facility];  Service: General;  Laterality: Right;   Family History:  Family History  Problem Relation Age of Onset  . Stroke Father     Died, 80s  . Stroke Sister     Living, 96  . Heart disease Mother     Died, 13  . Healthy Daughter   . Diabetes Maternal Grandmother   . Hypertension Neg Hx    Social History:   History   Social History  . Marital Status: Married    Spouse Name: N/A  . Number of Children: N/A  . Years of Education: N/A   Social History Main Topics  . Smoking status: Former Smoker    Types: Cigarettes    Quit date: 08/24/1966  . Smokeless tobacco: Never Used  . Alcohol Use: 1.2 oz/week    2 Shots of liquor per week     Comment: socially  . Drug Use: No  . Sexual Activity: Not on file   Other Topics Concern  . Not on file   Social History Narrative   Currently works as a Midwife.  Lives with wife and they have two healthy daughters.   Additional Social History:   Musculoskeletal: Strength & Muscle Tone: within normal limits Gait & Station: ataxic Patient leans: Right  Psychiatric Specialty Exam: HPI  ROS  There were no vitals taken for this visit.There is no weight on file to calculate BMI.  General Appearance: Meticulous  Eye Contact:  Good  Speech:  Clear and Coherent  Volume:  Normal  Mood:  NA  Affect:  Appropriate  Thought Process:  Coherent  Orientation:  Full (Time, Place, and Person)  Thought Content:  WDL  Suicidal Thoughts:  No  Homicidal Thoughts:  No  Memory:  NA  Judgement:  Good  Insight:  Good  Psychomotor Activity:  Normal  Concentration:  Good  Recall:  Good  Fund of Knowledge:Good  Language: Good  Akathisia:  No  Handed:  Right  AIMS (if indicated):   Assets:  Desire for Improvement  ADL's:  Intact  Cognition: WNL  Sleep:  Normal    Is the patient at risk to self?  No. Has the patient been  a risk to self in the past 6 months?  No. Has the patient been a risk to self within the distant past?  No. Is the patient a risk to others?  No. Has the patient been a risk to others in the past 6 months?  No. Has the patient been a risk to others within the distant past?  No.  Allergies:  No Known Allergies Current Medications: Current Outpatient Prescriptions  Medication Sig Dispense Refill  . albuterol (PROVENTIL HFA;VENTOLIN HFA) 108 (90 BASE) MCG/ACT inhaler Inhale 2 puffs into the lungs every 6 (six) hours as needed for wheezing or shortness of breath. 1 Inhaler 0  . ALPRAZolam (XANAX) 0.5 MG tablet Take 1 tablet (0.5 mg total) by mouth daily. 30 tablet 5  . aspirin 325 MG tablet  Take 325 mg by mouth daily at 10 pm.     . buPROPion (WELLBUTRIN SR) 100 MG 12 hr tablet TAKE 1 TABLET IN THE MORNING. 30 tablet 3  . clobetasol cream (TEMOVATE) 0.05 % Use as directed 30 g 3  . cyclobenzaprine (FLEXERIL) 10 MG tablet Take 1 tablet (10 mg total) by mouth daily. 30 tablet 3  . hydrOXYzine (ATARAX/VISTARIL) 10 MG tablet TAKE 1 TO 3 TABLETS EVERY 4 TO 6 HOURS AS NEEDED FOR ITCHING. *MAY CAUSE DROWSINESS* 60 tablet 3  . imipramine (TOFRANIL) 10 MG tablet Take 1 tablet (10 mg total) by mouth at bedtime. 30 tablet 5  . levothyroxine (SYNTHROID, LEVOTHROID) 88 MCG tablet TAKE 1 TABLET ONCE DAILY. 30 tablet 5  . sildenafil (REVATIO) 20 MG tablet Take 1 tablet (20 mg total) by mouth as needed. 30 tablet 1  . tadalafil (CIALIS) 5 MG tablet Take 1 tablet (5 mg total) by mouth daily as needed for erectile dysfunction. 30 tablet 0  . tamsulosin (FLOMAX) 0.4 MG CAPS capsule TAKE (1) CAPSULE DAILY. (Patient taking differently: TAKE (2) CAPSULES  DAILY.) 30 capsule 3  . venlafaxine XR (EFFEXOR-XR) 150 MG 24 hr capsule Take 1 capsule (150 mg total) by mouth daily with breakfast. 30 capsule 5  . venlafaxine XR (EFFEXOR-XR) 75 MG 24 hr capsule TAKE 1 CAPSULE ONCE DAILY WITH BREAKFAST. 30 capsule 2   No current  facility-administered medications for this visit.    Previous Psychotropic Medication Wellbutrin 100 mg slow release, Xanax 0.5 mg daily, imipramine 10 mg daily at bedtime, Effexor 37.5 mg and 75 mg.  Substance Abuse History in the last 12 months:  No.  Consequences of Substance Abuse: Negative  Medical Decision Making:  New problem, with additional work up planned  Treatment Plan Summary: At this time the patient will increase his Effexor and take it together as one pill. Will go up to a 150 mg X are pill to be taken one in the morning. Patient will continue taking Xanax 0.5 mg daily, Wellbutrin 100 g slow release in the morning and imipramine 10 mg at night. The patient continue melatonin. At this time the patient is not in psychotherapy. This patient to return to see me in 7 weeks. This patient's major problem is therefore persistent depression. I think is residual from his major depression. At this time our response will be to adjust up his Effexor. His second problem is that of erectile dysfunction. I recommended that he discuss this with Dr. Ronnie Derby primary care physician. His third problem would be that of feeling imbalanced. I do not believe this is related to his medications and again I referred him to speak to Dr. Dwyane Dee about problems of balance. The patient is noted that he's had falls in the past year.    Haskel Schroeder 6/22/20162:16 PM

## 2015-02-15 ENCOUNTER — Telehealth (HOSPITAL_COMMUNITY): Payer: Self-pay

## 2015-02-15 NOTE — Telephone Encounter (Signed)
Telephone call from patient to verify he was to continue his Wellbutrin 100mg  a day along with his increased Effexor XR to 150mg .  Reviewed Dr. Karen Chafe notes from 02/13/15 evaluation and verified for patient this was to be continued.  Also verified for patient medications were e-scribed in and his Xanax new order was called in that date as patient left without prescription per approval and request by Dr. Casimiro Needle that date.

## 2015-03-21 DIAGNOSIS — H52223 Regular astigmatism, bilateral: Secondary | ICD-10-CM | POA: Diagnosis not present

## 2015-03-21 DIAGNOSIS — H5212 Myopia, left eye: Secondary | ICD-10-CM | POA: Diagnosis not present

## 2015-03-21 DIAGNOSIS — H25813 Combined forms of age-related cataract, bilateral: Secondary | ICD-10-CM | POA: Diagnosis not present

## 2015-03-21 DIAGNOSIS — H04129 Dry eye syndrome of unspecified lacrimal gland: Secondary | ICD-10-CM | POA: Diagnosis not present

## 2015-03-21 DIAGNOSIS — H5201 Hypermetropia, right eye: Secondary | ICD-10-CM | POA: Diagnosis not present

## 2015-03-29 ENCOUNTER — Other Ambulatory Visit: Payer: Self-pay | Admitting: Endocrinology

## 2015-04-11 ENCOUNTER — Telehealth (HOSPITAL_COMMUNITY): Payer: Self-pay

## 2015-04-11 NOTE — Telephone Encounter (Signed)
Telephone call with patient who reports he was already supposed to be getting Imipramine 2 qhs and will discuss with Dr. Casimiro Needle at appointment scheduled for 04/12/15.

## 2015-04-11 NOTE — Telephone Encounter (Signed)
Medication fax received stating patient has been taking Imipramine at 10mg , 2 a day instead of prescribed one a day and would like an order to continue taking this way.  Patient is scheduled to see Dr. Casimiro Needle on 04/12/15 and can discuss at appointment.

## 2015-04-12 ENCOUNTER — Ambulatory Visit (INDEPENDENT_AMBULATORY_CARE_PROVIDER_SITE_OTHER): Payer: Medicare Other | Admitting: Psychiatry

## 2015-04-12 VITALS — BP 154/80 | HR 82 | Ht 68.0 in | Wt 196.4 lb

## 2015-04-12 DIAGNOSIS — F3340 Major depressive disorder, recurrent, in remission, unspecified: Secondary | ICD-10-CM | POA: Diagnosis not present

## 2015-04-12 DIAGNOSIS — F331 Major depressive disorder, recurrent, moderate: Secondary | ICD-10-CM

## 2015-04-12 DIAGNOSIS — F3342 Major depressive disorder, recurrent, in full remission: Secondary | ICD-10-CM

## 2015-04-12 MED ORDER — VENLAFAXINE HCL ER 150 MG PO CP24: 150.0000 mg | ORAL_CAPSULE | Freq: Every day | ORAL | Status: DC

## 2015-04-12 MED ORDER — IMIPRAMINE HCL 10 MG PO TABS: ORAL_TABLET | ORAL | Status: DC

## 2015-04-12 MED ORDER — ALPRAZOLAM 0.5 MG PO TABS: ORAL_TABLET | ORAL | Status: DC

## 2015-04-12 NOTE — Progress Notes (Signed)
[facility] MD Progress Note  04/12/2015 11:33 AM Benjamin Hahn  MRN:  [id number] Subjective:  Feeling good Principal Problem: Major depression, recurrent, remission Diagnosis: Major depression, recurrent, remission   Patient Active Problem List   Diagnosis Date Noted  . Temporal arteritis [M31.6] 06/16/2013  . Amaurosis fugax [G45.3] 06/01/2013  . Unspecified hypothyroidism [E03.9] 03/14/2013  . Other and unspecified hyperlipidemia [E78.5] 03/14/2013  . BPH (benign prostatic hypertrophy) [N40.0] 03/14/2013  . Hypogonadism, male [E29.1] 03/14/2013  . Erectile dysfunction [N52.9] 03/14/2013   Total Time spent with patient: 30 minutes   Past Medical History:  Past Medical History  Diagnosis Date  . Hypothyroid   . Depression 1990s  . Shingles May 2014    "Right face, still has some"  . Hypercholesteremia     controled  . Anxiety   . Transient blindness of both eyes   . GERD (gastroesophageal reflux disease)     occasional  . Arthritis     might be in back, no problems  . Temporal arteritis   . BPH (benign prostatic hyperplasia)     Past Surgical History  Procedure Laterality Date  . None    . No past surgeries    . Artery biopsy Right 06/20/2013    Procedure: BIOPSY TEMPORAL ARTERY RIGHT;  Surgeon: Earnstine Regal, MD;  Location: [facility];  Service: General;  Laterality: Right;   Family History:  Family History  Problem Relation Age of Onset  . Stroke Father     Died, 80s  . Stroke Sister     Living, 78  . Heart disease Mother     Died, 53  . Healthy Daughter   . Diabetes Maternal Grandmother   . Hypertension Neg Hx    Social History:  History  Alcohol Use  . 1.2 oz/week  . 2 Shots of liquor per week    Comment: socially     History  Drug Use No    Social History   Social History  . Marital Status: Married    Spouse Name: N/A  . Number of Children: N/A  . Years of Education: N/A   Social History Main Topics  . Smoking status: Former Smoker    Types:  Cigarettes    Quit date: 08/24/1966  . Smokeless tobacco: Never Used  . Alcohol Use: 1.2 oz/week    2 Shots of liquor per week     Comment: socially  . Drug Use: No  . Sexual Activity: Not on file   Other Topics Concern  . Not on file   Social History Narrative   Currently works as a Midwife.  Lives with wife and they have two healthy daughters.   Additional History:    Sleep: Good  Appetite:  Good   Assessment: At this time the patient is doing that her. He said the increase of Effexor has made a big difference. He says he feels terrific from it. He still has some ongoing issues of anxiety but they're tightly related to his financial/business problems. He denies daily depression. He still has some problems with balance but has not fallen. His primary care doctor added Flexeril that seemed to help him sleep at night. He is having some mild increased anxiety but this is minimal. The patient's family is doing well. The patient goes to the gym fairly regularly. He's got good energy. He can concentrate without problems. Overall he is doing quite well. He denies chest pain or shortness of breath. He denies any  neurological symptoms at this time.  Musculoskeletal: Strength & Muscle Tone: within normal limits Gait & Station: normal Patient leans: Right   Psychiatric Specialty Exam: Physical Exam  ROS  Blood pressure 154/80, pulse 82, height 5\' 8"  (1.727 m), weight 196 lb 6.4 oz (89.086 kg).Body mass index is 29.87 kg/(m^2).  General Appearance: Casual  Eye Contact::  Good  Speech:  Clear and Coherent  Volume:  Normal  Mood:  NA  Affect:  Appropriate  Thought Process:  Coherent  Orientation:  Full (Time, Place, and Person)  Thought Content:  NA  Suicidal Thoughts:  No  Homicidal Thoughts:  No  Memory:  NA  Judgement:  Good  Insight:  Good  Psychomotor Activity:  Normal  Concentration:  Good  Recall:  Good  Fund of Knowledge:Good  Language: Good  Akathisia:  No   Handed:  Right  AIMS (if indicated):     Assets:  Communication Skills  ADL's:  Intact  Cognition: WNL  Sleep:        Current Medications: Current Outpatient Prescriptions  Medication Sig Dispense Refill  . albuterol (PROVENTIL HFA;VENTOLIN HFA) 108 (90 BASE) MCG/ACT inhaler Inhale 2 puffs into the lungs every 6 (six) hours as needed for wheezing or shortness of breath. 1 Inhaler 0  . ALPRAZolam (XANAX) 0.5 MG tablet 1  Or  2  Prn  q day 60 tablet 5  . aspirin 325 MG tablet Take 325 mg by mouth daily at 10 pm.     . buPROPion (WELLBUTRIN SR) 100 MG 12 hr tablet TAKE 1 TABLET IN THE MORNING. 30 tablet 3  . clobetasol cream (TEMOVATE) 0.05 % Use as directed 30 g 3  . cyclobenzaprine (FLEXERIL) 10 MG tablet Take 1 tablet (10 mg total) by mouth daily. 30 tablet 3  . hydrOXYzine (ATARAX/VISTARIL) 10 MG tablet TAKE 1 TO 3 TABLETS EVERY 4 TO 6 HOURS AS NEEDED FOR ITCHING. *MAY CAUSE DROWSINESS* 60 tablet 3  . imipramine (TOFRANIL) 10 MG tablet 2  qhs 60 tablet 5  . levothyroxine (SYNTHROID, LEVOTHROID) 88 MCG tablet TAKE 1 TABLET ONCE DAILY. 30 tablet 5  . sildenafil (REVATIO) 20 MG tablet Take 1 tablet (20 mg total) by mouth as needed. 30 tablet 1  . tadalafil (CIALIS) 5 MG tablet Take 1 tablet (5 mg total) by mouth daily as needed for erectile dysfunction. 30 tablet 0  . tamsulosin (FLOMAX) 0.4 MG CAPS capsule TAKE (1) CAPSULE DAILY. (Patient taking differently: TAKE (2) CAPSULES  DAILY.) 30 capsule 3  . venlafaxine XR (EFFEXOR-XR) 150 MG 24 hr capsule Take 1 capsule (150 mg total) by mouth daily with breakfast. 30 capsule 5   No current facility-administered medications for this visit.    Lab Results: No results found for this or any previous visit (from the past 48 hour(s)).  Physical Findings: AIMS:  , ,  ,  ,    CIWA:    COWS:     Treatment Plan Summary: At this time the patient will continue taking his medications prescribed. The increase of Effexor has been very helpful. We  will slightly increase the use of when necessary Xanax to 0.5 mg taking one or 2. We'll not increase it any further. We'll also clarify that actually he supposed to be taking imipramine 10 mg 2 a day. He'll continue taking low-dose Wellbutrin at 100 mg slow release. Today we discussed past therapy which she's never been in. He's had some supportive therapy from his previous psychiatrist but no meaningful  psychotherapy. Nonetheless I think this patient is functioning very well. He is not suicidal he is in good spirits and he does have some transient anxiety where he gets very sad and uncomfortable. To take an extra Xanax now and then is reasonable. This patient to return to see me in 3 months. His first and main problem therefore is clinical depression which is well controlled on a better dose of Effexor and imipramine. His second problem is that of anxiety which will be dealt best by taking Xanax once a day and 1 extra if needed.   Medical Decision Making:  Self-Limited or Minor (1)     Benjamin Hahn 04/12/2015, 11:33 AM

## 2015-04-25 ENCOUNTER — Other Ambulatory Visit: Payer: Self-pay | Admitting: Endocrinology

## 2015-04-25 NOTE — Telephone Encounter (Signed)
Patient is requesting a refill of Flexeril, please advise if okay to send?

## 2015-04-30 ENCOUNTER — Ambulatory Visit: Payer: BLUE CROSS/BLUE SHIELD | Admitting: Endocrinology

## 2015-04-30 ENCOUNTER — Ambulatory Visit (INDEPENDENT_AMBULATORY_CARE_PROVIDER_SITE_OTHER): Payer: Medicare Other | Admitting: Endocrinology

## 2015-04-30 ENCOUNTER — Encounter: Payer: Self-pay | Admitting: Endocrinology

## 2015-04-30 VITALS — BP 120/72 | HR 80 | Temp 97.7°F | Resp 16 | Ht 68.0 in | Wt 194.4 lb

## 2015-04-30 DIAGNOSIS — E038 Other specified hypothyroidism: Secondary | ICD-10-CM

## 2015-04-30 DIAGNOSIS — E7800 Pure hypercholesterolemia, unspecified: Secondary | ICD-10-CM

## 2015-04-30 DIAGNOSIS — D649 Anemia, unspecified: Secondary | ICD-10-CM

## 2015-04-30 DIAGNOSIS — M6281 Muscle weakness (generalized): Secondary | ICD-10-CM | POA: Diagnosis not present

## 2015-04-30 DIAGNOSIS — E063 Autoimmune thyroiditis: Secondary | ICD-10-CM

## 2015-04-30 DIAGNOSIS — E785 Hyperlipidemia, unspecified: Secondary | ICD-10-CM

## 2015-04-30 LAB — COMPREHENSIVE METABOLIC PANEL
ALBUMIN: 3.9 g/dL (ref 3.5–5.2)
ALT: 15 U/L (ref 0–53)
AST: 17 U/L (ref 0–37)
Alkaline Phosphatase: 48 U/L (ref 39–117)
BUN: 20 mg/dL (ref 6–23)
CHLORIDE: 105 meq/L (ref 96–112)
CO2: 30 mEq/L (ref 19–32)
Calcium: 9.3 mg/dL (ref 8.4–10.5)
Creatinine, Ser: 1.14 mg/dL (ref 0.40–1.50)
GFR: 65.65 mL/min (ref >60.00)
Glucose, Bld: 50 mg/dL — ABNORMAL LOW (ref 70–99)
POTASSIUM: 4 meq/L (ref 3.5–5.1)
SODIUM: 141 meq/L (ref 135–145)
Total Bilirubin: 0.3 mg/dL (ref 0.2–1.2)
Total Protein: 6.6 g/dL (ref 6.0–8.3)

## 2015-04-30 LAB — LIPID PANEL
CHOLESTEROL: 242 mg/dL — AB (ref 0–200)
HDL: 65 mg/dL (ref >39.00)
LDL CALC: 150 mg/dL — AB (ref 0–99)
NonHDL: 176.76
TRIGLYCERIDES: 135 mg/dL (ref 0.0–149.0)
Total CHOL/HDL Ratio: 4
VLDL: 27 mg/dL (ref 0.0–40.0)

## 2015-04-30 LAB — CBC
HEMATOCRIT: 37.6 % — AB (ref 39.0–52.0)
HEMOGLOBIN: 12.6 g/dL — AB (ref 13.0–17.0)
MCHC: 33.5 g/dL (ref 30.0–36.0)
MCV: 94.9 fl (ref 78.0–100.0)
PLATELETS: 235 10*3/uL (ref 150.0–400.0)
RBC: 3.96 Mil/uL — ABNORMAL LOW (ref 4.22–5.81)
RDW: 14 % (ref 11.5–15.5)
WBC: 7.1 10*3/uL (ref 4.0–10.5)

## 2015-04-30 LAB — T4, FREE: Free T4: 0.79 ng/dL (ref 0.60–1.60)

## 2015-04-30 LAB — TSH: TSH: 1.37 u[IU]/mL (ref 0.35–4.50)

## 2015-04-30 NOTE — Progress Notes (Signed)
Quick Note:  Please let patient know that the cholesterol is higher, recommend trying pravastatin 20 mg, needs Rx Also need to add on iron/TIBC for diagnosis of anemia  ______

## 2015-04-30 NOTE — Progress Notes (Signed)
Subjective:      Patient ID: Benjamin Hahn, male   DOB: Sep 06, 1932, 81 y.o.   MRN: [id number]  Chief complaint: Follow-up of various problems  HPI     LEG weakness: He is still having some difficulty going up stairs but he can do so with holding onto the banister.  However has started walking on treadmill couple of times a week and does not seem to be concerned.  He was told to stop his Flexeril previously but he still keeps taking this; has some difficulty getting out of bed in the morning with legs being slightly weaker than    He had been having increased depression since 2015 and Effexor was increased to 150 mg by his psychiatrist and he feels better Has also been taking Wellbutrin 100 mg in the morning.   Has been able to sleep well with his usual regimen of Xanax, imipramine, melatonin and also Flexeril at times.  He will use a half tablet Xanax initially and take another half if needed later at night    Hypothyroidism: This is long-standing and he is usually on a stable dose.  When his TSH was low in 4/14  the dose was reduced to 88 mcg. Last lab results are as follows:  Lab Results  Component Value Date   FREET4 0.79 04/30/2015   FREET4 0.78 01/23/2015   FREET4 0.83 04/24/2014   TSH 1.37 04/30/2015   TSH 1.24 01/23/2015   TSH 3.19 08/09/2014      Weight gain: His weight has been stable.  He thinks he has gained weight and is asking for diet pill again.  Trying to get on the treadmill 2 or 3 times a week now   Wt Readings from Last 3 Encounters:  04/30/15 194 lb 6.4 oz (88.179 kg)  04/12/15 196 lb 6.4 oz (89.086 kg)  01/28/15 195 lb (88.451 kg)       Medication List       This list is accurate as of: 04/30/15  9:29 PM.  Always use your most recent med list.               ALPRAZolam 0.5 MG tablet  Commonly known as:  XANAX  1  Or  2  Prn  q day     aspirin 325 MG tablet  Take 325 mg by mouth daily at 10 pm.     buPROPion 100 MG 12 hr tablet   Commonly known as:  WELLBUTRIN SR  TAKE 1 TABLET IN THE MORNING.     clobetasol cream 0.05 %  Commonly known as:  TEMOVATE  Use as directed     cyclobenzaprine 10 MG tablet  Commonly known as:  FLEXERIL  TAKE 1 TABLET ONCE DAILY.     hydrOXYzine 10 MG tablet  Commonly known as:  ATARAX/VISTARIL  TAKE 1 TO 3 TABLETS EVERY 4 TO 6 HOURS AS NEEDED FOR ITCHING. *MAY CAUSE DROWSINESS*     imipramine 10 MG tablet  Commonly known as:  TOFRANIL  2  qhs     levothyroxine 88 MCG tablet  Commonly known as:  SYNTHROID, LEVOTHROID  TAKE 1 TABLET ONCE DAILY.     sildenafil 20 MG tablet  Commonly known as:  REVATIO  Take 1 tablet (20 mg total) by mouth as needed.     tadalafil 5 MG tablet  Commonly known as:  CIALIS  Take 1 tablet (5 mg total) by mouth daily as needed for erectile dysfunction.  tamsulosin 0.4 MG Caps capsule  Commonly known as:  FLOMAX  TAKE (1) CAPSULE DAILY.     venlafaxine XR 150 MG 24 hr capsule  Commonly known as:  EFFEXOR-XR  Take 1 capsule (150 mg total) by mouth daily with breakfast.        Review of Systems    Long-standing hypogonadism: He has been treated by his urologist.  With his urologist he has been treated with Testopel pellet and did not go back for his follow-up for 6 months and has not been given an appointment; urologist note states she was seen in December His last testosterone level was excellent   Lab Results  Component Value Date   TESTOSTERONE 433.39 03/16/2014    ERECTILE dysfunction: He is again asking for treatment despite having tried various regimens without success  Hypercholesterolemia: This has been long-standing and he has been taken off Lipitor because of potential connection with his leg weakness.  Recent LDL:  Lab Results  Component Value Date   CHOL 242* 04/30/2015   HDL 65.00 04/30/2015   LDLCALC 150* 04/30/2015   TRIG 135.0 04/30/2015   CHOLHDL 4 04/30/2015      BPH: He still takes 0.8 mg of Flomax from  his urologist     Objective:   Physical Exam BP 120/72 mmHg  Pulse 80  Temp(Src) 97.7 F (36.5 C)  Resp 16  Ht 5\' 8"  (1.727 m)  Wt 194 lb 6.4 oz (88.179 kg)  BMI 29.57 kg/m2  SpO2 94%      Assessment:      Leg weakness: He has mild proximal leg weakness.  Etiology is unclear and previous workup negative.  Does not appear to be cushingoid    Depression: Better controlled with higher dose of Effexor and he will continue to follow-up with psychiatrist  Hypercholesterolemia: He does not have other risk factors and LDL target is 130  Long-standing erectile dysfunction: He has not had any benefits from using Cialis and Viagra and discussed that he needs to follow-up with urologist for this  Hypogonadism: He has been overdue for his Testopel     Plan:      Increase regular exercise.  Reassured him that he does not need a weight loss medicine and this would not be desirable with his age and multiple other medical problems  He will avoid Flexeril as it is possibly causing some  of the leg weakness especially overnight  Lipids to be checked today  Schedule follow-up with urologist, he is overdue for his Testopel     Cadince Hilscher      Addendum: Mild anemia, will add iron panel LDL increase further at 150, recommend trying pravastatin 20 mg

## 2015-04-30 NOTE — Patient Instructions (Addendum)
Stop Flexeril   See Urologist

## 2015-05-01 ENCOUNTER — Other Ambulatory Visit (INDEPENDENT_AMBULATORY_CARE_PROVIDER_SITE_OTHER): Payer: Medicare Other

## 2015-05-01 ENCOUNTER — Other Ambulatory Visit: Payer: Self-pay | Admitting: *Deleted

## 2015-05-01 DIAGNOSIS — D649 Anemia, unspecified: Secondary | ICD-10-CM

## 2015-05-01 LAB — IBC PANEL
Iron: 88 ug/dL (ref 42–165)
Saturation Ratios: 29.2 % (ref 20.0–50.0)
Transferrin: 215 mg/dL (ref 212.0–360.0)

## 2015-05-01 MED ORDER — PRAVASTATIN SODIUM 20 MG PO TABS: 20.0000 mg | ORAL_TABLET | Freq: Every day | ORAL | Status: DC

## 2015-05-02 ENCOUNTER — Ambulatory Visit: Payer: BLUE CROSS/BLUE SHIELD | Admitting: Endocrinology

## 2015-05-22 ENCOUNTER — Ambulatory Visit: Payer: BLUE CROSS/BLUE SHIELD | Admitting: Endocrinology

## 2015-06-07 ENCOUNTER — Other Ambulatory Visit: Payer: Self-pay | Admitting: Endocrinology

## 2015-06-21 ENCOUNTER — Other Ambulatory Visit (HOSPITAL_COMMUNITY): Payer: Self-pay | Admitting: Psychiatry

## 2015-07-12 DIAGNOSIS — E291 Testicular hypofunction: Secondary | ICD-10-CM | POA: Diagnosis not present

## 2015-07-21 ENCOUNTER — Ambulatory Visit (INDEPENDENT_AMBULATORY_CARE_PROVIDER_SITE_OTHER): Payer: Medicare Other

## 2015-07-21 ENCOUNTER — Ambulatory Visit (INDEPENDENT_AMBULATORY_CARE_PROVIDER_SITE_OTHER): Payer: Medicare Other | Admitting: Emergency Medicine

## 2015-07-21 VITALS — BP 140/78 | HR 84 | Temp 98.1°F | Resp 14 | Ht 68.0 in | Wt 208.0 lb

## 2015-07-21 DIAGNOSIS — N3090 Cystitis, unspecified without hematuria: Secondary | ICD-10-CM

## 2015-07-21 DIAGNOSIS — R531 Weakness: Secondary | ICD-10-CM

## 2015-07-21 LAB — POCT URINALYSIS DIP (MANUAL ENTRY)
BILIRUBIN UA: NEGATIVE
Bilirubin, UA: NEGATIVE
Glucose, UA: NEGATIVE
Nitrite, UA: NEGATIVE
PH UA: 5.5
PROTEIN UA: NEGATIVE
SPEC GRAV UA: 1.015
Urobilinogen, UA: 0.2

## 2015-07-21 LAB — POCT CBC
GRANULOCYTE PERCENT: 69.5 % (ref 37–80)
HEMATOCRIT: 39.2 % — AB (ref 43.5–53.7)
HEMOGLOBIN: 13 g/dL — AB (ref 14.1–18.1)
LYMPH, POC: 2 (ref 0.6–3.4)
MCH, POC: 30.9 pg (ref 27–31.2)
MCHC: 33.2 g/dL (ref 31.8–35.4)
MCV: 93.1 fL (ref 80–97)
MID (cbc): 0.8 (ref 0–0.9)
MPV: 6.6 fL (ref 0–99.8)
POC GRANULOCYTE: 6.5 (ref 2–6.9)
POC LYMPH PERCENT: 21.6 %L (ref 10–50)
POC MID %: 8.9 % (ref 0–12)
Platelet Count, POC: 232 10*3/uL (ref 142–424)
RBC: 4.21 M/uL — AB (ref 4.69–6.13)
RDW, POC: 14.3 %
WBC: 9.3 10*3/uL (ref 4.6–10.2)

## 2015-07-21 LAB — POC MICROSCOPIC URINALYSIS (UMFC): MUCUS RE: ABSENT

## 2015-07-21 MED ORDER — SULFAMETHOXAZOLE-TRIMETHOPRIM 800-160 MG PO TABS: 1.0000 | ORAL_TABLET | Freq: Two times a day (BID) | ORAL | Status: DC

## 2015-07-21 MED ORDER — PHENAZOPYRIDINE HCL 200 MG PO TABS: 200.0000 mg | ORAL_TABLET | Freq: Three times a day (TID) | ORAL | Status: DC | PRN

## 2015-07-21 NOTE — Patient Instructions (Signed)

## 2015-07-21 NOTE — Progress Notes (Signed)
Subjective:  Patient ID: Benjamin Hahn, male    DOB: 26-Sep-1932  Age: 81 y.o. MRN: [id number]  CC: Fall; Nasal Congestion; Cough; Hoarse; and Extremity Weakness   HPI Benjamin TREVIZO presents  following sliding out of bed twice last night he was too weak to get out of up off the floor unit back in bed. He was globally weak with all 4 extremities. Slurred speech difficulty expressing himself. No facial asymmetry no double or blurred vision he does have bilateral cataracts. 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 He denies any nausea vomiting stool change. He has no rash. No cough shortness of breath or wheezing. He has no chest pain tightness heaviness pressure no peripheral edema. He has a history of anemia and hypothyroidism and was seen by his family doctor for weakness in his lower extremities. He is chronically had problems with balance and has not done anything except physical therapy through his gym focusing on lower extremity strengthening and balance. He hasn't fallen and struck his head. Had no loss consciousness or other neurologic symptoms  History [name] has a past medical history of Hypothyroid; Depression (1990s); Shingles (May 2014); Hypercholesteremia; Anxiety; Transient blindness of both eyes; GERD (gastroesophageal reflux disease); Arthritis; Temporal arteritis (HCC); and BPH (benign prostatic hyperplasia).   He has past surgical history that includes None; No past surgeries; and Artery Biopsy (Right, 06/20/2013).   His  family history includes Diabetes in his maternal grandmother; Healthy in his daughter; Heart disease in his mother; Stroke in his father and sister. There is no history of Hypertension.  He   reports that he quit smoking about 48 years ago. His smoking use included Cigarettes. He has never used smokeless tobacco. He  reports that he drinks about 1.2 oz of alcohol per week. He reports that he does not use illicit drugs.  Outpatient Prescriptions Prior to Visit  Medication Sig Dispense Refill  . ALPRAZolam (XANAX) 0.5 MG tablet 1  Or  2  Prn  q day 60 tablet 5  . buPROPion (WELLBUTRIN SR) 100 MG 12 hr tablet TAKE 1 TABLET IN THE MORNING. 30 tablet 0  . clobetasol cream (TEMOVATE) 0.05 % Use as directed 30 g 3  . cyclobenzaprine (FLEXERIL) 10 MG tablet TAKE 1 TABLET ONCE DAILY. 30 tablet 3  . hydrOXYzine (ATARAX/VISTARIL) 10 MG tablet TAKE 1 TO 3 TABLETS EVERY 4 TO 6 HOURS AS NEEDED FOR ITCHING. *MAY CAUSE DROWSINESS* 60 tablet 3  . imipramine (TOFRANIL) 10 MG tablet 2  qhs 60 tablet 5  . levothyroxine (SYNTHROID, LEVOTHROID) 88 MCG tablet TAKE 1 TABLET ONCE DAILY. 30 tablet 5  . pravastatin (PRAVACHOL) 20 MG tablet Take 1 tablet (20 mg total) by mouth daily. 30 tablet 3  . tamsulosin (FLOMAX) 0.4 MG CAPS capsule TAKE (1) CAPSULE DAILY. (Patient taking differently: TAKE (2) CAPSULES  DAILY.) 30 capsule 3  . venlafaxine XR (EFFEXOR-XR) 150 MG 24 hr capsule Take 1 capsule (150 mg total) by mouth daily with breakfast. 30 capsule 5  . aspirin 325 MG tablet Take 325 mg by mouth daily at 10 pm.     . sildenafil (REVATIO) 20 MG tablet Take 1 tablet (20 mg total) by mouth as needed. (Patient not taking: Reported on 04/30/2015) 30 tablet 1  . tadalafil (CIALIS) 5 MG tablet Take 1 tablet (5 mg total)  by mouth daily as needed for erectile dysfunction. (Patient not taking: Reported on 04/30/2015) 30 tablet 0   No facility-administered medications prior to visit.    Social History   Social History  . Marital Status: Married    Spouse Name: N/A  . Number of Children: N/A  . Years of Education: N/A   Social History Main Topics  . Smoking status: Former Smoker    Types: Cigarettes    Quit date: 08/24/1966  . Smokeless tobacco: Never Used  . Alcohol Use: 1.2 oz/week    2 Shots of liquor per week     Comment: socially   . Drug Use: No  . Sexual Activity: Not Asked   Other Topics Concern  . None   Social History Narrative   Currently works as a Midwife.  Lives with wife and they have two healthy daughters.     Review of Systems  Constitutional: Negative for fever, chills and appetite change.  HENT: Negative for congestion, ear pain, postnasal drip, sinus pressure and sore throat.   Eyes: Negative for pain and redness.  Respiratory: Negative for cough, shortness of breath and wheezing.   Cardiovascular: Negative for leg swelling.  Gastrointestinal: Negative for nausea, vomiting, abdominal pain, diarrhea, constipation and blood in stool.  Endocrine: Negative for polyuria.  Genitourinary: Negative for dysuria, urgency, frequency and flank pain.  Musculoskeletal: Negative for gait problem.  Skin: Negative for rash.  Neurological: Positive for weakness. Negative for headaches.  Psychiatric/Behavioral: Negative for confusion and decreased concentration. The patient is not nervous/anxious.     Objective:  BP 140/78 mmHg  Pulse 84  Temp(Src) 98.1 F (36.7 C) (Oral)  Resp 14  Ht 5\' 8"  (1.727 m)  Wt 208 lb (94.348 kg)  BMI 31.63 kg/m2  SpO2 97%  Physical Exam  Constitutional: He is oriented to person, place, and time. He appears well-developed and well-nourished. No distress.  HENT:  Head: Normocephalic and atraumatic.  Right Ear: External ear normal.  Left Ear: External ear normal.  Nose: Nose normal.  Eyes: Conjunctivae and EOM are normal. Pupils are equal, round, and reactive to light. No scleral icterus.  Neck: Normal range of motion. Neck supple. No tracheal deviation present.  Cardiovascular: Normal rate, regular rhythm and normal heart sounds.   Pulmonary/Chest: Effort normal. No respiratory distress. He has no wheezes. He has no rales.  Abdominal: He exhibits no mass. There is no tenderness. There is no rebound and no guarding.  Musculoskeletal: He exhibits no edema.    Lymphadenopathy:    He has no cervical adenopathy.  Neurological: He is alert and oriented to person, place, and time. Abnormal muscle tone: Minimal weakness in grip strength and flexion of the elbow on the right. He is right-handed. Coordination (He had  1 in impairment in his Romberg) abnormal.  Skin: Skin is warm and dry. No rash noted.  Psychiatric: He has a normal mood and affect. His behavior is normal.      Assessment & Plan:   Christoph was seen today for fall, nasal congestion, cough, hoarse and extremity weakness.  Diagnoses and all orders for this visit:  Weakness -     POCT CBC -     Comprehensive metabolic panel -     Cancel: POCT urine pregnancy -     POCT Microscopic Urinalysis ([facility]) -     DG Chest 2 View; Future -     EKG 12-Lead -     POCT urinalysis dipstick   I  am having Mr. Whalley maintain his aspirin, tamsulosin, sildenafil, tadalafil, clobetasol cream, hydrOXYzine, levothyroxine, imipramine, ALPRAZolam, venlafaxine XR, pravastatin, cyclobenzaprine, and buPROPion.  No orders of the defined types were placed in this encounter.    Appropriate red flag conditions were discussed with the patient as well as actions that should be taken.  Patient expressed his understanding.  Follow-up: No Follow-up on file.   Results for orders placed or performed in visit on 07/21/15  POCT CBC  Result Value Ref Range   WBC 9.3 4.6 - 10.2 K/uL   Lymph, poc 2.0 0.6 - 3.4   POC LYMPH PERCENT 21.6 10 - 50 %L   MID (cbc) 0.8 0 - 0.9   POC MID % 8.9 0 - 12 %M   POC Granulocyte 6.5 2 - 6.9   Granulocyte percent 69.5 37 - 80 %G   RBC 4.21 (A) 4.69 - 6.13 M/uL   Hemoglobin 13.0 (A) 14.1 - 18.1 g/dL   HCT, POC 39.2 (A) 43.5 - 53.7 %   MCV 93.1 80 - 97 fL   MCH, POC 30.9 27 - 31.2 pg   MCHC 33.2 31.8 - 35.4 g/dL   RDW, POC 14.3 %   Platelet Count, POC 232 142 - 424 K/uL   MPV 6.6 0 - 99.8 fL  POCT Microscopic Urinalysis ([facility])  Result Value Ref Range   WBC,UR,HPF,POC  Moderate (A) None WBC/hpf   RBC,UR,HPF,POC Few (A) None RBC/hpf   Bacteria Few (A) None, Too numerous to count   Mucus Absent Absent   Epithelial Cells, UR Per Microscopy None None, Too numerous to count cells/hpf  POCT urinalysis dipstick  Result Value Ref Range   Color, UA yellow yellow   Clarity, UA clear clear   Glucose, UA negative negative   Bilirubin, UA negative negative   Ketones, POC UA negative negative   Spec Grav, UA 1.015    Blood, UA trace-lysed (A) negative   pH, UA 5.5    Protein Ur, POC negative negative   Urobilinogen, UA 0.2    Nitrite, UA Negative Negative   Leukocytes, UA small (1+) (A) Negative     [facility] reading (PRIMARY) by  Dr. Ouida Sills negative chest.   Roselee Culver, MD  [facility] was unremarkable

## 2015-07-22 LAB — COMPREHENSIVE METABOLIC PANEL
ALT: 11 U/L (ref 9–46)
AST: 17 U/L (ref 10–35)
Albumin: 4.1 g/dL (ref 3.6–5.1)
Alkaline Phosphatase: 56 U/L (ref 40–115)
BUN: 15 mg/dL (ref 7–25)
CHLORIDE: 105 mmol/L (ref 98–110)
CO2: 30 mmol/L (ref 20–31)
Calcium: 9.3 mg/dL (ref 8.6–10.3)
Creat: 1.02 mg/dL (ref 0.70–1.11)
GLUCOSE: 95 mg/dL (ref 65–99)
POTASSIUM: 4.9 mmol/L (ref 3.5–5.3)
Sodium: 143 mmol/L (ref 135–146)
TOTAL PROTEIN: 6.9 g/dL (ref 6.1–8.1)
Total Bilirubin: 0.5 mg/dL (ref 0.2–1.2)

## 2015-07-26 ENCOUNTER — Other Ambulatory Visit: Payer: Self-pay | Admitting: *Deleted

## 2015-07-26 ENCOUNTER — Encounter: Payer: Self-pay | Admitting: Endocrinology

## 2015-07-26 ENCOUNTER — Ambulatory Visit (INDEPENDENT_AMBULATORY_CARE_PROVIDER_SITE_OTHER): Payer: Medicare Other | Admitting: Endocrinology

## 2015-07-26 ENCOUNTER — Ambulatory Visit (HOSPITAL_COMMUNITY): Payer: Self-pay | Admitting: Psychiatry

## 2015-07-26 VITALS — BP 132/62 | HR 95 | Temp 98.8°F | Resp 16 | Ht 68.0 in | Wt 202.0 lb

## 2015-07-26 DIAGNOSIS — D649 Anemia, unspecified: Secondary | ICD-10-CM | POA: Diagnosis not present

## 2015-07-26 DIAGNOSIS — M6281 Muscle weakness (generalized): Secondary | ICD-10-CM | POA: Diagnosis not present

## 2015-07-26 DIAGNOSIS — E7800 Pure hypercholesterolemia, unspecified: Secondary | ICD-10-CM

## 2015-07-26 DIAGNOSIS — E559 Vitamin D deficiency, unspecified: Secondary | ICD-10-CM

## 2015-07-26 DIAGNOSIS — R296 Repeated falls: Secondary | ICD-10-CM

## 2015-07-26 MED ORDER — PREDNISONE 10 MG PO TABS: ORAL_TABLET | ORAL | Status: DC

## 2015-07-26 NOTE — Progress Notes (Signed)
Subjective:      Patient ID: Benjamin Hahn, male   DOB: Nov 08, 1932, 81 y.o.   MRN: [id number]  Chief complaint: Recent cough and follow-up of various problems  HPI    COUGH: He has had cough for the last 4-5 days and this is persistent, he does not like to take OTC cough because of fear of interaction with other drugs.  He said that most of his cough is dry and has some clear or gray sputum.  No fever.   Also recently he was seen in the urgent care center with symptoms of nonspecific weakness and was found to have a UTI and started on Bactrim.  He thinks his generalized weakness is better    LEG weakness: He is still having  difficulty going up stairs and some weakness and getting up from sitting.  Although he has tried to walk on the treadmill he does not feel any stronger and not he is also having difficulties with balance and falling fairly regularly.  He had been reluctant to go see the neurologist previously but he was not wanting to do this  He was told to stop his Flexeril previously but he still keeps taking this at bedtime to help him sleep; has some difficulty getting out of bed in the morning also.     He had been having increased depression better with Effexor which was increased to 150 mg by his psychiatrist  Has also been taking Wellbutrin 100 mg in the morning.  His family member wants to know if he can reduce his medications    Has been able to sleep well with his usual regimen of Xanax, imipramine, melatonin and also Flexeril at times.  He will use a half tablet Xanax initially and take another half if needed later at night    Hypothyroidism: This is long-standing and he is usually on a stable dose since 2014 Currently taking 88 mcg. Last lab results are as follows:  Lab Results  Component Value Date   FREET4 0.79 04/30/2015   FREET4 0.78 01/23/2015   FREET4 0.83 04/24/2014   TSH 1.37 04/30/2015   TSH 1.24 01/23/2015   TSH 3.19 08/09/2014      Weight  gain: His weight has been going up and has difficulty losing weight even though he is trying to do some walking on the treadmill   Wt Readings from Last 3 Encounters:  07/26/15 202 lb (91.627 kg)  07/21/15 208 lb (94.348 kg)  04/30/15 194 lb 6.4 oz (88.179 kg)       Medication List       This list is accurate as of: 07/26/15 11:59 PM.  Always use your most recent med list.               ALPRAZolam 0.5 MG tablet  Commonly known as:  XANAX  1  Or  2  Prn  q day     aspirin 325 MG tablet  Take 325 mg by mouth daily at 10 pm.     buPROPion 100 MG 12 hr tablet  Commonly known as:  WELLBUTRIN SR  TAKE 1 TABLET IN THE MORNING.     clobetasol cream 0.05 %  Commonly known as:  TEMOVATE  Use as directed     hydrOXYzine 10 MG tablet  Commonly known as:  ATARAX/VISTARIL  TAKE 1 TO 3 TABLETS EVERY 4 TO 6 HOURS AS NEEDED FOR ITCHING. *MAY CAUSE DROWSINESS*     imipramine 10 MG tablet  Commonly known as:  TOFRANIL  2  qhs     levothyroxine 88 MCG tablet  Commonly known as:  SYNTHROID, LEVOTHROID  TAKE 1 TABLET ONCE DAILY.     phenazopyridine 200 MG tablet  Commonly known as:  PYRIDIUM  Take 1 tablet (200 mg total) by mouth 3 (three) times daily as needed.     pravastatin 20 MG tablet  Commonly known as:  PRAVACHOL  Take 1 tablet (20 mg total) by mouth daily.     predniSONE 10 MG tablet  Commonly known as:  DELTASONE  Take 2 tablets daily for 2 days, then 1 tablet daily for 3 days.     sildenafil 20 MG tablet  Commonly known as:  REVATIO  Take 1 tablet (20 mg total) by mouth as needed.     sulfamethoxazole-trimethoprim 800-160 MG tablet  Commonly known as:  BACTRIM DS,SEPTRA DS  Take 1 tablet by mouth 2 (two) times daily.     tadalafil 5 MG tablet  Commonly known as:  CIALIS  Take 1 tablet (5 mg total) by mouth daily as needed for erectile dysfunction.     tamsulosin 0.4 MG Caps capsule  Commonly known as:  FLOMAX  TAKE (1) CAPSULE DAILY.     venlafaxine XR  150 MG 24 hr capsule  Commonly known as:  EFFEXOR-XR  Take 1 capsule (150 mg total) by mouth daily with breakfast.        Review of Systems    Long-standing hypogonadism: He has been treated by his urologist.  With his urologist he has been treated with Testopel pellet and did not go back for his follow-up for almost a year finally he made an appointment and had his pellet about a week ago His last testosterone level was excellent with regular treatment, not clear if he has had a follow-up done recently   Lab Results  Component Value Date   TESTOSTERONE 433.39 03/16/2014     Hypercholesterolemia: This has been long-standing and he has now been on pravastatin and started Lipitor because of possible connection with his leg weakness   Lab Results  Component Value Date   CHOL 242* 04/30/2015   HDL 65.00 04/30/2015   LDLCALC 150* 04/30/2015   TRIG 135.0 04/30/2015   CHOLHDL 4 04/30/2015      BPH: He still takes 0.8 mg of Flomax from his urologist     Objective:   Physical Exam BP 132/62 mmHg  Pulse 95  Temp(Src) 98.8 F (37.1 C)  Resp 16  Ht 5\' 8"  (1.727 m)  Wt 202 lb (91.627 kg)  BMI 30.72 kg/m2  SpO2 94%      Assessment:      Leg weakness: He has persistent proximal leg weakness.  Etiology is unclear and previous lab workup negative.  Recently may be somewhat worse with not being treated for hypogonadism  Balance difficulty and gait imbalance  Recent cough: Has upper respiratory infection which is somewhat persistent.  He thinks that he can only get better with taking prednisone as before   Hypercholesterolemia: He is on pravastatin 20 mg, will need follow-up levels, last LDL was 150  Depression and insomnia: Appears controlled  Hypogonadism: He has been seen by urologist recently and finally has got his testosterone pellet which had provided therapeutic levels       Plan:      He will take a short course of prednisone 10-20 mg for cough and can use  dose  Consultation with neurologist for  leg weakness and gait difficulty along with recent falls.  We will also check vitamin D level which he has not had  Stop Flexeril  Discuss potential for weight gain with Effexor with psychiatrist  Follow-up labs including thyroid on Monday, has had previously normal B12 levels     Krysta Bloomfield

## 2015-07-26 NOTE — Patient Instructions (Signed)
Leave off Flexeril

## 2015-08-02 ENCOUNTER — Telehealth: Payer: Self-pay | Admitting: Endocrinology

## 2015-08-02 ENCOUNTER — Other Ambulatory Visit (HOSPITAL_COMMUNITY): Payer: Self-pay | Admitting: Psychiatry

## 2015-08-02 ENCOUNTER — Other Ambulatory Visit: Payer: Self-pay | Admitting: *Deleted

## 2015-08-02 MED ORDER — PREDNISONE 10 MG PO TABS: ORAL_TABLET | ORAL | Status: DC

## 2015-08-02 NOTE — Telephone Encounter (Signed)
Noted, rx sent patient is aware 

## 2015-08-02 NOTE — Telephone Encounter (Signed)
Benjamin Hahn said the prednisone helped his cough, it does seem to be breaking up a little, but he still has the cough. He said he coughed all night last night and wants to know what he should do, he has finished the Prednisone. Please advise.

## 2015-08-02 NOTE — Telephone Encounter (Signed)
Refill same dose of prednisone

## 2015-08-02 NOTE — Telephone Encounter (Signed)
Patient would like for Suanne Marker to please call him as soon as she can   Call back: [phone]  Thank  You

## 2015-08-07 ENCOUNTER — Telehealth: Payer: Self-pay | Admitting: *Deleted

## 2015-08-07 NOTE — Telephone Encounter (Signed)
Noted, message left on his personal voice mail, I asked him to call me back tomorrow to let me know if he's coughing anything up of color.

## 2015-08-07 NOTE — Telephone Encounter (Signed)
Mr. Benjamin Hahn called back, he said he's going into the 3rd week of this cough, it's keeping him up all night. He wants to know if you could send in another z-pak for him?  Please advise.

## 2015-08-07 NOTE — Telephone Encounter (Signed)
Would recommend he get a chest x-ray tomorrow, no antibiotic unless he is having yellow sputum

## 2015-08-08 ENCOUNTER — Telehealth: Payer: Self-pay | Admitting: Endocrinology

## 2015-08-08 ENCOUNTER — Other Ambulatory Visit: Payer: Self-pay | Admitting: *Deleted

## 2015-08-08 MED ORDER — AZITHROMYCIN 250 MG PO TABS: ORAL_TABLET | ORAL | Status: DC

## 2015-08-08 NOTE — Telephone Encounter (Signed)
Patient's wife called stating that Benjamin Hahn cough has been getting worse and now there is green sputum  Could we call in a antibiotic for him?  Pharmacy: [facility]  Please advise   Thank you

## 2015-08-08 NOTE — Telephone Encounter (Signed)
Sent prescription for  Z-Pack

## 2015-08-08 NOTE — Telephone Encounter (Signed)
rx sent, patient is aware 

## 2015-08-08 NOTE — Telephone Encounter (Signed)
Mr. Ponzi wife called back and said he is coughing up Green sputum, would like an antibiotic.  Do you still want him to have a chest x-ray? Please advise.

## 2015-08-08 NOTE — Telephone Encounter (Signed)
Patient Name: Benjamin Hahn Gender: Male DOB: [birth year] Age: 81 Y 54 M 4 D Return Phone Number: [phone] (Primary) Address: City/State/Zip: [place] Client [facility] Night - Client Client Site [facility] Physician Kumar, [place] Type Call [place] Name [place] Phone Number [phone] Relationship To Patient Self Is this call to report lab results? No Call Type General Information Initial Comment Caller stated he is returning a phone call. Would like for the clinic to call him back in the morning. General Information Type Message Only Nurse Assessment Guidelines Guideline Title Affirmed Question Affirmed Notes Nurse Date/Time Eilene Ghazi

## 2015-08-09 ENCOUNTER — Ambulatory Visit (INDEPENDENT_AMBULATORY_CARE_PROVIDER_SITE_OTHER): Payer: Medicare Other | Admitting: Psychiatry

## 2015-08-09 VITALS — BP 138/68 | HR 86 | Ht 68.0 in | Wt 200.8 lb

## 2015-08-09 DIAGNOSIS — F3342 Major depressive disorder, recurrent, in full remission: Secondary | ICD-10-CM | POA: Diagnosis not present

## 2015-08-09 DIAGNOSIS — F331 Major depressive disorder, recurrent, moderate: Secondary | ICD-10-CM

## 2015-08-09 MED ORDER — ALPRAZOLAM 0.5 MG PO TABS: ORAL_TABLET | ORAL | Status: DC

## 2015-08-09 MED ORDER — VENLAFAXINE HCL ER 150 MG PO CP24: 150.0000 mg | ORAL_CAPSULE | Freq: Every day | ORAL | Status: DC

## 2015-08-09 MED ORDER — BUPROPION HCL ER (SR) 100 MG PO TB12
100.0000 mg | ORAL_TABLET | Freq: Every morning | ORAL | Status: DC
Start: 2015-08-09 — End: 2016-03-04

## 2015-08-09 NOTE — Progress Notes (Signed)
[facility] ([place]) MD Progress Note  08/09/2015 11:59 AM Benjamin Hahn  MRN:  [id number] Subjective:  Feels well Principal Problem: Major depression, chronic, mild/residual Diagnosis:  Major depression, recurrent residual Today the patient is doing fairly well. He's been having some medical problems. He recently went for Thanksgiving to a length but unfortunately had a urinary tract infection. He also since then has been coughing and recently started on antibiotic. Emotionally he is fairly stable. He denies daily depression. He stays very active at work. He's been unable to exercise. The patient is sleeping well and has an increase in his appetite. His energy is good. Is no problems thinking or concentrating. He is recently having a productive cough but is no shortness of breath. He denies any chest pain. Is a stable relationship with his wife. His kidneys are good. He has 4 grandchildren. Everybody in his home is good. He only issue is that a number mornings he awakens and he feels oversedated. It occurs point of his wife feels uncomfortable with him driving will taken to work. Today reviewed his medications and is evident that he likely needs reduction. He also notes that his memory has change in his and is good. 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 Patient Active Problem List   Diagnosis Date Noted  . Temporal arteritis ([place]) [M31.6] 06/16/2013  . Amaurosis fugax [G45.3] 06/01/2013  . Unspecified hypothyroidism [E03.9] 03/14/2013  . Other and unspecified hyperlipidemia [E78.5] 03/14/2013  . BPH (benign prostatic hypertrophy) [N40.0] 03/14/2013  . Hypogonadism, male [E29.1] 03/14/2013  . Erectile dysfunction [N52.9] 03/14/2013   Total Time spent with patient: 30 minutes  Past Psychiatric History:   Past Medical History:  Past Medical History  Diagnosis Date  . Hypothyroid   . Depression 1990s  . Shingles May 2014    "Right face, still has  some"  . Hypercholesteremia     controled  . Anxiety   . Transient blindness of both eyes   . GERD (gastroesophageal reflux disease)     occasional  . Arthritis     might be in back, no problems  . Temporal arteritis ([place])   . BPH (benign prostatic hyperplasia)     Past Surgical History  Procedure Laterality Date  . None    . No past surgeries    . Artery biopsy Right 06/20/2013    Procedure: BIOPSY TEMPORAL ARTERY RIGHT;  Surgeon: Earnstine Regal, MD;  Location: [facility];  Service: General;  Laterality: Right;   Family History:  Family History  Problem Relation Age of Onset  . Stroke Father     Died, 80s  . Stroke Sister     Living, 36  . Heart disease Mother     Died, 38  . Healthy Daughter   . Diabetes Maternal Grandmother   . Hypertension Neg Hx    Family Psychiatric  History:  Social History:  History  Alcohol Use  . 1.2 oz/week  . 2 Shots of liquor per week    Comment: socially     History  Drug Use No    Social History   Social History  . Marital Status: Married    Spouse Name: N/A  . Number of Children: N/A  . Years of Education: N/A   Social History Main Topics  . Smoking status: Former Smoker    Types: Cigarettes    Quit date: 08/24/1966  . Smokeless tobacco: Never Used  . Alcohol  Use: 1.2 oz/week    2 Shots of liquor per week     Comment: socially  . Drug Use: No  . Sexual Activity: Not on file   Other Topics Concern  . Not on file   Social History Narrative   Currently works as a Midwife.  Lives with wife and they have two healthy daughters.   Additional Social History:                         Sleep: Good  Appetite:  Good  Current Medications: Current Outpatient Prescriptions  Medication Sig Dispense Refill  . ALPRAZolam (XANAX) 0.5 MG tablet 1  Or  2  Prn  q day 30 tablet 5  . aspirin 325 MG tablet Take 325 mg by mouth daily at 10 pm.     . azithromycin (ZITHROMAX) 250 MG tablet Take 2 tablets the first day, then  1 tablet daily for the next 4 days. 6 tablet 0  . buPROPion (WELLBUTRIN SR) 100 MG 12 hr tablet Take 1 tablet (100 mg total) by mouth every morning. 30 tablet 5  . clobetasol cream (TEMOVATE) 0.05 % Use as directed 30 g 3  . hydrOXYzine (ATARAX/VISTARIL) 10 MG tablet TAKE 1 TO 3 TABLETS EVERY 4 TO 6 HOURS AS NEEDED FOR ITCHING. *MAY CAUSE DROWSINESS* 60 tablet 3  . levothyroxine (SYNTHROID, LEVOTHROID) 88 MCG tablet TAKE 1 TABLET ONCE DAILY. 30 tablet 5  . phenazopyridine (PYRIDIUM) 200 MG tablet Take 1 tablet (200 mg total) by mouth 3 (three) times daily as needed. 6 tablet 0  . pravastatin (PRAVACHOL) 20 MG tablet Take 1 tablet (20 mg total) by mouth daily. 30 tablet 3  . predniSONE (DELTASONE) 10 MG tablet Take 2 tablets daily for 2 days, then 1 tablet daily for 3 days. 7 tablet 0  . sildenafil (REVATIO) 20 MG tablet Take 1 tablet (20 mg total) by mouth as needed. 30 tablet 1  . sulfamethoxazole-trimethoprim (BACTRIM DS,SEPTRA DS) 800-160 MG tablet Take 1 tablet by mouth 2 (two) times daily. 20 tablet 0  . tadalafil (CIALIS) 5 MG tablet Take 1 tablet (5 mg total) by mouth daily as needed for erectile dysfunction. 30 tablet 0  . tamsulosin (FLOMAX) 0.4 MG CAPS capsule TAKE (1) CAPSULE DAILY. (Patient taking differently: TAKE (2) CAPSULES  DAILY.) 30 capsule 3  . venlafaxine XR (EFFEXOR-XR) 150 MG 24 hr capsule Take 1 capsule (150 mg total) by mouth daily with breakfast. 30 capsule 5   No current facility-administered medications for this visit.    Lab Results: No results found for this or any previous visit (from the past 48 hour(s)).  Physical Findings: AIMS:  , ,  ,  ,    CIWA:    COWS:     Musculoskeletal: Strength & Muscle Tone: within normal limits Gait & Station: normal Patient leans: Right  Psychiatric Specialty Exam: ROS  There were no vitals taken for this visit.There is no weight on file to calculate BMI.  General Appearance: Negative  Eye Contact::  Good  Speech:  Clear  and Coherent  Volume:  Normal  Mood:  NA  Affect:  Appropriate  Thought Process:  Coherent  Orientation:  Full (Time, Place, and Person)  Thought Content:  WDL  Suicidal Thoughts:  No  Homicidal Thoughts:  No  Memory:  NA  Judgement:  Good  Insight:  Good  Psychomotor Activity:  Normal  Concentration:  Good  Recall:  Good  Fund of Knowledge:Good  Language: Good  Akathisia:  No  Handed:  Right  AIMS (if indicated):     Assets:  Desire for Improvement  ADL's:  Intact  Cognition: WNL  Sleep:      Treatment Plan Summary: At this time the patient will continue taking the Effexor 150 mg X are in the morning. Over the year we have increased it from 55 and he felt as distinct improvement. He'll continue taking a low dose of Wellbutrin 100 mg slow release. Today our intervention was to discontinue his imipramine. He was taking 10 mg 2 at night. I don't think this is a good agent for this individual giving his age and the fact that he wakes up a bit sedated. I do not think it will affect him emotionally. The patient also takes Xanax 0.5 mg patch reasonable. Most the time he takes it at night and helps him sleep. So the combination of Effexor Wellbutrin and a little bit of Xanax seems to help this individual a great deal. Is not suicidal. He drinks a fair amount only about 2 drinks a night. Is not had any falls. Generally he likes to workout but this time he's been sick with with what sounds like an upper respiratory tract infection. He just recently got over a bladder infection. This patient to return to see me in 3 months and he will bring his wife at that time.  Benjamin Hahn, [place] 08/09/2015, 11:59 AM

## 2015-08-13 DIAGNOSIS — E291 Testicular hypofunction: Secondary | ICD-10-CM | POA: Diagnosis not present

## 2015-08-15 DIAGNOSIS — R300 Dysuria: Secondary | ICD-10-CM | POA: Diagnosis not present

## 2015-08-15 DIAGNOSIS — N401 Enlarged prostate with lower urinary tract symptoms: Secondary | ICD-10-CM | POA: Diagnosis not present

## 2015-08-15 DIAGNOSIS — R3915 Urgency of urination: Secondary | ICD-10-CM | POA: Diagnosis not present

## 2015-08-15 DIAGNOSIS — N390 Urinary tract infection, site not specified: Secondary | ICD-10-CM | POA: Diagnosis not present

## 2015-08-15 DIAGNOSIS — N138 Other obstructive and reflux uropathy: Secondary | ICD-10-CM | POA: Diagnosis not present

## 2015-08-26 ENCOUNTER — Other Ambulatory Visit (HOSPITAL_COMMUNITY): Payer: Self-pay | Admitting: Psychiatry

## 2015-08-29 ENCOUNTER — Telehealth: Payer: Self-pay | Admitting: Endocrinology

## 2015-08-29 NOTE — Telephone Encounter (Signed)
Patient is returning your call.  

## 2015-08-29 NOTE — Telephone Encounter (Signed)
I didn't call him.

## 2015-08-30 ENCOUNTER — Inpatient Hospital Stay (HOSPITAL_COMMUNITY)
Admission: EM | Admit: 2015-08-30 | Discharge: 2015-09-03 | DRG: 247 | Disposition: A | Discharge status: Home / Self Care | Payer: Medicare Other | Attending: Cardiology | Admitting: Cardiology

## 2015-08-30 ENCOUNTER — Encounter: Payer: Self-pay | Admitting: Endocrinology

## 2015-08-30 ENCOUNTER — Ambulatory Visit (INDEPENDENT_AMBULATORY_CARE_PROVIDER_SITE_OTHER): Payer: Medicare Other | Admitting: Endocrinology

## 2015-08-30 ENCOUNTER — Other Ambulatory Visit: Payer: Self-pay

## 2015-08-30 ENCOUNTER — Ambulatory Visit: Payer: Self-pay | Admitting: Endocrinology

## 2015-08-30 ENCOUNTER — Emergency Department (HOSPITAL_COMMUNITY): Payer: Medicare Other

## 2015-08-30 ENCOUNTER — Encounter (HOSPITAL_COMMUNITY): Payer: Self-pay | Admitting: *Deleted

## 2015-08-30 VITALS — BP 108/58 | HR 88 | Temp 98.4°F | Resp 14 | Ht 68.0 in | Wt 197.0 lb

## 2015-08-30 DIAGNOSIS — E785 Hyperlipidemia, unspecified: Secondary | ICD-10-CM | POA: Diagnosis present

## 2015-08-30 DIAGNOSIS — B029 Zoster without complications: Secondary | ICD-10-CM | POA: Diagnosis not present

## 2015-08-30 DIAGNOSIS — I2119 ST elevation (STEMI) myocardial infarction involving other coronary artery of inferior wall: Secondary | ICD-10-CM | POA: Diagnosis not present

## 2015-08-30 DIAGNOSIS — Z87891 Personal history of nicotine dependence: Secondary | ICD-10-CM

## 2015-08-30 DIAGNOSIS — Z7982 Long term (current) use of aspirin: Secondary | ICD-10-CM

## 2015-08-30 DIAGNOSIS — M1990 Unspecified osteoarthritis, unspecified site: Secondary | ICD-10-CM | POA: Diagnosis present

## 2015-08-30 DIAGNOSIS — R079 Chest pain, unspecified: Secondary | ICD-10-CM | POA: Diagnosis not present

## 2015-08-30 DIAGNOSIS — I249 Acute ischemic heart disease, unspecified: Secondary | ICD-10-CM | POA: Diagnosis not present

## 2015-08-30 DIAGNOSIS — E039 Hypothyroidism, unspecified: Secondary | ICD-10-CM | POA: Diagnosis present

## 2015-08-30 DIAGNOSIS — I252 Old myocardial infarction: Secondary | ICD-10-CM

## 2015-08-30 DIAGNOSIS — I10 Essential (primary) hypertension: Secondary | ICD-10-CM | POA: Diagnosis present

## 2015-08-30 DIAGNOSIS — R918 Other nonspecific abnormal finding of lung field: Secondary | ICD-10-CM

## 2015-08-30 DIAGNOSIS — E7800 Pure hypercholesterolemia, unspecified: Secondary | ICD-10-CM | POA: Diagnosis not present

## 2015-08-30 DIAGNOSIS — N400 Enlarged prostate without lower urinary tract symptoms: Secondary | ICD-10-CM | POA: Diagnosis present

## 2015-08-30 DIAGNOSIS — I25119 Atherosclerotic heart disease of native coronary artery with unspecified angina pectoris: Secondary | ICD-10-CM | POA: Diagnosis not present

## 2015-08-30 DIAGNOSIS — F17210 Nicotine dependence, cigarettes, uncomplicated: Secondary | ICD-10-CM | POA: Diagnosis not present

## 2015-08-30 DIAGNOSIS — J45909 Unspecified asthma, uncomplicated: Secondary | ICD-10-CM | POA: Diagnosis present

## 2015-08-30 DIAGNOSIS — F329 Major depressive disorder, single episode, unspecified: Secondary | ICD-10-CM | POA: Diagnosis present

## 2015-08-30 LAB — CBC WITH DIFFERENTIAL/PLATELET
Basophils Absolute: 0 10*3/uL (ref 0.0–0.1)
Basophils Relative: 1 %
EOS PCT: 3 %
Eosinophils Absolute: 0.2 10*3/uL (ref 0.0–0.7)
HCT: 39.3 % (ref 39.0–52.0)
Hemoglobin: 12.5 g/dL — ABNORMAL LOW (ref 13.0–17.0)
LYMPHS ABS: 2.3 10*3/uL (ref 0.7–4.0)
LYMPHS PCT: 33 %
MCH: 30.2 pg (ref 26.0–34.0)
MCHC: 31.8 g/dL (ref 30.0–36.0)
MCV: 94.9 fL (ref 78.0–100.0)
MONO ABS: 0.3 10*3/uL (ref 0.1–1.0)
MONOS PCT: 5 %
Neutro Abs: 4.1 10*3/uL (ref 1.7–7.7)
Neutrophils Relative %: 58 %
PLATELETS: 245 10*3/uL (ref 150–400)
RBC: 4.14 MIL/uL — ABNORMAL LOW (ref 4.22–5.81)
RDW: 14.8 % (ref 11.5–15.5)
WBC: 6.9 10*3/uL (ref 4.0–10.5)

## 2015-08-30 LAB — BASIC METABOLIC PANEL
Anion gap: 8 (ref 5–15)
BUN: 17 mg/dL (ref 6–20)
CHLORIDE: 105 mmol/L (ref 101–111)
CO2: 28 mmol/L (ref 22–32)
CREATININE: 1.23 mg/dL (ref 0.61–1.24)
Calcium: 8.8 mg/dL — ABNORMAL LOW (ref 8.9–10.3)
GFR calc Af Amer: >60 mL/min (ref >60)
GFR calc non Af Amer: 54 mL/min — ABNORMAL LOW (ref >60)
Glucose, Bld: 122 mg/dL — ABNORMAL HIGH (ref 65–99)
Potassium: 4 mmol/L (ref 3.5–5.1)
SODIUM: 141 mmol/L (ref 135–145)

## 2015-08-30 LAB — CBC
HCT: 40.3 % (ref 39.0–52.0)
Hemoglobin: 12.7 g/dL — ABNORMAL LOW (ref 13.0–17.0)
MCH: 30.4 pg (ref 26.0–34.0)
MCHC: 31.5 g/dL (ref 30.0–36.0)
MCV: 96.4 fL (ref 78.0–100.0)
PLATELETS: 255 10*3/uL (ref 150–400)
RBC: 4.18 MIL/uL — ABNORMAL LOW (ref 4.22–5.81)
RDW: 14.8 % (ref 11.5–15.5)
WBC: 6.3 10*3/uL (ref 4.0–10.5)

## 2015-08-30 LAB — COMPREHENSIVE METABOLIC PANEL
ALBUMIN: 3.3 g/dL — AB (ref 3.5–5.0)
ALK PHOS: 47 U/L (ref 38–126)
ALT: 12 U/L — AB (ref 17–63)
AST: 17 U/L (ref 15–41)
Anion gap: 10 (ref 5–15)
BILIRUBIN TOTAL: 0.4 mg/dL (ref 0.3–1.2)
BUN: 17 mg/dL (ref 6–20)
CALCIUM: 9 mg/dL (ref 8.9–10.3)
CO2: 29 mmol/L (ref 22–32)
CREATININE: 1.32 mg/dL — AB (ref 0.61–1.24)
Chloride: 102 mmol/L (ref 101–111)
GFR calc Af Amer: 57 mL/min — ABNORMAL LOW (ref >60)
GFR calc non Af Amer: 49 mL/min — ABNORMAL LOW (ref >60)
GLUCOSE: 126 mg/dL — AB (ref 65–99)
Potassium: 3.8 mmol/L (ref 3.5–5.1)
SODIUM: 141 mmol/L (ref 135–145)
TOTAL PROTEIN: 6.1 g/dL — AB (ref 6.5–8.1)

## 2015-08-30 LAB — MRSA PCR SCREENING: MRSA by PCR: NEGATIVE

## 2015-08-30 LAB — TROPONIN I: Troponin I: 0.11 ng/mL — ABNORMAL HIGH (ref <0.031)

## 2015-08-30 LAB — I-STAT TROPONIN, ED: TROPONIN I, POC: 0.11 ng/mL — AB (ref 0.00–0.08)

## 2015-08-30 LAB — TSH: TSH: 6.5 u[IU]/mL — ABNORMAL HIGH (ref 0.350–4.500)

## 2015-08-30 MED ORDER — CLOPIDOGREL BISULFATE 75 MG PO TABS
75.0000 mg | ORAL_TABLET | Freq: Every day | ORAL | Status: DC
  Administered 2015-08-31 – 2015-09-01 (×2): 75 mg via ORAL
  Filled 2015-08-30 (×2): qty 1

## 2015-08-30 MED ORDER — HEPARIN BOLUS VIA INFUSION
4000.0000 [IU] | Freq: Once | INTRAVENOUS | Status: AC
  Administered 2015-08-30: 4000 [IU] via INTRAVENOUS
  Filled 2015-08-30: qty 4000

## 2015-08-30 MED ORDER — ASPIRIN 300 MG RE SUPP: 300.0000 mg | RECTAL | Status: DC

## 2015-08-30 MED ORDER — METOPROLOL SUCCINATE ER 25 MG PO TB24
25.0000 mg | ORAL_TABLET | Freq: Every day | ORAL | Status: DC
Start: 2015-08-30 — End: 2016-03-06

## 2015-08-30 MED ORDER — ACETAMINOPHEN 325 MG PO TABS
650.0000 mg | ORAL_TABLET | ORAL | Status: DC | PRN
  Administered 2015-08-30 – 2015-09-02 (×3): 650 mg via ORAL
  Filled 2015-08-30 (×3): qty 2

## 2015-08-30 MED ORDER — ALPRAZOLAM 0.25 MG PO TABS
0.2500 mg | ORAL_TABLET | Freq: Two times a day (BID) | ORAL | Status: DC | PRN
  Administered 2015-08-30 – 2015-09-02 (×4): 0.25 mg via ORAL
  Filled 2015-08-30 (×4): qty 1

## 2015-08-30 MED ORDER — VENLAFAXINE HCL ER 150 MG PO CP24
150.0000 mg | ORAL_CAPSULE | Freq: Every day | ORAL | Status: DC
  Administered 2015-08-31 – 2015-09-03 (×4): 150 mg via ORAL
  Filled 2015-08-30 (×7): qty 1

## 2015-08-30 MED ORDER — CLOPIDOGREL BISULFATE 75 MG PO TABS
300.0000 mg | ORAL_TABLET | Freq: Once | ORAL | Status: AC
  Administered 2015-08-30: 300 mg via ORAL
  Filled 2015-08-30: qty 4

## 2015-08-30 MED ORDER — IMIPRAMINE HCL 10 MG PO TABS
10.0000 mg | ORAL_TABLET | Freq: Every day | ORAL | Status: DC
  Administered 2015-08-30 – 2015-09-02 (×4): 10 mg via ORAL
  Filled 2015-08-30 (×6): qty 1

## 2015-08-30 MED ORDER — ASPIRIN 81 MG PO CHEW: 324.0000 mg | CHEWABLE_TABLET | ORAL | Status: DC

## 2015-08-30 MED ORDER — ATORVASTATIN CALCIUM 40 MG PO TABS
40.0000 mg | ORAL_TABLET | Freq: Every day | ORAL | Status: DC
  Administered 2015-08-31 – 2015-09-02 (×3): 40 mg via ORAL
  Filled 2015-08-30 (×3): qty 1

## 2015-08-30 MED ORDER — MELATONIN 3 MG PO TABS
3.0000 mg | ORAL_TABLET | Freq: Every day | ORAL | Status: DC
  Administered 2015-08-30: 3 mg via ORAL
  Filled 2015-08-30 (×2): qty 1

## 2015-08-30 MED ORDER — SODIUM CHLORIDE 0.9 % IV SOLN
INTRAVENOUS | Status: DC
  Administered 2015-08-30 – 2015-09-01 (×4): via INTRAVENOUS
  Administered 2015-09-02: 250 mL via INTRAVENOUS

## 2015-08-30 MED ORDER — METOPROLOL TARTRATE 12.5 MG HALF TABLET
12.5000 mg | ORAL_TABLET | Freq: Two times a day (BID) | ORAL | Status: DC
  Administered 2015-08-30 – 2015-09-03 (×7): 12.5 mg via ORAL
  Filled 2015-08-30 (×8): qty 1

## 2015-08-30 MED ORDER — ASPIRIN EC 81 MG PO TBEC
81.0000 mg | DELAYED_RELEASE_TABLET | Freq: Every day | ORAL | Status: DC
  Administered 2015-08-31 – 2015-09-01 (×2): 81 mg via ORAL
  Filled 2015-08-30 (×2): qty 1

## 2015-08-30 MED ORDER — ONDANSETRON HCL 4 MG/2ML IJ SOLN
4.0000 mg | Freq: Four times a day (QID) | INTRAMUSCULAR | Status: DC | PRN
  Administered 2015-09-02: 4 mg via INTRAVENOUS
  Filled 2015-08-30: qty 2

## 2015-08-30 MED ORDER — LEVOTHYROXINE SODIUM 88 MCG PO TABS
88.0000 ug | ORAL_TABLET | Freq: Every day | ORAL | Status: DC
Start: 2015-08-31 — End: 2015-09-03
  Administered 2015-08-31 – 2015-09-03 (×4): 88 ug via ORAL
  Filled 2015-08-30 (×4): qty 1

## 2015-08-30 MED ORDER — NITROGLYCERIN IN D5W 200-5 MCG/ML-% IV SOLN
0.0000 ug/min | INTRAVENOUS | Status: DC
  Administered 2015-08-30: 5 ug/min via INTRAVENOUS
  Administered 2015-09-01 – 2015-09-02 (×2): 10 ug/min via INTRAVENOUS
  Filled 2015-08-30 (×2): qty 250

## 2015-08-30 MED ORDER — TAMSULOSIN HCL 0.4 MG PO CAPS
0.4000 mg | ORAL_CAPSULE | Freq: Every day | ORAL | Status: DC
  Administered 2015-08-31 – 2015-09-02 (×3): 0.4 mg via ORAL
  Filled 2015-08-30 (×3): qty 1

## 2015-08-30 MED ORDER — HEPARIN (PORCINE) IN NACL 100-0.45 UNIT/ML-% IJ SOLN
1550.0000 [IU]/h | INTRAMUSCULAR | Status: DC
  Administered 2015-08-30: 1200 [IU]/h via INTRAVENOUS
  Administered 2015-08-31 – 2015-09-02 (×3): 1550 [IU]/h via INTRAVENOUS
  Filled 2015-08-30 (×4): qty 250

## 2015-08-30 MED ORDER — BUPROPION HCL ER (SR) 100 MG PO TB12
100.0000 mg | ORAL_TABLET | Freq: Every morning | ORAL | Status: DC
  Administered 2015-08-31 – 2015-09-03 (×4): 100 mg via ORAL
  Filled 2015-08-30 (×4): qty 1

## 2015-08-30 MED ORDER — ASPIRIN 81 MG PO CHEW
324.0000 mg | CHEWABLE_TABLET | Freq: Once | ORAL | Status: AC
  Administered 2015-08-30: 324 mg via ORAL
  Filled 2015-08-30: qty 4

## 2015-08-30 MED ORDER — PANTOPRAZOLE SODIUM 40 MG PO TBEC
40.0000 mg | DELAYED_RELEASE_TABLET | Freq: Every day | ORAL | Status: DC
  Administered 2015-08-30 – 2015-09-03 (×4): 40 mg via ORAL
  Filled 2015-08-30 (×4): qty 1

## 2015-08-30 MED ORDER — NITROGLYCERIN 0.4 MG SL SUBL
0.4000 mg | SUBLINGUAL_TABLET | SUBLINGUAL | Status: DC | PRN
Start: 2015-08-30 — End: 2015-09-03
  Administered 2015-08-30: 0.4 mg via SUBLINGUAL
  Filled 2015-08-30: qty 1

## 2015-08-30 NOTE — ED Notes (Signed)
Per family, they were sent here due to pt having cp since last night. Went to dr office and sent here for abnormal EKG. Appears in no acute distress at triage. Airway intact.

## 2015-08-30 NOTE — Progress Notes (Signed)
ANTICOAGULATION CONSULT NOTE - Initial Consult  Pharmacy Consult for Heparin Indication: chest pain/ACS  No Known Allergies  Patient Measurements:   Heparin Dosing Weight: 87 kg  Vital Signs: Temp: 98.8 F (37.1 C) (01/06 1534) Temp Source: Oral (01/06 1534) BP: 121/74 mmHg (01/06 1600) Pulse Rate: 80 (01/06 1546)  Labs:  Recent Labs  08/30/15 1530  HGB 12.7*  HCT 40.3  PLT 255  CREATININE 1.23    Estimated Creatinine Clearance: 52 mL/min (by C-G formula based on Cr of 1.23).   Medical History: Past Medical History  Diagnosis Date  . Hypothyroid   . Depression 1990s  . Shingles May 2014    "Right face, still has some"  . Hypercholesteremia     controled  . Anxiety   . Transient blindness of both eyes   . GERD (gastroesophageal reflux disease)     occasional  . Arthritis     might be in back, no problems  . Temporal arteritis ([place])   . BPH (benign prostatic hyperplasia)     Medications:   (Not in a hospital admission) Scheduled:   Infusions:    Assessment: 82yo male presents with CP. Pharmacy is consulted to dose heparin for ACS/chest pain.  Goal of Therapy:  Heparin level 0.3-0.7 units/ml Monitor platelets by anticoagulation protocol: Yes   Plan:  Give 4000 units bolus x 1 Start heparin infusion at 1200 units/hr Check anti-Xa level in 8 hours and daily while on heparin Continue to monitor H&H and platelets  Andrey Cota. Diona Foley, PharmD, BCPS Clinical Pharmacist Pager [phone] 08/30/2015,4:40 PM

## 2015-08-30 NOTE — ED Notes (Signed)
Pt off unit with Xray 

## 2015-08-30 NOTE — Telephone Encounter (Signed)
error 

## 2015-08-30 NOTE — ED Notes (Signed)
Benjamin Hahn Pt Son and medical power of attorney Phone #[phone].  Call if needed.

## 2015-08-30 NOTE — H&P (Addendum)
Benjamin Hahn is an 82 y.o. male.   Chief Complaint:chest pain  WRU:Benjamin Hahn is 82 year old male with past medical history significant for hypertension, hype oh thyroidism, hyperlipidemia, depression, history of bronchial asthma,questionable history of temporal arteritis came to the ER from [place] office complaining of retrosternal chest pain described as heaviness grade 7/10 which started last night after working on his computer he took granules of aspirin with relief of chest pain this morning went to [facility] office had EKG done which showed Q waves in inferior leads with T-wave inversion in inferolateral leads and was referred to ED patient denies any chest pain at present patient was noted to have minimally elevated troponin I of 0.11. Patient was started on IV heparin in the ED. Patient denies such episodes of chest pain in the past. Denies any nausea vomiting diaphoresis. Denies shortness of breath. Denies palpitation lightheadedness or syncope.   Past Medical History  Diagnosis Date  . Hypothyroid   . Depression 1990s  . Shingles May 2014    "Right face, still has some"  . Hypercholesteremia     controled  . Anxiety   . Transient blindness of both eyes   . GERD (gastroesophageal reflux disease)     occasional  . Arthritis     might be in back, no problems  . Temporal arteritis ([place])   . BPH (benign prostatic hyperplasia)     Past Surgical History  Procedure Laterality Date  . None    . No past surgeries    . Artery biopsy Right 06/20/2013    Procedure: BIOPSY TEMPORAL ARTERY RIGHT;  Surgeon: Earnstine Regal, MD;  Location: [facility];  Service: General;  Laterality: Right;    Family History  Problem Relation Age of Onset  . Stroke Father     Died, 80s  . Stroke Sister     Living, 59  . Heart disease Mother     Died, 34  . Healthy Daughter   . Diabetes Maternal Grandmother   . Hypertension Neg Hx    Social History:  reports that he quit smoking about 49 years ago. His smoking  use included Cigarettes. He has never used smokeless tobacco. He reports that he drinks about 1.2 oz of alcohol per week. He reports that he does not use illicit drugs.  Allergies: No Known Allergies   (Not in a hospital admission)  Results for orders placed or performed during the hospital encounter of 08/30/15 (from the past 48 hour(s))  Basic metabolic panel     Status: Abnormal   Collection Time: 08/30/15  3:30 PM  Result Value Ref Range   Sodium 141 135 - 145 mmol/L   Potassium 4.0 3.5 - 5.1 mmol/L   Chloride 105 101 - 111 mmol/L   CO2 28 22 - 32 mmol/L   Glucose, Bld 122 (H) 65 - 99 mg/dL   BUN 17 6 - 20 mg/dL   Creatinine, Ser 1.23 0.61 - 1.24 mg/dL   Calcium 8.8 (L) 8.9 - 10.3 mg/dL   GFR calc non Af Amer 54 (L) >60 mL/min   GFR calc Af Amer >60 >60 mL/min    Comment: (NOTE) The eGFR has been calculated using the CKD EPI equation. This calculation has not been validated in all clinical situations. eGFR's persistently <60 mL/min signify possible Chronic Kidney Disease.    Anion gap 8 5 - 15  CBC     Status: Abnormal   Collection Time: 08/30/15  3:30 PM  Result Value Ref  Range   WBC 6.3 4.0 - 10.5 K/uL   RBC 4.18 (L) 4.22 - 5.81 MIL/uL   Hemoglobin 12.7 (L) 13.0 - 17.0 g/dL   HCT 40.3 39.0 - 52.0 %   MCV 96.4 78.0 - 100.0 fL   MCH 30.4 26.0 - 34.0 pg   MCHC 31.5 30.0 - 36.0 g/dL   RDW 14.8 11.5 - 15.5 %   Platelets 255 150 - 400 K/uL  I-stat troponin, ED     Status: Abnormal   Collection Time: 08/30/15  3:42 PM  Result Value Ref Range   Troponin i, poc 0.11 (HH) 0.00 - 0.08 ng/mL   Comment NOTIFIED PHYSICIAN    Comment 3            Comment: Due to the release kinetics of cTnI, a negative result within the first hours of the onset of symptoms does not rule out myocardial infarction with certainty. If myocardial infarction is still suspected, repeat the test at appropriate intervals.    Dg Chest 2 View  08/30/2015  CLINICAL DATA:  Patient with chest pain.   Smoker. EXAM: CHEST  2 VIEW COMPARISON:  Chest radiograph 07/21/2015. FINDINGS: Stable cardiac and mediastinal contours. Low lung volumes. Irregular opacity within the right mid lung. No large area of pulmonary consolidation, pleural effusion or pneumothorax. Mid thoracic spine degenerative changes. IMPRESSION: Irregular opacity within the right mid lung is nonspecific however underlying pulmonary malignancy is not excluded. Recommend correlation with chest CT. Electronically Signed   By: Lovey Newcomer M.D.   On: 08/30/2015 16:20    Review of Systems  Constitutional: Negative for chills.  Eyes: Negative for double vision and photophobia.  Respiratory: Negative for cough, hemoptysis, sputum production and shortness of breath.   Cardiovascular: Positive for chest pain. Negative for palpitations, orthopnea, claudication and leg swelling.  Gastrointestinal: Negative for nausea, vomiting, abdominal pain and diarrhea.  Genitourinary: Negative for dysuria.  Neurological: Negative for dizziness and headaches.    Blood pressure 128/77, pulse 78, temperature 98.4 F (36.9 C), temperature source Oral, resp. rate 16, SpO2 98 %. Physical Exam  Constitutional: He is oriented to person, place, and time.  HENT:  Head: Normocephalic and atraumatic.  Eyes: Conjunctivae are normal. Pupils are equal, round, and reactive to light. Left eye exhibits no discharge. No scleral icterus.  Neck: Normal range of motion. Neck supple. No JVD present. No tracheal deviation present. No thyromegaly present.  Cardiovascular: Normal rate and regular rhythm.   Murmur (Soft systolic murmur noted no S3 gallop) heard. Respiratory: Effort normal and breath sounds normal. No respiratory distress. He has no wheezes. He has no rales.  GI: Soft. Bowel sounds are normal. He exhibits no distension. There is no tenderness. There is no rebound.  Musculoskeletal: He exhibits no edema or tenderness.  Neurological: He is alert and oriented to  person, place, and time.     Assessment/Plan Acute coronary syndrome Possible old inferior wall MI, age undetermined  Hypertension Hyperlipidemia Hypothyroidism History of bronchial asthma Right midlung opacity rule out mass Depression History of questionable temporal arteritis in the past History of herpes in the past Plan As per orders Discussed with patient regarding left cardiac cath possible PTCA stenting its risk and benefits i.e. death MI stroke need for emergency CABG local vascular complications etc. and consents for PCI Check serial enzymes if recurrent chest pain and elevated cardiac enzymes or new EKG changes will schedule him for emergent PCI. Patient presently chest pain-free.      Benjamin Hahn,  Benjamin Hahn 08/30/2015, 7:11 PM

## 2015-08-30 NOTE — ED Provider Notes (Signed)
CSN: [id number]     Arrival date & time 08/30/15  1512 History   First MD Initiated Contact with Patient 08/30/15 1539     Chief Complaint  Patient presents with  . Chest Pain     (Consider location/radiation/quality/duration/timing/severity/associated sxs/prior Treatment) Patient is a 82 y.o. male presenting with chest pain. The history is provided by the patient.  Chest Pain Pain location:  Substernal area Pain quality: pressure   Pain radiates to:  Does not radiate Pain radiates to the back: no   Pain severity:  Moderate Onset quality:  Sudden Duration:  1 hour Timing:  Constant Progression:  Resolved Chronicity:  New Relieved by:  Aspirin and rest Worsened by:  Nothing tried Ineffective treatments:  None tried Associated symptoms: diaphoresis and nausea   Associated symptoms: no abdominal pain, no fever, no headache, no palpitations, no shortness of breath and not vomiting   Risk factors: male sex   Risk factors: no coronary artery disease, no diabetes mellitus, no high cholesterol, no hypertension and no smoking (remote smoking past)    82 yo M with a cc of chest pain. This started last night. Lasted about 30 minutes to an hour. Patient states blood pressure had some mild diaphoresis and nausea with it. Denied any radiation. Nothing seemed to make it worse patient took an aspirin and rested and it resolved. Went to see his primary physician today who is concerned about new flipped T waves in the inferior lateral leads. Sent here for further evaluation. No cardiac history. Remote smoking history. No family history. History of hypertension hyperlipidemia.    Past Medical History  Diagnosis Date  . Hypothyroid   . Depression 1990s  . Shingles May 2014    "Right face, still has some"  . Hypercholesteremia     controled  . Anxiety   . Transient blindness of both eyes   . GERD (gastroesophageal reflux disease)     occasional  . Arthritis     might be in back, no problems   . Temporal arteritis ([place])   . BPH (benign prostatic hyperplasia)    Past Surgical History  Procedure Laterality Date  . None    . No past surgeries    . Artery biopsy Right 06/20/2013    Procedure: BIOPSY TEMPORAL ARTERY RIGHT;  Surgeon: Earnstine Regal, MD;  Location: [facility];  Service: General;  Laterality: Right;   Family History  Problem Relation Age of Onset  . Stroke Father     Died, 80s  . Stroke Sister     Living, 56  . Heart disease Mother     Died, 30  . Healthy Daughter   . Diabetes Maternal Grandmother   . Hypertension Neg Hx    Social History  Substance Use Topics  . Smoking status: Former Smoker    Types: Cigarettes    Quit date: 08/24/1966  . Smokeless tobacco: Never Used  . Alcohol Use: 1.2 oz/week    2 Shots of liquor per week     Comment: socially    Review of Systems  Constitutional: Positive for diaphoresis. Negative for fever and chills.  HENT: Negative for congestion and facial swelling.   Eyes: Negative for discharge and visual disturbance.  Respiratory: Negative for shortness of breath.   Cardiovascular: Positive for chest pain. Negative for palpitations.  Gastrointestinal: Positive for nausea. Negative for vomiting, abdominal pain and diarrhea.  Musculoskeletal: Negative for myalgias and arthralgias.  Skin: Negative for color change and rash.  Neurological:  Negative for tremors, syncope and headaches.  Psychiatric/Behavioral: Negative for confusion and dysphoric mood.      Allergies  Review of patient's allergies indicates no known allergies.  Home Medications   Prior to Admission medications   Medication Sig Start Date End Date Taking? Authorizing Provider  ALPRAZolam Duanne Moron) 0.5 MG tablet 1  Or  2  Prn  q day 08/09/15   Norma Fredrickson, MD  aspirin 325 MG tablet Take 325 mg by mouth daily at 10 pm. Reported on 08/30/2015    Historical Provider, MD  buPROPion (WELLBUTRIN SR) 100 MG 12 hr tablet Take 1 tablet (100 mg total) by mouth every  morning. 08/09/15   Norma Fredrickson, MD  clobetasol cream (TEMOVATE) 0.05 % Use as directed 11/12/14   Elayne Snare, MD  hydrOXYzine (ATARAX/VISTARIL) 10 MG tablet TAKE 1 TO 3 TABLETS EVERY 4 TO 6 HOURS AS NEEDED FOR ITCHING. *MAY CAUSE DROWSINESS* 12/06/14   Elayne Snare, MD  levothyroxine (SYNTHROID, LEVOTHROID) 88 MCG tablet TAKE 1 TABLET ONCE DAILY. 03/29/15   Elayne Snare, MD  metoprolol succinate (TOPROL-XL) 25 MG 24 hr tablet Take 1 tablet (25 mg total) by mouth daily. 08/30/15   Elayne Snare, MD  phenazopyridine (PYRIDIUM) 200 MG tablet Take 1 tablet (200 mg total) by mouth 3 (three) times daily as needed. 07/21/15   Roselee Culver, MD  pravastatin (PRAVACHOL) 20 MG tablet Take 1 tablet (20 mg total) by mouth daily. 05/01/15   Elayne Snare, MD  sildenafil (REVATIO) 20 MG tablet Take 1 tablet (20 mg total) by mouth as needed. 04/27/14   Elayne Snare, MD  sulfamethoxazole-trimethoprim (BACTRIM DS,SEPTRA DS) 800-160 MG tablet Take 1 tablet by mouth 2 (two) times daily. 07/21/15   Roselee Culver, MD  tadalafil (CIALIS) 5 MG tablet Take 1 tablet (5 mg total) by mouth daily as needed for erectile dysfunction. 04/27/14   Elayne Snare, MD  tamsulosin (FLOMAX) 0.4 MG CAPS capsule TAKE (1) CAPSULE DAILY. Patient taking differently: TAKE (2) CAPSULES  DAILY. 04/17/14   Elayne Snare, MD  venlafaxine XR (EFFEXOR-XR) 150 MG 24 hr capsule Take 1 capsule (150 mg total) by mouth daily with breakfast. 08/09/15   Norma Fredrickson, MD   BP 121/74 mmHg  Pulse 80  Temp(Src) 98.8 F (37.1 C) (Oral)  Resp 15  SpO2 97% Physical Exam  Constitutional: He is oriented to person, place, and time. He appears well-developed and well-nourished.  HENT:  Head: Normocephalic and atraumatic.  Eyes: EOM are normal. Pupils are equal, round, and reactive to light.  Neck: Normal range of motion. Neck supple. No JVD present.  Cardiovascular: Normal rate, regular rhythm and intact distal pulses.  Exam reveals no gallop and no friction rub.   No  murmur heard. Pulmonary/Chest: No respiratory distress. He has no wheezes.  Abdominal: He exhibits no distension. There is no tenderness. There is no rebound and no guarding.  Musculoskeletal: Normal range of motion.  Neurological: He is alert and oriented to person, place, and time.  Skin: No rash noted. No pallor.  Psychiatric: He has a normal mood and affect. His behavior is normal.  Nursing note and vitals reviewed.   ED Course  Procedures (including critical care time) Labs Review Labs Reviewed  BASIC METABOLIC PANEL - Abnormal; Notable for the following:    Glucose, Bld 122 (*)    Calcium 8.8 (*)    GFR calc non Af Amer 54 (*)    All other components within normal limits  CBC - Abnormal;  Notable for the following:    RBC 4.18 (*)    Hemoglobin 12.7 (*)    All other components within normal limits  I-STAT TROPOININ, ED - Abnormal; Notable for the following:    Troponin i, poc 0.11 (*)    All other components within normal limits  HEPARIN LEVEL (UNFRACTIONATED)  CBC  I-STAT TROPOININ, ED    Imaging Review Dg Chest 2 View  08/30/2015  CLINICAL DATA:  Patient with chest pain.  Smoker. EXAM: CHEST  2 VIEW COMPARISON:  Chest radiograph 07/21/2015. FINDINGS: Stable cardiac and mediastinal contours. Low lung volumes. Irregular opacity within the right mid lung. No large area of pulmonary consolidation, pleural effusion or pneumothorax. Mid thoracic spine degenerative changes. IMPRESSION: Irregular opacity within the right mid lung is nonspecific however underlying pulmonary malignancy is not excluded. Recommend correlation with chest CT. Electronically Signed   By: Lovey Newcomer M.D.   On: 08/30/2015 16:20   I have personally reviewed and evaluated these images and lab results as part of my medical decision-making.   EKG Interpretation   Date/Time:  Friday August 30 2015 15:15:43 EST Ventricular Rate:  81 PR Interval:  186 QRS Duration: 90 QT Interval:  398 QTC Calculation:  462 R Axis:   -11 Text Interpretation:  Normal sinus rhythm Inferior infarct , age  undetermined T wave abnormality, consider anterolateral ischemia Abnormal  ECG Otherwise no significant change Confirmed by Dorismar Chay MD, Quillian Quince [id number])  on 08/30/2015 3:25:05 PM      MDM   Final diagnoses:  Acute coronary syndrome [facility])    82 yo M with new EKG changes with a positive troponin. Discussed the case with Dr. Terrence Dupont, will see patient, recommended heparin, nitro.   Cards will admit.  CRITICAL CARE Performed by: Cecilio Asper   Total critical care time: 40 minutes  Critical care time was exclusive of separately billable procedures and treating other patients.  Critical care was necessary to treat or prevent imminent or life-threatening deterioration.  Critical care was time spent personally by me on the following activities: development of treatment plan with patient and/or surrogate as well as nursing, discussions with consultants, evaluation of patient's response to treatment, examination of patient, obtaining history from patient or surrogate, ordering and performing treatments and interventions, ordering and review of laboratory studies, ordering and review of radiographic studies, pulse oximetry and re-evaluation of patient's condition.   The patients results and plan were reviewed and discussed.   Any x-rays performed were independently reviewed by myself.   Differential diagnosis were considered with the presenting HPI.  Medications  nitroGLYCERIN (NITROSTAT) SL tablet 0.4 mg (0.4 mg Sublingual Given 08/30/15 1929)  heparin ADULT infusion 100 units/mL (25000 units/250 mL) (1,550 Units/hr Intravenous New Bag/Given 08/31/15 0945)  buPROPion (WELLBUTRIN SR) 12 hr tablet 100 mg (100 mg Oral Given 08/31/15 0824)  levothyroxine (SYNTHROID, LEVOTHROID) tablet 88 mcg (88 mcg Oral Given 08/31/15 0632)  tamsulosin (FLOMAX) capsule 0.4 mg (not administered)  imipramine (TOFRANIL) tablet 10  mg (10 mg Oral Given 08/30/15 2257)  aspirin EC tablet 81 mg (81 mg Oral Given 08/31/15 0823)  acetaminophen (TYLENOL) tablet 650 mg (650 mg Oral Given 08/31/15 1438)  ondansetron (ZOFRAN) injection 4 mg (not administered)  0.9 %  sodium chloride infusion ( Intravenous New Bag/Given 08/31/15 0945)  clopidogrel (PLAVIX) tablet 75 mg (75 mg Oral Given 08/31/15 0824)  nitroGLYCERIN 50 mg in dextrose 5 % 250 mL (0.2 mg/mL) infusion (10 mcg/min Intravenous Rate/Dose Change 08/31/15 0023)  metoprolol  tartrate (LOPRESSOR) tablet 12.5 mg (12.5 mg Oral Given 08/31/15 0824)  atorvastatin (LIPITOR) tablet 40 mg (not administered)  pantoprazole (PROTONIX) EC tablet 40 mg (40 mg Oral Given 08/30/15 2326)  ALPRAZolam (XANAX) tablet 0.25 mg (0.25 mg Oral Given 08/30/15 2257)  venlafaxine XR (EFFEXOR-XR) 24 hr capsule 150 mg (150 mg Oral Given 08/31/15 1012)  Melatonin TABS 3 mg (3 mg Oral Given 08/30/15 2257)  morphine 2 MG/ML injection 2 mg (2 mg Intravenous Given 08/31/15 0023)  aspirin chewable tablet 324 mg (324 mg Oral Given 08/30/15 1620)  heparin bolus via infusion 4,000 Units (4,000 Units Intravenous Given 08/30/15 1718)  clopidogrel (PLAVIX) tablet 300 mg (300 mg Oral Given 08/30/15 2325)  heparin bolus via infusion 2,000 Units (2,000 Units Intravenous Given 08/31/15 0300)  iohexol (OMNIPAQUE) 300 MG/ML solution 75 mL (75 mLs Intravenous Contrast Given 08/31/15 1409)    Filed Vitals:   08/31/15 0050 08/31/15 0451 08/31/15 0539 08/31/15 0824  BP: 112/72 104/62  100/61  Pulse:    82  Temp: 98.6 F (37 C)  98 F (36.7 C)   TempSrc:   Oral   Resp: 15 16    Height:      Weight:      SpO2: 96%       Final diagnoses:  Mass of middle lobe of right lung    Admission/ observation were discussed with the admitting physician, patient and/or family and they are comfortable with the plan.    Deno Etienne, DO 08/31/15 1539

## 2015-08-30 NOTE — Patient Instructions (Signed)
Coated 81mg  aspirin daily

## 2015-08-30 NOTE — Telephone Encounter (Signed)
He stated that you did, please call him anyway he sounded confused. Thank you

## 2015-08-30 NOTE — Progress Notes (Signed)
Subjective:      Patient ID: Benjamin Hahn, male   DOB: 1933-03-07, 82 y.o.   MRN: [id number]  Chief complaint: Recent chest pain  HPI    He thinks he had an episode of chest pain a few days ago within the last 2 weeks in the evenings addressed.  Also last night he had chest pressure across the front of the chest.  This was not associated with shortness of breath disorder nausea and he took aspirin on his own and got better in 30 minutes.  He did not have any recurrence and feels fairly well today.  He has been less active recently and also not going up steps, does not feel he has had any exertional chest pains    LEG weakness: He is doing much better after asking him to stop his Flexeril and does not feel he has balance issues also    Hypothyroidism: This is long-standing and he is usually on a stable dose since 2014 Currently taking 88 mcg. Last lab results are as follows:  Lab Results  Component Value Date   FREET4 0.79 04/30/2015   FREET4 0.78 01/23/2015   FREET4 0.83 04/24/2014   TSH 1.37 04/30/2015   TSH 1.24 01/23/2015   TSH 3.19 08/09/2014      Weight gain: His weight has improved   Wt Readings from Last 3 Encounters:  08/30/15 197 lb (89.359 kg)  08/09/15 200 lb 12.8 oz (91.082 kg)  07/26/15 202 lb (91.627 kg)       Medication List       This list is accurate as of: 08/30/15  2:32 PM.  Always use your most recent med list.               ALPRAZolam 0.5 MG tablet  Commonly known as:  XANAX  1  Or  2  Prn  q day     aspirin 325 MG tablet  Take 325 mg by mouth daily at 10 pm. Reported on 08/30/2015     buPROPion 100 MG 12 hr tablet  Commonly known as:  WELLBUTRIN SR  Take 1 tablet (100 mg total) by mouth every morning.     clobetasol cream 0.05 %  Commonly known as:  TEMOVATE  Use as directed     hydrOXYzine 10 MG tablet  Commonly known as:  ATARAX/VISTARIL  TAKE 1 TO 3 TABLETS EVERY 4 TO 6 HOURS AS NEEDED FOR ITCHING. *MAY CAUSE DROWSINESS*      levothyroxine 88 MCG tablet  Commonly known as:  SYNTHROID, LEVOTHROID  TAKE 1 TABLET ONCE DAILY.     metoprolol succinate 25 MG 24 hr tablet  Commonly known as:  TOPROL-XL  Take 1 tablet (25 mg total) by mouth daily.     phenazopyridine 200 MG tablet  Commonly known as:  PYRIDIUM  Take 1 tablet (200 mg total) by mouth 3 (three) times daily as needed.     pravastatin 20 MG tablet  Commonly known as:  PRAVACHOL  Take 1 tablet (20 mg total) by mouth daily.     sildenafil 20 MG tablet  Commonly known as:  REVATIO  Take 1 tablet (20 mg total) by mouth as needed.     sulfamethoxazole-trimethoprim 800-160 MG tablet  Commonly known as:  BACTRIM DS,SEPTRA DS  Take 1 tablet by mouth 2 (two) times daily.     tadalafil 5 MG tablet  Commonly known as:  CIALIS  Take 1 tablet (5 mg total) by mouth daily  as needed for erectile dysfunction.     tamsulosin 0.4 MG Caps capsule  Commonly known as:  FLOMAX  TAKE (1) CAPSULE DAILY.     venlafaxine XR 150 MG 24 hr capsule  Commonly known as:  EFFEXOR-XR  Take 1 capsule (150 mg total) by mouth daily with breakfast.        Review of Systems    Long-standing hypogonadism: He has been treated by his urologist.  With his urologist he has been treated with Testopel pellet and did not go back for his follow-up for almost a year finally he made an appointment and had his pellet about a week ago His last testosterone level was excellent with regular treatment, he did have his last pellet inserted in December, later than usual appointment   Lab Results  Component Value Date   TESTOSTERONE 433.39 03/16/2014     Hypercholesterolemia: This has been long-standing and he has now been on pravastatin and had stopped Lipitor because of possible connection with his leg weakness   Lab Results  Component Value Date   CHOL 242* 04/30/2015   HDL 65.00 04/30/2015   LDLCALC 150* 04/30/2015   TRIG 135.0 04/30/2015   CHOLHDL 4 04/30/2015      BPH:  He still takes 0.8 mg of Flomax from his urologist     Objective:   Physical Exam BP 108/58 mmHg  Pulse 88  Temp(Src) 98.4 F (36.9 C)  Resp 14  Ht 5\' 8"  (1.727 m)  Wt 197 lb (89.359 kg)  BMI 29.96 kg/m2  SpO2 95%    Heart sounds regular, heart sounds normal Lungs clear Repeat blood pressure 104/62 standing   Assessment:      Episodes of chest pain/pressure.  His EKG showed a significant change in the inferior leads indicating a recent MI without ST elevation and also some T-wave changes compared to previous EKG on 07/21/15  Leg weakness is better with stopping Flexeril     Plan:      Discussed the patient's clinical history and examination and findings with Dr. Meda Coffee the cardiologist on-call and she recommended his going to the ER and this was explained to the patient.  He will have his wife take him  He needs follow-up labs for his lipids and this can be done in the ER     [facility]       Total visit time including coordination of care = 25 minutes

## 2015-08-31 ENCOUNTER — Inpatient Hospital Stay (HOSPITAL_COMMUNITY): Payer: Medicare Other

## 2015-08-31 LAB — HEPARIN LEVEL (UNFRACTIONATED)
HEPARIN UNFRACTIONATED: 0.37 [IU]/mL (ref 0.30–0.70)
Heparin Unfractionated: 0.44 IU/mL (ref 0.30–0.70)

## 2015-08-31 LAB — CBC
HEMATOCRIT: 35.8 % — AB (ref 39.0–52.0)
HEMOGLOBIN: 11.3 g/dL — AB (ref 13.0–17.0)
MCH: 30.1 pg (ref 26.0–34.0)
MCHC: 31.6 g/dL (ref 30.0–36.0)
MCV: 95.5 fL (ref 78.0–100.0)
Platelets: 224 10*3/uL (ref 150–400)
RBC: 3.75 MIL/uL — AB (ref 4.22–5.81)
RDW: 14.9 % (ref 11.5–15.5)
WBC: 6.4 10*3/uL (ref 4.0–10.5)

## 2015-08-31 LAB — LIPID PANEL
CHOLESTEROL: 147 mg/dL (ref 0–200)
HDL: 49 mg/dL (ref >40)
LDL CALC: 87 mg/dL (ref 0–99)
TRIGLYCERIDES: 55 mg/dL (ref <150)
Total CHOL/HDL Ratio: 3 RATIO
VLDL: 11 mg/dL (ref 0–40)

## 2015-08-31 LAB — BASIC METABOLIC PANEL
ANION GAP: 7 (ref 5–15)
BUN: 17 mg/dL (ref 6–20)
CO2: 27 mmol/L (ref 22–32)
Calcium: 8.2 mg/dL — ABNORMAL LOW (ref 8.9–10.3)
Chloride: 108 mmol/L (ref 101–111)
Creatinine, Ser: 1.28 mg/dL — ABNORMAL HIGH (ref 0.61–1.24)
GFR, EST AFRICAN AMERICAN: 59 mL/min — AB (ref >60)
GFR, EST NON AFRICAN AMERICAN: 51 mL/min — AB (ref >60)
GLUCOSE: 82 mg/dL (ref 65–99)
POTASSIUM: 3.6 mmol/L (ref 3.5–5.1)
SODIUM: 142 mmol/L (ref 135–145)

## 2015-08-31 LAB — TROPONIN I
TROPONIN I: 0.1 ng/mL — AB (ref <0.031)
Troponin I: 0.08 ng/mL — ABNORMAL HIGH (ref <0.031)

## 2015-08-31 MED ORDER — IOHEXOL 300 MG/ML  SOLN
75.0000 mL | Freq: Once | INTRAMUSCULAR | Status: AC | PRN
  Administered 2015-08-31: 75 mL via INTRAVENOUS

## 2015-08-31 MED ORDER — MELATONIN 3 MG PO TABS
6.0000 mg | ORAL_TABLET | Freq: Every day | ORAL | Status: DC
  Administered 2015-08-31 – 2015-09-01 (×2): 6 mg via ORAL
  Filled 2015-08-31 (×4): qty 2

## 2015-08-31 MED ORDER — MORPHINE SULFATE (PF) 2 MG/ML IV SOLN
2.0000 mg | INTRAVENOUS | Status: DC | PRN
  Administered 2015-08-31: 2 mg via INTRAVENOUS
  Filled 2015-08-31: qty 1

## 2015-08-31 MED ORDER — HEPARIN BOLUS VIA INFUSION
2000.0000 [IU] | Freq: Once | INTRAVENOUS | Status: AC
  Administered 2015-08-31: 2000 [IU] via INTRAVENOUS
  Filled 2015-08-31: qty 2000

## 2015-08-31 NOTE — Progress Notes (Signed)
[place] for Heparin Indication: chest pain/ACS  No Known Allergies  Patient Measurements: Height: 5\' 8"  (172.7 cm) Weight:  (refused weight until breakfast) IBW/kg (Calculated) : 68.4 Heparin Dosing Weight: 87 kg  Vital Signs: BP: 130/75 mmHg (01/07 1840) Pulse Rate: 82 (01/07 0824)  Labs:  Recent Labs  08/30/15 1530 08/30/15 2147 08/31/15 0054 08/31/15 0138 08/31/15 0451 08/31/15 1035 08/31/15 1814  HGB 12.7* 12.5*  --   --  11.3*  --   --   HCT 40.3 39.3  --   --  35.8*  --   --   PLT 255 245  --   --  224  --   --   HEPARINUNFRC  --   --   --  <0.10*  --  0.44 0.37  CREATININE 1.23 1.32*  --   --  1.28*  --   --   TROPONINI  --  0.11* 0.10*  --   --  0.08*  --     Estimated Creatinine Clearance: 50.1 mL/min (by C-G formula based on Cr of 1.28).   . sodium chloride 75 mL/hr at 08/31/15 0945  . heparin 1,550 Units/hr (08/31/15 0945)  . nitroGLYCERIN 10 mcg/min (08/31/15 0023)   Assessment: 82 y.o. male with chest pain for heparin. Heparin level remains therapeutic 0.44, 037 on heparin at 1550 units/hr.  CBC stable, no bleeding or complications noted.  Goal of Therapy:  Heparin level 0.3-0.7 units/ml Monitor platelets by anticoagulation protocol: Yes   Plan: 1. Continue IV heparin at current rate. 2. Daily heparin level and CBC. 4. F/u for plans for cath.  Victora Irby S. Alford Highland, PharmD, [facility] Clinical Staff Pharmacist Pager [phone]   08/31/2015 7:05 PM

## 2015-08-31 NOTE — Progress Notes (Signed)
[place] for Heparin Indication: chest pain/ACS  No Known Allergies  Patient Measurements: Height: 5\' 8"  (172.7 cm) Weight:  (refused weight until breakfast) IBW/kg (Calculated) : 68.4 Heparin Dosing Weight: 87 kg  Vital Signs: Temp: 98 F (36.7 C) (01/07 0539) Temp Source: Oral (01/07 0539) BP: 100/61 mmHg (01/07 0824) Pulse Rate: 82 (01/07 0824)  Labs:  Recent Labs  08/30/15 1530 08/30/15 2147 08/31/15 0054 08/31/15 0138 08/31/15 0451 08/31/15 1035  HGB 12.7* 12.5*  --   --  11.3*  --   HCT 40.3 39.3  --   --  35.8*  --   PLT 255 245  --   --  224  --   HEPARINUNFRC  --   --   --  <0.10*  --  0.44  CREATININE 1.23 1.32*  --   --  1.28*  --   TROPONINI  --  0.11* 0.10*  --   --   --     Estimated Creatinine Clearance: 50.1 mL/min (by C-G formula based on Cr of 1.28).   . sodium chloride 75 mL/hr at 08/31/15 0945  . heparin 1,550 Units/hr (08/31/15 0945)  . nitroGLYCERIN 10 mcg/min (08/31/15 0023)   Assessment: 82 y.o. male with chest pain for heparin. Heparin level now therapeutic on heparin at 1550 units/hr.  CBC stable, no bleeding or complications noted.  Goal of Therapy:  Heparin level 0.3-0.7 units/ml Monitor platelets by anticoagulation protocol: Yes   Plan: 1. Continue IV heparin at current rate. 2. Confirm heparin level at 1800 PM. 3. Daily heparin level and CBC. 4. F/u for plans for cath.  Uvaldo Rising, BCPS  Clinical Pharmacist Pager [phone]  08/31/2015 11:32 AM

## 2015-08-31 NOTE — Progress Notes (Signed)
Pt resting in bed and c/o 10/10 L arm pain. V/s stable, EKG obtained, troponin .11,  Dr.Harwani made aware. New orders received to titrate the iv nitro, prn morphine and for a stat troponin. Will cont to monitor pt.

## 2015-08-31 NOTE — Progress Notes (Signed)
Pt refused weight this morning and stated he would like to be weighed later in the day. Will cont to monitor pt.

## 2015-08-31 NOTE — Progress Notes (Signed)
Pt refusing bed alarm. Pt educated on the importance of having the bed alarm on. Pt encouraged to call when he needs to get up. Call light and phone with in reach.  Will cont to monitor pt.

## 2015-08-31 NOTE — Progress Notes (Signed)
Pt refusing bed alarm tonight. Pt told the importance of calling for assistance. Call light and phone within reach. Will cont to monitor pt.

## 2015-08-31 NOTE — Progress Notes (Signed)
Subjective:  Patient denies any chest pain or shortness of breath. States arm pain worse with movement. Cardiac enzymes are minimally elevated and trending down  Objective:  Vital Signs in the last 24 hours: Temp:  [98 F (36.7 C)-98.8 F (37.1 C)] 98 F (36.7 C) (01/07 0539) Pulse Rate:  [72-93] 82 (01/07 0824) Resp:  [12-21] 16 (01/07 0451) BP: (100-132)/(53-77) 100/61 mmHg (01/07 0824) SpO2:  [95 %-100 %] 96 % (01/07 0050) Weight:  [89.359 kg (197 lb)-89.948 kg (198 lb 4.8 oz)] 89.948 kg (198 lb 4.8 oz) (01/06 2108)  Intake/Output from previous day: 01/06 0701 - 01/07 0700 In: 645 [I.V.:645] Out: 600 [Urine:600] Intake/Output from this shift: Total I/O In: -  Out: 650 [Urine:650]  Physical Exam: Neck: no adenopathy, no carotid bruit, no JVD and supple, symmetrical, trachea midline Lungs: clear to auscultation bilaterally Heart: regular rate and rhythm, S1, S2 normal and Soft systolic murmur noted no S3 gallop Abdomen: soft, non-tender; bowel sounds normal; no masses,  no organomegaly Extremities: extremities normal, atraumatic, no cyanosis or edema  Lab Results:  Recent Labs  08/30/15 2147 08/31/15 0451  WBC 6.9 6.4  HGB 12.5* 11.3*  PLT 245 224    Recent Labs  08/30/15 2147 08/31/15 0451  NA 141 142  K 3.8 3.6  CL 102 108  CO2 29 27  GLUCOSE 126* 82  BUN 17 17  CREATININE 1.32* 1.28*    Recent Labs  08/31/15 0054 08/31/15 1035  TROPONINI 0.10* 0.08*   Hepatic Function Panel  Recent Labs  08/30/15 2147  PROT 6.1*  ALBUMIN 3.3*  AST 17  ALT 12*  ALKPHOS 47  BILITOT 0.4    Recent Labs  08/31/15 0451  CHOL 147   No results for input(s): PROTIME in the last 72 hours.  Imaging: Imaging results have been reviewed and Dg Chest 2 View  08/30/2015  CLINICAL DATA:  Patient with chest pain.  Smoker. EXAM: CHEST  2 VIEW COMPARISON:  Chest radiograph 07/21/2015. FINDINGS: Stable cardiac and mediastinal contours. Low lung volumes. Irregular  opacity within the right mid lung. No large area of pulmonary consolidation, pleural effusion or pneumothorax. Mid thoracic spine degenerative changes. IMPRESSION: Irregular opacity within the right mid lung is nonspecific however underlying pulmonary malignancy is not excluded. Recommend correlation with chest CT. Electronically Signed   By: Lovey Newcomer M.D.   On: 08/30/2015 16:20    Cardiac Studies:  Assessment/Plan:  Recent inferior wall MI Hypertension Hyperlipidemia Hypothyroidism Right midlung opacity rule out mass Depression History of questionable temporal arteritis in the past History of herpes in the past History of bronchial asthma Plan Continue present management Discussed with patient and his wife over the phone regarding left cardiac cath possible PTCA stenting its risk and benefits i.e. death MI stroke need for emergency CABG local vascular complications etc. and consents for PCI will schedule him for Monday. We will get CT of the chest to evaluate midlung opacity.  LOS: 1 day    Charolette Forward 08/31/2015, 12:26 PM

## 2015-08-31 NOTE — Progress Notes (Signed)
ANTICOAGULATION CONSULT NOTE Pharmacy Consult for Heparin Indication: chest pain/ACS  No Known Allergies  Patient Measurements: Height: 5\' 8"  (172.7 cm) Weight: 198 lb 4.8 oz (89.948 kg) IBW/kg (Calculated) : 68.4 Heparin Dosing Weight: 87 kg  Vital Signs: Temp: 98.6 F (37 C) (01/07 0050) Temp Source: Oral (01/06 2108) BP: 112/72 mmHg (01/07 0050) Pulse Rate: 88 (01/06 2108)  Labs:  Recent Labs  08/30/15 1530 08/30/15 2147 08/31/15 0054 08/31/15 0138  HGB 12.7* 12.5*  --   --   HCT 40.3 39.3  --   --   PLT 255 245  --   --   HEPARINUNFRC  --   --   --  <0.10*  CREATININE 1.23 1.32*  --   --   TROPONINI  --  0.11* 0.10*  --     Estimated Creatinine Clearance: 48.6 mL/min (by C-G formula based on Cr of 1.32).  Assessment: 82 y.o. male with chest pain for heparin   Goal of Therapy:  Heparin level 0.3-0.7 units/ml Monitor platelets by anticoagulation protocol: Yes   Plan: Heparin 2000 units IV bolus, then increase heparin  1550 units/hr Check heparin level in 8 hours.  Phillis Knack, PharmD, BCPS    08/31/2015,2:55 AM

## 2015-09-01 ENCOUNTER — Other Ambulatory Visit: Payer: Self-pay

## 2015-09-01 LAB — CBC
HCT: 38.2 % — ABNORMAL LOW (ref 39.0–52.0)
Hemoglobin: 11.8 g/dL — ABNORMAL LOW (ref 13.0–17.0)
MCH: 29.7 pg (ref 26.0–34.0)
MCHC: 30.9 g/dL (ref 30.0–36.0)
MCV: 96.2 fL (ref 78.0–100.0)
PLATELETS: 210 10*3/uL (ref 150–400)
RBC: 3.97 MIL/uL — AB (ref 4.22–5.81)
RDW: 15 % (ref 11.5–15.5)
WBC: 7.2 10*3/uL (ref 4.0–10.5)

## 2015-09-01 LAB — HEPARIN LEVEL (UNFRACTIONATED): Heparin Unfractionated: 0.49 IU/mL (ref 0.30–0.70)

## 2015-09-01 MED ORDER — SODIUM CHLORIDE 0.9 % IV SOLN: 250.0000 mL | INTRAVENOUS | Status: DC | PRN

## 2015-09-01 MED ORDER — ASPIRIN 81 MG PO CHEW
81.0000 mg | CHEWABLE_TABLET | ORAL | Status: AC
  Administered 2015-09-02: 81 mg via ORAL
  Filled 2015-09-01: qty 1

## 2015-09-01 MED ORDER — SODIUM CHLORIDE 0.9 % IJ SOLN: 3.0000 mL | INTRAMUSCULAR | Status: DC | PRN

## 2015-09-01 MED ORDER — SODIUM CHLORIDE 0.9 % WEIGHT BASED INFUSION
1.0000 mL/kg/h | INTRAVENOUS | Status: DC
  Administered 2015-09-02: 1 mL/kg/h via INTRAVENOUS

## 2015-09-01 MED ORDER — SODIUM CHLORIDE 0.9 % IJ SOLN: 3.0000 mL | Freq: Two times a day (BID) | INTRAMUSCULAR | Status: DC

## 2015-09-01 NOTE — Plan of Care (Signed)
Problem: Consults Goal: Cardiac Cath Patient Education (See Patient Education module for education specifics.) Outcome: Progressing Pt educated on the necessity of anticoagulation

## 2015-09-01 NOTE — Progress Notes (Signed)
Subjective:  Doing well denies any chest pain or shortness of breath.  Objective:  Vital Signs in the last 24 hours: Temp:  [98.2 F (36.8 C)-98.6 F (37 C)] 98.3 F (36.8 C) (01/08 0437) Pulse Rate:  [73] 73 (01/08 0838) Resp:  [18] 18 (01/08 0437) BP: (115-137)/(75-93) 129/79 mmHg (01/08 0838) SpO2:  [94 %-100 %] 94 % (01/08 0437) Weight:  [90.266 kg (199 lb)] 90.266 kg (199 lb) (01/08 0437)  Intake/Output from previous day: 01/07 0701 - 01/08 0700 In: 1372 [P.O.:622; I.V.:750] Out: 2900 [Urine:2900] Intake/Output from this shift: Total I/O In: 360 [P.O.:360] Out: 750 [Urine:750]  Physical Exam: Neck: no adenopathy, no carotid bruit, no JVD and supple, symmetrical, trachea midline Lungs: clear to auscultation bilaterally Heart: regular rate and rhythm, S1, S2 normal and Soft systolic murmur noted no S3 gallop Abdomen: soft, non-tender; bowel sounds normal; no masses,  no organomegaly Extremities: extremities normal, atraumatic, no cyanosis or edema  Lab Results:  Recent Labs  08/31/15 0451 09/01/15 0546  WBC 6.4 7.2  HGB 11.3* 11.8*  PLT 224 210    Recent Labs  08/30/15 2147 08/31/15 0451  NA 141 142  K 3.8 3.6  CL 102 108  CO2 29 27  GLUCOSE 126* 82  BUN 17 17  CREATININE 1.32* 1.28*    Recent Labs  08/31/15 0054 08/31/15 1035  TROPONINI 0.10* 0.08*   Hepatic Function Panel  Recent Labs  08/30/15 2147  PROT 6.1*  ALBUMIN 3.3*  AST 17  ALT 12*  ALKPHOS 47  BILITOT 0.4    Recent Labs  08/31/15 0451  CHOL 147   No results for input(s): PROTIME in the last 72 hours.  Imaging: Imaging results have been reviewed and Dg Chest 2 View  08/30/2015  CLINICAL DATA:  Patient with chest pain.  Smoker. EXAM: CHEST  2 VIEW COMPARISON:  Chest radiograph 07/21/2015. FINDINGS: Stable cardiac and mediastinal contours. Low lung volumes. Irregular opacity within the right mid lung. No large area of pulmonary consolidation, pleural effusion or  pneumothorax. Mid thoracic spine degenerative changes. IMPRESSION: Irregular opacity within the right mid lung is nonspecific however underlying pulmonary malignancy is not excluded. Recommend correlation with chest CT. Electronically Signed   By: Lovey Newcomer M.D.   On: 08/30/2015 16:20   Ct Chest W Contrast  08/31/2015  CLINICAL DATA:  Evaluate for possible right middle lobe mass EXAM: CT CHEST WITH CONTRAST TECHNIQUE: Multidetector CT imaging of the chest was performed during intravenous contrast administration. CONTRAST:  37mL OMNIPAQUE IOHEXOL 300 MG/ML  SOLN COMPARISON:  08/30/2015 chest radiograph, 09/09/2013 CT scan FINDINGS: Mediastinum/Nodes: Sub cm left midpole thyroid nodule. No significant hilar or mediastinal adenopathy. Mild aortic calcification without dilatation. Coronary artery calcification noted. Normal heart size. No pericardial effusion. Lungs/Pleura: No pleural effusion. The left lung is clear. On the right, there is very mild tree-in-bud infiltrate laterally posteriorly in the right upper lobe. This is similar to 09/09/2013. There is a sub solid 4 mm nodule in the anterior inferior aspect of the right upper lobe up on series 3, image number 40. This is stable from 09/09/2013. Posteriorly medially in the right lower lobe there is consolidation measuring 20 x 47 mm. This demonstrates a tail of atelectatic tissue extending toward the inferior right hilum. There is calcification in the consolidated lung. This process is larger and more conspicuous than it was previously. There is no evidence of mass in the mid lung zone in the area questioned on the radiograph performed earlier  today. There is subsegmental atelectasis in the medial inferior right middle lobe. Upper abdomen: 1.5 cm low-attenuation lesion right lobe of the liver with average attenuation value of 11 most consistent with a cyst. This is stable from 2015. Partially visualized 3 cm low-attenuation lesion left kidney with attenuation  value of 3 in the part that is visualized. Musculoskeletal: No acute findings IMPRESSION: Persistent tree in bud infiltrate right upper lobe and stable sub cm sub solid nodule. Progressive peripheral consolidation or atelectasis with calcification in the medial right lung base. Progressive atypical infection is primary differential diagnostic consideration. Pulmonology consultation suggested. Follow-up CT scan in 6-12 months recommended. Stable liver cyst Probable left renal cyst not completely visualized No evidence of mass corresponding to finding on chest radiograph performed earlier today. Electronically Signed   By: Skipper Cliche M.D.   On: 08/31/2015 14:47    Cardiac Studies:  Assessment/Plan:  Recent inferior wall MI Hypertension Hyperlipidemia Hypothyroidism Depression History of questionable temporal arteritis in the past History of herpes in the past History of bronchial asthma Plan Continue present management Schedule for left cath possible PTCA stenting in a.m.  LOS: 2 days    Benjamin Hahn 09/01/2015, 11:45 AM

## 2015-09-01 NOTE — Progress Notes (Signed)
ANTICOAGULATION CONSULT NOTE Pharmacy Consult for Heparin Indication: chest pain/ACS  No Known Allergies  Patient Measurements: Height: 5\' 8"  (172.7 cm) Weight: 199 lb (90.266 kg) IBW/kg (Calculated) : 68.4 Heparin Dosing Weight: 87 kg  Vital Signs: Temp: 98.3 F (36.8 C) (01/08 0437) Temp Source: Oral (01/08 0437) BP: 129/79 mmHg (01/08 0838) Pulse Rate: 73 (01/08 0838)  Labs:  Recent Labs  08/30/15 1530 08/30/15 2147 08/31/15 0054  08/31/15 0451 08/31/15 1035 08/31/15 1814 09/01/15 0546  HGB 12.7* 12.5*  --   --  11.3*  --   --  11.8*  HCT 40.3 39.3  --   --  35.8*  --   --  38.2*  PLT 255 245  --   --  224  --   --  210  HEPARINUNFRC  --   --   --   < >  --  0.44 0.37 0.49  CREATININE 1.23 1.32*  --   --  1.28*  --   --   --   TROPONINI  --  0.11* 0.10*  --   --  0.08*  --   --   < > = values in this interval not displayed.  Estimated Creatinine Clearance: 50.3 mL/min (by C-G formula based on Cr of 1.28).   . sodium chloride 75 mL/hr at 08/31/15 2247  . heparin 1,550 Units/hr (09/01/15 0834)  . nitroGLYCERIN 10 mcg/min (09/01/15 [id number])   Assessment: 82 y.o. male with chest pain for heparin. Heparin level remains therapeutic 0.49 on heparin at 1550 units/hr.  CBC stable, no bleeding or complications noted.  Awaiting cath lab tomorrow.  Goal of Therapy:  Heparin level 0.3-0.7 units/ml Monitor platelets by anticoagulation protocol: Yes   Plan: 1. Continue IV heparin at current rate. 2. Daily heparin level and CBC. 3. F/u for plans heparin after cath.  Uvaldo Rising, BCPS  Clinical Pharmacist Pager [phone]  09/01/2015 10:27 AM

## 2015-09-02 ENCOUNTER — Encounter (HOSPITAL_COMMUNITY): Payer: Self-pay | Admitting: Cardiology

## 2015-09-02 ENCOUNTER — Encounter (HOSPITAL_COMMUNITY)
Admission: EM | Disposition: A | Discharge status: Home / Self Care | Payer: Self-pay | Source: Home / Self Care | Attending: Cardiology

## 2015-09-02 ENCOUNTER — Other Ambulatory Visit: Payer: Self-pay

## 2015-09-02 HISTORY — PX: CARDIAC CATHETERIZATION: SHX172

## 2015-09-02 LAB — CBC
HCT: 36.1 % — ABNORMAL LOW (ref 39.0–52.0)
Hemoglobin: 11.4 g/dL — ABNORMAL LOW (ref 13.0–17.0)
MCH: 30.1 pg (ref 26.0–34.0)
MCHC: 31.6 g/dL (ref 30.0–36.0)
MCV: 95.3 fL (ref 78.0–100.0)
PLATELETS: 222 10*3/uL (ref 150–400)
RBC: 3.79 MIL/uL — AB (ref 4.22–5.81)
RDW: 15.2 % (ref 11.5–15.5)
WBC: 6.1 10*3/uL (ref 4.0–10.5)

## 2015-09-02 LAB — POCT ACTIVATED CLOTTING TIME
ACTIVATED CLOTTING TIME: 342 s
Activated Clotting Time: 327 seconds

## 2015-09-02 LAB — PROTIME-INR
INR: 3.18 — AB (ref 0.00–1.49)
PROTHROMBIN TIME: 32 s — AB (ref 11.6–15.2)

## 2015-09-02 SURGERY — LEFT HEART CATH AND CORONARY ANGIOGRAPHY
Anesthesia: LOCAL

## 2015-09-02 MED ORDER — ANGIOPLASTY BOOK
Freq: Once | Status: AC
  Administered 2015-09-03: 07:00:00
  Filled 2015-09-02: qty 1

## 2015-09-02 MED ORDER — CLOPIDOGREL BISULFATE 300 MG PO TABS
ORAL_TABLET | ORAL | Status: DC | PRN
  Administered 2015-09-02: 300 mg via ORAL

## 2015-09-02 MED ORDER — BIVALIRUDIN 250 MG IV SOLR
INTRAVENOUS | Status: AC
  Filled 2015-09-02: qty 250

## 2015-09-02 MED ORDER — FAMOTIDINE IN NACL 20-0.9 MG/50ML-% IV SOLN
INTRAVENOUS | Status: DC | PRN
  Administered 2015-09-02: 20 mg via INTRAVENOUS

## 2015-09-02 MED ORDER — BIVALIRUDIN BOLUS VIA INFUSION - CUPID
INTRAVENOUS | Status: DC | PRN
  Administered 2015-09-02: 68.175 mg via INTRAVENOUS

## 2015-09-02 MED ORDER — SODIUM CHLORIDE 0.9 % IV SOLN
250.0000 mg | INTRAVENOUS | Status: DC | PRN
  Administered 2015-09-02: 250 mg
  Administered 2015-09-02: 1.75 mg/kg/h via INTRAVENOUS

## 2015-09-02 MED ORDER — FAMOTIDINE IN NACL 20-0.9 MG/50ML-% IV SOLN
INTRAVENOUS | Status: AC
  Filled 2015-09-02: qty 50

## 2015-09-02 MED ORDER — NITROGLYCERIN 1 MG/10 ML FOR IR/CATH LAB
INTRA_ARTERIAL | Status: DC | PRN
  Administered 2015-09-02: 20 mL

## 2015-09-02 MED ORDER — SODIUM CHLORIDE 0.9 % IJ SOLN
INTRAMUSCULAR | Status: DC | PRN
  Administered 2015-09-02: 250 mL via INTRAVENOUS

## 2015-09-02 MED ORDER — SODIUM CHLORIDE 0.9 % IJ SOLN
3.0000 mL | Freq: Two times a day (BID) | INTRAMUSCULAR | Status: DC
  Administered 2015-09-02: 3 mL via INTRAVENOUS

## 2015-09-02 MED ORDER — FENTANYL CITRATE (PF) 100 MCG/2ML IJ SOLN
INTRAMUSCULAR | Status: AC
  Filled 2015-09-02: qty 2

## 2015-09-02 MED ORDER — HEART ATTACK BOUNCING BOOK
Freq: Once | Status: DC
  Filled 2015-09-02: qty 1

## 2015-09-02 MED ORDER — SODIUM CHLORIDE 0.9 % IJ SOLN: 3.0000 mL | INTRAMUSCULAR | Status: DC | PRN

## 2015-09-02 MED ORDER — MIDAZOLAM HCL 2 MG/2ML IJ SOLN
INTRAMUSCULAR | Status: DC | PRN
  Administered 2015-09-02: 1 mg via INTRAVENOUS

## 2015-09-02 MED ORDER — MIDAZOLAM HCL 2 MG/2ML IJ SOLN
INTRAMUSCULAR | Status: AC
  Filled 2015-09-02: qty 2

## 2015-09-02 MED ORDER — SODIUM CHLORIDE 0.9 % IV SOLN: 250.0000 mL | INTRAVENOUS | Status: DC | PRN

## 2015-09-02 MED ORDER — BIVALIRUDIN 250 MG IV SOLR
INTRAVENOUS | Status: AC
Start: 2015-09-02 — End: 2015-09-02
  Filled 2015-09-02: qty 250

## 2015-09-02 MED ORDER — NITROGLYCERIN 1 MG/10 ML FOR IR/CATH LAB
INTRA_ARTERIAL | Status: AC
  Filled 2015-09-02: qty 10

## 2015-09-02 MED ORDER — ATROPINE SULFATE 0.1 MG/ML IJ SOLN
INTRAMUSCULAR | Status: AC
  Filled 2015-09-02: qty 10

## 2015-09-02 MED ORDER — FENTANYL CITRATE (PF) 100 MCG/2ML IJ SOLN
INTRAMUSCULAR | Status: DC | PRN
  Administered 2015-09-02 (×2): 25 ug via INTRAVENOUS

## 2015-09-02 MED ORDER — SODIUM CHLORIDE 0.9 % IV SOLN: INTRAVENOUS | Status: DC

## 2015-09-02 MED ORDER — IOHEXOL 350 MG/ML SOLN
INTRAVENOUS | Status: DC | PRN
  Administered 2015-09-02: 240 mL via INTRA_ARTERIAL

## 2015-09-02 MED ORDER — HEPARIN (PORCINE) IN NACL 2-0.9 UNIT/ML-% IJ SOLN
INTRAMUSCULAR | Status: AC
  Filled 2015-09-02: qty 1000

## 2015-09-02 MED ORDER — ASPIRIN 81 MG PO CHEW
81.0000 mg | CHEWABLE_TABLET | Freq: Every day | ORAL | Status: DC
  Administered 2015-09-03: 11:00:00 81 mg via ORAL
  Filled 2015-09-02: qty 1

## 2015-09-02 MED ORDER — LIDOCAINE HCL (PF) 1 % IJ SOLN
INTRAMUSCULAR | Status: AC
  Filled 2015-09-02: qty 30

## 2015-09-02 MED ORDER — CLOPIDOGREL BISULFATE 300 MG PO TABS
ORAL_TABLET | ORAL | Status: AC
  Filled 2015-09-02: qty 1

## 2015-09-02 MED ORDER — CLOPIDOGREL BISULFATE 75 MG PO TABS
75.0000 mg | ORAL_TABLET | Freq: Every day | ORAL | Status: DC
  Administered 2015-09-03: 10:00:00 75 mg via ORAL
  Filled 2015-09-02: qty 1

## 2015-09-02 SURGICAL SUPPLY — 31 items
BALLN TREK OTW 2.5X12 (BALLOONS)
BALLN TREK RX 2.5X12 (BALLOONS) ×2
BALLN ~~LOC~~ EMERGE MR 2.75X15 (BALLOONS) ×2
BALLN ~~LOC~~ EMERGE MR 3.0X15 (BALLOONS) ×2
BALLN ~~LOC~~ TREK RX 3.0X20 (BALLOONS) ×2
BALLN ~~LOC~~ TREK RX 3.25X20 (BALLOONS) ×2 IMPLANT
BALLOON TREK OTW 2.5X12 (BALLOONS) IMPLANT
BALLOON TREK RX 2.5X12 (BALLOONS) ×1 IMPLANT
BALLOON ~~LOC~~ EMERGE MR 2.75X15 (BALLOONS) ×1 IMPLANT
BALLOON ~~LOC~~ EMERGE MR 3.0X15 (BALLOONS) ×1 IMPLANT
BALLOON ~~LOC~~ TREK RX 3.0X20 (BALLOONS) ×1 IMPLANT
CATH INFINITI 5FR MULTPACK ANG (CATHETERS) ×2 IMPLANT
CATH VISTA GUIDE 6FR JR4 (CATHETERS) ×2 IMPLANT
GUIDE CATH RUNWAY 6FR VL3.5 (CATHETERS) ×2 IMPLANT
GUIDELINER 6F (CATHETERS) ×2 IMPLANT
KIT ENCORE 26 ADVANTAGE (KITS) ×2 IMPLANT
KIT HEART LEFT (KITS) ×2 IMPLANT
PACK CARDIAC CATHETERIZATION (CUSTOM PROCEDURE TRAY) ×2 IMPLANT
SHEATH PINNACLE 5F 10CM (SHEATH) ×2 IMPLANT
SHEATH PINNACLE 6F 10CM (SHEATH) ×2 IMPLANT
STENT XIENCE ALPINE RX 3.0X23 (Permanent Stent) ×2 IMPLANT
STENT XIENCE ALPINE RX 3.0X28 (Permanent Stent) ×2 IMPLANT
SYR CONTROL 10ML ANGIOGRAPHIC (SYRINGE) ×2 IMPLANT
SYR MEDRAD MARK V 150ML (SYRINGE) ×2 IMPLANT
TRANSDUCER W/STOPCOCK (MISCELLANEOUS) ×2 IMPLANT
TUBING CIL FLEX 10 FLL-RA (TUBING) ×2 IMPLANT
VALVE MANIFOLD 3 PORT W/RA/ON (MISCELLANEOUS) ×2 IMPLANT
WIRE ASAHI PROWATER 180CM (WIRE) ×2 IMPLANT
WIRE EMERALD 3MM-J .035X150CM (WIRE) ×2 IMPLANT
WIRE PT2 MS 185 (WIRE) ×2 IMPLANT
WIRE RUNTHROUGH .014X180CM (WIRE) ×2 IMPLANT

## 2015-09-02 NOTE — Interval H&P Note (Signed)
Cath Lab Visit (complete for each Cath Lab visit)  Clinical Evaluation Leading to the Procedure:   ACS: Yes.    Non-ACS:    Anginal Classification: CCS IV  Anti-ischemic medical therapy: Maximal Therapy (2 or more classes of medications)  Non-Invasive Test Results: No non-invasive testing performed  Prior CABG: No previous CABG      History and Physical Interval Note:  09/02/2015 7:30 AM  Benjamin Hahn  has presented today for surgery, with the diagnosis of unstable angina  The various methods of treatment have been discussed with the patient and family. After consideration of risks, benefits and other options for treatment, the patient has consented to  Procedure(s): Left Heart Cath and Coronary Angiography (N/A) as a surgical intervention .  The patient's history has been reviewed, patient examined, no change in status, stable for surgery.  I have reviewed the patient's chart and labs.  Questions were answered to the patient's satisfaction.     Benjamin Hahn

## 2015-09-02 NOTE — H&P (View-Only) (Signed)
Subjective:  Doing well denies any chest pain or shortness of breath.  Objective:  Vital Signs in the last 24 hours: Temp:  [98.2 F (36.8 C)-98.6 F (37 C)] 98.3 F (36.8 C) (01/08 0437) Pulse Rate:  [73] 73 (01/08 0838) Resp:  [18] 18 (01/08 0437) BP: (115-137)/(75-93) 129/79 mmHg (01/08 0838) SpO2:  [94 %-100 %] 94 % (01/08 0437) Weight:  [90.266 kg (199 lb)] 90.266 kg (199 lb) (01/08 0437)  Intake/Output from previous day: 01/07 0701 - 01/08 0700 In: 1372 [P.O.:622; I.V.:750] Out: 2900 [Urine:2900] Intake/Output from this shift: Total I/O In: 360 [P.O.:360] Out: 750 [Urine:750]  Physical Exam: Neck: no adenopathy, no carotid bruit, no JVD and supple, symmetrical, trachea midline Lungs: clear to auscultation bilaterally Heart: regular rate and rhythm, S1, S2 normal and Soft systolic murmur noted no S3 gallop Abdomen: soft, non-tender; bowel sounds normal; no masses,  no organomegaly Extremities: extremities normal, atraumatic, no cyanosis or edema  Lab Results:  Recent Labs  08/31/15 0451 09/01/15 0546  WBC 6.4 7.2  HGB 11.3* 11.8*  PLT 224 210    Recent Labs  08/30/15 2147 08/31/15 0451  NA 141 142  K 3.8 3.6  CL 102 108  CO2 29 27  GLUCOSE 126* 82  BUN 17 17  CREATININE 1.32* 1.28*    Recent Labs  08/31/15 0054 08/31/15 1035  TROPONINI 0.10* 0.08*   Hepatic Function Panel  Recent Labs  08/30/15 2147  PROT 6.1*  ALBUMIN 3.3*  AST 17  ALT 12*  ALKPHOS 47  BILITOT 0.4    Recent Labs  08/31/15 0451  CHOL 147   No results for input(s): PROTIME in the last 72 hours.  Imaging: Imaging results have been reviewed and Dg Chest 2 View  08/30/2015  CLINICAL DATA:  Patient with chest pain.  Smoker. EXAM: CHEST  2 VIEW COMPARISON:  Chest radiograph 07/21/2015. FINDINGS: Stable cardiac and mediastinal contours. Low lung volumes. Irregular opacity within the right mid lung. No large area of pulmonary consolidation, pleural effusion or  pneumothorax. Mid thoracic spine degenerative changes. IMPRESSION: Irregular opacity within the right mid lung is nonspecific however underlying pulmonary malignancy is not excluded. Recommend correlation with chest CT. Electronically Signed   By: Lovey Newcomer M.D.   On: 08/30/2015 16:20   Ct Chest W Contrast  08/31/2015  CLINICAL DATA:  Evaluate for possible right middle lobe mass EXAM: CT CHEST WITH CONTRAST TECHNIQUE: Multidetector CT imaging of the chest was performed during intravenous contrast administration. CONTRAST:  72mL OMNIPAQUE IOHEXOL 300 MG/ML  SOLN COMPARISON:  08/30/2015 chest radiograph, 09/09/2013 CT scan FINDINGS: Mediastinum/Nodes: Sub cm left midpole thyroid nodule. No significant hilar or mediastinal adenopathy. Mild aortic calcification without dilatation. Coronary artery calcification noted. Normal heart size. No pericardial effusion. Lungs/Pleura: No pleural effusion. The left lung is clear. On the right, there is very mild tree-in-bud infiltrate laterally posteriorly in the right upper lobe. This is similar to 09/09/2013. There is a sub solid 4 mm nodule in the anterior inferior aspect of the right upper lobe up on series 3, image number 40. This is stable from 09/09/2013. Posteriorly medially in the right lower lobe there is consolidation measuring 20 x 47 mm. This demonstrates a tail of atelectatic tissue extending toward the inferior right hilum. There is calcification in the consolidated lung. This process is larger and more conspicuous than it was previously. There is no evidence of mass in the mid lung zone in the area questioned on the radiograph performed earlier  today. There is subsegmental atelectasis in the medial inferior right middle lobe. Upper abdomen: 1.5 cm low-attenuation lesion right lobe of the liver with average attenuation value of 11 most consistent with a cyst. This is stable from 2015. Partially visualized 3 cm low-attenuation lesion left kidney with attenuation  value of 3 in the part that is visualized. Musculoskeletal: No acute findings IMPRESSION: Persistent tree in bud infiltrate right upper lobe and stable sub cm sub solid nodule. Progressive peripheral consolidation or atelectasis with calcification in the medial right lung base. Progressive atypical infection is primary differential diagnostic consideration. Pulmonology consultation suggested. Follow-up CT scan in 6-12 months recommended. Stable liver cyst Probable left renal cyst not completely visualized No evidence of mass corresponding to finding on chest radiograph performed earlier today. Electronically Signed   By: Skipper Cliche M.D.   On: 08/31/2015 14:47    Cardiac Studies:  Assessment/Plan:  Recent inferior wall MI Hypertension Hyperlipidemia Hypothyroidism Depression History of questionable temporal arteritis in the past History of herpes in the past History of bronchial asthma Plan Continue present management Schedule for left cath possible PTCA stenting in a.m.  LOS: 2 days    Charolette Forward 09/01/2015, 11:45 AM

## 2015-09-02 NOTE — Progress Notes (Signed)
UR COMPLETED  

## 2015-09-02 NOTE — Progress Notes (Signed)
Site area: right groin  Site Prior to Removal:  Level 0  Pressure Applied For 20 MINUTES    Minutes Beginning at 1330  Manual:   Yes.    Patient Status During Pull:  AAO X 4  Post Pull Groin Site:  Level 0  Post Pull Instructions Given:  Yes.    Post Pull Pulses Present:  Yes.    Dressing Applied:  Yes.    Comments:  Tolerated procedure well

## 2015-09-02 NOTE — Progress Notes (Signed)
angiomax off at 11AM.

## 2015-09-03 ENCOUNTER — Other Ambulatory Visit: Payer: Self-pay

## 2015-09-03 LAB — CBC
HEMATOCRIT: 40.4 % (ref 39.0–52.0)
Hemoglobin: 12.6 g/dL — ABNORMAL LOW (ref 13.0–17.0)
MCH: 30.6 pg (ref 26.0–34.0)
MCHC: 31.2 g/dL (ref 30.0–36.0)
MCV: 98.1 fL (ref 78.0–100.0)
PLATELETS: 213 10*3/uL (ref 150–400)
RBC: 4.12 MIL/uL — AB (ref 4.22–5.81)
RDW: 15.5 % (ref 11.5–15.5)
WBC: 9.7 10*3/uL (ref 4.0–10.5)

## 2015-09-03 LAB — BASIC METABOLIC PANEL
Anion gap: 10 (ref 5–15)
BUN: 11 mg/dL (ref 6–20)
CHLORIDE: 105 mmol/L (ref 101–111)
CO2: 25 mmol/L (ref 22–32)
CREATININE: 1.38 mg/dL — AB (ref 0.61–1.24)
Calcium: 8.8 mg/dL — ABNORMAL LOW (ref 8.9–10.3)
GFR, EST AFRICAN AMERICAN: 54 mL/min — AB (ref >60)
GFR, EST NON AFRICAN AMERICAN: 47 mL/min — AB (ref >60)
Glucose, Bld: 90 mg/dL (ref 65–99)
POTASSIUM: 4.1 mmol/L (ref 3.5–5.1)
SODIUM: 140 mmol/L (ref 135–145)

## 2015-09-03 MED ORDER — CLOPIDOGREL BISULFATE 75 MG PO TABS: 75.0000 mg | ORAL_TABLET | Freq: Every day | ORAL | Status: DC

## 2015-09-03 MED ORDER — RAMIPRIL 1.25 MG PO CAPS: 1.2500 mg | ORAL_CAPSULE | Freq: Every day | ORAL | Status: DC

## 2015-09-03 MED ORDER — NITROGLYCERIN 0.4 MG SL SUBL: 0.4000 mg | SUBLINGUAL_TABLET | SUBLINGUAL | Status: DC | PRN

## 2015-09-03 MED ORDER — ASPIRIN 81 MG PO CHEW: 81.0000 mg | CHEWABLE_TABLET | Freq: Every day | ORAL | Status: DC

## 2015-09-03 MED ORDER — ATORVASTATIN CALCIUM 40 MG PO TABS: 40.0000 mg | ORAL_TABLET | Freq: Every day | ORAL | Status: DC

## 2015-09-03 NOTE — Discharge Summary (Signed)
NAME:  Benjamin Hahn, Benjamin Hahn NO.:  [id number]  MEDICAL RECORD NO.:  [id number]  LOCATION:  6C03C                        FACILITY:  [facility]  PHYSICIAN:  Kallum Jorgensen N. Terrence Dupont, M.D. DATE OF BIRTH:  Jun 01, 1933  DATE OF ADMISSION:  08/30/2015 DATE OF DISCHARGE:  09/03/2015                              DISCHARGE SUMMARY   ADMITTING DIAGNOSES: 1. Acute coronary syndrome, possible old inferior wall myocardial     infarction, age undetermined. 2. Hypertension. 3. Hyperlipidemia. 4. Hypothyroidism. 5. History of bronchial asthma. 6. Right mid lung opacity, rule out mass. 7. Depression. 8. History of questionable temporal arteritis in the past. 9. History of herpes in the past.  DISCHARGE DIAGNOSES: 1. Status post recent inferior wall myocardial infarction. 2. Post infarct angina. 3. Multivessel coronary artery disease, status post left cardiac     cath/percutaneous transluminal coronary angiography and stenting to     right coronary artery and left anterior descending with excellent     angiographic results. 4. Hypertension. 5. Hyperlipidemia. 6. Hypothyroidism. 7. History of bronchial asthma. 8. Questionable right mid lung opacity.  CT of the chest showed no     evidence of mass in the region of right mid lung opacity. 9. Depression. 10.History of questionable temporal arteritis in the past. 11.History of herpes in the past.  DISCHARGE HOME MEDICATIONS: 1. Aspirin 81 mg 1 tablet daily. 2. Atorvastatin 40 mg daily. 3. Clopidogrel 75 mg daily. 4. Nitrostat sublingual p.r.n. 5. Ramipril 1.25 mg 1 capsule daily. 6. Tylenol 500 mg as needed as before. 7. Tofranil 10 mg daily at night. 8. Metoprolol succinate 25 mg daily as before. 9. Bupropion 100 mg daily. 10.Levothyroxine 8 mcg daily. 11.Flomax 0.4 mg 1 capsule daily. 12.The patient has been advised to stop alprazolam, pravastatin,     Effexor.  DIET:  Low salt, low cholesterol.  ACTIVITY:  Increase activity  slowly as tolerated.  The patient will be scheduled for phase 2 cardiac rehab as outpatient.  Post cardiac cath/PTCA stent instructions have been given.  FOLLOWUP:  Follow up with me in 1 week.  Follow with PMD as scheduled.  CONDITION AT DISCHARGE:  Stable.  BRIEF HISTORY AND HOSPITAL COURSE:  Mr. Hueston is an 82 year old male with past medical history significant for hypertension, hyperlipidemia, hypothyroidism, depression, history of bronchial asthma, history of questionable temporal arteritis, history of herpes in the past.  He came to the ER from PMD's office complaining of retrosternal chest pain described as heaviness grade 7/10 which started few days ago and then last night, after working on his computer, he took granules of aspirin with relief of chest pain.  This morning, went to PMD's office.  EKG done showed Q-waves in inferior leads with T-wave inversion in inferolateral leads and was referred to ED for further treatment.  The patient denies any chest pain at present.  The patient was noted to have minimally elevated troponin I.  The patient was started on IV heparin in the ED.  Denies any such episodes of chest pain in the past.  Denies nausea, vomiting, diaphoresis.  Denies shortness of breath.  Denies palpitation, lightheadedness, or syncope.  PHYSICAL EXAMINATION:  GENERAL:  He was alert, awake, oriented x3 in  no acute distress. VITAL SIGNS:  Blood pressure was 128/77, pulse 78. EYES:  Conjunctivae were pink. NECK:  Supple.  No JVD.  No bruit. LUNGS:  Clear to auscultation without rhonchi, rales. CARDIOVASCULAR:  S1, S2 was normal.  There was soft systolic murmur noted. ABDOMEN:  Soft.  Bowel sounds were present.  Nontender. EXTREMITIES:  There was no clubbing, cyanosis, or edema.  LABORATORY DATA:  His sodium was 141, potassium 4.0, BUN 17, creatinine 1.23.  His troponin I were 0.11, 0.11, 0.10, 0.08, cholesterol was 147, triglycerides 55, HDL 49, LDL was 87.   His TSH was slightly elevated at 6.50.  Chest x-ray showed irregular opacity within the right mid lung field.  The patient subsequently had CT of the chest which showed no evidence of right mid lung field mass.  Although it showed some atelectasis with calcification in the medial lung base.  Postprocedure, his labs; sodium is 140, potassium 4.1, BUN 11, creatinine 1.38. Hemoglobin is 12.6, hematocrit 40.4, white count of 9.7.  EKG showed normal sinus rhythm with T-wave inversion in inferolateral leads, and nondiagnostic Q-waves in the inferior leads which appears to have improved.  BRIEF HOSPITAL COURSE:  The patient was admitted to step-down unit.  The patient did not have any anginal chest pain during the hospital stay. The patient subsequently underwent left cardiac cath with selective left and right coronary angiography and PTCA and stenting to RCA and LAD as per procedure report.  The patient tolerated the procedure well.  There were no complications.  Postprocedure, the patient did not have any episodes of anginal chest pain.  His groin is stable with no evidence of hematoma or bruit.  The patient is ambulating in the room without any problems.  His groin is stable.  The patient will be discharged home on above medications and will be followed up in my office in 1 week.  The patient will be scheduled for phase 2 cardiac rehab as an outpatient. The patient has been discussed at length regarding diet, lifestyle changes, compliance with medication.     Allegra Lai. Terrence Dupont, M.D.     MNH/MEDQ  D:  09/03/2015  T:  09/03/2015  Job:  [id number]

## 2015-09-03 NOTE — Discharge Summary (Signed)
Discharge summary dictated on 09/03/2015 dictation number is [phone]

## 2015-09-03 NOTE — Care Management Important Message (Signed)
Important Message  Patient Details  Name: Benjamin Hahn MRN: [id number] Date of Birth: 28-Jun-1933   Medicare Important Message Given:  Yes    Nathen May 09/03/2015, 4:07 PM

## 2015-09-03 NOTE — Discharge Instructions (Signed)
Coronary Angiogram With Stent Coronary angiography with stent placement is a procedure to widen or open a narrow blood vessel of the heart (coronary artery). When a coronary artery becomes partially blocked, it decreases blood flow to that area. This may lead to chest pain or a heart attack (myocardial infarction). Arteries may become blocked by cholesterol buildup (plaque) in the lining or wall.  A stent is a small piece of metal that looks like a mesh or a spring. Stent placement may be done right after a coronary angiography in which a blocked artery is found or as a treatment for a heart attack.  LET [facility] CARE PROVIDER KNOW ABOUT:  Any allergies you have.   All medicines you are taking, including vitamins, herbs, eye drops, creams, and over-the-counter medicines.   Previous problems you or members of your family have had with the use of anesthetics.   Any blood disorders you have.   Previous surgeries you have had.   Medical conditions you have. RISKS AND COMPLICATIONS Generally, coronary angiography with stent is a safe procedure. However, problems can occur and include:  Damage to the heart or its blood vessels.   A return of blockage.   Bleeding, infection, or bruising at the insertion site.   A collection of blood under the skin (hematoma) at the insertion site.  Blood clot in another part of the body.   Kidney injury.   Allergic reaction to the dye or contrast used.   Bleeding into the abdomen (retroperitoneal bleeding). BEFORE THE PROCEDURE  Do not eat or drink anything after midnight on the night before the procedure or as directed by your health care provider.  Ask your health care provider about changing or stopping your regular medicines. This is especially important if you are taking diabetes medicines or blood thinners.  Your health care provider will make sure you understand the procedure as well as the risks and potential problems  associated with the procedure.  PROCEDURE  You may be given a medicine to help you relax before and during the procedure (sedative). This medicine will be given through an IV tube that is put into one of your veins.   The area where the catheter will be inserted will be shaved and cleaned. This is usually done in the groin but may be done in the fold of your arm (near your elbow) or in the wrist.   A medicine will be given to numb the area where the catheter will be inserted (local anesthetic).   The catheter will be inserted into an artery using a guide wire. A type of X-ray (fluoroscopy) will be used to help guide the catheter to the opening of the blocked artery.   A dye will then be injected into the catheter, and X-rays will be taken. The dye will help to show where any narrowing or blockages are located in the heart arteries.   A tiny wire will be guided to the blocked spot, and a balloon will be inflated to make the artery wider. The stent will be expanded and will crush the plaque into the wall of the vessel. The stent will hold the area open like a scaffolding and improve the blood flow.   Sometimes the artery may be made wider using a laser or other tools to remove plaque.   When the blood flow is better, the catheter will be removed. The lining of the artery will grow over the stent, which stays where it was placed.  AFTER THE PROCEDURE °· If the procedure is done through the leg, you will be kept in bed lying flat for about 6 hours. You will be instructed to not bend or cross your legs.   °· The insertion site will be checked frequently.   °· The pulse in your feet or wrist will be checked frequently.   °· Additional blood tests, X-rays, and electrocardiography may be done. °  °This information is not intended to replace advice given to you by your health care provider. Make sure you discuss any questions you have with your health care provider. °  °Document Released:  02/14/2003 Document Revised: 08/31/2014 Document Reviewed: 01/02/2013 °Elsevier Interactive Patient Education ©2016 [employer] °Acute Coronary Syndrome °Acute coronary syndrome (ACS) is a serious problem in which there is suddenly not enough blood and oxygen supplied to the heart. ACS may mean that one or more of the blood vessels in your heart (coronary arteries) may be blocked. ACS can result in chest pain or a heart attack (myocardial infarction or MI). °CAUSES °This condition is caused by atherosclerosis, which is the buildup of fat and cholesterol (plaque) on the inside of the arteries. Over time, the plaque may narrow or block the artery, and this will lessen blood flow to the heart. Plaque can also become weak and break off within a coronary artery to form a clot and cause a sudden blockage. °RISK FACTORS °The risks factors of this condition include: °· High cholesterol levels. °· High blood pressure (hypertension). °· Smoking. °· Diabetes. °· Age. °· Family history of chest pain, heart disease, or stroke. °· Lack of exercise. °SYMPTOMS °The most common signs of this condition include: °· Chest pain, which can be: °¨ A crushing or squeezing in the chest. °¨ A tightness, pressure, fullness, or heaviness in the chest. °¨ Present for more than a few minutes, or it can stop and recur. °· Pain in the arms, neck, jaw, or back. °· Unexplained heartburn or indigestion. °· Shortness of breath. °· Nausea. °· Sudden cold sweats. °· Feeling light-headed or dizzy. °Sometimes, this condition has no symptoms. °DIAGNOSIS °ACS may be diagnosed through the following tests: °· Electrocardiogram (ECG). °· Blood tests. °· Coronary angiogram. This is a procedure to look at the coronary arteries to see if there is any blockage. °TREATMENT °Treatment for ACS may include: °· Healthy behavioral changes to reduce or control risk factors. °· Medicine. °· Coronary stenting. A stent helps to keep an artery open. °· Coronary  angioplasty. This procedure widens a narrowed or blocked artery. °· Coronary artery bypass surgery. This will allow your blood to pass the blockage (bypass) to reach your heart. °HOME CARE INSTRUCTIONS °Eating and Drinking °· Follow a heart-healthy diet. A dietitian can you help to educate you about healthy food options and changes. °· Use healthy cooking methods such as roasting, grilling, broiling, baking, poaching, steaming, or stir-frying. Talk to a dietitian to learn more about healthy cooking methods. °Medicines °· Take medicines only as directed by your health care provider. °· Do not take the following medicines unless your health care provider approves: °¨ Nonsteroidal anti-inflammatory drugs (NSAIDs), such as ibuprofen, naproxen, or celecoxib. °¨ Vitamin supplements that contain vitamin A, vitamin E, or both. °¨ Hormone replacement therapy that contains estrogen with or without progestin. °· Stop illegal drug use. °Activities °· Follow an exercise program that is approved by your health care provider. °· Plan rest periods when you are fatigued. °Lifestyle °· Do not use any tobacco products, including cigarettes, chewing tobacco,   or electronic cigarettes. If you need help quitting, ask your health care provider. °· If you drink alcohol, and your health care provider approves, limit your alcohol intake to no more than 1 drink per day. One drink equals 12 ounces of beer, 5 ounces of wine, or 1½ ounces of hard liquor. °· Learn to manage stress. °· Maintain a healthy weight. Lose weight as approved by your health care provider. °General Instructions °· Manage other health conditions, such as hypertension and diabetes, as directed by your health care provider. °· Keep all follow-up visits as directed by your health care provider. This is important. °· Your health care provider may ask you to monitor your blood pressure. A blood pressure reading consists of a higher number over a lower number, such as 110 over  72, written as 110/72. Ideally, your blood pressure should be: °¨ Below 140/90 if you have no other medical conditions. °¨ Below 130/80 if you have diabetes or kidney disease. °SEEK IMMEDIATE MEDICAL CARE IF: °· You have pain in your chest, neck, arm, jaw, stomach, or back that lasts more than a few minutes, is recurring, or is not relieved by taking medicine under your tongue (sublingual nitroglycerin). °· You have profuse sweating without cause. °· You have unexplained: °¨ Heartburn or indigestion. °¨ Shortness of breath or difficulty breathing. °¨ Nausea or vomiting. °¨ Fatigue. °¨ Feelings of nervousness or anxiety. °¨ Weakness. °¨ Diarrhea. °· You have sudden light-headedness or dizziness. °· You faint. °These symptoms may represent a serious problem that is an emergency. Do not wait to see if the symptoms will go away. Get medical help right away. Call your local emergency services (911 in the [place] Do not drive yourself to the clinic or hospital. °  °This information is not intended to replace advice given to you by your health care provider. Make sure you discuss any questions you have with your health care provider. °  °Document Released: 08/10/2005 Document Revised: 08/31/2014 Document Reviewed: 12/12/2013 °Elsevier Interactive Patient Education ©2016 Elsevier Inc. ° °

## 2015-09-03 NOTE — Progress Notes (Signed)
CARDIAC REHAB PHASE I   PRE:  Rate/Rhythm: 84 SR    BP: sitting 108/56    SaO2: 95 RA  MODE:  Ambulation: 300 ft   POST:  Rate/Rhythm: 83 SR    BP: sitting 126/54     SaO2:   Pt very stiff and sore getting out of bed. Had not been out of bed since Friday per pt due to being on bed alarm, [facility] nursing would not let him up on previous unit. C/o stiff back and hips and weak thighs. Able to walk independently however slowly. To recliner after walk, VSS. Ed completed. Very interested in [place] and will send referral to [place]. Good reception although wife not present so would benefit from reiteration with wife before d/c.  [phone]   Darrick Meigs CES, ACSM 09/03/2015 9:34 AM

## 2015-09-06 DIAGNOSIS — E039 Hypothyroidism, unspecified: Secondary | ICD-10-CM | POA: Diagnosis not present

## 2015-09-06 DIAGNOSIS — I2119 ST elevation (STEMI) myocardial infarction involving other coronary artery of inferior wall: Secondary | ICD-10-CM | POA: Diagnosis not present

## 2015-09-06 DIAGNOSIS — J45909 Unspecified asthma, uncomplicated: Secondary | ICD-10-CM | POA: Diagnosis not present

## 2015-09-06 DIAGNOSIS — E785 Hyperlipidemia, unspecified: Secondary | ICD-10-CM | POA: Diagnosis not present

## 2015-09-06 DIAGNOSIS — I10 Essential (primary) hypertension: Secondary | ICD-10-CM | POA: Diagnosis not present

## 2015-09-06 DIAGNOSIS — I2510 Atherosclerotic heart disease of native coronary artery without angina pectoris: Secondary | ICD-10-CM | POA: Diagnosis not present

## 2015-09-11 DIAGNOSIS — I2119 ST elevation (STEMI) myocardial infarction involving other coronary artery of inferior wall: Secondary | ICD-10-CM | POA: Diagnosis not present

## 2015-09-11 DIAGNOSIS — I10 Essential (primary) hypertension: Secondary | ICD-10-CM | POA: Diagnosis not present

## 2015-09-11 DIAGNOSIS — E039 Hypothyroidism, unspecified: Secondary | ICD-10-CM | POA: Diagnosis not present

## 2015-09-11 DIAGNOSIS — J040 Acute laryngitis: Secondary | ICD-10-CM | POA: Diagnosis not present

## 2015-09-11 DIAGNOSIS — E785 Hyperlipidemia, unspecified: Secondary | ICD-10-CM | POA: Diagnosis not present

## 2015-09-11 DIAGNOSIS — I2510 Atherosclerotic heart disease of native coronary artery without angina pectoris: Secondary | ICD-10-CM | POA: Diagnosis not present

## 2015-09-25 ENCOUNTER — Ambulatory Visit: Payer: Self-pay | Admitting: Neurology

## 2015-09-26 ENCOUNTER — Ambulatory Visit (HOSPITAL_COMMUNITY): Payer: Self-pay

## 2015-09-26 DIAGNOSIS — I252 Old myocardial infarction: Secondary | ICD-10-CM | POA: Diagnosis not present

## 2015-09-26 DIAGNOSIS — I2510 Atherosclerotic heart disease of native coronary artery without angina pectoris: Secondary | ICD-10-CM | POA: Diagnosis not present

## 2015-09-26 DIAGNOSIS — I10 Essential (primary) hypertension: Secondary | ICD-10-CM | POA: Diagnosis not present

## 2015-09-26 DIAGNOSIS — E785 Hyperlipidemia, unspecified: Secondary | ICD-10-CM | POA: Diagnosis not present

## 2015-09-28 ENCOUNTER — Emergency Department (INDEPENDENT_AMBULATORY_CARE_PROVIDER_SITE_OTHER)
Admission: EM | Admit: 2015-09-28 | Discharge: 2015-09-28 | Disposition: A | Discharge status: Home / Self Care | Payer: Medicare Other | Source: Home / Self Care | Attending: Family Medicine | Admitting: Family Medicine

## 2015-09-28 ENCOUNTER — Encounter (HOSPITAL_COMMUNITY): Payer: Self-pay | Admitting: Emergency Medicine

## 2015-09-28 DIAGNOSIS — I959 Hypotension, unspecified: Secondary | ICD-10-CM | POA: Diagnosis not present

## 2015-09-28 DIAGNOSIS — L089 Local infection of the skin and subcutaneous tissue, unspecified: Secondary | ICD-10-CM

## 2015-09-28 MED ORDER — CEPHALEXIN 500 MG PO CAPS: 500.0000 mg | ORAL_CAPSULE | Freq: Two times a day (BID) | ORAL | Status: DC

## 2015-09-28 MED ORDER — NAPROXEN 375 MG PO TABS: 375.0000 mg | ORAL_TABLET | Freq: Two times a day (BID) | ORAL | Status: DC | PRN

## 2015-09-28 NOTE — ED Notes (Signed)
Pt here with possible right foot 2nd toe paronychia that appeared one week ago Redness, swelling and oozing that started today noted Denies fever,chills

## 2015-09-28 NOTE — ED Provider Notes (Signed)
CSN: [id number]     Arrival date & time 09/28/15  1308 History   First MD Initiated Contact with Patient 09/28/15 1401     Chief Complaint  Patient presents with  . Nail Problem   (Consider location/radiation/quality/duration/timing/severity/associated sxs/prior Treatment) HPI  Nail problem: Patient c/o right 2nd toe swelling, redness and pain for 1 week. He went to [facility] nail today [place] to get it trimmed and pus started coming from his toe. No fever. Denies previous problem with his toes. Denies any injury to his toe prior to today. Hypotension: Patient started he was recently discharged from the hospital for MI s/p stent placement and he was started Metoprolol 25 mg qd and ramipril 1.25mg . His BP is low today. He stated he feels a little tired but otherwise fine.   Past Medical History  Diagnosis Date  . Hypothyroid   . Depression 1990s  . Shingles May 2014    "Right face, still has some"  . Hypercholesteremia     controled  . Anxiety   . Transient blindness of both eyes   . GERD (gastroesophageal reflux disease)     occasional  . Arthritis     might be in back, no problems  . Temporal arteritis ([place])   . BPH (benign prostatic hyperplasia)    Past Surgical History  Procedure Laterality Date  . None    . No past surgeries    . Artery biopsy Right 06/20/2013    Procedure: BIOPSY TEMPORAL ARTERY RIGHT;  Surgeon: Earnstine Regal, MD;  Location: [facility];  Service: General;  Laterality: Right;  . Cardiac catheterization N/A 09/02/2015    Procedure: Left Heart Cath and Coronary Angiography;  Surgeon: Charolette Forward, MD;  Location: [place] [facility];  Service: Cardiovascular;  Laterality: N/A;  . Cardiac catheterization N/A 09/02/2015    Procedure: Coronary Stent Intervention;  Surgeon: Charolette Forward, MD;  Location: [place] [facility];  Service: Cardiovascular;  Laterality: N/A;  1.  mid RCA      (3.0/28mm Xience) 2.  Mid LAD      (3.0/23mm Xience)   Family  History  Problem Relation Age of Onset  . Stroke Father     Died, 80s  . Stroke Sister     Living, 85  . Heart disease Mother     Died, 9  . Healthy Daughter   . Diabetes Maternal Grandmother   . Hypertension Neg Hx    Social History  Substance Use Topics  . Smoking status: Former Smoker    Types: Cigarettes    Quit date: 08/24/1966  . Smokeless tobacco: Never Used  . Alcohol Use: 1.2 oz/week    2 Shots of liquor per week     Comment: socially    Review of Systems  Respiratory: Negative.   Cardiovascular: Negative.   Skin:       Right 2nd toe redness and pain.  All other systems reviewed and are negative.  Filed Vitals:   09/28/15 1401  BP: 104/50  Pulse: 71  Temp: 98.2 F (36.8 C)  TempSrc: Oral  SpO2: 98%    Allergies  Review of patient's allergies indicates no known allergies.  Home Medications   Prior to Admission medications   Medication Sig Start Date End Date Taking? Authorizing Provider  acetaminophen (TYLENOL) 500 MG tablet Take 500 mg by mouth daily as needed (pain).    Historical Provider, MD  aspirin 81 MG chewable tablet Chew 1 tablet (81 mg total)  by mouth daily. 09/03/15   Charolette Forward, MD  atorvastatin (LIPITOR) 40 MG tablet Take 1 tablet (40 mg total) by mouth daily at 6 PM. 09/03/15   Charolette Forward, MD  buPROPion (WELLBUTRIN SR) 100 MG 12 hr tablet Take 1 tablet (100 mg total) by mouth every morning. Patient taking differently: Take 100 mg by mouth daily with breakfast.  08/09/15   Norma Fredrickson, MD  clopidogrel (PLAVIX) 75 MG tablet Take 1 tablet (75 mg total) by mouth daily with breakfast. 09/03/15   Charolette Forward, MD  imipramine (TOFRANIL) 10 MG tablet Take 10 mg by mouth at bedtime. 08/04/15   Historical Provider, MD  levothyroxine (SYNTHROID, LEVOTHROID) 88 MCG tablet TAKE 1 TABLET ONCE DAILY. Patient taking differently: TAKE 1 TABLET ONCE DAILY WITH BREAKFAST 03/29/15   Elayne Snare, MD  metoprolol succinate (TOPROL-XL) 25 MG 24 hr tablet  Take 1 tablet (25 mg total) by mouth daily. 08/30/15   Elayne Snare, MD  nitroGLYCERIN (NITROSTAT) 0.4 MG SL tablet Place 1 tablet (0.4 mg total) under the tongue every 5 (five) minutes as needed for chest pain (CP or SOB). 09/03/15   Charolette Forward, MD  ramipril (ALTACE) 1.25 MG capsule Take 1 capsule (1.25 mg total) by mouth daily. 09/03/15   Charolette Forward, MD  tamsulosin (FLOMAX) 0.4 MG CAPS capsule TAKE (1) CAPSULE DAILY. Patient taking differently: TAKE (2) CAPSULES  DAILY AFTER SUPPER 04/17/14   Elayne Snare, MD   Meds Ordered and Administered this Visit  Medications - No data to display  BP 104/50 mmHg  Pulse 71  Temp(Src) 98.2 F (36.8 C) (Oral)  SpO2 98% No data found.   Physical Exam  Constitutional: He appears well-developed. No distress.  Cardiovascular: Normal rate, regular rhythm and normal heart sounds.   No murmur heard. Pulmonary/Chest: Effort normal and breath sounds normal. No respiratory distress. He has no wheezes.  Skin:     Nursing note and vitals reviewed.   ED Course  Procedures (including critical care time)  Labs Review Labs Reviewed - No data to display  Imaging Review No results found.   Visual Acuity Review  Right Eye Distance:   Left Eye Distance:   Bilateral Distance:    Right Eye Near:   Left Eye Near:    Bilateral Near:         MDM  Toe infection  Hypotension, unspecified hypotension type   Patent likely got pus drained from getting toe nail cut hence I&D not required during this visit. Keflex given  As well as Naproxen prn pain. I recommended F/U with his PCP in 2-7 days or sooner if symptoms worsen.  BP low. He was recently started on metoprolol 25 mg qd and Ramipril 1.25 mg qd. i recommended cutting back on his Coreg but he stated he does not want to mess with his heart medication for now given he was recently discharged from the hospital. I then advised him to contact his cardiologist's office first thing tomorrow morning or go  to the ED if having any symptoms. He agreed with plan.    Kinnie Feil, MD 09/28/15 1438

## 2015-09-28 NOTE — Discharge Instructions (Signed)
It was nice seeing you. It seems you have infection of your toe. Please avoid using instruments such as blade to trim nail for now till infection is cleared. See your PCP in 2-7 days for follow up. Also contact your cardiologist's office tomorrow to discuss your BP medication since your pressure is low today. If feeling weak or tired please go to the emergency department.

## 2015-10-03 ENCOUNTER — Telehealth: Payer: Self-pay | Admitting: Endocrinology

## 2015-10-03 ENCOUNTER — Ambulatory Visit (HOSPITAL_COMMUNITY): Payer: Self-pay

## 2015-10-03 ENCOUNTER — Encounter: Payer: Self-pay | Admitting: Endocrinology

## 2015-10-03 ENCOUNTER — Ambulatory Visit (INDEPENDENT_AMBULATORY_CARE_PROVIDER_SITE_OTHER): Payer: Medicare Other | Admitting: Endocrinology

## 2015-10-03 VITALS — BP 118/64 | HR 70 | Temp 97.9°F | Resp 14 | Wt 192.0 lb

## 2015-10-03 DIAGNOSIS — J209 Acute bronchitis, unspecified: Secondary | ICD-10-CM | POA: Diagnosis not present

## 2015-10-03 DIAGNOSIS — I249 Acute ischemic heart disease, unspecified: Secondary | ICD-10-CM | POA: Diagnosis not present

## 2015-10-03 MED ORDER — PREDNISONE 10 MG PO TABS: ORAL_TABLET | ORAL | Status: DC

## 2015-10-03 MED ORDER — HYDROCOD POLST-CPM POLST ER 10-8 MG/5ML PO SUER: 5.0000 mL | Freq: Two times a day (BID) | ORAL | Status: DC | PRN

## 2015-10-03 NOTE — Telephone Encounter (Signed)
The rx for the tussinex has to be written it must be the hard copy please

## 2015-10-03 NOTE — Telephone Encounter (Signed)
I contacted the pt and advised for him to come back and pick the rx up to take with him to the pharmacy.

## 2015-10-03 NOTE — Progress Notes (Signed)
Subjective:     Patient ID: Benjamin Hahn, male   DOB: [birth year], 82 y.o.   MRN: [id number]  Cough This is a new (He tends to have periodic episodes of bronchitis) problem. The current episode started in the past 7 days (2 days ago). The problem has been unchanged. The problem occurs constantly (Mostly at night). Cough characteristics: Clear sputum. Pertinent negatives include no chest pain, chills or shortness of breath. Associated symptoms comments: No fever.  Some wheezing present. Nothing aggravates the symptoms.     Review of Systems  Constitutional: Negative for chills.  Respiratory: Positive for cough. Negative for shortness of breath.   Cardiovascular: Negative for chest pain.       Objective:   Physical Exam  HENT:  Mouth/Throat: No oropharyngeal exudate.  Pulmonary/Chest: No respiratory distress. He has wheezes. He has no rales.  Normal percussion per Scattered mild occasional wheeze heard       Assessment:     Probable viral bronchitis with mild bronchospasm    Plan:     Since patient wants to try a prescription, medication he will be given Tussionex 1 teaspoon at bedtime Also low-dose prednisone for the next 8 days

## 2015-10-07 ENCOUNTER — Ambulatory Visit (HOSPITAL_COMMUNITY): Payer: Self-pay

## 2015-10-08 ENCOUNTER — Other Ambulatory Visit: Payer: Self-pay | Admitting: *Deleted

## 2015-10-08 ENCOUNTER — Telehealth: Payer: Self-pay | Admitting: *Deleted

## 2015-10-08 MED ORDER — HYDROCOD POLST-CPM POLST ER 10-8 MG/5ML PO SUER: 5.0000 mL | Freq: Two times a day (BID) | ORAL | Status: DC | PRN

## 2015-10-08 MED ORDER — AZITHROMYCIN 250 MG PO TABS: ORAL_TABLET | ORAL | Status: DC

## 2015-10-08 NOTE — Telephone Encounter (Signed)
Refill cough medication as needed, previously used Tussionex , Zithromax for 5 days

## 2015-10-08 NOTE — Telephone Encounter (Signed)
Benjamin Hahn called, he said he's been suffering from a horrible cough since last week, he's not able to sleep because it keeps him up, he also said he's coughing up a dark gray color phlegm. He would like some medicine to be sent in for the cough and maybe an antibiotic? Please advise

## 2015-10-09 ENCOUNTER — Ambulatory Visit (HOSPITAL_COMMUNITY): Payer: Self-pay

## 2015-10-09 NOTE — Telephone Encounter (Signed)
Noted, rx sent, patient is aware. 

## 2015-10-11 ENCOUNTER — Ambulatory Visit (HOSPITAL_COMMUNITY): Payer: Self-pay

## 2015-10-14 ENCOUNTER — Ambulatory Visit (HOSPITAL_COMMUNITY): Payer: Self-pay

## 2015-10-16 ENCOUNTER — Ambulatory Visit (HOSPITAL_COMMUNITY): Payer: Self-pay

## 2015-10-17 ENCOUNTER — Telehealth (HOSPITAL_COMMUNITY): Payer: Self-pay

## 2015-10-17 NOTE — Telephone Encounter (Signed)
Medication refill request - Fax received from [facility] for refill request for patient's Alprazolam.  ED note from 09/03/15 reflect this a taking 0.5 mg, one at bedtime but was discontinued that date. Patient's next evaluation set for 11/29/15.

## 2015-10-18 ENCOUNTER — Ambulatory Visit (HOSPITAL_COMMUNITY): Payer: Self-pay

## 2015-10-18 ENCOUNTER — Other Ambulatory Visit (HOSPITAL_COMMUNITY): Payer: Self-pay | Admitting: Psychiatry

## 2015-10-21 ENCOUNTER — Ambulatory Visit (HOSPITAL_COMMUNITY): Payer: Self-pay

## 2015-10-21 ENCOUNTER — Other Ambulatory Visit: Payer: Self-pay | Admitting: Endocrinology

## 2015-10-23 ENCOUNTER — Ambulatory Visit (HOSPITAL_COMMUNITY): Payer: Self-pay

## 2015-10-25 ENCOUNTER — Ambulatory Visit (HOSPITAL_COMMUNITY): Payer: Self-pay

## 2015-10-25 ENCOUNTER — Telehealth (HOSPITAL_COMMUNITY): Payer: Self-pay

## 2015-10-25 DIAGNOSIS — F331 Major depressive disorder, recurrent, moderate: Secondary | ICD-10-CM

## 2015-10-25 MED ORDER — ALPRAZOLAM 0.5 MG PO TABS: ORAL_TABLET | ORAL | Status: DC

## 2015-10-25 NOTE — Telephone Encounter (Signed)
Okay to refill his medication is Xanax. Given him enough until her next visit.

## 2015-10-25 NOTE — Telephone Encounter (Signed)
Patient called about his Xanax and I saw that Dr. Casimiro Needle had sent an approval for one month to RN Shawn so I called it in. I called the patient and let him know.

## 2015-10-28 ENCOUNTER — Ambulatory Visit (HOSPITAL_COMMUNITY): Payer: Self-pay

## 2015-10-30 ENCOUNTER — Ambulatory Visit (HOSPITAL_COMMUNITY): Payer: Self-pay

## 2015-11-01 ENCOUNTER — Ambulatory Visit (HOSPITAL_COMMUNITY): Payer: Self-pay

## 2015-11-04 ENCOUNTER — Ambulatory Visit (HOSPITAL_COMMUNITY): Payer: Self-pay

## 2015-11-05 DIAGNOSIS — E291 Testicular hypofunction: Secondary | ICD-10-CM | POA: Diagnosis not present

## 2015-11-06 ENCOUNTER — Ambulatory Visit (HOSPITAL_COMMUNITY): Payer: Self-pay

## 2015-11-08 ENCOUNTER — Ambulatory Visit (HOSPITAL_COMMUNITY): Payer: Self-pay

## 2015-11-11 ENCOUNTER — Ambulatory Visit (HOSPITAL_COMMUNITY): Payer: Self-pay

## 2015-11-13 ENCOUNTER — Ambulatory Visit (HOSPITAL_COMMUNITY): Payer: Self-pay

## 2015-11-15 ENCOUNTER — Ambulatory Visit (HOSPITAL_COMMUNITY): Payer: Self-pay

## 2015-11-18 ENCOUNTER — Ambulatory Visit (HOSPITAL_COMMUNITY): Payer: Self-pay

## 2015-11-20 ENCOUNTER — Ambulatory Visit (HOSPITAL_COMMUNITY): Payer: Self-pay

## 2015-11-20 DIAGNOSIS — N401 Enlarged prostate with lower urinary tract symptoms: Secondary | ICD-10-CM | POA: Diagnosis not present

## 2015-11-20 DIAGNOSIS — Z0000 Encounter for general adult medical examination without abnormal findings: Secondary | ICD-10-CM | POA: Diagnosis not present

## 2015-11-22 ENCOUNTER — Ambulatory Visit (HOSPITAL_COMMUNITY): Payer: Self-pay

## 2015-11-22 DIAGNOSIS — I252 Old myocardial infarction: Secondary | ICD-10-CM | POA: Diagnosis not present

## 2015-11-22 DIAGNOSIS — E039 Hypothyroidism, unspecified: Secondary | ICD-10-CM | POA: Diagnosis not present

## 2015-11-22 DIAGNOSIS — I10 Essential (primary) hypertension: Secondary | ICD-10-CM | POA: Diagnosis not present

## 2015-11-22 DIAGNOSIS — J45909 Unspecified asthma, uncomplicated: Secondary | ICD-10-CM | POA: Diagnosis not present

## 2015-11-22 DIAGNOSIS — M1990 Unspecified osteoarthritis, unspecified site: Secondary | ICD-10-CM | POA: Diagnosis not present

## 2015-11-22 DIAGNOSIS — E785 Hyperlipidemia, unspecified: Secondary | ICD-10-CM | POA: Diagnosis not present

## 2015-11-22 DIAGNOSIS — I2510 Atherosclerotic heart disease of native coronary artery without angina pectoris: Secondary | ICD-10-CM | POA: Diagnosis not present

## 2015-11-25 ENCOUNTER — Ambulatory Visit (HOSPITAL_COMMUNITY): Payer: Self-pay

## 2015-11-27 ENCOUNTER — Ambulatory Visit (HOSPITAL_COMMUNITY): Payer: Self-pay

## 2015-11-29 ENCOUNTER — Ambulatory Visit (INDEPENDENT_AMBULATORY_CARE_PROVIDER_SITE_OTHER): Payer: Medicare Other | Admitting: Psychiatry

## 2015-11-29 ENCOUNTER — Ambulatory Visit (HOSPITAL_COMMUNITY): Payer: Self-pay

## 2015-11-29 VITALS — BP 118/60 | HR 64 | Ht 68.0 in | Wt 191.0 lb

## 2015-11-29 DIAGNOSIS — F331 Major depressive disorder, recurrent, moderate: Secondary | ICD-10-CM | POA: Diagnosis not present

## 2015-11-29 DIAGNOSIS — F325 Major depressive disorder, single episode, in full remission: Secondary | ICD-10-CM

## 2015-11-29 MED ORDER — ALPRAZOLAM 0.5 MG PO TABS: ORAL_TABLET | ORAL | Status: DC

## 2015-11-29 MED ORDER — IMIPRAMINE HCL 10 MG PO TABS: 10.0000 mg | ORAL_TABLET | Freq: Every day | ORAL | Status: DC

## 2015-11-29 MED ORDER — BUPROPION HCL ER (XL) 150 MG PO TB24: 150.0000 mg | ORAL_TABLET | ORAL | Status: DC

## 2015-11-29 MED ORDER — VENLAFAXINE HCL ER 75 MG PO CP24: 75.0000 mg | ORAL_CAPSULE | Freq: Every day | ORAL | Status: DC

## 2015-11-29 NOTE — Progress Notes (Signed)
BH MD/PA/NP OP Progress Note  11/29/2015 9:51 AM PRINCESTON CHAPMON  MRN:  [id number]  Chief Complaint:  Subjective: Doing great HPI: At this time the patient is doing well. Since I've seen and however he's had a myocardial infarction and had 2 stents put in place. He no longer has chest pain but he clearly had a great deal of chest pain when he was admitted to the hospital. In the hospital for reasons that are not clear they discontinued his Effexor yet continued his Wellbutrin low-dose. The patient never stopped his imipramine as ordered last time in our office. Today the patient denies daily depression. 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 This is somewhat of early the patient says he still is financially  Very stressed and needs to keep working despite the fact that he's just turned 6. The patient says that he is sleeping and eating well. He's got good energy. He does a minimal amount of things for enjoyment. He does still enjoy music goes to a few movies watch some TV. He also loves sports. The patient drinks 2 drinks every night. This seems to be stable for him. He is a good relationship with his wife. His kids are all coming in for Passover. The patient no longer complains of being oversedated in the morning he is trying to exercise a few times during the week. Patient issues are to deal with her tired. He doesn't know how exactly to do it. Fortunately did not experience any depression despite his cardiovascular events. The patient denies being suicidal.   ICD-9-CM ICD-10-CM   1. Major depressive disorder, recurrent episode, moderate (HCC) 296.32 F33.1 ALPRAZolam (XANAX) 0.5 MG tablet     DISCONTINUED: ALPRAZolam (XANAX) 0.5 MG tablet  Diagnosis : Major depression chronic, recurrent  Past Psychiatric History:   Past Medical History:  Past Medical History  Diagnosis Date  . Hypothyroid   . Depression 1990s  . Shingles May 2014    "Right face, still has some"  . Hypercholesteremia      controled  . Anxiety   . Transient blindness of both eyes   . GERD (gastroesophageal reflux disease)     occasional  . Arthritis     might be in back, no problems  . Temporal arteritis ([place])   . BPH (benign prostatic hyperplasia)     Past Surgical History  Procedure Laterality Date  . None    . No past surgeries    . Artery biopsy Right 06/20/2013    Procedure: BIOPSY TEMPORAL ARTERY RIGHT;  Surgeon: Earnstine Regal, MD;  Location: [facility];  Service: General;  Laterality: Right;  . Cardiac catheterization N/A 09/02/2015    Procedure: Left Heart Cath and Coronary Angiography;  Surgeon: Charolette Forward, MD;  Location: [place] [facility];  Service: Cardiovascular;  Laterality: N/A;  . Cardiac catheterization N/A 09/02/2015    Procedure: Coronary Stent Intervention;  Surgeon: Charolette Forward, MD;  Location: [place] [facility];  Service: Cardiovascular;  Laterality: N/A;  1.  mid RCA      (3.0/28mm Xience) 2.  Mid LAD      (3.0/23mm Xience)    Family Psychiatric History:   Family History:  Family History  Problem Relation Age of Onset  . Stroke Father     Died, 80s  . Stroke Sister     Living, 75  . Heart disease Mother     Died, 73  . Healthy Daughter   . Diabetes Maternal Grandmother   .  Hypertension Neg Hx     Social History:  Social History   Social History  . Marital Status: Married    Spouse Name: N/A  . Number of Children: N/A  . Years of Education: N/A   Social History Main Topics  . Smoking status: Former Smoker    Types: Cigarettes    Quit date: 08/24/1966  . Smokeless tobacco: Never Used  . Alcohol Use: 1.2 oz/week    2 Shots of liquor per week     Comment: socially  . Drug Use: No  . Sexual Activity: Not on file   Other Topics Concern  . Not on file   Social History Narrative   Currently works as a Midwife.  Lives with wife and they have two healthy daughters.    Allergies: No Known Allergies  Metabolic Disorder Labs: Lab Results  Component  Value Date   HGBA1C 6.1 06/01/2013   No results found for: PROLACTIN Lab Results  Component Value Date   CHOL 147 08/31/2015   TRIG 55 08/31/2015   HDL 49 08/31/2015   CHOLHDL 3.0 08/31/2015   VLDL 11 08/31/2015   LDLCALC 87 08/31/2015   LDLCALC 150* 04/30/2015     Current Medications: Current Outpatient Prescriptions  Medication Sig Dispense Refill  . acetaminophen (TYLENOL) 500 MG tablet Take 500 mg by mouth daily as needed (pain).    [name] ALPRAZolam (XANAX) 0.5 MG tablet 1-2 tablets by mouth at bedtime as needed 30 tablet 4  . amoxicillin-clavulanate (AUGMENTIN) 875-125 MG tablet   0  . aspirin 81 MG chewable tablet Chew 1 tablet (81 mg total) by mouth daily. 30 tablet 3  . atorvastatin (LIPITOR) 40 MG tablet Take 1 tablet (40 mg total) by mouth daily at 6 PM. 30 tablet 3  . azithromycin (ZITHROMAX Z-PAK) 250 MG tablet Take 2 tablets the first day, then take 1 tablet daily until gone. 6 each 0  . buPROPion (WELLBUTRIN SR) 100 MG 12 hr tablet Take 1 tablet (100 mg total) by mouth every morning. (Patient taking differently: Take 100 mg by mouth daily with breakfast. ) 30 tablet 5  . buPROPion (WELLBUTRIN XL) 150 MG 24 hr tablet Take 1 tablet (150 mg total) by mouth every morning. 30 tablet 5  . cephALEXin (KEFLEX) 500 MG capsule Take 1 capsule (500 mg total) by mouth 2 (two) times daily. 14 capsule 0  . chlorpheniramine-HYDROcodone (TUSSIONEX PENNKINETIC ER) 10-8 MG/5ML SUER Take 5 mLs by mouth every 12 (twelve) hours as needed for cough. 60 mL 0  . clopidogrel (PLAVIX) 75 MG tablet Take 1 tablet (75 mg total) by mouth daily with breakfast. 30 tablet 3  . imipramine (TOFRANIL) 10 MG tablet Take 1 tablet (10 mg total) by mouth at bedtime. 60 tablet 4  . levothyroxine (SYNTHROID, LEVOTHROID) 88 MCG tablet TAKE 1 TABLET ONCE DAILY. 30 tablet 5  . metoprolol succinate (TOPROL-XL) 25 MG 24 hr tablet Take 1 tablet (25 mg total) by mouth daily. 30 tablet 3  . naproxen (NAPROSYN) 250 MG tablet    0  . naproxen (NAPROSYN) 375 MG tablet Take 1 tablet (375 mg total) by mouth 2 (two) times daily as needed for moderate pain. 15 tablet 0  . nitroGLYCERIN (NITROSTAT) 0.4 MG SL tablet Place 1 tablet (0.4 mg total) under the tongue every 5 (five) minutes as needed for chest pain (CP or SOB). 25 tablet 12  . predniSONE (DELTASONE) 10 MG tablet Take 3 tablets daily for 2 days, then 2  tablet daily for 3 days and then 1 tab for 3 days. 7 tablet 0  . ramipril (ALTACE) 1.25 MG capsule Take 1 capsule (1.25 mg total) by mouth daily. 30 capsule 3  . tamsulosin (FLOMAX) 0.4 MG CAPS capsule TAKE (1) CAPSULE DAILY. (Patient taking differently: TAKE (2) CAPSULES  DAILY AFTER SUPPER) 30 capsule 3  . venlafaxine XR (EFFEXOR XR) 75 MG 24 hr capsule Take 1 capsule (75 mg total) by mouth daily. 30 capsule 5   No current facility-administered medications for this visit.    Neurologic: Headache: No Seizure: No Paresthesias: No  Musculoskeletal: Strength & Muscle Tone: within normal limits Gait & Station: normal Patient leans: NA  Psychiatric Specialty Exam: ROS  Blood pressure 118/60, pulse 64, height 5\' 8"  (1.727 m), weight 191 lb (86.637 kg).Body mass index is 29.05 kg/(m^2).  General Appearance: Fairly Groomed  Eye Contact:  Good  Speech:  Clear and Coherent  Volume:  Normal  Mood:  Euthymic  Affect:  Appropriate  Thought Process:  Coherent  Orientation:  Full (Time, Place, and Person)  Thought Content:  WDL  Suicidal Thoughts:  No  Homicidal Thoughts:  No  Memory:  NA  Judgement:  Good  Insight:  Good  Psychomotor Activity:  Normal  Concentration:  Good  Recall:  Good  Fund of Knowledge: Good  Language: Good  Akathisia:  No  Handed:  Right  AIMS (if indicated):    Assets:  Desire for Improvement  ADL's:  Intact  Cognition: WNL  Sleep:       Treatment Plan Summary:At this time the patient is doing fairly well. We had a long discussion of his medications. I shared with him that I  would rather not be on imipramine for long-term basis given its anticholinergic effects area on the other hand he denies sedation constipation or blurred vision. For now we'll continue it but we will make some changes. We will start him back on Effexor at a lower dose of 75 mg XR and changes Wellbutrin from 100 mg slow release to dose of 150 mg XL. He'll take Effexor and Wellbutrin in the morning. He'll continue taking Xanax 0.5 mg at night which helps him sleep. For now he'll continue low-dose imipramine 10 mg at night. At the incident significant dose and probably is no reason to make any changes with it. Today the patient did not come in with his wife. Is getting along well with her and seems to be a nonissue. The patient is doing well. He denies any chest pain these days. He has no shortness of breath. He is in pretty good shape. He denies any neurological symptoms.   Haskel Schroeder, MD 11/29/2015, 9:51 AM [facility] MD Progress Note  11/29/2015 9:52 AM [name]  MRN:  [id number] Subjective:  Feels well Principal Problem: Major depression, chronic, mild/residual Diagnosis:  Major depression, recurrent residual Today the patient is doing fairly well. He's been having some medical problems. He recently went for Thanksgiving to a length but unfortunately had a urinary tract infection. He also since then has been coughing and recently started on antibiotic. Emotionally he is fairly stable. He denies daily depression. He stays very active at work. He's been unable to exercise. The patient is sleeping well and has an increase in his appetite. His energy is good. Is no problems thinking or concentrating. He is recently having a productive cough but is no shortness of breath. He denies any chest pain. Is a stable relationship with  his wife. His kidneys are good. He has 4 grandchildren. Everybody in his home is good. He only issue is that a number mornings he awakens and he feels oversedated. It occurs  point of his wife feels uncomfortable with him driving will taken to work. Today reviewed his medications and is evident that he likely needs reduction. He also notes that his memory has change in his and is good. 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 Patient Active Problem List   Diagnosis Date Noted  . Acute coronary syndrome ([place]) [I24.9] 08/30/2015  . Temporal arteritis ([place]) [M31.6] 06/16/2013  . Amaurosis fugax [G45.3] 06/01/2013  . Unspecified hypothyroidism [E03.9] 03/14/2013  . Other and unspecified hyperlipidemia [E78.5] 03/14/2013  . BPH (benign prostatic hypertrophy) [N40.0] 03/14/2013  . Hypogonadism, male [E29.1] 03/14/2013  . Erectile dysfunction [N52.9] 03/14/2013   Total Time spent with patient: 30 minutes  Past Psychiatric History:   Past Medical History:  Past Medical History  Diagnosis Date  . Hypothyroid   . Depression 1990s  . Shingles May 2014    "Right face, still has some"  . Hypercholesteremia     controled  . Anxiety   . Transient blindness of both eyes   . GERD (gastroesophageal reflux disease)     occasional  . Arthritis     might be in back, no problems  . Temporal arteritis ([place])   . BPH (benign prostatic hyperplasia)     Past Surgical History  Procedure Laterality Date  . None    . No past surgeries    . Artery biopsy Right 06/20/2013    Procedure: BIOPSY TEMPORAL ARTERY RIGHT;  Surgeon: Earnstine Regal, MD;  [facility]: [facility];  Service: General;  Laterality: Right;  . Cardiac catheterization N/A 09/02/2015    Procedure: Left Heart Cath and Coronary Angiography;  Surgeon: Charolette Forward, MD;  Location: [place] [facility];  Service: Cardiovascular;  Laterality: N/A;  . Cardiac catheterization N/A 09/02/2015    Procedure: Coronary Stent Intervention;  Surgeon: Charolette Forward, MD;  Location: [place] [facility];  Service: Cardiovascular;  Laterality: N/A;  1.  mid RCA      (3.0/28mm Xience) 2.  Mid  LAD      (3.0/23mm Xience)   Family History:  Family History  Problem Relation Age of Onset  . Stroke Father     Died, 80s  . Stroke Sister     Living, 55  . Heart disease Mother     Died, 19  . Healthy Daughter   . Diabetes Maternal Grandmother   . Hypertension Neg Hx    Family Psychiatric  History:  Social History:  History  Alcohol Use  . 1.2 oz/week  . 2 Shots of liquor per week    Comment: socially     History  Drug Use No    Social History   Social History  . Marital Status: Married    Spouse Name: N/A  . Number of Children: N/A  . Years of Education: N/A   Social History Main Topics  . Smoking status: Former Smoker    Types: Cigarettes    Quit date: 08/24/1966  . Smokeless tobacco: Never Used  . Alcohol Use: 1.2 oz/week    2 Shots of liquor per week     Comment: socially  . Drug Use: No  . Sexual Activity: Not on file   Other Topics Concern  . Not on file  Social History Narrative   Currently works as a Midwife.  Lives with wife and they have two healthy daughters.   Additional Social History:                         Sleep: Good  Appetite:  Good  Current Medications: Current Outpatient Prescriptions  Medication Sig Dispense Refill  . acetaminophen (TYLENOL) 500 MG tablet Take 500 mg by mouth daily as needed (pain).    [name] ALPRAZolam (XANAX) 0.5 MG tablet 1-2 tablets by mouth at bedtime as needed 30 tablet 4  . amoxicillin-clavulanate (AUGMENTIN) 875-125 MG tablet   0  . aspirin 81 MG chewable tablet Chew 1 tablet (81 mg total) by mouth daily. 30 tablet 3  . atorvastatin (LIPITOR) 40 MG tablet Take 1 tablet (40 mg total) by mouth daily at 6 PM. 30 tablet 3  . azithromycin (ZITHROMAX Z-PAK) 250 MG tablet Take 2 tablets the first day, then take 1 tablet daily until gone. 6 each 0  . buPROPion (WELLBUTRIN SR) 100 MG 12 hr tablet Take 1 tablet (100 mg total) by mouth every morning. (Patient taking differently: Take 100 mg by mouth  daily with breakfast. ) 30 tablet 5  . buPROPion (WELLBUTRIN XL) 150 MG 24 hr tablet Take 1 tablet (150 mg total) by mouth every morning. 30 tablet 5  . cephALEXin (KEFLEX) 500 MG capsule Take 1 capsule (500 mg total) by mouth 2 (two) times daily. 14 capsule 0  . chlorpheniramine-HYDROcodone (TUSSIONEX PENNKINETIC ER) 10-8 MG/5ML SUER Take 5 mLs by mouth every 12 (twelve) hours as needed for cough. 60 mL 0  . clopidogrel (PLAVIX) 75 MG tablet Take 1 tablet (75 mg total) by mouth daily with breakfast. 30 tablet 3  . imipramine (TOFRANIL) 10 MG tablet Take 1 tablet (10 mg total) by mouth at bedtime. 60 tablet 4  . levothyroxine (SYNTHROID, LEVOTHROID) 88 MCG tablet TAKE 1 TABLET ONCE DAILY. 30 tablet 5  . metoprolol succinate (TOPROL-XL) 25 MG 24 hr tablet Take 1 tablet (25 mg total) by mouth daily. 30 tablet 3  . naproxen (NAPROSYN) 250 MG tablet   0  . naproxen (NAPROSYN) 375 MG tablet Take 1 tablet (375 mg total) by mouth 2 (two) times daily as needed for moderate pain. 15 tablet 0  . nitroGLYCERIN (NITROSTAT) 0.4 MG SL tablet Place 1 tablet (0.4 mg total) under the tongue every 5 (five) minutes as needed for chest pain (CP or SOB). 25 tablet 12  . predniSONE (DELTASONE) 10 MG tablet Take 3 tablets daily for 2 days, then 2 tablet daily for 3 days and then 1 tab for 3 days. 7 tablet 0  . ramipril (ALTACE) 1.25 MG capsule Take 1 capsule (1.25 mg total) by mouth daily. 30 capsule 3  . tamsulosin (FLOMAX) 0.4 MG CAPS capsule TAKE (1) CAPSULE DAILY. (Patient taking differently: TAKE (2) CAPSULES  DAILY AFTER SUPPER) 30 capsule 3  . venlafaxine XR (EFFEXOR XR) 75 MG 24 hr capsule Take 1 capsule (75 mg total) by mouth daily. 30 capsule 5   No current facility-administered medications for this visit.    Lab Results: No results found for this or any previous visit (from the past 48 hour(s)).  Physical Findings: AIMS:  , ,  ,  ,    CIWA:    COWS:     Musculoskeletal: Strength & Muscle Tone: within  normal limits Gait & Station: normal Patient leans: Right  Psychiatric Specialty  Exam: ROS  Blood pressure 118/60, pulse 64, height 5\' 8"  (1.727 m), weight 191 lb (86.637 kg).Body mass index is 29.05 kg/(m^2).  General Appearance: Negative  Eye Contact::  Good  Speech:  Clear and Coherent  Volume:  Normal  Mood:  NA  Affect:  Appropriate  Thought Process:  Coherent  Orientation:  Full (Time, Place, and Person)  Thought Content:  WDL  Suicidal Thoughts:  No  Homicidal Thoughts:  No  Memory:  NA  Judgement:  Good  Insight:  Good  Psychomotor Activity:  Normal  Concentration:  Good  Recall:  Good  Fund of Knowledge:Good  Language: Good  Akathisia:  No  Handed:  Right  AIMS (if indicated):     Assets:  Desire for Improvement  ADL's:  Intact  Cognition: WNL  Sleep:      Treatment Plan Summary: At this time the patient will continue taking the Effexor 150 mg X are in the morning. Over the year we have increased it from 80 and he felt as distinct improvement. He'll continue taking a low dose of Wellbutrin 100 mg slow release. Today our intervention was to discontinue his imipramine. He was taking 10 mg 2 at night. I don't think this is a good agent for this individual giving his age and the fact that he wakes up a bit sedated. I do not think it will affect him emotionally. The patient also takes Xanax 0.5 mg patch reasonable. Most the time he takes it at night and helps him sleep. So the combination of Effexor Wellbutrin and a little bit of Xanax seems to help this individual a great deal. Is not suicidal. He drinks a fair amount only about 2 drinks a night. Is not had any falls. Generally he likes to workout but this time he's been sick with with what sounds like an upper respiratory tract infection. He just recently got over a bladder infection. This patient to return to see me in 3 months and he will bring his wife at that time.  Charlestine Night Loukas Antonson 11/29/2015, 9:52 AM [facility] MD  Progress Note  11/29/2015 9:55 AM [name]  MRN:  [id number] Subjective:  Feels well Principal Problem: Major depression, chronic, mild/residual Diagnosis:  Major depression, recurrent residual Today the patient is doing fairly well. He's been having some medical problems. He recently went for Thanksgiving to a length but unfortunately had a urinary tract infection. He also since then has been coughing and recently started on antibiotic. Emotionally he is fairly stable. He denies daily depression. He stays very active at work. He's been unable to exercise. The patient is sleeping well and has an increase in his appetite. His energy is good. Is no problems thinking or concentrating. He is recently having a productive cough but is no shortness of breath. He denies any chest pain. Is a stable relationship with his wife. His kidneys are good. He has 4 grandchildren. Everybody in his home is good. He only issue is that a number mornings he awakens and he feels oversedated. It occurs point of his wife feels uncomfortable with him driving will taken to work. Today reviewed his medications and is evident that he likely needs reduction. He also notes that his memory has change in his and is good. 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 Patient Active Problem List   Diagnosis Date Noted  . Acute coronary syndrome ([place]) [I24.9] 08/30/2015  . Temporal  arteritis ([place]) [M31.6] 06/16/2013  . Amaurosis fugax [G45.3] 06/01/2013  . Unspecified hypothyroidism [E03.9] 03/14/2013  . Other and unspecified hyperlipidemia [E78.5] 03/14/2013  . BPH (benign prostatic hypertrophy) [N40.0] 03/14/2013  . Hypogonadism, male [E29.1] 03/14/2013  . Erectile dysfunction [N52.9] 03/14/2013   Total Time spent with patient: 30 minutes  Past Psychiatric History:   Past Medical History:  Past Medical History  Diagnosis Date  . Hypothyroid   . Depression 1990s  .  Shingles May 2014    "Right face, still has some"  . Hypercholesteremia     controled  . Anxiety   . Transient blindness of both eyes   . GERD (gastroesophageal reflux disease)     occasional  . Arthritis     might be in back, no problems  . Temporal arteritis ([place])   . BPH (benign prostatic hyperplasia)     Past Surgical History  Procedure Laterality Date  . None    . No past surgeries    . Artery biopsy Right 06/20/2013    Procedure: BIOPSY TEMPORAL ARTERY RIGHT;  Surgeon: Earnstine Regal, MD;  [facility];  Service: General;  Laterality: Right;  . Cardiac catheterization N/A 09/02/2015    Procedure: Left Heart Cath and Coronary Angiography;  Surgeon: Charolette Forward, MD;  Location: [place] [facility];  Service: Cardiovascular;  Laterality: N/A;  . Cardiac catheterization N/A 09/02/2015    Procedure: Coronary Stent Intervention;  Surgeon: Charolette Forward, MD;  Location: [place] [facility];  Service: Cardiovascular;  Laterality: N/A;  1.  mid RCA      (3.0/28mm Xience) 2.  Mid LAD      (3.0/23mm Xience)   Family History:  Family History  Problem Relation Age of Onset  . Stroke Father     Died, 80s  . Stroke Sister     Living, 67  . Heart disease Mother     Died, 46  . Healthy Daughter   . Diabetes Maternal Grandmother   . Hypertension Neg Hx    Family Psychiatric  History:  Social History:  History  Alcohol Use  . 1.2 oz/week  . 2 Shots of liquor per week    Comment: socially     History  Drug Use No    Social History   Social History  . Marital Status: Married    Spouse Name: N/A  . Number of Children: N/A  . Years of Education: N/A   Social History Main Topics  . Smoking status: Former Smoker    Types: Cigarettes    Quit date: 08/24/1966  . Smokeless tobacco: Never Used  . Alcohol Use: 1.2 oz/week    2 Shots of liquor per week     Comment: socially  . Drug Use: No  . Sexual Activity: Not on file   Other Topics Concern  . Not on file   Social  History Narrative   Currently works as a Midwife.  Lives with wife and they have two healthy daughters.   Additional Social History:                         Sleep: Good  Appetite:  Good  Current Medications: Current Outpatient Prescriptions  Medication Sig Dispense Refill  . acetaminophen (TYLENOL) 500 MG tablet Take 500 mg by mouth daily as needed (pain).    [name] ALPRAZolam (XANAX) 0.5 MG tablet 1-2 tablets by mouth at bedtime as needed 30 tablet 4  . amoxicillin-clavulanate (  AUGMENTIN) 875-125 MG tablet   0  . aspirin 81 MG chewable tablet Chew 1 tablet (81 mg total) by mouth daily. 30 tablet 3  . atorvastatin (LIPITOR) 40 MG tablet Take 1 tablet (40 mg total) by mouth daily at 6 PM. 30 tablet 3  . azithromycin (ZITHROMAX Z-PAK) 250 MG tablet Take 2 tablets the first day, then take 1 tablet daily until gone. 6 each 0  . buPROPion (WELLBUTRIN SR) 100 MG 12 hr tablet Take 1 tablet (100 mg total) by mouth every morning. (Patient taking differently: Take 100 mg by mouth daily with breakfast. ) 30 tablet 5  . buPROPion (WELLBUTRIN XL) 150 MG 24 hr tablet Take 1 tablet (150 mg total) by mouth every morning. 30 tablet 5  . cephALEXin (KEFLEX) 500 MG capsule Take 1 capsule (500 mg total) by mouth 2 (two) times daily. 14 capsule 0  . chlorpheniramine-HYDROcodone (TUSSIONEX PENNKINETIC ER) 10-8 MG/5ML SUER Take 5 mLs by mouth every 12 (twelve) hours as needed for cough. 60 mL 0  . clopidogrel (PLAVIX) 75 MG tablet Take 1 tablet (75 mg total) by mouth daily with breakfast. 30 tablet 3  . imipramine (TOFRANIL) 10 MG tablet Take 1 tablet (10 mg total) by mouth at bedtime. 60 tablet 4  . levothyroxine (SYNTHROID, LEVOTHROID) 88 MCG tablet TAKE 1 TABLET ONCE DAILY. 30 tablet 5  . metoprolol succinate (TOPROL-XL) 25 MG 24 hr tablet Take 1 tablet (25 mg total) by mouth daily. 30 tablet 3  . naproxen (NAPROSYN) 250 MG tablet   0  . naproxen (NAPROSYN) 375 MG tablet Take 1 tablet (375 mg  total) by mouth 2 (two) times daily as needed for moderate pain. 15 tablet 0  . nitroGLYCERIN (NITROSTAT) 0.4 MG SL tablet Place 1 tablet (0.4 mg total) under the tongue every 5 (five) minutes as needed for chest pain (CP or SOB). 25 tablet 12  . predniSONE (DELTASONE) 10 MG tablet Take 3 tablets daily for 2 days, then 2 tablet daily for 3 days and then 1 tab for 3 days. 7 tablet 0  . ramipril (ALTACE) 1.25 MG capsule Take 1 capsule (1.25 mg total) by mouth daily. 30 capsule 3  . tamsulosin (FLOMAX) 0.4 MG CAPS capsule TAKE (1) CAPSULE DAILY. (Patient taking differently: TAKE (2) CAPSULES  DAILY AFTER SUPPER) 30 capsule 3  . venlafaxine XR (EFFEXOR XR) 75 MG 24 hr capsule Take 1 capsule (75 mg total) by mouth daily. 30 capsule 5   No current facility-administered medications for this visit.    Lab Results: No results found for this or any previous visit (from the past 48 hour(s)).  Physical Findings: AIMS:  , ,  ,  ,    CIWA:    COWS:     Musculoskeletal: Strength & Muscle Tone: within normal limits Gait & Station: normal Patient leans: Right  Psychiatric Specialty Exam: ROS  Blood pressure 118/60, pulse 64, height 5\' 8"  (1.727 m), weight 191 lb (86.637 kg).Body mass index is 29.05 kg/(m^2).  General Appearance: Negative  Eye Contact::  Good  Speech:  Clear and Coherent  Volume:  Normal  Mood:  NA  Affect:  Appropriate  Thought Process:  Coherent  Orientation:  Full (Time, Place, and Person)  Thought Content:  WDL  Suicidal Thoughts:  No  Homicidal Thoughts:  No  Memory:  NA  Judgement:  Good  Insight:  Good  Psychomotor Activity:  Normal  Concentration:  Good  Recall:  Good  Fund of Knowledge:Good  Language: Good  Akathisia:  No  Handed:  Right  AIMS (if indicated):     Assets:  Desire for Improvement  ADL's:  Intact  Cognition: WNL  Sleep:      Treatment Plan Summary: At this time the patient will continue taking the Effexor 150 mg X are in the morning. Over the  year we have increased it from 34 and he felt as distinct improvement. He'll continue taking a low dose of Wellbutrin 100 mg slow release. Today our intervention was to discontinue his imipramine. He was taking 10 mg 2 at night. I don't think this is a good agent for this individual giving his age and the fact that he wakes up a bit sedated. I do not think it will affect him emotionally. The patient also takes Xanax 0.5 mg patch reasonable. Most the time he takes it at night and helps him sleep. So the combination of Effexor Wellbutrin and a little bit of Xanax seems to help this individual a great deal. Is not suicidal. He drinks a fair amount only about 2 drinks a night. Is not had any falls. Generally he likes to workout but this time he's been sick with with what sounds like an upper respiratory tract infection. He just recently got over a bladder infection. This patient to return to see me in 3 months and he will bring his wife at that time.  Charlestine Night [facility] 11/29/2015, 9:55 AM

## 2015-12-02 ENCOUNTER — Ambulatory Visit (HOSPITAL_COMMUNITY): Payer: Self-pay

## 2015-12-04 ENCOUNTER — Ambulatory Visit (HOSPITAL_COMMUNITY): Payer: Self-pay

## 2015-12-06 ENCOUNTER — Ambulatory Visit (HOSPITAL_COMMUNITY): Payer: Self-pay

## 2015-12-09 ENCOUNTER — Ambulatory Visit (HOSPITAL_COMMUNITY): Payer: Self-pay

## 2015-12-11 ENCOUNTER — Ambulatory Visit (HOSPITAL_COMMUNITY): Payer: Self-pay

## 2015-12-13 ENCOUNTER — Ambulatory Visit (HOSPITAL_COMMUNITY): Payer: Self-pay

## 2015-12-16 ENCOUNTER — Ambulatory Visit (HOSPITAL_COMMUNITY): Payer: Self-pay

## 2015-12-18 ENCOUNTER — Ambulatory Visit (HOSPITAL_COMMUNITY): Payer: Self-pay

## 2015-12-20 ENCOUNTER — Ambulatory Visit (HOSPITAL_COMMUNITY): Payer: Self-pay

## 2015-12-23 ENCOUNTER — Ambulatory Visit (HOSPITAL_COMMUNITY): Payer: Self-pay

## 2015-12-24 DIAGNOSIS — E291 Testicular hypofunction: Secondary | ICD-10-CM | POA: Diagnosis not present

## 2015-12-25 ENCOUNTER — Ambulatory Visit (HOSPITAL_COMMUNITY): Payer: Self-pay

## 2015-12-27 ENCOUNTER — Ambulatory Visit (HOSPITAL_COMMUNITY): Payer: Self-pay

## 2015-12-30 ENCOUNTER — Ambulatory Visit (HOSPITAL_COMMUNITY): Payer: Self-pay

## 2016-01-01 ENCOUNTER — Ambulatory Visit (HOSPITAL_COMMUNITY): Payer: Self-pay

## 2016-01-03 ENCOUNTER — Ambulatory Visit (HOSPITAL_COMMUNITY): Payer: Self-pay

## 2016-01-06 ENCOUNTER — Ambulatory Visit (HOSPITAL_COMMUNITY): Payer: Self-pay

## 2016-01-08 ENCOUNTER — Ambulatory Visit (HOSPITAL_COMMUNITY): Payer: Self-pay

## 2016-01-10 ENCOUNTER — Ambulatory Visit (HOSPITAL_COMMUNITY): Payer: Self-pay

## 2016-01-17 ENCOUNTER — Encounter: Payer: Self-pay | Admitting: Endocrinology

## 2016-01-17 ENCOUNTER — Ambulatory Visit (INDEPENDENT_AMBULATORY_CARE_PROVIDER_SITE_OTHER): Payer: Medicare Other | Admitting: Endocrinology

## 2016-01-17 VITALS — BP 106/60 | HR 64 | Temp 98.0°F | Resp 16 | Ht 68.0 in | Wt 185.4 lb

## 2016-01-17 DIAGNOSIS — R42 Dizziness and giddiness: Secondary | ICD-10-CM

## 2016-01-17 DIAGNOSIS — I249 Acute ischemic heart disease, unspecified: Secondary | ICD-10-CM | POA: Diagnosis not present

## 2016-01-17 MED ORDER — MECLIZINE HCL 12.5 MG PO TABS: 12.5000 mg | ORAL_TABLET | Freq: Three times a day (TID) | ORAL | Status: DC | PRN

## 2016-01-17 MED ORDER — ONDANSETRON HCL 4 MG PO TABS: 4.0000 mg | ORAL_TABLET | Freq: Three times a day (TID) | ORAL | Status: DC | PRN

## 2016-01-17 NOTE — Patient Instructions (Signed)
Go to the ER if Headache, Nausea or dizziness get severe

## 2016-01-17 NOTE — Progress Notes (Signed)
Subjective:     Patient ID: Benjamin Hahn, male   DOB: [birth year], 82 y.o.   MRN: [id number]  Dizziness The problem occurs intermittently. The problem has been gradually improving. Associated symptoms include headaches, nausea and vertigo. Pertinent negatives include no weakness.   About 3 nights ago the patient felt to swim he had it when he got up from a chair late in the evening. He again had the same symptom the last 2 nights although less significant.  He does not have any somatic disorder dizziness during the day when he is working or when he is lying in bed or stooping. Last night he had a headache and took Tylenol with relief He also is feeling a headache today which is mostly in the front of the head and not associated with any throbbing. He feels a little woozy today but does not feel dizzy He has been feeling nauseous today but no vomiting No fever  Review of Systems  Gastrointestinal: Positive for nausea.  Neurological: Positive for dizziness, vertigo and headaches. Negative for weakness.   Wt Readings from Last 3 Encounters:  01/17/16 185 lb 6.4 oz (84.097 kg)  11/29/15 191 lb (86.637 kg)  10/03/15 192 lb (87.091 kg)      Objective:   Physical Exam  He is alert Gait is normal Neck stiffness absent No significant nystagmus present on lateral gaze Complains of mild dizziness when gazing laterally Coordination with finger-to-nose test is normal Motor power appears normal      Assessment:     Probable  acute labyrinthitis with intermittent postural vertigo He has a dull intermittent headache mild nausea, may be part of the viral syndrome No objective neck stiffness appreciated and he is not having any concomitant neurological symptoms     Plan:     Symptomatic treatment with meclizine and Zofran He will go to the ER if he has any worsening of his symptoms

## 2016-01-31 DIAGNOSIS — E291 Testicular hypofunction: Secondary | ICD-10-CM | POA: Diagnosis not present

## 2016-02-21 DIAGNOSIS — I2510 Atherosclerotic heart disease of native coronary artery without angina pectoris: Secondary | ICD-10-CM | POA: Diagnosis not present

## 2016-02-21 DIAGNOSIS — E785 Hyperlipidemia, unspecified: Secondary | ICD-10-CM | POA: Diagnosis not present

## 2016-02-21 DIAGNOSIS — I10 Essential (primary) hypertension: Secondary | ICD-10-CM | POA: Diagnosis not present

## 2016-02-21 DIAGNOSIS — M1990 Unspecified osteoarthritis, unspecified site: Secondary | ICD-10-CM | POA: Diagnosis not present

## 2016-02-21 DIAGNOSIS — J45909 Unspecified asthma, uncomplicated: Secondary | ICD-10-CM | POA: Diagnosis not present

## 2016-02-21 DIAGNOSIS — E039 Hypothyroidism, unspecified: Secondary | ICD-10-CM | POA: Diagnosis not present

## 2016-02-28 ENCOUNTER — Other Ambulatory Visit (HOSPITAL_COMMUNITY): Payer: Self-pay | Admitting: Psychiatry

## 2016-03-02 ENCOUNTER — Other Ambulatory Visit (INDEPENDENT_AMBULATORY_CARE_PROVIDER_SITE_OTHER): Payer: Medicare Other

## 2016-03-02 DIAGNOSIS — M6281 Muscle weakness (generalized): Secondary | ICD-10-CM | POA: Diagnosis not present

## 2016-03-03 LAB — T4, FREE: FREE T4: 0.87 ng/dL (ref 0.60–1.60)

## 2016-03-03 LAB — TSH: TSH: 1.81 u[IU]/mL (ref 0.35–4.50)

## 2016-03-04 ENCOUNTER — Other Ambulatory Visit (HOSPITAL_COMMUNITY): Payer: Self-pay | Admitting: Psychiatry

## 2016-03-04 ENCOUNTER — Ambulatory Visit (INDEPENDENT_AMBULATORY_CARE_PROVIDER_SITE_OTHER): Payer: Medicare Other | Admitting: Psychiatry

## 2016-03-04 ENCOUNTER — Encounter (HOSPITAL_COMMUNITY): Payer: Self-pay | Admitting: Psychiatry

## 2016-03-04 VITALS — BP 108/62 | HR 65 | Ht 68.0 in | Wt 183.4 lb

## 2016-03-04 DIAGNOSIS — F3342 Major depressive disorder, recurrent, in full remission: Secondary | ICD-10-CM | POA: Diagnosis not present

## 2016-03-04 DIAGNOSIS — F331 Major depressive disorder, recurrent, moderate: Secondary | ICD-10-CM | POA: Diagnosis not present

## 2016-03-04 DIAGNOSIS — I249 Acute ischemic heart disease, unspecified: Secondary | ICD-10-CM

## 2016-03-04 MED ORDER — BUPROPION HCL ER (XL) 150 MG PO TB24: 150.0000 mg | ORAL_TABLET | ORAL | Status: DC

## 2016-03-04 MED ORDER — ALPRAZOLAM 0.5 MG PO TABS
ORAL_TABLET | ORAL | Status: DC
Start: 2016-03-04 — End: 2016-08-14

## 2016-03-04 MED ORDER — IMIPRAMINE HCL 10 MG PO TABS: 10.0000 mg | ORAL_TABLET | Freq: Every day | ORAL | Status: DC

## 2016-03-04 NOTE — Progress Notes (Signed)
Patient ID: Benjamin Hahn, male   DOB: [birth year], 82 y.o.   MRN: [id number] [facility] MD/PA/NP OP Progress Note  03/04/2016 1:40 PM Benjamin Hahn  MRN:  [id number]  Chief Complaint:  Subjective: Doing great Diagnosis : Major depression chronic, recurrent  Past Psychiatric History:  Today the patient is seen on time. The patient is doing very well. 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 He still wants to work. He apparently has some financial debts. I think this individual does better emotionally by working. He has few other stresses. He says is biggest stress is erectile dysfunction. He presently is taking testosterone replacement. He's not sure it's working. At this time the patient denies daily depression. He is sleeping and eating well. He exercises regularly. He stays active. He drinks only one or 2 drinks a night. He is no evidence of psychosis. He denies any chest pain or shortness of breath. Is physically very active. He's relatively close with his wife. He is to daughters are both well unsuccessful. The patient's only concern is finances and erectile dysfunction. Nobody's died around. He's chosen to continue taking Wellbutrin and thinks is working really well. He says the XL working all day long he can feel an increase in energy. He claims it is better than the slow release was taking before. Noted is that he is not replaced restarted back on the Effexor. He still takes a very small dose of Xanax 0.5 mg half at night and imipramine 10 mg at night. The patient also takes melatonin. The patient is lost a little weight and feels great. He is physically very active. He says at times he feels confused but it's short-lived. For day or so he can get depressed but it does not last long. He clearly is very resilient. He is medically stable. Past Medical History:  Past Medical History  Diagnosis Date  . Hypothyroid   . Depression 1990s  . Shingles May 2014    "Right face, still has some"  .  Hypercholesteremia     controled  . Anxiety   . Transient blindness of both eyes   . GERD (gastroesophageal reflux disease)     occasional  . Arthritis     might be in back, no problems  . Temporal arteritis ([place])   . BPH (benign prostatic hyperplasia)     Past Surgical History  Procedure Laterality Date  . None    . No past surgeries    . Artery biopsy Right 06/20/2013    Procedure: BIOPSY TEMPORAL ARTERY RIGHT;  Surgeon: Earnstine Regal, MD;  Location: [facility];  Service: General;  Laterality: Right;  . Cardiac catheterization N/A 09/02/2015    Procedure: Left Heart Cath and Coronary Angiography;  Surgeon: Charolette Forward, MD;  Location: [place] [facility];  Service: Cardiovascular;  Laterality: N/A;  . Cardiac catheterization N/A 09/02/2015    Procedure: Coronary Stent Intervention;  Surgeon: Charolette Forward, MD;  Location: [place] [facility];  Service: Cardiovascular;  Laterality: N/A;  1.  mid RCA      (3.0/28mm Xience) 2.  Mid LAD      (3.0/23mm Xience)    Family Psychiatric History:   Family History:  Family History  Problem Relation Age of Onset  . Stroke Father     Died, 80s  . Stroke Sister     Living, 82  . Heart disease Mother     Died, 17  . Healthy Daughter   . Diabetes Maternal Grandmother   .  Hypertension Neg Hx     Social History:  Social History   Social History  . Marital Status: Married    Spouse Name: N/A  . Number of Children: N/A  . Years of Education: N/A   Social History Main Topics  . Smoking status: Former Smoker    Types: Cigarettes    Quit date: 08/24/1966  . Smokeless tobacco: Never Used  . Alcohol Use: 1.2 oz/week    2 Shots of liquor per week     Comment: socially  . Drug Use: No  . Sexual Activity: Not Asked   Other Topics Concern  . None   Social History Narrative   Currently works as a Midwife.  Lives with wife and they have two healthy daughters.    Allergies: No Known Allergies  Metabolic Disorder Labs: Lab Results   Component Value Date   HGBA1C 6.1 06/01/2013   No results found for: PROLACTIN Lab Results  Component Value Date   CHOL 147 08/31/2015   TRIG 55 08/31/2015   HDL 49 08/31/2015   CHOLHDL 3.0 08/31/2015   VLDL 11 08/31/2015   LDLCALC 87 08/31/2015   LDLCALC 150* 04/30/2015     Current Medications: Current Outpatient Prescriptions  Medication Sig Dispense Refill  . acetaminophen (TYLENOL) 500 MG tablet Take 500 mg by mouth daily as needed (pain).    [name] ALPRAZolam (XANAX) 0.5 MG tablet 1-2 tablets by mouth at bedtime as needed 30 tablet 5  . amoxicillin-clavulanate (AUGMENTIN) 875-125 MG tablet Reported on 01/17/2016  0  . aspirin 81 MG chewable tablet Chew 1 tablet (81 mg total) by mouth daily. 30 tablet 3  . atorvastatin (LIPITOR) 40 MG tablet Take 1 tablet (40 mg total) by mouth daily at 6 PM. 30 tablet 3  . buPROPion (WELLBUTRIN XL) 150 MG 24 hr tablet Take 1 tablet (150 mg total) by mouth every morning. 30 tablet 5  . cephALEXin (KEFLEX) 500 MG capsule Take 1 capsule (500 mg total) by mouth 2 (two) times daily. (Patient not taking: Reported on 01/17/2016) 14 capsule 0  . clopidogrel (PLAVIX) 75 MG tablet Take 1 tablet (75 mg total) by mouth daily with breakfast. 30 tablet 3  . imipramine (TOFRANIL) 10 MG tablet Take 1 tablet (10 mg total) by mouth at bedtime. 60 tablet 4  . levothyroxine (SYNTHROID, LEVOTHROID) 88 MCG tablet TAKE 1 TABLET ONCE DAILY. 30 tablet 5  . meclizine (ANTIVERT) 12.5 MG tablet Take 1 tablet (12.5 mg total) by mouth 3 (three) times daily as needed for dizziness. 30 tablet 0  . metoprolol succinate (TOPROL-XL) 25 MG 24 hr tablet Take 1 tablet (25 mg total) by mouth daily. 30 tablet 3  . nitroGLYCERIN (NITROSTAT) 0.4 MG SL tablet Place 1 tablet (0.4 mg total) under the tongue every 5 (five) minutes as needed for chest pain (CP or SOB). 25 tablet 12  . ondansetron (ZOFRAN) 4 MG tablet Take 1 tablet (4 mg total) by mouth every 8 (eight) hours as needed for nausea or  vomiting. 20 tablet 0  . oxyCODONE-acetaminophen (PERCOCET/ROXICET) 5-325 MG tablet   0  . predniSONE (DELTASONE) 10 MG tablet Take 3 tablets daily for 2 days, then 2 tablet daily for 3 days and then 1 tab for 3 days. 7 tablet 0  . ramipril (ALTACE) 1.25 MG capsule Take 1 capsule (1.25 mg total) by mouth daily. 30 capsule 3  . tamsulosin (FLOMAX) 0.4 MG CAPS capsule TAKE (1) CAPSULE DAILY. (Patient taking differently: TAKE (2) CAPSULES  DAILY AFTER SUPPER) 30 capsule 3   No current facility-administered medications for this visit.    Neurologic: Headache: No Seizure: No Paresthesias: No  Musculoskeletal: Strength & Muscle Tone: within normal limits Gait & Station: normal Patient leans: NA  Psychiatric Specialty Exam: ROS  Blood pressure 108/62, pulse 65, height 5\' 8"  (1.727 m), weight 183 lb 6.4 oz (83.19 kg).Body mass index is 27.89 kg/(m^2).  General Appearance: Fairly Groomed  Eye Contact:  Good  Speech:  Clear and Coherent  Volume:  Normal  Mood:  Euthymic  Affect:  Appropriate  Thought Process:  Coherent  Orientation:  Full (Time, Place, and Person)  Thought Content:  WDL  Suicidal Thoughts:  No  Homicidal Thoughts:  No  Memory:  NA  Judgement:  Good  Insight:  Good  Psychomotor Activity:  Normal  Concentration:  Good  Recall:  Good  Fund of Knowledge: Good  Language: Good  Akathisia:  No  Handed:  Right  AIMS (if indicated):    Assets:  Desire for Improvement  ADL's:  Intact  Cognition: WNL  Sleep:       Treatment Plan Summary:At this time the patient is doing fairly well. We had a long discussion of his medications. I shared with him that I would rather not be on imipramine for long-term basis given its anticholinergic effects area on the other hand he denies sedation constipation or blurred vision. For now we'll continue it but we will make some changes. We will start him back on Effexor at a lower dose of 75 mg XR and changes Wellbutrin from 100 mg slow  release to dose of 150 mg XL. He'll take Effexor and Wellbutrin in the morning. He'll continue taking Xanax 0.5 mg at night which helps him sleep. For now he'll continue low-dose imipramine 10 mg at night. At the incident significant dose and probably is no reason to make any changes with it. Today the patient did not come in with his wife. Is getting along well with her and seems to be a nonissue. The patient is doing well. He denies any chest pain these days. He has no shortness of breath. He is in pretty good shape. He denies any neurological symptoms.   Haskel Schroeder, MD 03/04/2016, 1:40 PM [facility] MD Progress Note  03/04/2016 1:40 PM Benjamin Hahn  MRN:  [id number] Subjective:  Feels well Principal Problem: Major depression, chronic, mild/residual Diagnosis:  Major depression, recurrent residual Today the patient is doing fairly well. He's been having some medical problems. He recently went for Thanksgiving to a length but unfortunately had a urinary tract infection. He also since then has been coughing and recently started on antibiotic. Emotionally he is fairly stable. He denies daily depression. He stays very active at work. He's been unable to exercise. The patient is sleeping well and has an increase in his appetite. His energy is good. Is no problems thinking or concentrating. He is recently having a productive cough but is no shortness of breath. He denies any chest pain. Is a stable relationship with his wife. His kidneys are good. He has 4 grandchildren. Everybody in his home is good. He only issue is that a number mornings he awakens and he feels oversedated. It occurs point of his wife feels uncomfortable with him driving will taken to work. Today reviewed his medications and is evident that he likely needs reduction. He also notes that his memory has change in his and is good. 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 But for now been discontinued. Patient  Active Problem List   Diagnosis Date Noted  . Acute coronary syndrome ([place]) [I24.9] 08/30/2015  . Temporal arteritis ([place]) [M31.6] 06/16/2013  . Amaurosis fugax [G45.3] 06/01/2013  . Unspecified hypothyroidism [E03.9] 03/14/2013  . Other and unspecified hyperlipidemia [E78.5] 03/14/2013  . BPH (benign prostatic hypertrophy) [N40.0] 03/14/2013  . Hypogonadism, male [E29.1] 03/14/2013  . Erectile dysfunction [N52.9] 03/14/2013   Total Time spent with patient: 30 minutes  Past Psychiatric History:   Past Medical History:  Past Medical History  Diagnosis Date  . Hypothyroid   . Depression 1990s  . Shingles May 2014    "Right face, still has some"  . Hypercholesteremia     controled  . Anxiety   . Transient blindness of both eyes   . GERD (gastroesophageal reflux disease)     occasional  . Arthritis     might be in back, no problems  . Temporal arteritis ([place])   . BPH (benign prostatic hyperplasia)     Past Surgical History  Procedure Laterality Date  . None    . No past surgeries    . Artery biopsy Right 06/20/2013    Procedure: BIOPSY TEMPORAL ARTERY RIGHT;  Surgeon: Earnstine Regal, MD;  Location: [facility];  Service: General;  Laterality: Right;  . Cardiac catheterization N/A 09/02/2015    Procedure: Left Heart Cath and Coronary Angiography;  Surgeon: Charolette Forward, MD;  Location: [place] [facility];  Service: Cardiovascular;  Laterality: N/A;  . Cardiac catheterization N/A 09/02/2015    Procedure: Coronary Stent Intervention;  Surgeon: Charolette Forward, MD;  Location: [place] [facility];  Service: Cardiovascular;  Laterality: N/A;  1.  mid RCA      (3.0/28mm Xience) 2.  Mid LAD      (3.0/23mm Xience)   Family History:  Family History  Problem Relation Age of Onset  . Stroke Father     Died, 80s  . Stroke Sister     Living, 12  . Heart disease Mother     Died, 74  . Healthy Daughter   . Diabetes Maternal Grandmother   . Hypertension Neg Hx    Family Psychiatric   History:  Social History:  History  Alcohol Use  . 1.2 oz/week  . 2 Shots of liquor per week    Comment: socially     History  Drug Use No    Social History   Social History  . Marital Status: Married    Spouse Name: N/A  . Number of Children: N/A  . Years of Education: N/A   Social History Main Topics  . Smoking status: Former Smoker    Types: Cigarettes    Quit date: 08/24/1966  . Smokeless tobacco: Never Used  . Alcohol Use: 1.2 oz/week    2 Shots of liquor per week     Comment: socially  . Drug Use: No  . Sexual Activity: Not Asked   Other Topics Concern  . None   Social History Narrative   Currently works as a Midwife.  Lives with wife and they have two healthy daughters.   Additional Social History:                         Sleep: Good  Appetite:  Good  Current Medications: Current Outpatient Prescriptions  Medication Sig Dispense Refill  . acetaminophen (TYLENOL) 500 MG tablet Take 500  mg by mouth daily as needed (pain).    [name] ALPRAZolam (XANAX) 0.5 MG tablet 1-2 tablets by mouth at bedtime as needed 30 tablet 5  . amoxicillin-clavulanate (AUGMENTIN) 875-125 MG tablet Reported on 01/17/2016  0  . aspirin 81 MG chewable tablet Chew 1 tablet (81 mg total) by mouth daily. 30 tablet 3  . atorvastatin (LIPITOR) 40 MG tablet Take 1 tablet (40 mg total) by mouth daily at 6 PM. 30 tablet 3  . buPROPion (WELLBUTRIN XL) 150 MG 24 hr tablet Take 1 tablet (150 mg total) by mouth every morning. 30 tablet 5  . cephALEXin (KEFLEX) 500 MG capsule Take 1 capsule (500 mg total) by mouth 2 (two) times daily. (Patient not taking: Reported on 01/17/2016) 14 capsule 0  . clopidogrel (PLAVIX) 75 MG tablet Take 1 tablet (75 mg total) by mouth daily with breakfast. 30 tablet 3  . imipramine (TOFRANIL) 10 MG tablet Take 1 tablet (10 mg total) by mouth at bedtime. 60 tablet 4  . levothyroxine (SYNTHROID, LEVOTHROID) 88 MCG tablet TAKE 1 TABLET ONCE DAILY. 30 tablet 5  .  meclizine (ANTIVERT) 12.5 MG tablet Take 1 tablet (12.5 mg total) by mouth 3 (three) times daily as needed for dizziness. 30 tablet 0  . metoprolol succinate (TOPROL-XL) 25 MG 24 hr tablet Take 1 tablet (25 mg total) by mouth daily. 30 tablet 3  . nitroGLYCERIN (NITROSTAT) 0.4 MG SL tablet Place 1 tablet (0.4 mg total) under the tongue every 5 (five) minutes as needed for chest pain (CP or SOB). 25 tablet 12  . ondansetron (ZOFRAN) 4 MG tablet Take 1 tablet (4 mg total) by mouth every 8 (eight) hours as needed for nausea or vomiting. 20 tablet 0  . oxyCODONE-acetaminophen (PERCOCET/ROXICET) 5-325 MG tablet   0  . predniSONE (DELTASONE) 10 MG tablet Take 3 tablets daily for 2 days, then 2 tablet daily for 3 days and then 1 tab for 3 days. 7 tablet 0  . ramipril (ALTACE) 1.25 MG capsule Take 1 capsule (1.25 mg total) by mouth daily. 30 capsule 3  . tamsulosin (FLOMAX) 0.4 MG CAPS capsule TAKE (1) CAPSULE DAILY. (Patient taking differently: TAKE (2) CAPSULES  DAILY AFTER SUPPER) 30 capsule 3   No current facility-administered medications for this visit.    Lab Results:  Results for orders placed or performed in visit on 03/02/16 (from the past 48 hour(s))  T4, free     Status: None   Collection Time: 03/02/16  5:12 PM  Result Value Ref Range   Free T4 0.87 0.60 - 1.60 ng/dL  TSH     Status: None   Collection Time: 03/02/16  5:12 PM  Result Value Ref Range   TSH 1.81 0.35 - 4.50 uIU/mL    Physical Findings: AIMS:  , ,  ,  ,    CIWA:    COWS:     Musculoskeletal: Strength & Muscle Tone: within normal limits Gait & Station: normal Patient leans: Right  Psychiatric Specialty Exam: ROS  Blood pressure 108/62, pulse 65, height 5\' 8"  (1.727 m), weight 183 lb 6.4 oz (83.19 kg).Body mass index is 27.89 kg/(m^2).  General Appearance: Negative  Eye Contact::  Good  Speech:  Clear and Coherent  Volume:  Normal  Mood:  NA  Affect:  Appropriate  Thought Process:  Coherent  Orientation:   Full (Time, Place, and Person)  Thought Content:  WDL  Suicidal Thoughts:  No  Homicidal Thoughts:  No  Memory:  NA  Judgement:  Good  Insight:  Good  Psychomotor Activity:  Normal  Concentration:  Good  Recall:  Good  Fund of Knowledge:Good  Language: Good  Akathisia:  No  Handed:  Right  AIMS (if indicated):     Assets:  Desire for Improvement  ADL's:  Intact  Cognition: WNL  Sleep:      Treatment Plan Summary: At this time the patient will continue taking the Effexor 150 mg X are in the morning. Over the year we have increased it from 64 and he felt as distinct improvement. He'll continue taking a low dose of Wellbutrin 100 mg slow release. Today our intervention was to discontinue his imipramine. He was taking 10 mg 2 at night. I don't think this is a good agent for this individual giving his age and the fact that he wakes up a bit sedated. I do not think it will affect him emotionally. The patient also takes Xanax 0.5 mg patch reasonable. Most the time he takes it at night and helps him sleep. So the combination of Effexor Wellbutrin and a little bit of Xanax seems to help this individual a great deal. Is not suicidal. He drinks a fair amount only about 2 drinks a night. Is not had any falls. Generally he likes to workout but this time he's been sick with with what sounds like an upper respiratory tract infection. He just recently got over a bladder infection. This patient to return to see me in 3 months and he will bring his wife at that time.  Charlestine Night Max Romano 03/04/2016, 1:40 PM [facility] MD Progress Note  03/04/2016 1:40 PM Benjamin Hahn  MRN:  [id number] Subjective:  Feels well Principal Problem: Major depression, chronic, mild/residual Diagnosis:  Major depression, recurrent residual Today the patient is doing fairly well. He's been having some medical problems. He recently went for Thanksgiving to a length but unfortunately had a urinary tract infection. He also since  then has been coughing and recently started on antibiotic. Emotionally he is fairly stable. He denies daily depression. He stays very active at work. He's been unable to exercise. The patient is sleeping well and has an increase in his appetite. His energy is good. Is no problems thinking or concentrating. He is recently having a productive cough but is no shortness of breath. He denies any chest pain. Is a stable relationship with his wife. His kidneys are good. He has 4 grandchildren. Everybody in his home is good. He only issue is that a number mornings he awakens and he feels oversedated. It occurs point of his wife feels uncomfortable with him driving will taken to work. Today reviewed his medications and is evident that he likely needs reduction. He also notes that his memory has change in his and is good. 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 Patient Active Problem List   Diagnosis Date Noted  . Acute coronary syndrome ([place]) [I24.9] 08/30/2015  . Temporal arteritis ([place]) [M31.6] 06/16/2013  . Amaurosis fugax [G45.3] 06/01/2013  . Unspecified hypothyroidism [E03.9] 03/14/2013  . Other and unspecified hyperlipidemia [E78.5] 03/14/2013  . BPH (benign prostatic hypertrophy) [N40.0] 03/14/2013  . Hypogonadism, male [E29.1] 03/14/2013  . Erectile dysfunction [N52.9] 03/14/2013   Total Time spent with patient: 30 minutes  Past Psychiatric History:   Past Medical History:  Past Medical History  Diagnosis Date  . Hypothyroid   . Depression 1990s  . Shingles May 2014    "  Right face, still has some"  . Hypercholesteremia     controled  . Anxiety   . Transient blindness of both eyes   . GERD (gastroesophageal reflux disease)     occasional  . Arthritis     might be in back, no problems  . Temporal arteritis ([place])   . BPH (benign prostatic hyperplasia)     Past Surgical History  Procedure Laterality Date  . None    . No past  surgeries    . Artery biopsy Right 06/20/2013    Procedure: BIOPSY TEMPORAL ARTERY RIGHT;  Surgeon: Earnstine Regal, MD;  [facility]: [facility];  Service: General;  Laterality: Right;  . Cardiac catheterization N/A 09/02/2015    Procedure: Left Heart Cath and Coronary Angiography;  Surgeon: Charolette Forward, MD;  Location: [place] [facility];  Service: Cardiovascular;  Laterality: N/A;  . Cardiac catheterization N/A 09/02/2015    Procedure: Coronary Stent Intervention;  Surgeon: Charolette Forward, MD;  Location: [place] [facility];  Service: Cardiovascular;  Laterality: N/A;  1.  mid RCA      (3.0/28mm Xience) 2.  Mid LAD      (3.0/23mm Xience)   Family History:  Family History  Problem Relation Age of Onset  . Stroke Father     Died, 80s  . Stroke Sister     Living, 5  . Heart disease Mother     Died, 78  . Healthy Daughter   . Diabetes Maternal Grandmother   . Hypertension Neg Hx    Family Psychiatric  History:  Social History:  History  Alcohol Use  . 1.2 oz/week  . 2 Shots of liquor per week    Comment: socially     History  Drug Use No    Social History   Social History  . Marital Status: Married    Spouse Name: N/A  . Number of Children: N/A  . Years of Education: N/A   Social History Main Topics  . Smoking status: Former Smoker    Types: Cigarettes    Quit date: 08/24/1966  . Smokeless tobacco: Never Used  . Alcohol Use: 1.2 oz/week    2 Shots of liquor per week     Comment: socially  . Drug Use: No  . Sexual Activity: Not Asked   Other Topics Concern  . None   Social History Narrative   Currently works as a Midwife.  Lives with wife and they have two healthy daughters.   Additional Social History:                         Sleep: Good  Appetite:  Good  Current Medications: Current Outpatient Prescriptions  Medication Sig Dispense Refill  . acetaminophen (TYLENOL) 500 MG tablet Take 500 mg by mouth daily as needed (pain).    [name] ALPRAZolam  (XANAX) 0.5 MG tablet 1-2 tablets by mouth at bedtime as needed 30 tablet 5  . amoxicillin-clavulanate (AUGMENTIN) 875-125 MG tablet Reported on 01/17/2016  0  . aspirin 81 MG chewable tablet Chew 1 tablet (81 mg total) by mouth daily. 30 tablet 3  . atorvastatin (LIPITOR) 40 MG tablet Take 1 tablet (40 mg total) by mouth daily at 6 PM. 30 tablet 3  . buPROPion (WELLBUTRIN XL) 150 MG 24 hr tablet Take 1 tablet (150 mg total) by mouth every morning. 30 tablet 5  . cephALEXin (KEFLEX) 500 MG capsule Take 1 capsule (500 mg total) by mouth  2 (two) times daily. (Patient not taking: Reported on 01/17/2016) 14 capsule 0  . clopidogrel (PLAVIX) 75 MG tablet Take 1 tablet (75 mg total) by mouth daily with breakfast. 30 tablet 3  . imipramine (TOFRANIL) 10 MG tablet Take 1 tablet (10 mg total) by mouth at bedtime. 60 tablet 4  . levothyroxine (SYNTHROID, LEVOTHROID) 88 MCG tablet TAKE 1 TABLET ONCE DAILY. 30 tablet 5  . meclizine (ANTIVERT) 12.5 MG tablet Take 1 tablet (12.5 mg total) by mouth 3 (three) times daily as needed for dizziness. 30 tablet 0  . metoprolol succinate (TOPROL-XL) 25 MG 24 hr tablet Take 1 tablet (25 mg total) by mouth daily. 30 tablet 3  . nitroGLYCERIN (NITROSTAT) 0.4 MG SL tablet Place 1 tablet (0.4 mg total) under the tongue every 5 (five) minutes as needed for chest pain (CP or SOB). 25 tablet 12  . ondansetron (ZOFRAN) 4 MG tablet Take 1 tablet (4 mg total) by mouth every 8 (eight) hours as needed for nausea or vomiting. 20 tablet 0  . oxyCODONE-acetaminophen (PERCOCET/ROXICET) 5-325 MG tablet   0  . predniSONE (DELTASONE) 10 MG tablet Take 3 tablets daily for 2 days, then 2 tablet daily for 3 days and then 1 tab for 3 days. 7 tablet 0  . ramipril (ALTACE) 1.25 MG capsule Take 1 capsule (1.25 mg total) by mouth daily. 30 capsule 3  . tamsulosin (FLOMAX) 0.4 MG CAPS capsule TAKE (1) CAPSULE DAILY. (Patient taking differently: TAKE (2) CAPSULES  DAILY AFTER SUPPER) 30 capsule 3   No  current facility-administered medications for this visit.    Lab Results:  Results for orders placed or performed in visit on 03/02/16 (from the past 48 hour(s))  T4, free     Status: None   Collection Time: 03/02/16  5:12 PM  Result Value Ref Range   Free T4 0.87 0.60 - 1.60 ng/dL  TSH     Status: None   Collection Time: 03/02/16  5:12 PM  Result Value Ref Range   TSH 1.81 0.35 - 4.50 uIU/mL    Physical Findings: AIMS:  , ,  ,  ,    CIWA:    COWS:     Musculoskeletal: Strength & Muscle Tone: within normal limits Gait & Station: normal Patient leans: Right  Psychiatric Specialty Exam: ROS  Blood pressure 108/62, pulse 65, height 5\' 8"  (1.727 m), weight 183 lb 6.4 oz (83.19 kg).Body mass index is 27.89 kg/(m^2).  General Appearance: Negative  Eye Contact::  Good  Speech:  Clear and Coherent  Volume:  Normal  Mood:  NA  Affect:  Appropriate  Thought Process:  Coherent  Orientation:  Full (Time, Place, and Person)  Thought Content:  WDL  Suicidal Thoughts:  No  Homicidal Thoughts:  No  Memory:  NA  Judgement:  Good  Insight:  Good  Psychomotor Activity:  Normal  Concentration:  Good  Recall:  Good  Fund of Knowledge:Good  Language: Good  Akathisia:  No  Handed:  Right  AIMS (if indicated):     Assets:  Desire for Improvement  ADL's:  Intact  Cognition: WNL  Sleep:      Treatment Plan Summary:  At this time the patient is doing great. He'll continue taking Wellbutrin 150 mg XL in the morning. He doesn't take Effexor anymore. I shared within the last medicine that better. He does take a small dose of Xanax 0.5 mg half at night together with 5 mg of melatonin which helps  him sleep. He also takes only 10 mg of the never been which also helps him. His #1 problem therefore is clinical depression which is much resolved at this time. The patient is sleeping well on the present medications. His second problem is erectile dysfunction which is getting testosterone  replacement from his primary care doctor is third problem is a history of an MI. At this time is well-controlled in terms of chest pain and shortness of breath. Is no physical complaints at all. This patient was asked to return in 5 months with his wife will get her perspective. The patient denies any neurological symptoms. He certainly is not suicidal. He is functioning extremely well. This particular true an 82 year old gentleman. 03/04/2016, 1:40 PM

## 2016-03-06 ENCOUNTER — Encounter: Payer: Self-pay | Admitting: Endocrinology

## 2016-03-06 ENCOUNTER — Ambulatory Visit (INDEPENDENT_AMBULATORY_CARE_PROVIDER_SITE_OTHER): Payer: Medicare Other | Admitting: Endocrinology

## 2016-03-06 ENCOUNTER — Other Ambulatory Visit: Payer: Self-pay | Admitting: Endocrinology

## 2016-03-06 VITALS — BP 108/62 | HR 70 | Wt 186.0 lb

## 2016-03-06 DIAGNOSIS — E785 Hyperlipidemia, unspecified: Secondary | ICD-10-CM

## 2016-03-06 DIAGNOSIS — I249 Acute ischemic heart disease, unspecified: Secondary | ICD-10-CM

## 2016-03-06 DIAGNOSIS — E038 Other specified hypothyroidism: Secondary | ICD-10-CM

## 2016-03-06 DIAGNOSIS — E063 Autoimmune thyroiditis: Secondary | ICD-10-CM

## 2016-03-06 DIAGNOSIS — E291 Testicular hypofunction: Secondary | ICD-10-CM | POA: Diagnosis not present

## 2016-03-06 NOTE — Progress Notes (Signed)
Subjective:      Patient ID: Benjamin Hahn, male   DOB: 1932/12/05, 82 y.o.   MRN: [id number]  Chief complaint:Follow-up  HPI     Cardiac follow-up for CAD.  He has seen his cardiologist recently and no medication changes made.  He has not had any chest pain or shortness of breath, is able to exercise now.  Blood pressure tends to be low normal but appears to be unchanged and he does not feel lightheaded    Hypothyroidism: This is long-standing and he is usually on a stable dose since 2014 Currently taking 88 mcg. Although his TSH was relatively high in the hospital his labs are back to normal with his taking the medication consistently before breakfast now  Last lab results are as follows:  Lab Results  Component Value Date   FREET4 0.87 03/02/2016   FREET4 0.79 04/30/2015   FREET4 0.78 01/23/2015   TSH 1.81 03/02/2016   TSH 6.500* 08/30/2015   TSH 1.37 04/30/2015      Weight gain: His weight has improved This year and is stable.  He is using an elliptical machine for exercise   Wt Readings from Last 3 Encounters:  03/06/16 186 lb (84.369 kg)  03/04/16 183 lb 6.4 oz (83.19 kg)  01/17/16 185 lb 6.4 oz (84.097 kg)       Medication List       This list is accurate as of: 03/06/16  5:04 PM.  Always use your most recent med list.               acetaminophen 500 MG tablet  Commonly known as:  TYLENOL  Take 500 mg by mouth daily as needed (pain).     ALPRAZolam 0.5 MG tablet  Commonly known as:  XANAX  1-2 tablets by mouth at bedtime as needed     aspirin 81 MG chewable tablet  Chew 1 tablet (81 mg total) by mouth daily.     atorvastatin 40 MG tablet  Commonly known as:  LIPITOR  Take 1 tablet (40 mg total) by mouth daily at 6 PM.     buPROPion 150 MG 24 hr tablet  Commonly known as:  WELLBUTRIN XL  Take 1 tablet (150 mg total) by mouth every morning.     clopidogrel 75 MG tablet  Commonly known as:  PLAVIX  Take 1 tablet (75 mg total) by mouth daily  with breakfast.     imipramine 10 MG tablet  Commonly known as:  TOFRANIL  Take 1 tablet (10 mg total) by mouth at bedtime.     levothyroxine 88 MCG tablet  Commonly known as:  SYNTHROID, LEVOTHROID  TAKE 1 TABLET ONCE DAILY.     metoprolol succinate 25 MG 24 hr tablet  Commonly known as:  TOPROL-XL  TAKE 1 TABLET ONCE DAILY.     nitroGLYCERIN 0.4 MG SL tablet  Commonly known as:  NITROSTAT  Place 1 tablet (0.4 mg total) under the tongue every 5 (five) minutes as needed for chest pain (CP or SOB).     ramipril 1.25 MG capsule  Commonly known as:  ALTACE  Take 1 capsule (1.25 mg total) by mouth daily.     tamsulosin 0.4 MG Caps capsule  Commonly known as:  FLOMAX  TAKE (1) CAPSULE DAILY.        Review of Systems    Long-standing hypogonadism: He has been treated by his urologist.  With his urologist he has been treated with A custom  testosterone cream instead of Testopel pellet since she has been on Plavix His last level earlier this year was over 500 but he has not had a testosterone level done since he started his testosterone cream   Lab Results  Component Value Date   TESTOSTERONE 433.39 03/16/2014     Hypercholesterolemia: This has been long-standing and he hasGood control with taking Lipitor 40 mg No side effects with this   Lab Results  Component Value Date   CHOL 147 08/31/2015   HDL 49 08/31/2015   LDLCALC 87 08/31/2015   TRIG 55 08/31/2015   CHOLHDL 3.0 08/31/2015      BPH: He still takes 0.8 mg of Flomax from his urologistWith fairly good control  Depression: Well controlled with Wellbutrin, still taking imipramine 10 mg at night  He had an episode of vertigo in 5/16 and this is resolved     Objective:   Physical Exam BP 108/62 mmHg  Pulse 70  Wt 186 lb (84.369 kg)  SpO2 97%  Repeat blood pressure 102/60 standing   Assessment:      Hypothyroidism: Adequately replaced  Hypercholesterolemia on treatment: he will need follow-up labs,  not clear if these were checked by cardiologist  Low normal blood pressure: He is asymptomatic and blood pressure is stable.  On minimal doses of ramipril and metoprolol     Plan:      No change in medications  Continue regular exercise  Follow-up in 4 months  Handicapped placard given  He will make sure he has a follow-up with his urologist     [facility]

## 2016-03-06 NOTE — Patient Instructions (Signed)
Exercise 4x per week

## 2016-03-24 ENCOUNTER — Other Ambulatory Visit: Payer: Self-pay | Admitting: Endocrinology

## 2016-03-25 NOTE — Telephone Encounter (Signed)
Okay to fill clobetasol cream?

## 2016-03-30 NOTE — Telephone Encounter (Signed)
What is this for

## 2016-04-01 NOTE — Telephone Encounter (Signed)
Patient stated medication Clobetasol it is for dry skin body cream for itching and dryness. With refills [place], [place] [phone] (Phone) [phone] (Fax)

## 2016-04-03 DIAGNOSIS — E291 Testicular hypofunction: Secondary | ICD-10-CM | POA: Diagnosis not present

## 2016-04-06 ENCOUNTER — Other Ambulatory Visit: Payer: Self-pay | Admitting: Endocrinology

## 2016-04-06 NOTE — Telephone Encounter (Signed)
Please let them know that this is a strong steroid cream, prefer that he use any OTC hydrocortisone instead

## 2016-04-17 DIAGNOSIS — E291 Testicular hypofunction: Secondary | ICD-10-CM | POA: Diagnosis not present

## 2016-05-07 ENCOUNTER — Other Ambulatory Visit (HOSPITAL_COMMUNITY): Payer: Self-pay | Admitting: Psychiatry

## 2016-05-07 DIAGNOSIS — F331 Major depressive disorder, recurrent, moderate: Secondary | ICD-10-CM

## 2016-06-12 ENCOUNTER — Other Ambulatory Visit: Payer: Self-pay | Admitting: Cardiology

## 2016-06-12 DIAGNOSIS — M1990 Unspecified osteoarthritis, unspecified site: Secondary | ICD-10-CM | POA: Diagnosis not present

## 2016-06-12 DIAGNOSIS — E039 Hypothyroidism, unspecified: Secondary | ICD-10-CM | POA: Diagnosis not present

## 2016-06-12 DIAGNOSIS — R079 Chest pain, unspecified: Secondary | ICD-10-CM

## 2016-06-12 DIAGNOSIS — I10 Essential (primary) hypertension: Secondary | ICD-10-CM | POA: Diagnosis not present

## 2016-06-12 DIAGNOSIS — I25119 Atherosclerotic heart disease of native coronary artery with unspecified angina pectoris: Secondary | ICD-10-CM | POA: Diagnosis not present

## 2016-06-12 DIAGNOSIS — E785 Hyperlipidemia, unspecified: Secondary | ICD-10-CM | POA: Diagnosis not present

## 2016-06-12 DIAGNOSIS — J45909 Unspecified asthma, uncomplicated: Secondary | ICD-10-CM | POA: Diagnosis not present

## 2016-06-22 DIAGNOSIS — E291 Testicular hypofunction: Secondary | ICD-10-CM | POA: Diagnosis not present

## 2016-06-23 ENCOUNTER — Other Ambulatory Visit: Payer: Self-pay | Admitting: Endocrinology

## 2016-06-24 ENCOUNTER — Encounter (HOSPITAL_COMMUNITY)
Admission: RE | Admit: 2016-06-24 | Discharge: 2016-06-24 | Disposition: A | Discharge status: Home / Self Care | Payer: Medicare Other | Source: Ambulatory Visit | Attending: Cardiology | Admitting: Cardiology

## 2016-06-24 ENCOUNTER — Ambulatory Visit: Payer: Self-pay

## 2016-06-24 ENCOUNTER — Ambulatory Visit (HOSPITAL_COMMUNITY)
Admission: RE | Admit: 2016-06-24 | Discharge: 2016-06-24 | Disposition: A | Discharge status: Home / Self Care | Payer: Medicare Other | Source: Ambulatory Visit | Attending: Cardiology | Admitting: Cardiology

## 2016-06-24 DIAGNOSIS — R079 Chest pain, unspecified: Secondary | ICD-10-CM | POA: Diagnosis not present

## 2016-06-24 DIAGNOSIS — E785 Hyperlipidemia, unspecified: Secondary | ICD-10-CM | POA: Diagnosis not present

## 2016-06-24 DIAGNOSIS — I25119 Atherosclerotic heart disease of native coronary artery with unspecified angina pectoris: Secondary | ICD-10-CM | POA: Diagnosis not present

## 2016-06-24 DIAGNOSIS — I10 Essential (primary) hypertension: Secondary | ICD-10-CM | POA: Diagnosis not present

## 2016-06-24 MED ORDER — TECHNETIUM TC 99M TETROFOSMIN IV KIT
30.0000 | PACK | Freq: Once | INTRAVENOUS | Status: AC | PRN
  Administered 2016-06-24: 30 via INTRAVENOUS

## 2016-06-24 MED ORDER — REGADENOSON 0.4 MG/5ML IV SOLN
0.4000 mg | Freq: Once | INTRAVENOUS | Status: AC
  Administered 2016-06-24: 0.4 mg via INTRAVENOUS

## 2016-06-24 MED ORDER — TECHNETIUM TC 99M TETROFOSMIN IV KIT
10.0000 | PACK | Freq: Once | INTRAVENOUS | Status: AC | PRN
  Administered 2016-06-24: 10 via INTRAVENOUS

## 2016-06-24 MED ORDER — REGADENOSON 0.4 MG/5ML IV SOLN
INTRAVENOUS | Status: AC
  Administered 2016-06-24: 0.4 mg via INTRAVENOUS
  Filled 2016-06-24: qty 5

## 2016-06-25 ENCOUNTER — Ambulatory Visit (INDEPENDENT_AMBULATORY_CARE_PROVIDER_SITE_OTHER): Payer: Medicare Other | Admitting: Endocrinology

## 2016-06-25 DIAGNOSIS — Z23 Encounter for immunization: Secondary | ICD-10-CM

## 2016-06-26 NOTE — Progress Notes (Signed)
Nurse visit for influenza vaccine only

## 2016-07-02 ENCOUNTER — Other Ambulatory Visit (INDEPENDENT_AMBULATORY_CARE_PROVIDER_SITE_OTHER): Payer: Medicare Other

## 2016-07-02 DIAGNOSIS — E785 Hyperlipidemia, unspecified: Secondary | ICD-10-CM

## 2016-07-02 DIAGNOSIS — E291 Testicular hypofunction: Secondary | ICD-10-CM | POA: Diagnosis not present

## 2016-07-02 LAB — TESTOSTERONE: Testosterone: 197.11 ng/dL — ABNORMAL LOW (ref 300.00–890.00)

## 2016-07-03 LAB — COMPREHENSIVE METABOLIC PANEL
ALBUMIN: 4.1 g/dL (ref 3.5–5.2)
ALT: 16 U/L (ref 0–53)
AST: 16 U/L (ref 0–37)
Alkaline Phosphatase: 52 U/L (ref 39–117)
BILIRUBIN TOTAL: 0.3 mg/dL (ref 0.2–1.2)
BUN: 18 mg/dL (ref 6–23)
CO2: 27 mEq/L (ref 19–32)
Calcium: 9.2 mg/dL (ref 8.4–10.5)
Chloride: 104 mEq/L (ref 96–112)
Creatinine, Ser: 1.27 mg/dL (ref 0.40–1.50)
GFR: 57.79 mL/min — ABNORMAL LOW (ref >60.00)
Glucose, Bld: 98 mg/dL (ref 70–99)
POTASSIUM: 4.4 meq/L (ref 3.5–5.1)
SODIUM: 139 meq/L (ref 135–145)
TOTAL PROTEIN: 6.7 g/dL (ref 6.0–8.3)

## 2016-07-03 LAB — LIPID PANEL
CHOL/HDL RATIO: 3
Cholesterol: 144 mg/dL (ref 0–200)
HDL: 57.1 mg/dL (ref >39.00)
LDL Cholesterol: 66 mg/dL (ref 0–99)
NonHDL: 87.02
TRIGLYCERIDES: 104 mg/dL (ref 0.0–149.0)
VLDL: 20.8 mg/dL (ref 0.0–40.0)

## 2016-07-07 ENCOUNTER — Ambulatory Visit (INDEPENDENT_AMBULATORY_CARE_PROVIDER_SITE_OTHER): Payer: Medicare Other | Admitting: Endocrinology

## 2016-07-07 ENCOUNTER — Encounter: Payer: Self-pay | Admitting: Endocrinology

## 2016-07-07 VITALS — BP 118/60 | HR 66 | Ht 68.0 in | Wt 189.0 lb

## 2016-07-07 DIAGNOSIS — I249 Acute ischemic heart disease, unspecified: Secondary | ICD-10-CM | POA: Diagnosis not present

## 2016-07-07 DIAGNOSIS — E291 Testicular hypofunction: Secondary | ICD-10-CM

## 2016-07-07 DIAGNOSIS — E7800 Pure hypercholesterolemia, unspecified: Secondary | ICD-10-CM

## 2016-07-07 DIAGNOSIS — E038 Other specified hypothyroidism: Secondary | ICD-10-CM | POA: Diagnosis not present

## 2016-07-07 DIAGNOSIS — E063 Autoimmune thyroiditis: Secondary | ICD-10-CM

## 2016-07-07 NOTE — Progress Notes (Signed)
Subjective:      Patient ID: [name], male   DOB: Mar 12, 1933, 82 y.o.   MRN: [id number]  Chief complaint:Follow-up  HPI      Hypothyroidism: This is long-standing and he is usually on a stable dose since 2014 Currently taking 88 mcg. He feels fairly good overall with his energy level and is compliant with his supplements  Last lab results are as follows:  Lab Results  Component Value Date   FREET4 0.87 03/02/2016   FREET4 0.79 04/30/2015   FREET4 0.78 01/23/2015   TSH 1.81 03/02/2016   TSH 6.500 (H) 08/30/2015   TSH 1.37 04/30/2015    HYPOGONADISM: See review of systems   Weight gain: His weight gone up slightly He has been somewhat erratic with his exercise although tries to go to to 3 times a week  He is using an elliptical machine at the gym when he goes   Wt Readings from Last 3 Encounters:  07/07/16 189 lb (85.7 kg)  03/06/16 186 lb (84.4 kg)  01/17/16 185 lb 6.4 oz (84.1 kg)     Depression: Treated by psychiatrist. Well controlled with Wellbutrin, still taking imipramine 10 mg at night to help sleep Needs taking Xanax for anxiety and is getting adequate relief when he needs this   LIPIDS: See review of systems    Medication List       Accurate as of 07/07/16  8:14 AM. Always use your most recent med list.          acetaminophen 500 MG tablet Commonly known as:  TYLENOL Take 500 mg by mouth daily as needed (pain).   ALPRAZolam 0.5 MG tablet Commonly known as:  XANAX 1-2 tablets by mouth at bedtime as needed   aspirin 81 MG chewable tablet Chew 1 tablet (81 mg total) by mouth daily.   atorvastatin 40 MG tablet Commonly known as:  LIPITOR Take 1 tablet (40 mg total) by mouth daily at 6 PM.   buPROPion 150 MG 24 hr tablet Commonly known as:  WELLBUTRIN XL Take 1 tablet (150 mg total) by mouth every morning.   clobetasol cream 0.05 % Commonly known as:  TEMOVATE APPLY AS DIRECTED.   clopidogrel 75 MG tablet Commonly known as:   PLAVIX Take 1 tablet (75 mg total) by mouth daily with breakfast.   imipramine 10 MG tablet Commonly known as:  TOFRANIL Take 1 tablet (10 mg total) by mouth at bedtime.   levothyroxine 88 MCG tablet Commonly known as:  SYNTHROID, LEVOTHROID TAKE 1 TABLET ONCE DAILY.   metoprolol succinate 25 MG 24 hr tablet Commonly known as:  TOPROL-XL TAKE 1 TABLET ONCE DAILY.   nitroGLYCERIN 0.4 MG SL tablet Commonly known as:  NITROSTAT Place 1 tablet (0.4 mg total) under the tongue every 5 (five) minutes as needed for chest pain (CP or SOB).   ramipril 1.25 MG capsule Commonly known as:  ALTACE Take 1 capsule (1.25 mg total) by mouth daily.   tamsulosin 0.4 MG Caps capsule Commonly known as:  FLOMAX TAKE (1) CAPSULE DAILY.       Review of Systems    Long-standing hypogonadism: He has been treated by his urologist.  From his urologist he has been treated with a custom formulated testosterone cream instead of Testopel pellet since he has been on Plavix He did have adequate levels on the Testopel Apparently has felt a little better with his energy with increasing the dose from 5 g to 10 g which he  has supply and regularly However his level is still low, does have an appointment in December    Lab Results  Component Value Date   TESTOSTERONE 197.11 (L) 07/02/2016     Hypercholesterolemia: This has been long-standing Since his MI he has been on 40 mg Lipitor, previously on 10 mg No side effects with this LDL is below 70   Lab Results  Component Value Date   CHOL 144 07/02/2016   HDL 57.10 07/02/2016   LDLCALC 66 07/02/2016   TRIG 104.0 07/02/2016   CHOLHDL 3 07/02/2016      BPH: He still takes 0.8 mg of Flomax from his urologist With fairly good control Of symptoms       Objective:   Physical Exam BP 118/60   Pulse 66   Ht 5\' 8"  (1.727 m)   Wt 189 lb (85.7 kg)   BMI 28.74 kg/m      Assessment:      Hypogonadism: His testosterone level is relatively  low, does have some fatigue but improved with the 10 g dose and followed by urologist. Discussed that if he prefers to have the ability can still do this with holding his Plavix for 5 days  Hypothyroidism: Adequately replaced as of 7/17 and will recheck on the next visit  Coronary artery disease: Recent nuclear scan shows no evidence of ischemia and normal ejection fraction  Hypercholesterolemia on treatment: Adequately controlled with 40 mg  Impression: Adequately controlled and followed by psychiatrist     Plan:      No change in medications  More regular exercise  Follow-up in 4 months for wellness visit  Hypogonadism: Labs sent to urologist, he will call their office for further discussion    [facility]

## 2016-07-07 NOTE — Patient Instructions (Signed)
Discuss Testosterone with Urologist

## 2016-07-24 ENCOUNTER — Other Ambulatory Visit (HOSPITAL_COMMUNITY): Payer: Self-pay

## 2016-07-24 ENCOUNTER — Ambulatory Visit (HOSPITAL_COMMUNITY): Payer: Medicare Other

## 2016-07-26 ENCOUNTER — Other Ambulatory Visit: Payer: Self-pay | Admitting: Endocrinology

## 2016-07-30 DIAGNOSIS — E291 Testicular hypofunction: Secondary | ICD-10-CM | POA: Diagnosis not present

## 2016-08-07 ENCOUNTER — Ambulatory Visit (HOSPITAL_COMMUNITY): Payer: Self-pay | Admitting: Psychiatry

## 2016-08-14 ENCOUNTER — Encounter (HOSPITAL_COMMUNITY): Payer: Self-pay | Admitting: Psychiatry

## 2016-08-14 ENCOUNTER — Ambulatory Visit (INDEPENDENT_AMBULATORY_CARE_PROVIDER_SITE_OTHER): Payer: Medicare Other | Admitting: Psychiatry

## 2016-08-14 ENCOUNTER — Other Ambulatory Visit: Payer: Self-pay | Admitting: Endocrinology

## 2016-08-14 VITALS — BP 120/70 | HR 76 | Ht 68.0 in | Wt 185.8 lb

## 2016-08-14 DIAGNOSIS — Z9889 Other specified postprocedural states: Secondary | ICD-10-CM | POA: Diagnosis not present

## 2016-08-14 DIAGNOSIS — F331 Major depressive disorder, recurrent, moderate: Secondary | ICD-10-CM

## 2016-08-14 DIAGNOSIS — Z823 Family history of stroke: Secondary | ICD-10-CM

## 2016-08-14 DIAGNOSIS — Z8249 Family history of ischemic heart disease and other diseases of the circulatory system: Secondary | ICD-10-CM

## 2016-08-14 DIAGNOSIS — Z833 Family history of diabetes mellitus: Secondary | ICD-10-CM

## 2016-08-14 DIAGNOSIS — Z87891 Personal history of nicotine dependence: Secondary | ICD-10-CM

## 2016-08-14 DIAGNOSIS — Z79899 Other long term (current) drug therapy: Secondary | ICD-10-CM

## 2016-08-14 DIAGNOSIS — Z7982 Long term (current) use of aspirin: Secondary | ICD-10-CM

## 2016-08-14 DIAGNOSIS — F3342 Major depressive disorder, recurrent, in full remission: Secondary | ICD-10-CM

## 2016-08-14 MED ORDER — BUPROPION HCL ER (XL) 150 MG PO TB24: 150.0000 mg | ORAL_TABLET | ORAL | 5 refills | Status: DC

## 2016-08-14 MED ORDER — IMIPRAMINE HCL 10 MG PO TABS: 10.0000 mg | ORAL_TABLET | Freq: Every day | ORAL | 4 refills | Status: DC

## 2016-08-14 MED ORDER — VENLAFAXINE HCL ER 75 MG PO CP24: 150.0000 mg | ORAL_CAPSULE | Freq: Every day | ORAL | 5 refills | Status: DC

## 2016-08-14 MED ORDER — ALPRAZOLAM 0.5 MG PO TABS: ORAL_TABLET | ORAL | 5 refills | Status: DC

## 2016-08-14 NOTE — Progress Notes (Signed)
Patient ID: COMER BUYER, male   DOB: May 14, 1933, 82 y.o.   MRN: [id number] [facility] MD/PA/NP OP Progress Note  08/14/2016 9:04 AM [name]  MRN:  [id number]  Chief Complaint:  Subjective: Doing great Diagnosis : Major depression chronic, recurrent  Today the patient is doing well. He comes on time. His issues are that he had a move from a 8 room office down to 1 room office. He realizes that he is kind of a hoarder.  His biggest concerns is the fact that he feels like he has not enough energy. He also is very concerned about his financial status. With the other hand the patient denies daily depression at all. He is sleeping and eating well. He's got good ability to concentrate. He denies being suicidal. He drinks 2 glasses of liquor every night. The patient once again made an air with his medicines. He did not start his Effexor as we talked about. I shared with him that taking Effexor main fact help his anxiety. He is agreed to do so. Overall his health is good as is his wife's. The patient has issues with his sex drive and presently has tried a number of different testosterone preparations without success. His particular concern today is the fact that he has a flashback of an old girlfriend that he's actually made some contact with inadvertently. He was just contacted I writing but not much verbal contact and no physical contact. The patient however feels preoccupied with this old girlfriend. He is not seen her and 50 years. He thinks about her almost every other day. It is bothersome to him. Generally though the patient is emotionally quite well and is physically quite well. He is very active.  Past Psychiatric History:    Past Medical History:  Diagnosis Date  . Anxiety   . Arthritis    might be in back, no problems  . BPH (benign prostatic hyperplasia)   . Depression 1990s  . GERD (gastroesophageal reflux disease)    occasional  . Hypercholesteremia    controled  . Hypothyroid    . Shingles May 2014   "Right face, still has some"  . Temporal arteritis ([place])   . Transient blindness of both eyes     Past Surgical History:  Procedure Laterality Date  . ARTERY BIOPSY Right 06/20/2013   Procedure: BIOPSY TEMPORAL ARTERY RIGHT;  Surgeon: Earnstine Regal, MD;  Location: [facility];  Service: General;  Laterality: Right;  . CARDIAC CATHETERIZATION N/A 09/02/2015   Procedure: Left Heart Cath and Coronary Angiography;  Surgeon: Charolette Forward, MD;  Location: [place] [facility];  Service: Cardiovascular;  Laterality: N/A;  . CARDIAC CATHETERIZATION N/A 09/02/2015   Procedure: Coronary Stent Intervention;  Surgeon: Charolette Forward, MD;  Location: [place] [facility];  Service: Cardiovascular;  Laterality: N/A;  1.  mid RCA      (3.0/28mm Xience) 2.  Mid LAD      (3.0/23mm Xience)  . NO PAST SURGERIES    . None      Family Psychiatric History:   Family History:  Family History  Problem Relation Age of Onset  . Stroke Father     Died, 80s  . Stroke Sister     Living, 104  . Heart disease Mother     Died, 15  . Healthy Daughter   . Diabetes Maternal Grandmother   . Hypertension Neg Hx     Social History:  Social History   Social History  .  Marital status: Married    Spouse name: N/A  . Number of children: N/A  . Years of education: N/A   Social History Main Topics  . Smoking status: Former Smoker    Types: Cigarettes    Quit date: 08/24/1966  . Smokeless tobacco: Never Used  . Alcohol use 1.2 oz/week    2 Shots of liquor per week     Comment: socially  . Drug use: No  . Sexual activity: Not Asked   Other Topics Concern  . None   Social History Narrative   Currently works as a Midwife.  Lives with wife and they have two healthy daughters.    Allergies: No Known Allergies  Metabolic Disorder Labs: Lab Results  Component Value Date   HGBA1C 6.1 06/01/2013   No results found for: PROLACTIN Lab Results  Component Value Date   CHOL 144 07/02/2016    TRIG 104.0 07/02/2016   HDL 57.10 07/02/2016   CHOLHDL 3 07/02/2016   VLDL 20.8 07/02/2016   LDLCALC 66 07/02/2016   LDLCALC 87 08/31/2015     Current Medications: Current Outpatient Prescriptions  Medication Sig Dispense Refill  . acetaminophen (TYLENOL) 500 MG tablet Take 500 mg by mouth daily as needed (pain).    [name] ALPRAZolam (XANAX) 0.5 MG tablet 1  qhs 1  q day PRN 45 tablet 5  . aspirin 81 MG chewable tablet Chew 1 tablet (81 mg total) by mouth daily. 30 tablet 3  . atorvastatin (LIPITOR) 40 MG tablet Take 1 tablet (40 mg total) by mouth daily at 6 PM. 30 tablet 3  . buPROPion (WELLBUTRIN XL) 150 MG 24 hr tablet Take 1 tablet (150 mg total) by mouth every morning. 30 tablet 5  . clobetasol cream (TEMOVATE) 0.05 % APPLY AS DIRECTED. 30 g 0  . clopidogrel (PLAVIX) 75 MG tablet Take 1 tablet (75 mg total) by mouth daily with breakfast. 30 tablet 3  . imipramine (TOFRANIL) 10 MG tablet Take 1 tablet (10 mg total) by mouth at bedtime. 30 tablet 4  . levothyroxine (SYNTHROID, LEVOTHROID) 88 MCG tablet TAKE 1 TABLET ONCE DAILY. 30 tablet 0  . metoprolol succinate (TOPROL-XL) 25 MG 24 hr tablet TAKE 1 TABLET ONCE DAILY. (Patient taking differently: TAKE 1/2 TABLET ONCE DAILY.) 30 tablet 3  . nitroGLYCERIN (NITROSTAT) 0.4 MG SL tablet Place 1 tablet (0.4 mg total) under the tongue every 5 (five) minutes as needed for chest pain (CP or SOB). 25 tablet 12  . ramipril (ALTACE) 1.25 MG capsule Take 1 capsule (1.25 mg total) by mouth daily. 30 capsule 3  . tamsulosin (FLOMAX) 0.4 MG CAPS capsule TAKE (1) CAPSULE DAILY. (Patient taking differently: TAKE (2) CAPSULES  DAILY AFTER SUPPER) 30 capsule 3  . venlafaxine XR (EFFEXOR XR) 75 MG 24 hr capsule Take 2 capsules (150 mg total) by mouth daily. 30 capsule 5   No current facility-administered medications for this visit.     Neurologic: Headache: No Seizure: No Paresthesias: No  Musculoskeletal: Strength & Muscle Tone: within normal  limits Gait & Station: normal Patient leans: NA  Psychiatric Specialty Exam: ROS  Blood pressure 120/70, pulse 76, height 5\' 8"  (1.727 m), weight 185 lb 12.8 oz (84.3 kg).Body mass index is 28.25 kg/m.  General Appearance: Fairly Groomed  Eye Contact:  Good  Speech:  Clear and Coherent  Volume:  Normal  Mood:  Euthymic  Affect:  Appropriate  Thought Process:  Coherent  Orientation:  Full (Time, Place, and Person)  Thought Content:  WDL  Suicidal Thoughts:  No  Homicidal Thoughts:  No  Memory:  NA  Judgement:  Good  Insight:  Good  Psychomotor Activity:  Normal  Concentration:  Good  Recall:  Good  Fund of Knowledge: Good  Language: Good  Akathisia:  No  Handed:  Right  AIMS (if indicated):    Assets:  Desire for Improvement  ADL's:  Intact  Cognition: WNL  Sleep:       Treatment Plan Summary:At this time the patient is doing fairly well. We had a long discussion of his medications. I shared with him that I would rather not be on imipramine for long-term basis given its anticholinergic effects area on the other hand he denies sedation constipation or blurred vision. For now we'll continue it but we will make some changes. We will start him back on Effexor at a lower dose of 75 mg XR and changes Wellbutrin from 100 mg slow release to dose of 150 mg XL. He'll take Effexor and Wellbutrin in the morning. He'll continue taking Xanax 0.5 mg at night which helps him sleep. For now he'll continue low-dose imipramine 10 mg at night. At the incident significant dose and probably is no reason to make any changes with it. Today the patient did not come in with his wife. Is getting along well with her and seems to be a nonissue. The patient is doing well. He denies any chest pain these days. He has no shortness of breath. He is in pretty good shape. He denies any neurological symptoms.   Haskel Schroeder, MD 08/14/2016, 9:04 AM [facility] MD Progress Note  08/14/2016 9:04 AM [name]  MRN:  [id number] Subjective:  Feels well Principal Problem: Major depression, chronic, mild/residual Diagnosis:  Major depression, recurrent residual Today the patient is doing fairly well. He's been having some medical problems. He recently went for Thanksgiving to a length but unfortunately had a urinary tract infection. He also since then has been coughing and recently started on antibiotic. Emotionally he is fairly stable. He denies daily depression. He stays very active at work. He's been unable to exercise. The patient is sleeping well and has an increase in his appetite. His energy is good. Is no problems thinking or concentrating. He is recently having a productive cough but is no shortness of breath. He denies any chest pain. Is a stable relationship with his wife. His kidneys are good. He has 4 grandchildren. Everybody in his home is good. He only issue is that a number mornings he awakens and he feels oversedated. It occurs point of his wife feels uncomfortable with him driving will taken to work. Today reviewed his medications and is evident that he likely needs reduction. He also notes that his memory has change in his and is good. 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 Patient Active Problem List   Diagnosis Date Noted  . Acute coronary syndrome ([place]) [I24.9] 08/30/2015  . Temporal arteritis ([place]) [M31.6] 06/16/2013  . Amaurosis fugax [G45.3] 06/01/2013  . Unspecified hypothyroidism [E03.9] 03/14/2013  . Other and unspecified hyperlipidemia [E78.5] 03/14/2013  . BPH (benign prostatic hypertrophy) [N40.0] 03/14/2013  . Hypogonadism, male [E29.1] 03/14/2013  . Erectile dysfunction [N52.9] 03/14/2013   Total Time spent with patient: 30 minutes  Past Psychiatric History:   Past Medical History:  Past Medical History:  Diagnosis Date  . Anxiety   . Arthritis  might be in back, no problems  . BPH (benign prostatic  hyperplasia)   . Depression 1990s  . GERD (gastroesophageal reflux disease)    occasional  . Hypercholesteremia    controled  . Hypothyroid   . Shingles May 2014   "Right face, still has some"  . Temporal arteritis ([place])   . Transient blindness of both eyes     Past Surgical History:  Procedure Laterality Date  . ARTERY BIOPSY Right 06/20/2013   Procedure: BIOPSY TEMPORAL ARTERY RIGHT;  Surgeon: Earnstine Regal, MD;  [facility]: [facility];  Service: General;  Laterality: Right;  . CARDIAC CATHETERIZATION N/A 09/02/2015   Procedure: Left Heart Cath and Coronary Angiography;  Surgeon: Charolette Forward, MD;  Location: [place] [facility];  Service: Cardiovascular;  Laterality: N/A;  . CARDIAC CATHETERIZATION N/A 09/02/2015   Procedure: Coronary Stent Intervention;  Surgeon: Charolette Forward, MD;  Location: [place] [facility];  Service: Cardiovascular;  Laterality: N/A;  1.  mid RCA      (3.0/28mm Xience) 2.  Mid LAD      (3.0/23mm Xience)  . NO PAST SURGERIES    . None     Family History:  Family History  Problem Relation Age of Onset  . Stroke Father     Died, 80s  . Stroke Sister     Living, 60  . Heart disease Mother     Died, 37  . Healthy Daughter   . Diabetes Maternal Grandmother   . Hypertension Neg Hx    Family Psychiatric  History:  Social History:  History  Alcohol Use  . 1.2 oz/week  . 2 Shots of liquor per week    Comment: socially     History  Drug Use No    Social History   Social History  . Marital status: Married    Spouse name: N/A  . Number of children: N/A  . Years of education: N/A   Social History Main Topics  . Smoking status: Former Smoker    Types: Cigarettes    Quit date: 08/24/1966  . Smokeless tobacco: Never Used  . Alcohol use 1.2 oz/week    2 Shots of liquor per week     Comment: socially  . Drug use: No  . Sexual activity: Not Asked   Other Topics Concern  . None   Social History Narrative   Currently works as a Midwife.  Lives with  wife and they have two healthy daughters.   Additional Social History:                         Sleep: Good  Appetite:  Good  Current Medications: Current Outpatient Prescriptions  Medication Sig Dispense Refill  . acetaminophen (TYLENOL) 500 MG tablet Take 500 mg by mouth daily as needed (pain).    [name] ALPRAZolam (XANAX) 0.5 MG tablet 1  qhs 1  q day PRN 45 tablet 5  . aspirin 81 MG chewable tablet Chew 1 tablet (81 mg total) by mouth daily. 30 tablet 3  . atorvastatin (LIPITOR) 40 MG tablet Take 1 tablet (40 mg total) by mouth daily at 6 PM. 30 tablet 3  . buPROPion (WELLBUTRIN XL) 150 MG 24 hr tablet Take 1 tablet (150 mg total) by mouth every morning. 30 tablet 5  . clobetasol cream (TEMOVATE) 0.05 % APPLY AS DIRECTED. 30 g 0  . clopidogrel (PLAVIX) 75 MG tablet Take 1 tablet (75 mg total) by mouth  daily with breakfast. 30 tablet 3  . imipramine (TOFRANIL) 10 MG tablet Take 1 tablet (10 mg total) by mouth at bedtime. 30 tablet 4  . levothyroxine (SYNTHROID, LEVOTHROID) 88 MCG tablet TAKE 1 TABLET ONCE DAILY. 30 tablet 0  . metoprolol succinate (TOPROL-XL) 25 MG 24 hr tablet TAKE 1 TABLET ONCE DAILY. (Patient taking differently: TAKE 1/2 TABLET ONCE DAILY.) 30 tablet 3  . nitroGLYCERIN (NITROSTAT) 0.4 MG SL tablet Place 1 tablet (0.4 mg total) under the tongue every 5 (five) minutes as needed for chest pain (CP or SOB). 25 tablet 12  . ramipril (ALTACE) 1.25 MG capsule Take 1 capsule (1.25 mg total) by mouth daily. 30 capsule 3  . tamsulosin (FLOMAX) 0.4 MG CAPS capsule TAKE (1) CAPSULE DAILY. (Patient taking differently: TAKE (2) CAPSULES  DAILY AFTER SUPPER) 30 capsule 3  . venlafaxine XR (EFFEXOR XR) 75 MG 24 hr capsule Take 2 capsules (150 mg total) by mouth daily. 30 capsule 5   No current facility-administered medications for this visit.     Lab Results:  No results found for this or any previous visit (from the past 48 hour(s)).  Physical Findings: AIMS:  , ,  ,  ,     CIWA:    COWS:     Musculoskeletal: Strength & Muscle Tone: within normal limits Gait & Station: normal Patient leans: Right  Psychiatric Specialty Exam: ROS  Blood pressure 120/70, pulse 76, height 5\' 8"  (1.727 m), weight 185 lb 12.8 oz (84.3 kg).Body mass index is 28.25 kg/m.  General Appearance: Negative  Eye Contact::  Good  Speech:  Clear and Coherent  Volume:  Normal  Mood:  NA  Affect:  Appropriate  Thought Process:  Coherent  Orientation:  Full (Time, Place, and Person)  Thought Content:  WDL  Suicidal Thoughts:  No  Homicidal Thoughts:  No  Memory:  NA  Judgement:  Good  Insight:  Good  Psychomotor Activity:  Normal  Concentration:  Good  Recall:  Good  Fund of Knowledge:Good  Language: Good  Akathisia:  No  Handed:  Right  AIMS (if indicated):     Assets:  Desire for Improvement  ADL's:  Intact  Cognition: WNL  Sleep:      Treatment Plan Summary: At this time the patient will continue taking Wellbutrin 150 mg XL each morning. He'll go ahead and add to this Effexor 75 mg X are. He'll continue taking Xanax of small dose. He takes a 0.5 mg pill and takes actually half of it when he goes to bed at night and another half if he should wake up. I think is perfectly okay that he takes 1 or 2 of the Xanax pills a day but not more than that. Given the total dose of 45 pills and no more. I will take over his imipramine 10 mg daily at bedtime. So he'll be on actually 3 antidepressants low-dose Wellbutrin, low-dose Effexor, low-dose imipramine. The patient wishes to come back in 3 months we'll talk more about his fixation and preoccupation with this old girlfriend that he can get out of his mind. The possibility of referring him to a therapist for example Mr. Bobb myelin will be considered. At this time the patient is not suicidal or homicidal and is functioning at a very high level.  Charlestine Night Dvontae Ruan 08/14/2016, 9:04 AM [facility] MD Progress Note  08/14/2016 9:04  AM [name]  MRN:  [id number] Subjective:  Feels well Principal Problem: Major depression, chronic,  mild/residual Diagnosis:  Major depression, recurrent residual Today the patient is doing fairly well. He's been having some medical problems. He recently went for Thanksgiving to a length but unfortunately had a urinary tract infection. He also since then has been coughing and recently started on antibiotic. Emotionally he is fairly stable. He denies daily depression. He stays very active at work. He's been unable to exercise. The patient is sleeping well and has an increase in his appetite. His energy is good. Is no problems thinking or concentrating. He is recently having a productive cough but is no shortness of breath. He denies any chest pain. Is a stable relationship with his wife. His kidneys are good. He has 4 grandchildren. Everybody in his home is good. He only issue is that a number mornings he awakens and he feels oversedated. It occurs point of his wife feels uncomfortable with him driving will taken to work. Today reviewed his medications and is evident that he likely needs reduction. He also notes that his memory has change in his and is good. 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 Patient Active Problem List   Diagnosis Date Noted  . Acute coronary syndrome ([place]) [I24.9] 08/30/2015  . Temporal arteritis ([place]) [M31.6] 06/16/2013  . Amaurosis fugax [G45.3] 06/01/2013  . Unspecified hypothyroidism [E03.9] 03/14/2013  . Other and unspecified hyperlipidemia [E78.5] 03/14/2013  . BPH (benign prostatic hypertrophy) [N40.0] 03/14/2013  . Hypogonadism, male [E29.1] 03/14/2013  . Erectile dysfunction [N52.9] 03/14/2013   Total Time spent with patient: 30 minutes  Past Psychiatric History:   Past Medical History:  Past Medical History:  Diagnosis Date  . Anxiety   . Arthritis    might be in back, no problems  . BPH (benign  prostatic hyperplasia)   . Depression 1990s  . GERD (gastroesophageal reflux disease)    occasional  . Hypercholesteremia    controled  . Hypothyroid   . Shingles May 2014   "Right face, still has some"  . Temporal arteritis ([place])   . Transient blindness of both eyes     Past Surgical History:  Procedure Laterality Date  . ARTERY BIOPSY Right 06/20/2013   Procedure: BIOPSY TEMPORAL ARTERY RIGHT;  Surgeon: Earnstine Regal, MD;  [facility];  Service: General;  Laterality: Right;  . CARDIAC CATHETERIZATION N/A 09/02/2015   Procedure: Left Heart Cath and Coronary Angiography;  Surgeon: Charolette Forward, MD;  Location: [place] [facility];  Service: Cardiovascular;  Laterality: N/A;  . CARDIAC CATHETERIZATION N/A 09/02/2015   Procedure: Coronary Stent Intervention;  Surgeon: Charolette Forward, MD;  Location: [place] [facility];  Service: Cardiovascular;  Laterality: N/A;  1.  mid RCA      (3.0/28mm Xience) 2.  Mid LAD      (3.0/23mm Xience)  . NO PAST SURGERIES    . None     Family History:  Family History  Problem Relation Age of Onset  . Stroke Father     Died, 80s  . Stroke Sister     Living, 12  . Heart disease Mother     Died, 26  . Healthy Daughter   . Diabetes Maternal Grandmother   . Hypertension Neg Hx    Family Psychiatric  History:  Social History:  History  Alcohol Use  . 1.2 oz/week  . 2 Shots of liquor per week    Comment: socially     History  Drug Use No  Social History   Social History  . Marital status: Married    Spouse name: N/A  . Number of children: N/A  . Years of education: N/A   Social History Main Topics  . Smoking status: Former Smoker    Types: Cigarettes    Quit date: 08/24/1966  . Smokeless tobacco: Never Used  . Alcohol use 1.2 oz/week    2 Shots of liquor per week     Comment: socially  . Drug use: No  . Sexual activity: Not Asked   Other Topics Concern  . None   Social History Narrative   Currently works as a Midwife.   Lives with wife and they have two healthy daughters.   Additional Social History:                         Sleep: Good  Appetite:  Good  Current Medications: Current Outpatient Prescriptions  Medication Sig Dispense Refill  . acetaminophen (TYLENOL) 500 MG tablet Take 500 mg by mouth daily as needed (pain).    [name] ALPRAZolam (XANAX) 0.5 MG tablet 1  qhs 1  q day PRN 45 tablet 5  . aspirin 81 MG chewable tablet Chew 1 tablet (81 mg total) by mouth daily. 30 tablet 3  . atorvastatin (LIPITOR) 40 MG tablet Take 1 tablet (40 mg total) by mouth daily at 6 PM. 30 tablet 3  . buPROPion (WELLBUTRIN XL) 150 MG 24 hr tablet Take 1 tablet (150 mg total) by mouth every morning. 30 tablet 5  . clobetasol cream (TEMOVATE) 0.05 % APPLY AS DIRECTED. 30 g 0  . clopidogrel (PLAVIX) 75 MG tablet Take 1 tablet (75 mg total) by mouth daily with breakfast. 30 tablet 3  . imipramine (TOFRANIL) 10 MG tablet Take 1 tablet (10 mg total) by mouth at bedtime. 30 tablet 4  . levothyroxine (SYNTHROID, LEVOTHROID) 88 MCG tablet TAKE 1 TABLET ONCE DAILY. 30 tablet 0  . metoprolol succinate (TOPROL-XL) 25 MG 24 hr tablet TAKE 1 TABLET ONCE DAILY. (Patient taking differently: TAKE 1/2 TABLET ONCE DAILY.) 30 tablet 3  . nitroGLYCERIN (NITROSTAT) 0.4 MG SL tablet Place 1 tablet (0.4 mg total) under the tongue every 5 (five) minutes as needed for chest pain (CP or SOB). 25 tablet 12  . ramipril (ALTACE) 1.25 MG capsule Take 1 capsule (1.25 mg total) by mouth daily. 30 capsule 3  . tamsulosin (FLOMAX) 0.4 MG CAPS capsule TAKE (1) CAPSULE DAILY. (Patient taking differently: TAKE (2) CAPSULES  DAILY AFTER SUPPER) 30 capsule 3  . venlafaxine XR (EFFEXOR XR) 75 MG 24 hr capsule Take 2 capsules (150 mg total) by mouth daily. 30 capsule 5   No current facility-administered medications for this visit.     Lab Results:  No results found for this or any previous visit (from the past 48 hour(s)).  Physical Findings: AIMS:   , ,  ,  ,    CIWA:    COWS:     Musculoskeletal: Strength & Muscle Tone: within normal limits Gait & Station: normal Patient leans: Right  Psychiatric Specialty Exam: ROS  Blood pressure 120/70, pulse 76, height 5\' 8"  (1.727 m), weight 185 lb 12.8 oz (84.3 kg).Body mass index is 28.25 kg/m.  General Appearance: Negative  Eye Contact::  Good  Speech:  Clear and Coherent  Volume:  Normal  Mood:  NA  Affect:  Appropriate  Thought Process:  Coherent  Orientation:  Full (Time, Place, and Person)  Thought Content:  WDL  Suicidal Thoughts:  No  Homicidal Thoughts:  No  Memory:  NA  Judgement:  Good  Insight:  Good  Psychomotor Activity:  Normal  Concentration:  Good  Recall:  Good  Fund of Knowledge:Good  Language: Good  Akathisia:  No  Handed:  Right  AIMS (if indicated):     Assets:  Desire for Improvement  ADL's:  Intact  Cognition: WNL  Sleep:      Treatment Plan Summary:  At this time the patient is doing great. He'll continue taking Wellbutrin 150 mg XL in the morning. He doesn't take Effexor anymore. I shared within the last medicine that better. He does take a small dose of Xanax 0.5 mg half at night together with 5 mg of melatonin which helps him sleep. He also takes only 10 mg of the never been which also helps him. His #1 problem therefore is clinical depression which is much resolved at this time. The patient is sleeping well on the present medications. His second problem is erectile dysfunction which is getting testosterone replacement from his primary care doctor is third problem is a history of an MI. At this time is well-controlled in terms of chest pain and shortness of breath. Is no physical complaints at all. This patient was asked to return in 5 months with his wife will get her perspective. The patient denies any neurological symptoms. He certainly is not suicidal. He is functioning extremely well. This particular true an 82 year old gentleman. 08/14/2016,  9:04 AM

## 2016-08-25 ENCOUNTER — Other Ambulatory Visit: Payer: Self-pay | Admitting: Endocrinology

## 2016-08-25 DIAGNOSIS — N5201 Erectile dysfunction due to arterial insufficiency: Secondary | ICD-10-CM | POA: Diagnosis not present

## 2016-08-25 DIAGNOSIS — E291 Testicular hypofunction: Secondary | ICD-10-CM | POA: Diagnosis not present

## 2016-08-26 NOTE — Telephone Encounter (Signed)
No longer prescribed

## 2016-08-27 DIAGNOSIS — E291 Testicular hypofunction: Secondary | ICD-10-CM | POA: Diagnosis not present

## 2016-08-28 NOTE — Telephone Encounter (Signed)
Pt called in and said that he really needs for Dr. Dwyane Dee to renew his Flexeril prescription because he is having really bad neck and back problems.  Please advise.

## 2016-08-28 NOTE — Telephone Encounter (Signed)
See message this is not on his current med list. Thanks!

## 2016-09-02 DIAGNOSIS — E291 Testicular hypofunction: Secondary | ICD-10-CM | POA: Diagnosis not present

## 2016-09-11 DIAGNOSIS — E291 Testicular hypofunction: Secondary | ICD-10-CM | POA: Diagnosis not present

## 2016-09-15 ENCOUNTER — Other Ambulatory Visit: Payer: Self-pay | Admitting: Endocrinology

## 2016-09-15 ENCOUNTER — Telehealth: Payer: Self-pay | Admitting: Endocrinology

## 2016-09-15 MED ORDER — AZITHROMYCIN 250 MG PO TABS: ORAL_TABLET | ORAL | 0 refills | Status: DC

## 2016-09-15 NOTE — Telephone Encounter (Signed)
Z-pack called in, still need to know exact symptoms

## 2016-09-15 NOTE — Telephone Encounter (Signed)
Patient is having all three symptoms he said, and would like you to call it in his z pack by 5:00 today he it very upset and very mean about it.

## 2016-09-15 NOTE — Telephone Encounter (Signed)
Please find out if he is having any fever, wheezing or sputum and what color.

## 2016-09-15 NOTE — Telephone Encounter (Signed)
Pt needs prednisone or a zpack  please a deep cough has started to form and he is in need of this to be called into [facility]

## 2016-09-16 ENCOUNTER — Other Ambulatory Visit: Payer: Self-pay

## 2016-09-16 DIAGNOSIS — E291 Testicular hypofunction: Secondary | ICD-10-CM | POA: Diagnosis not present

## 2016-09-16 MED ORDER — PREDNISONE 10 MG PO TABS: ORAL_TABLET | ORAL | 0 refills | Status: DC

## 2016-09-16 NOTE — Telephone Encounter (Signed)
Symptoms are- Coughing (deep non productive)                            Feels feverish but has not taken his temp                            Runny nose                            Upset stomach                            Just feels really bad

## 2016-09-16 NOTE — Telephone Encounter (Signed)
Explained to the patient that he has a viral infection and needs to hold off on taking the antibiotic.  For now he can use a cough suppressant medication and decongestant spray

## 2016-09-16 NOTE — Telephone Encounter (Signed)
Patient called [facility] stated he had a croupy cough  asking if he could have prednisone please advise

## 2016-09-16 NOTE — Telephone Encounter (Signed)
Prednisone 10 mg: 2 tablets today and tomorrow and then 1 tablet daily for 4 days

## 2016-09-16 NOTE — Telephone Encounter (Signed)
Ordered 09/16/16

## 2016-09-17 DIAGNOSIS — E291 Testicular hypofunction: Secondary | ICD-10-CM | POA: Diagnosis not present

## 2016-09-25 ENCOUNTER — Telehealth: Payer: Self-pay | Admitting: Endocrinology

## 2016-09-25 ENCOUNTER — Other Ambulatory Visit: Payer: Self-pay | Admitting: Endocrinology

## 2016-09-25 DIAGNOSIS — E785 Hyperlipidemia, unspecified: Secondary | ICD-10-CM | POA: Diagnosis not present

## 2016-09-25 DIAGNOSIS — I25119 Atherosclerotic heart disease of native coronary artery with unspecified angina pectoris: Secondary | ICD-10-CM | POA: Diagnosis not present

## 2016-09-25 DIAGNOSIS — J45909 Unspecified asthma, uncomplicated: Secondary | ICD-10-CM | POA: Diagnosis not present

## 2016-09-25 DIAGNOSIS — I10 Essential (primary) hypertension: Secondary | ICD-10-CM | POA: Diagnosis not present

## 2016-09-25 DIAGNOSIS — E039 Hypothyroidism, unspecified: Secondary | ICD-10-CM | POA: Diagnosis not present

## 2016-09-25 DIAGNOSIS — J029 Acute pharyngitis, unspecified: Secondary | ICD-10-CM | POA: Diagnosis not present

## 2016-09-25 DIAGNOSIS — M1990 Unspecified osteoarthritis, unspecified site: Secondary | ICD-10-CM | POA: Diagnosis not present

## 2016-09-25 MED ORDER — HYDROCOD POLST-CPM POLST ER 10-8 MG/5ML PO SUER: 5.0000 mL | Freq: Two times a day (BID) | ORAL | 0 refills | Status: DC | PRN

## 2016-09-25 MED ORDER — LEVOFLOXACIN 500 MG PO TABS: 500.0000 mg | ORAL_TABLET | Freq: Every day | ORAL | 0 refills | Status: DC

## 2016-09-25 NOTE — Telephone Encounter (Signed)
He is having sputum which is gray in color and persistent cough despite OTC cough medications and he already has taken the Zithromax Prescription sent for Levaquin 500 mg for 7 days and Tussionex 5 ML twice a day as needed

## 2016-09-25 NOTE — Telephone Encounter (Signed)
Pt is requesting help with his cough # [id number]

## 2016-09-29 DIAGNOSIS — E291 Testicular hypofunction: Secondary | ICD-10-CM | POA: Diagnosis not present

## 2016-10-15 DIAGNOSIS — E291 Testicular hypofunction: Secondary | ICD-10-CM | POA: Diagnosis not present

## 2016-10-23 DIAGNOSIS — M25511 Pain in right shoulder: Secondary | ICD-10-CM | POA: Diagnosis not present

## 2016-10-27 DIAGNOSIS — E291 Testicular hypofunction: Secondary | ICD-10-CM | POA: Diagnosis not present

## 2016-11-03 NOTE — Progress Notes (Signed)
Patient ID: Benjamin Hahn, male   DOB: 06/18/1933, 83 y.o.   MRN: [id number]  SUBJECTIVE:  Patient is being seen today for annual wellness visit, complete physical exam and review of chronic problems.   Risk factors: Advancing age, long-standing depression    Roster of Physicians Providing Medical Care to Patient:  See "care team section"  Activities of Daily Living:  In the present state of health, the patient has no difficulty performing the following activities:  Preparing food and eating, Bathing, Getting dressed and taking care of daily personal  needs Patient is able to ambulate but does complain of feeling a little unsteady at times  In the past year the patient has fallen at least once, the last time when he tripped over the carpet at his work  Safety: Has smoke detector and wears seat belts. No excess sun exposure.  Diet, alcohol and Exercise  Current exercise habits: Using elliptical at least 3 days a week for 30 minutes   Dietary issues discussed: heart healthy diet. Has  generally avoidant high-fat and high carbohydrate meals Alcohol: About  1 drink daily, previously drinking 2 drinks daily, 1.5 ounces at a time  Depression Screen:  Results available in the depression screening section  Advance directives:  None available on chart Discussed need to have this documented      Allergies as of 11/04/2016   No Known Allergies     Medication List       Accurate as of 11/04/16  8:25 AM. Always use your most recent med list.          acetaminophen 500 MG tablet Commonly known as:  TYLENOL Take 500 mg by mouth daily as needed (pain).   ALPRAZolam 0.5 MG tablet Commonly known as:  XANAX 1  qhs 1  q day PRN   aspirin 81 MG chewable tablet Chew 1 tablet (81 mg total) by mouth daily.   atorvastatin 40 MG tablet Commonly known as:  LIPITOR Take 1 tablet (40 mg total) by mouth daily at 6 PM.   azithromycin 250 MG tablet Commonly known as:   ZITHROMAX Z-PAK 2 tabs today then 1 daily till gone   buPROPion 150 MG 24 hr tablet Commonly known as:  WELLBUTRIN XL Take 1 tablet (150 mg total) by mouth every morning.   chlorpheniramine-HYDROcodone 10-8 MG/5ML Suer Commonly known as:  TUSSIONEX PENNKINETIC ER Take 5 mLs by mouth every 12 (twelve) hours as needed for cough.   clobetasol cream 0.05 % Commonly known as:  TEMOVATE APPLY AS DIRECTED.   clopidogrel 75 MG tablet Commonly known as:  PLAVIX Take 1 tablet (75 mg total) by mouth daily with breakfast.   imipramine 10 MG tablet Commonly known as:  TOFRANIL Take 1 tablet (10 mg total) by mouth at bedtime.   levofloxacin 500 MG tablet Commonly known as:  LEVAQUIN Take 1 tablet (500 mg total) by mouth daily.   levothyroxine 88 MCG tablet Commonly known as:  SYNTHROID, LEVOTHROID TAKE 1 TABLET ONCE DAILY.   metoprolol succinate 25 MG 24 hr tablet Commonly known as:  TOPROL-XL TAKE 1 TABLET ONCE DAILY.   nitroGLYCERIN 0.4 MG SL tablet Commonly known as:  NITROSTAT Place 1 tablet (0.4 mg total) under the tongue every 5 (five) minutes as needed for chest pain (CP or SOB).   predniSONE 10 MG tablet Commonly known as:  DELTASONE Take 2 tablets today- than take 1 tablet daily for 4 days   ramipril 1.25 MG  capsule Commonly known as:  ALTACE Take 1 capsule (1.25 mg total) by mouth daily.   tamsulosin 0.4 MG Caps capsule Commonly known as:  FLOMAX TAKE (1) CAPSULE DAILY.   venlafaxine XR 75 MG 24 hr capsule Commonly known as:  EFFEXOR XR Take 2 capsules (150 mg total) by mouth daily.       Allergies: No Known Allergies  Past Medical History:  Diagnosis Date  . Anxiety   . Arthritis    might be in back, no problems  . BPH (benign prostatic hyperplasia)   . Depression 1990s  . GERD (gastroesophageal reflux disease)    occasional  . Hypercholesteremia    controled  . Hypothyroid   . Shingles May 2014   "Right face, still has some"  . Temporal  arteritis ([place])   . Transient blindness of both eyes     Past Surgical History:  Procedure Laterality Date  . ARTERY BIOPSY Right 06/20/2013   Procedure: BIOPSY TEMPORAL ARTERY RIGHT;  Surgeon: Earnstine Regal, MD;  Location: [facility];  Service: General;  Laterality: Right;  . CARDIAC CATHETERIZATION N/A 09/02/2015   Procedure: Left Heart Cath and Coronary Angiography;  Surgeon: Charolette Forward, MD;  Location: [facility];  Service: Cardiovascular;  Laterality: N/A;  . CARDIAC CATHETERIZATION N/A 09/02/2015   Procedure: Coronary Stent Intervention;  Surgeon: Charolette Forward, MD;  Location: [facility];  Service: Cardiovascular;  Laterality: N/A;  1.  mid RCA      (3.0/28mm Xience) 2.  Mid LAD      (3.0/23mm Xience)  . NO PAST SURGERIES    . None      Family History  Problem Relation Age of Onset  . Stroke Father     Died, 80s  . Stroke Sister     Living, 44  . Heart disease Mother     Died, 88  . Healthy Daughter   . Diabetes Maternal Grandmother   . Hypertension Neg Hx     Social History:  reports that he quit smoking about 50 years ago. His smoking use included Cigarettes. He has never used smokeless tobacco. He reports that he drinks about 1.2 oz of alcohol per week . He reports that he does not use drugs.   Objective   BP 130/70   Pulse 85   Ht 5' 8.5" (1.74 m)   Wt 190 lb (86.2 kg)   BMI 28.47 kg/m   Vision:  Normal, has annual eye exams   Hearing: grossly normal Body mass index:  See vitals Neurological/balance: He has difficulty getting up from his chair and take some time.  His gait is relatively slow and slightly shuffling Cognitive Impairment Assessment: cognition, memory and judgment appear normal.  remembers 3/3 at 5 minutes. Has excellent recall.  Has had no difficulty reading and writing.   He is very alert and aware of medical and other issues with him  Assessment   Medicare wellness evaluation done   Preventive parameters reviewed   Plan    During the course of the visit the patient was educated and counseled about appropriate screening and preventive services including:        Fall prevention   Diabetes screening  Nutrition counseling Exercise regimen discussed and encouraged him to continue  Lipid screening Colorectal screening to be done with Hemoccults.  May not be necessary to have colonoscopy at his age Prostate exam done today, PSA not indicated at his age Regular eye and dental exams recommended,  needs to make appointment for regular eye exam   Vaccines / LABS Flu vaccine/Zostavax / Pneumococcal Vaccine/Prevnar up-to-date Tetanus booster Up-to-date   Patient Instructions (the written plan) was given to the patient.   [facility] 11/04/16        COMPLETE annual exam and history:    Chief complaint:   Headache   This is a recurrent problem. The current episode started more than 1 month ago. The problem occurs intermittently. The problem has been gradually improving. The pain is located in the bilateral region. The pain does not radiate. The pain quality is not similar to prior headaches. The pain is mild. Pertinent negatives include no abdominal pain, anorexia, back pain, blurred vision, dizziness, numbness, rhinorrhea, tingling, visual change or weakness. He has tried nothing for the symptoms. There is no history of sinus disease.   He thinks he has less headches since he stopped taking meltonin    OTHER problems: .    DEPRESSION: This has been long-standing Followed by psychiatrist Although he was recommended Effexor by the psychiatrist he has not taken this recently and is only taking Wellbutrin. He thinks he does feel depressed mood and feeling low frequently  INSOMNIA:   Has been able to sleep reasonably well with his usual regimen of imipramine and 1/2 Xanax    Hypothyroidism: This is long-standing and he is usually on a stable dose of 88 g.     Lab Results  Component Value Date   TSH 1.81  03/02/2016   TSH 6.500 (H) 08/30/2015   TSH 1.37 04/30/2015   FREET4 0.87 03/02/2016   FREET4 0.79 04/30/2015   FREET4 0.78 01/23/2015        HYPERCHOLESTEROLEMIA: Because of his diagnosis of coronary artery disease he is now on a high intensity statin using 40 mg Lipitor.  Previously has had long-standing hypercholesterolemia treated with low doses of statins including pravastatin.  LDL is below 70    Lab Results  Component Value Date   CHOL 144 07/02/2016   HDL 57.10 07/02/2016   LDLCALC 66 07/02/2016   TRIG 104.0 07/02/2016   CHOLHDL 3 07/02/2016    Long-standing hypogonadism: He has been treated by his urologist.  He has had various treatment regimens in the past More recently has been on testosterone injections twice a month given by the neurologist since he could not get the Testopel pellet with starting Plavix.  Also his insurance does not appear to be paying for this now He did have adequate levels on the Testopel   No labs available from a urologist office recently   Medications, past history, family history and physical history: See above section  Review of Systems  Constitutional: Negative for weight gain.  HENT: Positive for headaches.   Eyes: Negative for blurred vision.  Respiratory: Negative for shortness of breath.   Cardiovascular: Negative for chest pain, leg swelling and claudication.  Gastrointestinal: Negative for constipation and abdominal pain.  Endocrine: Positive for erectile dysfunction.  Genitourinary:       He has had  urinary stream with using Flomax. Has only minimal frequency and nocturia. He is not sure when he last had a prostate exam from urologist  Musculoskeletal: Negative for back pain.  Skin: Negative for rash.  Allergic/Immunologic: Negative for rhinorrhea.  Neurological: Negative for weakness, numbness and tingling.       Recently has had only minor balance difficulties  Psychiatric/Behavioral: Positive for depressed mood.     Physical Exam  Constitutional: He appears well-developed and  well-nourished.  HENT:  Mouth/Throat: Oropharynx is clear and moist.  Eyes: Conjunctivae and EOM are normal.  Optic disks appear normal  Neck: No thyromegaly present.  Cardiovascular: Normal rate, regular rhythm, normal heart sounds and intact distal pulses.  Exam reveals no gallop.   No murmur heard. Pulmonary/Chest: Breath sounds normal. He has no wheezes. He has no rales.  Abdominal: Soft. He exhibits no distension and no mass. There is no tenderness. No hernia.  Genitourinary: Rectum normal.  Genitourinary Comments: Prostate minimally enlarged, mostly in the midline  Musculoskeletal: He exhibits no edema.  Spine shows only minimal thoracic curvature, no kyphosis are prominent spines, no tenderness  Lymphadenopathy:    He has no cervical adenopathy.  Neurological: He displays normal reflexes. No cranial nerve deficit or sensory deficit. He exhibits normal muscle tone.  Skin: Skin is warm. No rash noted. No pallor.  Psychiatric: He has a normal mood and affect.  Vitals reviewed.   ASSESSMENT/PLAN   Headaches: These are mild and not associated with other symptoms, generalized and no visual symptoms.  However because of his previous history will recheck sedimentation rate.  Depression: He is still having some depression and not taking Effexor as prescribed.  Encouraged him to start taking 1 tablet of Effexor XR previously prescribed by psychiatrist.  Hyperlipidemia: This has been well controlled with Lipitor  CORONARY artery disease: No recent pains, followed by cardiologist regularly  Hypothyroidism: Needs follow-up TSH level, no current symptoms suggestive of hypothyroidism  Insomnia: He can continue using Xanax and imipramine  History of BPH: Well controlled with Flomax.  Only has minimal prostate enlargement on exam  Hypogonadism: Managed by urologist.  Unclear whether he has had testosterone level done  with starting testosterone injections.  He subjectively doing well  Stool Hemoccult given  Follow-up in 4 months  Anija Brickner

## 2016-11-04 ENCOUNTER — Ambulatory Visit: Payer: Self-pay | Admitting: Endocrinology

## 2016-11-04 ENCOUNTER — Encounter: Payer: Self-pay | Admitting: Endocrinology

## 2016-11-04 ENCOUNTER — Ambulatory Visit (INDEPENDENT_AMBULATORY_CARE_PROVIDER_SITE_OTHER): Payer: Medicare Other | Admitting: Endocrinology

## 2016-11-04 VITALS — BP 130/70 | HR 85 | Ht 68.5 in | Wt 190.0 lb

## 2016-11-04 DIAGNOSIS — E7800 Pure hypercholesterolemia, unspecified: Secondary | ICD-10-CM | POA: Diagnosis not present

## 2016-11-04 DIAGNOSIS — E038 Other specified hypothyroidism: Secondary | ICD-10-CM

## 2016-11-04 DIAGNOSIS — Z0000 Encounter for general adult medical examination without abnormal findings: Secondary | ICD-10-CM

## 2016-11-04 DIAGNOSIS — R51 Headache: Secondary | ICD-10-CM

## 2016-11-04 DIAGNOSIS — E291 Testicular hypofunction: Secondary | ICD-10-CM

## 2016-11-04 DIAGNOSIS — R519 Headache, unspecified: Secondary | ICD-10-CM

## 2016-11-04 DIAGNOSIS — E063 Autoimmune thyroiditis: Secondary | ICD-10-CM

## 2016-11-04 LAB — COMPREHENSIVE METABOLIC PANEL
ALBUMIN: 4 g/dL (ref 3.5–5.2)
ALT: 12 U/L (ref 0–53)
AST: 17 U/L (ref 0–37)
Alkaline Phosphatase: 46 U/L (ref 39–117)
BUN: 18 mg/dL (ref 6–23)
CHLORIDE: 104 meq/L (ref 96–112)
CO2: 27 meq/L (ref 19–32)
CREATININE: 1.26 mg/dL (ref 0.40–1.50)
Calcium: 9.1 mg/dL (ref 8.4–10.5)
GFR: 58.27 mL/min — ABNORMAL LOW (ref >60.00)
Glucose, Bld: 87 mg/dL (ref 70–99)
Potassium: 3.8 mEq/L (ref 3.5–5.1)
SODIUM: 139 meq/L (ref 135–145)
Total Bilirubin: 0.5 mg/dL (ref 0.2–1.2)
Total Protein: 6.7 g/dL (ref 6.0–8.3)

## 2016-11-04 LAB — CBC
HEMATOCRIT: 40 % (ref 39.0–52.0)
HEMOGLOBIN: 13.1 g/dL (ref 13.0–17.0)
MCHC: 32.7 g/dL (ref 30.0–36.0)
MCV: 94.2 fl (ref 78.0–100.0)
Platelets: 289 10*3/uL (ref 150.0–400.0)
RBC: 4.25 Mil/uL (ref 4.22–5.81)
RDW: 14.5 % (ref 11.5–15.5)
WBC: 6.9 10*3/uL (ref 4.0–10.5)

## 2016-11-04 LAB — TSH: TSH: 2.91 u[IU]/mL (ref 0.35–4.50)

## 2016-11-04 LAB — SEDIMENTATION RATE: Sed Rate: 8 mm/hr (ref 0–20)

## 2016-11-04 LAB — T4, FREE: Free T4: 0.96 ng/dL (ref 0.60–1.60)

## 2016-11-04 NOTE — Progress Notes (Signed)
Please let patient know that the lab result is normal and no further action needed

## 2016-11-04 NOTE — Patient Instructions (Addendum)
Restart 1 Effexor in the morning daily  Continue regular exercise 3-4 days a week  Please schedule eye exam  Please complete the living Will and healthcare power of attorney that was given today  Continue healthy low saturated fat meals  Make sure you have seat belts when you drive  Smoke alarms should be checked regularly at home  Take care to avoid tripping over carpets and rugs

## 2016-11-05 ENCOUNTER — Ambulatory Visit: Payer: Self-pay | Admitting: Endocrinology

## 2016-11-06 ENCOUNTER — Encounter (HOSPITAL_COMMUNITY): Payer: Self-pay | Admitting: Psychiatry

## 2016-11-06 ENCOUNTER — Ambulatory Visit (INDEPENDENT_AMBULATORY_CARE_PROVIDER_SITE_OTHER): Payer: Medicare Other | Admitting: Psychiatry

## 2016-11-06 VITALS — BP 122/76 | HR 81 | Ht 68.0 in | Wt 193.4 lb

## 2016-11-06 DIAGNOSIS — Z87891 Personal history of nicotine dependence: Secondary | ICD-10-CM

## 2016-11-06 DIAGNOSIS — Z7982 Long term (current) use of aspirin: Secondary | ICD-10-CM

## 2016-11-06 DIAGNOSIS — F332 Major depressive disorder, recurrent severe without psychotic features: Secondary | ICD-10-CM

## 2016-11-06 DIAGNOSIS — Z79899 Other long term (current) drug therapy: Secondary | ICD-10-CM

## 2016-11-06 DIAGNOSIS — F3341 Major depressive disorder, recurrent, in partial remission: Secondary | ICD-10-CM

## 2016-11-06 DIAGNOSIS — F331 Major depressive disorder, recurrent, moderate: Secondary | ICD-10-CM

## 2016-11-06 MED ORDER — BUPROPION HCL ER (XL) 150 MG PO TB24: 150.0000 mg | ORAL_TABLET | ORAL | 5 refills | Status: DC

## 2016-11-06 MED ORDER — ALPRAZOLAM 0.5 MG PO TABS: ORAL_TABLET | ORAL | 5 refills | Status: DC

## 2016-11-06 MED ORDER — VENLAFAXINE HCL ER 75 MG PO CP24: 150.0000 mg | ORAL_CAPSULE | Freq: Every day | ORAL | 5 refills | Status: DC

## 2016-11-06 NOTE — Progress Notes (Signed)
Patient ID: Benjamin Hahn, male   DOB: 1932/10/15, 83 y.o.   MRN: [id number] [facility] MD/PA/NP OP Progress Note  11/06/2016 9:12 AM Benjamin Hahn  MRN:  [id number]  Chief Complaint:  Subjective: Doing great Diagnosis : Major depression chronic, recurrent  Today patient is seen on time. Overall the patient is generally at his baseline. Yet at other movements in our discussion he admits to chronic mild depression. Even invasive once while he has a thought of wanting to end his life. It is short-lived and is rare. He's never made an attempt. He denies any significant psychosocial stressors other than the chronic stress feeling like is not financially stable. 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 His wife and his family are doing fine. He likes the home he lives in. He does have a number of close friends and reasonable support system. He started to go back into the gym to start exercising again. Today the patient denies daily depression in a significant way. His mild level of dysphoria does not appear all. He still able to enjoy life. He still gets pleasure of things. He is sleeping well as long she takes a low-dose of Xanax and his imipramine. He is eating well. He's got a reasonably good amount of energy he denies any psychotic symptoms. He's probably related he stopped drinking completely. He was drinking 2 drinks a day. For some reason he is forgotten to take his Effexor every morning. He only takes his Wellbutrin. Today we talked about importance of taking the Effexor every morning. His issue on his last visit about his old girlfriend is now not an issue. He seems to be able to repress all. I shared the concept of having repressed thoughts and concerns and now and may use a sense of feeling like he wants to end his life. The patient is very articulate and he will not attempt to his life. He knows it'll hurt his family.   Past Medical History:   Diagnosis Date  . Anxiety   . Arthritis    might be in back, no problems  . BPH (benign prostatic hyperplasia)   . Depression 1990s  . GERD (gastroesophageal reflux disease)    occasional  . Hypercholesteremia    controled  . Hypothyroid   . Shingles May 2014   "Right face, still has some"  . Temporal arteritis ([place])   . Transient blindness of both eyes     Past Surgical History:  Procedure Laterality Date  . ARTERY BIOPSY Right 06/20/2013   Procedure: BIOPSY TEMPORAL ARTERY RIGHT;  Surgeon: Earnstine Regal, MD;  Location: [facility];  Service: General;  Laterality: Right;  . CARDIAC CATHETERIZATION N/A 09/02/2015   Procedure: Left Heart Cath and Coronary Angiography;  Surgeon: Charolette Forward, MD;  Location: [place] [facility];  Service: Cardiovascular;  Laterality: N/A;  . CARDIAC CATHETERIZATION N/A 09/02/2015   Procedure: Coronary Stent Intervention;  Surgeon: Charolette Forward, MD;  Location: [place] [facility];  Service: Cardiovascular;  Laterality: N/A;  1.  mid RCA      (3.0/28mm Xience) 2.  Mid LAD      (3.0/23mm Xience)  . NO PAST SURGERIES    . None      Family Psychiatric History:   Family History:  Family History  Problem Relation Age of Onset  . Stroke Father     Died, 80s  . Stroke Sister     Living,  78  . Heart disease Mother     Died, 79  . Healthy Daughter   . Diabetes Maternal Grandmother   . Hypertension Neg Hx     Social History:  Social History   Social History  . Marital status: Married    Spouse name: N/A  . Number of children: N/A  . Years of education: N/A   Social History Main Topics  . Smoking status: Former Smoker    Types: Cigarettes    Quit date: 08/24/1966  . Smokeless tobacco: Never Used  . Alcohol use 1.2 oz/week    2 Shots of liquor per week     Comment: socially  . Drug use: No  . Sexual activity: Not Asked   Other Topics Concern  . None   Social History Narrative   Currently works as a Midwife.  Lives with wife and they  have two healthy daughters.    Allergies: No Known Allergies  Metabolic Disorder Labs: Lab Results  Component Value Date   HGBA1C 6.1 06/01/2013   No results found for: PROLACTIN Lab Results  Component Value Date   CHOL 144 07/02/2016   TRIG 104.0 07/02/2016   HDL 57.10 07/02/2016   CHOLHDL 3 07/02/2016   VLDL 20.8 07/02/2016   LDLCALC 66 07/02/2016   LDLCALC 87 08/31/2015     Current Medications: Current Outpatient Prescriptions  Medication Sig Dispense Refill  . acetaminophen (TYLENOL) 500 MG tablet Take 500 mg by mouth daily as needed (pain).    [name] ALPRAZolam (XANAX) 0.5 MG tablet 1  qhs 1  q day PRN 45 tablet 5  . aspirin 81 MG chewable tablet Chew 1 tablet (81 mg total) by mouth daily. 30 tablet 3  . atorvastatin (LIPITOR) 40 MG tablet Take 1 tablet (40 mg total) by mouth daily at 6 PM. 30 tablet 3  . azithromycin (ZITHROMAX Z-PAK) 250 MG tablet 2 tabs today then 1 daily till gone (Patient not taking: Reported on 11/04/2016) 6 each 0  . buPROPion (WELLBUTRIN XL) 150 MG 24 hr tablet Take 1 tablet (150 mg total) by mouth every morning. 30 tablet 5  . chlorpheniramine-HYDROcodone (TUSSIONEX PENNKINETIC ER) 10-8 MG/5ML SUER Take 5 mLs by mouth every 12 (twelve) hours as needed for cough. (Patient not taking: Reported on 11/04/2016) 115 mL 0  . clobetasol cream (TEMOVATE) 0.05 % APPLY AS DIRECTED. 30 g 0  . clopidogrel (PLAVIX) 75 MG tablet Take 1 tablet (75 mg total) by mouth daily with breakfast. 30 tablet 3  . imipramine (TOFRANIL) 10 MG tablet Take 1 tablet (10 mg total) by mouth at bedtime. 30 tablet 4  . levofloxacin (LEVAQUIN) 500 MG tablet Take 1 tablet (500 mg total) by mouth daily. (Patient not taking: Reported on 11/04/2016) 7 tablet 0  . levothyroxine (SYNTHROID, LEVOTHROID) 88 MCG tablet TAKE 1 TABLET ONCE DAILY. 30 tablet 3  . metoprolol succinate (TOPROL-XL) 25 MG 24 hr tablet TAKE 1 TABLET ONCE DAILY. (Patient taking differently: TAKE 1/2 TABLET ONCE DAILY.) 30 tablet  3  . nitroGLYCERIN (NITROSTAT) 0.4 MG SL tablet Place 1 tablet (0.4 mg total) under the tongue every 5 (five) minutes as needed for chest pain (CP or SOB). 25 tablet 12  . predniSONE (DELTASONE) 10 MG tablet Take 2 tablets today- than take 1 tablet daily for 4 days (Patient not taking: Reported on 11/04/2016) 6 tablet 0  . ramipril (ALTACE) 1.25 MG capsule Take 1 capsule (1.25 mg total) by mouth daily. 30 capsule 3  .  tamsulosin (FLOMAX) 0.4 MG CAPS capsule TAKE (1) CAPSULE DAILY. (Patient taking differently: TAKE (2) CAPSULES  DAILY AFTER SUPPER) 30 capsule 3  . venlafaxine XR (EFFEXOR XR) 75 MG 24 hr capsule Take 2 capsules (150 mg total) by mouth daily. 30 capsule 5   No current facility-administered medications for this visit.     Neurologic: Headache: No Seizure: No Paresthesias: No  Musculoskeletal: Strength & Muscle Tone: within normal limits Gait & Station: normal Patient leans: NA  Psychiatric Specialty Exam: ROS  Blood pressure 122/76, pulse 81, height 5\' 8"  (1.727 m), weight 193 lb 6.4 oz (87.7 kg).Body mass index is 29.41 kg/m.  General Appearance: Fairly Groomed  Eye Contact:  Good  Speech:  Clear and Coherent  Volume:  Normal  Mood:  Euthymic  Affect:  Appropriate  Thought Process:  Coherent  Orientation:  Full (Time, Place, and Person)  Thought Content:  WDL  Suicidal Thoughts:  No  Homicidal Thoughts:  No  Memory:  NA  Judgement:  Good  Insight:  Good  Psychomotor Activity:  Normal  Concentration:  Good  Recall:  Good  Fund of Knowledge: Good  Language: Good  Akathisia:  No  Handed:  Right  AIMS (if indicated):    Assets:  Desire for Improvement  ADL's:  Intact  Cognition: WNL  Sleep:       Treatment Plan Summary:At this time the patient is doing fairly well. We had a long discussion of his medications. I shared with him that I would rather not be on imipramine for long-term basis given its anticholinergic effects area on the other hand he denies  sedation constipation or blurred vision. For now we'll continue it but we will make some changes. We will start him back on Effexor at a lower dose of 75 mg XR and changes Wellbutrin from 100 mg slow release to dose of 150 mg XL. He'll take Effexor and Wellbutrin in the morning. He'll continue taking Xanax 0.5 mg at night which helps him sleep. For now he'll continue low-dose imipramine 10 mg at night. At the incident significant dose and probably is no reason to make any changes with it. Today the patient did not come in with his wife. Is getting along well with her and seems to be a nonissue. The patient is doing well. He denies any chest pain these days. He has no shortness of breath. He is in pretty good shape. He denies any neurological symptoms.   Haskel Schroeder, MD 11/06/2016, 9:12 AM [facility] MD Progress Note  11/06/2016 9:12 AM Benjamin Hahn  MRN:  [id number] Subjective:  Feels well Principal Problem: Major depression, chronic, mild/residual Diagnosis:  Major depression, recurrent residual Today the patient is doing fairly well. He's been having some medical problems. He recently went for Thanksgiving to a length but unfortunately had a urinary tract infection. He also since then has been coughing and recently started on antibiotic. Emotionally he is fairly stable. He denies daily depression. He stays very active at work. He's been unable to exercise. The patient is sleeping well and has an increase in his appetite. His energy is good. Is no problems thinking or concentrating. He is recently having a productive cough but is no shortness of breath. He denies any chest pain. Is a stable relationship with his wife. His kidneys are good. He has 4 grandchildren. Everybody in his home is good. He only issue is that a number mornings he awakens and he feels oversedated. It occurs point  of his wife feels uncomfortable with him driving will taken to work. Today reviewed his medications and is  evident that he likely needs reduction. He also notes that his memory has change in his and is good. 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 Patient Active Problem List   Diagnosis Date Noted  . Acute coronary syndrome ([place]) [I24.9] 08/30/2015  . Temporal arteritis ([place]) [M31.6] 06/16/2013  . Amaurosis fugax [G45.3] 06/01/2013  . Unspecified hypothyroidism [E03.9] 03/14/2013  . Other and unspecified hyperlipidemia [E78.5] 03/14/2013  . BPH (benign prostatic hypertrophy) [N40.0] 03/14/2013  . Hypogonadism, male [E29.1] 03/14/2013  . Erectile dysfunction [N52.9] 03/14/2013   Total Time spent with patient: 30 minutes  Past Psychiatric History:   Past Medical History:  Past Medical History:  Diagnosis Date  . Anxiety   . Arthritis    might be in back, no problems  . BPH (benign prostatic hyperplasia)   . Depression 1990s  . GERD (gastroesophageal reflux disease)    occasional  . Hypercholesteremia    controled  . Hypothyroid   . Shingles May 2014   "Right face, still has some"  . Temporal arteritis ([place])   . Transient blindness of both eyes     Past Surgical History:  Procedure Laterality Date  . ARTERY BIOPSY Right 06/20/2013   Procedure: BIOPSY TEMPORAL ARTERY RIGHT;  Surgeon: Earnstine Regal, MD;  Location: [facility];  Service: General;  Laterality: Right;  . CARDIAC CATHETERIZATION N/A 09/02/2015   Procedure: Left Heart Cath and Coronary Angiography;  Surgeon: Charolette Forward, MD;  Location: [place] [facility];  Service: Cardiovascular;  Laterality: N/A;  . CARDIAC CATHETERIZATION N/A 09/02/2015   Procedure: Coronary Stent Intervention;  Surgeon: Charolette Forward, MD;  Location: [place] [facility];  Service: Cardiovascular;  Laterality: N/A;  1.  mid RCA      (3.0/28mm Xience) 2.  Mid LAD      (3.0/23mm Xience)  . NO PAST SURGERIES    . None     Family History:  Family History  Problem Relation Age of Onset  . Stroke Father      Died, 80s  . Stroke Sister     Living, 70  . Heart disease Mother     Died, 66  . Healthy Daughter   . Diabetes Maternal Grandmother   . Hypertension Neg Hx    Family Psychiatric  History:  Social History:  History  Alcohol Use  . 1.2 oz/week  . 2 Shots of liquor per week    Comment: socially     History  Drug Use No    Social History   Social History  . Marital status: Married    Spouse name: N/A  . Number of children: N/A  . Years of education: N/A   Social History Main Topics  . Smoking status: Former Smoker    Types: Cigarettes    Quit date: 08/24/1966  . Smokeless tobacco: Never Used  . Alcohol use 1.2 oz/week    2 Shots of liquor per week     Comment: socially  . Drug use: No  . Sexual activity: Not Asked   Other Topics Concern  . None   Social History Narrative   Currently works as a Midwife.  Lives with wife and they have two healthy daughters.   Additional Social History:  Sleep: Good  Appetite:  Good  Current Medications: Current Outpatient Prescriptions  Medication Sig Dispense Refill  . acetaminophen (TYLENOL) 500 MG tablet Take 500 mg by mouth daily as needed (pain).    [name] ALPRAZolam (XANAX) 0.5 MG tablet 1  qhs 1  q day PRN 45 tablet 5  . aspirin 81 MG chewable tablet Chew 1 tablet (81 mg total) by mouth daily. 30 tablet 3  . atorvastatin (LIPITOR) 40 MG tablet Take 1 tablet (40 mg total) by mouth daily at 6 PM. 30 tablet 3  . azithromycin (ZITHROMAX Z-PAK) 250 MG tablet 2 tabs today then 1 daily till gone (Patient not taking: Reported on 11/04/2016) 6 each 0  . buPROPion (WELLBUTRIN XL) 150 MG 24 hr tablet Take 1 tablet (150 mg total) by mouth every morning. 30 tablet 5  . chlorpheniramine-HYDROcodone (TUSSIONEX PENNKINETIC ER) 10-8 MG/5ML SUER Take 5 mLs by mouth every 12 (twelve) hours as needed for cough. (Patient not taking: Reported on 11/04/2016) 115 mL 0  . clobetasol cream (TEMOVATE) 0.05 % APPLY AS  DIRECTED. 30 g 0  . clopidogrel (PLAVIX) 75 MG tablet Take 1 tablet (75 mg total) by mouth daily with breakfast. 30 tablet 3  . imipramine (TOFRANIL) 10 MG tablet Take 1 tablet (10 mg total) by mouth at bedtime. 30 tablet 4  . levofloxacin (LEVAQUIN) 500 MG tablet Take 1 tablet (500 mg total) by mouth daily. (Patient not taking: Reported on 11/04/2016) 7 tablet 0  . levothyroxine (SYNTHROID, LEVOTHROID) 88 MCG tablet TAKE 1 TABLET ONCE DAILY. 30 tablet 3  . metoprolol succinate (TOPROL-XL) 25 MG 24 hr tablet TAKE 1 TABLET ONCE DAILY. (Patient taking differently: TAKE 1/2 TABLET ONCE DAILY.) 30 tablet 3  . nitroGLYCERIN (NITROSTAT) 0.4 MG SL tablet Place 1 tablet (0.4 mg total) under the tongue every 5 (five) minutes as needed for chest pain (CP or SOB). 25 tablet 12  . predniSONE (DELTASONE) 10 MG tablet Take 2 tablets today- than take 1 tablet daily for 4 days (Patient not taking: Reported on 11/04/2016) 6 tablet 0  . ramipril (ALTACE) 1.25 MG capsule Take 1 capsule (1.25 mg total) by mouth daily. 30 capsule 3  . tamsulosin (FLOMAX) 0.4 MG CAPS capsule TAKE (1) CAPSULE DAILY. (Patient taking differently: TAKE (2) CAPSULES  DAILY AFTER SUPPER) 30 capsule 3  . venlafaxine XR (EFFEXOR XR) 75 MG 24 hr capsule Take 2 capsules (150 mg total) by mouth daily. 30 capsule 5   No current facility-administered medications for this visit.     Lab Results:  No results found for this or any previous visit (from the past 48 hour(s)).  Physical Findings: AIMS:  , ,  ,  ,    CIWA:    COWS:     Musculoskeletal: Strength & Muscle Tone: within normal limits Gait & Station: normal Patient leans: Right  Psychiatric Specialty Exam: ROS  Blood pressure 122/76, pulse 81, height 5\' 8"  (1.727 m), weight 193 lb 6.4 oz (87.7 kg).Body mass index is 29.41 kg/m.  General Appearance: Negative  Eye Contact::  Good  Speech:  Clear and Coherent  Volume:  Normal  Mood:  NA  Affect:  Appropriate  Thought Process:   Coherent  Orientation:  Full (Time, Place, and Person)  Thought Content:  WDL  Suicidal Thoughts:  No  Homicidal Thoughts:  No  Memory:  NA  Judgement:  Good  Insight:  Good  Psychomotor Activity:  Normal  Concentration:  Good  Recall:  Good  Fund of Knowledge:Good  Language: Good  Akathisia:  No  Handed:  Right  AIMS (if indicated):     Assets:  Desire for Improvement  ADL's:  Intact  Cognition: WNL  Sleep:      Treatment Plan Summary:At this time the patient will continue taking imipramine 10 mg at night and Xanax 0.5 mg. One of his problems with sleep or these medications she does well. Is not on it major problem is that of clinical depression. Again he has problems with compliance. She's taking [place] 150 mg XL all the complaints about extensive. He does state. For reasons that are not clear he stopped his Effexor. Today he is agreed to restart his Effexor 75 mg XL each morning. Taking Effexor with [place] morning and he takes Xanax and imipramine night. He'll return to see me in 3 months for an opportunity to have some supportive psychotherapy. Presently the patient is not in therapy. I do not think this patient is suicidal at this time. Physically he is very stable. [facility] MD Progress Note  11/06/2016 9:12 AM Benjamin Hahn  MRN:  [id number] Subjective:  Feels well Principal Problem: Major depression, chronic, mild/residual Diagnosis:  Major depression, recurrent residual Today the patient is doing fairly well. He's been having some medical problems. He recently went for Thanksgiving to a length but unfortunately had a urinary tract infection. He also since then has been coughing and recently started on antibiotic. Emotionally he is fairly stable. He denies daily depression. He stays very active at work. He's been unable to exercise. The patient is sleeping well and has an increase in his appetite. His energy is good. Is no problems thinking or concentrating. He is recently  having a productive cough but is no shortness of breath. He denies any chest pain. Is a stable relationship with his wife. His kidneys are good. He has 4 grandchildren. Everybody in his home is good. He only issue is that a number mornings he awakens and he feels oversedated. It occurs point of his wife feels uncomfortable with him driving will taken to work. Today reviewed his medications and is evident that he likely needs reduction. He also notes that his memory has change in his and is good. 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 Patient Active Problem List   Diagnosis Date Noted  . Acute coronary syndrome ([place]) [I24.9] 08/30/2015  . Temporal arteritis ([place]) [M31.6] 06/16/2013  . Amaurosis fugax [G45.3] 06/01/2013  . Unspecified hypothyroidism [E03.9] 03/14/2013  . Other and unspecified hyperlipidemia [E78.5] 03/14/2013  . BPH (benign prostatic hypertrophy) [N40.0] 03/14/2013  . Hypogonadism, male [E29.1] 03/14/2013  . Erectile dysfunction [N52.9] 03/14/2013   Total Time spent with patient: 30 minutes  Past Psychiatric History:   Past Medical History:  Past Medical History:  Diagnosis Date  . Anxiety   . Arthritis    might be in back, no problems  . BPH (benign prostatic hyperplasia)   . Depression 1990s  . GERD (gastroesophageal reflux disease)    occasional  . Hypercholesteremia    controled  . Hypothyroid   . Shingles May 2014   "Right face, still has some"  . Temporal arteritis ([place])   . Transient blindness of both eyes     Past Surgical History:  Procedure Laterality Date  . ARTERY BIOPSY Right 06/20/2013   Procedure: BIOPSY TEMPORAL ARTERY RIGHT;  Surgeon: Earnstine Regal, MD;  [facility]: [facility];  Service: General;  Laterality: Right;  . CARDIAC CATHETERIZATION N/A 09/02/2015   Procedure: Left Heart Cath and Coronary Angiography;  Surgeon: Charolette Forward, MD;  Location: [place] [facility];  Service: Cardiovascular;   Laterality: N/A;  . CARDIAC CATHETERIZATION N/A 09/02/2015   Procedure: Coronary Stent Intervention;  Surgeon: Charolette Forward, MD;  Location: [place] [facility];  Service: Cardiovascular;  Laterality: N/A;  1.  mid RCA      (3.0/28mm Xience) 2.  Mid LAD      (3.0/23mm Xience)  . NO PAST SURGERIES    . None     Family History:  Family History  Problem Relation Age of Onset  . Stroke Father     Died, 80s  . Stroke Sister     Living, 42  . Heart disease Mother     Died, 82  . Healthy Daughter   . Diabetes Maternal Grandmother   . Hypertension Neg Hx    Family Psychiatric  History:  Social History:  History  Alcohol Use  . 1.2 oz/week  . 2 Shots of liquor per week    Comment: socially     History  Drug Use No    Social History   Social History  . Marital status: Married    Spouse name: N/A  . Number of children: N/A  . Years of education: N/A   Social History Main Topics  . Smoking status: Former Smoker    Types: Cigarettes    Quit date: 08/24/1966  . Smokeless tobacco: Never Used  . Alcohol use 1.2 oz/week    2 Shots of liquor per week     Comment: socially  . Drug use: No  . Sexual activity: Not Asked   Other Topics Concern  . None   Social History Narrative   Currently works as a Midwife.  Lives with wife and they have two healthy daughters.   Additional Social History:                         Sleep: Good  Appetite:  Good  Current Medications: Current Outpatient Prescriptions  Medication Sig Dispense Refill  . acetaminophen (TYLENOL) 500 MG tablet Take 500 mg by mouth daily as needed (pain).    [name] ALPRAZolam (XANAX) 0.5 MG tablet 1  qhs 1  q day PRN 45 tablet 5  . aspirin 81 MG chewable tablet Chew 1 tablet (81 mg total) by mouth daily. 30 tablet 3  . atorvastatin (LIPITOR) 40 MG tablet Take 1 tablet (40 mg total) by mouth daily at 6 PM. 30 tablet 3  . azithromycin (ZITHROMAX Z-PAK) 250 MG tablet 2 tabs today then 1 daily till gone  (Patient not taking: Reported on 11/04/2016) 6 each 0  . buPROPion (WELLBUTRIN XL) 150 MG 24 hr tablet Take 1 tablet (150 mg total) by mouth every morning. 30 tablet 5  . chlorpheniramine-HYDROcodone (TUSSIONEX PENNKINETIC ER) 10-8 MG/5ML SUER Take 5 mLs by mouth every 12 (twelve) hours as needed for cough. (Patient not taking: Reported on 11/04/2016) 115 mL 0  . clobetasol cream (TEMOVATE) 0.05 % APPLY AS DIRECTED. 30 g 0  . clopidogrel (PLAVIX) 75 MG tablet Take 1 tablet (75 mg total) by mouth daily with breakfast. 30 tablet 3  . imipramine (TOFRANIL) 10 MG tablet Take 1 tablet (10 mg total) by mouth at bedtime. 30 tablet 4  . levofloxacin (LEVAQUIN) 500 MG tablet Take 1 tablet (500 mg total) by mouth daily. (Patient not taking:  Reported on 11/04/2016) 7 tablet 0  . levothyroxine (SYNTHROID, LEVOTHROID) 88 MCG tablet TAKE 1 TABLET ONCE DAILY. 30 tablet 3  . metoprolol succinate (TOPROL-XL) 25 MG 24 hr tablet TAKE 1 TABLET ONCE DAILY. (Patient taking differently: TAKE 1/2 TABLET ONCE DAILY.) 30 tablet 3  . nitroGLYCERIN (NITROSTAT) 0.4 MG SL tablet Place 1 tablet (0.4 mg total) under the tongue every 5 (five) minutes as needed for chest pain (CP or SOB). 25 tablet 12  . predniSONE (DELTASONE) 10 MG tablet Take 2 tablets today- than take 1 tablet daily for 4 days (Patient not taking: Reported on 11/04/2016) 6 tablet 0  . ramipril (ALTACE) 1.25 MG capsule Take 1 capsule (1.25 mg total) by mouth daily. 30 capsule 3  . tamsulosin (FLOMAX) 0.4 MG CAPS capsule TAKE (1) CAPSULE DAILY. (Patient taking differently: TAKE (2) CAPSULES  DAILY AFTER SUPPER) 30 capsule 3  . venlafaxine XR (EFFEXOR XR) 75 MG 24 hr capsule Take 2 capsules (150 mg total) by mouth daily. 30 capsule 5   No current facility-administered medications for this visit.     Lab Results:  No results found for this or any previous visit (from the past 48 hour(s)).  Physical Findings: AIMS:  , ,  ,  ,    CIWA:    COWS:      Musculoskeletal: Strength & Muscle Tone: within normal limits Gait & Station: normal Patient leans: Right  Psychiatric Specialty Exam: ROS  Blood pressure 122/76, pulse 81, height 5\' 8"  (1.727 m), weight 193 lb 6.4 oz (87.7 kg).Body mass index is 29.41 kg/m.  General Appearance: Negative  Eye Contact::  Good  Speech:  Clear and Coherent  Volume:  Normal  Mood:  NA  Affect:  Appropriate  Thought Process:  Coherent  Orientation:  Full (Time, Place, and Person)  Thought Content:  WDL  Suicidal Thoughts:  No  Homicidal Thoughts:  No  Memory:  NA  Judgement:  Good  Insight:  Good  Psychomotor Activity:  Normal  Concentration:  Good  Recall:  Good  Fund of Knowledge:Good  Language: Good  Akathisia:  No  Handed:  Right  AIMS (if indicated):     Assets:  Desire for Improvement  ADL's:  Intact  Cognition: WNL  Sleep:      Treatment Plan Summary:  At this time the patient is doing great. He'll continue taking Wellbutrin 150 mg XL in the morning. He doesn't take Effexor anymore. I shared within the last medicine that better. He does take a small dose of Xanax 0.5 mg half at night together with 5 mg of melatonin which helps him sleep. He also takes only 10 mg of the never been which also helps him. His #1 problem therefore is clinical depression which is much resolved at this time. The patient is sleeping well on the present medications. His second problem is erectile dysfunction which is getting testosterone replacement from his primary care doctor is third problem is a history of an MI. At this time is well-controlled in terms of chest pain and shortness of breath. Is no physical complaints at all. This patient was asked to return in 5 months with his wife will get her perspective. The patient denies any neurological symptoms. He certainly is not suicidal. He is functioning extremely well. This particular true an 83 year old gentleman. 11/06/2016, 9:12 AM

## 2016-11-12 DIAGNOSIS — E291 Testicular hypofunction: Secondary | ICD-10-CM | POA: Diagnosis not present

## 2016-11-26 ENCOUNTER — Other Ambulatory Visit: Payer: Self-pay | Admitting: Endocrinology

## 2016-12-10 DIAGNOSIS — E291 Testicular hypofunction: Secondary | ICD-10-CM | POA: Diagnosis not present

## 2016-12-11 ENCOUNTER — Ambulatory Visit (INDEPENDENT_AMBULATORY_CARE_PROVIDER_SITE_OTHER): Payer: Medicare Other | Admitting: Endocrinology

## 2016-12-11 VITALS — BP 128/78 | HR 60 | Ht 68.0 in | Wt 188.8 lb

## 2016-12-11 DIAGNOSIS — J45901 Unspecified asthma with (acute) exacerbation: Secondary | ICD-10-CM | POA: Diagnosis not present

## 2016-12-11 MED ORDER — PREDNISONE 10 MG PO TABS
ORAL_TABLET | ORAL | 0 refills | Status: DC
Start: 2016-12-11 — End: 2017-03-05

## 2016-12-11 MED ORDER — ALBUTEROL SULFATE HFA 108 (90 BASE) MCG/ACT IN AERS: 2.0000 | INHALATION_SPRAY | Freq: Four times a day (QID) | RESPIRATORY_TRACT | 0 refills | Status: DC | PRN

## 2016-12-11 NOTE — Patient Instructions (Addendum)
OTC Mucinex DM 2x daily More fluids, humidifier

## 2016-12-11 NOTE — Progress Notes (Signed)
Subjective:     Patient ID: Benjamin Hahn, male   DOB: Jan 17, 1933, 83 y.o.   MRN: [id number]  Cough  This is a new problem. The current episode started in the past 7 days. The problem has been gradually worsening. The cough is non-productive. Associated symptoms include a sore throat, shortness of breath and wheezing. Pertinent negatives include no chest pain or nasal congestion. Nothing aggravates the symptoms. He has tried OTC cough suppressant for the symptoms. The treatment provided mild relief. His past medical history is significant for asthma.     Review of Systems  HENT: Positive for sore throat.   Respiratory: Positive for cough, shortness of breath and wheezing.   Cardiovascular: Negative for chest pain.       Objective:   Physical Exam  Constitutional: No distress.  HENT:  Mouth/Throat: Oropharynx is clear and moist. No oropharyngeal exudate.  Pulmonary/Chest: Effort normal. He has wheezes. He has no rales.  Lymphadenopathy:    He has no cervical adenopathy.       Assessment:     Probably viral bronchitis with asthmatic component He is afebrile and his not producing any parents.  I'm currently Lungs are showing mostly bronchospasm     Plan:     He will get another course of prednisone and start albuterol as well as Mucinex DM for treatment

## 2016-12-17 ENCOUNTER — Encounter: Payer: Self-pay | Admitting: Endocrinology

## 2016-12-17 ENCOUNTER — Ambulatory Visit (INDEPENDENT_AMBULATORY_CARE_PROVIDER_SITE_OTHER): Payer: Medicare Other | Admitting: Endocrinology

## 2016-12-17 VITALS — BP 122/62 | HR 64 | Temp 98.1°F | Ht 67.5 in | Wt 189.0 lb

## 2016-12-17 DIAGNOSIS — J0110 Acute frontal sinusitis, unspecified: Secondary | ICD-10-CM | POA: Diagnosis not present

## 2016-12-17 MED ORDER — AZITHROMYCIN 250 MG PO TABS: ORAL_TABLET | ORAL | 0 refills | Status: DC

## 2016-12-17 NOTE — Patient Instructions (Signed)
Saline nasal spray 4x daily Tylenol  Stay on Mucinex

## 2016-12-17 NOTE — Progress Notes (Signed)
Subjective:     Patient ID: Benjamin Hahn, male   DOB: Mar 04, 1933, 83 y.o.   MRN: [id number]  Cough  The problem has been gradually improving. Associated symptoms include headaches and a sore throat. Pertinent negatives include no chest pain, fever, nasal congestion, postnasal drip, shortness of breath or wheezing. The treatment provided moderate relief. His past medical history is significant for asthma.   He is coming in today for follow-up but he is complaining now about significant head congestion and headaches which is mostly over the eyes No nasal drainage or secretions, no nasal blockage He has not been using Mucinex or any Tylenol  Review of Systems  Constitutional: Negative for fever.  HENT: Positive for sore throat. Negative for postnasal drip.   Respiratory: Positive for cough. Negative for shortness of breath and wheezing.   Cardiovascular: Negative for chest pain.  Neurological: Positive for headaches.       Objective:   Physical Exam  Constitutional: No distress.  HENT:  Mouth/Throat: Oropharynx is clear and moist. No oropharyngeal exudate.  Nasal passages did not show any purulence, mild erythema. He has tenderness of the right maxillary sinus present, minimal frontal sinus tenderness  Pulmonary/Chest: Effort normal. He has no wheezes. He has no rales.       Assessment:      Probable maxillary sinus infection with sinus headaches     Plan:        OTC nasal saline spray 4 times a day Restart Mucinex Zithromax for 5 days

## 2016-12-24 ENCOUNTER — Other Ambulatory Visit: Payer: Self-pay | Admitting: Endocrinology

## 2016-12-24 DIAGNOSIS — E291 Testicular hypofunction: Secondary | ICD-10-CM | POA: Diagnosis not present

## 2016-12-25 DIAGNOSIS — J45909 Unspecified asthma, uncomplicated: Secondary | ICD-10-CM | POA: Diagnosis not present

## 2016-12-25 DIAGNOSIS — M1990 Unspecified osteoarthritis, unspecified site: Secondary | ICD-10-CM | POA: Diagnosis not present

## 2016-12-25 DIAGNOSIS — I25118 Atherosclerotic heart disease of native coronary artery with other forms of angina pectoris: Secondary | ICD-10-CM | POA: Diagnosis not present

## 2016-12-25 DIAGNOSIS — I10 Essential (primary) hypertension: Secondary | ICD-10-CM | POA: Diagnosis not present

## 2016-12-25 DIAGNOSIS — E039 Hypothyroidism, unspecified: Secondary | ICD-10-CM | POA: Diagnosis not present

## 2016-12-25 DIAGNOSIS — E785 Hyperlipidemia, unspecified: Secondary | ICD-10-CM | POA: Diagnosis not present

## 2017-01-07 DIAGNOSIS — E291 Testicular hypofunction: Secondary | ICD-10-CM | POA: Diagnosis not present

## 2017-01-21 DIAGNOSIS — E291 Testicular hypofunction: Secondary | ICD-10-CM | POA: Diagnosis not present

## 2017-01-27 ENCOUNTER — Other Ambulatory Visit: Payer: Self-pay | Admitting: Endocrinology

## 2017-02-04 DIAGNOSIS — E291 Testicular hypofunction: Secondary | ICD-10-CM | POA: Diagnosis not present

## 2017-02-08 ENCOUNTER — Other Ambulatory Visit: Payer: Self-pay | Admitting: Endocrinology

## 2017-02-12 ENCOUNTER — Ambulatory Visit (HOSPITAL_COMMUNITY): Payer: Self-pay | Admitting: Psychiatry

## 2017-02-18 DIAGNOSIS — E291 Testicular hypofunction: Secondary | ICD-10-CM | POA: Diagnosis not present

## 2017-03-02 ENCOUNTER — Other Ambulatory Visit: Payer: Self-pay | Admitting: Endocrinology

## 2017-03-02 ENCOUNTER — Other Ambulatory Visit: Payer: Medicare Other

## 2017-03-02 DIAGNOSIS — E291 Testicular hypofunction: Secondary | ICD-10-CM

## 2017-03-02 DIAGNOSIS — E063 Autoimmune thyroiditis: Secondary | ICD-10-CM

## 2017-03-02 DIAGNOSIS — E559 Vitamin D deficiency, unspecified: Secondary | ICD-10-CM

## 2017-03-02 DIAGNOSIS — E7800 Pure hypercholesterolemia, unspecified: Secondary | ICD-10-CM

## 2017-03-03 ENCOUNTER — Other Ambulatory Visit (INDEPENDENT_AMBULATORY_CARE_PROVIDER_SITE_OTHER): Payer: Medicare Other

## 2017-03-03 DIAGNOSIS — E7800 Pure hypercholesterolemia, unspecified: Secondary | ICD-10-CM

## 2017-03-03 DIAGNOSIS — E291 Testicular hypofunction: Secondary | ICD-10-CM

## 2017-03-03 DIAGNOSIS — E559 Vitamin D deficiency, unspecified: Secondary | ICD-10-CM | POA: Diagnosis not present

## 2017-03-03 DIAGNOSIS — E063 Autoimmune thyroiditis: Secondary | ICD-10-CM

## 2017-03-03 LAB — COMPREHENSIVE METABOLIC PANEL
ALT: 10 U/L (ref 0–53)
AST: 11 U/L (ref 0–37)
Albumin: 3.9 g/dL (ref 3.5–5.2)
Alkaline Phosphatase: 46 U/L (ref 39–117)
BUN: 16 mg/dL (ref 6–23)
CHLORIDE: 103 meq/L (ref 96–112)
CO2: 31 mEq/L (ref 19–32)
Calcium: 8.9 mg/dL (ref 8.4–10.5)
Creatinine, Ser: 1.48 mg/dL (ref 0.40–1.50)
GFR: 48.35 mL/min — AB (ref >60.00)
GLUCOSE: 102 mg/dL — AB (ref 70–99)
POTASSIUM: 4.3 meq/L (ref 3.5–5.1)
Sodium: 139 mEq/L (ref 135–145)
Total Bilirubin: 0.7 mg/dL (ref 0.2–1.2)
Total Protein: 6.5 g/dL (ref 6.0–8.3)

## 2017-03-03 LAB — LIPID PANEL
CHOL/HDL RATIO: 2
Cholesterol: 121 mg/dL (ref 0–200)
HDL: 53.5 mg/dL (ref >39.00)
LDL Cholesterol: 57 mg/dL (ref 0–99)
NONHDL: 67.42
TRIGLYCERIDES: 51 mg/dL (ref 0.0–149.0)
VLDL: 10.2 mg/dL (ref 0.0–40.0)

## 2017-03-03 LAB — TESTOSTERONE: Testosterone: 361.35 ng/dL (ref 300.00–890.00)

## 2017-03-03 LAB — T4, FREE: Free T4: 0.91 ng/dL (ref 0.60–1.60)

## 2017-03-03 LAB — TSH: TSH: 5.73 u[IU]/mL — ABNORMAL HIGH (ref 0.35–4.50)

## 2017-03-03 LAB — VITAMIN D 25 HYDROXY (VIT D DEFICIENCY, FRACTURES): VITD: 42.84 ng/mL (ref 30.00–100.00)

## 2017-03-04 DIAGNOSIS — E291 Testicular hypofunction: Secondary | ICD-10-CM | POA: Diagnosis not present

## 2017-03-04 NOTE — Progress Notes (Signed)
Subjective:      Patient ID: Benjamin Hahn, male   DOB: Feb 11, 1933, 83 y.o.   MRN: [id number]  Chief complaint:Follow-upOf various problems  HPI      HYPOTHYROIDISM: This is long-standing and he is usually on a stable dose since 2014 Currently taking 88 mcg.  He feels fairly good overall with his energy level and is taking levothyroxine every morning consistently Last lab results are as follows:  Lab Results  Component Value Date   FREET4 0.91 03/03/2017   FREET4 0.96 11/04/2016   FREET4 0.87 03/02/2016   TSH 5.73 (H) 03/03/2017   TSH 2.91 11/04/2016   TSH 1.81 03/02/2016    ?  PREDIABETES  His fasting glucose is 102, no A1c available recently although it had been 6.1 previously in 2014 He has a grandmother with diabetes His weight appears to be stable  He said that he is not really watching his diet and may be eating more sweets and carbohydrates at times He is trying to do some walking and is going to the gym only once or twice a week, he thinks he is too busy recently to exercise  Wt Readings from Last 3 Encounters:  03/05/17 189 lb (85.7 kg)  12/17/16 189 lb (85.7 kg)  12/11/16 188 lb 12.8 oz (85.6 kg)   Lab Results  Component Value Date   HGBA1C 6.1 06/01/2013   Lab Results  Component Value Date   LDLCALC 57 03/03/2017   CREATININE 1.48 03/03/2017     DEPRESSION: Treated by psychiatrist. Well controlled with Wellbutrin, now taking 150 mg; still taking imipramine 10 mg at night to help sleep Has been previously also on Effexor but he has not been taking this recently  Needs occasional Xanax for anxiety and is getting adequate relief when he needs this  HYPOGONADISM: See review of systems  LIPIDS: See review of systems  Allergies as of 03/05/2017   No Known Allergies     Medication List       Accurate as of 03/05/17  8:32 AM. Always use your most recent med list.          acetaminophen 500 MG tablet Commonly known as:  TYLENOL Take 500  mg by mouth daily as needed (pain).   albuterol 108 (90 Base) MCG/ACT inhaler Commonly known as:  PROVENTIL HFA;VENTOLIN HFA Inhale 2 puffs into the lungs every 6 (six) hours as needed for wheezing or shortness of breath.   ALPRAZolam 0.5 MG tablet Commonly known as:  XANAX 1  qhs 1  q day PRN   aspirin 81 MG chewable tablet Chew 1 tablet (81 mg total) by mouth daily.   atorvastatin 40 MG tablet Commonly known as:  LIPITOR Take 1 tablet (40 mg total) by mouth daily at 6 PM.   buPROPion 150 MG 24 hr tablet Commonly known as:  WELLBUTRIN XL Take 1 tablet (150 mg total) by mouth every morning.   clopidogrel 75 MG tablet Commonly known as:  PLAVIX Take 1 tablet (75 mg total) by mouth daily with breakfast.   imipramine 10 MG tablet Commonly known as:  TOFRANIL Take 1 tablet (10 mg total) by mouth at bedtime.   levothyroxine 88 MCG tablet Commonly known as:  SYNTHROID, LEVOTHROID TAKE 1 TABLET DAILY.   metoprolol succinate 25 MG 24 hr tablet Commonly known as:  TOPROL-XL TAKE (1/2) TABLET DAILY.   nitroGLYCERIN 0.4 MG SL tablet Commonly known as:  NITROSTAT Place 1 tablet (0.4 mg total) under the tongue  every 5 (five) minutes as needed for chest pain (CP or SOB).   ramipril 1.25 MG capsule Commonly known as:  ALTACE Take 1 capsule (1.25 mg total) by mouth daily.   tamsulosin 0.4 MG Caps capsule Commonly known as:  FLOMAX TAKE (1) CAPSULE DAILY.   venlafaxine XR 75 MG 24 hr capsule Commonly known as:  EFFEXOR XR Take 2 capsules (150 mg total) by mouth daily.       Review of Systems    Long-standing hypogonadism: He has been treated by his urologist.  From his urologist he has been treated withTestosterone injections, unknown dose He did have adequate levels on the Testopel which was expensive  He still feels fairly good with his energy level and his level was adequate the day before he had his injection     Lab Results  Component Value Date   TESTOSTERONE  361.35 03/03/2017     Hypercholesterolemia: This has been long-standing Since his MI he has been on 40 mg Lipitor, previously on 10 mg LDL is below 70 Consistently   Lab Results  Component Value Date   CHOL 121 03/03/2017   HDL 53.50 03/03/2017   LDLCALC 57 03/03/2017   TRIG 51.0 03/03/2017   CHOLHDL 2 03/03/2017      BPH: He still takes 0.8 mg of Flomax from his urologist with fairly good control of nocturia and frequency       Objective:   Physical Exam BP 108/66   Pulse 69   Ht 5' 7.5" (1.715 m)   Wt 189 lb (85.7 kg)   SpO2 96%   BMI 29.16 kg/m      Assessment:      Hypogonadism: Adequately treated with injections done by urologist and his last level is normal, no unusual fatigue.  Discussed that he needs to talk to his urologist regarding his continued problems with ED   ?  Impaired fasting glucose: His blood sugar has normally been below 100 but is now 102.  He does have family history of diabetes and remotely had an A1c of 6.1.  Discussed implications and need for change of lifestyle   Hypothyroidism: Adequately replaced with 88 g of levothyroxine   Depression, anxiety and insomnia: Well controlled, he is only on Wellbutrin for depression now, continue follow-up with psychiatrist  Hypercholesterolemia on treatment: Adequately controlled with 40 mg      Plan:      No change in medications  More regular exercise, He can go to the gym  He will start to modify his diet with cutting back on simple sugars and carbohydrates, will also recheck A1c on his next visit  Follow-up in 4 months  Borderline renal function: This may be related to low normal blood pressure and use of ramipril but he does need to continue ramipril for cardiac benefits, to forward labs to cardiologist   [facility]

## 2017-03-05 ENCOUNTER — Ambulatory Visit (INDEPENDENT_AMBULATORY_CARE_PROVIDER_SITE_OTHER): Payer: Medicare Other | Admitting: Endocrinology

## 2017-03-05 ENCOUNTER — Encounter: Payer: Self-pay | Admitting: Endocrinology

## 2017-03-05 VITALS — BP 108/66 | HR 69 | Ht 67.5 in | Wt 189.0 lb

## 2017-03-05 DIAGNOSIS — E7800 Pure hypercholesterolemia, unspecified: Secondary | ICD-10-CM

## 2017-03-05 DIAGNOSIS — R7301 Impaired fasting glucose: Secondary | ICD-10-CM

## 2017-03-05 DIAGNOSIS — E063 Autoimmune thyroiditis: Secondary | ICD-10-CM | POA: Diagnosis not present

## 2017-03-05 NOTE — Patient Instructions (Signed)
Walk daily  Less carbs and sweets

## 2017-03-10 NOTE — Progress Notes (Signed)
Please call to let patient know that the lab results are normal except for the thyroid which is slightly low If he has been taking his thyroid pill daily before breakfast and may need to increase the dose to 100 g daily instead of 88, send prescription with 3 refills

## 2017-03-18 DIAGNOSIS — E291 Testicular hypofunction: Secondary | ICD-10-CM | POA: Diagnosis not present

## 2017-03-26 ENCOUNTER — Other Ambulatory Visit: Payer: Self-pay | Admitting: Endocrinology

## 2017-03-29 ENCOUNTER — Other Ambulatory Visit: Payer: Self-pay | Admitting: Endocrinology

## 2017-03-31 ENCOUNTER — Ambulatory Visit (HOSPITAL_COMMUNITY): Payer: Medicare Other | Admitting: Psychiatry

## 2017-04-06 DIAGNOSIS — E291 Testicular hypofunction: Secondary | ICD-10-CM | POA: Diagnosis not present

## 2017-04-07 DIAGNOSIS — E785 Hyperlipidemia, unspecified: Secondary | ICD-10-CM | POA: Diagnosis not present

## 2017-04-07 DIAGNOSIS — I25118 Atherosclerotic heart disease of native coronary artery with other forms of angina pectoris: Secondary | ICD-10-CM | POA: Diagnosis not present

## 2017-04-07 DIAGNOSIS — M1990 Unspecified osteoarthritis, unspecified site: Secondary | ICD-10-CM | POA: Diagnosis not present

## 2017-04-07 DIAGNOSIS — J45909 Unspecified asthma, uncomplicated: Secondary | ICD-10-CM | POA: Diagnosis not present

## 2017-04-07 DIAGNOSIS — E039 Hypothyroidism, unspecified: Secondary | ICD-10-CM | POA: Diagnosis not present

## 2017-04-07 DIAGNOSIS — I10 Essential (primary) hypertension: Secondary | ICD-10-CM | POA: Diagnosis not present

## 2017-04-15 DIAGNOSIS — E291 Testicular hypofunction: Secondary | ICD-10-CM | POA: Diagnosis not present

## 2017-04-20 ENCOUNTER — Other Ambulatory Visit (HOSPITAL_COMMUNITY): Payer: Self-pay

## 2017-04-20 MED ORDER — IMIPRAMINE HCL 10 MG PO TABS: 10.0000 mg | ORAL_TABLET | Freq: Every day | ORAL | 4 refills | Status: DC

## 2017-04-23 ENCOUNTER — Other Ambulatory Visit: Payer: Self-pay | Admitting: Endocrinology

## 2017-04-29 DIAGNOSIS — N5201 Erectile dysfunction due to arterial insufficiency: Secondary | ICD-10-CM | POA: Diagnosis not present

## 2017-04-29 DIAGNOSIS — E291 Testicular hypofunction: Secondary | ICD-10-CM | POA: Diagnosis not present

## 2017-05-13 DIAGNOSIS — E291 Testicular hypofunction: Secondary | ICD-10-CM | POA: Diagnosis not present

## 2017-05-27 ENCOUNTER — Other Ambulatory Visit: Payer: Self-pay | Admitting: Endocrinology

## 2017-05-27 DIAGNOSIS — E291 Testicular hypofunction: Secondary | ICD-10-CM | POA: Diagnosis not present

## 2017-06-08 ENCOUNTER — Ambulatory Visit (INDEPENDENT_AMBULATORY_CARE_PROVIDER_SITE_OTHER): Payer: Medicare Other

## 2017-06-08 DIAGNOSIS — Z23 Encounter for immunization: Secondary | ICD-10-CM | POA: Diagnosis not present

## 2017-06-09 DIAGNOSIS — R350 Frequency of micturition: Secondary | ICD-10-CM | POA: Diagnosis not present

## 2017-06-09 DIAGNOSIS — N401 Enlarged prostate with lower urinary tract symptoms: Secondary | ICD-10-CM | POA: Diagnosis not present

## 2017-06-11 DIAGNOSIS — E291 Testicular hypofunction: Secondary | ICD-10-CM | POA: Diagnosis not present

## 2017-06-24 DIAGNOSIS — E291 Testicular hypofunction: Secondary | ICD-10-CM | POA: Diagnosis not present

## 2017-06-30 ENCOUNTER — Ambulatory Visit (INDEPENDENT_AMBULATORY_CARE_PROVIDER_SITE_OTHER): Payer: Medicare Other | Admitting: Psychiatry

## 2017-06-30 ENCOUNTER — Other Ambulatory Visit (INDEPENDENT_AMBULATORY_CARE_PROVIDER_SITE_OTHER): Payer: Medicare Other

## 2017-06-30 VITALS — BP 98/62 | HR 79 | Ht 68.0 in | Wt 185.0 lb

## 2017-06-30 DIAGNOSIS — Z87891 Personal history of nicotine dependence: Secondary | ICD-10-CM

## 2017-06-30 DIAGNOSIS — F331 Major depressive disorder, recurrent, moderate: Secondary | ICD-10-CM | POA: Diagnosis not present

## 2017-06-30 DIAGNOSIS — R7301 Impaired fasting glucose: Secondary | ICD-10-CM | POA: Diagnosis not present

## 2017-06-30 DIAGNOSIS — Z8659 Personal history of other mental and behavioral disorders: Secondary | ICD-10-CM | POA: Diagnosis not present

## 2017-06-30 DIAGNOSIS — F1099 Alcohol use, unspecified with unspecified alcohol-induced disorder: Secondary | ICD-10-CM | POA: Diagnosis not present

## 2017-06-30 LAB — BASIC METABOLIC PANEL
BUN: 22 mg/dL (ref 6–23)
CHLORIDE: 103 meq/L (ref 96–112)
CO2: 29 mEq/L (ref 19–32)
Calcium: 9.1 mg/dL (ref 8.4–10.5)
Creatinine, Ser: 1.42 mg/dL (ref 0.40–1.50)
GFR: 50.68 mL/min — ABNORMAL LOW (ref >60.00)
Glucose, Bld: 84 mg/dL (ref 70–99)
POTASSIUM: 4.1 meq/L (ref 3.5–5.1)
SODIUM: 138 meq/L (ref 135–145)

## 2017-06-30 LAB — HEMOGLOBIN A1C: HEMOGLOBIN A1C: 5.7 % (ref 4.6–6.5)

## 2017-06-30 MED ORDER — ALPRAZOLAM 0.5 MG PO TABS: ORAL_TABLET | ORAL | 5 refills | Status: DC

## 2017-06-30 MED ORDER — IMIPRAMINE HCL 10 MG PO TABS: 10.0000 mg | ORAL_TABLET | Freq: Every day | ORAL | 5 refills | Status: DC

## 2017-06-30 MED ORDER — BUPROPION HCL ER (XL) 150 MG PO TB24: 150.0000 mg | ORAL_TABLET | ORAL | 6 refills | Status: DC

## 2017-06-30 NOTE — Progress Notes (Signed)
Patient ID: Benjamin Hahn, male   DOB: 18-May-1933, 83 y.o.   MRN: [id number] [facility] MD/PA/NP OP Progress Note  06/30/2017 4:45 PM JAH ALARID  MRN:  [id number]  Chief Complaint:  Subjective: Doing great Diagnosis : Major depression chronic, recurrent  Patient is doing very well. He still continues to work. He's lost weight. He denies any physical complaints at this time. Is no chest pain or shortness of breath. His mood is great. He is enjoying life. Is a good relationship with his wife. Is well. He is a good relationship with his wife. He's looking for to the holidays. He likes at home he lives in. He feels like he belongs. He denies anxiety. He denies the use of alcohol. He shows no evidence of psychosis. Is functioning very well. He works part of his business as an Forensic psychologist. The patient drinks no alcohol. He is very responsible medications. He did not really want to take Effexor but takes Wellbutrin as prescribed imipramine and a small dose of Xanax.  Past Medical History:  Diagnosis Date  . Anxiety   . Arthritis    might be in back, no problems  . BPH (benign prostatic hyperplasia)   . Depression 1990s  . GERD (gastroesophageal reflux disease)    occasional  . Hypercholesteremia    controled  . Hypothyroid   . Shingles May 2014   "Right face, still has some"  . Temporal arteritis ([place])   . Transient blindness of both eyes     Past Surgical History:  Procedure Laterality Date  . NO PAST SURGERIES    . None      Family Psychiatric History:   Family History:  Family History  Problem Relation Age of Onset  . Stroke Father        Died, 80s  . Stroke Sister        Living, 40  . Heart disease Mother        Died, 2  . Healthy Daughter   . Diabetes Maternal Grandmother   . Hypertension Neg Hx     Social History:  Social History   Socioeconomic History  . Marital status: Married    Spouse name: Not on file  . Number of children: Not on file  . Years of  education: Not on file  . Highest education level: Not on file  Social Needs  . Financial resource strain: Not on file  . Food insecurity - worry: Not on file  . Food insecurity - inability: Not on file  . Transportation needs - medical: Not on file  . Transportation needs - non-medical: Not on file  Occupational History  . Not on file  Tobacco Use  . Smoking status: Former Smoker    Types: Cigarettes    Last attempt to quit: 08/24/1966    Years since quitting: 50.8  . Smokeless tobacco: Never Used  Substance and Sexual Activity  . Alcohol use: Yes    Alcohol/week: 1.2 oz    Types: 2 Shots of liquor per week    Comment: socially  . Drug use: No  . Sexual activity: Not on file  Other Topics Concern  . Not on file  Social History Narrative   Currently works as a Midwife.  Lives with wife and they have two healthy daughters.    Allergies: No Known Allergies  Metabolic Disorder Labs: Lab Results  Component Value Date   HGBA1C 6.1 06/01/2013   No results found for: PROLACTIN Lab Results  Component Value Date   CHOL 121 03/03/2017   TRIG 51.0 03/03/2017   HDL 53.50 03/03/2017   CHOLHDL 2 03/03/2017   VLDL 10.2 03/03/2017   LDLCALC 57 03/03/2017   LDLCALC 66 07/02/2016     Current Medications: Current Outpatient Medications  Medication Sig Dispense Refill  . acetaminophen (TYLENOL) 500 MG tablet Take 500 mg by mouth daily as needed (pain).    [name] albuterol (PROVENTIL HFA;VENTOLIN HFA) 108 (90 Base) MCG/ACT inhaler Inhale 2 puffs into the lungs every 6 (six) hours as needed for wheezing or shortness of breath. 1 Inhaler 0  . ALPRAZolam (XANAX) 0.5 MG tablet 1  qhs 1  q day PRN 45 tablet 5  . aspirin 81 MG chewable tablet Chew 1 tablet (81 mg total) by mouth daily. 30 tablet 3  . atorvastatin (LIPITOR) 40 MG tablet Take 1 tablet (40 mg total) by mouth daily at 6 PM. 30 tablet 3  . buPROPion (WELLBUTRIN XL) 150 MG 24 hr tablet Take 1 tablet (150 mg total) every morning  by mouth. 30 tablet 6  . clopidogrel (PLAVIX) 75 MG tablet Take 1 tablet (75 mg total) by mouth daily with breakfast. 30 tablet 3  . imipramine (TOFRANIL) 10 MG tablet Take 1 tablet (10 mg total) at bedtime by mouth. 30 tablet 5  . levothyroxine (SYNTHROID, LEVOTHROID) 88 MCG tablet TAKE 1 TABLET DAILY. 30 tablet 0  . metoprolol succinate (TOPROL-XL) 25 MG 24 hr tablet TAKE (1/2) TABLET DAILY. 30 tablet 0  . nitroGLYCERIN (NITROSTAT) 0.4 MG SL tablet Place 1 tablet (0.4 mg total) under the tongue every 5 (five) minutes as needed for chest pain (CP or SOB). 25 tablet 12  . ramipril (ALTACE) 1.25 MG capsule Take 1 capsule (1.25 mg total) by mouth daily. 30 capsule 3  . tamsulosin (FLOMAX) 0.4 MG CAPS capsule TAKE (1) CAPSULE DAILY. (Patient taking differently: TAKE (2) CAPSULES  DAILY AFTER SUPPER) 30 capsule 3  . venlafaxine XR (EFFEXOR XR) 75 MG 24 hr capsule Take 2 capsules (150 mg total) by mouth daily. 30 capsule 5   No current facility-administered medications for this visit.     Neurologic: Headache: No Seizure: No Paresthesias: No  Musculoskeletal: Strength & Muscle Tone: within normal limits Gait & Station: normal Patient leans: NA  Psychiatric Specialty Exam: ROS  Blood pressure 98/62, pulse 79, height 5\' 8"  (1.727 m), weight 185 lb (83.9 kg).Body mass index is 28.13 kg/m.  General Appearance: Fairly Groomed  Eye Contact:  Good  Speech:  Clear and Coherent  Volume:  Normal  Mood:  Euthymic  Affect:  Appropriate  Thought Process:  Coherent  Orientation:  Full (Time, Place, and Person)  Thought Content:  WDL  Suicidal Thoughts:  No  Homicidal Thoughts:  No  Memory:  NA  Judgement:  Good  Insight:  Good  Psychomotor Activity:  Normal  Concentration:  Good  Recall:  Good  Fund of Knowledge: Good  Language: Good  Akathisia:  No  Handed:  Right  AIMS (if indicated):    Assets:  Desire for Improvement  ADL's:  Intact  Cognition: WNL  Sleep:       Treatment  Plan Summary:At this time the patient is doing fairly well. We had a long discussion of his medications. I shared with him that I would rather not be on imipramine for long-term basis given its anticholinergic effects area on the other hand he denies sedation constipation or blurred vision. For now we'll continue it but  we will make some changes. We will start him back on Effexor at a lower dose of 75 mg XR and changes Wellbutrin from 100 mg slow release to dose of 150 mg XL. He'll take Effexor and Wellbutrin in the morning. He'll continue taking Xanax 0.5 mg at night which helps him sleep. For now he'll continue low-dose imipramine 10 mg at night. At the incident significant dose and probably is no reason to make any changes with it. Today the patient did not come in with his wife. Is getting along well with her and seems to be a nonissue. The patient is doing well. He denies any chest pain these days. He has no shortness of breath. He is in pretty good shape. He denies any neurological symptoms.   Jerral Ralph, MD 06/30/2017, 4:45 PM [facility] MD Progress Note  06/30/2017 4:45 PM [name]  MRN:  [id number] Subjective:  Feels well Principal Problem: Major depression, chronic, mild/residual Diagnosis:  Major depression, recurrent residual Today the patient is doing fairly well. He's been having some medical problems. He recently went for Thanksgiving to a length but unfortunately had a urinary tract infection. He also since then has been coughing and recently started on antibiotic. Emotionally he is fairly stable. He denies daily depression. He stays very active at work. He's been unable to exercise. The patient is sleeping well and has an increase in his appetite. His energy is good. Is no problems thinking or concentrating. He is recently having a productive cough but is no shortness of breath. He denies any chest pain. Is a stable relationship with his wife. His kidneys are good. He has 4  grandchildren. Everybody in his home is good. He only issue is that a number mornings he awakens and he feels oversedated. It occurs point of his wife feels uncomfortable with him driving will taken to work. Today reviewed his medications and is evident that he likely needs reduction. He also notes that his memory has change in his and is good. 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 Patient Active Problem List   Diagnosis Date Noted  . Acute coronary syndrome ([place]) [I24.9] 08/30/2015  . Temporal arteritis ([place]) [M31.6] 06/16/2013  . Amaurosis fugax [G45.3] 06/01/2013  . Unspecified hypothyroidism [E03.9] 03/14/2013  . Other and unspecified hyperlipidemia [E78.5] 03/14/2013  . BPH (benign prostatic hypertrophy) [N40.0] 03/14/2013  . Hypogonadism, male [E29.1] 03/14/2013  . Erectile dysfunction [N52.9] 03/14/2013   Total Time spent with patient: 30 minutes  Past Psychiatric History:   Past Medical History:  Past Medical History:  Diagnosis Date  . Anxiety   . Arthritis    might be in back, no problems  . BPH (benign prostatic hyperplasia)   . Depression 1990s  . GERD (gastroesophageal reflux disease)    occasional  . Hypercholesteremia    controled  . Hypothyroid   . Shingles May 2014   "Right face, still has some"  . Temporal arteritis ([place])   . Transient blindness of both eyes     Past Surgical History:  Procedure Laterality Date  . NO PAST SURGERIES    . None     Family History:  Family History  Problem Relation Age of Onset  . Stroke Father        Died, 80s  . Stroke Sister        Living, 72  . Heart disease Mother  Died, 92  . Healthy Daughter   . Diabetes Maternal Grandmother   . Hypertension Neg Hx    Family Psychiatric  History:  Social History:  Social History   Substance and Sexual Activity  Alcohol Use Yes  . Alcohol/week: 1.2 oz  . Types: 2 Shots of liquor per week   Comment: socially      Social History   Substance and Sexual Activity  Drug Use No    Social History   Socioeconomic History  . Marital status: Married    Spouse name: Not on file  . Number of children: Not on file  . Years of education: Not on file  . Highest education level: Not on file  Social Needs  . Financial resource strain: Not on file  . Food insecurity - worry: Not on file  . Food insecurity - inability: Not on file  . Transportation needs - medical: Not on file  . Transportation needs - non-medical: Not on file  Occupational History  . Not on file  Tobacco Use  . Smoking status: Former Smoker    Types: Cigarettes    Last attempt to quit: 08/24/1966    Years since quitting: 50.8  . Smokeless tobacco: Never Used  Substance and Sexual Activity  . Alcohol use: Yes    Alcohol/week: 1.2 oz    Types: 2 Shots of liquor per week    Comment: socially  . Drug use: No  . Sexual activity: Not on file  Other Topics Concern  . Not on file  Social History Narrative   Currently works as a Midwife.  Lives with wife and they have two healthy daughters.   Additional Social History:                         Sleep: Good  Appetite:  Good  Current Medications: Current Outpatient Medications  Medication Sig Dispense Refill  . acetaminophen (TYLENOL) 500 MG tablet Take 500 mg by mouth daily as needed (pain).    [name] albuterol (PROVENTIL HFA;VENTOLIN HFA) 108 (90 Base) MCG/ACT inhaler Inhale 2 puffs into the lungs every 6 (six) hours as needed for wheezing or shortness of breath. 1 Inhaler 0  . ALPRAZolam (XANAX) 0.5 MG tablet 1  qhs 1  q day PRN 45 tablet 5  . aspirin 81 MG chewable tablet Chew 1 tablet (81 mg total) by mouth daily. 30 tablet 3  . atorvastatin (LIPITOR) 40 MG tablet Take 1 tablet (40 mg total) by mouth daily at 6 PM. 30 tablet 3  . buPROPion (WELLBUTRIN XL) 150 MG 24 hr tablet Take 1 tablet (150 mg total) every morning by mouth. 30 tablet 6  . clopidogrel (PLAVIX) 75 MG  tablet Take 1 tablet (75 mg total) by mouth daily with breakfast. 30 tablet 3  . imipramine (TOFRANIL) 10 MG tablet Take 1 tablet (10 mg total) at bedtime by mouth. 30 tablet 5  . levothyroxine (SYNTHROID, LEVOTHROID) 88 MCG tablet TAKE 1 TABLET DAILY. 30 tablet 0  . metoprolol succinate (TOPROL-XL) 25 MG 24 hr tablet TAKE (1/2) TABLET DAILY. 30 tablet 0  . nitroGLYCERIN (NITROSTAT) 0.4 MG SL tablet Place 1 tablet (0.4 mg total) under the tongue every 5 (five) minutes as needed for chest pain (CP or SOB). 25 tablet 12  . ramipril (ALTACE) 1.25 MG capsule Take 1 capsule (1.25 mg total) by mouth daily. 30 capsule 3  . tamsulosin (FLOMAX) 0.4 MG CAPS capsule TAKE (1) CAPSULE  DAILY. (Patient taking differently: TAKE (2) CAPSULES  DAILY AFTER SUPPER) 30 capsule 3  . venlafaxine XR (EFFEXOR XR) 75 MG 24 hr capsule Take 2 capsules (150 mg total) by mouth daily. 30 capsule 5   No current facility-administered medications for this visit.     Lab Results:  No results found for this or any previous visit (from the past 48 hour(s)).  Physical Findings: AIMS:  , ,  ,  ,    CIWA:    COWS:     Musculoskeletal: Strength & Muscle Tone: within normal limits Gait & Station: normal Patient leans: Right  Psychiatric Specialty Exam: ROS  Blood pressure 98/62, pulse 79, height 5\' 8"  (1.727 m), weight 185 lb (83.9 kg).Body mass index is 28.13 kg/m.  General Appearance: Negative  Eye Contact::  Good  Speech:  Clear and Coherent  Volume:  Normal  Mood:  NA  Affect:  Appropriate  Thought Process:  Coherent  Orientation:  Full (Time, Place, and Person)  Thought Content:  WDL  Suicidal Thoughts:  No  Homicidal Thoughts:  No  Memory:  NA  Judgement:  Good  Insight:  Good  Psychomotor Activity:  Normal  Concentration:  Good  Recall:  Good  Fund of Knowledge:Good  Language: Good  Akathisia:  No  Handed:  Right  AIMS (if indicated):     Assets:  Desire for Improvement  ADL's:  Intact  Cognition:  WNL  Sleep:      Treatment Plan Summary:At this time the patient will continue taking imipramine 10 mg at night and Xanax 0.5 mg. One of his problems with sleep or these medications she does well. Is not on it major problem is that of clinical depression. Again he has problems with compliance. She's taking [place] 150 mg XL all the complaints about extensive. He does state. For reasons that are not clear he stopped his Effexor. Today he is agreed to restart his Effexor 75 mg XL each morning. Taking Effexor with [place] morning and he takes Xanax and imipramine night. He'll return to see me in 3 months for an opportunity to have some supportive psychotherapy. Presently the patient is not in therapy. I do not think this patient is suicidal at this time. Physically he is very stable. [facility] MD Progress Note  06/30/2017 4:45 PM ALWYN CORDNER  MRN:  [id number] Subjective:  Feels well Principal Problem: Major depression, chronic, mild/residual Diagnosis:  Major depression, recurrent residual Today the patient is doing fairly well. He's been having some medical problems. He recently went for Thanksgiving to a length but unfortunately had a urinary tract infection. He also since then has been coughing and recently started on antibiotic. Emotionally he is fairly stable. He denies daily depression. He stays very active at work. He's been unable to exercise. The patient is sleeping well and has an increase in his appetite. His energy is good. Is no problems thinking or concentrating. He is recently having a productive cough but is no shortness of breath. He denies any chest pain. Is a stable relationship with his wife. His kidneys are good. He has 4 grandchildren. Everybody in his home is good. He only issue is that a number mornings he awakens and he feels oversedated. It occurs point of his wife feels uncomfortable with him driving will taken to work. Today reviewed his medications and is evident that he  likely needs reduction. He also notes that his memory has change in his and is good. 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 But for now been discontinued. Patient Active Problem List   Diagnosis Date Noted  . Acute coronary syndrome ([place]) [I24.9] 08/30/2015  . Temporal arteritis ([place]) [M31.6] 06/16/2013  . Amaurosis fugax [G45.3] 06/01/2013  . Unspecified hypothyroidism [E03.9] 03/14/2013  . Other and unspecified hyperlipidemia [E78.5] 03/14/2013  . BPH (benign prostatic hypertrophy) [N40.0] 03/14/2013  . Hypogonadism, male [E29.1] 03/14/2013  . Erectile dysfunction [N52.9] 03/14/2013   Total Time spent with patient: 30 minutes  Past Psychiatric History:   Past Medical History:  Past Medical History:  Diagnosis Date  . Anxiety   . Arthritis    might be in back, no problems  . BPH (benign prostatic hyperplasia)   . Depression 1990s  . GERD (gastroesophageal reflux disease)    occasional  . Hypercholesteremia    controled  . Hypothyroid   . Shingles May 2014   "Right face, still has some"  . Temporal arteritis ([place])   . Transient blindness of both eyes     Past Surgical History:  Procedure Laterality Date  . NO PAST SURGERIES    . None     Family History:  Family History  Problem Relation Age of Onset  . Stroke Father        Died, 80s  . Stroke Sister        Living, 27  . Heart disease Mother        Died, 34  . Healthy Daughter   . Diabetes Maternal Grandmother   . Hypertension Neg Hx    Family Psychiatric  History:  Social History:  Social History   Substance and Sexual Activity  Alcohol Use Yes  . Alcohol/week: 1.2 oz  . Types: 2 Shots of liquor per week   Comment: socially     Social History   Substance and Sexual Activity  Drug Use No    Social History   Socioeconomic History  . Marital status: Married    Spouse name: Not on file  . Number of children: Not on file  . Years of education: Not on file  . Highest education  level: Not on file  Social Needs  . Financial resource strain: Not on file  . Food insecurity - worry: Not on file  . Food insecurity - inability: Not on file  . Transportation needs - medical: Not on file  . Transportation needs - non-medical: Not on file  Occupational History  . Not on file  Tobacco Use  . Smoking status: Former Smoker    Types: Cigarettes    Last attempt to quit: 08/24/1966    Years since quitting: 50.8  . Smokeless tobacco: Never Used  Substance and Sexual Activity  . Alcohol use: Yes    Alcohol/week: 1.2 oz    Types: 2 Shots of liquor per week    Comment: socially  . Drug use: No  . Sexual activity: Not on file  Other Topics Concern  . Not on file  Social History Narrative   Currently works as a Midwife.  Lives with wife and they have two healthy daughters.   Additional Social History:                         Sleep: Good  Appetite:  Good  Current Medications: Current Outpatient Medications  Medication Sig Dispense Refill  . acetaminophen (TYLENOL) 500 MG tablet Take 500 mg by mouth daily as needed (pain).    [name]  albuterol (PROVENTIL HFA;VENTOLIN HFA) 108 (90 Base) MCG/ACT inhaler Inhale 2 puffs into the lungs every 6 (six) hours as needed for wheezing or shortness of breath. 1 Inhaler 0  . ALPRAZolam (XANAX) 0.5 MG tablet 1  qhs 1  q day PRN 45 tablet 5  . aspirin 81 MG chewable tablet Chew 1 tablet (81 mg total) by mouth daily. 30 tablet 3  . atorvastatin (LIPITOR) 40 MG tablet Take 1 tablet (40 mg total) by mouth daily at 6 PM. 30 tablet 3  . buPROPion (WELLBUTRIN XL) 150 MG 24 hr tablet Take 1 tablet (150 mg total) every morning by mouth. 30 tablet 6  . clopidogrel (PLAVIX) 75 MG tablet Take 1 tablet (75 mg total) by mouth daily with breakfast. 30 tablet 3  . imipramine (TOFRANIL) 10 MG tablet Take 1 tablet (10 mg total) at bedtime by mouth. 30 tablet 5  . levothyroxine (SYNTHROID, LEVOTHROID) 88 MCG tablet TAKE 1 TABLET DAILY. 30  tablet 0  . metoprolol succinate (TOPROL-XL) 25 MG 24 hr tablet TAKE (1/2) TABLET DAILY. 30 tablet 0  . nitroGLYCERIN (NITROSTAT) 0.4 MG SL tablet Place 1 tablet (0.4 mg total) under the tongue every 5 (five) minutes as needed for chest pain (CP or SOB). 25 tablet 12  . ramipril (ALTACE) 1.25 MG capsule Take 1 capsule (1.25 mg total) by mouth daily. 30 capsule 3  . tamsulosin (FLOMAX) 0.4 MG CAPS capsule TAKE (1) CAPSULE DAILY. (Patient taking differently: TAKE (2) CAPSULES  DAILY AFTER SUPPER) 30 capsule 3  . venlafaxine XR (EFFEXOR XR) 75 MG 24 hr capsule Take 2 capsules (150 mg total) by mouth daily. 30 capsule 5   No current facility-administered medications for this visit.     Lab Results:  No results found for this or any previous visit (from the past 48 hour(s)).  Physical Findings: AIMS:  , ,  ,  ,    CIWA:    COWS:     Musculoskeletal: Strength & Muscle Tone: within normal limits Gait & Station: normal Patient leans: Right  Psychiatric Specialty Exam: ROS  Blood pressure 98/62, pulse 79, height 5\' 8"  (1.727 m), weight 185 lb (83.9 kg).Body mass index is 28.13 kg/m.  General Appearance: Negative  Eye Contact::  Good  Speech:  Clear and Coherent  Volume:  Normal  Mood:  NA  Affect:  Appropriate  Thought Process:  Coherent  Orientation:  Full (Time, Place, and Person)  Thought Content:  WDL  Suicidal Thoughts:  No  Homicidal Thoughts:  No  Memory:  NA  Judgement:  Good  Insight:  Good  Psychomotor Activity:  Normal  Concentration:  Good  Recall:  Good  Fund of Knowledge:Good  Language: Good  Akathisia:  No  Handed:  Right  AIMS (if indicated):     Assets:  Desire for Improvement  ADL's:  Intact  Cognition: WNL  Sleep:      Treatment Plan Summary: The patient is #1 problem is major depression. He is doing great taking Wellbutrin 150 XL. He has associated anxiety is a small dose of Xanax for her. Takes 0.5 mg a day and an extra small amount he needs it. The  patient is sleeping well. He is functioning very well. He is not suicidal. His relationship. Return to See Me in 5 Months for a 30 Minute Visit.

## 2017-07-06 ENCOUNTER — Ambulatory Visit (INDEPENDENT_AMBULATORY_CARE_PROVIDER_SITE_OTHER): Payer: Medicare Other | Admitting: Endocrinology

## 2017-07-06 ENCOUNTER — Encounter: Payer: Self-pay | Admitting: Endocrinology

## 2017-07-06 VITALS — BP 118/66 | HR 65 | Ht 68.0 in | Wt 187.0 lb

## 2017-07-06 DIAGNOSIS — E063 Autoimmune thyroiditis: Secondary | ICD-10-CM

## 2017-07-06 DIAGNOSIS — E7800 Pure hypercholesterolemia, unspecified: Secondary | ICD-10-CM | POA: Diagnosis not present

## 2017-07-06 DIAGNOSIS — R7301 Impaired fasting glucose: Secondary | ICD-10-CM | POA: Diagnosis not present

## 2017-07-06 DIAGNOSIS — E559 Vitamin D deficiency, unspecified: Secondary | ICD-10-CM

## 2017-07-06 NOTE — Patient Instructions (Addendum)
Colace stool softener  Walk on treadmill daily

## 2017-07-06 NOTE — Progress Notes (Signed)
Subjective:      Patient ID: Benjamin Hahn, male   DOB: [birth year], 83 y.o.   MRN: [id number]  Chief complaint: Follow-up Of various problems  HPI      HYPOTHYROIDISM: This is long-standing and he is usually on a stable dose since 2014 Currently taking 88 g and this has been a stable dose recently also  He feels fairly good overall with his energy level On his last visit he was taking his levothyroxine after breakfast and was told to change it to before breakfast At that time his TSH was higher than usual at 5.7   Lab Results  Component Value Date   TSH 5.73 (H) 03/03/2017   TSH 2.91 11/04/2016   TSH 1.81 03/02/2016   FREET4 0.91 03/03/2017   FREET4 0.96 11/04/2016   FREET4 0.87 03/02/2016     ?  PREDIABETES  His fasting glucose was 102, random glucose 84 recently   A1c is 5.7, previously 6.1 He has a grandmother with diabetes  He has been not motivated to exercise, previously had been walking on the treadmill or going to the gym However has been able to keep his weight about the same  Wt Readings from Last 3 Encounters:  07/06/17 187 lb (84.8 kg)  03/05/17 189 lb (85.7 kg)  12/17/16 189 lb (85.7 kg)   Lab Results  Component Value Date   HGBA1C 5.7 06/30/2017   HGBA1C 6.1 06/01/2013   Lab Results  Component Value Date   LDLCALC 57 03/03/2017   CREATININE 1.42 06/30/2017     DEPRESSION: Treated by psychiatrist. Well controlled with Wellbutrin,  taking 150 mg; also taking imipramine 10 mg at night to help sleep Has been previously also on Effexor but he has not been taking this recently  Needs occasional Xanax for anxiety   HYPOGONADISM: See review of systems  LIPIDS: See review of systems  Allergies as of 07/06/2017   No Known Allergies     Medication List        Accurate as of 07/06/17  8:44 AM. Always use your most recent med list.          acetaminophen 500 MG tablet Commonly known as:  TYLENOL Take 500 mg by mouth daily as  needed (pain).   albuterol 108 (90 Base) MCG/ACT inhaler Commonly known as:  PROVENTIL HFA;VENTOLIN HFA Inhale 2 puffs into the lungs every 6 (six) hours as needed for wheezing or shortness of breath.   ALPRAZolam 0.5 MG tablet Commonly known as:  XANAX 1  qhs 1  q day PRN   aspirin 81 MG chewable tablet Chew 1 tablet (81 mg total) by mouth daily.   atorvastatin 40 MG tablet Commonly known as:  LIPITOR Take 1 tablet (40 mg total) by mouth daily at 6 PM.   buPROPion 150 MG 24 hr tablet Commonly known as:  WELLBUTRIN XL Take 1 tablet (150 mg total) every morning by mouth.   clopidogrel 75 MG tablet Commonly known as:  PLAVIX Take 1 tablet (75 mg total) by mouth daily with breakfast.   imipramine 10 MG tablet Commonly known as:  TOFRANIL Take 1 tablet (10 mg total) at bedtime by mouth.   levothyroxine 88 MCG tablet Commonly known as:  SYNTHROID, LEVOTHROID TAKE 1 TABLET DAILY.   metoprolol succinate 25 MG 24 hr tablet Commonly known as:  TOPROL-XL TAKE (1/2) TABLET DAILY.   nitroGLYCERIN 0.4 MG SL tablet Commonly known as:  NITROSTAT Place 1 tablet (0.4 mg total) under  the tongue every 5 (five) minutes as needed for chest pain (CP or SOB).   ramipril 1.25 MG capsule Commonly known as:  ALTACE Take 1 capsule (1.25 mg total) by mouth daily.   tamsulosin 0.4 MG Caps capsule Commonly known as:  FLOMAX TAKE (1) CAPSULE DAILY.   venlafaxine XR 75 MG 24 hr capsule Commonly known as:  EFFEXOR XR Take 2 capsules (150 mg total) by mouth daily.       Review of Systems    Long-standing hypogonadism: He has been treated by his urologist.  From his urologist he has been treated withTestosterone injections, unknown dose He previously had  adequate levels on the Testopel which was too expensive He still feels fairly good with his energy level and his level was checked in July, also he thinks he has had levels checked by urologist     Lab Results  Component Value Date     TESTOSTERONE 361.35 03/03/2017     Hypercholesterolemia: This has been long-standing Since his MI he has been on 40 mg Lipitor, previously on 10 mg LDL is below 70 Consistently   Lab Results  Component Value Date   CHOL 121 03/03/2017   HDL 53.50 03/03/2017   LDLCALC 57 03/03/2017   TRIG 51.0 03/03/2017   CHOLHDL 2 03/03/2017      BPH: He  takes 0.8 mg of Flomax from his urologist with fairly good control of nocturia and frequency  He is asking about tendency to having a bowel movement when he tries to go to the bathroom to empty his bladder.  Also may have relatively harder stools also  May have occasional mild vertigo     Objective:   Physical Exam BP 118/66   Pulse 65   Ht 5\' 8"  (1.727 m)   Wt 187 lb (84.8 kg)   SpO2 95%   BMI 28.43 kg/m   Standing blood pressure 118/68   Assessment:      Hypogonadism: Adequately treated with injections done by urologist and his last level is normal, no unusual fatigue.  Discussed that he needs to talk to his urologist about tendency to have bowel movements when he is urinating.  However since he is appearing to be somewhat constipated he can try taking stool softeners first     Impaired fasting glucose: His blood sugar was not check fasting but his A1c is better at 5.7 compared to 6.1 previously However have emphasized to the patient the need for starting exercise    Hypothyroidism: Has been usually replaced with 88 g of levothyroxine, his last level of TSH was higher because of taking the medication postprandially and he is taking it before breakfast now   Depression, anxiety and insomnia: Well controlled, he is only on Wellbutrin for depression now, continue follow-up with psychiatrist  Hypercholesterolemia on treatment: Adequately controlled with 40 mg  History of CAD, although blood pressure is low normal he is being treated by cardiologist with low-dose ramipril and metoprolol and has no orthostatic  symptoms      Plan:      No change in medications  He needs to start walking on treadmill for exercise  Continue watching portions and keep his weight down  Follow-up in 4 months for physical and fasting labs    [facility]

## 2017-07-08 DIAGNOSIS — E291 Testicular hypofunction: Secondary | ICD-10-CM | POA: Diagnosis not present

## 2017-07-22 DIAGNOSIS — I25118 Atherosclerotic heart disease of native coronary artery with other forms of angina pectoris: Secondary | ICD-10-CM | POA: Diagnosis not present

## 2017-07-22 DIAGNOSIS — J45909 Unspecified asthma, uncomplicated: Secondary | ICD-10-CM | POA: Diagnosis not present

## 2017-07-22 DIAGNOSIS — E785 Hyperlipidemia, unspecified: Secondary | ICD-10-CM | POA: Diagnosis not present

## 2017-07-22 DIAGNOSIS — I10 Essential (primary) hypertension: Secondary | ICD-10-CM | POA: Diagnosis not present

## 2017-07-22 DIAGNOSIS — M1990 Unspecified osteoarthritis, unspecified site: Secondary | ICD-10-CM | POA: Diagnosis not present

## 2017-07-22 DIAGNOSIS — E039 Hypothyroidism, unspecified: Secondary | ICD-10-CM | POA: Diagnosis not present

## 2017-07-22 DIAGNOSIS — E291 Testicular hypofunction: Secondary | ICD-10-CM | POA: Diagnosis not present

## 2017-07-23 ENCOUNTER — Other Ambulatory Visit: Payer: Self-pay | Admitting: Endocrinology

## 2017-08-03 ENCOUNTER — Other Ambulatory Visit: Payer: Self-pay | Admitting: Endocrinology

## 2017-08-06 DIAGNOSIS — E291 Testicular hypofunction: Secondary | ICD-10-CM | POA: Diagnosis not present

## 2017-08-20 DIAGNOSIS — E291 Testicular hypofunction: Secondary | ICD-10-CM | POA: Diagnosis not present

## 2017-08-23 ENCOUNTER — Other Ambulatory Visit: Payer: Self-pay | Admitting: Endocrinology

## 2017-09-02 DIAGNOSIS — E291 Testicular hypofunction: Secondary | ICD-10-CM | POA: Diagnosis not present

## 2017-09-16 DIAGNOSIS — E291 Testicular hypofunction: Secondary | ICD-10-CM | POA: Diagnosis not present

## 2017-09-23 ENCOUNTER — Other Ambulatory Visit: Payer: Self-pay | Admitting: Endocrinology

## 2017-09-24 ENCOUNTER — Other Ambulatory Visit: Payer: Self-pay | Admitting: Endocrinology

## 2017-09-24 DIAGNOSIS — M25511 Pain in right shoulder: Secondary | ICD-10-CM | POA: Diagnosis not present

## 2017-09-24 DIAGNOSIS — M542 Cervicalgia: Secondary | ICD-10-CM | POA: Diagnosis not present

## 2017-10-04 DIAGNOSIS — R350 Frequency of micturition: Secondary | ICD-10-CM | POA: Diagnosis not present

## 2017-10-04 DIAGNOSIS — R300 Dysuria: Secondary | ICD-10-CM | POA: Diagnosis not present

## 2017-10-04 DIAGNOSIS — N400 Enlarged prostate without lower urinary tract symptoms: Secondary | ICD-10-CM | POA: Diagnosis not present

## 2017-10-04 DIAGNOSIS — R3914 Feeling of incomplete bladder emptying: Secondary | ICD-10-CM | POA: Diagnosis not present

## 2017-10-05 ENCOUNTER — Ambulatory Visit (INDEPENDENT_AMBULATORY_CARE_PROVIDER_SITE_OTHER): Payer: Medicare Other | Admitting: Endocrinology

## 2017-10-05 ENCOUNTER — Telehealth: Payer: Self-pay | Admitting: Endocrinology

## 2017-10-05 ENCOUNTER — Encounter: Payer: Self-pay | Admitting: Endocrinology

## 2017-10-05 VITALS — BP 126/82 | HR 69 | Temp 98.4°F | Ht 67.5 in | Wt 187.0 lb

## 2017-10-05 DIAGNOSIS — J069 Acute upper respiratory infection, unspecified: Secondary | ICD-10-CM | POA: Diagnosis not present

## 2017-10-05 MED ORDER — PREDNISONE 10 MG PO TABS: ORAL_TABLET | ORAL | 0 refills | Status: DC

## 2017-10-05 MED ORDER — HYDROCOD POLST-CPM POLST ER 10-8 MG/5ML PO SUER: 5.0000 mL | Freq: Two times a day (BID) | ORAL | 0 refills | Status: DC | PRN

## 2017-10-05 NOTE — Patient Instructions (Addendum)
Mucinex DM 2x daily  Albuterol at bedtime

## 2017-10-05 NOTE — Progress Notes (Signed)
Chief complaint: Cough  History of Present Illness:  He came in complaining of cough for the last 2 days or so This appears to be getting worse today and he has difficulty talking on the telephone because of cough He thinks his cough is mostly nonproductive but will get some clear sputum also He has difficulty sleeping at night because of cough No fever, sore throat, headache but has some runny nose and no chills  Also last night he had some wheezing currently  Allergies as of 10/05/2017   No Known Allergies     Medication List        Accurate as of 10/05/17  4:51 PM. Always use your most recent med list.          acetaminophen 500 MG tablet Commonly known as:  TYLENOL Take 500 mg by mouth daily as needed (pain).   albuterol 108 (90 Base) MCG/ACT inhaler Commonly known as:  PROVENTIL HFA;VENTOLIN HFA Inhale 2 puffs into the lungs every 6 (six) hours as needed for wheezing or shortness of breath.   ALPRAZolam 0.5 MG tablet Commonly known as:  XANAX 1  qhs 1  q day PRN   aspirin 81 MG chewable tablet Chew 1 tablet (81 mg total) by mouth daily.   atorvastatin 40 MG tablet Commonly known as:  LIPITOR Take 1 tablet (40 mg total) by mouth daily at 6 PM.   buPROPion 150 MG 24 hr tablet Commonly known as:  WELLBUTRIN XL Take 1 tablet (150 mg total) every morning by mouth.   clobetasol cream 0.05 % Commonly known as:  TEMOVATE APPLY AS DIRECTED.   clopidogrel 75 MG tablet Commonly known as:  PLAVIX Take 1 tablet (75 mg total) by mouth daily with breakfast.   hydrOXYzine 10 MG tablet Commonly known as:  ATARAX/VISTARIL TAKE 1 TO 3 TABLETS EVERY 4 TO 6 HOURS AS NEEDED FOR ITCHING. *MAY CAUSE DROWSINESS*   imipramine 10 MG tablet Commonly known as:  TOFRANIL Take 1 tablet (10 mg total) at bedtime by mouth.   levothyroxine 88 MCG tablet Commonly known as:  SYNTHROID, LEVOTHROID TAKE 1 TABLET BY MOUTH DAILY.   metoprolol succinate 25 MG 24 hr  tablet Commonly known as:  TOPROL-XL TAKE (1/2) TABLET DAILY.   nitroGLYCERIN 0.4 MG SL tablet Commonly known as:  NITROSTAT Place 1 tablet (0.4 mg total) under the tongue every 5 (five) minutes as needed for chest pain (CP or SOB).   ramipril 1.25 MG capsule Commonly known as:  ALTACE Take 1 capsule (1.25 mg total) by mouth daily.   tamsulosin 0.4 MG Caps capsule Commonly known as:  FLOMAX TAKE (1) CAPSULE DAILY.   venlafaxine XR 75 MG 24 hr capsule Commonly known as:  EFFEXOR XR Take 2 capsules (150 mg total) by mouth daily.       Allergies: No Known Allergies  Past Medical History:  Diagnosis Date  . Anxiety   . Arthritis    might be in back, no problems  . BPH (benign prostatic hyperplasia)   . Depression 1990s  . GERD (gastroesophageal reflux disease)    occasional  . Hypercholesteremia    controled  . Hypothyroid   . Shingles May 2014   "Right face, still has some"  . Temporal arteritis ([place])   . Transient blindness of both eyes     Past Surgical History:  Procedure Laterality Date  . ARTERY BIOPSY Right 06/20/2013   Procedure: BIOPSY TEMPORAL ARTERY RIGHT;  Surgeon: Earnstine Regal,  MD;  Location: [facility];  Service: General;  Laterality: Right;  . CARDIAC CATHETERIZATION N/A 09/02/2015   Procedure: Left Heart Cath and Coronary Angiography;  Surgeon: Charolette Forward, MD;  Location: [place] [facility];  Service: Cardiovascular;  Laterality: N/A;  . CARDIAC CATHETERIZATION N/A 09/02/2015   Procedure: Coronary Stent Intervention;  Surgeon: Charolette Forward, MD;  Location: [place] [facility];  Service: Cardiovascular;  Laterality: N/A;  1.  mid RCA      (3.0/28mm Xience) 2.  Mid LAD      (3.0/23mm Xience)  . NO PAST SURGERIES    . None      Family History  Problem Relation Age of Onset  . Stroke Father        Died, 80s  . Stroke Sister        Living, 14  . Heart disease Mother        Died, 62  . Healthy Daughter   . Diabetes Maternal Grandmother   .  Hypertension Neg Hx     Social History:  reports that he quit smoking about 51 years ago. His smoking use included cigarettes. he has never used smokeless tobacco. He reports that he drinks about 1.2 oz of alcohol per week. He reports that he does not use drugs.   No visits with results within 1 Week(s) from this visit.  Latest known visit with results is:  Appointment on 06/30/2017  Component Date Value Ref Range Status  . Sodium 06/30/2017 138  135 - 145 mEq/L Final  . Potassium 06/30/2017 4.1  3.5 - 5.1 mEq/L Final  . Chloride 06/30/2017 103  96 - 112 mEq/L Final  . CO2 06/30/2017 29  19 - 32 mEq/L Final  . Glucose, Bld 06/30/2017 84  70 - 99 mg/dL Final  . BUN 06/30/2017 22  6 - 23 mg/dL Final  . Creatinine, Ser 06/30/2017 1.42  0.40 - 1.50 mg/dL Final  . Calcium 06/30/2017 9.1  8.4 - 10.5 mg/dL Final  . GFR 06/30/2017 50.68* >60.00 mL/min Final  . Hgb A1c MFr Bld 06/30/2017 5.7  4.6 - 6.5 % Final   Glycemic Control Guidelines for People with Diabetes:Non Diabetic:  <6%Goal of Therapy: <7%Additional Action Suggested:  >8%     EXAM:  BP 126/82 (BP Location: Left Arm, Patient Position: Sitting, Cuff Size: Normal)   Pulse 69   Temp 98.4 F (36.9 C)   Ht 5' 7.5" (1.715 m)   Wt 187 lb (84.8 kg)   SpO2 95%   BMI 28.86 kg/m   Physical Exam   Pharynx does not look erythematous His lungs showed only minimal wheezing posteriorly No crepitations No labored breathing  Assessment/Plan:   Viral acute bronchitis with mild bronchospasm Since he has had repeated episodes of the same problem with increased bronchospasm with the condition progressing he will go back to prednisone and Tussionex as before He can take albuterol at night at least if he has any wheezing, he has a prescription from before He will also take Mucinex OTC twice a day especially if his prescription and not covered by insurance   Elayne Snare 10/05/2017, 4:51 PM

## 2017-10-07 DIAGNOSIS — E291 Testicular hypofunction: Secondary | ICD-10-CM | POA: Diagnosis not present

## 2017-10-07 NOTE — Telephone Encounter (Signed)
Pt is needing the following sent over to pharmacy   Benzonatate for cough.     predniSONE (DELTASONE) 10 MG tablet   [place], [place]

## 2017-10-08 NOTE — Telephone Encounter (Signed)
Pt states because he is still coughing and the tessalon is only that helps. Please advise

## 2017-10-08 NOTE — Telephone Encounter (Signed)
Please advise on below  

## 2017-10-08 NOTE — Telephone Encounter (Signed)
Prednisone was already sent, need to know why he wants this

## 2017-10-09 ENCOUNTER — Other Ambulatory Visit: Payer: Self-pay | Admitting: Endocrinology

## 2017-10-09 MED ORDER — BENZONATATE 200 MG PO CAPS: 200.0000 mg | ORAL_CAPSULE | Freq: Two times a day (BID) | ORAL | 0 refills | Status: DC | PRN

## 2017-10-10 NOTE — Telephone Encounter (Signed)
Tessalon has been sent

## 2017-10-21 ENCOUNTER — Other Ambulatory Visit: Payer: Self-pay | Admitting: Endocrinology

## 2017-10-21 DIAGNOSIS — M1990 Unspecified osteoarthritis, unspecified site: Secondary | ICD-10-CM | POA: Diagnosis not present

## 2017-10-21 DIAGNOSIS — E291 Testicular hypofunction: Secondary | ICD-10-CM | POA: Diagnosis not present

## 2017-10-21 DIAGNOSIS — I25118 Atherosclerotic heart disease of native coronary artery with other forms of angina pectoris: Secondary | ICD-10-CM | POA: Diagnosis not present

## 2017-10-21 DIAGNOSIS — E039 Hypothyroidism, unspecified: Secondary | ICD-10-CM | POA: Diagnosis not present

## 2017-10-21 DIAGNOSIS — E785 Hyperlipidemia, unspecified: Secondary | ICD-10-CM | POA: Diagnosis not present

## 2017-10-21 DIAGNOSIS — I10 Essential (primary) hypertension: Secondary | ICD-10-CM | POA: Diagnosis not present

## 2017-10-21 DIAGNOSIS — J45909 Unspecified asthma, uncomplicated: Secondary | ICD-10-CM | POA: Diagnosis not present

## 2017-10-29 ENCOUNTER — Encounter: Payer: Self-pay | Admitting: Endocrinology

## 2017-10-29 ENCOUNTER — Ambulatory Visit (INDEPENDENT_AMBULATORY_CARE_PROVIDER_SITE_OTHER): Payer: Medicare Other | Admitting: Endocrinology

## 2017-10-29 VITALS — BP 120/70 | Wt 186.0 lb

## 2017-10-29 DIAGNOSIS — L97901 Non-pressure chronic ulcer of unspecified part of unspecified lower leg limited to breakdown of skin: Secondary | ICD-10-CM | POA: Diagnosis not present

## 2017-10-29 MED ORDER — MUPIROCIN 2 % EX OINT: 1.0000 "application " | TOPICAL_OINTMENT | Freq: Two times a day (BID) | CUTANEOUS | 0 refills | Status: DC

## 2017-10-29 NOTE — Progress Notes (Signed)
Patient ID: Benjamin Hahn, male   DOB: 1933/05/13, 84 y.o.   MRN: [id number]          Chief complaint: Painful sore on the leg  History of Present Illness:  A couple of days ago patient noticed an open area on his right shin without any known injury. He says that he has been applying a Band-Aid only and no local ointment  He came in today because he was having some pain in the area of the sore but no swelling of the leg He has not had any problems with his skin previously   Allergies as of 10/29/2017   No Known Allergies     Medication List        Accurate as of 10/29/17 11:35 AM. Always use your most recent med list.          acetaminophen 500 MG tablet Commonly known as:  TYLENOL Take 500 mg by mouth daily as needed (pain).   albuterol 108 (90 Base) MCG/ACT inhaler Commonly known as:  PROVENTIL HFA;VENTOLIN HFA Inhale 2 puffs into the lungs every 6 (six) hours as needed for wheezing or shortness of breath.   ALPRAZolam 0.5 MG tablet Commonly known as:  XANAX 1  qhs 1  q day PRN   aspirin 81 MG chewable tablet Chew 1 tablet (81 mg total) by mouth daily.   atorvastatin 40 MG tablet Commonly known as:  LIPITOR Take 1 tablet (40 mg total) by mouth daily at 6 PM.   benzonatate 200 MG capsule Commonly known as:  TESSALON Take 1 capsule (200 mg total) by mouth 2 (two) times daily as needed for cough.   buPROPion 150 MG 24 hr tablet Commonly known as:  WELLBUTRIN XL Take 1 tablet (150 mg total) every morning by mouth.   chlorpheniramine-HYDROcodone 10-8 MG/5ML Suer Commonly known as:  TUSSIONEX PENNKINETIC ER Take 5 mLs by mouth every 12 (twelve) hours as needed for cough.   clobetasol cream 0.05 % Commonly known as:  TEMOVATE APPLY AS DIRECTED.   clopidogrel 75 MG tablet Commonly known as:  PLAVIX Take 1 tablet (75 mg total) by mouth daily with breakfast.   hydrOXYzine 10 MG tablet Commonly known as:  ATARAX/VISTARIL TAKE 1 TO 3 TABLETS EVERY 4 TO 6 HOURS  AS NEEDED FOR ITCHING. *MAY CAUSE DROWSINESS*   imipramine 10 MG tablet Commonly known as:  TOFRANIL Take 1 tablet (10 mg total) at bedtime by mouth.   levothyroxine 88 MCG tablet Commonly known as:  SYNTHROID, LEVOTHROID TAKE 1 TABLET BY MOUTH DAILY.   metoprolol succinate 25 MG 24 hr tablet Commonly known as:  TOPROL-XL TAKE (1/2) TABLET DAILY.   nitroGLYCERIN 0.4 MG SL tablet Commonly known as:  NITROSTAT Place 1 tablet (0.4 mg total) under the tongue every 5 (five) minutes as needed for chest pain (CP or SOB).   ramipril 1.25 MG capsule Commonly known as:  ALTACE Take 1 capsule (1.25 mg total) by mouth daily.   tamsulosin 0.4 MG Caps capsule Commonly known as:  FLOMAX TAKE (1) CAPSULE DAILY.   venlafaxine XR 75 MG 24 hr capsule Commonly known as:  EFFEXOR XR Take 2 capsules (150 mg total) by mouth daily.       Allergies: No Known Allergies  Past Medical History:  Diagnosis Date  . Anxiety   . Arthritis    might be in back, no problems  . BPH (benign prostatic hyperplasia)   . Depression 1990s  . GERD (gastroesophageal reflux disease)  occasional  . Hypercholesteremia    controled  . Hypothyroid   . Shingles May 2014   "Right face, still has some"  . Temporal arteritis ([place])   . Transient blindness of both eyes     Past Surgical History:  Procedure Laterality Date  . ARTERY BIOPSY Right 06/20/2013   Procedure: BIOPSY TEMPORAL ARTERY RIGHT;  Surgeon: Earnstine Regal, MD;  Location: [facility];  Service: General;  Laterality: Right;  . CARDIAC CATHETERIZATION N/A 09/02/2015   Procedure: Left Heart Cath and Coronary Angiography;  Surgeon: Charolette Forward, MD;  Location: [place] [facility];  Service: Cardiovascular;  Laterality: N/A;  . CARDIAC CATHETERIZATION N/A 09/02/2015   Procedure: Coronary Stent Intervention;  Surgeon: Charolette Forward, MD;  Location: [place] [facility];  Service: Cardiovascular;  Laterality: N/A;  1.  mid RCA      (3.0/28mm Xience) 2.  Mid  LAD      (3.0/23mm Xience)  . NO PAST SURGERIES    . None      Family History  Problem Relation Age of Onset  . Stroke Father        Died, 80s  . Stroke Sister        Living, 66  . Heart disease Mother        Died, 45  . Healthy Daughter   . Diabetes Maternal Grandmother   . Hypertension Neg Hx     Social History:  reports that he quit smoking about 51 years ago. His smoking use included cigarettes. he has never used smokeless tobacco. He reports that he drinks about 1.2 oz of alcohol per week. He reports that he does not use drugs.   No visits with results within 1 Week(s) from this visit.  Latest known visit with results is:  Appointment on 06/30/2017  Component Date Value Ref Range Status  . Sodium 06/30/2017 138  135 - 145 mEq/L Final  . Potassium 06/30/2017 4.1  3.5 - 5.1 mEq/L Final  . Chloride 06/30/2017 103  96 - 112 mEq/L Final  . CO2 06/30/2017 29  19 - 32 mEq/L Final  . Glucose, Bld 06/30/2017 84  70 - 99 mg/dL Final  . BUN 06/30/2017 22  6 - 23 mg/dL Final  . Creatinine, Ser 06/30/2017 1.42  0.40 - 1.50 mg/dL Final  . Calcium 06/30/2017 9.1  8.4 - 10.5 mg/dL Final  . GFR 06/30/2017 50.68* >60.00 mL/min Final  . Hgb A1c MFr Bld 06/30/2017 5.7  4.6 - 6.5 % Final   Glycemic Control Guidelines for People with Diabetes:Non Diabetic:  <6%Goal of Therapy: <7%Additional Action Suggested:  >8%      Physical Exam  BP 120/70 (BP Location: Left Arm, Patient Position: Sitting, Cuff Size: Normal)   Wt 186 lb (84.4 kg)   BMI 28.70 kg/m   He has a 1.5cm looking superficial clean ulcer on his right shin which has no surrounding redness or warmth There is no exudate No swelling of the leg Right pedal pulses normal  Assessment/Plan:   Skin tear associated with superficial ulcer, noninfected currently Discussed that he needs to clean this with peroxide and apply Bactroban topically and had a nonsticking dressing He will call if he has any redness around the area or  swelling of the leg starting Bactroban prescription has been sent   Elayne Snare 10/29/2017, 11:35 AM

## 2017-11-01 ENCOUNTER — Other Ambulatory Visit (INDEPENDENT_AMBULATORY_CARE_PROVIDER_SITE_OTHER): Payer: Medicare Other

## 2017-11-01 DIAGNOSIS — E063 Autoimmune thyroiditis: Secondary | ICD-10-CM

## 2017-11-01 DIAGNOSIS — E7800 Pure hypercholesterolemia, unspecified: Secondary | ICD-10-CM | POA: Diagnosis not present

## 2017-11-01 DIAGNOSIS — E559 Vitamin D deficiency, unspecified: Secondary | ICD-10-CM

## 2017-11-01 DIAGNOSIS — R7301 Impaired fasting glucose: Secondary | ICD-10-CM | POA: Diagnosis not present

## 2017-11-01 LAB — COMPREHENSIVE METABOLIC PANEL
ALT: 10 U/L (ref 0–53)
AST: 12 U/L (ref 0–37)
Albumin: 3.7 g/dL (ref 3.5–5.2)
Alkaline Phosphatase: 48 U/L (ref 39–117)
BUN: 16 mg/dL (ref 6–23)
CALCIUM: 9.1 mg/dL (ref 8.4–10.5)
CO2: 29 mEq/L (ref 19–32)
Chloride: 103 mEq/L (ref 96–112)
Creatinine, Ser: 1.48 mg/dL (ref 0.40–1.50)
GFR: 48.28 mL/min — AB (ref >60.00)
Glucose, Bld: 110 mg/dL — ABNORMAL HIGH (ref 70–99)
POTASSIUM: 4.4 meq/L (ref 3.5–5.1)
Sodium: 140 mEq/L (ref 135–145)
Total Bilirubin: 0.5 mg/dL (ref 0.2–1.2)
Total Protein: 6.3 g/dL (ref 6.0–8.3)

## 2017-11-01 LAB — URINALYSIS, ROUTINE W REFLEX MICROSCOPIC
BILIRUBIN URINE: NEGATIVE
Hgb urine dipstick: NEGATIVE
KETONES UR: NEGATIVE
LEUKOCYTES UA: NEGATIVE
Nitrite: NEGATIVE
PH: 6 (ref 5.0–8.0)
RBC / HPF: NONE SEEN (ref 0–?)
Specific Gravity, Urine: 1.02 (ref 1.000–1.030)
TOTAL PROTEIN, URINE-UPE24: NEGATIVE
URINE GLUCOSE: NEGATIVE
UROBILINOGEN UA: 0.2 (ref 0.0–1.0)

## 2017-11-01 LAB — LIPID PANEL
Cholesterol: 108 mg/dL (ref 0–200)
HDL: 55.9 mg/dL (ref >39.00)
LDL Cholesterol: 42 mg/dL (ref 0–99)
NONHDL: 51.72
TRIGLYCERIDES: 48 mg/dL (ref 0.0–149.0)
Total CHOL/HDL Ratio: 2
VLDL: 9.6 mg/dL (ref 0.0–40.0)

## 2017-11-01 LAB — TSH: TSH: 2.77 u[IU]/mL (ref 0.35–4.50)

## 2017-11-01 LAB — HEMOGLOBIN A1C: HEMOGLOBIN A1C: 5.9 % (ref 4.6–6.5)

## 2017-11-01 LAB — VITAMIN D 25 HYDROXY (VIT D DEFICIENCY, FRACTURES): VITD: 28.47 ng/mL — AB (ref 30.00–100.00)

## 2017-11-01 LAB — T4, FREE: FREE T4: 0.89 ng/dL (ref 0.60–1.60)

## 2017-11-02 NOTE — Progress Notes (Signed)
Subjective:      Patient ID: Benjamin Hahn, male   DOB: 1933/06/02, 84 y.o.   MRN: [id number]  Chief complaint: Follow-up of various problems  HPI      HYPOTHYROIDISM: This is long-standing and he is usually on a stable dose since 2014 Currently taking 88 g which has been continued for some time His TSH was higher at one-point and this was because of his taking the levothyroxine after eating in the morning instead of before Did not complain of any unusual fatigue recently TSH is normal again   Lab Results  Component Value Date   TSH 2.77 11/01/2017   TSH 5.73 (H) 03/03/2017   TSH 2.91 11/04/2016   FREET4 0.89 11/01/2017   FREET4 0.91 03/03/2017   FREET4 0.96 11/04/2016    HYPOGONADISM: This has been managed by her urologist for the last few years However he had been on testosterone injection for some time and now his cardiologist does not want him to take these because of cardiovascular risk He has had testosterone levels that have been undetectable without treatment and he has significant fatigue and lethargy without treatment At some point he was taking custom-made cream from the urologist when he was not able to get his Testopel while on Plavix However Testopel is apparently too expensive for him Not sure what his last testosterone level was, no records available for the last 2 years from urologist  However has not had abnormally high levels on testosterone injections  He is getting injections every 2 weeks  Lab Results  Component Value Date   TESTOSTERONE 361.35 03/03/2017   TESTOSTERONE 197.11 (L) 07/02/2016   TESTOSTERONE 433.39 03/16/2014   TESTOSTERONE 0.00 Repeated and verified X2. (L) 10/31/2013       PREDIABETES  His fasting glucose has been normal more recently on the on this lab work he had oatmeal before his labs and glucose 110 A1c is in the prediabetic range although as low as 5.7   A1c is 5.9, previously 6.1 He has a grandmother with  diabetes  He has been able to keep his weight about the same Only recently has started going back to the [facility] for exercise and tries to walk otherwise also Usually watching portions  Wt Readings from Last 3 Encounters:  11/03/17 187 lb (84.8 kg)  10/29/17 186 lb (84.4 kg)  10/05/17 187 lb (84.8 kg)   Lab Results  Component Value Date   HGBA1C 5.9 11/01/2017   HGBA1C 5.7 06/30/2017   HGBA1C 6.1 06/01/2013   Lab Results  Component Value Date   LDLCALC 42 11/01/2017   CREATININE 1.48 11/01/2017     DEPRESSION: Treated by psychiatrist. Well controlled with Wellbutrin ER,  taking 150 mg dosage; also taking imipramine 10 mg at night to help sleep.  Has been on imipramine for several years Has been also on Effexor  Needs Xanax for anxiety as needed   LIPIDS: See review of systems  Allergies as of 11/03/2017   No Known Allergies     Medication List        Accurate as of 11/03/17  9:01 AM. Always use your most recent med list.          acetaminophen 500 MG tablet Commonly known as:  TYLENOL Take 500 mg by mouth daily as needed (pain).   albuterol 108 (90 Base) MCG/ACT inhaler Commonly known as:  PROVENTIL HFA;VENTOLIN HFA Inhale 2 puffs into the lungs every 6 (six) hours as needed for wheezing or  shortness of breath.   ALPRAZolam 0.5 MG tablet Commonly known as:  XANAX 1  qhs 1  q day PRN   aspirin 81 MG chewable tablet Chew 1 tablet (81 mg total) by mouth daily.   atorvastatin 40 MG tablet Commonly known as:  LIPITOR Take 1 tablet (40 mg total) by mouth daily at 6 PM.   benzonatate 200 MG capsule Commonly known as:  TESSALON Take 1 capsule (200 mg total) by mouth 2 (two) times daily as needed for cough.   buPROPion 150 MG 24 hr tablet Commonly known as:  WELLBUTRIN XL Take 1 tablet (150 mg total) every morning by mouth.   clobetasol cream 0.05 % Commonly known as:  TEMOVATE APPLY AS DIRECTED.   clopidogrel 75 MG tablet Commonly known as:   PLAVIX Take 1 tablet (75 mg total) by mouth daily with breakfast.   hydrOXYzine 10 MG tablet Commonly known as:  ATARAX/VISTARIL TAKE 1 TO 3 TABLETS EVERY 4 TO 6 HOURS AS NEEDED FOR ITCHING. *MAY CAUSE DROWSINESS*   imipramine 10 MG tablet Commonly known as:  TOFRANIL Take 1 tablet (10 mg total) at bedtime by mouth.   levothyroxine 88 MCG tablet Commonly known as:  SYNTHROID, LEVOTHROID TAKE 1 TABLET BY MOUTH DAILY.   metoprolol succinate 25 MG 24 hr tablet Commonly known as:  TOPROL-XL TAKE (1/2) TABLET DAILY.   mupirocin ointment 2 % Commonly known as:  BACTROBAN Apply 1 application topically 2 (two) times daily.   nitroGLYCERIN 0.4 MG SL tablet Commonly known as:  NITROSTAT Place 1 tablet (0.4 mg total) under the tongue every 5 (five) minutes as needed for chest pain (CP or SOB).   ramipril 1.25 MG capsule Commonly known as:  ALTACE Take 1 capsule (1.25 mg total) by mouth daily.   tamsulosin 0.4 MG Caps capsule Commonly known as:  FLOMAX TAKE (1) CAPSULE DAILY.   venlafaxine XR 75 MG 24 hr capsule Commonly known as:  EFFEXOR XR Take 2 capsules (150 mg total) by mouth daily.       Review of Systems    Hypercholesterolemia: This has been long-standing Since his MI he has been on 40 mg Lipitor from his cardiologist, previously on 10 mg LDL is below 70 again   Lab Results  Component Value Date   CHOL 108 11/01/2017   HDL 55.90 11/01/2017   LDLCALC 42 11/01/2017   TRIG 48.0 11/01/2017   CHOLHDL 2 11/01/2017      BPH: He  takes 0.8 mg of Flomax from his urologist with fairly good control of nocturia and frequency, he was told that he has inadequate emptying of the bladder also  No recent vertigo but if he is bending forward he may over balance  HYPERTENSION: Not present, only on minimal doses of ramipril for cardiac reasons  VITAMIN D deficiency: He has not been taking any supplements and his level is below 30 now     Objective:   Physical Exam BP  120/80 (BP Location: Left Arm, Patient Position: Sitting, Cuff Size: Normal)   Pulse 69   Ht 5' 7.5" (1.715 m)   Wt 187 lb (84.8 kg)   SpO2 96%   BMI 28.86 kg/m   He has minimal areas of scattered erythema/macular redness without any papules or significant rash on his back Wound on the lower shin area is clean and healing partly without surrounding redness   Assessment:      Hypogonadism: Adequately treated with injections in the past done by urologist  but his cardiologist wants to stop these and did not suggest an alternative.  However he has very long-standing severe hypogonadism and is symptomatic without any treatment.  Without testosterone his levels are undetectable Discussed that transdermal testosterone preparations will keep his level more evenly controlled and he is willing to try AndroGel again, discussed how this works and how the application process will be done.  Will start with 2 pumps on each arm and will need to do any paperwork for prior authorization if needed Repeat levels will be done in 6 weeks      Impaired fasting glucose: His blood sugar was not checked fasting but his A1c is still in the prediabetic range at 5.9 although has been as high as 6.1 He has been able to keep his weight down and starting exercise now He will have fasting labs on the next visit   ITCHING: He has had long-standing pruritus on his trunk and etiology unclear.  May be contact dermatitis and asked him to change his detergent and soap  Hypothyroidism: Has been able to control hypothyroid symptoms with 88 g of levothyroxine.  He is taking his before breakfast and his TSH is back to normal, to continue same dose     Depression, anxiety and chronic insomnia: Well controlled, continue follow-up with psychiatrist  Hypercholesterolemia on treatment: Adequately controlled with 40 mg   Leg ulcer: This is healing now, reminded him to apply the Bactroban and apply dressings every 12  hours   Vitamin D deficiency: He is not on any supplement and his level is below 30    Plan:      As above  Follow-up in 6 weeks for physical and fasting labs  Consistent exercise  Vitamin D3, 2000 units daily    Avarey Yaeger      total visit time for evaluation and management of multiple problems and counseling =25 minutes

## 2017-11-03 ENCOUNTER — Ambulatory Visit (INDEPENDENT_AMBULATORY_CARE_PROVIDER_SITE_OTHER): Payer: Medicare Other | Admitting: Endocrinology

## 2017-11-03 ENCOUNTER — Encounter: Payer: Self-pay | Admitting: Endocrinology

## 2017-11-03 VITALS — BP 120/80 | HR 69 | Ht 67.5 in | Wt 187.0 lb

## 2017-11-03 DIAGNOSIS — R7303 Prediabetes: Secondary | ICD-10-CM

## 2017-11-03 DIAGNOSIS — E063 Autoimmune thyroiditis: Secondary | ICD-10-CM | POA: Diagnosis not present

## 2017-11-03 DIAGNOSIS — E7800 Pure hypercholesterolemia, unspecified: Secondary | ICD-10-CM

## 2017-11-03 DIAGNOSIS — L299 Pruritus, unspecified: Secondary | ICD-10-CM

## 2017-11-03 DIAGNOSIS — E559 Vitamin D deficiency, unspecified: Secondary | ICD-10-CM | POA: Diagnosis not present

## 2017-11-03 DIAGNOSIS — E291 Testicular hypofunction: Secondary | ICD-10-CM

## 2017-11-03 MED ORDER — TESTOSTERONE 20.25 MG/ACT (1.62%) TD GEL: TRANSDERMAL | 1 refills | Status: DC

## 2017-11-03 MED ORDER — HYDROXYZINE HCL 10 MG PO TABS: ORAL_TABLET | ORAL | 0 refills | Status: DC

## 2017-11-03 NOTE — Patient Instructions (Addendum)
Vitamin D3, 2000 units daily  Take Dove soap and low allergenic detergent

## 2017-11-09 ENCOUNTER — Telehealth: Payer: Self-pay

## 2017-11-09 ENCOUNTER — Ambulatory Visit (INDEPENDENT_AMBULATORY_CARE_PROVIDER_SITE_OTHER): Payer: Medicare Other | Admitting: Endocrinology

## 2017-11-09 ENCOUNTER — Encounter: Payer: Self-pay | Admitting: Endocrinology

## 2017-11-09 VITALS — BP 100/58 | HR 74

## 2017-11-09 DIAGNOSIS — L03115 Cellulitis of right lower limb: Secondary | ICD-10-CM | POA: Diagnosis not present

## 2017-11-09 MED ORDER — DOXYCYCLINE HYCLATE 100 MG PO TABS: 100.0000 mg | ORAL_TABLET | Freq: Two times a day (BID) | ORAL | 0 refills | Status: DC

## 2017-11-09 NOTE — Telephone Encounter (Signed)
Patient called feels infection is getting worse and would like to see you this afternoon- red is spreading and it is swollen

## 2017-11-09 NOTE — Telephone Encounter (Signed)
He is to make appointment today

## 2017-11-09 NOTE — Progress Notes (Signed)
Patient ID: Benjamin Hahn, male   DOB: 12/26/1932, 84 y.o.   MRN: [id number]          Chief complaint: Worsening pain on the leg ulcer  History of Present Illness:  Although he has been applying the Bactroban ointment to his leg ulcer over the last 10 days he thinks it is getting worse now His exam was improved on 3/13  He came in today because he was having increasing redness and shooting pains in the lower leg He thinks he has been applying the Bactroban and using this twice a day with a Band-Aid     Allergies as of 11/09/2017   No Known Allergies     Medication List        Accurate as of 11/09/17  1:45 PM. Always use your most recent med list.          acetaminophen 500 MG tablet Commonly known as:  TYLENOL Take 500 mg by mouth daily as needed (pain).   albuterol 108 (90 Base) MCG/ACT inhaler Commonly known as:  PROVENTIL HFA;VENTOLIN HFA Inhale 2 puffs into the lungs every 6 (six) hours as needed for wheezing or shortness of breath.   ALPRAZolam 0.5 MG tablet Commonly known as:  XANAX 1  qhs 1  q day PRN   aspirin 81 MG chewable tablet Chew 1 tablet (81 mg total) by mouth daily.   atorvastatin 40 MG tablet Commonly known as:  LIPITOR Take 1 tablet (40 mg total) by mouth daily at 6 PM.   benzonatate 200 MG capsule Commonly known as:  TESSALON Take 1 capsule (200 mg total) by mouth 2 (two) times daily as needed for cough.   buPROPion 150 MG 24 hr tablet Commonly known as:  WELLBUTRIN XL Take 1 tablet (150 mg total) every morning by mouth.   clobetasol cream 0.05 % Commonly known as:  TEMOVATE APPLY AS DIRECTED.   clopidogrel 75 MG tablet Commonly known as:  PLAVIX Take 1 tablet (75 mg total) by mouth daily with breakfast.   hydrOXYzine 10 MG tablet Commonly known as:  ATARAX/VISTARIL Take 1 daily  For itching   imipramine 10 MG tablet Commonly known as:  TOFRANIL Take 1 tablet (10 mg total) at bedtime by mouth.   levothyroxine 88 MCG  tablet Commonly known as:  SYNTHROID, LEVOTHROID TAKE 1 TABLET BY MOUTH DAILY.   metoprolol succinate 25 MG 24 hr tablet Commonly known as:  TOPROL-XL TAKE (1/2) TABLET DAILY.   mupirocin ointment 2 % Commonly known as:  BACTROBAN Apply 1 application topically 2 (two) times daily.   nitroGLYCERIN 0.4 MG SL tablet Commonly known as:  NITROSTAT Place 1 tablet (0.4 mg total) under the tongue every 5 (five) minutes as needed for chest pain (CP or SOB).   ramipril 1.25 MG capsule Commonly known as:  ALTACE Take 1 capsule (1.25 mg total) by mouth daily.   tamsulosin 0.4 MG Caps capsule Commonly known as:  FLOMAX TAKE (1) CAPSULE DAILY.   Testosterone 20.25 MG/ACT (1.62%) Gel Apply 2 pumps to each arm once a day   venlafaxine XR 75 MG 24 hr capsule Commonly known as:  EFFEXOR XR Take 2 capsules (150 mg total) by mouth daily.       Allergies: No Known Allergies  Past Medical History:  Diagnosis Date  . Anxiety   . Arthritis    might be in back, no problems  . BPH (benign prostatic hyperplasia)   . Depression 1990s  . GERD (gastroesophageal  reflux disease)    occasional  . Hypercholesteremia    controled  . Hypothyroid   . Shingles May 2014   "Right face, still has some"  . Temporal arteritis ([place])   . Transient blindness of both eyes     Past Surgical History:  Procedure Laterality Date  . ARTERY BIOPSY Right 06/20/2013   Procedure: BIOPSY TEMPORAL ARTERY RIGHT;  Surgeon: Earnstine Regal, MD;  Location: [facility];  Service: General;  Laterality: Right;  . CARDIAC CATHETERIZATION N/A 09/02/2015   Procedure: Left Heart Cath and Coronary Angiography;  Surgeon: Charolette Forward, MD;  Location: [place] [facility];  Service: Cardiovascular;  Laterality: N/A;  . CARDIAC CATHETERIZATION N/A 09/02/2015   Procedure: Coronary Stent Intervention;  Surgeon: Charolette Forward, MD;  Location: [facility];  Service: Cardiovascular;  Laterality: N/A;  1.  mid RCA      (3.0/28mm  Xience) 2.  Mid LAD      (3.0/23mm Xience)  . NO PAST SURGERIES    . None      Family History  Problem Relation Age of Onset  . Stroke Father        Died, 80s  . Stroke Sister        Living, 44  . Heart disease Mother        Died, 46  . Healthy Daughter   . Diabetes Maternal Grandmother   . Hypertension Neg Hx     Social History:  reports that he quit smoking about 51 years ago. His smoking use included cigarettes. he has never used smokeless tobacco. He reports that he drinks about 1.2 oz of alcohol per week. He reports that he does not use drugs.   No visits with results within 1 Week(s) from this visit.  Latest known visit with results is:  Appointment on 11/01/2017  Component Date Value Ref Range Status  . VITD 11/01/2017 28.47* 30.00 - 100.00 ng/mL Final  . Free T4 11/01/2017 0.89  0.60 - 1.60 ng/dL Final   Comment: Specimens from patients who are undergoing biotin therapy and /or ingesting biotin supplements may contain high levels of biotin.  The higher biotin concentration in these specimens interferes with this Free T4 assay.  Specimens that contain high levels  of biotin may cause false high results for this Free T4 assay.  Please interpret results in light of the total clinical presentation of the patient.    [name] TSH 11/01/2017 2.77  0.35 - 4.50 uIU/mL Final  . Cholesterol 11/01/2017 108  0 - 200 mg/dL Final   ATP III Classification       Desirable:  < 200 mg/dL               Borderline High:  200 - 239 mg/dL          High:  > = 240 mg/dL  . Triglycerides 11/01/2017 48.0  0.0 - 149.0 mg/dL Final   Normal:  <150 mg/dLBorderline High:  150 - 199 mg/dL  . HDL 11/01/2017 55.90  >39.00 mg/dL Final  . VLDL 11/01/2017 9.6  0.0 - 40.0 mg/dL Final  . LDL Cholesterol 11/01/2017 42  0 - 99 mg/dL Final  . Total CHOL/HDL Ratio 11/01/2017 2   Final                  Men          Women1/2 Average Risk     3.4          3.3Average  Risk          5.0          4.42X Average Risk           9.6          7.13X Average Risk          15.0          11.0                      . NonHDL 11/01/2017 51.72   Final   NOTE:  Non-HDL goal should be 30 mg/dL higher than patient's LDL goal (i.e. LDL goal of < 70 mg/dL, would have non-HDL goal of < 100 mg/dL)  . Sodium 11/01/2017 140  135 - 145 mEq/L Final  . Potassium 11/01/2017 4.4  3.5 - 5.1 mEq/L Final  . Chloride 11/01/2017 103  96 - 112 mEq/L Final  . CO2 11/01/2017 29  19 - 32 mEq/L Final  . Glucose, Bld 11/01/2017 110* 70 - 99 mg/dL Final  . BUN 11/01/2017 16  6 - 23 mg/dL Final  . Creatinine, Ser 11/01/2017 1.48  0.40 - 1.50 mg/dL Final  . Total Bilirubin 11/01/2017 0.5  0.2 - 1.2 mg/dL Final  . Alkaline Phosphatase 11/01/2017 48  39 - 117 U/L Final  . AST 11/01/2017 12  0 - 37 U/L Final  . ALT 11/01/2017 10  0 - 53 U/L Final  . Total Protein 11/01/2017 6.3  6.0 - 8.3 g/dL Final  . Albumin 11/01/2017 3.7  3.5 - 5.2 g/dL Final  . Calcium 11/01/2017 9.1  8.4 - 10.5 mg/dL Final  . GFR 11/01/2017 48.28* >60.00 mL/min Final  . Hgb A1c MFr Bld 11/01/2017 5.9  4.6 - 6.5 % Final   Glycemic Control Guidelines for People with Diabetes:Non Diabetic:  <6%Goal of Therapy: <7%Additional Action Suggested:  >8%   . Color, Urine 11/01/2017 YELLOW  Yellow;Lt. Yellow Final  . APPearance 11/01/2017 CLEAR  Clear Final  . Specific Gravity, Urine 11/01/2017 1.020  1.000 - 1.030 Final  . pH 11/01/2017 6.0  5.0 - 8.0 Final  . Total Protein, Urine 11/01/2017 NEGATIVE  Negative Final  . Urine Glucose 11/01/2017 NEGATIVE  Negative Final  . Ketones, ur 11/01/2017 NEGATIVE  Negative Final  . Bilirubin Urine 11/01/2017 NEGATIVE  Negative Final  . Hgb urine dipstick 11/01/2017 NEGATIVE  Negative Final  . Urobilinogen, UA 11/01/2017 0.2  0.0 - 1.0 Final  . Leukocytes, UA 11/01/2017 NEGATIVE  Negative Final  . Nitrite 11/01/2017 NEGATIVE  Negative Final  . WBC, UA 11/01/2017 0-2/hpf  0-2/hpf Final  . RBC / HPF 11/01/2017 none seen  0-2/hpf Final  . Squamous  Epithelial / LPF 11/01/2017 Rare(0-4/hpf)  Rare(0-4/hpf) Final     Physical Exam  BP (!) 100/58 (BP Location: Left Arm, Patient Position: Sitting, Cuff Size: Normal)   Pulse 74   SpO2 96%   He has a partially healed superficial ulcer on the right mid shin area without exudate He has mild redness surrounding this There is warmth surrounding the ulcerated area especially medially Right lower leg area appears swollen compared to the left  Assessment/Plan:   Skin tear partially healed but now associated with mild cellulitis and local discomfort and swelling Bactroban be applied twice a day as before Doxycycline twice a day for 7 days    Elayne Snare 11/09/2017, 1:45 PM

## 2017-11-09 NOTE — Telephone Encounter (Signed)
I put him in the 1:30 slot

## 2017-11-15 ENCOUNTER — Telehealth: Payer: Self-pay | Admitting: *Deleted

## 2017-11-15 NOTE — Telephone Encounter (Signed)
Testosterone 1.62% gel PA initiated via CoverMyMeds.

## 2017-11-16 ENCOUNTER — Telehealth: Payer: Self-pay

## 2017-11-16 NOTE — Telephone Encounter (Signed)
PA approved for testosterone and patient has been notified

## 2017-11-18 ENCOUNTER — Other Ambulatory Visit: Payer: Self-pay | Admitting: Endocrinology

## 2017-11-25 ENCOUNTER — Ambulatory Visit (INDEPENDENT_AMBULATORY_CARE_PROVIDER_SITE_OTHER): Payer: Medicare Other | Admitting: Podiatry

## 2017-11-25 ENCOUNTER — Encounter: Payer: Self-pay | Admitting: Podiatry

## 2017-11-25 VITALS — BP 116/66 | HR 64 | Resp 16

## 2017-11-25 DIAGNOSIS — L03119 Cellulitis of unspecified part of limb: Secondary | ICD-10-CM | POA: Diagnosis not present

## 2017-11-25 DIAGNOSIS — L02619 Cutaneous abscess of unspecified foot: Secondary | ICD-10-CM

## 2017-11-25 MED ORDER — CEPHALEXIN 500 MG PO CAPS
500.0000 mg | ORAL_CAPSULE | Freq: Three times a day (TID) | ORAL | 1 refills | Status: DC
Start: 2017-11-25 — End: 2018-01-06

## 2017-11-25 NOTE — Progress Notes (Signed)
Subjective:   Patient ID: Benjamin Hahn, male   DOB: 84 y.o.   MRN: [id number]   HPI Patient presents with area of redness and irritation in the left plantar medial heel that he does not remember specific injury 4.  Patient states is been hurting for a short period of time and it does throb at times and is not noted any acute drainage at this time.  Patient does not smoke likes to be active   Review of Systems  All other systems reviewed and are negative.       Objective:  Physical Exam  Constitutional: He appears well-developed and well-nourished.  Cardiovascular: Intact distal pulses.  Pulmonary/Chest: Effort normal.  Musculoskeletal: Normal range of motion.  Neurological: He is alert.  Skin: Skin is warm.  Nursing note and vitals reviewed.   Neurovascular status found to be intact muscle strength was adequate range of motion within normal limits with patient noted to have irritation of the left plantar medial heel with redness and soreness with palpation.  It is localized that I did not note any proximal edema erythema or drainage and he has several other keratotic lesions on both feet     Assessment:  Possibility that this could be some form of soft tissue abscess of a localized superficial nature versus other corn callus condition     Plan:  H&P and condition reviewed and today I anesthetized the area and then using sterile instrumentation I opened the area up given the small amount of very superficial drainage with no deep drainage or other pathology noted.  I flushed the area out applied sterile dressing with padding and placed on cephalexin 500 mg twice daily and instructed on soaks and padding usage.  Should heal uneventfully but gave strict instructions if any issues were to occur to let us know immediately and I did debride the remaining lesions

## 2017-12-01 ENCOUNTER — Ambulatory Visit (INDEPENDENT_AMBULATORY_CARE_PROVIDER_SITE_OTHER): Payer: Medicare Other | Admitting: Psychiatry

## 2017-12-01 DIAGNOSIS — F320 Major depressive disorder, single episode, mild: Secondary | ICD-10-CM

## 2017-12-01 DIAGNOSIS — Z87891 Personal history of nicotine dependence: Secondary | ICD-10-CM | POA: Diagnosis not present

## 2017-12-01 MED ORDER — BUPROPION HCL ER (XL) 150 MG PO TB24: 150.0000 mg | ORAL_TABLET | ORAL | 6 refills | Status: DC

## 2017-12-01 MED ORDER — LORAZEPAM 0.5 MG PO TABS: ORAL_TABLET | ORAL | 2 refills | Status: DC

## 2017-12-01 NOTE — Progress Notes (Signed)
Patient ID: Benjamin Hahn, male   DOB: May 13, 1933, 84 y.o.   MRN: [id number] [facility] MD/PA/NP OP Progress Note  12/01/2017 4:24 PM Benjamin Hahn  MRN:  [id number]  Chief Complaint:  Subjective: Doing great Diagnosis : Major depression chronic, recurrent Today the patient is doing fairly well. His problem this is alcohol has increased. He was thinking about 2 drinks 4 or5. Today reviewed the dangers of drinking with antidepressants generally well. He's agreedto cut down his alcohol history drinks. Is also waking up over and has taken extra Xanax. I shared with Him that problem was drinking that much alcohol and is dropping down overnight and coming off his waking him up. So I believe his alcohol use at night which is extra more than usual is causing his problems with sleep. For now we'll discontinue his Xanax and begin him on 0.5 mg of Ativan every night. He continue Xanax on a rare when necessary basis but take Ativan at night for sleep continue low-dose Wellbutrin morning. The patient is agreed to try this to return to see me in one month.Overall the patient is eating well and is very active. 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 Past Medical History:  Diagnosis Date  . Anxiety   . Arthritis    might be in back, no problems  . BPH (benign prostatic hyperplasia)   . Depression 1990s  . GERD (gastroesophageal reflux disease)    occasional  . Hypercholesteremia    controled  . Hypothyroid   . Shingles May 2014   "Right face, still has some"  . Temporal arteritis ([place])   . Transient blindness of both eyes     Past Surgical History:  Procedure Laterality Date  . ARTERY BIOPSY Right 06/20/2013   Procedure: BIOPSY TEMPORAL ARTERY RIGHT;  Surgeon: Earnstine Regal, MD;  [facility];  Service: General;  Laterality: Right;  . CARDIAC CATHETERIZATION N/A 09/02/2015   Procedure: Left Heart Cath and Coronary Angiography;  Surgeon: Charolette Forward, MD;  Location: [place] [facility];   Service: Cardiovascular;  Laterality: N/A;  . CARDIAC CATHETERIZATION N/A 09/02/2015   Procedure: Coronary Stent Intervention;  Surgeon: Charolette Forward, MD;  Location: [place] [facility];  Service: Cardiovascular;  Laterality: N/A;  1.  mid RCA      (3.0/28mm Xience) 2.  Mid LAD      (3.0/23mm Xience)  . NO PAST SURGERIES    . None      Family Psychiatric History:   Family History:  Family History  Problem Relation Age of Onset  . Stroke Father        Died, 80s  . Stroke Sister        Living, 24  . Heart disease Mother        Died, 40  . Healthy Daughter   . Diabetes Maternal Grandmother   . Hypertension Neg Hx     Social History:  Social History   Socioeconomic History  . Marital status: Married    Spouse name: Not on file  . Number of children: Not on file  . Years of education: Not on file  . Highest education level: Not on file  Occupational History  . Not on file  Social Needs  . Financial resource strain: Not on file  . Food insecurity:    Worry: Not on file    Inability: Not on file  . Transportation needs:    Medical: Not on file  Non-medical: Not on file  Tobacco Use  . Smoking status: Former Smoker    Types: Cigarettes    Last attempt to quit: 08/24/1966    Years since quitting: 51.3  . Smokeless tobacco: Never Used  Substance and Sexual Activity  . Alcohol use: Yes    Alcohol/week: 1.2 oz    Types: 2 Shots of liquor per week    Comment: socially  . Drug use: No  . Sexual activity: Not on file  Lifestyle  . Physical activity:    Days per week: Not on file    Minutes per session: Not on file  . Stress: Not on file  Relationships  . Social connections:    Talks on phone: Not on file    Gets together: Not on file    Attends religious service: Not on file    Active member of club or organization: Not on file    Attends meetings of clubs or organizations: Not on file    Relationship status: Not on file  Other Topics Concern  . Not on file   Social History Narrative   Currently works as a Midwife.  Lives with wife and they have two healthy daughters.    Allergies: No Known Allergies  Metabolic Disorder Labs: Lab Results  Component Value Date   HGBA1C 5.9 11/01/2017   No results found for: PROLACTIN Lab Results  Component Value Date   CHOL 108 11/01/2017   TRIG 48.0 11/01/2017   HDL 55.90 11/01/2017   CHOLHDL 2 11/01/2017   VLDL 9.6 11/01/2017   LDLCALC 42 11/01/2017   LDLCALC 57 03/03/2017     Current Medications: Current Outpatient Medications  Medication Sig Dispense Refill  . acetaminophen (TYLENOL) 500 MG tablet Take 500 mg by mouth daily as needed (pain).    [name] albuterol (PROVENTIL HFA;VENTOLIN HFA) 108 (90 Base) MCG/ACT inhaler Inhale 2 puffs into the lungs every 6 (six) hours as needed for wheezing or shortness of breath. 1 Inhaler 0  . ALPRAZolam (XANAX) 0.5 MG tablet 1  qhs 1  q day PRN 45 tablet 5  . aspirin 81 MG chewable tablet Chew 1 tablet (81 mg total) by mouth daily. 30 tablet 3  . atorvastatin (LIPITOR) 40 MG tablet Take 1 tablet (40 mg total) by mouth daily at 6 PM. 30 tablet 3  . benzonatate (TESSALON) 200 MG capsule Take 1 capsule (200 mg total) by mouth 2 (two) times daily as needed for cough. 14 capsule 0  . buPROPion (WELLBUTRIN XL) 150 MG 24 hr tablet Take 1 tablet (150 mg total) by mouth every morning. 30 tablet 6  . cephALEXin (KEFLEX) 500 MG capsule Take 1 capsule (500 mg total) by mouth 3 (three) times daily. 30 capsule 1  . clobetasol cream (TEMOVATE) 0.05 % APPLY AS DIRECTED. 30 g 0  . clopidogrel (PLAVIX) 75 MG tablet Take 1 tablet (75 mg total) by mouth daily with breakfast. 30 tablet 3  . doxycycline (VIBRA-TABS) 100 MG tablet Take 1 tablet (100 mg total) by mouth 2 (two) times daily. 14 tablet 0  . hydrOXYzine (ATARAX/VISTARIL) 10 MG tablet Take 1 daily  For itching 30 tablet 0  . imipramine (TOFRANIL) 10 MG tablet Take 1 tablet (10 mg total) at bedtime by mouth. 30 tablet 5   . levothyroxine (SYNTHROID, LEVOTHROID) 88 MCG tablet TAKE 1 TABLET BY MOUTH DAILY. 30 tablet 0  . LORazepam (ATIVAN) 0.5 MG tablet 1  qhs 30 tablet 2  . metoprolol succinate (TOPROL-XL)  25 MG 24 hr tablet TAKE (1/2) TABLET DAILY. 30 tablet 0  . mupirocin ointment (BACTROBAN) 2 % Apply 1 application topically 2 (two) times daily. 22 g 0  . nitroGLYCERIN (NITROSTAT) 0.4 MG SL tablet Place 1 tablet (0.4 mg total) under the tongue every 5 (five) minutes as needed for chest pain (CP or SOB). 25 tablet 12  . ramipril (ALTACE) 1.25 MG capsule Take 1 capsule (1.25 mg total) by mouth daily. 30 capsule 3  . tamsulosin (FLOMAX) 0.4 MG CAPS capsule TAKE (1) CAPSULE DAILY. (Patient taking differently: TAKE (2) CAPSULES  DAILY AFTER SUPPER) 30 capsule 3  . Testosterone 20.25 MG/ACT (1.62%) GEL Apply 2 pumps to each arm once a day 150 g 1  . venlafaxine XR (EFFEXOR XR) 75 MG 24 hr capsule Take 2 capsules (150 mg total) by mouth daily. 30 capsule 5   No current facility-administered medications for this visit.     Neurologic: Headache: No Seizure: No Paresthesias: No  Musculoskeletal: Strength & Muscle Tone: within normal limits Gait & Station: normal Patient leans: NA  Psychiatric Specialty Exam: ROS  There were no vitals taken for this visit.There is no height or weight on file to calculate BMI.  General Appearance: Fairly Groomed  Eye Contact:  Good  Speech:  Clear and Coherent  Volume:  Normal  Mood:  Euthymic  Affect:  Appropriate  Thought Process:  Coherent  Orientation:  Full (Time, Place, and Person)  Thought Content:  WDL  Suicidal Thoughts:  No  Homicidal Thoughts:  No  Memory:  NA  Judgement:  Good  Insight:  Good  Psychomotor Activity:  Normal  Concentration:  Good  Recall:  Good  Fund of Knowledge: Good  Language: Good  Akathisia:  No  Handed:  Right  AIMS (if indicated):    Assets:  Desire for Improvement  ADL's:  Intact  Cognition: WNL  Sleep:       Treatment  Plan Summary:At this time the patient is doing fairly well. We had a long discussion of his medications. I shared with him that I would rather not be on imipramine for long-term basis given its anticholinergic effects area on the other hand he denies sedation constipation or blurred vision. For now we'll continue it but we will make some changes. We will start him back on Effexor at a lower dose of 75 mg XR and changes Wellbutrin from 100 mg slow release to dose of 150 mg XL. He'll take Effexor and Wellbutrin in the morning. He'll continue taking Xanax 0.5 mg at night which helps him sleep. For now he'll continue low-dose imipramine 10 mg at night. At the incident significant dose and probably is no reason to make any changes with it. Today the patient did not come in with his wife. Is getting along well with her and seems to be a nonissue. The patient is doing well. He denies any chest pain these days. He has no shortness of breath. He is in pretty good shape. He denies any neurological symptoms.   Jerral Ralph, MD 12/01/2017, 4:24 PM [facility] MD Progress Note  12/01/2017 4:24 PM Benjamin Hahn  MRN:  [id number] Subjective:  Feels well Principal Problem: Major depression, chronic, mild/residual Diagnosis:  Major depression, recurrent residual Today the patient is doing fairly well. He's been having some medical problems. He recently went for Thanksgiving to a length but unfortunately had a urinary tract infection. He also since then has been coughing and recently started on antibiotic.  Emotionally he is fairly stable. He denies daily depression. He stays very active at work. He's been unable to exercise. The patient is sleeping well and has an increase in his appetite. His energy is good. Is no problems thinking or concentrating. He is recently having a productive cough but is no shortness of breath. He denies any chest pain. Is a stable relationship with his wife. His kidneys are good. He has 4  grandchildren. Everybody in his home is good. He only issue is that a number mornings he awakens and he feels oversedated. It occurs point of his wife feels uncomfortable with him driving will taken to work. Today reviewed his medications and is evident that he likely needs reduction. He also notes that his memory has change in his and is good. 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 Patient Active Problem List   Diagnosis Date Noted  . Acute coronary syndrome ([place]) [I24.9] 08/30/2015  . Temporal arteritis ([place]) [M31.6] 06/16/2013  . Amaurosis fugax [G45.3] 06/01/2013  . Unspecified hypothyroidism [E03.9] 03/14/2013  . Other and unspecified hyperlipidemia [E78.5] 03/14/2013  . BPH (benign prostatic hypertrophy) [N40.0] 03/14/2013  . Hypogonadism, male [E29.1] 03/14/2013  . Erectile dysfunction [N52.9] 03/14/2013   Total Time spent with patient: 30 minutes  Past Psychiatric History:   Past Medical History:  Past Medical History:  Diagnosis Date  . Anxiety   . Arthritis    might be in back, no problems  . BPH (benign prostatic hyperplasia)   . Depression 1990s  . GERD (gastroesophageal reflux disease)    occasional  . Hypercholesteremia    controled  . Hypothyroid   . Shingles May 2014   "Right face, still has some"  . Temporal arteritis ([place])   . Transient blindness of both eyes     Past Surgical History:  Procedure Laterality Date  . ARTERY BIOPSY Right 06/20/2013   Procedure: BIOPSY TEMPORAL ARTERY RIGHT;  Surgeon: Earnstine Regal, MD;  [facility]: [facility];  Service: General;  Laterality: Right;  . CARDIAC CATHETERIZATION N/A 09/02/2015   Procedure: Left Heart Cath and Coronary Angiography;  Surgeon: Charolette Forward, MD;  Location: [place] [facility];  Service: Cardiovascular;  Laterality: N/A;  . CARDIAC CATHETERIZATION N/A 09/02/2015   Procedure: Coronary Stent Intervention;  Surgeon: Charolette Forward, MD;  Location: [place] [facility]  LAB;  Service: Cardiovascular;  Laterality: N/A;  1.  mid RCA      (3.0/28mm Xience) 2.  Mid LAD      (3.0/23mm Xience)  . NO PAST SURGERIES    . None     Family History:  Family History  Problem Relation Age of Onset  . Stroke Father        Died, 80s  . Stroke Sister        Living, 81  . Heart disease Mother        Died, 37  . Healthy Daughter   . Diabetes Maternal Grandmother   . Hypertension Neg Hx    Family Psychiatric  History:  Social History:  Social History   Substance and Sexual Activity  Alcohol Use Yes  . Alcohol/week: 1.2 oz  . Types: 2 Shots of liquor per week   Comment: socially     Social History   Substance and Sexual Activity  Drug Use No    Social History   Socioeconomic History  . Marital status: Married    Spouse name: Not on file  .  Number of children: Not on file  . Years of education: Not on file  . Highest education level: Not on file  Occupational History  . Not on file  Social Needs  . Financial resource strain: Not on file  . Food insecurity:    Worry: Not on file    Inability: Not on file  . Transportation needs:    Medical: Not on file    Non-medical: Not on file  Tobacco Use  . Smoking status: Former Smoker    Types: Cigarettes    Last attempt to quit: 08/24/1966    Years since quitting: 51.3  . Smokeless tobacco: Never Used  Substance and Sexual Activity  . Alcohol use: Yes    Alcohol/week: 1.2 oz    Types: 2 Shots of liquor per week    Comment: socially  . Drug use: No  . Sexual activity: Not on file  Lifestyle  . Physical activity:    Days per week: Not on file    Minutes per session: Not on file  . Stress: Not on file  Relationships  . Social connections:    Talks on phone: Not on file    Gets together: Not on file    Attends religious service: Not on file    Active member of club or organization: Not on file    Attends meetings of clubs or organizations: Not on file    Relationship status: Not on file   Other Topics Concern  . Not on file  Social History Narrative   Currently works as a Midwife.  Lives with wife and they have two healthy daughters.   Additional Social History:                         Sleep: Good  Appetite:  Good  Current Medications: Current Outpatient Medications  Medication Sig Dispense Refill  . acetaminophen (TYLENOL) 500 MG tablet Take 500 mg by mouth daily as needed (pain).    [name] albuterol (PROVENTIL HFA;VENTOLIN HFA) 108 (90 Base) MCG/ACT inhaler Inhale 2 puffs into the lungs every 6 (six) hours as needed for wheezing or shortness of breath. 1 Inhaler 0  . ALPRAZolam (XANAX) 0.5 MG tablet 1  qhs 1  q day PRN 45 tablet 5  . aspirin 81 MG chewable tablet Chew 1 tablet (81 mg total) by mouth daily. 30 tablet 3  . atorvastatin (LIPITOR) 40 MG tablet Take 1 tablet (40 mg total) by mouth daily at 6 PM. 30 tablet 3  . benzonatate (TESSALON) 200 MG capsule Take 1 capsule (200 mg total) by mouth 2 (two) times daily as needed for cough. 14 capsule 0  . buPROPion (WELLBUTRIN XL) 150 MG 24 hr tablet Take 1 tablet (150 mg total) by mouth every morning. 30 tablet 6  . cephALEXin (KEFLEX) 500 MG capsule Take 1 capsule (500 mg total) by mouth 3 (three) times daily. 30 capsule 1  . clobetasol cream (TEMOVATE) 0.05 % APPLY AS DIRECTED. 30 g 0  . clopidogrel (PLAVIX) 75 MG tablet Take 1 tablet (75 mg total) by mouth daily with breakfast. 30 tablet 3  . doxycycline (VIBRA-TABS) 100 MG tablet Take 1 tablet (100 mg total) by mouth 2 (two) times daily. 14 tablet 0  . hydrOXYzine (ATARAX/VISTARIL) 10 MG tablet Take 1 daily  For itching 30 tablet 0  . imipramine (TOFRANIL) 10 MG tablet Take 1 tablet (10 mg total) at bedtime by mouth. 30 tablet 5  .  levothyroxine (SYNTHROID, LEVOTHROID) 88 MCG tablet TAKE 1 TABLET BY MOUTH DAILY. 30 tablet 0  . LORazepam (ATIVAN) 0.5 MG tablet 1  qhs 30 tablet 2  . metoprolol succinate (TOPROL-XL) 25 MG 24 hr tablet TAKE (1/2) TABLET  DAILY. 30 tablet 0  . mupirocin ointment (BACTROBAN) 2 % Apply 1 application topically 2 (two) times daily. 22 g 0  . nitroGLYCERIN (NITROSTAT) 0.4 MG SL tablet Place 1 tablet (0.4 mg total) under the tongue every 5 (five) minutes as needed for chest pain (CP or SOB). 25 tablet 12  . ramipril (ALTACE) 1.25 MG capsule Take 1 capsule (1.25 mg total) by mouth daily. 30 capsule 3  . tamsulosin (FLOMAX) 0.4 MG CAPS capsule TAKE (1) CAPSULE DAILY. (Patient taking differently: TAKE (2) CAPSULES  DAILY AFTER SUPPER) 30 capsule 3  . Testosterone 20.25 MG/ACT (1.62%) GEL Apply 2 pumps to each arm once a day 150 g 1  . venlafaxine XR (EFFEXOR XR) 75 MG 24 hr capsule Take 2 capsules (150 mg total) by mouth daily. 30 capsule 5   No current facility-administered medications for this visit.     Lab Results:  No results found for this or any previous visit (from the past 48 hour(s)).  Physical Findings: AIMS:  , ,  ,  ,    CIWA:    COWS:     Musculoskeletal: Strength & Muscle Tone: within normal limits Gait & Station: normal Patient leans: Right  Psychiatric Specialty Exam: ROS  There were no vitals taken for this visit.There is no height or weight on file to calculate BMI.  General Appearance: Negative  Eye Contact::  Good  Speech:  Clear and Coherent  Volume:  Normal  Mood:  NA  Affect:  Appropriate  Thought Process:  Coherent  Orientation:  Full (Time, Place, and Person)  Thought Content:  WDL  Suicidal Thoughts:  No  Homicidal Thoughts:  No  Memory:  NA  Judgement:  Good  Insight:  Good  Psychomotor Activity:  Normal  Concentration:  Good  Recall:  Good  Fund of Knowledge:Good  Language: Good  Akathisia:  No  Handed:  Right  AIMS (if indicated):     Assets:  Desire for Improvement  ADL's:  Intact  Cognition: WNL  Sleep:      Treatment Plan Summary:At this time the patient will continue taking imipramine 10 mg at night and Xanax 0.5 mg. One of his problems with sleep or these  medications she does well. Is not on it major problem is that of clinical depression. Again he has problems with compliance. She's taking [place] 150 mg XL all the complaints about extensive. He does state. For reasons that are not clear he stopped his Effexor. Today he is agreed to restart his Effexor 75 mg XL each morning. Taking Effexor with [place] morning and he takes Xanax and imipramine night. He'll return to see me in 3 months for an opportunity to have some supportive psychotherapy. Presently the patient is not in therapy. I do not think this patient is suicidal at this time. Physically he is very stable. [facility] MD Progress Note  12/01/2017 4:24 PM Benjamin Hahn  MRN:  [id number] Subjective:  Feels well Principal Problem: Major depression, chronic, mild/residual Diagnosis:  Major depression, recurrent residual Today the patient is doing fairly well. He's been having some medical problems. He recently went for Thanksgiving to a length but unfortunately had a urinary tract infection. He also since then has been coughing and  recently started on antibiotic. Emotionally he is fairly stable. He denies daily depression. He stays very active at work. He's been unable to exercise. The patient is sleeping well and has an increase in his appetite. His energy is good. Is no problems thinking or concentrating. He is recently having a productive cough but is no shortness of breath. He denies any chest pain. Is a stable relationship with his wife. His kidneys are good. He has 4 grandchildren. Everybody in his home is good. He only issue is that a number mornings he awakens and he feels oversedated. It occurs point of his wife feels uncomfortable with him driving will taken to work. Today reviewed his medications and is evident that he likely needs reduction. He also notes that his memory has change in his and is good. 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 But for now been  discontinued. Patient Active Problem List   Diagnosis Date Noted  . Acute coronary syndrome ([place]) [I24.9] 08/30/2015  . Temporal arteritis ([place]) [M31.6] 06/16/2013  . Amaurosis fugax [G45.3] 06/01/2013  . Unspecified hypothyroidism [E03.9] 03/14/2013  . Other and unspecified hyperlipidemia [E78.5] 03/14/2013  . BPH (benign prostatic hypertrophy) [N40.0] 03/14/2013  . Hypogonadism, male [E29.1] 03/14/2013  . Erectile dysfunction [N52.9] 03/14/2013   Total Time spent with patient: 30 minutes  Past Psychiatric History:   Past Medical History:  Past Medical History:  Diagnosis Date  . Anxiety   . Arthritis    might be in back, no problems  . BPH (benign prostatic hyperplasia)   . Depression 1990s  . GERD (gastroesophageal reflux disease)    occasional  . Hypercholesteremia    controled  . Hypothyroid   . Shingles May 2014   "Right face, still has some"  . Temporal arteritis ([place])   . Transient blindness of both eyes     Past Surgical History:  Procedure Laterality Date  . ARTERY BIOPSY Right 06/20/2013   Procedure: BIOPSY TEMPORAL ARTERY RIGHT;  Surgeon: Earnstine Regal, MD;  [facility];  Service: General;  Laterality: Right;  . CARDIAC CATHETERIZATION N/A 09/02/2015   Procedure: Left Heart Cath and Coronary Angiography;  Surgeon: Charolette Forward, MD;  Location: [place] [facility];  Service: Cardiovascular;  Laterality: N/A;  . CARDIAC CATHETERIZATION N/A 09/02/2015   Procedure: Coronary Stent Intervention;  Surgeon: Charolette Forward, MD;  Location: [place] [facility];  Service: Cardiovascular;  Laterality: N/A;  1.  mid RCA      (3.0/28mm Xience) 2.  Mid LAD      (3.0/23mm Xience)  . NO PAST SURGERIES    . None     Family History:  Family History  Problem Relation Age of Onset  . Stroke Father        Died, 80s  . Stroke Sister        Living, 43  . Heart disease Mother        Died, 27  . Healthy Daughter   . Diabetes Maternal Grandmother   . Hypertension Neg Hx     Family Psychiatric  History:  Social History:  Social History   Substance and Sexual Activity  Alcohol Use Yes  . Alcohol/week: 1.2 oz  . Types: 2 Shots of liquor per week   Comment: socially     Social History   Substance and Sexual Activity  Drug Use No    Social History   Socioeconomic History  . Marital status: Married    Spouse  name: Not on file  . Number of children: Not on file  . Years of education: Not on file  . Highest education level: Not on file  Occupational History  . Not on file  Social Needs  . Financial resource strain: Not on file  . Food insecurity:    Worry: Not on file    Inability: Not on file  . Transportation needs:    Medical: Not on file    Non-medical: Not on file  Tobacco Use  . Smoking status: Former Smoker    Types: Cigarettes    Last attempt to quit: 08/24/1966    Years since quitting: 51.3  . Smokeless tobacco: Never Used  Substance and Sexual Activity  . Alcohol use: Yes    Alcohol/week: 1.2 oz    Types: 2 Shots of liquor per week    Comment: socially  . Drug use: No  . Sexual activity: Not on file  Lifestyle  . Physical activity:    Days per week: Not on file    Minutes per session: Not on file  . Stress: Not on file  Relationships  . Social connections:    Talks on phone: Not on file    Gets together: Not on file    Attends religious service: Not on file    Active member of club or organization: Not on file    Attends meetings of clubs or organizations: Not on file    Relationship status: Not on file  Other Topics Concern  . Not on file  Social History Narrative   Currently works as a Midwife.  Lives with wife and they have two healthy daughters.   Additional Social History:                         Sleep: Good  Appetite:  Good  Current Medications: Current Outpatient Medications  Medication Sig Dispense Refill  . acetaminophen (TYLENOL) 500 MG tablet Take 500 mg by mouth daily as needed  (pain).    [name] albuterol (PROVENTIL HFA;VENTOLIN HFA) 108 (90 Base) MCG/ACT inhaler Inhale 2 puffs into the lungs every 6 (six) hours as needed for wheezing or shortness of breath. 1 Inhaler 0  . ALPRAZolam (XANAX) 0.5 MG tablet 1  qhs 1  q day PRN 45 tablet 5  . aspirin 81 MG chewable tablet Chew 1 tablet (81 mg total) by mouth daily. 30 tablet 3  . atorvastatin (LIPITOR) 40 MG tablet Take 1 tablet (40 mg total) by mouth daily at 6 PM. 30 tablet 3  . benzonatate (TESSALON) 200 MG capsule Take 1 capsule (200 mg total) by mouth 2 (two) times daily as needed for cough. 14 capsule 0  . buPROPion (WELLBUTRIN XL) 150 MG 24 hr tablet Take 1 tablet (150 mg total) by mouth every morning. 30 tablet 6  . cephALEXin (KEFLEX) 500 MG capsule Take 1 capsule (500 mg total) by mouth 3 (three) times daily. 30 capsule 1  . clobetasol cream (TEMOVATE) 0.05 % APPLY AS DIRECTED. 30 g 0  . clopidogrel (PLAVIX) 75 MG tablet Take 1 tablet (75 mg total) by mouth daily with breakfast. 30 tablet 3  . doxycycline (VIBRA-TABS) 100 MG tablet Take 1 tablet (100 mg total) by mouth 2 (two) times daily. 14 tablet 0  . hydrOXYzine (ATARAX/VISTARIL) 10 MG tablet Take 1 daily  For itching 30 tablet 0  . imipramine (TOFRANIL) 10 MG tablet Take 1 tablet (10 mg total) at bedtime  by mouth. 30 tablet 5  . levothyroxine (SYNTHROID, LEVOTHROID) 88 MCG tablet TAKE 1 TABLET BY MOUTH DAILY. 30 tablet 0  . LORazepam (ATIVAN) 0.5 MG tablet 1  qhs 30 tablet 2  . metoprolol succinate (TOPROL-XL) 25 MG 24 hr tablet TAKE (1/2) TABLET DAILY. 30 tablet 0  . mupirocin ointment (BACTROBAN) 2 % Apply 1 application topically 2 (two) times daily. 22 g 0  . nitroGLYCERIN (NITROSTAT) 0.4 MG SL tablet Place 1 tablet (0.4 mg total) under the tongue every 5 (five) minutes as needed for chest pain (CP or SOB). 25 tablet 12  . ramipril (ALTACE) 1.25 MG capsule Take 1 capsule (1.25 mg total) by mouth daily. 30 capsule 3  . tamsulosin (FLOMAX) 0.4 MG CAPS capsule  TAKE (1) CAPSULE DAILY. (Patient taking differently: TAKE (2) CAPSULES  DAILY AFTER SUPPER) 30 capsule 3  . Testosterone 20.25 MG/ACT (1.62%) GEL Apply 2 pumps to each arm once a day 150 g 1  . venlafaxine XR (EFFEXOR XR) 75 MG 24 hr capsule Take 2 capsules (150 mg total) by mouth daily. 30 capsule 5   No current facility-administered medications for this visit.     Lab Results:  No results found for this or any previous visit (from the past 48 hour(s)).  Physical Findings: AIMS:  , ,  ,  ,    CIWA:    COWS:     Musculoskeletal: Strength & Muscle Tone: within normal limits Gait & Station: normal Patient leans: Right  Psychiatric Specialty Exam: ROS  There were no vitals taken for this visit.There is no height or weight on file to calculate BMI.  General Appearance: Negative  Eye Contact::  Good  Speech:  Clear and Coherent  Volume:  Normal  Mood:  NA  Affect:  Appropriate  Thought Process:  Coherent  Orientation:  Full (Time, Place, and Person)  Thought Content:  WDL  Suicidal Thoughts:  No  Homicidal Thoughts:  No  Memory:  NA  Judgement:  Good  Insight:  Good  Psychomotor Activity:  Normal  Concentration:  Good  Recall:  Good  Fund of Knowledge:Good  Language: Good  Akathisia:  No  Handed:  Right  AIMS (if indicated):     Assets:  Desire for Improvement  ADL's:  Intact  Cognition: WNL  Sleep:      Treatment Plan Summary: Today we discussed the possibility of naltrexone. At this time is willing to try to cut down his alcohol from for breathing 5 drinks down to just taking one or 2 drinks.. I believe he's done this in the past.at this time we'll discontinue his Xanax and begin on Ativan 0.5 mg at night. He'll continue taking Wellbutrin as prescribed and return to see me in one month for a 30 minute visit. The patient is not depressed. His fears are good.His life are working well.

## 2017-12-15 ENCOUNTER — Other Ambulatory Visit: Payer: Self-pay | Admitting: Endocrinology

## 2017-12-16 NOTE — Telephone Encounter (Signed)
Approved on March 26 Effective from 11/15/2017 through 11/16/2018.

## 2017-12-20 ENCOUNTER — Other Ambulatory Visit: Payer: Self-pay | Admitting: Endocrinology

## 2017-12-31 DIAGNOSIS — H5212 Myopia, left eye: Secondary | ICD-10-CM | POA: Diagnosis not present

## 2017-12-31 DIAGNOSIS — H52223 Regular astigmatism, bilateral: Secondary | ICD-10-CM | POA: Diagnosis not present

## 2017-12-31 DIAGNOSIS — H25813 Combined forms of age-related cataract, bilateral: Secondary | ICD-10-CM | POA: Diagnosis not present

## 2018-01-05 ENCOUNTER — Other Ambulatory Visit (INDEPENDENT_AMBULATORY_CARE_PROVIDER_SITE_OTHER): Payer: Medicare Other

## 2018-01-05 DIAGNOSIS — E291 Testicular hypofunction: Secondary | ICD-10-CM

## 2018-01-05 DIAGNOSIS — R7303 Prediabetes: Secondary | ICD-10-CM

## 2018-01-05 LAB — CBC
HEMATOCRIT: 43.1 % (ref 39.0–52.0)
Hemoglobin: 14.5 g/dL (ref 13.0–17.0)
MCHC: 33.8 g/dL (ref 30.0–36.0)
MCV: 91.7 fl (ref 78.0–100.0)
Platelets: 201 10*3/uL (ref 150.0–400.0)
RBC: 4.7 Mil/uL (ref 4.22–5.81)
RDW: 14.4 % (ref 11.5–15.5)
WBC: 5.9 10*3/uL (ref 4.0–10.5)

## 2018-01-05 LAB — GLUCOSE, RANDOM: Glucose, Bld: 97 mg/dL (ref 70–99)

## 2018-01-05 LAB — TESTOSTERONE: TESTOSTERONE: 54.3 ng/dL — AB (ref 300.00–890.00)

## 2018-01-06 ENCOUNTER — Encounter: Payer: Self-pay | Admitting: Endocrinology

## 2018-01-06 ENCOUNTER — Other Ambulatory Visit: Payer: Self-pay

## 2018-01-06 ENCOUNTER — Ambulatory Visit (INDEPENDENT_AMBULATORY_CARE_PROVIDER_SITE_OTHER): Payer: Medicare Other | Admitting: Endocrinology

## 2018-01-06 VITALS — BP 118/60 | HR 60 | Ht 67.5 in | Wt 181.2 lb

## 2018-01-06 DIAGNOSIS — E291 Testicular hypofunction: Secondary | ICD-10-CM | POA: Diagnosis not present

## 2018-01-06 DIAGNOSIS — E559 Vitamin D deficiency, unspecified: Secondary | ICD-10-CM

## 2018-01-06 DIAGNOSIS — E7800 Pure hypercholesterolemia, unspecified: Secondary | ICD-10-CM | POA: Diagnosis not present

## 2018-01-06 DIAGNOSIS — F321 Major depressive disorder, single episode, moderate: Secondary | ICD-10-CM | POA: Diagnosis not present

## 2018-01-06 DIAGNOSIS — Z0000 Encounter for general adult medical examination without abnormal findings: Secondary | ICD-10-CM

## 2018-01-06 MED ORDER — VENLAFAXINE HCL ER 75 MG PO CP24: 75.0000 mg | ORAL_CAPSULE | Freq: Every day | ORAL | 1 refills | Status: DC

## 2018-01-06 MED ORDER — TESTOSTERONE CYPIONATE 200 MG/ML IM SOLN: 100.0000 mg | INTRAMUSCULAR | 1 refills | Status: DC

## 2018-01-06 MED ORDER — "SYRINGE 25G X 1-1/2"" 3 ML MISC": 1.0000 | 0 refills | Status: DC

## 2018-01-06 NOTE — Patient Instructions (Addendum)
Restart Venlafaxine  Vitamin D3, 2000 units daily  Testosterone gel 2 pumps on each

## 2018-01-06 NOTE — Progress Notes (Signed)
Patient ID: Benjamin Hahn, male   DOB: [birth year], 84 y.o.   MRN: [id number]  SUBJECTIVE:  Patient is being seen today for annual wellness visit, complete physical exam and review of chronic problems.   Risk factors: Advancing age, long-standing depression     Activities of Daily Living:  In the present state of health, the patient has no difficulty performing the following activities:  Preparing food and eating, Bathing, Getting dressed and taking care of daily personal  needs Patient is able to ambulate but does complain of feeling a little unsteady at times  In the past year the patient has not had any falls although he thinks he may fall if he tries to bend forward  Safety: Has smoke detector and wears seat belts. No excess sun exposure.  Diet, alcohol and Exercise  Current exercise habits: Intermittent, sometimes trying to exercise on the elliptical   Dietary issues discussed: heart healthy diet. Has  generally controlling high-fat and high carbohydrate intake Alcohol: About  1 drink daily  Depression Screen:  Results available in the depression screening section  Advance directives:  None available on chart and did not bring paperwork as directed before     Allergies as of 01/06/2018   No Known Allergies     Medication List        Accurate as of 01/06/18  8:36 AM. Always use your most recent med list.          acetaminophen 500 MG tablet Commonly known as:  TYLENOL Take 500 mg by mouth daily as needed (pain).   albuterol 108 (90 Base) MCG/ACT inhaler Commonly known as:  PROVENTIL HFA;VENTOLIN HFA Inhale 2 puffs into the lungs every 6 (six) hours as needed for wheezing or shortness of breath.   ALPRAZolam 0.5 MG tablet Commonly known as:  XANAX 1  qhs 1  q day PRN   aspirin 81 MG chewable tablet Chew 1 tablet (81 mg total) by mouth daily.   atorvastatin 40 MG tablet Commonly known as:  LIPITOR Take 1 tablet (40 mg total) by mouth  daily at 6 PM.   benzonatate 200 MG capsule Commonly known as:  TESSALON Take 1 capsule (200 mg total) by mouth 2 (two) times daily as needed for cough.   buPROPion 150 MG 24 hr tablet Commonly known as:  WELLBUTRIN XL Take 1 tablet (150 mg total) by mouth every morning.   clobetasol cream 0.05 % Commonly known as:  TEMOVATE APPLY AS DIRECTED.   clopidogrel 75 MG tablet Commonly known as:  PLAVIX Take 1 tablet (75 mg total) by mouth daily with breakfast.   doxycycline 100 MG tablet Commonly known as:  VIBRA-TABS Take 1 tablet (100 mg total) by mouth 2 (two) times daily.   hydrOXYzine 10 MG tablet Commonly known as:  ATARAX/VISTARIL Take 1 daily  For itching   hydrOXYzine 10 MG tablet Commonly known as:  ATARAX/VISTARIL TAKE 1 TABLET ONCE DAILY FOR ITCHING   imipramine 10 MG tablet Commonly known as:  TOFRANIL Take 1 tablet (10 mg total) at bedtime by mouth.   levothyroxine 88 MCG tablet Commonly known as:  SYNTHROID, LEVOTHROID TAKE 1 TABLET BY MOUTH DAILY.   LORazepam 0.5 MG tablet Commonly known as:  ATIVAN 1  qhs   metoprolol succinate 25 MG 24 hr tablet Commonly known as:  TOPROL-XL TAKE (1/2) TABLET DAILY.   mupirocin ointment 2 % Commonly known as:  BACTROBAN Apply 1 application topically 2 (two) times daily.  nitroGLYCERIN 0.4 MG SL tablet Commonly known as:  NITROSTAT Place 1 tablet (0.4 mg total) under the tongue every 5 (five) minutes as needed for chest pain (CP or SOB).   ramipril 1.25 MG capsule Commonly known as:  ALTACE Take 1 capsule (1.25 mg total) by mouth daily.   tamsulosin 0.4 MG Caps capsule Commonly known as:  FLOMAX TAKE (1) CAPSULE DAILY.   Testosterone 20.25 MG/ACT (1.62%) Gel Apply 2 pumps to each arm once a day   venlafaxine XR 75 MG 24 hr capsule Commonly known as:  EFFEXOR XR Take 2 capsules (150 mg total) by mouth daily.       Allergies: No Known Allergies  Past Medical History:  Diagnosis Date  . Anxiety   .  Arthritis    might be in back, no problems  . BPH (benign prostatic hyperplasia)   . Depression 1990s  . GERD (gastroesophageal reflux disease)    occasional  . Hypercholesteremia    controled  . Hypothyroid   . Shingles May 2014   "Right face, still has some"  . Temporal arteritis ([place])   . Transient blindness of both eyes     Past Surgical History:  Procedure Laterality Date  . ARTERY BIOPSY Right 06/20/2013   Procedure: BIOPSY TEMPORAL ARTERY RIGHT;  Surgeon: Earnstine Regal, MD;  Location: [facility];  Service: General;  Laterality: Right;  . CARDIAC CATHETERIZATION N/A 09/02/2015   Procedure: Left Heart Cath and Coronary Angiography;  Surgeon: Charolette Forward, MD;  Location: [facility];  Service: Cardiovascular;  Laterality: N/A;  . CARDIAC CATHETERIZATION N/A 09/02/2015   Procedure: Coronary Stent Intervention;  Surgeon: Charolette Forward, MD;  Location: [facility];  Service: Cardiovascular;  Laterality: N/A;  1.  mid RCA      (3.0/28mm Xience) 2.  Mid LAD      (3.0/23mm Xience)  . NO PAST SURGERIES    . None      Family History  Problem Relation Age of Onset  . Stroke Father        Died, 80s  . Stroke Sister        Living, 105  . Heart disease Mother        Died, 74  . Healthy Daughter   . Diabetes Maternal Grandmother   . Hypertension Neg Hx     Social History:  reports that he quit smoking about 51 years ago. His smoking use included cigarettes. He has never used smokeless tobacco. He reports that he drinks about 1.2 oz of alcohol per week. He reports that he does not use drugs.   Objective   BP 118/60 (BP Location: Left Arm, Patient Position: Sitting, Cuff Size: Normal)   Pulse 60   Ht 5' 7.5" (1.715 m)   Wt 181 lb 3.2 oz (82.2 kg)   SpO2 95%   BMI 27.96 kg/m     Assessment      Preventive care and evaluation done  Plan    Preventive services discussed and recommended:       Fall prevention   Diabetes screening  Nutrition  counseling Exercise regimen discussed and encouraged him to continue  Lipid screening Colorectal screening to be done with [facility].  May not be necessary to have colonoscopy at his age Prostate exam done today, PSA not indicated at his age Regular eye and dental exams recommended, needs to make appointment for regular eye exam   Vaccines / LABS Flu vaccine/Zostavax / Pneumococcal Vaccine/Prevnar up-to-date Tetanus  booster is up-to-date    Elayne Snare 01/06/18        COMPLETE annual exam and history:    Chief complaint:   Depression         This is a recurrent problem.  The current episode started more than 1 month ago.   The onset quality is gradual.   The problem occurs daily.The problem is unchanged.  Associated symptoms include fatigue, hopelessness, decreased interest and appetite change.  Associated symptoms include no headaches.    Wt Readings from Last 3 Encounters:  01/06/18 181 lb 3.2 oz (82.2 kg)  11/03/17 187 lb (84.8 kg)  10/29/17 186 lb (84.4 kg)   .    DEPRESSION: This has been long-standing Followed by psychiatrist Although he was recommended Effexor by the psychiatrist he has not taken this and is only taking Wellbutrin. He thinks he does feel depressed mood and feeling low frequently  INSOMNIA:   Has been able to sleep reasonably well with his usual regimen of imipramine and 1/2 Xanax   Hypothyroidism: This is long-standing and he is usually on a stable dose of 88 g.     Lab Results  Component Value Date   TSH 2.77 11/01/2017   TSH 5.73 (H) 03/03/2017   TSH 2.91 11/04/2016   FREET4 0.89 11/01/2017   FREET4 0.91 03/03/2017   FREET4 0.96 11/04/2016        HYPERCHOLESTEROLEMIA: Since his diagnosis of coronary artery disease he is on a high intensity statin using 40 mg Lipitor.   Previously has had long-standing hypercholesterolemia treated with low doses of statins including pravastatin.  LDL is below 70    Lab Results  Component Value  Date   CHOL 108 11/01/2017   HDL 55.90 11/01/2017   LDLCALC 42 11/01/2017   TRIG 48.0 11/01/2017   CHOLHDL 2 11/01/2017    Long-standing hypogonadism:  He has been treated by his urologist.  He has had various treatment regimens in the past He did have adequate levels on the Testopel  He had been taking testosterone injections twice a month given by the urologist since he could not get the Testopel pellet with being on Plavix  Which is being continued Also his insurance does not appear to be paying for this  His cardiologist told him testosterone injections were risky for his heart and he is not taking AndroGel He is supposed to be taking 2 pumps on each arm but he is only taking 1 pump on each arm He appears to have decreased appetite, weight loss, fatigue and increased depression recently Testosterone level is significantly lower  Lab Results  Component Value Date   TESTOSTERONE 54.30 (L) 01/05/2018      Medications, past history, family history and physical history: See above section  Review of Systems  Constitutional: Positive for weight loss, reduced appetite and malaise.  HENT: Negative for headaches.   Eyes: Negative for visual disturbance.  Respiratory: Negative for shortness of breath.   Cardiovascular: Negative for chest pain and leg swelling.  Gastrointestinal: Negative for constipation and abdominal pain.  Endocrine: Positive for fatigue and erectile dysfunction.  Genitourinary: Positive for nocturia.       He has had fair urinary stream with using Flomax. Has occasional nocturia.   Musculoskeletal: Negative for joint pain.  Skin: Negative for rash.  Neurological: Negative for weakness.       Recently has had only minor balance difficulties  Psychiatric/Behavioral: Positive for depressed mood.    Physical Exam  Constitutional:  He is oriented to person, place, and time.  HENT:  Mouth/Throat: Oropharynx is clear and moist.  Eyes: Conjunctivae are normal.   Neck: No thyromegaly present.  Cardiovascular: Normal rate, regular rhythm and normal heart sounds.  No murmur heard. Pulmonary/Chest: Breath sounds normal. He has no wheezes. He has no rales.  Abdominal: Soft. He exhibits no distension and no mass. There is no tenderness.  Genitourinary: Rectum normal.  Genitourinary Comments: Prostate moderately enlarged, slightly firm, bilateral  Musculoskeletal: He exhibits no edema.  Spine shows mild thoracic kyphosis, no tenderness  Lymphadenopathy:    He has no cervical adenopathy.  Neurological: He is alert and oriented to person, place, and time. He displays normal reflexes. No sensory deficit.  Appears to be decreased retention, asking same questions repeatedly  Skin: No rash noted. No pallor.  Psychiatric:  Relatively flat defect    ASSESSMENT/PLAN   Weight loss: He has had decreased appetite and weight loss likely to be from his depression.  Depression: He is still having increased depression and not taking Effexor as prescribed.   Prescription for the 75 mg  tablet of Effexor XR given and he will follow-up with the psychiatrist next month  HYPOGONADISM: He is not taking enough testosterone supplement but his level is extremely low and doubt if he can get therapeutic levels with testosterone gels which he has failed in the past.  Reassured him that if he takes weekly testosterone injection this will not increase his cardiovascular risk and will send a note to the cardiologist about this He will start with 100 mg weekly and his prescription was sent, he will need to come in with his wife to learn how to do the injection at home  Hyperlipidemia: This has been well controlled with Lipitor, now on high intensity statin for his history of CAD  CORONARY artery disease: No recurrent chest pains, followed by cardiologist regularly  Hypothyroidism: Has recent normal TSH level, no current symptoms suggestive of hypothyroidism  Chronic insomnia:  He can continue using Xanax and imipramine  History of BPH: Fairly well controlled with Flomax except for some urgency and occasional nocturia.  He will see the urologist if needed  Attention deficit: He says that he is having difficulty focusing at his work and he will need to discuss this with his psychiatrist  Vitamin D deficiency: He will start taking 2000 units daily  Follow-up in 2 months    Leeyah Heather Dwyane Dee

## 2018-01-14 ENCOUNTER — Other Ambulatory Visit: Payer: Self-pay | Admitting: Endocrinology

## 2018-01-18 ENCOUNTER — Telehealth (HOSPITAL_COMMUNITY): Payer: Self-pay

## 2018-01-18 NOTE — Telephone Encounter (Signed)
Received fax refill request for Alprazolam 0.5mg  tabs. Pharmacy is requesting that the patient receives 60 tabs per month as oppose to 45 tabs per month. Pharmacy is [facility]. Next appointment is scheduled for 01/28/18.  Please advise

## 2018-01-19 ENCOUNTER — Other Ambulatory Visit (HOSPITAL_COMMUNITY): Payer: Self-pay

## 2018-01-19 ENCOUNTER — Other Ambulatory Visit: Payer: Self-pay | Admitting: Endocrinology

## 2018-01-19 NOTE — Telephone Encounter (Signed)
I spoke to the pharmacy and let them know that patient was supposed to stop taking the Alprazolam after the last visit.

## 2018-01-21 DIAGNOSIS — E039 Hypothyroidism, unspecified: Secondary | ICD-10-CM | POA: Diagnosis not present

## 2018-01-21 DIAGNOSIS — I10 Essential (primary) hypertension: Secondary | ICD-10-CM | POA: Diagnosis not present

## 2018-01-21 DIAGNOSIS — M1990 Unspecified osteoarthritis, unspecified site: Secondary | ICD-10-CM | POA: Diagnosis not present

## 2018-01-21 DIAGNOSIS — I25118 Atherosclerotic heart disease of native coronary artery with other forms of angina pectoris: Secondary | ICD-10-CM | POA: Diagnosis not present

## 2018-01-21 DIAGNOSIS — E785 Hyperlipidemia, unspecified: Secondary | ICD-10-CM | POA: Diagnosis not present

## 2018-01-21 DIAGNOSIS — J45909 Unspecified asthma, uncomplicated: Secondary | ICD-10-CM | POA: Diagnosis not present

## 2018-01-23 ENCOUNTER — Emergency Department (HOSPITAL_COMMUNITY): Payer: Medicare Other

## 2018-01-23 ENCOUNTER — Emergency Department (HOSPITAL_COMMUNITY)
Admission: EM | Admit: 2018-01-23 | Discharge: 2018-01-23 | Disposition: A | Discharge status: Home / Self Care | Payer: Medicare Other | Attending: Emergency Medicine | Admitting: Emergency Medicine

## 2018-01-23 ENCOUNTER — Encounter (HOSPITAL_COMMUNITY): Payer: Self-pay | Admitting: Emergency Medicine

## 2018-01-23 DIAGNOSIS — E785 Hyperlipidemia, unspecified: Secondary | ICD-10-CM | POA: Diagnosis not present

## 2018-01-23 DIAGNOSIS — K625 Hemorrhage of anus and rectum: Secondary | ICD-10-CM | POA: Diagnosis not present

## 2018-01-23 DIAGNOSIS — K529 Noninfective gastroenteritis and colitis, unspecified: Secondary | ICD-10-CM | POA: Diagnosis not present

## 2018-01-23 DIAGNOSIS — Z79899 Other long term (current) drug therapy: Secondary | ICD-10-CM | POA: Diagnosis not present

## 2018-01-23 DIAGNOSIS — K5289 Other specified noninfective gastroenteritis and colitis: Secondary | ICD-10-CM | POA: Insufficient documentation

## 2018-01-23 DIAGNOSIS — Z87891 Personal history of nicotine dependence: Secondary | ICD-10-CM | POA: Diagnosis not present

## 2018-01-23 DIAGNOSIS — N400 Enlarged prostate without lower urinary tract symptoms: Secondary | ICD-10-CM | POA: Diagnosis not present

## 2018-01-23 DIAGNOSIS — E039 Hypothyroidism, unspecified: Secondary | ICD-10-CM | POA: Diagnosis not present

## 2018-01-23 DIAGNOSIS — Z7982 Long term (current) use of aspirin: Secondary | ICD-10-CM | POA: Insufficient documentation

## 2018-01-23 LAB — CBC
HCT: 42.7 % (ref 39.0–52.0)
HEMOGLOBIN: 14 g/dL (ref 13.0–17.0)
MCH: 30.2 pg (ref 26.0–34.0)
MCHC: 32.8 g/dL (ref 30.0–36.0)
MCV: 92 fL (ref 78.0–100.0)
PLATELETS: 208 10*3/uL (ref 150–400)
RBC: 4.64 MIL/uL (ref 4.22–5.81)
RDW: 13.7 % (ref 11.5–15.5)
WBC: 7.6 10*3/uL (ref 4.0–10.5)

## 2018-01-23 LAB — COMPREHENSIVE METABOLIC PANEL
ALT: 20 U/L (ref 17–63)
AST: 20 U/L (ref 15–41)
Albumin: 3.8 g/dL (ref 3.5–5.0)
Alkaline Phosphatase: 49 U/L (ref 38–126)
Anion gap: 7 (ref 5–15)
BUN: 20 mg/dL (ref 6–20)
CHLORIDE: 104 mmol/L (ref 101–111)
CO2: 27 mmol/L (ref 22–32)
CREATININE: 1.38 mg/dL — AB (ref 0.61–1.24)
Calcium: 9.1 mg/dL (ref 8.9–10.3)
GFR calc Af Amer: 53 mL/min — ABNORMAL LOW (ref >60)
GFR, EST NON AFRICAN AMERICAN: 46 mL/min — AB (ref >60)
Glucose, Bld: 96 mg/dL (ref 65–99)
POTASSIUM: 4.3 mmol/L (ref 3.5–5.1)
SODIUM: 138 mmol/L (ref 135–145)
Total Bilirubin: 0.6 mg/dL (ref 0.3–1.2)
Total Protein: 6.4 g/dL — ABNORMAL LOW (ref 6.5–8.1)

## 2018-01-23 LAB — POC OCCULT BLOOD, ED: FECAL OCCULT BLD: POSITIVE — AB

## 2018-01-23 LAB — TYPE AND SCREEN
ABO/RH(D): A POS
Antibody Screen: NEGATIVE

## 2018-01-23 LAB — ABO/RH: ABO/RH(D): A POS

## 2018-01-23 MED ORDER — IOHEXOL 300 MG/ML  SOLN
100.0000 mL | Freq: Once | INTRAMUSCULAR | Status: AC | PRN
  Administered 2018-01-23: 100 mL via INTRAVENOUS

## 2018-01-23 MED ORDER — PANTOPRAZOLE SODIUM 40 MG IV SOLR
40.0000 mg | Freq: Once | INTRAVENOUS | Status: AC
  Administered 2018-01-23: 40 mg via INTRAVENOUS
  Filled 2018-01-23: qty 40

## 2018-01-23 MED ORDER — SODIUM CHLORIDE 0.9 % IV BOLUS
1000.0000 mL | Freq: Once | INTRAVENOUS | Status: AC
  Administered 2018-01-23: 1000 mL via INTRAVENOUS

## 2018-01-23 NOTE — ED Triage Notes (Signed)
No stool while in ED

## 2018-01-23 NOTE — ED Triage Notes (Signed)
Patient complains of rectal bleeding that started last night, states he was urinating and and then he accidentally had a loose bowel movement containing a dark red gritty substance the patient believes was blood. Patient takes one 81mg  aspirin daily, no anticoagulant. Stopped taking plavix a few months ago. Alert and oriented and in no apparent distress at this time.

## 2018-01-23 NOTE — Discharge Instructions (Addendum)
Your scan shows possible mild inflammation of colon. Given your mild abdominal pain, diarrhea, we will treat you for viral infection of color also known as enteritis.   Treatment is supportive. Take acetaminophen [phone] mg (tylenol) every 6-8 hours for abdominal pain.  Avoid dairy and high fiber foods to prevent worsening diarrhea. Yogurt however has been shown to be helpful to restore healthy and good colon bacteria.   Stay well hydrated.

## 2018-01-23 NOTE — ED Provider Notes (Signed)
[facility] EMERGENCY DEPARTMENT Provider Note   CSN: [id number] Arrival date & time: 01/23/18  1012     History   Chief Complaint Chief Complaint  Patient presents with  . Rectal Bleeding    HPI Benjamin Hahn is a 84 y.o. male w/ h/o BPH, depression, GERD, HLD, hypothyroidism here for evaluation of rectal bleeding. Onset this morning. He was standing up urinating and noticed a small bright red "clot" come out of rectum landing on the floor, painless. No episodes since. Last night he had 2-3 episodes of watery diarrhea, he denies melena or maroon colored diarrhea. Associated symptoms include mild, intermittent, "crampy" lower abdominal pain. This discomfort was worse immediately prior to having diarrhea, but has significantly improved since. He denies fevers, chills, nausea, vomiting, dysuria, hematuria, flank pain, testicular pain, abdominal distention.  No h/o PUD, diverticulitis. Has had colonoscopies in the past that were normal, last 5-7 years ago. H/o CAD s/p stents stopped plavix 2 months ago, taking daily baby aspirin. No h/o GIB. Denies heavy NSAID use, ETOH.   HPI  Past Medical History:  Diagnosis Date  . Anxiety   . Arthritis    might be in back, no problems  . BPH (benign prostatic hyperplasia)   . Depression 1990s  . GERD (gastroesophageal reflux disease)    occasional  . Hypercholesteremia    controled  . Hypothyroid   . Shingles May 2014   "Right face, still has some"  . Temporal arteritis ([place])   . Transient blindness of both eyes     Patient Active Problem List   Diagnosis Date Noted  . Acute coronary syndrome ([place]) 08/30/2015  . Temporal arteritis ([place]) 06/16/2013  . Amaurosis fugax 06/01/2013  . Unspecified hypothyroidism 03/14/2013  . Other and unspecified hyperlipidemia 03/14/2013  . BPH (benign prostatic hypertrophy) 03/14/2013  . Hypogonadism, male 03/14/2013  . Erectile dysfunction 03/14/2013    Past Surgical History:    Procedure Laterality Date  . ARTERY BIOPSY Right 06/20/2013   Procedure: BIOPSY TEMPORAL ARTERY RIGHT;  Surgeon: Earnstine Regal, MD;  Location: [facility];  Service: General;  Laterality: Right;  . CARDIAC CATHETERIZATION N/A 09/02/2015   Procedure: Left Heart Cath and Coronary Angiography;  Surgeon: Charolette Forward, MD;  Location: [facility];  Service: Cardiovascular;  Laterality: N/A;  . CARDIAC CATHETERIZATION N/A 09/02/2015   Procedure: Coronary Stent Intervention;  Surgeon: Charolette Forward, MD;  Location: [facility];  Service: Cardiovascular;  Laterality: N/A;  1.  mid RCA      (3.0/28mm Xience) 2.  Mid LAD      (3.0/23mm Xience)  . NO PAST SURGERIES    . None          Home Medications    Prior to Admission medications   Medication Sig Start Date End Date Taking? Authorizing Provider  acetaminophen (TYLENOL) 500 MG tablet Take 500 mg by mouth daily as needed (pain).    [provider]  albuterol (PROVENTIL HFA;VENTOLIN HFA) 108 (90 Base) MCG/ACT inhaler Inhale 2 puffs into the lungs every 6 (six) hours as needed for wheezing or shortness of breath. 12/11/16   Elayne Snare, MD  aspirin 81 MG chewable tablet Chew 1 tablet (81 mg total) by mouth daily. 09/03/15   Charolette Forward, MD  atorvastatin (LIPITOR) 40 MG tablet Take 1 tablet (40 mg total) by mouth daily at 6 PM. 09/03/15   Charolette Forward, MD  benzonatate (TESSALON) 200 MG capsule Take 1 capsule (200 mg  total) by mouth 2 (two) times daily as needed for cough. 10/09/17   Elayne Snare, MD  buPROPion (WELLBUTRIN XL) 150 MG 24 hr tablet Take 1 tablet (150 mg total) by mouth every morning. 12/01/17 12/01/18  Plovsky, Berneta Sages, MD  clobetasol cream (TEMOVATE) 0.05 % APPLY AS DIRECTED. 08/03/17   Elayne Snare, MD  clopidogrel (PLAVIX) 75 MG tablet Take 1 tablet (75 mg total) by mouth daily with breakfast. 09/03/15   Charolette Forward, MD  doxycycline (VIBRA-TABS) 100 MG tablet Take 1 tablet (100 mg total) by mouth 2 (two) times daily.  11/09/17   Elayne Snare, MD  hydrOXYzine (ATARAX/VISTARIL) 10 MG tablet TAKE 1 TABLET ONCE DAILY FOR ITCHING 01/15/18   Elayne Snare, MD  imipramine (TOFRANIL) 10 MG tablet Take 1 tablet (10 mg total) at bedtime by mouth. 06/30/17   Plovsky, Berneta Sages, MD  levothyroxine (SYNTHROID, LEVOTHROID) 88 MCG tablet TAKE 1 TABLET BY MOUTH DAILY. 01/19/18   Elayne Snare, MD  LORazepam (ATIVAN) 0.5 MG tablet 1  qhs Patient taking differently: 1  qhs 12/01/17   Plovsky, Berneta Sages, MD  metoprolol succinate (TOPROL-XL) 25 MG 24 hr tablet TAKE (1/2) TABLET DAILY. 01/19/18   Elayne Snare, MD  mupirocin ointment (BACTROBAN) 2 % Apply 1 application topically 2 (two) times daily. Patient taking differently: Apply 1 application topically 2 (two) times daily.  10/29/17   Elayne Snare, MD  nitroGLYCERIN (NITROSTAT) 0.4 MG SL tablet Place 1 tablet (0.4 mg total) under the tongue every 5 (five) minutes as needed for chest pain (CP or SOB). 09/03/15   Charolette Forward, MD  ramipril (ALTACE) 1.25 MG capsule Take 1 capsule (1.25 mg total) by mouth daily. 09/03/15   Charolette Forward, MD  Syringe/Needle, Disp, (SYRINGE 3CC/25GX1-1/2") 25G X 1-1/2" 3 ML MISC 1 each by Does not apply route once a week. Use one syringe each week to inject testosterone 01/06/18   Elayne Snare, MD  tamsulosin (FLOMAX) 0.4 MG CAPS capsule TAKE (1) CAPSULE DAILY. Patient taking differently: TAKE (2) CAPSULES  DAILY AFTER SUPPER 04/17/14   Elayne Snare, MD  Testosterone 20.25 MG/ACT (1.62%) GEL Apply 2 pumps to each arm once a day Patient taking differently: Apply 2 pumps to each arm once a day 11/03/17   Elayne Snare, MD  testosterone cypionate (DEPOTESTOSTERONE CYPIONATE) 200 MG/ML injection Inject 0.5 mLs (100 mg total) into the muscle once a week. 01/06/18   Elayne Snare, MD  venlafaxine XR (EFFEXOR XR) 75 MG 24 hr capsule Take 1 capsule (75 mg total) by mouth daily. 01/06/18 01/06/19  Elayne Snare, MD    Family History Family History  Problem Relation Age of Onset  . Stroke  Father        Died, 80s  . Stroke Sister        Living, 60  . Heart disease Mother        Died, 44  . Healthy Daughter   . Diabetes Maternal Grandmother   . Hypertension Neg Hx     Social History Social History   Tobacco Use  . Smoking status: Former Smoker    Types: Cigarettes    Last attempt to quit: 08/24/1966    Years since quitting: 51.4  . Smokeless tobacco: Never Used  Substance Use Topics  . Alcohol use: Yes    Alcohol/week: 1.2 oz    Types: 2 Shots of liquor per week    Comment: socially  . Drug use: No     Allergies   Patient has no known allergies.  Review of Systems Review of Systems  Gastrointestinal: Positive for abdominal pain, blood in stool and diarrhea.  All other systems reviewed and are negative.    Physical Exam Updated Vital Signs BP 109/68 (BP Location: Left Arm)   Pulse 71   Temp 97.9 F (36.6 C) (Oral)   Resp 17   SpO2 98%   Physical Exam  Constitutional: He is oriented to person, place, and time. He appears well-developed and well-nourished. No distress.  Non toxic.  HENT:  Head: Normocephalic and atraumatic.  Nose: Nose normal.  Moist mucous membranes   Eyes: Pupils are equal, round, and reactive to light. Conjunctivae and EOM are normal.  Neck: Normal range of motion.  Cardiovascular: Normal rate, regular rhythm and normal heart sounds.  No murmur heard. 2+ DP and radial pulses bilaterally. No LE edema.   Pulmonary/Chest: Effort normal and breath sounds normal.  Abdominal: Soft. Bowel sounds are normal. There is tenderness in the right lower quadrant and left lower quadrant. There is tenderness at McBurney's point.  No G/R/R. No distention. No suprapubic or CVA tenderness.  Musculoskeletal: Normal range of motion.  Neurological: He is alert and oriented to person, place, and time.  Skin: Skin is warm and dry. Capillary refill takes less than 2 seconds.  Psychiatric: He has a normal mood and affect. His behavior is normal.  Judgment and thought content normal.  Nursing note and vitals reviewed.    ED Treatments / Results  Labs (all labs ordered are listed, but only abnormal results are displayed) Labs Reviewed  COMPREHENSIVE METABOLIC PANEL - Abnormal; Notable for the following components:      Result Value   Creatinine, Ser 1.38 (*)    Total Protein 6.4 (*)    GFR calc non Af Amer 46 (*)    GFR calc Af Amer 53 (*)    All other components within normal limits  POC OCCULT BLOOD, ED - Abnormal; Notable for the following components:   Fecal Occult Bld POSITIVE (*)    All other components within normal limits  GASTROINTESTINAL PANEL BY PCR, STOOL (REPLACES STOOL CULTURE)  CBC  TYPE AND SCREEN  ABO/RH    EKG None  Radiology Ct Abdomen Pelvis W Contrast  Result Date: 01/23/2018 CLINICAL DATA:  Rectal bleeding. EXAM: CT ABDOMEN AND PELVIS WITH CONTRAST TECHNIQUE: Multidetector CT imaging of the abdomen and pelvis was performed using the standard protocol following bolus administration of intravenous contrast. CONTRAST:  164mL OMNIPAQUE IOHEXOL 300 MG/ML  SOLN COMPARISON:  Chest CT-08/31/2015; 09/09/2013 FINDINGS: Lower chest: Limited visualization of the lower thorax demonstrates improved aeration of the right lung base with minimal residual centrally calcified nodular opacity within the right lung base now measuring approximately 1.9 x 1.4 cm (image 14, series 4), previously, 4.7 x 2.0 cm. Adjacent linear heterogeneous opacities in volume loss, also improved compared to the 08/2015 examination. Subsegmental atelectasis within the caudal aspect of the right middle lobe is unchanged. Punctate (approximately 3 mm) nodule within the left costophrenic angle is unchanged compared to the 08/2013 examination and thus of benign etiology. Normal heart size. Coronary artery calcifications. No pericardial effusion. Hepatobiliary: Normal hepatic contour. Note is made of an approximately 1.5 cm hypoattenuating (-9 Hounsfield  unit) cyst within the subcapsular aspect of the posterior segment of the right lobe of the liver (image 17, series 3). Punctate (approximately 4 mm) hypoattenuating lesion within the medial segment of left lobe of the liver (image 21, series 3) is too small to adequately characterize of  favored to represent additional hepatic cysts. Normal appearance of the gallbladder given degree distention. No radiopaque gallstones. No intra extrahepatic bili duct dilatation. No ascites. Pancreas: Normal appearance of the pancreas Spleen: Normal appearance of the spleen Adrenals/Urinary Tract: There is symmetric enhancement and excretion of the bilateral kidneys. Note is made of an approximately 1.1 cm hypoattenuating nonenhancing partially exophytic cyst arising from the posterosuperior aspect of the left kidney (image 29, series 3). Additional subcentimeter hypoattenuating bilateral renal lesions are too small to adequately characterize of favored to represent additional renal cysts. No definite renal stones on this postcontrast examination. There is a minimal amount of grossly symmetric bilateral age and body habitus related perinephric stranding. No urine obstruction. Normal appearance the bilateral adrenal glands. There is mild thickening of the urinary bladder wall. Note is made of a approximately 2.8 x 2.0 x 3.6 cm diverticulum arising from the posterior aspect of the base of the urinary bladder (axial image 69, series 3, sagittal image 85, series 7). Stomach/Bowel: There is apparent circumferential wall thickening involving the descending colon (representative axial images 18, 27 and 49; coronal images 43, 54 and 72, series 6). Scattered colonic diverticulosis without evidence of diverticulitis. No evidence of enteric obstruction. Normal appearance of the terminal ileum and retrocecal appendix. No pneumoperitoneum, pneumatosis or portal venous gas. Vascular/Lymphatic: Mixed calcified and noncalcified atherosclerotic  plaque throughout the abdominal aorta, not resulting in hemodynamically significant stenosis. Major branch vessels of the abdominal aorta appear patent on this non CTA examination. Scattered retroperitoneal and mesenteric lymph nodes are numerous though individually not enlarged by size criteria. No bulky retroperitoneal, mesenteric, pelvic or inguinal lymphadenopathy. Reproductive: The prostate is markedly enlarged with mass effect upon the undersurface of the urinary bladder measuring at least 6.2 x 5.9 x 8.1 cm (axial image 75, series 3, sagittal image 85, series 7). Other: Mild diffuse body wall anasarca. Small mesenteric fat containing left-sided direct inguinal hernia. Musculoskeletal: No acute or aggressive osseous abnormalities. Stigmata of DISH throughout the thoracolumbar spine. Mild-to-moderate multilevel lumbar spine DDD, worse at L4-L5 with disc space height loss, endplate irregularity and sclerosis. Bilateral facet degenerative change within the lower lumbar spine. IMPRESSION: 1. Apparent circumferential wall thickening involving the descending colon, potentially accentuated due to underdistention though a enteritis could have a similar appearance. Clinical correlation is advised. No evidence of enteric obstruction. 2. Scattered colonic diverticulosis without evidence of diverticulitis. 3. Marked prostatomegaly with mass effect upon the undersurface of the urinary bladder. There is mild diffuse thickening of the urinary bladder wall with associated approximately 3.6 cm urinary bladder diverticulum - constellation of findings suggestive of chronic urinary bladder outlet obstruction. If not recently performed, further evaluation with DRE is advised. 4. Improved aeration of the right lung base with minimal residual centrally calcified nodular opacity currently measuring 1.9 cm, previously, 4.7 cm, favored to represent a small area atelectasis/scar/rounded atelectasis. 5.  Aortic Atherosclerosis  (ICD10-I70.0). Electronically Signed   By: Sandi Mariscal M.D.   On: 01/23/2018 14:13    Procedures Procedures (including critical care time)  Medications Ordered in ED Medications  sodium chloride 0.9 % bolus 1,000 mL (0 mLs Intravenous Stopped 01/23/18 2247)  pantoprazole (PROTONIX) injection 40 mg (40 mg Intravenous Given 01/23/18 1457)  iohexol (OMNIPAQUE) 300 MG/ML solution 100 mL (100 mLs Intravenous Contrast Given 01/23/18 1331)     Initial Impression / Assessment and Plan / ED Course  I have reviewed the triage vital signs and the nursing notes.  Pertinent labs & imaging results that were  available during my care of the patient were reviewed by me and considered in my medical decision making (see chart for details).  Clinical Course as of Jan 24 1242  Sun Jan 23, 2018  1216 Fecal Occult Blood, POC(!): POSITIVE [CG]  1244 Pharmacy to order protonix IV    [CG]  1419 IMPRESSION: 1. Apparent circumferential wall thickening involving the descending colon, potentially accentuated due to underdistention though a enteritis could have a similar appearance. Clinical correlation is advised. No evidence of enteric obstruction. 2. Scattered colonic diverticulosis without evidence of diverticulitis. 3. Marked prostatomegaly with mass effect upon the undersurface of the urinary bladder. There is mild diffuse thickening of the urinary bladder wall with associated approximately 3.6 cm urinary bladder diverticulum - constellation of findings suggestive of chronic urinary bladder outlet obstruction. If not recently performed, further evaluation with DRE is advised. 4. Improved aeration of the right lung base with minimal residual centrally calcified nodular opacity currently measuring 1.9 cm, previously, 4.7 cm, favored to represent a small area atelectasis/scar/rounded atelectasis. 5. Aortic Atherosclerosis (ICD10-I70.0).    CT ABDOMEN PELVIS W CONTRAST [CG]    Clinical Course User  Index [CG] Benjamin Feil, PA-C   ddx includes viral gastroenteritis causing diarrhea and irritation of rectum, diverticulitis, bacterial infectious diarrhea, ulcer. Given age malignancy on differential as well. Low suspicion for SBO, perforated viscous.   Exam as above reassuring. He has minimal abd tenderness. No peritonitis. Afebrile.   Labs remarkable for creatinine 1.38 likely  due to mild dehydration. Hgb WNL CTAP remarkable for wall thickening of descending colon which could be accentuated on imaging due to undersdistention, could also be enteritis.   Final Clinical Impressions(s) / ED Diagnoses   Given mild abd tenderness, diarrhea, favoring pt has mild viral enteritis. His symptoms have resolved PTA and he has no n/v, abdominal pain, has had no episodes of diarrhea in ER, and tolerating PO.  Pt considered adequate for dc with symptomatic control, oral hydration.  Considering abx however symptoms are mild and now appear to be significantly improved. Will defer. Provided specific return precautions. He and wife aware of s/s that would warrant return to ER. He has a gastroenterologist, recommend f/u in 1 week if he continues to have BRBPR.  Pt shared with Dr Gilford Raid.  Final diagnoses:  Enteritis  Bright red blood per rectum    ED Discharge Orders    None       Arlean Hopping 01/24/18 1243    Isla Pence, MD 01/26/18 [phone]

## 2018-01-24 ENCOUNTER — Telehealth: Payer: Self-pay

## 2018-01-24 NOTE — Telephone Encounter (Signed)
PA submitted for Testosterone Cypionate 200mg /mL give 100mg  IM Q week.  Key: [id number]

## 2018-01-25 ENCOUNTER — Telehealth: Payer: Self-pay | Admitting: Endocrinology

## 2018-01-25 ENCOUNTER — Telehealth (HOSPITAL_COMMUNITY): Payer: Self-pay

## 2018-01-25 NOTE — Telephone Encounter (Signed)
Patient called and said that the Lorazepam isn't working very well and he is requesting a replacement of another type of medication. Next appointment is scheduled for 01-28-18. Please advise

## 2018-01-25 NOTE — Telephone Encounter (Signed)
testosterone cypionate (DEPOTESTOSTERONE CYPIONATE) 200 MG/ML injection   [employer] stated that PA has been approved and they have notified patient.

## 2018-01-26 ENCOUNTER — Encounter: Payer: Self-pay | Admitting: Endocrinology

## 2018-01-26 ENCOUNTER — Ambulatory Visit (INDEPENDENT_AMBULATORY_CARE_PROVIDER_SITE_OTHER): Payer: Medicare Other | Admitting: Endocrinology

## 2018-01-26 VITALS — BP 118/60 | HR 66 | Ht 67.5 in | Wt 181.2 lb

## 2018-01-26 DIAGNOSIS — E7800 Pure hypercholesterolemia, unspecified: Secondary | ICD-10-CM

## 2018-01-26 DIAGNOSIS — F32A Depression, unspecified: Secondary | ICD-10-CM

## 2018-01-26 DIAGNOSIS — F329 Major depressive disorder, single episode, unspecified: Secondary | ICD-10-CM

## 2018-01-26 DIAGNOSIS — F320 Major depressive disorder, single episode, mild: Secondary | ICD-10-CM

## 2018-01-26 DIAGNOSIS — E063 Autoimmune thyroiditis: Secondary | ICD-10-CM

## 2018-01-26 MED ORDER — VENLAFAXINE HCL ER 37.5 MG PO CP24: 37.5000 mg | ORAL_CAPSULE | Freq: Every day | ORAL | 0 refills | Status: DC

## 2018-01-26 NOTE — Progress Notes (Signed)
Subjective:      Patient ID: Benjamin Hahn, male   DOB: 06/05/1933, 84 y.o.   MRN: [id number]  Chief complaint: Follow-up of various problems  HPI      Hospital/ER follow-up: He was in the emergency room for rectal bleeding although this was proceeded by some diarrhea.  His evaluation indicated possible colitis scan and apparently has resolved spontaneously, no medication was given and he says his bowel movements are back to normal without any bleeding.  No abdominal pain.  No anemia found  Lab Results  Component Value Date   HCT 42.7 01/23/2018     HYPOTHYROIDISM: This is long-standing and he is usually on a stable dose since 2014 Currently taking 88 g which has been continued for some time His TSH was higher at one-point and this was because of his taking the levothyroxine after eating in the morning instead of before  TSH is normal as of 3/19   Lab Results  Component Value Date   TSH 2.77 11/01/2017   TSH 5.73 (H) 03/03/2017   TSH 2.91 11/04/2016   FREET4 0.89 11/01/2017   FREET4 0.91 03/03/2017   FREET4 0.96 11/04/2016    HYPOGONADISM: This has been managed by her urologist for the last few years However he had been on testosterone injection for some time and now his cardiologist does not want him to take these because of cardiovascular risk He has had testosterone levels that have been undetectable without treatment and he has significant fatigue and lethargy without treatment At some point he was taking custom-made cream from the urologist when he was not able to get his Testopel while on Plavix However Testopel is apparently too expensive for him Not sure what his last testosterone level was, no records available for the last 2 years from urologist  He was getting injections every 2 weeks but this was stopped by the cardiologist He was unable to get adequate levels on AndroGel and this was too expensive Also he was supposed to start back on injections but he  says he cannot do this himself because of tremor He is still feeling fatigued He is going to start the injections back from his urologist for his discontinued  Lab Results  Component Value Date   TESTOSTERONE 54.30 (L) 01/05/2018   TESTOSTERONE 361.35 03/03/2017   TESTOSTERONE 197.11 (L) 07/02/2016   TESTOSTERONE 433.39 03/16/2014   TESTOSTERONE 0.00 Repeated and verified X2. (L) 10/31/2013       PREDIABETES: The following is a copy of the previous note:  His fasting glucose has been normal more recently on the on this lab work he had oatmeal before his labs and glucose 110 A1c is in the prediabetic range although as low as 5.7   A1c is 5.9, previously 6.1 He has a grandmother with diabetes  He has been able to keep his weight about the same Only recently has started going back to the [facility] for exercise and tries to walk otherwise also Usually watching portions  Wt Readings from Last 3 Encounters:  01/26/18 181 lb 3.2 oz (82.2 kg)  01/06/18 181 lb 3.2 oz (82.2 kg)  11/03/17 187 lb (84.8 kg)   Lab Results  Component Value Date   HGBA1C 5.9 11/01/2017   HGBA1C 5.7 06/30/2017   HGBA1C 6.1 06/01/2013   Lab Results  Component Value Date   LDLCALC 42 11/01/2017   CREATININE 1.38 (H) 01/23/2018     DEPRESSION: Treated by psychiatrist. Previously controlled with Wellbutrin ER,  taking 150 mg dosage; also taking imipramine 10 mg at night to help sleep.   Has been on imipramine for several years Has been also advised to start back on Effexor last month but not clear why he did not do so, he is not clear but whether he tried this or not He thinks his depression is somewhat better  Needs Xanax for anxiety as needed but he thinks he needs this for sleep, his psychiatrist gave him lorazepam which he thought caused him dizziness Also takes Atarax but this does not help his sleep as much  LIPIDS: See review of systems  Allergies as of 01/26/2018   No Known Allergies       Medication List        Accurate as of 01/26/18  8:30 AM. Always use your most recent med list.          acetaminophen 500 MG tablet Commonly known as:  TYLENOL Take 500 mg by mouth daily as needed (pain).   aspirin 81 MG chewable tablet Chew 1 tablet (81 mg total) by mouth daily.   atorvastatin 40 MG tablet Commonly known as:  LIPITOR Take 1 tablet (40 mg total) by mouth daily at 6 PM.   buPROPion 150 MG 24 hr tablet Commonly known as:  WELLBUTRIN XL Take 1 tablet (150 mg total) by mouth every morning.   clobetasol cream 0.05 % Commonly known as:  TEMOVATE APPLY AS DIRECTED.   hydrOXYzine 10 MG tablet Commonly known as:  ATARAX/VISTARIL TAKE 1 TABLET ONCE DAILY FOR ITCHING   imipramine 10 MG tablet Commonly known as:  TOFRANIL Take 1 tablet (10 mg total) at bedtime by mouth.   levothyroxine 88 MCG tablet Commonly known as:  SYNTHROID, LEVOTHROID TAKE 1 TABLET BY MOUTH DAILY.   metoprolol succinate 25 MG 24 hr tablet Commonly known as:  TOPROL-XL TAKE (1/2) TABLET DAILY.   mupirocin ointment 2 % Commonly known as:  BACTROBAN Apply 1 application topically 2 (two) times daily.   nitroGLYCERIN 0.4 MG SL tablet Commonly known as:  NITROSTAT Place 1 tablet (0.4 mg total) under the tongue every 5 (five) minutes as needed for chest pain (CP or SOB).   ramipril 1.25 MG capsule Commonly known as:  ALTACE Take 1 capsule (1.25 mg total) by mouth daily.   SYRINGE 3CC/25GX1-1/2" 25G X 1-1/2" 3 ML Misc 1 each by Does not apply route once a week. Use one syringe each week to inject testosterone   tamsulosin 0.4 MG Caps capsule Commonly known as:  FLOMAX TAKE (1) CAPSULE DAILY.   testosterone cypionate 200 MG/ML injection Commonly known as:  DEPOTESTOSTERONE CYPIONATE Inject 0.5 mLs (100 mg total) into the muscle once a week.   venlafaxine XR 75 MG 24 hr capsule Commonly known as:  EFFEXOR XR Take 1 capsule (75 mg total) by mouth daily.       Review of Systems     VITAMIN D deficiency: He has not been taking any supplements and his level is below 30 He has not started the supplement as recommended on the previous 2 visits   Following is a copy of the previous note:  Hypercholesterolemia: This has been long-standing Since his MI he has been on 40 mg Lipitor from his cardiologist, previously on 10 mg LDL is below 70 again   Lab Results  Component Value Date   CHOL 108 11/01/2017   HDL 55.90 11/01/2017   LDLCALC 42 11/01/2017   TRIG 48.0 11/01/2017   CHOLHDL 2  11/01/2017      BPH: He  takes 0.8 mg of Flomax from his urologist with fairly good control of nocturia and frequency, he was told that he has inadequate emptying of the bladder also  No recent vertigo but if he is bending forward he may over balance  HYPERTENSION: Not present, only on minimal doses of ramipril for cardiac reasons      Objective:   Physical Exam BP 118/60 (BP Location: Left Arm, Patient Position: Sitting, Cuff Size: Normal)   Pulse 66   Ht 5' 7.5" (1.715 m)   Wt 181 lb 3.2 oz (82.2 kg)   SpO2 99%   BMI 27.96 kg/m      Assessment:      Hypogonadism: He is going to need injections for ongoing treatment because of his very low levels and other medications being ineffective follow-up with urologist for this    Depression, anxiety and chronic insomnia: Although he is relatively better than last month he still has some depression and wants medication for sleeping    Vitamin D deficiency: He is not on any supplement and his level is below 30    Plan:      Trial of 37.5 mg venlafaxine at least until he sees the psychiatrist  He can try taking 2 capsules of imipramine the next 2 nights and then discuss further treatment with psychiatrist  Back brace prescription given  Vitamin D3, 2000 units daily    Elayne Snare

## 2018-01-26 NOTE — Patient Instructions (Addendum)
Take 2 Imipramine at nite and no Hydroxyzine   Vitamin D3, 2000 units daily

## 2018-01-28 ENCOUNTER — Ambulatory Visit (INDEPENDENT_AMBULATORY_CARE_PROVIDER_SITE_OTHER): Payer: Medicare Other | Admitting: Psychiatry

## 2018-01-28 ENCOUNTER — Encounter (HOSPITAL_COMMUNITY): Payer: Self-pay | Admitting: Psychiatry

## 2018-01-28 VITALS — BP 118/70 | HR 78 | Ht 67.5 in | Wt 180.2 lb

## 2018-01-28 DIAGNOSIS — F324 Major depressive disorder, single episode, in partial remission: Secondary | ICD-10-CM

## 2018-01-28 DIAGNOSIS — Z87891 Personal history of nicotine dependence: Secondary | ICD-10-CM | POA: Diagnosis not present

## 2018-01-28 MED ORDER — IMIPRAMINE HCL 10 MG PO TABS: ORAL_TABLET | ORAL | 5 refills | Status: DC

## 2018-01-28 MED ORDER — VENLAFAXINE HCL ER 37.5 MG PO CP24: 37.5000 mg | ORAL_CAPSULE | Freq: Every day | ORAL | 5 refills | Status: DC

## 2018-01-28 MED ORDER — BUPROPION HCL ER (XL) 150 MG PO TB24: 150.0000 mg | ORAL_TABLET | ORAL | 6 refills | Status: DC

## 2018-01-28 NOTE — Progress Notes (Signed)
Patient ID: Benjamin Hahn, male   DOB: 1933/01/26, 84 y.o.   MRN: [id number] [facility] MD/PA/NP OP Progress Note  01/28/2018 9:33 AM [name]  MRN:  [id number]  Chief Complaint:  Subjective: Doing great Diagnosis : Major depression chronic, recurrent Today the patient is doing fairly well. The patient says he has some problems with Ativan. He claims we will go bilirubin he felt too dizzy. It is hard to imagine this is related to the Ativanprobably related to the multiple medications she is taking noted is that he no longer takes Xanax. Noted also he was hospitalized because he has GI bleeding which I think are contributing to an episode of being dizzy. The patient therefore no longer takes Xanax and no longer takes. His primary care doctor for reasons that are not clear adjusted down his Effexor to taking 37.5 mg. The patient is significant problem is excessive use of alcohol. He describes drinking 3 drinks a day. I suspect is more. Once again we talked about the dangers of drinking that much. I shared with him the best way to drinking one or 2 but no more than that. Once again patient she'll try. Nonetheless at this time we'll stay all benzodiazepines but we will asked him to start taking some melatonin. The patient complains of sleep. He does say he has some problems going off to sleep. He however has no daytime dysfunction. He is not sleepy does not take naps and does not falls asleep during the day. The patient is eating well. He enjoys television and music going to reduce. Overall his health is good. He denies chest pain or shortness of breath. It's noted that he does have cardiovascular disease. Financially his life is up and down. 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 He is getting along well with his family.His daughters a to visit this week. The patient is not suicidal. He is functioning extremely well. Past Medical History:  Diagnosis Date  . Anxiety   . Arthritis    might be  in back, no problems  . BPH (benign prostatic hyperplasia)   . Depression 1990s  . GERD (gastroesophageal reflux disease)    occasional  . Hypercholesteremia    controled  . Hypothyroid   . Shingles May 2014   "Right face, still has some"  . Temporal arteritis ([place])   . Transient blindness of both eyes     Past Surgical History:  Procedure Laterality Date  . ARTERY BIOPSY Right 06/20/2013   Procedure: BIOPSY TEMPORAL ARTERY RIGHT;  Surgeon: Earnstine Regal, MD;  Location: [facility];  Service: General;  Laterality: Right;  . CARDIAC CATHETERIZATION N/A 09/02/2015   Procedure: Left Heart Cath and Coronary Angiography;  Surgeon: Charolette Forward, MD;  Location: [place] [facility];  Service: Cardiovascular;  Laterality: N/A;  . CARDIAC CATHETERIZATION N/A 09/02/2015   Procedure: Coronary Stent Intervention;  Surgeon: Charolette Forward, MD;  Location: [place] [facility];  Service: Cardiovascular;  Laterality: N/A;  1.  mid RCA      (3.0/28mm Xience) 2.  Mid LAD      (3.0/23mm Xience)  . NO PAST SURGERIES    . None      Family Psychiatric History:   Family History:  Family History  Problem Relation Age of Onset  . Stroke Father        Died, 80s  . Stroke Sister        Living, 75  . Heart disease Mother  Died, 92  . Healthy Daughter   . Diabetes Maternal Grandmother   . Hypertension Neg Hx     Social History:  Social History   Socioeconomic History  . Marital status: Married    Spouse name: Not on file  . Number of children: Not on file  . Years of education: Not on file  . Highest education level: Not on file  Occupational History  . Not on file  Social Needs  . Financial resource strain: Not on file  . Food insecurity:    Worry: Not on file    Inability: Not on file  . Transportation needs:    Medical: Not on file    Non-medical: Not on file  Tobacco Use  . Smoking status: Former Smoker    Types: Cigarettes    Last attempt to quit: 08/24/1966    Years since quitting:  51.4  . Smokeless tobacco: Never Used  Substance and Sexual Activity  . Alcohol use: Yes    Alcohol/week: 1.2 oz    Types: 2 Shots of liquor per week    Comment: socially  . Drug use: No  . Sexual activity: Not on file  Lifestyle  . Physical activity:    Days per week: Not on file    Minutes per session: Not on file  . Stress: Not on file  Relationships  . Social connections:    Talks on phone: Not on file    Gets together: Not on file    Attends religious service: Not on file    Active member of club or organization: Not on file    Attends meetings of clubs or organizations: Not on file    Relationship status: Not on file  Other Topics Concern  . Not on file  Social History Narrative   Currently works as a Midwife.  Lives with wife and they have two healthy daughters.    Allergies:  Allergies  Allergen Reactions  . Lorazepam     Metabolic Disorder Labs: Lab Results  Component Value Date   HGBA1C 5.9 11/01/2017   No results found for: PROLACTIN Lab Results  Component Value Date   CHOL 108 11/01/2017   TRIG 48.0 11/01/2017   HDL 55.90 11/01/2017   CHOLHDL 2 11/01/2017   VLDL 9.6 11/01/2017   LDLCALC 42 11/01/2017   LDLCALC 57 03/03/2017     Current Medications: Current Outpatient Medications  Medication Sig Dispense Refill  . acetaminophen (TYLENOL) 500 MG tablet Take 500 mg by mouth daily as needed (pain).    [name] aspirin 81 MG chewable tablet Chew 1 tablet (81 mg total) by mouth daily. 30 tablet 3  . atorvastatin (LIPITOR) 40 MG tablet Take 1 tablet (40 mg total) by mouth daily at 6 PM. 30 tablet 3  . buPROPion (WELLBUTRIN XL) 150 MG 24 hr tablet Take 1 tablet (150 mg total) by mouth every morning. 30 tablet 6  . clobetasol cream (TEMOVATE) 0.05 % APPLY AS DIRECTED. 30 g 0  . hydrOXYzine (ATARAX/VISTARIL) 10 MG tablet TAKE 1 TABLET ONCE DAILY FOR ITCHING 30 tablet 0  . imipramine (TOFRANIL) 10 MG tablet 2 q hs 60 tablet 5  . levothyroxine (SYNTHROID,  LEVOTHROID) 88 MCG tablet TAKE 1 TABLET BY MOUTH DAILY. 30 tablet 0  . metoprolol succinate (TOPROL-XL) 25 MG 24 hr tablet TAKE (1/2) TABLET DAILY. 30 tablet 0  . nitroGLYCERIN (NITROSTAT) 0.4 MG SL tablet Place 1 tablet (0.4 mg total) under the tongue every 5 (five) minutes  as needed for chest pain (CP or SOB). 25 tablet 12  . ramipril (ALTACE) 1.25 MG capsule Take 1 capsule (1.25 mg total) by mouth daily. 30 capsule 3  . Syringe/Needle, Disp, (SYRINGE 3CC/25GX1-1/2") 25G X 1-1/2" 3 ML MISC 1 each by Does not apply route once a week. Use one syringe each week to inject testosterone 100 each 0  . tamsulosin (FLOMAX) 0.4 MG CAPS capsule TAKE (1) CAPSULE DAILY. (Patient taking differently: TAKE (2) CAPSULES  DAILY AFTER SUPPER) 30 capsule 3  . testosterone cypionate (DEPOTESTOSTERONE CYPIONATE) 200 MG/ML injection Inject 0.5 mLs (100 mg total) into the muscle once a week. 10 mL 1  . venlafaxine XR (EFFEXOR-XR) 37.5 MG 24 hr capsule Take 1 capsule (37.5 mg total) by mouth daily. 30 capsule 5  . mupirocin ointment (BACTROBAN) 2 % Apply 1 application topically 2 (two) times daily. (Patient taking differently: Apply 1 application topically 2 (two) times daily. ) 22 g 0   No current facility-administered medications for this visit.     Neurologic: Headache: No Seizure: No Paresthesias: No  Musculoskeletal: Strength & Muscle Tone: within normal limits Gait & Station: normal Patient leans: NA  Psychiatric Specialty Exam: ROS  Blood pressure 118/70, pulse 78, height 5' 7.5" (1.715 m), weight 180 lb 3.2 oz (81.7 kg), SpO2 96 %.Body mass index is 27.81 kg/m.  General Appearance: Fairly Groomed  Eye Contact:  Good  Speech:  Clear and Coherent  Volume:  Normal  Mood:  Euthymic  Affect:  Appropriate  Thought Process:  Coherent  Orientation:  Full (Time, Place, and Person)  Thought Content:  WDL  Suicidal Thoughts:  No  Homicidal Thoughts:  No  Memory:  NA  Judgement:  Good  Insight:  Good   Psychomotor Activity:  Normal  Concentration:  Good  Recall:  Good  Fund of Knowledge: Good  Language: Good  Akathisia:  No  Handed:  Right  AIMS (if indicated):    Assets:  Desire for Improvement  ADL's:  Intact  Cognition: WNL  Sleep:       Treatment Plan Summary:At this time the patient is doing fairly well. We had a long discussion of his medications. I shared with him that I would rather not be on imipramine for long-term basis given its anticholinergic effects area on the other hand he denies sedation constipation or blurred vision. For now we'll continue it but we will make some changes. We will start him back on Effexor at a lower dose of 75 mg XR and changes Wellbutrin from 100 mg slow release to dose of 150 mg XL. He'll take Effexor and Wellbutrin in the morning. He'll continue taking Xanax 0.5 mg at night which helps him sleep. For now he'll continue low-dose imipramine 10 mg at night. At the incident significant dose and probably is no reason to make any changes with it. Today the patient did not come in with his wife. Is getting along well with her and seems to be a nonissue. The patient is doing well. He denies any chest pain these days. He has no shortness of breath. He is in pretty good shape. He denies any neurological symptoms.   Jerral Ralph, MD 01/28/2018, 9:33 AM [facility] MD Progress Note  01/28/2018 9:33 AM [name]  MRN:  [id number] Subjective:  Feels well Principal Problem: Major depression, chronic, mild/residual Diagnosis:  Major depression, recurrent residual Today the patient is doing fairly well. He's been having some medical problems. He recently went for  Thanksgiving to a length but unfortunately had a urinary tract infection. He also since then has been coughing and recently started on antibiotic. Emotionally he is fairly stable. He denies daily depression. He stays very active at work. He's been unable to exercise. The patient is sleeping well and  has an increase in his appetite. His energy is good. Is no problems thinking or concentrating. He is recently having a productive cough but is no shortness of breath. He denies any chest pain. Is a stable relationship with his wife. His kidneys are good. He has 4 grandchildren. Everybody in his home is good. He only issue is that a number mornings he awakens and he feels oversedated. It occurs point of his wife feels uncomfortable with him driving will taken to work. Today reviewed his medications and is evident that he likely needs reduction. He also notes that his memory has change in his and is good. 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 Patient Active Problem List   Diagnosis Date Noted  . Acute coronary syndrome ([place]) [I24.9] 08/30/2015  . Temporal arteritis ([place]) [M31.6] 06/16/2013  . Amaurosis fugax [G45.3] 06/01/2013  . Unspecified hypothyroidism [E03.9] 03/14/2013  . Other and unspecified hyperlipidemia [E78.5] 03/14/2013  . BPH (benign prostatic hypertrophy) [N40.0] 03/14/2013  . Hypogonadism, male [E29.1] 03/14/2013  . Erectile dysfunction [N52.9] 03/14/2013   Total Time spent with patient: 30 minutes  Past Psychiatric History:   Past Medical History:  Past Medical History:  Diagnosis Date  . Anxiety   . Arthritis    might be in back, no problems  . BPH (benign prostatic hyperplasia)   . Depression 1990s  . GERD (gastroesophageal reflux disease)    occasional  . Hypercholesteremia    controled  . Hypothyroid   . Shingles May 2014   "Right face, still has some"  . Temporal arteritis ([place])   . Transient blindness of both eyes     Past Surgical History:  Procedure Laterality Date  . ARTERY BIOPSY Right 06/20/2013   Procedure: BIOPSY TEMPORAL ARTERY RIGHT;  Surgeon: Earnstine Regal, MD;  [facility]: [facility];  Service: General;  Laterality: Right;  . CARDIAC CATHETERIZATION N/A 09/02/2015   Procedure: Left Heart Cath and  Coronary Angiography;  Surgeon: Charolette Forward, MD;  Location: [place] [facility];  Service: Cardiovascular;  Laterality: N/A;  . CARDIAC CATHETERIZATION N/A 09/02/2015   Procedure: Coronary Stent Intervention;  Surgeon: Charolette Forward, MD;  Location: [place] [facility];  Service: Cardiovascular;  Laterality: N/A;  1.  mid RCA      (3.0/28mm Xience) 2.  Mid LAD      (3.0/23mm Xience)  . NO PAST SURGERIES    . None     Family History:  Family History  Problem Relation Age of Onset  . Stroke Father        Died, 80s  . Stroke Sister        Living, 60  . Heart disease Mother        Died, 73  . Healthy Daughter   . Diabetes Maternal Grandmother   . Hypertension Neg Hx    Family Psychiatric  History:  Social History:  Social History   Substance and Sexual Activity  Alcohol Use Yes  . Alcohol/week: 1.2 oz  . Types: 2 Shots of liquor per week   Comment: socially     Social History   Substance and Sexual Activity  Drug Use  No    Social History   Socioeconomic History  . Marital status: Married    Spouse name: Not on file  . Number of children: Not on file  . Years of education: Not on file  . Highest education level: Not on file  Occupational History  . Not on file  Social Needs  . Financial resource strain: Not on file  . Food insecurity:    Worry: Not on file    Inability: Not on file  . Transportation needs:    Medical: Not on file    Non-medical: Not on file  Tobacco Use  . Smoking status: Former Smoker    Types: Cigarettes    Last attempt to quit: 08/24/1966    Years since quitting: 51.4  . Smokeless tobacco: Never Used  Substance and Sexual Activity  . Alcohol use: Yes    Alcohol/week: 1.2 oz    Types: 2 Shots of liquor per week    Comment: socially  . Drug use: No  . Sexual activity: Not on file  Lifestyle  . Physical activity:    Days per week: Not on file    Minutes per session: Not on file  . Stress: Not on file  Relationships  . Social  connections:    Talks on phone: Not on file    Gets together: Not on file    Attends religious service: Not on file    Active member of club or organization: Not on file    Attends meetings of clubs or organizations: Not on file    Relationship status: Not on file  Other Topics Concern  . Not on file  Social History Narrative   Currently works as a Midwife.  Lives with wife and they have two healthy daughters.   Additional Social History:                         Sleep: Good  Appetite:  Good  Current Medications: Current Outpatient Medications  Medication Sig Dispense Refill  . acetaminophen (TYLENOL) 500 MG tablet Take 500 mg by mouth daily as needed (pain).    [name] aspirin 81 MG chewable tablet Chew 1 tablet (81 mg total) by mouth daily. 30 tablet 3  . atorvastatin (LIPITOR) 40 MG tablet Take 1 tablet (40 mg total) by mouth daily at 6 PM. 30 tablet 3  . buPROPion (WELLBUTRIN XL) 150 MG 24 hr tablet Take 1 tablet (150 mg total) by mouth every morning. 30 tablet 6  . clobetasol cream (TEMOVATE) 0.05 % APPLY AS DIRECTED. 30 g 0  . hydrOXYzine (ATARAX/VISTARIL) 10 MG tablet TAKE 1 TABLET ONCE DAILY FOR ITCHING 30 tablet 0  . imipramine (TOFRANIL) 10 MG tablet 2 q hs 60 tablet 5  . levothyroxine (SYNTHROID, LEVOTHROID) 88 MCG tablet TAKE 1 TABLET BY MOUTH DAILY. 30 tablet 0  . metoprolol succinate (TOPROL-XL) 25 MG 24 hr tablet TAKE (1/2) TABLET DAILY. 30 tablet 0  . nitroGLYCERIN (NITROSTAT) 0.4 MG SL tablet Place 1 tablet (0.4 mg total) under the tongue every 5 (five) minutes as needed for chest pain (CP or SOB). 25 tablet 12  . ramipril (ALTACE) 1.25 MG capsule Take 1 capsule (1.25 mg total) by mouth daily. 30 capsule 3  . Syringe/Needle, Disp, (SYRINGE 3CC/25GX1-1/2") 25G X 1-1/2" 3 ML MISC 1 each by Does not apply route once a week. Use one syringe each week to inject testosterone 100 each 0  . tamsulosin (FLOMAX) 0.4  MG CAPS capsule TAKE (1) CAPSULE DAILY. (Patient  taking differently: TAKE (2) CAPSULES  DAILY AFTER SUPPER) 30 capsule 3  . testosterone cypionate (DEPOTESTOSTERONE CYPIONATE) 200 MG/ML injection Inject 0.5 mLs (100 mg total) into the muscle once a week. 10 mL 1  . venlafaxine XR (EFFEXOR-XR) 37.5 MG 24 hr capsule Take 1 capsule (37.5 mg total) by mouth daily. 30 capsule 5  . mupirocin ointment (BACTROBAN) 2 % Apply 1 application topically 2 (two) times daily. (Patient taking differently: Apply 1 application topically 2 (two) times daily. ) 22 g 0   No current facility-administered medications for this visit.     Lab Results:  No results found for this or any previous visit (from the past 48 hour(s)).  Physical Findings: AIMS:  , ,  ,  ,    CIWA:    COWS:     Musculoskeletal: Strength & Muscle Tone: within normal limits Gait & Station: normal Patient leans: Right  Psychiatric Specialty Exam: ROS  Blood pressure 118/70, pulse 78, height 5' 7.5" (1.715 m), weight 180 lb 3.2 oz (81.7 kg), SpO2 96 %.Body mass index is 27.81 kg/m.  General Appearance: Negative  Eye Contact::  Good  Speech:  Clear and Coherent  Volume:  Normal  Mood:  NA  Affect:  Appropriate  Thought Process:  Coherent  Orientation:  Full (Time, Place, and Person)  Thought Content:  WDL  Suicidal Thoughts:  No  Homicidal Thoughts:  No  Memory:  NA  Judgement:  Good  Insight:  Good  Psychomotor Activity:  Normal  Concentration:  Good  Recall:  Good  Fund of Knowledge:Good  Language: Good  Akathisia:  No  Handed:  Right  AIMS (if indicated):     Assets:  Desire for Improvement  ADL's:  Intact  Cognition: WNL  Sleep:      Treatment Plan Summary:At this time the patient will continue taking imipramine 10 mg at night and Xanax 0.5 mg. One of his problems with sleep or these medications she does well. Is not on it major problem is that of clinical depression. Again he has problems with compliance. She's taking [place] 150 mg XL all the complaints about  extensive. He does state. For reasons that are not clear he stopped his Effexor. Today he is agreed to restart his Effexor 75 mg XL each morning. Taking Effexor with [place] morning and he takes Xanax and imipramine night. He'll return to see me in 3 months for an opportunity to have some supportive psychotherapy. Presently the patient is not in therapy. I do not think this patient is suicidal at this time. Physically he is very stable. [facility] MD Progress Note  01/28/2018 9:33 AM Benjamin Hahn  MRN:  [id number] Subjective:  Feels well Principal Problem: Major depression, chronic, mild/residual Diagnosis:  Major depression, recurrent residual Today the patient is doing fairly well. He's been having some medical problems. He recently went for Thanksgiving to a length but unfortunately had a urinary tract infection. He also since then has been coughing and recently started on antibiotic. Emotionally he is fairly stable. He denies daily depression. He stays very active at work. He's been unable to exercise. The patient is sleeping well and has an increase in his appetite. His energy is good. Is no problems thinking or concentrating. He is recently having a productive cough but is no shortness of breath. He denies any chest pain. Is a stable relationship with his wife. His kidneys are good. He has 4  grandchildren. Everybody in his home is good. He only issue is that a number mornings he awakens and he feels oversedated. It occurs point of his wife feels uncomfortable with him driving will taken to work. Today reviewed his medications and is evident that he likely needs reduction. He also notes that his memory has change in his and is good. 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 Patient Active Problem List   Diagnosis Date Noted  . Acute coronary syndrome ([place]) [I24.9] 08/30/2015  . Temporal arteritis ([place]) [M31.6] 06/16/2013  . Amaurosis fugax [G45.3]  06/01/2013  . Unspecified hypothyroidism [E03.9] 03/14/2013  . Other and unspecified hyperlipidemia [E78.5] 03/14/2013  . BPH (benign prostatic hypertrophy) [N40.0] 03/14/2013  . Hypogonadism, male [E29.1] 03/14/2013  . Erectile dysfunction [N52.9] 03/14/2013   Total Time spent with patient: 30 minutes  Past Psychiatric History:   Past Medical History:  Past Medical History:  Diagnosis Date  . Anxiety   . Arthritis    might be in back, no problems  . BPH (benign prostatic hyperplasia)   . Depression 1990s  . GERD (gastroesophageal reflux disease)    occasional  . Hypercholesteremia    controled  . Hypothyroid   . Shingles May 2014   "Right face, still has some"  . Temporal arteritis ([place])   . Transient blindness of both eyes     Past Surgical History:  Procedure Laterality Date  . ARTERY BIOPSY Right 06/20/2013   Procedure: BIOPSY TEMPORAL ARTERY RIGHT;  Surgeon: Earnstine Regal, MD;  [facility];  Service: General;  Laterality: Right;  . CARDIAC CATHETERIZATION N/A 09/02/2015   Procedure: Left Heart Cath and Coronary Angiography;  Surgeon: Charolette Forward, MD;  Location: [place] [facility];  Service: Cardiovascular;  Laterality: N/A;  . CARDIAC CATHETERIZATION N/A 09/02/2015   Procedure: Coronary Stent Intervention;  Surgeon: Charolette Forward, MD;  Location: [place] [facility];  Service: Cardiovascular;  Laterality: N/A;  1.  mid RCA      (3.0/28mm Xience) 2.  Mid LAD      (3.0/23mm Xience)  . NO PAST SURGERIES    . None     Family History:  Family History  Problem Relation Age of Onset  . Stroke Father        Died, 80s  . Stroke Sister        Living, 20  . Heart disease Mother        Died, 10  . Healthy Daughter   . Diabetes Maternal Grandmother   . Hypertension Neg Hx    Family Psychiatric  History:  Social History:  Social History   Substance and Sexual Activity  Alcohol Use Yes  . Alcohol/week: 1.2 oz  . Types: 2 Shots of liquor per week   Comment:  socially     Social History   Substance and Sexual Activity  Drug Use No    Social History   Socioeconomic History  . Marital status: Married    Spouse name: Not on file  . Number of children: Not on file  . Years of education: Not on file  . Highest education level: Not on file  Occupational History  . Not on file  Social Needs  . Financial resource strain: Not on file  . Food insecurity:    Worry: Not on file    Inability: Not on file  . Transportation needs:    Medical: Not on file  Non-medical: Not on file  Tobacco Use  . Smoking status: Former Smoker    Types: Cigarettes    Last attempt to quit: 08/24/1966    Years since quitting: 51.4  . Smokeless tobacco: Never Used  Substance and Sexual Activity  . Alcohol use: Yes    Alcohol/week: 1.2 oz    Types: 2 Shots of liquor per week    Comment: socially  . Drug use: No  . Sexual activity: Not on file  Lifestyle  . Physical activity:    Days per week: Not on file    Minutes per session: Not on file  . Stress: Not on file  Relationships  . Social connections:    Talks on phone: Not on file    Gets together: Not on file    Attends religious service: Not on file    Active member of club or organization: Not on file    Attends meetings of clubs or organizations: Not on file    Relationship status: Not on file  Other Topics Concern  . Not on file  Social History Narrative   Currently works as a Midwife.  Lives with wife and they have two healthy daughters.   Additional Social History:                         Sleep: Good  Appetite:  Good  Current Medications: Current Outpatient Medications  Medication Sig Dispense Refill  . acetaminophen (TYLENOL) 500 MG tablet Take 500 mg by mouth daily as needed (pain).    [name] aspirin 81 MG chewable tablet Chew 1 tablet (81 mg total) by mouth daily. 30 tablet 3  . atorvastatin (LIPITOR) 40 MG tablet Take 1 tablet (40 mg total) by mouth daily at 6 PM. 30  tablet 3  . buPROPion (WELLBUTRIN XL) 150 MG 24 hr tablet Take 1 tablet (150 mg total) by mouth every morning. 30 tablet 6  . clobetasol cream (TEMOVATE) 0.05 % APPLY AS DIRECTED. 30 g 0  . hydrOXYzine (ATARAX/VISTARIL) 10 MG tablet TAKE 1 TABLET ONCE DAILY FOR ITCHING 30 tablet 0  . imipramine (TOFRANIL) 10 MG tablet 2 q hs 60 tablet 5  . levothyroxine (SYNTHROID, LEVOTHROID) 88 MCG tablet TAKE 1 TABLET BY MOUTH DAILY. 30 tablet 0  . metoprolol succinate (TOPROL-XL) 25 MG 24 hr tablet TAKE (1/2) TABLET DAILY. 30 tablet 0  . nitroGLYCERIN (NITROSTAT) 0.4 MG SL tablet Place 1 tablet (0.4 mg total) under the tongue every 5 (five) minutes as needed for chest pain (CP or SOB). 25 tablet 12  . ramipril (ALTACE) 1.25 MG capsule Take 1 capsule (1.25 mg total) by mouth daily. 30 capsule 3  . Syringe/Needle, Disp, (SYRINGE 3CC/25GX1-1/2") 25G X 1-1/2" 3 ML MISC 1 each by Does not apply route once a week. Use one syringe each week to inject testosterone 100 each 0  . tamsulosin (FLOMAX) 0.4 MG CAPS capsule TAKE (1) CAPSULE DAILY. (Patient taking differently: TAKE (2) CAPSULES  DAILY AFTER SUPPER) 30 capsule 3  . testosterone cypionate (DEPOTESTOSTERONE CYPIONATE) 200 MG/ML injection Inject 0.5 mLs (100 mg total) into the muscle once a week. 10 mL 1  . venlafaxine XR (EFFEXOR-XR) 37.5 MG 24 hr capsule Take 1 capsule (37.5 mg total) by mouth daily. 30 capsule 5  . mupirocin ointment (BACTROBAN) 2 % Apply 1 application topically 2 (two) times daily. (Patient taking differently: Apply 1 application topically 2 (two) times daily. ) 22 g 0  No current facility-administered medications for this visit.     Lab Results:  No results found for this or any previous visit (from the past 48 hour(s)).  Physical Findings: AIMS:  , ,  ,  ,    CIWA:    COWS:     Musculoskeletal: Strength & Muscle Tone: within normal limits Gait & Station: normal Patient leans: Right  Psychiatric Specialty Exam: ROS  Blood  pressure 118/70, pulse 78, height 5' 7.5" (1.715 m), weight 180 lb 3.2 oz (81.7 kg), SpO2 96 %.Body mass index is 27.81 kg/m.  General Appearance: Negative  Eye Contact::  Good  Speech:  Clear and Coherent  Volume:  Normal  Mood:  NA  Affect:  Appropriate  Thought Process:  Coherent  Orientation:  Full (Time, Place, and Person)  Thought Content:  WDL  Suicidal Thoughts:  No  Homicidal Thoughts:  No  Memory:  NA  Judgement:  Good  Insight:  Good  Psychomotor Activity:  Normal  Concentration:  Good  Recall:  Good  Fund of Knowledge:Good  Language: Good  Akathisia:  No  Handed:  Right  AIMS (if indicated):     Assets:  Desire for Improvement  ADL's:  Intact  Cognition: WNL  Sleep:      Treatment Plan Summary: At this time the patient is agreed to allow me to prescribe all his psychiatric medications for now continue taking Effexor 37.5 mg. We will actually increase his imipramine from a dose of 10 mg July higher dose of 20 mg. Hopefully this will help him fall off to sleep better. He's been taking it for years and has no adverse side effects. We've asked him to start taking melatonin and he insists that he needs to take 10 mg if he's going to. He notes his wife takes only 5 this is reasonable and I'm not concerned about it. Therefore he'll take no benzodiazepines will continue taking 37.5 mg of Effexor and continue taking Wellbutrin 150 mg XL. He'll increase his imipramine to a dose of 20 mg. He'll reduce his alcohol 1 or 2 drinks and he'll return to see me in 3 months for a 30 minute visit.

## 2018-02-01 ENCOUNTER — Telehealth: Payer: Self-pay | Admitting: Endocrinology

## 2018-02-01 NOTE — Telephone Encounter (Signed)
Patient wants to set up testosterone injection appointments for every 2 weeks. He will bring his medication. Please advise. Patient's ph# [phone]. He prefers early morning or late afternoon.

## 2018-02-02 ENCOUNTER — Other Ambulatory Visit: Payer: Self-pay

## 2018-02-02 MED ORDER — TESTOSTERONE CYPIONATE 200 MG/ML IM SOLN: INTRAMUSCULAR | 3 refills | Status: DC

## 2018-02-02 NOTE — Telephone Encounter (Signed)
He can take the injection, 200 mg every 2 weeks.  However not clear why he is not going to the urologist which he had wanted

## 2018-02-02 NOTE — Telephone Encounter (Signed)
Pt would like to know if you could dose his testosterone to be Q 2 weeks instead of weekly. He would prefer this.

## 2018-02-02 NOTE — Telephone Encounter (Signed)
Pt put on nurse schedule for 02/03/2018 and sig changed to give injection Q two weeks.

## 2018-02-03 ENCOUNTER — Telehealth: Payer: Self-pay

## 2018-02-03 ENCOUNTER — Other Ambulatory Visit: Payer: Self-pay

## 2018-02-03 ENCOUNTER — Ambulatory Visit (INDEPENDENT_AMBULATORY_CARE_PROVIDER_SITE_OTHER): Payer: Medicare Other

## 2018-02-03 DIAGNOSIS — E291 Testicular hypofunction: Secondary | ICD-10-CM | POA: Diagnosis not present

## 2018-02-03 MED ORDER — BENZONATATE 100 MG PO CAPS: 100.0000 mg | ORAL_CAPSULE | Freq: Three times a day (TID) | ORAL | 0 refills | Status: DC | PRN

## 2018-02-03 MED ORDER — TESTOSTERONE CYPIONATE 200 MG/ML IM SOLN
200.0000 mg | INTRAMUSCULAR | Status: DC
  Administered 2018-02-03: 200 mg via INTRAMUSCULAR

## 2018-02-03 NOTE — Telephone Encounter (Signed)
He can have 100 mg 3 times daily

## 2018-02-03 NOTE — Telephone Encounter (Signed)
Pt stated that he would like you to order Benzonatate for a cough that is developing. If you okay this, please specify dose and frequency.

## 2018-02-03 NOTE — Telephone Encounter (Signed)
Medication sent to pharmacy. Sent for 10 day supply.

## 2018-02-08 ENCOUNTER — Telehealth: Payer: Self-pay

## 2018-02-08 ENCOUNTER — Encounter: Payer: Self-pay | Admitting: Internal Medicine

## 2018-02-08 ENCOUNTER — Telehealth: Payer: Self-pay | Admitting: Endocrinology

## 2018-02-08 ENCOUNTER — Ambulatory Visit (INDEPENDENT_AMBULATORY_CARE_PROVIDER_SITE_OTHER)
Admission: RE | Admit: 2018-02-08 | Discharge: 2018-02-08 | Disposition: A | Discharge status: Home / Self Care | Payer: Medicare Other | Source: Ambulatory Visit | Attending: Internal Medicine | Admitting: Internal Medicine

## 2018-02-08 ENCOUNTER — Ambulatory Visit (INDEPENDENT_AMBULATORY_CARE_PROVIDER_SITE_OTHER): Payer: Medicare Other | Admitting: Internal Medicine

## 2018-02-08 VITALS — BP 108/68 | HR 88 | Temp 99.2°F | Resp 20 | Ht 67.5 in | Wt 183.0 lb

## 2018-02-08 DIAGNOSIS — R05 Cough: Secondary | ICD-10-CM | POA: Diagnosis not present

## 2018-02-08 DIAGNOSIS — J988 Other specified respiratory disorders: Secondary | ICD-10-CM | POA: Diagnosis not present

## 2018-02-08 DIAGNOSIS — R059 Cough, unspecified: Secondary | ICD-10-CM | POA: Insufficient documentation

## 2018-02-08 DIAGNOSIS — J4521 Mild intermittent asthma with (acute) exacerbation: Secondary | ICD-10-CM

## 2018-02-08 LAB — POCT EXHALED NITRIC OXIDE: FeNO level (ppb): 21

## 2018-02-08 MED ORDER — HYDROCODONE-HOMATROPINE 5-1.5 MG/5ML PO SYRP: 5.0000 mL | ORAL_SOLUTION | Freq: Three times a day (TID) | ORAL | 0 refills | Status: DC | PRN

## 2018-02-08 MED ORDER — AMOXICILLIN-POT CLAVULANATE 875-125 MG PO TABS: 1.0000 | ORAL_TABLET | Freq: Two times a day (BID) | ORAL | 0 refills | Status: DC

## 2018-02-08 MED ORDER — IPRATROPIUM-ALBUTEROL 20-100 MCG/ACT IN AERS: 1.0000 | INHALATION_SPRAY | Freq: Four times a day (QID) | RESPIRATORY_TRACT | 5 refills | Status: DC | PRN

## 2018-02-08 MED ORDER — PREDNISONE 50 MG PO TABS: 50.0000 mg | ORAL_TABLET | Freq: Every day | ORAL | 0 refills | Status: AC

## 2018-02-08 MED ORDER — IPRATROPIUM-ALBUTEROL 0.5-2.5 (3) MG/3ML IN SOLN
3.0000 mL | Freq: Once | RESPIRATORY_TRACT | Status: AC
  Administered 2018-02-08: 3 mL via RESPIRATORY_TRACT

## 2018-02-08 NOTE — Telephone Encounter (Signed)
Benjamin Hahn, I am not comfortable prescribing this for him without ever seeing him.  However, since I am not doing primary care, he will need to call [place] office and see 1 of the doctors there.  We discussed in the past that if her primary care issue arises in [facility] and I am the only one here, they should call our [place] site and try to be seen by primary care there.  I am sorry I cannot help.

## 2018-02-08 NOTE — Telephone Encounter (Addendum)
He will be seeing Dr. Ronnald Ramp at the [place] office at 2 today

## 2018-02-08 NOTE — Telephone Encounter (Signed)
Patient called Benjamin Hahn and stated that they could not see patient .

## 2018-02-08 NOTE — Progress Notes (Signed)
Subjective:  Patient ID: Benjamin Hahn, male    DOB: 1933-02-04  Age: 84 y.o. MRN: [id number]  CC: Cough  NEW TO ME  HPI Benjamin Hahn presents for a 3-day history of cough productive of thick green phlegm with low-grade fever, sore throat, shortness of breath, and wheezing.  He tells me he has a history of asthma.  He is a never smoker.  He denies chest pain, night sweats, or hemoptysis.  He tells me the cough was so severe last night that it kept him awake throughout the night.  He has tried Tessalon to control the cough but it was not helpful.  Outpatient Medications Prior to Visit  Medication Sig Dispense Refill  . acetaminophen (TYLENOL) 500 MG tablet Take 500 mg by mouth daily as needed (pain).    [name] aspirin 81 MG chewable tablet Chew 1 tablet (81 mg total) by mouth daily. 30 tablet 3  . atorvastatin (LIPITOR) 40 MG tablet Take 1 tablet (40 mg total) by mouth daily at 6 PM. 30 tablet 3  . buPROPion (WELLBUTRIN XL) 150 MG 24 hr tablet Take 1 tablet (150 mg total) by mouth every morning. 30 tablet 6  . clobetasol cream (TEMOVATE) 0.05 % APPLY AS DIRECTED. 30 g 0  . hydrOXYzine (ATARAX/VISTARIL) 10 MG tablet TAKE 1 TABLET ONCE DAILY FOR ITCHING 30 tablet 0  . imipramine (TOFRANIL) 10 MG tablet 2 q hs 60 tablet 5  . levothyroxine (SYNTHROID, LEVOTHROID) 88 MCG tablet TAKE 1 TABLET BY MOUTH DAILY. 30 tablet 0  . metoprolol succinate (TOPROL-XL) 25 MG 24 hr tablet TAKE (1/2) TABLET DAILY. 30 tablet 0  . mupirocin ointment (BACTROBAN) 2 % Apply 1 application topically 2 (two) times daily. (Patient taking differently: Apply 1 application topically 2 (two) times daily. ) 22 g 0  . nitroGLYCERIN (NITROSTAT) 0.4 MG SL tablet Place 1 tablet (0.4 mg total) under the tongue every 5 (five) minutes as needed for chest pain (CP or SOB). 25 tablet 12  . ramipril (ALTACE) 1.25 MG capsule Take 1 capsule (1.25 mg total) by mouth daily. 30 capsule 3  . Syringe/Needle, Disp, (SYRINGE  3CC/25GX1-1/2") 25G X 1-1/2" 3 ML MISC 1 each by Does not apply route once a week. Use one syringe each week to inject testosterone 100 each 0  . tamsulosin (FLOMAX) 0.4 MG CAPS capsule TAKE (1) CAPSULE DAILY. (Patient taking differently: TAKE (2) CAPSULES  DAILY AFTER SUPPER) 30 capsule 3  . testosterone cypionate (DEPOTESTOSTERONE CYPIONATE) 200 MG/ML injection Inject 1.14mLs (200mg  total) into the muscle once every two weeks. 10 mL 3  . venlafaxine XR (EFFEXOR-XR) 37.5 MG 24 hr capsule Take 1 capsule (37.5 mg total) by mouth daily. 30 capsule 5  . benzonatate (TESSALON) 100 MG capsule Take 1 capsule (100 mg total) by mouth 3 (three) times daily as needed for cough. 30 capsule 0   Facility-Administered Medications Prior to Visit  Medication Dose Route Frequency Provider Last Rate Last Dose  . testosterone cypionate (DEPOTESTOSTERONE CYPIONATE) injection 200 mg  200 mg Intramuscular Q14 Days Elayne Snare, MD   200 mg at 02/03/18 1527    ROS Review of Systems  Constitutional: Positive for fatigue. Negative for chills and diaphoresis.  HENT: Positive for sore throat. Negative for trouble swallowing.   Eyes: Negative for visual disturbance.  Respiratory: Positive for cough, shortness of breath and wheezing. Negative for chest tightness and stridor.   Cardiovascular: Negative for chest pain, palpitations and leg swelling.  Gastrointestinal: Negative for  abdominal pain, constipation, diarrhea, nausea and vomiting.  Endocrine: Negative.   Genitourinary: Negative.   Musculoskeletal: Positive for myalgias. Negative for arthralgias.  Skin: Negative.  Negative for rash.  Allergic/Immunologic: Negative.   Neurological: Negative.  Negative for dizziness, weakness and light-headedness.  Hematological: Negative for adenopathy. Does not bruise/bleed easily.  Psychiatric/Behavioral: Negative.     Objective:  BP 108/68 (BP Location: Left Arm, Patient Position: Sitting, Cuff Size: Normal)   Pulse 88    Temp 99.2 F (37.3 C) (Oral)   Resp 20   Ht 5' 7.5" (1.715 m)   Wt 183 lb (83 kg)   SpO2 93%   BMI 28.24 kg/m   BP Readings from Last 3 Encounters:  02/08/18 108/68  01/26/18 118/60  01/23/18 109/68    Wt Readings from Last 3 Encounters:  02/08/18 183 lb (83 kg)  01/26/18 181 lb 3.2 oz (82.2 kg)  01/06/18 181 lb 3.2 oz (82.2 kg)    Physical Exam  Constitutional: He is oriented to person, place, and time.  Non-toxic appearance. He does not have a sickly appearance. He does not appear ill. No distress.  HENT:  Mouth/Throat: Oropharynx is clear and moist. No oropharyngeal exudate.  Eyes: Conjunctivae are normal. No scleral icterus.  Neck: Normal range of motion. Neck supple. No JVD present. No thyromegaly present.  Cardiovascular: Normal rate, regular rhythm and normal heart sounds. Exam reveals no friction rub.  No murmur heard. Pulmonary/Chest: No stridor. No tachypnea. No respiratory distress. He has no decreased breath sounds. He has no wheezes. He has rhonchi in the right upper field, the right middle field, the right lower field, the left upper field, the left middle field and the left lower field. He has no rales.  He has late inspiratory rhonchi and diffuse expiratory rhonchi.  There is good air movement.  He received a nebulized treatment of albuterol and ipratropium and the rhonchi diminished by about 50%.  Abdominal: Soft. Normal appearance and bowel sounds are normal. He exhibits no mass. There is no hepatosplenomegaly. There is no tenderness.  Musculoskeletal: Normal range of motion. He exhibits no edema, tenderness or deformity.  Lymphadenopathy:    He has no cervical adenopathy.  Neurological: He is alert and oriented to person, place, and time.  Skin: Skin is warm and dry. No rash noted. He is not diaphoretic.  Vitals reviewed.   Lab Results  Component Value Date   WBC 7.6 01/23/2018   HGB 14.0 01/23/2018   HCT 42.7 01/23/2018   PLT 208 01/23/2018    GLUCOSE 96 01/23/2018   CHOL 108 11/01/2017   TRIG 48.0 11/01/2017   HDL 55.90 11/01/2017   LDLCALC 42 11/01/2017   ALT 20 01/23/2018   AST 20 01/23/2018   NA 138 01/23/2018   K 4.3 01/23/2018   CL 104 01/23/2018   CREATININE 1.38 (H) 01/23/2018   BUN 20 01/23/2018   CO2 27 01/23/2018   TSH 2.77 11/01/2017   INR 3.18 (H) 09/02/2015   HGBA1C 5.9 11/01/2017    Ct Abdomen Pelvis W Contrast  Result Date: 01/23/2018 CLINICAL DATA:  Rectal bleeding. EXAM: CT ABDOMEN AND PELVIS WITH CONTRAST TECHNIQUE: Multidetector CT imaging of the abdomen and pelvis was performed using the standard protocol following bolus administration of intravenous contrast. CONTRAST:  186mL OMNIPAQUE IOHEXOL 300 MG/ML  SOLN COMPARISON:  Chest CT-08/31/2015; 09/09/2013 FINDINGS: Lower chest: Limited visualization of the lower thorax demonstrates improved aeration of the right lung base with minimal residual centrally calcified nodular opacity within  the right lung base now measuring approximately 1.9 x 1.4 cm (image 14, series 4), previously, 4.7 x 2.0 cm. Adjacent linear heterogeneous opacities in volume loss, also improved compared to the 08/2015 examination. Subsegmental atelectasis within the caudal aspect of the right middle lobe is unchanged. Punctate (approximately 3 mm) nodule within the left costophrenic angle is unchanged compared to the 08/2013 examination and thus of benign etiology. Normal heart size. Coronary artery calcifications. No pericardial effusion. Hepatobiliary: Normal hepatic contour. Note is made of an approximately 1.5 cm hypoattenuating (-9 Hounsfield unit) cyst within the subcapsular aspect of the posterior segment of the right lobe of the liver (image 17, series 3). Punctate (approximately 4 mm) hypoattenuating lesion within the medial segment of left lobe of the liver (image 21, series 3) is too small to adequately characterize of favored to represent additional hepatic cysts. Normal appearance of the  gallbladder given degree distention. No radiopaque gallstones. No intra extrahepatic bili duct dilatation. No ascites. Pancreas: Normal appearance of the pancreas Spleen: Normal appearance of the spleen Adrenals/Urinary Tract: There is symmetric enhancement and excretion of the bilateral kidneys. Note is made of an approximately 1.1 cm hypoattenuating nonenhancing partially exophytic cyst arising from the posterosuperior aspect of the left kidney (image 29, series 3). Additional subcentimeter hypoattenuating bilateral renal lesions are too small to adequately characterize of favored to represent additional renal cysts. No definite renal stones on this postcontrast examination. There is a minimal amount of grossly symmetric bilateral age and body habitus related perinephric stranding. No urine obstruction. Normal appearance the bilateral adrenal glands. There is mild thickening of the urinary bladder wall. Note is made of a approximately 2.8 x 2.0 x 3.6 cm diverticulum arising from the posterior aspect of the base of the urinary bladder (axial image 69, series 3, sagittal image 85, series 7). Stomach/Bowel: There is apparent circumferential wall thickening involving the descending colon (representative axial images 18, 27 and 49; coronal images 43, 54 and 72, series 6). Scattered colonic diverticulosis without evidence of diverticulitis. No evidence of enteric obstruction. Normal appearance of the terminal ileum and retrocecal appendix. No pneumoperitoneum, pneumatosis or portal venous gas. Vascular/Lymphatic: Mixed calcified and noncalcified atherosclerotic plaque throughout the abdominal aorta, not resulting in hemodynamically significant stenosis. Major branch vessels of the abdominal aorta appear patent on this non CTA examination. Scattered retroperitoneal and mesenteric lymph nodes are numerous though individually not enlarged by size criteria. No bulky retroperitoneal, mesenteric, pelvic or inguinal  lymphadenopathy. Reproductive: The prostate is markedly enlarged with mass effect upon the undersurface of the urinary bladder measuring at least 6.2 x 5.9 x 8.1 cm (axial image 75, series 3, sagittal image 85, series 7). Other: Mild diffuse body wall anasarca. Small mesenteric fat containing left-sided direct inguinal hernia. Musculoskeletal: No acute or aggressive osseous abnormalities. Stigmata of DISH throughout the thoracolumbar spine. Mild-to-moderate multilevel lumbar spine DDD, worse at L4-L5 with disc space height loss, endplate irregularity and sclerosis. Bilateral facet degenerative change within the lower lumbar spine. IMPRESSION: 1. Apparent circumferential wall thickening involving the descending colon, potentially accentuated due to underdistention though a enteritis could have a similar appearance. Clinical correlation is advised. No evidence of enteric obstruction. 2. Scattered colonic diverticulosis without evidence of diverticulitis. 3. Marked prostatomegaly with mass effect upon the undersurface of the urinary bladder. There is mild diffuse thickening of the urinary bladder wall with associated approximately 3.6 cm urinary bladder diverticulum - constellation of findings suggestive of chronic urinary bladder outlet obstruction. If not recently performed, further evaluation with  DRE is advised. 4. Improved aeration of the right lung base with minimal residual centrally calcified nodular opacity currently measuring 1.9 cm, previously, 4.7 cm, favored to represent a small area atelectasis/scar/rounded atelectasis. 5.  Aortic Atherosclerosis (ICD10-I70.0). Electronically Signed   By: Sandi Mariscal M.D.   On: 01/23/2018 14:13    Assessment & Plan:   Froylan was seen today for cough.  Diagnoses and all orders for this visit:  Cough-chest x-ray is negative for mass, infiltrate, or edema. -     DG Chest 2 View; Future -     POCT EXHALED NITRIC OXIDE -     ipratropium-albuterol (DUONEB) 0.5-2.5  (3) MG/3ML nebulizer solution 3 mL -     HYDROcodone-homatropine (HYCODAN) 5-1.5 MG/5ML syrup; Take 5 mLs by mouth every 8 (eight) hours as needed for cough.  Mild intermittent asthma with acute exacerbation- He is having a moderately severe exacerbation of asthma.  His FeNO score is mildly elevated.  I have asked him to start using a SABA/LAMA inhaler and to take a 5-day course of prednisone. -     predniSONE (DELTASONE) 50 MG tablet; Take 1 tablet (50 mg total) by mouth daily with breakfast for 5 days. -     Ipratropium-Albuterol (COMBIVENT RESPIMAT) 20-100 MCG/ACT AERS respimat; Inhale 1 puff into the lungs every 6 (six) hours as needed for wheezing.  RTI (respiratory tract infection)- I will treat the infection with Augmentin.  Will upgrade his cough suppressant to Hycodan. -     amoxicillin-clavulanate (AUGMENTIN) 875-125 MG tablet; Take 1 tablet by mouth 2 (two) times daily. -     HYDROcodone-homatropine (HYCODAN) 5-1.5 MG/5ML syrup; Take 5 mLs by mouth every 8 (eight) hours as needed for cough.   I have discontinued Cristopher Estimable. Grandinetti's benzonatate. I am also having him start on amoxicillin-clavulanate, HYDROcodone-homatropine, predniSONE, and Ipratropium-Albuterol. Additionally, I am having him maintain his tamsulosin, acetaminophen, aspirin, atorvastatin, nitroGLYCERIN, ramipril, clobetasol cream, mupirocin ointment, SYRINGE 3CC/25GX1-1/2", hydrOXYzine, metoprolol succinate, levothyroxine, imipramine, venlafaxine XR, buPROPion, and testosterone cypionate. We administered ipratropium-albuterol. We will continue to administer testosterone cypionate.  Meds ordered this encounter  Medications  . ipratropium-albuterol (DUONEB) 0.5-2.5 (3) MG/3ML nebulizer solution 3 mL  . amoxicillin-clavulanate (AUGMENTIN) 875-125 MG tablet    Sig: Take 1 tablet by mouth 2 (two) times daily.    Dispense:  20 tablet    Refill:  0  . HYDROcodone-homatropine (HYCODAN) 5-1.5 MG/5ML syrup    Sig: Take 5 mLs by  mouth every 8 (eight) hours as needed for cough.    Dispense:  120 mL    Refill:  0  . predniSONE (DELTASONE) 50 MG tablet    Sig: Take 1 tablet (50 mg total) by mouth daily with breakfast for 5 days.    Dispense:  5 tablet    Refill:  0  . Ipratropium-Albuterol (COMBIVENT RESPIMAT) 20-100 MCG/ACT AERS respimat    Sig: Inhale 1 puff into the lungs every 6 (six) hours as needed for wheezing.    Dispense:  4 g    Refill:  5     Follow-up: Return in about 3 days (around 02/11/2018).  Scarlette Calico, MD

## 2018-02-08 NOTE — Telephone Encounter (Signed)
error 

## 2018-02-08 NOTE — Patient Instructions (Signed)
Cough, Adult  Coughing is a reflex that clears your throat and your airways. Coughing helps to heal and protect your lungs. It is normal to cough occasionally, but a cough that happens with other symptoms or lasts a long time may be a sign of a condition that needs treatment. A cough may last only 2-3 weeks (acute), or it may last longer than 8 weeks (chronic).  What are the causes?  Coughing is commonly caused by:   Breathing in substances that irritate your lungs.   A viral or bacterial respiratory infection.   Allergies.   Asthma.   Postnasal drip.   Smoking.   Acid backing up from the stomach into the esophagus (gastroesophageal reflux).   Certain medicines.   Chronic lung problems, including COPD (or rarely, lung cancer).   Other medical conditions such as heart failure.    Follow these instructions at home:  Pay attention to any changes in your symptoms. Take these actions to help with your discomfort:   Take medicines only as told by your health care provider.  ? If you were prescribed an antibiotic medicine, take it as told by your health care provider. Do not stop taking the antibiotic even if you start to feel better.  ? Talk with your health care provider before you take a cough suppressant medicine.   Drink enough fluid to keep your urine clear or pale yellow.   If the air is dry, use a cold steam vaporizer or humidifier in your bedroom or your home to help loosen secretions.   Avoid anything that causes you to cough at work or at home.   If your cough is worse at night, try sleeping in a semi-upright position.   Avoid cigarette smoke. If you smoke, quit smoking. If you need help quitting, ask your health care provider.   Avoid caffeine.   Avoid alcohol.   Rest as needed.    Contact a health care provider if:   You have new symptoms.   You cough up pus.   Your cough does not get better after 2-3 weeks, or your cough gets worse.   You cannot control your cough with suppressant  medicines and you are losing sleep.   You develop pain that is getting worse or pain that is not controlled with pain medicines.   You have a fever.   You have unexplained weight loss.   You have night sweats.  Get help right away if:   You cough up blood.   You have difficulty breathing.   Your heartbeat is very fast.  This information is not intended to replace advice given to you by your health care provider. Make sure you discuss any questions you have with your health care provider.  Document Released: 02/06/2011 Document Revised: 01/16/2016 Document Reviewed: 10/17/2014  Elsevier Interactive Patient Education  2018 [employer]

## 2018-02-08 NOTE — Telephone Encounter (Signed)
Dr. Loanne Drilling, last week when pt came in for a nurse visit, the patient requested Tesslon Perle for cough. Dr. Dwyane Dee gave this to pt. He is now requesting something addisonal, (prednisone). Could you please advise in Dr. Ronnie Derby absence? [facility] stated that because he has never been to their location, he would be treated as a new patient and cannot be seen today. Thank you!

## 2018-02-08 NOTE — Telephone Encounter (Signed)
Please advise urgent care

## 2018-02-08 NOTE — Telephone Encounter (Signed)
Patient called today because he has a deep chest cough and is requesting prednisone which Dr. Dwyane Dee gives to him when this occurs. Patient reported deep cough, with green and grey sputum ann has not been able to sleep-please advise in Dr. Ronnie Derby absence

## 2018-02-08 NOTE — Telephone Encounter (Signed)
Patient called back and I advised him of message that Dr Cruzita Lederer wrote. He stated he understood and would call [facility].  Thanks!

## 2018-02-08 NOTE — Telephone Encounter (Signed)
Thank you, Benjamin Hahn!

## 2018-02-11 NOTE — Progress Notes (Signed)
Per orders of Dr.Kumar injection of Testosterone given today by N.Mincy,LPN . Patient tolerated injection well.

## 2018-02-14 ENCOUNTER — Other Ambulatory Visit: Payer: Self-pay | Admitting: Endocrinology

## 2018-02-17 ENCOUNTER — Ambulatory Visit (INDEPENDENT_AMBULATORY_CARE_PROVIDER_SITE_OTHER): Payer: Medicare Other

## 2018-02-17 DIAGNOSIS — E291 Testicular hypofunction: Secondary | ICD-10-CM | POA: Diagnosis not present

## 2018-02-17 MED ORDER — TESTOSTERONE CYPIONATE 200 MG/ML IM SOLN
100.0000 mg | INTRAMUSCULAR | Status: DC
  Administered 2018-02-17: 100 mg via INTRAMUSCULAR

## 2018-02-17 NOTE — Progress Notes (Addendum)
Per orders of Dr. Dwyane Dee injection of Testosterone 200mg  given IM today by N.Rito Lecomte,LPN. And administered in left ventrogluteal. Patient tolerated injection well and did not voice any c/o pain or discomfort associated with injection.

## 2018-02-19 ENCOUNTER — Other Ambulatory Visit: Payer: Self-pay | Admitting: Endocrinology

## 2018-03-03 ENCOUNTER — Ambulatory Visit (INDEPENDENT_AMBULATORY_CARE_PROVIDER_SITE_OTHER): Payer: Medicare Other

## 2018-03-03 DIAGNOSIS — E291 Testicular hypofunction: Secondary | ICD-10-CM | POA: Diagnosis not present

## 2018-03-03 MED ORDER — TESTOSTERONE CYPIONATE 200 MG/ML IM SOLN
200.0000 mg | INTRAMUSCULAR | Status: DC
  Administered 2018-03-03 – 2018-03-17 (×2): 200 mg via INTRAMUSCULAR

## 2018-03-03 NOTE — Progress Notes (Signed)
Per orders of Dr. Dwyane Dee injection of Testosterone 200mg  given today by N.Lilo Wallington,LPN . Patient tolerated injection well.

## 2018-03-08 ENCOUNTER — Other Ambulatory Visit: Payer: Self-pay | Admitting: Endocrinology

## 2018-03-08 ENCOUNTER — Other Ambulatory Visit (INDEPENDENT_AMBULATORY_CARE_PROVIDER_SITE_OTHER): Payer: Medicare Other

## 2018-03-08 DIAGNOSIS — H25043 Posterior subcapsular polar age-related cataract, bilateral: Secondary | ICD-10-CM | POA: Diagnosis not present

## 2018-03-08 DIAGNOSIS — H18413 Arcus senilis, bilateral: Secondary | ICD-10-CM | POA: Diagnosis not present

## 2018-03-08 DIAGNOSIS — H25013 Cortical age-related cataract, bilateral: Secondary | ICD-10-CM | POA: Diagnosis not present

## 2018-03-08 DIAGNOSIS — E291 Testicular hypofunction: Secondary | ICD-10-CM | POA: Diagnosis not present

## 2018-03-08 DIAGNOSIS — H2513 Age-related nuclear cataract, bilateral: Secondary | ICD-10-CM | POA: Diagnosis not present

## 2018-03-08 DIAGNOSIS — H40013 Open angle with borderline findings, low risk, bilateral: Secondary | ICD-10-CM | POA: Diagnosis not present

## 2018-03-08 LAB — TESTOSTERONE: Testosterone: 678.1 ng/dL (ref 300.00–890.00)

## 2018-03-10 ENCOUNTER — Encounter: Payer: Self-pay | Admitting: Endocrinology

## 2018-03-10 ENCOUNTER — Ambulatory Visit (INDEPENDENT_AMBULATORY_CARE_PROVIDER_SITE_OTHER): Payer: Medicare Other | Admitting: Endocrinology

## 2018-03-10 VITALS — BP 118/60 | HR 86 | Ht 67.5 in | Wt 184.6 lb

## 2018-03-10 DIAGNOSIS — E7800 Pure hypercholesterolemia, unspecified: Secondary | ICD-10-CM | POA: Diagnosis not present

## 2018-03-10 DIAGNOSIS — R7301 Impaired fasting glucose: Secondary | ICD-10-CM

## 2018-03-10 DIAGNOSIS — F419 Anxiety disorder, unspecified: Secondary | ICD-10-CM

## 2018-03-10 DIAGNOSIS — R5383 Other fatigue: Secondary | ICD-10-CM | POA: Diagnosis not present

## 2018-03-10 DIAGNOSIS — E291 Testicular hypofunction: Secondary | ICD-10-CM | POA: Diagnosis not present

## 2018-03-10 MED ORDER — ALPRAZOLAM 0.25 MG PO TABS: 0.2500 mg | ORAL_TABLET | Freq: Every evening | ORAL | 1 refills | Status: DC | PRN

## 2018-03-10 MED ORDER — DISULFIRAM 250 MG PO TABS: 250.0000 mg | ORAL_TABLET | Freq: Every day | ORAL | 0 refills | Status: DC

## 2018-03-10 NOTE — Progress Notes (Signed)
Subjective:      Patient ID: Benjamin Hahn, male   DOB: 06-Aug-1933, 84 y.o.   MRN: [id number]  Chief complaint: Follow-up of various problems  HPI      FATIGUE: He says that he gets tired easily and also has difficulty doing much walking.  However has not done any formal exercise for 6 months and also recently had a respiratory illness treated last month with prednisone 50 mg he does not get excessively short of breath now but just gets exhausted when he is more active.  No recent anemia  Lab Results  Component Value Date   HCT 42.7 01/23/2018     ANXIETY: He says that he has had more anxiety and stress and is starting to take 4-5 shots of liquor during the day to help with this and he wants to try and quit.  He thinks this is also making him feel bad  DEPRESSION/anxiety: Treated by psychiatrist. Previously controlled with Wellbutrin ER,  taking 150 mg dosage; also taking imipramine 10 mg at night to help sleep.   Has been on imipramine for several years Has been also advised to start back on Effexor   He thinks he has difficulty sleeping and did better when he was taking Xanax which was started by psychiatrist, asking to renew this Also takes Atarax but this does not help his sleep as much   HYPOTHYROIDISM: This is long-standing and he is usually on a stable dose since 2014 Currently taking 88 g which has been continued for some time His TSH was higher in 2018 and this was because of his taking the levothyroxine after eating in the morning instead of before  TSH is normal as of 3/19   Lab Results  Component Value Date   TSH 2.77 11/01/2017   TSH 5.73 (H) 03/03/2017   TSH 2.91 11/04/2016   FREET4 0.89 11/01/2017   FREET4 0.91 03/03/2017   FREET4 0.96 11/04/2016    HYPOGONADISM: This has been managed by her urologist for the last few years However he had been on testosterone injection for some time and now his cardiologist does not want him to take these because  of cardiovascular risk He has had testosterone levels that have been undetectable without treatment and he has significant fatigue and lethargy without treatment At some point he was taking custom-made cream from the urologist when he was not able to get his Testopel while on Plavix However Testopel is apparently too expensive for him Not sure what his last testosterone level was, no records available for the last 2 years from urologist  He was getting injections every 2 weeks but this was stopped by the cardiologist temporarily because of potential side effects on cardiovascular risk  He was unable to get adequate levels on AndroGel and this was too expensive He is now getting testosterone INJECTIONS, 200 mg every 2 weeks Not clear if he is feeling better as he has had other issues: However his testosterone level about 5 days after his injection was over 600  Lab Results  Component Value Date   TESTOSTERONE 678.10 03/08/2018   TESTOSTERONE 54.30 (L) 01/05/2018   TESTOSTERONE 361.35 03/03/2017   TESTOSTERONE 197.11 (L) 07/02/2016   TESTOSTERONE 433.39 03/16/2014   TESTOSTERONE 0.00 Repeated and verified X2. (L) 10/31/2013       PREDIABETES: The following is a copy of the previous note:  His fasting glucose has been normal more recently on the on this lab work he had oatmeal before  his labs and glucose 110 A1c is in the prediabetic range although as low as 5.7   A1c is 5.9, previously 6.1 He has a grandmother with diabetes  He has been able to keep his weight about the same Only recently has started going back to the [facility] for exercise and tries to walk otherwise also Usually watching portions  Wt Readings from Last 3 Encounters:  03/10/18 184 lb 9.6 oz (83.7 kg)  02/08/18 183 lb (83 kg)  01/26/18 181 lb 3.2 oz (82.2 kg)   Lab Results  Component Value Date   HGBA1C 5.9 11/01/2017   HGBA1C 5.7 06/30/2017   HGBA1C 6.1 06/01/2013   Lab Results  Component Value Date    LDLCALC 42 11/01/2017   CREATININE 1.38 (H) 01/23/2018      LIPIDS: See review of systems  Allergies as of 03/10/2018      Reactions   Lorazepam       Medication List        Accurate as of 03/10/18  8:56 AM. Always use your most recent med list.          acetaminophen 500 MG tablet Commonly known as:  TYLENOL Take 500 mg by mouth daily as needed (pain).   aspirin 81 MG chewable tablet Chew 1 tablet (81 mg total) by mouth daily.   atorvastatin 40 MG tablet Commonly known as:  LIPITOR Take 1 tablet (40 mg total) by mouth daily at 6 PM.   buPROPion 150 MG 24 hr tablet Commonly known as:  WELLBUTRIN XL Take 1 tablet (150 mg total) by mouth every morning.   clobetasol cream 0.05 % Commonly known as:  TEMOVATE APPLY AS DIRECTED.   hydrOXYzine 10 MG tablet Commonly known as:  ATARAX/VISTARIL TAKE 1 TABLET ONCE DAILY FOR ITCHING   imipramine 10 MG tablet Commonly known as:  TOFRANIL 2 q hs   Ipratropium-Albuterol 20-100 MCG/ACT Aers respimat Commonly known as:  COMBIVENT RESPIMAT Inhale 1 puff into the lungs every 6 (six) hours as needed for wheezing.   levothyroxine 88 MCG tablet Commonly known as:  SYNTHROID, LEVOTHROID TAKE 1 TABLET BY MOUTH DAILY.   metoprolol succinate 25 MG 24 hr tablet Commonly known as:  TOPROL-XL TAKE (1/2) TABLET DAILY.   mupirocin ointment 2 % Commonly known as:  BACTROBAN Apply 1 application topically 2 (two) times daily.   nitroGLYCERIN 0.4 MG SL tablet Commonly known as:  NITROSTAT Place 1 tablet (0.4 mg total) under the tongue every 5 (five) minutes as needed for chest pain (CP or SOB).   ramipril 1.25 MG capsule Commonly known as:  ALTACE Take 1 capsule (1.25 mg total) by mouth daily.   SYRINGE 3CC/25GX1-1/2" 25G X 1-1/2" 3 ML Misc 1 each by Does not apply route once a week. Use one syringe each week to inject testosterone   tamsulosin 0.4 MG Caps capsule Commonly known as:  FLOMAX TAKE (1) CAPSULE DAILY.     testosterone cypionate 200 MG/ML injection Commonly known as:  DEPOTESTOSTERONE CYPIONATE Inject 1.4mLs (200mg  total) into the muscle once every two weeks.   venlafaxine XR 37.5 MG 24 hr capsule Commonly known as:  EFFEXOR-XR Take 1 capsule (37.5 mg total) by mouth daily.       Review of Systems    VITAMIN D deficiency: Has been recommended supplementation OTC  Hypercholesterolemia: This has been long-standing Since his MI he has been on 40 mg Lipitor from his cardiologist, previously on 10 mg LDL is below 70 as of 3/19  Lab Results  Component Value Date   CHOL 108 11/01/2017   HDL 55.90 11/01/2017   LDLCALC 42 11/01/2017   TRIG 48.0 11/01/2017   CHOLHDL 2 11/01/2017      BPH: He  takes 0.8 mg of Flomax from his urologist with good control of nocturia and frequency, he was told that he has inadequate emptying of the bladder also   HYPERTENSION: Not present, only on minimal doses of ramipril from his cardiologist for cardiac reasons        Objective:   Physical Exam BP 118/60 (BP Location: Left Arm, Patient Position: Sitting, Cuff Size: Normal)   Pulse 86   Ht 5' 7.5" (1.715 m)   Wt 184 lb 9.6 oz (83.7 kg)   SpO2 97%   BMI 28.49 kg/m      heart sounds normal  Lungs clear  No edema   Assessment:      Hypogonadism: He is getting regular injections which have been used in place of transdermal preparation because of inadequate absorption However surprisingly his testosterone level is over 600 about 5 days after his 200 mg dose   Easy fatigability, low exercise tolerance: Not clear of etiology as he should be feeling better with testosterone supplementation and has no history of anemia or CHF    Depression, anxiety and chronic insomnia: He is still having some depression but has not slept well and he wants to take Xanax again that he had taken previously without overusing it  Alcohol intake: He is drinking regularly during the day for anxiety and  depression and is asking for medication to help him quit  Mild shortness of breath on exertion: His respiratory infection has resolved and etiology unclear.  He needs to discuss with cardiologist but in the meantime needs to gradually increase his walking     Plan:      Trial of Antabuse for alcohol intake  Xanax 0.25 mg as needed especially at night  Continue 37.5 mg venlafaxine   Follow-up with cardiologist regarding mild shortness of breath on exertion and fatigability  He does need to start regular walking for exercise anyway  Vitamin D3, 2000 units daily  His TESTOSTERONE will be reduced to 150 mg every 2 weeks   Total visit time for evaluation and management of multiple problems and counseling =25 minutes  Elayne Snare

## 2018-03-17 ENCOUNTER — Ambulatory Visit (INDEPENDENT_AMBULATORY_CARE_PROVIDER_SITE_OTHER): Payer: Medicare Other

## 2018-03-17 DIAGNOSIS — E291 Testicular hypofunction: Secondary | ICD-10-CM

## 2018-03-17 NOTE — Progress Notes (Addendum)
Per orders of Dr Dwyane Dee injection of 150 testosterone given today by Lolita Rieger RMA . Patient tolerated injection well.

## 2018-03-21 ENCOUNTER — Other Ambulatory Visit: Payer: Self-pay | Admitting: Endocrinology

## 2018-03-31 ENCOUNTER — Ambulatory Visit (INDEPENDENT_AMBULATORY_CARE_PROVIDER_SITE_OTHER): Payer: Medicare Other

## 2018-03-31 DIAGNOSIS — E291 Testicular hypofunction: Secondary | ICD-10-CM

## 2018-03-31 MED ORDER — TESTOSTERONE CYPIONATE 200 MG/ML IM SOLN
200.0000 mg | Freq: Once | INTRAMUSCULAR | Status: AC
  Administered 2018-03-31: 200 mg via INTRAMUSCULAR

## 2018-03-31 NOTE — Progress Notes (Signed)
Per orders of Dr.Kumar injection of Testosterone Cypionate 200mg  given today by Macyn Remmert/CMA . Patient tolerated injection well.

## 2018-04-04 ENCOUNTER — Telehealth: Payer: Self-pay

## 2018-04-04 NOTE — Telephone Encounter (Signed)
Do not feel that 0.5 mg dosage is safe for his age, he should also discuss with psychiatrist

## 2018-04-04 NOTE — Telephone Encounter (Signed)
Pt would like his Xanax rx changed from 0.25mg  to 0.5mg  please advise

## 2018-04-04 NOTE — Telephone Encounter (Signed)
Pt is aware.  

## 2018-04-14 ENCOUNTER — Other Ambulatory Visit: Payer: Self-pay | Admitting: Endocrinology

## 2018-04-14 ENCOUNTER — Ambulatory Visit (INDEPENDENT_AMBULATORY_CARE_PROVIDER_SITE_OTHER): Payer: Medicare Other

## 2018-04-14 DIAGNOSIS — E291 Testicular hypofunction: Secondary | ICD-10-CM | POA: Diagnosis not present

## 2018-04-14 MED ORDER — TESTOSTERONE CYPIONATE 200 MG/ML IM SOLN: INTRAMUSCULAR | 3 refills | Status: DC

## 2018-04-14 MED ORDER — TESTOSTERONE CYPIONATE 200 MG/ML IM SOLN
200.0000 mg | Freq: Once | INTRAMUSCULAR | Status: AC
  Administered 2018-04-14: 200 mg via INTRAMUSCULAR

## 2018-04-14 NOTE — Progress Notes (Addendum)
Per orders of Dr. Dwyane Dee injection of 200 mg testosterone given today by Lolita Rieger RMA . Patient tolerated injection well. Pt supplied medication

## 2018-04-15 DIAGNOSIS — I25118 Atherosclerotic heart disease of native coronary artery with other forms of angina pectoris: Secondary | ICD-10-CM | POA: Diagnosis not present

## 2018-04-15 DIAGNOSIS — E785 Hyperlipidemia, unspecified: Secondary | ICD-10-CM | POA: Diagnosis not present

## 2018-04-15 DIAGNOSIS — J45909 Unspecified asthma, uncomplicated: Secondary | ICD-10-CM | POA: Diagnosis not present

## 2018-04-15 DIAGNOSIS — M1990 Unspecified osteoarthritis, unspecified site: Secondary | ICD-10-CM | POA: Diagnosis not present

## 2018-04-15 DIAGNOSIS — I10 Essential (primary) hypertension: Secondary | ICD-10-CM | POA: Diagnosis not present

## 2018-04-15 DIAGNOSIS — N189 Chronic kidney disease, unspecified: Secondary | ICD-10-CM | POA: Diagnosis not present

## 2018-04-16 ENCOUNTER — Other Ambulatory Visit: Payer: Self-pay | Admitting: Endocrinology

## 2018-04-27 ENCOUNTER — Other Ambulatory Visit: Payer: Self-pay | Admitting: Endocrinology

## 2018-04-27 NOTE — Telephone Encounter (Signed)
Please refill.

## 2018-04-28 ENCOUNTER — Ambulatory Visit (INDEPENDENT_AMBULATORY_CARE_PROVIDER_SITE_OTHER): Payer: Medicare Other

## 2018-04-28 ENCOUNTER — Other Ambulatory Visit: Payer: Self-pay | Admitting: Endocrinology

## 2018-04-28 ENCOUNTER — Ambulatory Visit: Payer: Self-pay | Admitting: Endocrinology

## 2018-04-28 DIAGNOSIS — E291 Testicular hypofunction: Secondary | ICD-10-CM | POA: Diagnosis not present

## 2018-04-28 MED ORDER — TESTOSTERONE CYPIONATE 200 MG/ML IM SOLN
200.0000 mg | INTRAMUSCULAR | Status: DC
Start: 2018-04-28 — End: 2019-06-01
  Administered 2018-04-28: 200 mg via INTRAMUSCULAR

## 2018-04-28 NOTE — Progress Notes (Addendum)
Per orders of Dr. Dwyane Dee injection of 200 mg testosterone given today by Lolita Rieger RMA. Patient tolerated injection well.Pt supplied medication.

## 2018-05-11 ENCOUNTER — Ambulatory Visit: Payer: Self-pay | Admitting: Endocrinology

## 2018-05-12 ENCOUNTER — Ambulatory Visit: Payer: Self-pay

## 2018-05-18 ENCOUNTER — Ambulatory Visit (INDEPENDENT_AMBULATORY_CARE_PROVIDER_SITE_OTHER): Payer: Medicare Other | Admitting: Psychiatry

## 2018-05-18 ENCOUNTER — Encounter (HOSPITAL_COMMUNITY): Payer: Self-pay | Admitting: Psychiatry

## 2018-05-18 VITALS — BP 118/67 | HR 73 | Ht 67.5 in | Wt 185.0 lb

## 2018-05-18 DIAGNOSIS — F324 Major depressive disorder, single episode, in partial remission: Secondary | ICD-10-CM | POA: Diagnosis not present

## 2018-05-18 DIAGNOSIS — Z87891 Personal history of nicotine dependence: Secondary | ICD-10-CM

## 2018-05-18 MED ORDER — VENLAFAXINE HCL ER 37.5 MG PO CP24: 37.5000 mg | ORAL_CAPSULE | Freq: Every day | ORAL | 5 refills | Status: DC

## 2018-05-18 MED ORDER — ALPRAZOLAM 0.25 MG PO TABS: 0.2500 mg | ORAL_TABLET | Freq: Every evening | ORAL | 4 refills | Status: DC | PRN

## 2018-05-18 MED ORDER — TEMAZEPAM 15 MG PO CAPS: 15.0000 mg | ORAL_CAPSULE | Freq: Every evening | ORAL | 4 refills | Status: DC | PRN

## 2018-05-18 NOTE — Progress Notes (Signed)
Patient ID: Benjamin Hahn, male   DOB: 04-26-1933, 84 y.o.   MRN: [id number] [facility] MD/PA/NP OP Progress Note  05/18/2018 4:26 PM Benjamin Hahn  MRN:  [id number]  Chief Complaint: Clinical depression Subjective: Doing great  Today the patient is doing very well.  He denies daily depression.  His biggest problem seems to be alcohol use.  His primary care doctor put him on medicine to prevent him from drinking but actually is a very powerful medicine.  He did stop drinking for a few weeks.  The patient actually drinks typically for 5 drinks but is cut down to only 1 or 2 which is great.  In the past the patient is having GI bleed.  He denies any problems with blood in the stool at this time.  The patient is sleeping only fairly well.  Been some confusion about his Xanax.  To clarify he should be 1.2 5 at night.  I doubt this helps very much.  We will give him a sleeping aid.  The patient still enjoys life.  His health overall is good.  Financially he is not so good.  He claims he still has to work to pay for his house.  His wife is doing well.  Patient continues to work at the age of 44.  He feels good about what he does.  It improves his identity.  The patient denies any chest pain or shortness of breath.  He denies any neurological symptoms time.  Today patient is doing very well.  He continues to function very well.  Past Medical History:  Diagnosis Date  . Anxiety   . Arthritis    might be in back, no problems  . BPH (benign prostatic hyperplasia)   . Depression 1990s  . GERD (gastroesophageal reflux disease)    occasional  . Hypercholesteremia    controled  . Hypothyroid   . Shingles May 2014   "Right face, still has some"  . Temporal arteritis ([place])   . Transient blindness of both eyes     Past Surgical History:  Procedure Laterality Date  . ARTERY BIOPSY Right 06/20/2013   Procedure: BIOPSY TEMPORAL ARTERY RIGHT;  Surgeon: Earnstine Regal, MD;  Location: [facility];  Service:  General;  Laterality: Right;  . CARDIAC CATHETERIZATION N/A 09/02/2015   Procedure: Left Heart Cath and Coronary Angiography;  Surgeon: Charolette Forward, MD;  Location: [place] [facility];  Service: Cardiovascular;  Laterality: N/A;  . CARDIAC CATHETERIZATION N/A 09/02/2015   Procedure: Coronary Stent Intervention;  Surgeon: Charolette Forward, MD;  Location: [place] [facility];  Service: Cardiovascular;  Laterality: N/A;  1.  mid RCA      (3.0/28mm Xience) 2.  Mid LAD      (3.0/23mm Xience)  . NO PAST SURGERIES    . None      Family Psychiatric History:   Family History:  Family History  Problem Relation Age of Onset  . Stroke Father        Died, 80s  . Stroke Sister        Living, 26  . Heart disease Mother        Died, 7  . Healthy Daughter   . Diabetes Maternal Grandmother   . Hypertension Neg Hx     Social History:  Social History   Socioeconomic History  . Marital status: Married    Spouse name: Not on file  . Number of children: Not on file  . Years of education:  Not on file  . Highest education level: Not on file  Occupational History  . Not on file  Social Needs  . Financial resource strain: Not on file  . Food insecurity:    Worry: Not on file    Inability: Not on file  . Transportation needs:    Medical: Not on file    Non-medical: Not on file  Tobacco Use  . Smoking status: Former Smoker    Types: Cigarettes    Last attempt to quit: 08/24/1966    Years since quitting: 51.7  . Smokeless tobacco: Never Used  Substance and Sexual Activity  . Alcohol use: Yes    Alcohol/week: 2.0 standard drinks    Types: 2 Shots of liquor per week    Comment: socially  . Drug use: No  . Sexual activity: Not on file  Lifestyle  . Physical activity:    Days per week: Not on file    Minutes per session: Not on file  . Stress: Not on file  Relationships  . Social connections:    Talks on phone: Not on file    Gets together: Not on file    Attends religious service: Not on  file    Active member of club or organization: Not on file    Attends meetings of clubs or organizations: Not on file    Relationship status: Not on file  Other Topics Concern  . Not on file  Social History Narrative   Currently works as a Midwife.  Lives with wife and they have two healthy daughters.    Allergies:  Allergies  Allergen Reactions  . Lorazepam     Metabolic Disorder Labs: Lab Results  Component Value Date   HGBA1C 5.9 11/01/2017   No results found for: PROLACTIN Lab Results  Component Value Date   CHOL 108 11/01/2017   TRIG 48.0 11/01/2017   HDL 55.90 11/01/2017   CHOLHDL 2 11/01/2017   VLDL 9.6 11/01/2017   LDLCALC 42 11/01/2017   LDLCALC 57 03/03/2017     Current Medications: Current Outpatient Medications  Medication Sig Dispense Refill  . acetaminophen (TYLENOL) 500 MG tablet Take 500 mg by mouth daily as needed (pain).    [name] ALPRAZolam (XANAX) 0.25 MG tablet Take 1 tablet (0.25 mg total) by mouth at bedtime as needed for anxiety. 30 tablet 4  . aspirin 81 MG chewable tablet Chew 1 tablet (81 mg total) by mouth daily. 30 tablet 3  . atorvastatin (LIPITOR) 40 MG tablet Take 1 tablet (40 mg total) by mouth daily at 6 PM. 30 tablet 3  . buPROPion (WELLBUTRIN XL) 150 MG 24 hr tablet Take 1 tablet (150 mg total) by mouth every morning. 30 tablet 6  . clobetasol cream (TEMOVATE) 0.05 % APPLY AS DIRECTED. 30 g 0  . disulfiram (ANTABUSE) 250 MG tablet Take 1 tablet (250 mg total) by mouth daily. 30 tablet 0  . hydrOXYzine (ATARAX/VISTARIL) 10 MG tablet TAKE 1 TABLET ONCE DAILY FOR ITCHING 30 tablet 0  . hydrOXYzine (ATARAX/VISTARIL) 10 MG tablet TAKE 1 TABLET ONCE DAILY FOR ITCHING 30 tablet 0  . imipramine (TOFRANIL) 10 MG tablet 2 q hs 60 tablet 5  . Ipratropium-Albuterol (COMBIVENT RESPIMAT) 20-100 MCG/ACT AERS respimat Inhale 1 puff into the lungs every 6 (six) hours as needed for wheezing. 4 g 5  . levothyroxine (SYNTHROID, LEVOTHROID) 88 MCG tablet  TAKE 1 TABLET BY MOUTH DAILY. 90 tablet 1  . metoprolol succinate (TOPROL-XL) 25 MG 24  hr tablet TAKE (1/2) TABLET DAILY. 30 tablet 0  . mupirocin ointment (BACTROBAN) 2 % Apply 1 application topically 2 (two) times daily. (Patient taking differently: Apply 1 application topically 2 (two) times daily. ) 22 g 0  . nitroGLYCERIN (NITROSTAT) 0.4 MG SL tablet Place 1 tablet (0.4 mg total) under the tongue every 5 (five) minutes as needed for chest pain (CP or SOB). 25 tablet 12  . ramipril (ALTACE) 1.25 MG capsule Take 1 capsule (1.25 mg total) by mouth daily. 30 capsule 3  . Syringe/Needle, Disp, (SYRINGE 3CC/25GX1-1/2") 25G X 1-1/2" 3 ML MISC 1 each by Does not apply route once a week. Use one syringe each week to inject testosterone 100 each 0  . tamsulosin (FLOMAX) 0.4 MG CAPS capsule TAKE (1) CAPSULE DAILY. (Patient taking differently: TAKE (2) CAPSULES  DAILY AFTER SUPPER) 30 capsule 3  . testosterone cypionate (DEPOTESTOSTERONE CYPIONATE) 200 MG/ML injection Inject 0.75 MLs (150 mg total) into the muscle once every two weeks. 10 mL 3  . venlafaxine XR (EFFEXOR-XR) 37.5 MG 24 hr capsule Take 1 capsule (37.5 mg total) by mouth daily. 30 capsule 5  . temazepam (RESTORIL) 15 MG capsule Take 1 capsule (15 mg total) by mouth at bedtime as needed for sleep. 30 capsule 4   Current Facility-Administered Medications  Medication Dose Route Frequency Provider Last Rate Last Dose  . testosterone cypionate (DEPOTESTOSTERONE CYPIONATE) injection 200 mg  200 mg Intramuscular Q14 Days Elayne Snare, MD   200 mg at 04/28/18 1529    Neurologic: Headache: No Seizure: No Paresthesias: No  Musculoskeletal: Strength & Muscle Tone: within normal limits Gait & Station: normal Patient leans: NA  Psychiatric Specialty Exam: ROS  Blood pressure 118/67, pulse 73, height 5' 7.5" (1.715 m), weight 185 lb (83.9 kg), SpO2 94 %.Body mass index is 28.55 kg/m.  General Appearance: Fairly Groomed  Eye Contact:  Good   Speech:  Clear and Coherent  Volume:  Normal  Mood:  Euthymic  Affect:  Appropriate  Thought Process:  Coherent  Orientation:  Full (Time, Place, and Person)  Thought Content:  WDL  Suicidal Thoughts:  No  Homicidal Thoughts:  No  Memory:  NA  Judgement:  Good  Insight:  Good  Psychomotor Activity:  Normal  Concentration:  Good  Recall:  Good  Fund of Knowledge: Good  Language: Good  Akathisia:  No  Handed:  Right  AIMS (if indicated):    Assets:  Desire for Improvement  ADL's:  Intact  Cognition: WNL  Sleep:       Treatment Plan at this time the patient is doing well.  His #1 problem seems to be clinical depression.  Continue taking Effexor 75 mg and Wellbutrin for this condition.  His second problem is alcohol abuse.  Today's he is made a commitment to reduce his alcohol to 1 or 2 drinks.  His third problem is that of insomnia.  At this time we will go ahead and start him on Restoril 15 mg at night for sleep.  Patient continue taking imipramine and Xanax both at night.  The patient presently is getting good medical care.  He denies chest pain shortness of breath or any neurological symptoms.  He will return to see me in 5 months.  Jerral Ralph, MD 05/18/2018, 4:26 PM [facility] MD Progress Note  05/18/2018 4:26 PM Benjamin Hahn  MRN:  [id number] Subjective:  Feels well Principal Problem: Major depression, chronic, mild/residual Diagnosis:  Major depression, recurrent residual Today  the patient is doing fairly well. He's been having some medical problems. He recently went for Thanksgiving to a length but unfortunately had a urinary tract infection. He also since then has been coughing and recently started on antibiotic. Emotionally he is fairly stable. He denies daily depression. He stays very active at work. He's been unable to exercise. The patient is sleeping well and has an increase in his appetite. His energy is good. Is no problems thinking or concentrating. He is  recently having a productive cough but is no shortness of breath. He denies any chest pain. Is a stable relationship with his wife. His kidneys are good. He has 4 grandchildren. Everybody in his home is good. He only issue is that a number mornings he awakens and he feels oversedated. It occurs point of his wife feels uncomfortable with him driving will taken to work. Today reviewed his medications and is evident that he likely needs reduction. He also notes that his memory has change in his and is good. 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 Patient Active Problem List   Diagnosis Date Noted  . Cough [R05] 02/08/2018  . Mild intermittent asthma with acute exacerbation [J45.21] 02/08/2018  . RTI (respiratory tract infection) [J98.8] 02/08/2018  . Temporal arteritis ([place]) [M31.6] 06/16/2013  . Amaurosis fugax [G45.3] 06/01/2013  . Unspecified hypothyroidism [E03.9] 03/14/2013  . Other and unspecified hyperlipidemia [E78.5] 03/14/2013  . BPH (benign prostatic hypertrophy) [N40.0] 03/14/2013  . Hypogonadism, male [E29.1] 03/14/2013  . Erectile dysfunction [N52.9] 03/14/2013   Total Time spent with patient: 30 minutes  Past Psychiatric History:   Past Medical History:  Past Medical History:  Diagnosis Date  . Anxiety   . Arthritis    might be in back, no problems  . BPH (benign prostatic hyperplasia)   . Depression 1990s  . GERD (gastroesophageal reflux disease)    occasional  . Hypercholesteremia    controled  . Hypothyroid   . Shingles May 2014   "Right face, still has some"  . Temporal arteritis ([place])   . Transient blindness of both eyes     Past Surgical History:  Procedure Laterality Date  . ARTERY BIOPSY Right 06/20/2013   Procedure: BIOPSY TEMPORAL ARTERY RIGHT;  Surgeon: Earnstine Regal, MD;  [facility]: [facility];  Service: General;  Laterality: Right;  . CARDIAC CATHETERIZATION N/A 09/02/2015   Procedure: Left Heart Cath and  Coronary Angiography;  Surgeon: Charolette Forward, MD;  Location: [place] [facility];  Service: Cardiovascular;  Laterality: N/A;  . CARDIAC CATHETERIZATION N/A 09/02/2015   Procedure: Coronary Stent Intervention;  Surgeon: Charolette Forward, MD;  Location: [place] [facility];  Service: Cardiovascular;  Laterality: N/A;  1.  mid RCA      (3.0/28mm Xience) 2.  Mid LAD      (3.0/23mm Xience)  . NO PAST SURGERIES    . None     Family History:  Family History  Problem Relation Age of Onset  . Stroke Father        Died, 80s  . Stroke Sister        Living, 71  . Heart disease Mother        Died, 34  . Healthy Daughter   . Diabetes Maternal Grandmother   . Hypertension Neg Hx    Family Psychiatric  History:  Social History:  Social History   Substance and Sexual Activity  Alcohol Use Yes  .  Alcohol/week: 2.0 standard drinks  . Types: 2 Shots of liquor per week   Comment: socially     Social History   Substance and Sexual Activity  Drug Use No    Social History   Socioeconomic History  . Marital status: Married    Spouse name: Not on file  . Number of children: Not on file  . Years of education: Not on file  . Highest education level: Not on file  Occupational History  . Not on file  Social Needs  . Financial resource strain: Not on file  . Food insecurity:    Worry: Not on file    Inability: Not on file  . Transportation needs:    Medical: Not on file    Non-medical: Not on file  Tobacco Use  . Smoking status: Former Smoker    Types: Cigarettes    Last attempt to quit: 08/24/1966    Years since quitting: 51.7  . Smokeless tobacco: Never Used  Substance and Sexual Activity  . Alcohol use: Yes    Alcohol/week: 2.0 standard drinks    Types: 2 Shots of liquor per week    Comment: socially  . Drug use: No  . Sexual activity: Not on file  Lifestyle  . Physical activity:    Days per week: Not on file    Minutes per session: Not on file  . Stress: Not on file   Relationships  . Social connections:    Talks on phone: Not on file    Gets together: Not on file    Attends religious service: Not on file    Active member of club or organization: Not on file    Attends meetings of clubs or organizations: Not on file    Relationship status: Not on file  Other Topics Concern  . Not on file  Social History Narrative   Currently works as a Midwife.  Lives with wife and they have two healthy daughters.   Additional Social History:                         Sleep: Good  Appetite:  Good  Current Medications: Current Outpatient Medications  Medication Sig Dispense Refill  . acetaminophen (TYLENOL) 500 MG tablet Take 500 mg by mouth daily as needed (pain).    [name] ALPRAZolam (XANAX) 0.25 MG tablet Take 1 tablet (0.25 mg total) by mouth at bedtime as needed for anxiety. 30 tablet 4  . aspirin 81 MG chewable tablet Chew 1 tablet (81 mg total) by mouth daily. 30 tablet 3  . atorvastatin (LIPITOR) 40 MG tablet Take 1 tablet (40 mg total) by mouth daily at 6 PM. 30 tablet 3  . buPROPion (WELLBUTRIN XL) 150 MG 24 hr tablet Take 1 tablet (150 mg total) by mouth every morning. 30 tablet 6  . clobetasol cream (TEMOVATE) 0.05 % APPLY AS DIRECTED. 30 g 0  . disulfiram (ANTABUSE) 250 MG tablet Take 1 tablet (250 mg total) by mouth daily. 30 tablet 0  . hydrOXYzine (ATARAX/VISTARIL) 10 MG tablet TAKE 1 TABLET ONCE DAILY FOR ITCHING 30 tablet 0  . hydrOXYzine (ATARAX/VISTARIL) 10 MG tablet TAKE 1 TABLET ONCE DAILY FOR ITCHING 30 tablet 0  . imipramine (TOFRANIL) 10 MG tablet 2 q hs 60 tablet 5  . Ipratropium-Albuterol (COMBIVENT RESPIMAT) 20-100 MCG/ACT AERS respimat Inhale 1 puff into the lungs every 6 (six) hours as needed for wheezing. 4 g 5  . levothyroxine (SYNTHROID,  LEVOTHROID) 88 MCG tablet TAKE 1 TABLET BY MOUTH DAILY. 90 tablet 1  . metoprolol succinate (TOPROL-XL) 25 MG 24 hr tablet TAKE (1/2) TABLET DAILY. 30 tablet 0  . mupirocin ointment  (BACTROBAN) 2 % Apply 1 application topically 2 (two) times daily. (Patient taking differently: Apply 1 application topically 2 (two) times daily. ) 22 g 0  . nitroGLYCERIN (NITROSTAT) 0.4 MG SL tablet Place 1 tablet (0.4 mg total) under the tongue every 5 (five) minutes as needed for chest pain (CP or SOB). 25 tablet 12  . ramipril (ALTACE) 1.25 MG capsule Take 1 capsule (1.25 mg total) by mouth daily. 30 capsule 3  . Syringe/Needle, Disp, (SYRINGE 3CC/25GX1-1/2") 25G X 1-1/2" 3 ML MISC 1 each by Does not apply route once a week. Use one syringe each week to inject testosterone 100 each 0  . tamsulosin (FLOMAX) 0.4 MG CAPS capsule TAKE (1) CAPSULE DAILY. (Patient taking differently: TAKE (2) CAPSULES  DAILY AFTER SUPPER) 30 capsule 3  . testosterone cypionate (DEPOTESTOSTERONE CYPIONATE) 200 MG/ML injection Inject 0.75 MLs (150 mg total) into the muscle once every two weeks. 10 mL 3  . venlafaxine XR (EFFEXOR-XR) 37.5 MG 24 hr capsule Take 1 capsule (37.5 mg total) by mouth daily. 30 capsule 5  . temazepam (RESTORIL) 15 MG capsule Take 1 capsule (15 mg total) by mouth at bedtime as needed for sleep. 30 capsule 4   Current Facility-Administered Medications  Medication Dose Route Frequency Provider Last Rate Last Dose  . testosterone cypionate (DEPOTESTOSTERONE CYPIONATE) injection 200 mg  200 mg Intramuscular Q14 Days Elayne Snare, MD   200 mg at 04/28/18 1529    Lab Results:  No results found for this or any previous visit (from the past 48 hour(s)).  Physical Findings: AIMS:  , ,  ,  ,    CIWA:    COWS:     Musculoskeletal: Strength & Muscle Tone: within normal limits Gait & Station: normal Patient leans: Right  Psychiatric Specialty Exam: ROS  Blood pressure 118/67, pulse 73, height 5' 7.5" (1.715 m), weight 185 lb (83.9 kg), SpO2 94 %.Body mass index is 28.55 kg/m.  General Appearance: Negative  Eye Contact::  Good  Speech:  Clear and Coherent  Volume:  Normal  Mood:  NA   Affect:  Appropriate  Thought Process:  Coherent  Orientation:  Full (Time, Place, and Person)  Thought Content:  WDL  Suicidal Thoughts:  No  Homicidal Thoughts:  No  Memory:  NA  Judgement:  Good  Insight:  Good  Psychomotor Activity:  Normal  Concentration:  Good  Recall:  Good  Fund of Knowledge:Good  Language: Good  Akathisia:  No  Handed:  Right  AIMS (if indicated):     Assets:  Desire for Improvement  ADL's:  Intact  Cognition: WNL  Sleep:      Treatment Plan Summary:At this time the patient will continue taking imipramine 10 mg at night and Xanax 0.5 mg. One of his problems with sleep or these medications she does well. Is not on it major problem is that of clinical depression. Again he has problems with compliance. She's taking [place] 150 mg XL all the complaints about extensive. He does state. For reasons that are not clear he stopped his Effexor. Today he is agreed to restart his Effexor 75 mg XL each morning. Taking Effexor with [place] morning and he takes Xanax and imipramine night. He'll return to see me in 3 months for an opportunity to  have some supportive psychotherapy. Presently the patient is not in therapy. I do not think this patient is suicidal at this time. Physically he is very stable. [facility] MD Progress Note  05/18/2018 4:26 PM Benjamin Hahn  MRN:  [id number] Subjective:  Feels well Principal Problem: Major depression, chronic, mild/residual Diagnosis:  Major depression, recurrent residual Today the patient is doing fairly well. He's been having some medical problems. He recently went for Thanksgiving to a length but unfortunately had a urinary tract infection. He also since then has been coughing and recently started on antibiotic. Emotionally he is fairly stable. He denies daily depression. He stays very active at work. He's been unable to exercise. The patient is sleeping well and has an increase in his appetite. His energy is good. Is no problems  thinking or concentrating. He is recently having a productive cough but is no shortness of breath. He denies any chest pain. Is a stable relationship with his wife. His kidneys are good. He has 4 grandchildren. Everybody in his home is good. He only issue is that a number mornings he awakens and he feels oversedated. It occurs point of his wife feels uncomfortable with him driving will taken to work. Today reviewed his medications and is evident that he likely needs reduction. He also notes that his memory has change in his and is good. 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 Patient Active Problem List   Diagnosis Date Noted  . Cough [R05] 02/08/2018  . Mild intermittent asthma with acute exacerbation [J45.21] 02/08/2018  . RTI (respiratory tract infection) [J98.8] 02/08/2018  . Temporal arteritis ([place]) [M31.6] 06/16/2013  . Amaurosis fugax [G45.3] 06/01/2013  . Unspecified hypothyroidism [E03.9] 03/14/2013  . Other and unspecified hyperlipidemia [E78.5] 03/14/2013  . BPH (benign prostatic hypertrophy) [N40.0] 03/14/2013  . Hypogonadism, male [E29.1] 03/14/2013  . Erectile dysfunction [N52.9] 03/14/2013   Total Time spent with patient: 30 minutes  Past Psychiatric History:   Past Medical History:  Past Medical History:  Diagnosis Date  . Anxiety   . Arthritis    might be in back, no problems  . BPH (benign prostatic hyperplasia)   . Depression 1990s  . GERD (gastroesophageal reflux disease)    occasional  . Hypercholesteremia    controled  . Hypothyroid   . Shingles May 2014   "Right face, still has some"  . Temporal arteritis ([place])   . Transient blindness of both eyes     Past Surgical History:  Procedure Laterality Date  . ARTERY BIOPSY Right 06/20/2013   Procedure: BIOPSY TEMPORAL ARTERY RIGHT;  Surgeon: Earnstine Regal, MD;  [facility]: [facility];  Service: General;  Laterality: Right;  . CARDIAC CATHETERIZATION N/A 09/02/2015    Procedure: Left Heart Cath and Coronary Angiography;  Surgeon: Charolette Forward, MD;  Location: [place] [facility];  Service: Cardiovascular;  Laterality: N/A;  . CARDIAC CATHETERIZATION N/A 09/02/2015   Procedure: Coronary Stent Intervention;  Surgeon: Charolette Forward, MD;  Location: [place] [facility];  Service: Cardiovascular;  Laterality: N/A;  1.  mid RCA      (3.0/28mm Xience) 2.  Mid LAD      (3.0/23mm Xience)  . NO PAST SURGERIES    . None     Family History:  Family History  Problem Relation Age of Onset  . Stroke Father        Died, 80s  . Stroke Sister  Living, 75  . Heart disease Mother        Died, 44  . Healthy Daughter   . Diabetes Maternal Grandmother   . Hypertension Neg Hx    Family Psychiatric  History:  Social History:  Social History   Substance and Sexual Activity  Alcohol Use Yes  . Alcohol/week: 2.0 standard drinks  . Types: 2 Shots of liquor per week   Comment: socially     Social History   Substance and Sexual Activity  Drug Use No    Social History   Socioeconomic History  . Marital status: Married    Spouse name: Not on file  . Number of children: Not on file  . Years of education: Not on file  . Highest education level: Not on file  Occupational History  . Not on file  Social Needs  . Financial resource strain: Not on file  . Food insecurity:    Worry: Not on file    Inability: Not on file  . Transportation needs:    Medical: Not on file    Non-medical: Not on file  Tobacco Use  . Smoking status: Former Smoker    Types: Cigarettes    Last attempt to quit: 08/24/1966    Years since quitting: 51.7  . Smokeless tobacco: Never Used  Substance and Sexual Activity  . Alcohol use: Yes    Alcohol/week: 2.0 standard drinks    Types: 2 Shots of liquor per week    Comment: socially  . Drug use: No  . Sexual activity: Not on file  Lifestyle  . Physical activity:    Days per week: Not on file    Minutes per session: Not on file  .  Stress: Not on file  Relationships  . Social connections:    Talks on phone: Not on file    Gets together: Not on file    Attends religious service: Not on file    Active member of club or organization: Not on file    Attends meetings of clubs or organizations: Not on file    Relationship status: Not on file  Other Topics Concern  . Not on file  Social History Narrative   Currently works as a Midwife.  Lives with wife and they have two healthy daughters.   Additional Social History:                         Sleep: Good  Appetite:  Good  Current Medications: Current Outpatient Medications  Medication Sig Dispense Refill  . acetaminophen (TYLENOL) 500 MG tablet Take 500 mg by mouth daily as needed (pain).    [name] ALPRAZolam (XANAX) 0.25 MG tablet Take 1 tablet (0.25 mg total) by mouth at bedtime as needed for anxiety. 30 tablet 4  . aspirin 81 MG chewable tablet Chew 1 tablet (81 mg total) by mouth daily. 30 tablet 3  . atorvastatin (LIPITOR) 40 MG tablet Take 1 tablet (40 mg total) by mouth daily at 6 PM. 30 tablet 3  . buPROPion (WELLBUTRIN XL) 150 MG 24 hr tablet Take 1 tablet (150 mg total) by mouth every morning. 30 tablet 6  . clobetasol cream (TEMOVATE) 0.05 % APPLY AS DIRECTED. 30 g 0  . disulfiram (ANTABUSE) 250 MG tablet Take 1 tablet (250 mg total) by mouth daily. 30 tablet 0  . hydrOXYzine (ATARAX/VISTARIL) 10 MG tablet TAKE 1 TABLET ONCE DAILY FOR ITCHING 30 tablet 0  . hydrOXYzine (  ATARAX/VISTARIL) 10 MG tablet TAKE 1 TABLET ONCE DAILY FOR ITCHING 30 tablet 0  . imipramine (TOFRANIL) 10 MG tablet 2 q hs 60 tablet 5  . Ipratropium-Albuterol (COMBIVENT RESPIMAT) 20-100 MCG/ACT AERS respimat Inhale 1 puff into the lungs every 6 (six) hours as needed for wheezing. 4 g 5  . levothyroxine (SYNTHROID, LEVOTHROID) 88 MCG tablet TAKE 1 TABLET BY MOUTH DAILY. 90 tablet 1  . metoprolol succinate (TOPROL-XL) 25 MG 24 hr tablet TAKE (1/2) TABLET DAILY. 30 tablet 0  .  mupirocin ointment (BACTROBAN) 2 % Apply 1 application topically 2 (two) times daily. (Patient taking differently: Apply 1 application topically 2 (two) times daily. ) 22 g 0  . nitroGLYCERIN (NITROSTAT) 0.4 MG SL tablet Place 1 tablet (0.4 mg total) under the tongue every 5 (five) minutes as needed for chest pain (CP or SOB). 25 tablet 12  . ramipril (ALTACE) 1.25 MG capsule Take 1 capsule (1.25 mg total) by mouth daily. 30 capsule 3  . Syringe/Needle, Disp, (SYRINGE 3CC/25GX1-1/2") 25G X 1-1/2" 3 ML MISC 1 each by Does not apply route once a week. Use one syringe each week to inject testosterone 100 each 0  . tamsulosin (FLOMAX) 0.4 MG CAPS capsule TAKE (1) CAPSULE DAILY. (Patient taking differently: TAKE (2) CAPSULES  DAILY AFTER SUPPER) 30 capsule 3  . testosterone cypionate (DEPOTESTOSTERONE CYPIONATE) 200 MG/ML injection Inject 0.75 MLs (150 mg total) into the muscle once every two weeks. 10 mL 3  . venlafaxine XR (EFFEXOR-XR) 37.5 MG 24 hr capsule Take 1 capsule (37.5 mg total) by mouth daily. 30 capsule 5  . temazepam (RESTORIL) 15 MG capsule Take 1 capsule (15 mg total) by mouth at bedtime as needed for sleep. 30 capsule 4   Current Facility-Administered Medications  Medication Dose Route Frequency Provider Last Rate Last Dose  . testosterone cypionate (DEPOTESTOSTERONE CYPIONATE) injection 200 mg  200 mg Intramuscular Q14 Days Elayne Snare, MD   200 mg at 04/28/18 1529    Lab Results:  No results found for this or any previous visit (from the past 48 hour(s)).  Physical Findings: AIMS:  , ,  ,  ,    CIWA:    COWS:     Musculoskeletal: Strength & Muscle Tone: within normal limits Gait & Station: normal Patient leans: Right  Psychiatric Specialty Exam: ROS  Blood pressure 118/67, pulse 73, height 5' 7.5" (1.715 m), weight 185 lb (83.9 kg), SpO2 94 %.Body mass index is 28.55 kg/m.  General Appearance: Negative  Eye Contact::  Good  Speech:  Clear and Coherent  Volume:  Normal   Mood:  NA  Affect:  Appropriate  Thought Process:  Coherent  Orientation:  Full (Time, Place, and Person)  Thought Content:  WDL  Suicidal Thoughts:  No  Homicidal Thoughts:  No  Memory:  NA  Judgement:  Good  Insight:  Good  Psychomotor Activity:  Normal  Concentration:  Good  Recall:  Good  Fund of Knowledge:Good  Language: Good  Akathisia:  No  Handed:  Right  AIMS (if indicated):     Assets:  Desire for Improvement  ADL's:  Intact  Cognition: WNL  Sleep:      Treatment Plan Summary: At this time the patient is agreed to allow me to prescribe all his psychiatric medications for now continue taking Effexor 37.5 mg. We will actually increase his imipramine from a dose of 10 mg July higher dose of 20 mg. Hopefully this will help him fall off to  sleep better. He's been taking it for years and has no adverse side effects. We've asked him to start taking melatonin and he insists that he needs to take 10 mg if he's going to. He notes his wife takes only 5 this is reasonable and I'm not concerned about it. Therefore he'll take no benzodiazepines will continue taking 37.5 mg of Effexor and continue taking Wellbutrin 150 mg XL. He'll increase his imipramine to a dose of 20 mg. He'll reduce his alcohol 1 or 2 drinks and he'll return to see me in 3 months for a 30 minute visit.

## 2018-05-20 ENCOUNTER — Other Ambulatory Visit: Payer: Self-pay | Admitting: Endocrinology

## 2018-05-23 ENCOUNTER — Other Ambulatory Visit: Payer: Self-pay | Admitting: Endocrinology

## 2018-05-27 NOTE — Telephone Encounter (Signed)
error 

## 2018-06-03 DIAGNOSIS — Z23 Encounter for immunization: Secondary | ICD-10-CM | POA: Diagnosis not present

## 2018-06-06 DIAGNOSIS — Z961 Presence of intraocular lens: Secondary | ICD-10-CM | POA: Diagnosis not present

## 2018-06-06 DIAGNOSIS — H2512 Age-related nuclear cataract, left eye: Secondary | ICD-10-CM | POA: Diagnosis not present

## 2018-06-07 ENCOUNTER — Ambulatory Visit: Payer: Self-pay | Admitting: Endocrinology

## 2018-06-07 DIAGNOSIS — H25041 Posterior subcapsular polar age-related cataract, right eye: Secondary | ICD-10-CM | POA: Diagnosis not present

## 2018-06-07 DIAGNOSIS — H2511 Age-related nuclear cataract, right eye: Secondary | ICD-10-CM | POA: Diagnosis not present

## 2018-06-07 DIAGNOSIS — H25011 Cortical age-related cataract, right eye: Secondary | ICD-10-CM | POA: Diagnosis not present

## 2018-06-27 DIAGNOSIS — H2511 Age-related nuclear cataract, right eye: Secondary | ICD-10-CM | POA: Diagnosis not present

## 2018-06-27 DIAGNOSIS — Z961 Presence of intraocular lens: Secondary | ICD-10-CM | POA: Diagnosis not present

## 2018-06-28 ENCOUNTER — Other Ambulatory Visit: Payer: Self-pay | Admitting: Endocrinology

## 2018-07-08 ENCOUNTER — Telehealth: Payer: Self-pay | Admitting: Internal Medicine

## 2018-07-08 ENCOUNTER — Encounter: Payer: Self-pay | Admitting: Family Medicine

## 2018-07-08 ENCOUNTER — Ambulatory Visit: Payer: Self-pay | Admitting: Family Medicine

## 2018-07-08 ENCOUNTER — Ambulatory Visit: Payer: Self-pay

## 2018-07-08 ENCOUNTER — Ambulatory Visit (INDEPENDENT_AMBULATORY_CARE_PROVIDER_SITE_OTHER): Payer: Medicare Other | Admitting: Family Medicine

## 2018-07-08 VITALS — BP 140/84 | HR 70 | Resp 16 | Wt 190.0 lb

## 2018-07-08 DIAGNOSIS — R42 Dizziness and giddiness: Secondary | ICD-10-CM

## 2018-07-08 MED ORDER — MECLIZINE HCL 25 MG PO TABS: 25.0000 mg | ORAL_TABLET | Freq: Two times a day (BID) | ORAL | 0 refills | Status: DC | PRN

## 2018-07-08 NOTE — Patient Instructions (Signed)
Nice to meet you  Please try the exercise first to see if that helps your dizziness  Please try the medicine if you continue to have symptoms  If your symptoms get worse or you develop new numbness then you will need to be seen in the emergency room   How to Perform the Epley Maneuver The Epley maneuver is an exercise that relieves symptoms of vertigo. Vertigo is the feeling that you or your surroundings are moving when they are not. When you feel vertigo, you may feel like the room is spinning and have trouble walking. Dizziness is a little different than vertigo. When you are dizzy, you may feel unsteady or light-headed. You can do this maneuver at home whenever you have symptoms of vertigo. You can do it up to 3 times a day until your symptoms go away. Even though the Epley maneuver may relieve your vertigo for a few weeks, it is possible that your symptoms will return. This maneuver relieves vertigo, but it does not relieve dizziness. What are the risks? If it is done correctly, the Epley maneuver is considered safe. Sometimes it can lead to dizziness or nausea that goes away after a short time. If you develop other symptoms, such as changes in vision, weakness, or numbness, stop doing the maneuver and call your health care provider. How to perform the Epley maneuver 1. Sit on the edge of a bed or table with your back straight and your legs extended or hanging over the edge of the bed or table. 2. Turn your head halfway toward the affected ear or side. 3. Lie backward quickly with your head turned until you are lying flat on your back. You may want to position a pillow under your shoulders. 4. Hold this position for 30 seconds. You may experience an attack of vertigo. This is normal. 5. Turn your head to the opposite direction until your unaffected ear is facing the floor. 6. Hold this position for 30 seconds. You may experience an attack of vertigo. This is normal. Hold this position until the  vertigo stops. 7. Turn your whole body to the same side as your head. Hold for another 30 seconds. 8. Sit back up. You can repeat this exercise up to 3 times a day. Follow these instructions at home:  After doing the Epley maneuver, you can return to your normal activities.  Ask your health care provider if there is anything you should do at home to prevent vertigo. He or she may recommend that you: ? Keep your head raised (elevated) with two or more pillows while you sleep. ? Do not sleep on the side of your affected ear. ? Get up slowly from bed. ? Avoid sudden movements during the day. ? Avoid extreme head movement, like looking up or bending over. Contact a health care provider if:  Your vertigo gets worse.  You have other symptoms, including: ? Nausea. ? Vomiting. ? Headache. Get help right away if:  You have vision changes.  You have a severe or worsening headache or neck pain.  You cannot stop vomiting.  You have new numbness or weakness in any part of your body. Summary  Vertigo is the feeling that you or your surroundings are moving when they are not.  The Epley maneuver is an exercise that relieves symptoms of vertigo.  If the Epley maneuver is done correctly, it is considered safe. You can do it up to 3 times a day. This information is not intended  to replace advice given to you by your health care provider. Make sure you discuss any questions you have with your health care provider. Document Released: 08/15/2013 Document Revised: 06/30/2016 Document Reviewed: 06/30/2016 Elsevier Interactive Patient Education  2017 [employer].

## 2018-07-08 NOTE — Telephone Encounter (Addendum)
Patient called in with c/o "dizziness." He says "it started yesterday and I was able to drive home. I thought it would go away. This morning I got up and was still dizzy. I fell up against the door in the middle of the night, not hitting the floor, because I was dizzy. The room is swimming and I have to hold on to walk. I can walk, just hold on." I asked what makes it worse, he says "lying down, standing, changing positions." I asked about other symptoms, he says "headache and nausea." Patient says he is a patient of Dr. Ronnald Ramp and wants to start seeing him, because the office is closer for him to come to and he likes Dr. Ronnald Ramp. I advised no availability with Dr. Ronnald Ramp today, the patient says "just get me in with someone there, it will be fine for today. Then I want to start coming there in December or even January to see Dr. Ronnald Ramp." Appointment scheduled for today at 1440 with Dr. Raeford Razor, care advice given, patient verbalized understanding. I hung up with the patient and called the office and spoke to [place], [facility]. I advised her of the appointment and asked if he is a patient at [facility], she says no, but hold on for the approval of the appointment. Tammy, FC came on the line and advised me to call another practice to a provider who is accepting new patients, schedule the acute visit today with that provider and schedule a new patient visit for next week. I advised the patient requested to be a patient of Dr. Ronnald Ramp because he saw him in June and really likes him, advised the patient says even if he sees him in December or January, that would be fine to do. She says she will call the patient back and discuss all of this with him and thanked me for reaching out to the office about this situation.   Reason for Disposition . [1] MODERATE dizziness (e.g., vertigo; feels very unsteady, interferes with normal activities) AND [2] has NOT been evaluated by physician for this  Answer Assessment - Initial Assessment  Questions 1. DESCRIPTION: "Describe your dizziness."     Room swimming around 2. VERTIGO: "Do you feel like either you or the room is spinning or tilting?"      Yes 3. LIGHTHEADED: "Do you feel lightheaded?" (e.g., somewhat faint, woozy, weak upon standing)     Yes 4. SEVERITY: "How bad is it?"  "Can you walk?"   - MILD - Feels unsteady but walking normally.   - MODERATE - Feels very unsteady when walking, but not falling; interferes with normal activities (e.g., school, work) .   - SEVERE - Unable to walk without falling (requires assistance).     Moderate 5. ONSET:  "When did the dizziness begin?"     Yesterday 6. AGGRAVATING FACTORS: "Does anything make it worse?" (e.g., standing, change in head position)     Lying down, changing positions, standing 7. CAUSE: "What do you think is causing the dizziness?"     I don't know 8. RECURRENT SYMPTOM: "Have you had dizziness before?" If so, ask: "When was the last time?" "What happened that time?"     Yes, nothing like this 9. OTHER SYMPTOMS: "Do you have any other symptoms?" (e.g., headache, weakness, numbness, vomiting, earache)     Headache, nausea 10. PREGNANCY: "Is there any chance you are pregnant?" "When was your last menstrual period?"       N/A  Protocols used: DIZZINESS -  VERTIGO-A-AH

## 2018-07-08 NOTE — Progress Notes (Signed)
Benjamin Hahn - 84 y.o. male MRN [id number]  Date of birth: 1933-05-16  SUBJECTIVE:  Including CC & ROS.  No chief complaint on file.   Benjamin Hahn is a 84 y.o. male that is presenting with dizziness.  His symptoms have been ongoing for about a day.  Denies any trauma to his head.  He feels like the room is spinning when he gets up from a seated position or when he rolls over in bed.  Denies any fevers or chills.  Feels like his symptoms are intermittent.  He has had symptoms similar about a year ago.  Denies any facial drooping or numbness.  Has not tried any medications or maneuvers.    Review of Systems  Constitutional: Negative for fever.  HENT: Negative for congestion.   Respiratory: Negative for cough.   Cardiovascular: Negative for chest pain.  Gastrointestinal: Negative for abdominal pain.  Musculoskeletal: Negative for gait problem.  Skin: Negative for color change.  Neurological: Positive for dizziness and headaches.  Hematological: Negative for adenopathy.  Psychiatric/Behavioral: Negative for agitation.    HISTORY: Past Medical, Surgical, Social, and Family History Reviewed & Updated per EMR.   Pertinent Historical Findings include:  Past Medical History:  Diagnosis Date  . Anxiety   . Arthritis    might be in back, no problems  . BPH (benign prostatic hyperplasia)   . Depression 1990s  . GERD (gastroesophageal reflux disease)    occasional  . Hypercholesteremia    controled  . Hypothyroid   . Shingles May 2014   "Right face, still has some"  . Temporal arteritis ([place])   . Transient blindness of both eyes     Past Surgical History:  Procedure Laterality Date  . ARTERY BIOPSY Right 06/20/2013   Procedure: BIOPSY TEMPORAL ARTERY RIGHT;  Surgeon: Earnstine Regal, MD;  Location: [facility];  Service: General;  Laterality: Right;  . CARDIAC CATHETERIZATION N/A 09/02/2015   Procedure: Left Heart Cath and Coronary Angiography;  Surgeon: Charolette Forward, MD;   Location: [facility];  Service: Cardiovascular;  Laterality: N/A;  . CARDIAC CATHETERIZATION N/A 09/02/2015   Procedure: Coronary Stent Intervention;  Surgeon: Charolette Forward, MD;  Location: [facility];  Service: Cardiovascular;  Laterality: N/A;  1.  mid RCA      (3.0/28mm Xience) 2.  Mid LAD      (3.0/23mm Xience)  . NO PAST SURGERIES    . None      Allergies  Allergen Reactions  . Lorazepam     Family History  Problem Relation Age of Onset  . Stroke Father        Died, 80s  . Stroke Sister        Living, 58  . Heart disease Mother        Died, 38  . Healthy Daughter   . Diabetes Maternal Grandmother   . Hypertension Neg Hx      Social History   Socioeconomic History  . Marital status: Married    Spouse name: Not on file  . Number of children: Not on file  . Years of education: Not on file  . Highest education level: Not on file  Occupational History  . Not on file  Social Needs  . Financial resource strain: Not on file  . Food insecurity:    Worry: Not on file    Inability: Not on file  . Transportation needs:    Medical: Not on file  Non-medical: Not on file  Tobacco Use  . Smoking status: Former Smoker    Types: Cigarettes    Last attempt to quit: 08/24/1966    Years since quitting: 51.9  . Smokeless tobacco: Never Used  Substance and Sexual Activity  . Alcohol use: Yes    Alcohol/week: 2.0 standard drinks    Types: 2 Shots of liquor per week    Comment: socially  . Drug use: No  . Sexual activity: Not on file  Lifestyle  . Physical activity:    Days per week: Not on file    Minutes per session: Not on file  . Stress: Not on file  Relationships  . Social connections:    Talks on phone: Not on file    Gets together: Not on file    Attends religious service: Not on file    Active member of club or organization: Not on file    Attends meetings of clubs or organizations: Not on file    Relationship status: Not on file  . Intimate  partner violence:    Fear of current or ex partner: Not on file    Emotionally abused: Not on file    Physically abused: Not on file    Forced sexual activity: Not on file  Other Topics Concern  . Not on file  Social History Narrative   Currently works as a Midwife.  Lives with wife and they have two healthy daughters.     PHYSICAL EXAM:  VS: BP 140/84   Pulse 70   Resp 16   Wt 190 lb (86.2 kg)   SpO2 96%   BMI 29.32 kg/m  Physical Exam Gen: NAD, alert, cooperative with exam, well-appearing ENT: normal lips, normal nasal mucosa,  Eye: normal EOM, normal conjunctiva and lids CV:  no edema, +2 pedal pulses   Resp: no accessory muscle use, non-labored,   Skin: no rashes, no areas of induration  Neuro: normal tone, normal sensation to touch Psych:  normal insight, alert and oriented MSK: normal gait, normal strength       ASSESSMENT & PLAN:   Vertigo Symptoms seem to be consistent with vertigo.  More of a room spinning sensation. -Counseled on Epley maneuver. -Provided meclizine. -Provided indications when to seek more immediate care.

## 2018-07-08 NOTE — Telephone Encounter (Signed)
Patient would like to keep care at the [facility] location.  Patient is requesting to establish care with Dr. Ronnald Ramp.  Please advise.

## 2018-07-10 DIAGNOSIS — R42 Dizziness and giddiness: Secondary | ICD-10-CM | POA: Insufficient documentation

## 2018-07-10 NOTE — Assessment & Plan Note (Signed)
Symptoms seem to be consistent with vertigo.  More of a room spinning sensation. -Counseled on Epley maneuver. -Provided meclizine. -Provided indications when to seek more immediate care.

## 2018-07-10 NOTE — Telephone Encounter (Signed)
Yes, I will see him.

## 2018-07-13 NOTE — Telephone Encounter (Signed)
Patient scheduled.

## 2018-07-14 DIAGNOSIS — E785 Hyperlipidemia, unspecified: Secondary | ICD-10-CM | POA: Diagnosis not present

## 2018-07-14 DIAGNOSIS — R7989 Other specified abnormal findings of blood chemistry: Secondary | ICD-10-CM | POA: Diagnosis not present

## 2018-07-14 DIAGNOSIS — J45909 Unspecified asthma, uncomplicated: Secondary | ICD-10-CM | POA: Diagnosis not present

## 2018-07-14 DIAGNOSIS — N189 Chronic kidney disease, unspecified: Secondary | ICD-10-CM | POA: Diagnosis not present

## 2018-07-14 DIAGNOSIS — M1990 Unspecified osteoarthritis, unspecified site: Secondary | ICD-10-CM | POA: Diagnosis not present

## 2018-07-14 DIAGNOSIS — I25118 Atherosclerotic heart disease of native coronary artery with other forms of angina pectoris: Secondary | ICD-10-CM | POA: Diagnosis not present

## 2018-07-14 DIAGNOSIS — I10 Essential (primary) hypertension: Secondary | ICD-10-CM | POA: Diagnosis not present

## 2018-07-18 ENCOUNTER — Other Ambulatory Visit: Payer: Self-pay | Admitting: Endocrinology

## 2018-07-25 ENCOUNTER — Other Ambulatory Visit: Payer: Self-pay | Admitting: Endocrinology

## 2018-07-28 ENCOUNTER — Other Ambulatory Visit (INDEPENDENT_AMBULATORY_CARE_PROVIDER_SITE_OTHER): Payer: Medicare Other

## 2018-07-28 ENCOUNTER — Ambulatory Visit (INDEPENDENT_AMBULATORY_CARE_PROVIDER_SITE_OTHER): Payer: Medicare Other | Admitting: Internal Medicine

## 2018-07-28 ENCOUNTER — Telehealth: Payer: Self-pay | Admitting: Internal Medicine

## 2018-07-28 ENCOUNTER — Encounter: Payer: Self-pay | Admitting: Internal Medicine

## 2018-07-28 VITALS — BP 120/70 | HR 74 | Temp 98.7°F | Resp 16 | Ht 67.5 in | Wt 188.8 lb

## 2018-07-28 DIAGNOSIS — E785 Hyperlipidemia, unspecified: Secondary | ICD-10-CM

## 2018-07-28 DIAGNOSIS — Z23 Encounter for immunization: Secondary | ICD-10-CM

## 2018-07-28 DIAGNOSIS — E039 Hypothyroidism, unspecified: Secondary | ICD-10-CM | POA: Diagnosis not present

## 2018-07-28 LAB — LIPID PANEL
Cholesterol: 139 mg/dL (ref 0–200)
HDL: 62.4 mg/dL (ref >39.00)
LDL CALC: 52 mg/dL (ref 0–99)
NonHDL: 76.59
Total CHOL/HDL Ratio: 2
Triglycerides: 122 mg/dL (ref 0.0–149.0)
VLDL: 24.4 mg/dL (ref 0.0–40.0)

## 2018-07-28 LAB — COMPREHENSIVE METABOLIC PANEL
ALT: 33 U/L (ref 0–53)
AST: 20 U/L (ref 0–37)
Albumin: 4.2 g/dL (ref 3.5–5.2)
Alkaline Phosphatase: 56 U/L (ref 39–117)
BUN: 35 mg/dL — ABNORMAL HIGH (ref 6–23)
CO2: 30 meq/L (ref 19–32)
Calcium: 9 mg/dL (ref 8.4–10.5)
Chloride: 104 mEq/L (ref 96–112)
Creatinine, Ser: 1.33 mg/dL (ref 0.40–1.50)
GFR: 54.51 mL/min — ABNORMAL LOW (ref >60.00)
Glucose, Bld: 101 mg/dL — ABNORMAL HIGH (ref 70–99)
Potassium: 4.3 mEq/L (ref 3.5–5.1)
Sodium: 139 mEq/L (ref 135–145)
Total Bilirubin: 0.5 mg/dL (ref 0.2–1.2)
Total Protein: 6.9 g/dL (ref 6.0–8.3)

## 2018-07-28 LAB — CBC WITH DIFFERENTIAL/PLATELET
Basophils Absolute: 0.1 10*3/uL (ref 0.0–0.1)
Basophils Relative: 0.9 % (ref 0.0–3.0)
EOS PCT: 2.6 % (ref 0.0–5.0)
Eosinophils Absolute: 0.2 10*3/uL (ref 0.0–0.7)
HCT: 41.3 % (ref 39.0–52.0)
Hemoglobin: 13.8 g/dL (ref 13.0–17.0)
Lymphocytes Relative: 22.3 % (ref 12.0–46.0)
Lymphs Abs: 1.6 10*3/uL (ref 0.7–4.0)
MCHC: 33.5 g/dL (ref 30.0–36.0)
MCV: 91.3 fl (ref 78.0–100.0)
Monocytes Absolute: 0.6 10*3/uL (ref 0.1–1.0)
Monocytes Relative: 9.1 % (ref 3.0–12.0)
Neutro Abs: 4.6 10*3/uL (ref 1.4–7.7)
Neutrophils Relative %: 65.1 % (ref 43.0–77.0)
Platelets: 205 10*3/uL (ref 150.0–400.0)
RBC: 4.52 Mil/uL (ref 4.22–5.81)
RDW: 13.1 % (ref 11.5–15.5)
WBC: 7 10*3/uL (ref 4.0–10.5)

## 2018-07-28 LAB — TSH: TSH: 1.72 u[IU]/mL (ref 0.35–4.50)

## 2018-07-28 MED ORDER — TESTOSTERONE CYPIONATE 200 MG/ML IM SOLN: 200.0000 mg | INTRAMUSCULAR | 0 refills | Status: DC

## 2018-07-28 NOTE — Progress Notes (Signed)
Subjective:  Patient ID: Benjamin Hahn, male    DOB: [birth year]  Age: 84 y.o. MRN: [id number]  CC: Hyperlipidemia and Hypothyroidism    HPI Benjamin Hahn presents for f/up - He wants to establish a primary care relationship.  His only recent complaints or weight gain and fatigue.  Outpatient Medications Prior to Visit  Medication Sig Dispense Refill  . acetaminophen (TYLENOL) 500 MG tablet Take 500 mg by mouth daily as needed (pain).    [name] ALPRAZolam (XANAX) 0.25 MG tablet Take 1 tablet (0.25 mg total) by mouth at bedtime as needed for anxiety. 30 tablet 4  . aspirin 81 MG chewable tablet Chew 1 tablet (81 mg total) by mouth daily. 30 tablet 3  . atorvastatin (LIPITOR) 40 MG tablet Take 1 tablet (40 mg total) by mouth daily at 6 PM. 30 tablet 3  . buPROPion (WELLBUTRIN XL) 150 MG 24 hr tablet Take 1 tablet (150 mg total) by mouth every morning. 30 tablet 6  . clobetasol cream (TEMOVATE) 0.05 % APPLY AS DIRECTED. 30 g 0  . disulfiram (ANTABUSE) 250 MG tablet Take 1 tablet (250 mg total) by mouth daily. 30 tablet 0  . hydrOXYzine (ATARAX/VISTARIL) 10 MG tablet TAKE 1 TABLET ONCE DAILY FOR ITCHING 30 tablet 0  . imipramine (TOFRANIL) 10 MG tablet 2 q hs 60 tablet 5  . levothyroxine (SYNTHROID, LEVOTHROID) 88 MCG tablet TAKE 1 TABLET BY MOUTH DAILY. 90 tablet 1  . meclizine (ANTIVERT) 25 MG tablet Take 1 tablet (25 mg total) by mouth 2 (two) times daily as needed for dizziness. 10 tablet 0  . metoprolol succinate (TOPROL-XL) 25 MG 24 hr tablet TAKE (1/2) TABLET DAILY. 30 tablet 0  . mupirocin ointment (BACTROBAN) 2 % Apply 1 application topically 2 (two) times daily. (Patient taking differently: Apply 1 application topically 2 (two) times daily. ) 22 g 0  . ramipril (ALTACE) 1.25 MG capsule Take 1 capsule (1.25 mg total) by mouth daily. 30 capsule 3  . tamsulosin (FLOMAX) 0.4 MG CAPS capsule TAKE (1) CAPSULE DAILY. (Patient taking differently: TAKE (2) CAPSULES  DAILY AFTER SUPPER)  30 capsule 3  . temazepam (RESTORIL) 15 MG capsule Take 1 capsule (15 mg total) by mouth at bedtime as needed for sleep. 30 capsule 4  . venlafaxine XR (EFFEXOR-XR) 37.5 MG 24 hr capsule Take 1 capsule (37.5 mg total) by mouth daily. 30 capsule 5  . nitroGLYCERIN (NITROSTAT) 0.4 MG SL tablet Place 1 tablet (0.4 mg total) under the tongue every 5 (five) minutes as needed for chest pain (CP or SOB). (Patient not taking: Reported on 07/28/2018) 25 tablet 12  . testosterone cypionate (DEPOTESTOSTERONE CYPIONATE) 200 MG/ML injection Inject 0.75 MLs (150 mg total) into the muscle once every two weeks. 10 mL 3   Facility-Administered Medications Prior to Visit  Medication Dose Route Frequency Provider Last Rate Last Dose  . testosterone cypionate (DEPOTESTOSTERONE CYPIONATE) injection 200 mg  200 mg Intramuscular Q14 Days Elayne Snare, MD   200 mg at 04/28/18 1529    ROS Review of Systems  Constitutional: Positive for fatigue and unexpected weight change. Negative for appetite change, diaphoresis and fever.  HENT: Negative.   Eyes: Negative for visual disturbance.  Respiratory: Negative for cough, chest tightness, shortness of breath and wheezing.   Cardiovascular: Negative.  Negative for chest pain, palpitations and leg swelling.  Gastrointestinal: Negative for abdominal pain, constipation, diarrhea, nausea and vomiting.  Endocrine: Negative for cold intolerance and heat intolerance.  Genitourinary: Negative.  Negative for difficulty urinating.  Musculoskeletal: Negative.  Negative for arthralgias and myalgias.  Skin: Negative.  Negative for color change.  Neurological: Negative.  Negative for dizziness, weakness and light-headedness.  Hematological: Negative for adenopathy. Does not bruise/bleed easily.  Psychiatric/Behavioral: Negative.     Objective:  BP 120/70 (BP Location: Left Arm, Patient Position: Sitting, Cuff Size: Normal)   Pulse 74   Temp 98.7 F (37.1 C) (Oral)   Resp 16   Ht  5' 7.5" (1.715 m)   Wt 188 lb 12 oz (85.6 kg)   SpO2 94%   BMI 29.13 kg/m   BP Readings from Last 3 Encounters:  07/28/18 120/70  07/08/18 140/84  03/10/18 118/60    Wt Readings from Last 3 Encounters:  07/28/18 188 lb 12 oz (85.6 kg)  07/08/18 190 lb (86.2 kg)  03/10/18 184 lb 9.6 oz (83.7 kg)    Physical Exam  Constitutional: He is oriented to person, place, and time. No distress.  HENT:  Mouth/Throat: Oropharynx is clear and moist. No oropharyngeal exudate.  Eyes: Conjunctivae are normal. No scleral icterus.  Neck: Normal range of motion. Neck supple. No JVD present. No thyromegaly present.  Cardiovascular: Normal rate, regular rhythm and normal heart sounds. Exam reveals no gallop.  No murmur heard. Pulmonary/Chest: Effort normal and breath sounds normal. No respiratory distress. He has no wheezes. He has no rales.  Abdominal: Soft. Bowel sounds are normal. He exhibits no mass. There is no hepatosplenomegaly. There is no tenderness.  Musculoskeletal: Normal range of motion. He exhibits no edema, tenderness or deformity.  Lymphadenopathy:    He has no cervical adenopathy.  Neurological: He is alert and oriented to person, place, and time.  Skin: Skin is warm and dry. No rash noted. He is not diaphoretic.  Vitals reviewed.   Lab Results  Component Value Date   WBC 7.0 07/28/2018   HGB 13.8 07/28/2018   HCT 41.3 07/28/2018   PLT 205.0 07/28/2018   GLUCOSE 101 (H) 07/28/2018   CHOL 139 07/28/2018   TRIG 122.0 07/28/2018   HDL 62.40 07/28/2018   LDLCALC 52 07/28/2018   ALT 33 07/28/2018   AST 20 07/28/2018   NA 139 07/28/2018   K 4.3 07/28/2018   CL 104 07/28/2018   CREATININE 1.33 07/28/2018   BUN 35 (H) 07/28/2018   CO2 30 07/28/2018   TSH 1.72 07/28/2018   INR 3.18 (H) 09/02/2015   HGBA1C 5.9 11/01/2017    Dg Chest 2 View  Result Date: 02/08/2018 CLINICAL DATA:  Low-grade fever and cough EXAM: CHEST - 2 VIEW COMPARISON:  08/31/2015 FINDINGS: Cardiac  shadow is stable. The lungs are well aerated bilaterally. Minimal scarring is noted in left base. No focal infiltrate or sizable effusion is seen. No acute bony abnormality is noted. IMPRESSION: No acute abnormality noted. Electronically Signed   By: Inez Catalina M.D.   On: 02/08/2018 14:47    Assessment & Plan:   Graylin was seen today for hyperlipidemia and hypothyroidism.  Diagnoses and all orders for this visit:  Acquired hypothyroidism- His TSH is in the normal range.  He will remain on the current dose of levothyroxine. -     TSH; Future  Hyperlipidemia LDL goal <70- He has achieved his LDL goal and is doing well on the statin. -     Lipid panel; Future -     CBC with Differential/Platelet; Future -     Comprehensive metabolic panel; Future -     TSH; Future  Need for pneumococcal vaccination -     Pneumococcal polysaccharide vaccine 23-valent greater than or equal to 2yo subcutaneous/IM   I have discontinued Cristopher Estimable. Locey's testosterone cypionate. I am also having him maintain his tamsulosin, acetaminophen, aspirin, atorvastatin, nitroGLYCERIN, ramipril, mupirocin ointment, imipramine, buPROPion, levothyroxine, disulfiram, ALPRAZolam, temazepam, venlafaxine XR, hydrOXYzine, meclizine, metoprolol succinate, and clobetasol cream. We will continue to administer testosterone cypionate.  No orders of the defined types were placed in this encounter.    Follow-up: Return in about 6 months (around 01/27/2019).  Scarlette Calico, MD

## 2018-07-28 NOTE — Telephone Encounter (Signed)
Patient is scheduled for wed 08/03/2018 for test injection.  Patient would like to make sure script for testosterone is called into [facility].  Patient is heading down to lab now.

## 2018-07-28 NOTE — Addendum Note (Signed)
Addended by: Aviva Signs M on: 07/28/2018 03:58 PM   Modules accepted: Orders

## 2018-07-28 NOTE — Telephone Encounter (Signed)
rx has been faxed to [facility] as requested.

## 2018-07-28 NOTE — Telephone Encounter (Signed)
rx has been printed for pcp to sign.

## 2018-07-28 NOTE — Patient Instructions (Signed)
Hypothyroidism Hypothyroidism is a disorder of the thyroid. The thyroid is a large gland that is located in the lower front of the neck. The thyroid releases hormones that control how the body works. With hypothyroidism, the thyroid does not make enough of these hormones. What are the causes? Causes of hypothyroidism may include:  Viral infections.  Pregnancy.  Your own defense system (immune system) attacking your thyroid.  Certain medicines.  Birth defects.  Past radiation treatments to your head or neck.  Past treatment with radioactive iodine.  Past surgical removal of part or all of your thyroid.  Problems with the gland that is located in the center of your brain (pituitary).  What are the signs or symptoms? Signs and symptoms of hypothyroidism may include:  Feeling as though you have no energy (lethargy).  Inability to tolerate cold.  Weight gain that is not explained by a change in diet or exercise habits.  Dry skin.  Coarse hair.  Menstrual irregularity.  Slowing of thought processes.  Constipation.  Sadness or depression.  How is this diagnosed? Your health care provider may diagnose hypothyroidism with blood tests and ultrasound tests. How is this treated? Hypothyroidism is treated with medicine that replaces the hormones that your body does not make. After you begin treatment, it may take several weeks for symptoms to go away. Follow these instructions at home:  Take medicines only as directed by your health care provider.  If you start taking any new medicines, tell your health care provider.  Keep all follow-up visits as directed by your health care provider. This is important. As your condition improves, your dosage needs may change. You will need to have blood tests regularly so that your health care provider can watch your condition. Contact a health care provider if:  Your symptoms do not get better with treatment.  You are taking thyroid  replacement medicine and: ? You sweat excessively. ? You have tremors. ? You feel anxious. ? You lose weight rapidly. ? You cannot tolerate heat. ? You have emotional swings. ? You have diarrhea. ? You feel weak. Get help right away if:  You develop chest pain.  You develop an irregular heartbeat.  You develop a rapid heartbeat. This information is not intended to replace advice given to you by your health care provider. Make sure you discuss any questions you have with your health care provider. Document Released: 08/10/2005 Document Revised: 01/16/2016 Document Reviewed: 12/26/2013 Elsevier Interactive Patient Education  2018 [employer]vier Inc[employer]  

## 2018-07-29 ENCOUNTER — Encounter: Payer: Self-pay | Admitting: Internal Medicine

## 2018-08-02 ENCOUNTER — Ambulatory Visit: Payer: Self-pay

## 2018-08-03 ENCOUNTER — Ambulatory Visit (INDEPENDENT_AMBULATORY_CARE_PROVIDER_SITE_OTHER): Payer: Medicare Other

## 2018-08-03 DIAGNOSIS — E291 Testicular hypofunction: Secondary | ICD-10-CM | POA: Diagnosis not present

## 2018-08-03 MED ORDER — TESTOSTERONE CYPIONATE 200 MG/ML IM SOLN
200.0000 mg | Freq: Once | INTRAMUSCULAR | Status: AC
  Administered 2018-08-03: 200 mg via INTRAMUSCULAR

## 2018-08-03 NOTE — Progress Notes (Signed)
I have reviewed and agree.

## 2018-08-15 ENCOUNTER — Other Ambulatory Visit (HOSPITAL_COMMUNITY): Payer: Self-pay

## 2018-08-15 MED ORDER — IMIPRAMINE HCL 10 MG PO TABS: ORAL_TABLET | ORAL | 1 refills | Status: DC

## 2018-08-22 ENCOUNTER — Ambulatory Visit (INDEPENDENT_AMBULATORY_CARE_PROVIDER_SITE_OTHER): Payer: Medicare Other

## 2018-08-22 DIAGNOSIS — E291 Testicular hypofunction: Secondary | ICD-10-CM

## 2018-08-22 MED ORDER — TESTOSTERONE CYPIONATE 200 MG/ML IM SOLN
200.0000 mg | Freq: Once | INTRAMUSCULAR | Status: AC
  Administered 2018-08-22: 200 mg via INTRAMUSCULAR

## 2018-08-22 NOTE — Progress Notes (Signed)
I have reviewed and agree.

## 2018-08-30 ENCOUNTER — Ambulatory Visit (INDEPENDENT_AMBULATORY_CARE_PROVIDER_SITE_OTHER): Payer: Medicare Other | Admitting: Family

## 2018-08-30 ENCOUNTER — Encounter: Payer: Self-pay | Admitting: Family

## 2018-08-30 VITALS — BP 130/68 | HR 90 | Temp 98.6°F | Ht 67.5 in

## 2018-08-30 DIAGNOSIS — J4521 Mild intermittent asthma with (acute) exacerbation: Secondary | ICD-10-CM

## 2018-08-30 DIAGNOSIS — J019 Acute sinusitis, unspecified: Secondary | ICD-10-CM

## 2018-08-30 DIAGNOSIS — J209 Acute bronchitis, unspecified: Secondary | ICD-10-CM | POA: Diagnosis not present

## 2018-08-30 MED ORDER — HYDROCODONE-HOMATROPINE 5-1.5 MG/5ML PO SYRP: 5.0000 mL | ORAL_SOLUTION | Freq: Three times a day (TID) | ORAL | 0 refills | Status: DC | PRN

## 2018-08-30 MED ORDER — AMOXICILLIN-POT CLAVULANATE 875-125 MG PO TABS: 1.0000 | ORAL_TABLET | Freq: Two times a day (BID) | ORAL | 0 refills | Status: DC

## 2018-08-30 NOTE — Progress Notes (Signed)
Benjamin Hahn is a 85 y.o. male with the following history as recorded in Epic[facility]:  Patient Active Problem List   Diagnosis Date Noted  . Mild intermittent asthma with acute exacerbation 02/08/2018  . Hypothyroidism 03/14/2013  . Hyperlipidemia LDL goal <70 03/14/2013  . BPH (benign prostatic hypertrophy) 03/14/2013  . Hypogonadism, male 03/14/2013  . Erectile dysfunction 03/14/2013    Current Outpatient Medications  Medication Sig Dispense Refill  . acetaminophen (TYLENOL) 500 MG tablet Take 500 mg by mouth daily as needed (pain).    [name] ALPRAZolam (XANAX) 0.25 MG tablet Take 1 tablet (0.25 mg total) by mouth at bedtime as needed for anxiety. 30 tablet 4  . aspirin 81 MG chewable tablet Chew 1 tablet (81 mg total) by mouth daily. 30 tablet 3  . atorvastatin (LIPITOR) 40 MG tablet Take 1 tablet (40 mg total) by mouth daily at 6 PM. 30 tablet 3  . buPROPion (WELLBUTRIN XL) 150 MG 24 hr tablet Take 1 tablet (150 mg total) by mouth every morning. 30 tablet 6  . clobetasol cream (TEMOVATE) 0.05 % APPLY AS DIRECTED. 30 g 0  . disulfiram (ANTABUSE) 250 MG tablet Take 1 tablet (250 mg total) by mouth daily. 30 tablet 0  . hydrOXYzine (ATARAX/VISTARIL) 10 MG tablet TAKE 1 TABLET ONCE DAILY FOR ITCHING 30 tablet 0  . imipramine (TOFRANIL) 10 MG tablet 2 q hs 60 tablet 1  . levothyroxine (SYNTHROID, LEVOTHROID) 88 MCG tablet TAKE 1 TABLET BY MOUTH DAILY. 90 tablet 1  . meclizine (ANTIVERT) 25 MG tablet Take 1 tablet (25 mg total) by mouth 2 (two) times daily as needed for dizziness. 10 tablet 0  . metoprolol succinate (TOPROL-XL) 25 MG 24 hr tablet TAKE (1/2) TABLET DAILY. 30 tablet 0  . mupirocin ointment (BACTROBAN) 2 % Apply 1 application topically 2 (two) times daily. (Patient taking differently: Apply 1 application topically 2 (two) times daily. ) 22 g 0  . nitroGLYCERIN (NITROSTAT) 0.4 MG SL tablet Place 1 tablet (0.4 mg total) under the tongue every 5 (five) minutes as needed for chest  pain (CP or SOB). 25 tablet 12  . ramipril (ALTACE) 1.25 MG capsule Take 1 capsule (1.25 mg total) by mouth daily. 30 capsule 3  . tamsulosin (FLOMAX) 0.4 MG CAPS capsule TAKE (1) CAPSULE DAILY. (Patient taking differently: TAKE (2) CAPSULES  DAILY AFTER SUPPER) 30 capsule 3  . temazepam (RESTORIL) 15 MG capsule Take 1 capsule (15 mg total) by mouth at bedtime as needed for sleep. 30 capsule 4  . testosterone cypionate (DEPOTESTOSTERONE CYPIONATE) 200 MG/ML injection Inject 1 mL (200 mg total) into the muscle every 14 (fourteen) days. 6 mL 0  . venlafaxine XR (EFFEXOR-XR) 37.5 MG 24 hr capsule Take 1 capsule (37.5 mg total) by mouth daily. 30 capsule 5  . amoxicillin-clavulanate (AUGMENTIN) 875-125 MG tablet Take 1 tablet by mouth 2 (two) times daily. 20 tablet 0  . HYDROcodone-homatropine (HYCODAN) 5-1.5 MG/5ML syrup Take 5 mLs by mouth every 8 (eight) hours as needed for cough. 120 mL 0   Current Facility-Administered Medications  Medication Dose Route Frequency Provider Last Rate Last Dose  . testosterone cypionate (DEPOTESTOSTERONE CYPIONATE) injection 200 mg  200 mg Intramuscular Q14 Days Elayne Snare, MD   200 mg at 04/28/18 1529    Allergies: Lorazepam  Past Medical History:  Diagnosis Date  . Anxiety   . Arthritis    might be in back, no problems  . BPH (benign prostatic hyperplasia)   . Depression  1990s  . GERD (gastroesophageal reflux disease)    occasional  . Hypercholesteremia    controled  . Hypothyroid   . Shingles May 2014   "Right face, still has some"  . Temporal arteritis ([place])   . Transient blindness of both eyes     Past Surgical History:  Procedure Laterality Date  . ARTERY BIOPSY Right 06/20/2013   Procedure: BIOPSY TEMPORAL ARTERY RIGHT;  Surgeon: Earnstine Regal, MD;  Location: [facility];  Service: General;  Laterality: Right;  . CARDIAC CATHETERIZATION N/A 09/02/2015   Procedure: Left Heart Cath and Coronary Angiography;  Surgeon: Charolette Forward, MD;  Location: [place] [facility];  Service: Cardiovascular;  Laterality: N/A;  . CARDIAC CATHETERIZATION N/A 09/02/2015   Procedure: Coronary Stent Intervention;  Surgeon: Charolette Forward, MD;  Location: [place] [facility];  Service: Cardiovascular;  Laterality: N/A;  1.  mid RCA      (3.0/28mm Xience) 2.  Mid LAD      (3.0/23mm Xience)  . NO PAST SURGERIES    . None      Family History  Problem Relation Age of Onset  . Stroke Father        Died, 80s  . Stroke Sister        Living, 2  . Heart disease Mother        Died, 50  . Healthy Daughter   . Diabetes Maternal Grandmother   . Hypertension Neg Hx     Social History   Tobacco Use  . Smoking status: Former Smoker    Types: Cigarettes    Last attempt to quit: 08/24/1966    Years since quitting: 52.0  . Smokeless tobacco: Never Used  Substance Use Topics  . Alcohol use: Yes    Alcohol/week: 2.0 standard drinks    Types: 2 Shots of liquor per week    Comment: socially    Subjective:  Patient presents with concerns for 5 day history of cough/ congestion; notes that every winter, he gets a "bad cough" and requires an antibiotic. No chest pain, no shortness of breath; does have history of intermittent asthma- "years ago." Had similar symptoms earlier this year- requesting the same medications if possible-notes he did not fill the inhaler that was prescribed at that visit however.     Objective:  Vitals:   08/30/18 0923  BP: 130/68  Pulse: 90  Temp: 98.6 F (37 C)  TempSrc: Oral  SpO2: 96%  Height: 5' 7.5" (1.715 m)    General: Well developed, well nourished, in no acute distress  Skin : Warm and dry.  Head: Normocephalic and atraumatic  Eyes: Sclera and conjunctiva clear; pupils round and reactive to light; extraocular movements intact  Ears: External normal; canals clear; tympanic membranes normal  Oropharynx: Pink, supple. No suspicious lesions  Neck: Supple without thyromegaly, adenopathy  Lungs: Respirations unlabored; clear to  auscultation bilaterally without wheeze, rales, rhonchi  CVS exam: normal rate and regular rhythm.  Neurologic: Alert and oriented; speech intact; face symmetrical; moves all extremities well; CNII-XII intact without focal deficit   Assessment:  1. Acute sinusitis, recurrence not specified, unspecified location   2. Acute bronchitis, unspecified organism   3. Mild intermittent asthma with acute exacerbation     Plan:  Rx for Augmentin 875 mg bid x 10 days; Rx for Hycodan cough syrup use as directed; stressed not to mix with his Restoril and Xanax; per patient, he is not taking Antabuse at this time; sample of BREO  200 mg qd- use daily x 14 days as directed.   No follow-ups on file.  No orders of the defined types were placed in this encounter.   Requested Prescriptions   Signed Prescriptions Disp Refills  . amoxicillin-clavulanate (AUGMENTIN) 875-125 MG tablet 20 tablet 0    Sig: Take 1 tablet by mouth 2 (two) times daily.  [name] HYDROcodone-homatropine (HYCODAN) 5-1.5 MG/5ML syrup 120 mL 0    Sig: Take 5 mLs by mouth every 8 (eight) hours as needed for cough.

## 2018-08-31 ENCOUNTER — Ambulatory Visit: Payer: Self-pay

## 2018-08-31 MED ORDER — CEFDINIR 300 MG PO CAPS: 300.0000 mg | ORAL_CAPSULE | Freq: Two times a day (BID) | ORAL | 0 refills | Status: DC

## 2018-08-31 NOTE — Telephone Encounter (Signed)
Spoke with patient and info given. He will call us back if script doesn't work.

## 2018-08-31 NOTE — Telephone Encounter (Signed)
I am sorry about that- unfortunately, I don't really have a way of knowing about size of medications. It looks like he took this medication fine in June which is why we opted to use it again. We could try a capsule to see if that is easier to swallow. Sending in Cefdinir 300 mg bid x 10 days.

## 2018-08-31 NOTE — Telephone Encounter (Signed)
Received call that patient was at pharmacy, but they never received Cefdinir script. Rx previously sent as "phone in". I sent Cefdinir electronically. See meds. Advised [facility] to inform patient.

## 2018-08-31 NOTE — Telephone Encounter (Signed)
Reports the Augmentin is "too big for me to swallow - even if I break it up,I can't swallow it. I don't want to spend a lot of money on liquid medicines either." Requests a smaller antibiotic pill be sent to his pharmacy. Please advise pt.

## 2018-08-31 NOTE — Addendum Note (Signed)
Addended by: Cresenciano Lick on: 08/31/2018 04:28 PM   Modules accepted: Orders

## 2018-09-05 ENCOUNTER — Telehealth (HOSPITAL_COMMUNITY): Payer: Self-pay

## 2018-09-05 NOTE — Telephone Encounter (Signed)
Patient is calling, he states that the Temazepam prescription is too much at 15 mg and he would like to go down to 7.5 mg. Please review and advise, thank you

## 2018-09-06 NOTE — Telephone Encounter (Signed)
D/C restoril and add 0.5 Ativan qhs

## 2018-09-08 ENCOUNTER — Other Ambulatory Visit: Payer: Self-pay | Admitting: Endocrinology

## 2018-09-13 ENCOUNTER — Other Ambulatory Visit (HOSPITAL_COMMUNITY): Payer: Self-pay

## 2018-09-13 MED ORDER — TEMAZEPAM 7.5 MG PO CAPS: 7.5000 mg | ORAL_CAPSULE | Freq: Every evening | ORAL | 4 refills | Status: DC | PRN

## 2018-09-14 ENCOUNTER — Other Ambulatory Visit: Payer: Self-pay | Admitting: Endocrinology

## 2018-09-14 ENCOUNTER — Ambulatory Visit (INDEPENDENT_AMBULATORY_CARE_PROVIDER_SITE_OTHER): Payer: Medicare Other

## 2018-09-14 DIAGNOSIS — E291 Testicular hypofunction: Secondary | ICD-10-CM

## 2018-09-14 MED ORDER — TESTOSTERONE CYPIONATE 200 MG/ML IM SOLN
200.0000 mg | Freq: Once | INTRAMUSCULAR | Status: AC
  Administered 2018-09-14: 200 mg via INTRAMUSCULAR

## 2018-09-14 NOTE — Telephone Encounter (Signed)
Rx sent with no refills and note to pharmacy stating that patient must be seen for future refills.

## 2018-09-14 NOTE — Progress Notes (Signed)
I have reviewed and agree.

## 2018-09-14 NOTE — Telephone Encounter (Signed)
His last visit was in 7/19, needs to be seen for follow-up with labs.  Please send 30-day prescription without refills

## 2018-09-14 NOTE — Telephone Encounter (Signed)
Spoke with patient, he can not take Lorazepam. I called Dr. Casimiro Needle and he said to call in the Temazepam 7.5 mg for the patient. I did that and called the patient, he thanked me and will call back with any issues.

## 2018-09-14 NOTE — Telephone Encounter (Signed)
Pt has not been seen since 05/2018 with no existing appts.  Refill or deny?

## 2018-09-16 ENCOUNTER — Other Ambulatory Visit: Payer: Self-pay

## 2018-09-16 MED ORDER — LEVOTHYROXINE SODIUM 88 MCG PO TABS: 88.0000 ug | ORAL_TABLET | Freq: Every day | ORAL | 0 refills | Status: DC

## 2018-09-21 ENCOUNTER — Other Ambulatory Visit (HOSPITAL_COMMUNITY): Payer: Self-pay

## 2018-09-21 MED ORDER — TEMAZEPAM 7.5 MG PO CAPS: 7.5000 mg | ORAL_CAPSULE | Freq: Every evening | ORAL | 4 refills | Status: DC | PRN

## 2018-09-21 NOTE — Progress Notes (Unsigned)
Patient can not afford 7.5 mg Temazepam and would like to go back to the 15 mg.

## 2018-09-27 ENCOUNTER — Ambulatory Visit: Payer: Self-pay

## 2018-09-28 ENCOUNTER — Ambulatory Visit (INDEPENDENT_AMBULATORY_CARE_PROVIDER_SITE_OTHER): Payer: Medicare Other | Admitting: Emergency Medicine

## 2018-09-28 DIAGNOSIS — E291 Testicular hypofunction: Secondary | ICD-10-CM

## 2018-09-28 MED ORDER — TESTOSTERONE CYPIONATE 200 MG/ML IM SOLN
200.0000 mg | INTRAMUSCULAR | Status: DC
  Administered 2018-09-28: 200 mg via INTRAMUSCULAR

## 2018-09-28 NOTE — Progress Notes (Signed)
Medical treatment/procedure(s) were performed by non-physician practitioner and as supervising physician I was immediately available for consultation/collaboration. I agree with above. Lorita Forinash A Kristene Liberati, MD  

## 2018-09-29 ENCOUNTER — Other Ambulatory Visit (HOSPITAL_COMMUNITY): Payer: Self-pay

## 2018-09-29 MED ORDER — BUPROPION HCL ER (XL) 150 MG PO TB24: 150.0000 mg | ORAL_TABLET | ORAL | 2 refills | Status: DC

## 2018-10-12 ENCOUNTER — Ambulatory Visit: Payer: Self-pay

## 2018-10-12 ENCOUNTER — Encounter (HOSPITAL_COMMUNITY): Payer: Self-pay | Admitting: Psychiatry

## 2018-10-12 ENCOUNTER — Ambulatory Visit (INDEPENDENT_AMBULATORY_CARE_PROVIDER_SITE_OTHER): Payer: Medicare Other | Admitting: Psychiatry

## 2018-10-12 VITALS — BP 121/62 | HR 72 | Ht 67.5 in | Wt 185.0 lb

## 2018-10-12 DIAGNOSIS — F339 Major depressive disorder, recurrent, unspecified: Secondary | ICD-10-CM | POA: Diagnosis not present

## 2018-10-12 MED ORDER — TEMAZEPAM 15 MG PO CAPS: 7.5000 mg | ORAL_CAPSULE | Freq: Every evening | ORAL | 4 refills | Status: DC | PRN

## 2018-10-12 MED ORDER — IMIPRAMINE HCL 10 MG PO TABS: ORAL_TABLET | ORAL | 5 refills | Status: DC

## 2018-10-12 MED ORDER — VENLAFAXINE HCL ER 37.5 MG PO CP24: 37.5000 mg | ORAL_CAPSULE | Freq: Every day | ORAL | 6 refills | Status: DC

## 2018-10-12 MED ORDER — ALPRAZOLAM 0.25 MG PO TABS: ORAL_TABLET | ORAL | 4 refills | Status: DC

## 2018-10-12 NOTE — Progress Notes (Signed)
Patient ID: Benjamin Hahn, male   DOB: Mar 11, 1933, 85 y.o.   MRN: [id number] [facility] MD/PA/NP OP Progress Note  10/12/2018 4:28 PM BOBBY BARTON  MRN:  [id number]  Chief Complaint: Major depression Subjective: Doing great  Today the patient announces that he has discontinued all alcohol.  He was actually drinking about 4 drinks a night.  He did share that with me.  He stopped his alcohol.  His mood is actually good.  He is happily married.  He continues to work at 85 years of age.  He likes his work.  He denies being sad or down or low.  Uses no drugs.  Patient has no evidence of psychosis.  He is got good energy.  He can think and concentrate well.  He is a good sense of work.  He has difficulty sleeping at times but takes an appropriate amount of medicines for this.  He is not suicidal.  He is not irritable.  Is a good relationship with his family.  Patient is positive and optimistic.  He is not fatigued.   Past Medical History:  Diagnosis Date  . Anxiety   . Arthritis    might be in back, no problems  . BPH (benign prostatic hyperplasia)   . Depression 1990s  . GERD (gastroesophageal reflux disease)    occasional  . Hypercholesteremia    controled  . Hypothyroid   . Shingles May 2014   "Right face, still has some"  . Temporal arteritis ([employer])   . Transient blindness of both eyes     Past Surgical History:  Procedure Laterality Date  . ARTERY BIOPSY Right 06/20/2013   Procedure: BIOPSY TEMPORAL ARTERY RIGHT;  Surgeon: Earnstine Regal, MD;  Location: [facility];  Service: General;  Laterality: Right;  . CARDIAC CATHETERIZATION N/A 09/02/2015   Procedure: Left Heart Cath and Coronary Angiography;  Surgeon: Charolette Forward, MD;  Location: [place] [facility];  Service: Cardiovascular;  Laterality: N/A;  . CARDIAC CATHETERIZATION N/A 09/02/2015   Procedure: Coronary Stent Intervention;  Surgeon: Charolette Forward, MD;  Location: [place] [facility];  Service: Cardiovascular;  Laterality: N/A;  1.   mid RCA      (3.0/28mm Xience) 2.  Mid LAD      (3.0/23mm Xience)  . NO PAST SURGERIES    . None      Family Psychiatric History:   Family History:  Family History  Problem Relation Age of Onset  . Stroke Father        Died, 80s  . Stroke Sister        Living, 10  . Heart disease Mother        Died, 24  . Healthy Daughter   . Diabetes Maternal Grandmother   . Hypertension Neg Hx     Social History:  Social History   Socioeconomic History  . Marital status: Married    Spouse name: Not on file  . Number of children: Not on file  . Years of education: Not on file  . Highest education level: Not on file  Occupational History  . Not on file  Social Needs  . Financial resource strain: Not on file  . Food insecurity:    Worry: Not on file    Inability: Not on file  . Transportation needs:    Medical: Not on file    Non-medical: Not on file  Tobacco Use  . Smoking status: Former Smoker    Types: Cigarettes  Last attempt to quit: 08/24/1966    Years since quitting: 52.1  . Smokeless tobacco: Never Used  Substance and Sexual Activity  . Alcohol use: Yes    Alcohol/week: 2.0 standard drinks    Types: 2 Shots of liquor per week    Comment: socially  . Drug use: No  . Sexual activity: Not on file  Lifestyle  . Physical activity:    Days per week: Not on file    Minutes per session: Not on file  . Stress: Not on file  Relationships  . Social connections:    Talks on phone: Not on file    Gets together: Not on file    Attends religious service: Not on file    Active member of club or organization: Not on file    Attends meetings of clubs or organizations: Not on file    Relationship status: Not on file  Other Topics Concern  . Not on file  Social History Narrative   Currently works as a Midwife.  Lives with wife and they have two healthy daughters.    Allergies:  Allergies  Allergen Reactions  . Lorazepam     Metabolic Disorder Labs: Lab Results   Component Value Date   HGBA1C 5.9 11/01/2017   No results found for: PROLACTIN Lab Results  Component Value Date   CHOL 139 07/28/2018   TRIG 122.0 07/28/2018   HDL 62.40 07/28/2018   CHOLHDL 2 07/28/2018   VLDL 24.4 07/28/2018   LDLCALC 52 07/28/2018   LDLCALC 42 11/01/2017     Current Medications: Current Outpatient Medications  Medication Sig Dispense Refill  . acetaminophen (TYLENOL) 500 MG tablet Take 500 mg by mouth daily as needed (pain).    [name] ALPRAZolam (XANAX) 0.25 MG tablet 2  qhs 60 tablet 4  . aspirin 81 MG chewable tablet Chew 1 tablet (81 mg total) by mouth daily. 30 tablet 3  . atorvastatin (LIPITOR) 40 MG tablet Take 1 tablet (40 mg total) by mouth daily at 6 PM. 30 tablet 3  . cefdinir (OMNICEF) 300 MG capsule Take 1 capsule (300 mg total) by mouth 2 (two) times daily. 20 capsule 0  . clobetasol cream (TEMOVATE) 0.05 % APPLY AS DIRECTED. 30 g 0  . hydrOXYzine (ATARAX/VISTARIL) 10 MG tablet TAKE 1 TABLET ONCE DAILY FOR ITCHING 30 tablet 0  . imipramine (TOFRANIL) 10 MG tablet 2 q hs 60 tablet 5  . levothyroxine (SYNTHROID, LEVOTHROID) 88 MCG tablet Take 1 tablet (88 mcg total) by mouth daily. 30 tablet 0  . metoprolol succinate (TOPROL-XL) 25 MG 24 hr tablet TAKE (1/2) TABLET DAILY. 15 tablet 0  . mupirocin ointment (BACTROBAN) 2 % Apply 1 application topically 2 (two) times daily. (Patient taking differently: Apply 1 application topically 2 (two) times daily. ) 22 g 0  . nitroGLYCERIN (NITROSTAT) 0.4 MG SL tablet Place 1 tablet (0.4 mg total) under the tongue every 5 (five) minutes as needed for chest pain (CP or SOB). 25 tablet 12  . ramipril (ALTACE) 1.25 MG capsule Take 1 capsule (1.25 mg total) by mouth daily. 30 capsule 3  . tamsulosin (FLOMAX) 0.4 MG CAPS capsule TAKE (1) CAPSULE DAILY. (Patient taking differently: TAKE (2) CAPSULES  DAILY AFTER SUPPER) 30 capsule 3  . testosterone cypionate (DEPOTESTOSTERONE CYPIONATE) 200 MG/ML injection Inject 1 mL (200 mg  total) into the muscle every 14 (fourteen) days. 6 mL 0  . venlafaxine XR (EFFEXOR-XR) 37.5 MG 24 hr capsule Take 1 capsule (37.5  mg total) by mouth daily. 30 capsule 6  . disulfiram (ANTABUSE) 250 MG tablet Take 1 tablet (250 mg total) by mouth daily. (Patient not taking: Reported on 10/12/2018) 30 tablet 0  . HYDROcodone-homatropine (HYCODAN) 5-1.5 MG/5ML syrup Take 5 mLs by mouth every 8 (eight) hours as needed for cough. (Patient not taking: Reported on 10/12/2018) 120 mL 0  . meclizine (ANTIVERT) 25 MG tablet Take 1 tablet (25 mg total) by mouth 2 (two) times daily as needed for dizziness. (Patient not taking: Reported on 10/12/2018) 10 tablet 0  . temazepam (RESTORIL) 15 MG capsule Take 1 capsule (15 mg total) by mouth at bedtime as needed for sleep. 30 capsule 4   Current Facility-Administered Medications  Medication Dose Route Frequency Provider Last Rate Last Dose  . testosterone cypionate (DEPOTESTOSTERONE CYPIONATE) injection 200 mg  200 mg Intramuscular Q14 Days Elayne Snare, MD   200 mg at 04/28/18 1529  . testosterone cypionate (DEPOTESTOSTERONE CYPIONATE) injection 200 mg  200 mg Intramuscular Q14 Days Janith Lima, MD   200 mg at 09/28/18 0840    Neurologic: Headache: No Seizure: No Paresthesias: No  Musculoskeletal: Strength & Muscle Tone: within normal limits Gait & Station: normal Patient leans: NA  Psychiatric Specialty Exam: ROS  Blood pressure 121/62, pulse 72, height 5' 7.5" (1.715 m), weight 185 lb (83.9 kg), SpO2 100 %.Body mass index is 28.55 kg/m.  General Appearance: Fairly Groomed  Eye Contact:  Good  Speech:  Clear and Coherent  Volume:  Normal  Mood:  Euthymic  Affect:  Appropriate  Thought Process:  Coherent  Orientation:  Full (Time, Place, and Person)  Thought Content:  WDL  Suicidal Thoughts:  No  Homicidal Thoughts:  No  Memory:  NA  Judgement:  Good  Insight:  Good  Psychomotor Activity:  Normal  Concentration:  Good  Recall:  Good  Fund  of Knowledge: Good  Language: Good  Akathisia:  No  Handed:  Right  AIMS (if indicated):    Assets:  Desire for Improvement  ADL's:  Intact  Cognition: WNL  Sleep:       Treatment Plan at this time the patient is doing well.  His #1 problem seems to be clinical depression.  Continue taking Effexor 75 mg and Wellbutrin for this condition.  His second problem is alcohol abuse.  Today's he is made a commitment to reduce his alcohol to 1 or 2 drinks.  His third problem is that of insomnia.  At this time we will go ahead and start him on Restoril 15 mg at night for sleep.  Patient continue taking imipramine and Xanax both at night.  The patient presently is getting good medical care.  He denies chest pain shortness of breath or any neurological symptoms.  He will return to see me in 5 months.  Jerral Ralph, MD 10/12/2018, 4:28 PM [facility] MD Progress Note  10/12/2018 4:28 PM [name]  MRN:  [id number] Subjective:  Feels well Principal Problem: Major depression, chronic, mild/residual Diagnosis:  Major depression, recurrent residual Today the patient is doing fairly well. He's been having some medical problems. He recently went for Thanksgiving to a length but unfortunately had a urinary tract infection. He also since then has been coughing and recently started on antibiotic. Emotionally he is fairly stable. He denies daily depression. He stays very active at work. He's been unable to exercise. The patient is sleeping well and has an increase in his appetite. His energy is good.  Is no problems thinking or concentrating. He is recently having a productive cough but is no shortness of breath. He denies any chest pain. Is a stable relationship with his wife. His kidneys are good. He has 4 grandchildren. Everybody in his home is good. He only issue is that a number mornings he awakens and he feels oversedated. It occurs point of his wife feels uncomfortable with him driving will taken to work.  Today reviewed his medications and is evident that he likely needs reduction. He also notes that his memory has change in his and is good. 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 Patient Active Problem List   Diagnosis Date Noted  . Mild intermittent asthma with acute exacerbation [J45.21] 02/08/2018  . Hypothyroidism [E03.9] 03/14/2013  . Hyperlipidemia LDL goal <70 [E78.5] 03/14/2013  . BPH (benign prostatic hypertrophy) [N40.0] 03/14/2013  . Hypogonadism, male [E29.1] 03/14/2013  . Erectile dysfunction [N52.9] 03/14/2013   Total Time spent with patient: 30 minutes  Past Psychiatric History:   Past Medical History:  Past Medical History:  Diagnosis Date  . Anxiety   . Arthritis    might be in back, no problems  . BPH (benign prostatic hyperplasia)   . Depression 1990s  . GERD (gastroesophageal reflux disease)    occasional  . Hypercholesteremia    controled  . Hypothyroid   . Shingles May 2014   "Right face, still has some"  . Temporal arteritis ([place])   . Transient blindness of both eyes     Past Surgical History:  Procedure Laterality Date  . ARTERY BIOPSY Right 06/20/2013   Procedure: BIOPSY TEMPORAL ARTERY RIGHT;  Surgeon: Earnstine Regal, MD;  Location: [facility];  Service: General;  Laterality: Right;  . CARDIAC CATHETERIZATION N/A 09/02/2015   Procedure: Left Heart Cath and Coronary Angiography;  Surgeon: Charolette Forward, MD;  Location: [place] [facility];  Service: Cardiovascular;  Laterality: N/A;  . CARDIAC CATHETERIZATION N/A 09/02/2015   Procedure: Coronary Stent Intervention;  Surgeon: Charolette Forward, MD;  Location: [place] [facility];  Service: Cardiovascular;  Laterality: N/A;  1.  mid RCA      (3.0/28mm Xience) 2.  Mid LAD      (3.0/23mm Xience)  . NO PAST SURGERIES    . None     Family History:  Family History  Problem Relation Age of Onset  . Stroke Father        Died, 80s  . Stroke Sister        Living, 22   . Heart disease Mother        Died, 57  . Healthy Daughter   . Diabetes Maternal Grandmother   . Hypertension Neg Hx    Family Psychiatric  History:  Social History:  Social History   Substance and Sexual Activity  Alcohol Use Yes  . Alcohol/week: 2.0 standard drinks  . Types: 2 Shots of liquor per week   Comment: socially     Social History   Substance and Sexual Activity  Drug Use No    Social History   Socioeconomic History  . Marital status: Married    Spouse name: Not on file  . Number of children: Not on file  . Years of education: Not on file  . Highest education level: Not on file  Occupational History  . Not on file  Social Needs  . Financial resource strain: Not on file  . Food insecurity:  Worry: Not on file    Inability: Not on file  . Transportation needs:    Medical: Not on file    Non-medical: Not on file  Tobacco Use  . Smoking status: Former Smoker    Types: Cigarettes    Last attempt to quit: 08/24/1966    Years since quitting: 52.1  . Smokeless tobacco: Never Used  Substance and Sexual Activity  . Alcohol use: Yes    Alcohol/week: 2.0 standard drinks    Types: 2 Shots of liquor per week    Comment: socially  . Drug use: No  . Sexual activity: Not on file  Lifestyle  . Physical activity:    Days per week: Not on file    Minutes per session: Not on file  . Stress: Not on file  Relationships  . Social connections:    Talks on phone: Not on file    Gets together: Not on file    Attends religious service: Not on file    Active member of club or organization: Not on file    Attends meetings of clubs or organizations: Not on file    Relationship status: Not on file  Other Topics Concern  . Not on file  Social History Narrative   Currently works as a Midwife.  Lives with wife and they have two healthy daughters.   Additional Social History:                         Sleep: Good  Appetite:  Good  Current  Medications: Current Outpatient Medications  Medication Sig Dispense Refill  . acetaminophen (TYLENOL) 500 MG tablet Take 500 mg by mouth daily as needed (pain).    [name] ALPRAZolam (XANAX) 0.25 MG tablet 2  qhs 60 tablet 4  . aspirin 81 MG chewable tablet Chew 1 tablet (81 mg total) by mouth daily. 30 tablet 3  . atorvastatin (LIPITOR) 40 MG tablet Take 1 tablet (40 mg total) by mouth daily at 6 PM. 30 tablet 3  . cefdinir (OMNICEF) 300 MG capsule Take 1 capsule (300 mg total) by mouth 2 (two) times daily. 20 capsule 0  . clobetasol cream (TEMOVATE) 0.05 % APPLY AS DIRECTED. 30 g 0  . hydrOXYzine (ATARAX/VISTARIL) 10 MG tablet TAKE 1 TABLET ONCE DAILY FOR ITCHING 30 tablet 0  . imipramine (TOFRANIL) 10 MG tablet 2 q hs 60 tablet 5  . levothyroxine (SYNTHROID, LEVOTHROID) 88 MCG tablet Take 1 tablet (88 mcg total) by mouth daily. 30 tablet 0  . metoprolol succinate (TOPROL-XL) 25 MG 24 hr tablet TAKE (1/2) TABLET DAILY. 15 tablet 0  . mupirocin ointment (BACTROBAN) 2 % Apply 1 application topically 2 (two) times daily. (Patient taking differently: Apply 1 application topically 2 (two) times daily. ) 22 g 0  . nitroGLYCERIN (NITROSTAT) 0.4 MG SL tablet Place 1 tablet (0.4 mg total) under the tongue every 5 (five) minutes as needed for chest pain (CP or SOB). 25 tablet 12  . ramipril (ALTACE) 1.25 MG capsule Take 1 capsule (1.25 mg total) by mouth daily. 30 capsule 3  . tamsulosin (FLOMAX) 0.4 MG CAPS capsule TAKE (1) CAPSULE DAILY. (Patient taking differently: TAKE (2) CAPSULES  DAILY AFTER SUPPER) 30 capsule 3  . testosterone cypionate (DEPOTESTOSTERONE CYPIONATE) 200 MG/ML injection Inject 1 mL (200 mg total) into the muscle every 14 (fourteen) days. 6 mL 0  . venlafaxine XR (EFFEXOR-XR) 37.5 MG 24 hr capsule Take 1 capsule (37.5 mg  total) by mouth daily. 30 capsule 6  . disulfiram (ANTABUSE) 250 MG tablet Take 1 tablet (250 mg total) by mouth daily. (Patient not taking: Reported on 10/12/2018) 30  tablet 0  . HYDROcodone-homatropine (HYCODAN) 5-1.5 MG/5ML syrup Take 5 mLs by mouth every 8 (eight) hours as needed for cough. (Patient not taking: Reported on 10/12/2018) 120 mL 0  . meclizine (ANTIVERT) 25 MG tablet Take 1 tablet (25 mg total) by mouth 2 (two) times daily as needed for dizziness. (Patient not taking: Reported on 10/12/2018) 10 tablet 0  . temazepam (RESTORIL) 15 MG capsule Take 1 capsule (15 mg total) by mouth at bedtime as needed for sleep. 30 capsule 4   Current Facility-Administered Medications  Medication Dose Route Frequency Provider Last Rate Last Dose  . testosterone cypionate (DEPOTESTOSTERONE CYPIONATE) injection 200 mg  200 mg Intramuscular Q14 Days Elayne Snare, MD   200 mg at 04/28/18 1529  . testosterone cypionate (DEPOTESTOSTERONE CYPIONATE) injection 200 mg  200 mg Intramuscular Q14 Days Janith Lima, MD   200 mg at 09/28/18 0840    Lab Results:  No results found for this or any previous visit (from the past 52 hour(s)).  Physical Findings: AIMS:  , ,  ,  ,    CIWA:    COWS:     Musculoskeletal: Strength & Muscle Tone: within normal limits Gait & Station: normal Patient leans: Right  Psychiatric Specialty Exam: ROS  Blood pressure 121/62, pulse 72, height 5' 7.5" (1.715 m), weight 185 lb (83.9 kg), SpO2 100 %.Body mass index is 28.55 kg/m.  General Appearance: Negative  Eye Contact::  Good  Speech:  Clear and Coherent  Volume:  Normal  Mood:  NA  Affect:  Appropriate  Thought Process:  Coherent  Orientation:  Full (Time, Place, and Person)  Thought Content:  WDL  Suicidal Thoughts:  No  Homicidal Thoughts:  No  Memory:  NA  Judgement:  Good  Insight:  Good  Psychomotor Activity:  Normal  Concentration:  Good  Recall:  Good  Fund of Knowledge:Good  Language: Good  Akathisia:  No  Handed:  Right  AIMS (if indicated):     Assets:  Desire for Improvement  ADL's:  Intact  Cognition: WNL  Sleep:      Treatment Plan Summary:At this  time the patient will continue taking imipramine 10 mg at night and Xanax 0.5 mg. One of his problems with sleep or these medications she does well. Is not on it major problem is that of clinical depression. Again he has problems with compliance. She's taking [place] 150 mg XL all the complaints about extensive. He does state. For reasons that are not clear he stopped his Effexor. Today he is agreed to restart his Effexor 75 mg XL each morning. Taking Effexor with [place] morning and he takes Xanax and imipramine night. He'll return to see me in 3 months for an opportunity to have some supportive psychotherapy. Presently the patient is not in therapy. I do not think this patient is suicidal at this time. Physically he is very stable. [facility] MD Progress Note  10/12/2018 4:28 PM KRYSTOPHER KUENZEL  MRN:  [id number] Subjective:  Feels well Principal Problem: Major depression, chronic, mild/residual Diagnosis:  Major depression, recurrent residual Today the patient is doing fairly well. He's been having some medical problems. He recently went for Thanksgiving to a length but unfortunately had a urinary tract infection. He also since then has been coughing and recently started on  antibiotic. Emotionally he is fairly stable. He denies daily depression. He stays very active at work. He's been unable to exercise. The patient is sleeping well and has an increase in his appetite. His energy is good. Is no problems thinking or concentrating. He is recently having a productive cough but is no shortness of breath. He denies any chest pain. Is a stable relationship with his wife. His kidneys are good. He has 4 grandchildren. Everybody in his home is good. He only issue is that a number mornings he awakens and he feels oversedated. It occurs point of his wife feels uncomfortable with him driving will taken to work. Today reviewed his medications and is evident that he likely needs reduction. He also notes that his memory  has change in his and is good. 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 Patient Active Problem List   Diagnosis Date Noted  . Mild intermittent asthma with acute exacerbation [J45.21] 02/08/2018  . Hypothyroidism [E03.9] 03/14/2013  . Hyperlipidemia LDL goal <70 [E78.5] 03/14/2013  . BPH (benign prostatic hypertrophy) [N40.0] 03/14/2013  . Hypogonadism, male [E29.1] 03/14/2013  . Erectile dysfunction [N52.9] 03/14/2013   Total Time spent with patient: 30 minutes  Past Psychiatric History:   Past Medical History:  Past Medical History:  Diagnosis Date  . Anxiety   . Arthritis    might be in back, no problems  . BPH (benign prostatic hyperplasia)   . Depression 1990s  . GERD (gastroesophageal reflux disease)    occasional  . Hypercholesteremia    controled  . Hypothyroid   . Shingles May 2014   "Right face, still has some"  . Temporal arteritis ([place])   . Transient blindness of both eyes     Past Surgical History:  Procedure Laterality Date  . ARTERY BIOPSY Right 06/20/2013   Procedure: BIOPSY TEMPORAL ARTERY RIGHT;  Surgeon: Earnstine Regal, MD;  Location: [facility];  Service: General;  Laterality: Right;  . CARDIAC CATHETERIZATION N/A 09/02/2015   Procedure: Left Heart Cath and Coronary Angiography;  Surgeon: Charolette Forward, MD;  Location: [place] [facility];  Service: Cardiovascular;  Laterality: N/A;  . CARDIAC CATHETERIZATION N/A 09/02/2015   Procedure: Coronary Stent Intervention;  Surgeon: Charolette Forward, MD;  Location: [place] [facility];  Service: Cardiovascular;  Laterality: N/A;  1.  mid RCA      (3.0/28mm Xience) 2.  Mid LAD      (3.0/23mm Xience)  . NO PAST SURGERIES    . None     Family History:  Family History  Problem Relation Age of Onset  . Stroke Father        Died, 80s  . Stroke Sister        Living, 18  . Heart disease Mother        Died, 29  . Healthy Daughter   . Diabetes Maternal Grandmother   .  Hypertension Neg Hx    Family Psychiatric  History:  Social History:  Social History   Substance and Sexual Activity  Alcohol Use Yes  . Alcohol/week: 2.0 standard drinks  . Types: 2 Shots of liquor per week   Comment: socially     Social History   Substance and Sexual Activity  Drug Use No    Social History   Socioeconomic History  . Marital status: Married    Spouse name: Not on file  . Number of children: Not on file  .  Years of education: Not on file  . Highest education level: Not on file  Occupational History  . Not on file  Social Needs  . Financial resource strain: Not on file  . Food insecurity:    Worry: Not on file    Inability: Not on file  . Transportation needs:    Medical: Not on file    Non-medical: Not on file  Tobacco Use  . Smoking status: Former Smoker    Types: Cigarettes    Last attempt to quit: 08/24/1966    Years since quitting: 52.1  . Smokeless tobacco: Never Used  Substance and Sexual Activity  . Alcohol use: Yes    Alcohol/week: 2.0 standard drinks    Types: 2 Shots of liquor per week    Comment: socially  . Drug use: No  . Sexual activity: Not on file  Lifestyle  . Physical activity:    Days per week: Not on file    Minutes per session: Not on file  . Stress: Not on file  Relationships  . Social connections:    Talks on phone: Not on file    Gets together: Not on file    Attends religious service: Not on file    Active member of club or organization: Not on file    Attends meetings of clubs or organizations: Not on file    Relationship status: Not on file  Other Topics Concern  . Not on file  Social History Narrative   Currently works as a Midwife.  Lives with wife and they have two healthy daughters.   Additional Social History:                         Sleep: Good  Appetite:  Good  Current Medications: Current Outpatient Medications  Medication Sig Dispense Refill  . acetaminophen (TYLENOL) 500 MG  tablet Take 500 mg by mouth daily as needed (pain).    [name] ALPRAZolam (XANAX) 0.25 MG tablet 2  qhs 60 tablet 4  . aspirin 81 MG chewable tablet Chew 1 tablet (81 mg total) by mouth daily. 30 tablet 3  . atorvastatin (LIPITOR) 40 MG tablet Take 1 tablet (40 mg total) by mouth daily at 6 PM. 30 tablet 3  . cefdinir (OMNICEF) 300 MG capsule Take 1 capsule (300 mg total) by mouth 2 (two) times daily. 20 capsule 0  . clobetasol cream (TEMOVATE) 0.05 % APPLY AS DIRECTED. 30 g 0  . hydrOXYzine (ATARAX/VISTARIL) 10 MG tablet TAKE 1 TABLET ONCE DAILY FOR ITCHING 30 tablet 0  . imipramine (TOFRANIL) 10 MG tablet 2 q hs 60 tablet 5  . levothyroxine (SYNTHROID, LEVOTHROID) 88 MCG tablet Take 1 tablet (88 mcg total) by mouth daily. 30 tablet 0  . metoprolol succinate (TOPROL-XL) 25 MG 24 hr tablet TAKE (1/2) TABLET DAILY. 15 tablet 0  . mupirocin ointment (BACTROBAN) 2 % Apply 1 application topically 2 (two) times daily. (Patient taking differently: Apply 1 application topically 2 (two) times daily. ) 22 g 0  . nitroGLYCERIN (NITROSTAT) 0.4 MG SL tablet Place 1 tablet (0.4 mg total) under the tongue every 5 (five) minutes as needed for chest pain (CP or SOB). 25 tablet 12  . ramipril (ALTACE) 1.25 MG capsule Take 1 capsule (1.25 mg total) by mouth daily. 30 capsule 3  . tamsulosin (FLOMAX) 0.4 MG CAPS capsule TAKE (1) CAPSULE DAILY. (Patient taking differently: TAKE (2) CAPSULES  DAILY AFTER SUPPER) 30 capsule 3  .  testosterone cypionate (DEPOTESTOSTERONE CYPIONATE) 200 MG/ML injection Inject 1 mL (200 mg total) into the muscle every 14 (fourteen) days. 6 mL 0  . venlafaxine XR (EFFEXOR-XR) 37.5 MG 24 hr capsule Take 1 capsule (37.5 mg total) by mouth daily. 30 capsule 6  . disulfiram (ANTABUSE) 250 MG tablet Take 1 tablet (250 mg total) by mouth daily. (Patient not taking: Reported on 10/12/2018) 30 tablet 0  . HYDROcodone-homatropine (HYCODAN) 5-1.5 MG/5ML syrup Take 5 mLs by mouth every 8 (eight) hours as  needed for cough. (Patient not taking: Reported on 10/12/2018) 120 mL 0  . meclizine (ANTIVERT) 25 MG tablet Take 1 tablet (25 mg total) by mouth 2 (two) times daily as needed for dizziness. (Patient not taking: Reported on 10/12/2018) 10 tablet 0  . temazepam (RESTORIL) 15 MG capsule Take 1 capsule (15 mg total) by mouth at bedtime as needed for sleep. 30 capsule 4   Current Facility-Administered Medications  Medication Dose Route Frequency Provider Last Rate Last Dose  . testosterone cypionate (DEPOTESTOSTERONE CYPIONATE) injection 200 mg  200 mg Intramuscular Q14 Days Elayne Snare, MD   200 mg at 04/28/18 1529  . testosterone cypionate (DEPOTESTOSTERONE CYPIONATE) injection 200 mg  200 mg Intramuscular Q14 Days Janith Lima, MD   200 mg at 09/28/18 0840    Lab Results:  No results found for this or any previous visit (from the past 64 hour(s)).  Physical Findings: AIMS:  , ,  ,  ,    CIWA:    COWS:     Musculoskeletal: Strength & Muscle Tone: within normal limits Gait & Station: normal Patient leans: Right  Psychiatric Specialty Exam: ROS  Blood pressure 121/62, pulse 72, height 5' 7.5" (1.715 m), weight 185 lb (83.9 kg), SpO2 100 %.Body mass index is 28.55 kg/m.  General Appearance: Negative  Eye Contact::  Good  Speech:  Clear and Coherent  Volume:  Normal  Mood:  NA  Affect:  Appropriate  Thought Process:  Coherent  Orientation:  Full (Time, Place, and Person)  Thought Content:  WDL  Suicidal Thoughts:  No  Homicidal Thoughts:  No  Memory:  NA  Judgement:  Good  Insight:  Good  Psychomotor Activity:  Normal  Concentration:  Good  Recall:  Good  Fund of Knowledge:Good  Language: Good  Akathisia:  No  Handed:  Right  AIMS (if indicated):     Assets:  Desire for Improvement  ADL's:  Intact  Cognition: WNL  Sleep:      Treatment Plan Summary: This patient has 2 stable problems.  His first problem is that of clinical depression.  He takes Effexor and does very  well.  He also takes a small dose of imipramine at night.  His second problem is that of insomnia.  He takes melatonin and at times he takes Restoril 15 mg.  Today he is assess to slightly increase his Xanax.  We will go ahead and increase it to a dose of 0.25 mg taking 2 at night.  Patient is no longer drinking.  He is functioning very well.  He will return to see me in 3 months.

## 2018-10-17 ENCOUNTER — Other Ambulatory Visit: Payer: Self-pay | Admitting: Endocrinology

## 2018-10-20 DIAGNOSIS — J45909 Unspecified asthma, uncomplicated: Secondary | ICD-10-CM | POA: Diagnosis not present

## 2018-10-20 DIAGNOSIS — I10 Essential (primary) hypertension: Secondary | ICD-10-CM | POA: Diagnosis not present

## 2018-10-20 DIAGNOSIS — I25118 Atherosclerotic heart disease of native coronary artery with other forms of angina pectoris: Secondary | ICD-10-CM | POA: Diagnosis not present

## 2018-10-20 DIAGNOSIS — N189 Chronic kidney disease, unspecified: Secondary | ICD-10-CM | POA: Diagnosis not present

## 2018-10-20 DIAGNOSIS — E785 Hyperlipidemia, unspecified: Secondary | ICD-10-CM | POA: Diagnosis not present

## 2018-10-20 DIAGNOSIS — M1990 Unspecified osteoarthritis, unspecified site: Secondary | ICD-10-CM | POA: Diagnosis not present

## 2018-10-24 ENCOUNTER — Ambulatory Visit (INDEPENDENT_AMBULATORY_CARE_PROVIDER_SITE_OTHER): Payer: Medicare Other | Admitting: Family

## 2018-10-24 ENCOUNTER — Ambulatory Visit (INDEPENDENT_AMBULATORY_CARE_PROVIDER_SITE_OTHER)
Admission: RE | Admit: 2018-10-24 | Discharge: 2018-10-24 | Disposition: A | Discharge status: Home / Self Care | Payer: Medicare Other | Source: Ambulatory Visit | Attending: Family | Admitting: Family

## 2018-10-24 ENCOUNTER — Encounter: Payer: Self-pay | Admitting: Family

## 2018-10-24 VITALS — BP 108/62 | HR 65 | Temp 97.9°F | Ht 67.5 in

## 2018-10-24 DIAGNOSIS — R05 Cough: Secondary | ICD-10-CM

## 2018-10-24 DIAGNOSIS — R0781 Pleurodynia: Secondary | ICD-10-CM | POA: Diagnosis not present

## 2018-10-24 DIAGNOSIS — R058 Other specified cough: Secondary | ICD-10-CM

## 2018-10-24 DIAGNOSIS — J209 Acute bronchitis, unspecified: Secondary | ICD-10-CM

## 2018-10-24 MED ORDER — AZITHROMYCIN 250 MG PO TABS: ORAL_TABLET | ORAL | 0 refills | Status: DC

## 2018-10-24 MED ORDER — HYDROCODONE-HOMATROPINE 5-1.5 MG/5ML PO SYRP: 5.0000 mL | ORAL_SOLUTION | Freq: Three times a day (TID) | ORAL | 0 refills | Status: DC | PRN

## 2018-10-24 NOTE — Progress Notes (Signed)
Benjamin Hahn is a 85 y.o. male with the following history as recorded in [employer]:  Patient Active Problem List   Diagnosis Date Noted  . Mild intermittent asthma with acute exacerbation 02/08/2018  . Hypothyroidism 03/14/2013  . Hyperlipidemia LDL goal <70 03/14/2013  . BPH (benign prostatic hypertrophy) 03/14/2013  . Hypogonadism, male 03/14/2013  . Erectile dysfunction 03/14/2013    Current Outpatient Medications  Medication Sig Dispense Refill  . acetaminophen (TYLENOL) 500 MG tablet Take 500 mg by mouth daily as needed (pain).    [name] ALPRAZolam (XANAX) 0.25 MG tablet 2  qhs 60 tablet 4  . aspirin 81 MG chewable tablet Chew 1 tablet (81 mg total) by mouth daily. 30 tablet 3  . atorvastatin (LIPITOR) 40 MG tablet Take 1 tablet (40 mg total) by mouth daily at 6 PM. 30 tablet 3  . buPROPion (WELLBUTRIN XL) 150 MG 24 hr tablet     . clobetasol cream (TEMOVATE) 0.05 % APPLY AS DIRECTED. 30 g 0  . hydrOXYzine (ATARAX/VISTARIL) 10 MG tablet TAKE 1 TABLET ONCE DAILY FOR ITCHING 30 tablet 0  . imipramine (TOFRANIL) 10 MG tablet 2 q hs 60 tablet 5  . levothyroxine (SYNTHROID, LEVOTHROID) 88 MCG tablet TAKE 1 TABLET BY MOUTH DAILY. 30 tablet 0  . metoprolol succinate (TOPROL-XL) 25 MG 24 hr tablet TAKE (1/2) TABLET DAILY. 15 tablet 0  . mupirocin ointment (BACTROBAN) 2 % Apply 1 application topically 2 (two) times daily. (Patient taking differently: Apply 1 application topically 2 (two) times daily. ) 22 g 0  . nitroGLYCERIN (NITROSTAT) 0.4 MG SL tablet Place 1 tablet (0.4 mg total) under the tongue every 5 (five) minutes as needed for chest pain (CP or SOB). 25 tablet 12  . ramipril (ALTACE) 1.25 MG capsule Take 1 capsule (1.25 mg total) by mouth daily. 30 capsule 3  . tamsulosin (FLOMAX) 0.4 MG CAPS capsule TAKE (1) CAPSULE DAILY. (Patient taking differently: TAKE (2) CAPSULES  DAILY AFTER SUPPER) 30 capsule 3  . temazepam (RESTORIL) 15 MG capsule Take 1 capsule (15 mg total) by mouth at  bedtime as needed for sleep. 30 capsule 4  . venlafaxine XR (EFFEXOR-XR) 37.5 MG 24 hr capsule Take 1 capsule (37.5 mg total) by mouth daily. 30 capsule 6  . azithromycin (ZITHROMAX) 250 MG tablet 2 tabs po qd x 1 day; 1 tablet per day x 4 days; 6 tablet 0  . HYDROcodone-homatropine (HYCODAN) 5-1.5 MG/5ML syrup Take 5 mLs by mouth every 8 (eight) hours as needed for cough. 120 mL 0   Current Facility-Administered Medications  Medication Dose Route Frequency Provider Last Rate Last Dose  . testosterone cypionate (DEPOTESTOSTERONE CYPIONATE) injection 200 mg  200 mg Intramuscular Q14 Days Elayne Snare, MD   200 mg at 04/28/18 1529  . testosterone cypionate (DEPOTESTOSTERONE CYPIONATE) injection 200 mg  200 mg Intramuscular Q14 Days Janith Lima, MD   200 mg at 09/28/18 0840    Allergies: Lorazepam  Past Medical History:  Diagnosis Date  . Anxiety   . Arthritis    might be in back, no problems  . BPH (benign prostatic hyperplasia)   . Depression 1990s  . GERD (gastroesophageal reflux disease)    occasional  . Hypercholesteremia    controled  . Hypothyroid   . Shingles May 2014   "Right face, still has some"  . Temporal arteritis ([place])   . Transient blindness of both eyes     Past Surgical History:  Procedure Laterality Date  .  ARTERY BIOPSY Right 06/20/2013   Procedure: BIOPSY TEMPORAL ARTERY RIGHT;  Surgeon: Earnstine Regal, MD;  Location: [facility];  Service: General;  Laterality: Right;  . CARDIAC CATHETERIZATION N/A 09/02/2015   Procedure: Left Heart Cath and Coronary Angiography;  Surgeon: Charolette Forward, MD;  Location: [facility];  Service: Cardiovascular;  Laterality: N/A;  . CARDIAC CATHETERIZATION N/A 09/02/2015   Procedure: Coronary Stent Intervention;  Surgeon: Charolette Forward, MD;  Location: [facility];  Service: Cardiovascular;  Laterality: N/A;  1.  mid RCA      (3.0/28mm Xience) 2.  Mid LAD      (3.0/23mm Xience)  . NO PAST SURGERIES    . None      Family  History  Problem Relation Age of Onset  . Stroke Father        Died, 80s  . Stroke Sister        Living, 37  . Heart disease Mother        Died, 14  . Healthy Daughter   . Diabetes Maternal Grandmother   . Hypertension Neg Hx     Social History   Tobacco Use  . Smoking status: Former Smoker    Types: Cigarettes    Last attempt to quit: 08/24/1966    Years since quitting: 52.2  . Smokeless tobacco: Never Used  Substance Use Topics  . Alcohol use: Yes    Alcohol/week: 2.0 standard drinks    Types: 2 Shots of liquor per week    Comment: socially    Subjective:  Patient presents with concerns for 1 week history of cough/ congestion; is prone to recurrent infections; requesting treatment with Zithromax and Hydrocodone cough syrup; former smoker; Did feel that last infection cleared completely; notes that he fell last [place] and landed on his left rib; able to get up but notes that his chest does hurt with deep cough or laughing;    Objective:  Vitals:   10/24/18 1135  BP: 108/62  Pulse: 65  Temp: 97.9 F (36.6 C)  TempSrc: Oral  SpO2: 96%  Height: 5' 7.5" (1.715 m)    General: Well developed, well nourished, in no acute distress  Skin : Warm and dry.  Head: Normocephalic and atraumatic  Eyes: Sclera and conjunctiva clear; pupils round and reactive to light; extraocular movements intact  Ears: External normal; canals clear; tympanic membranes normal  Oropharynx: Pink, supple. No suspicious lesions  Neck: Supple without thyromegaly, adenopathy  Lungs: Respirations unlabored; clear to auscultation bilaterally without wheeze, rales, rhonchi  CVS exam: normal rate and regular rhythm.  Neurologic: Alert and oriented; speech intact; face symmetrical; moves all extremities well; CNII-XII intact without focal deficit   Assessment:  1. Recurrent cough   2. Acute bronchitis, unspecified organism   3. Rib pain on left side     Plan:  Update  CXR today due to frequency of symptoms; Rx for Z-pak and Hycodan cough syrup per patient request; he understands not to use his Restoril while on the Hycodan; follow-up to be determined.   No follow-ups on file.  Orders Placed This Encounter  Procedures  . DG Chest 2 View    Standing Status:   Future    Number of Occurrences:   1    Standing Expiration Date:   12/24/2019    Order Specific Question:   Reason for Exam (SYMPTOM  OR DIAGNOSIS REQUIRED)    Answer:   recurrent cough  Order Specific Question:   Preferred imaging location?    Answer:   [facility]    Order Specific Question:   Radiology Contrast Protocol - do NOT remove file path    Answer:   [email or web address]    Requested Prescriptions   Signed Prescriptions Disp Refills  . azithromycin (ZITHROMAX) 250 MG tablet 6 tablet 0    Sig: 2 tabs po qd x 1 day; 1 tablet per day x 4 days;  . HYDROcodone-homatropine (HYCODAN) 5-1.5 MG/5ML syrup 120 mL 0    Sig: Take 5 mLs by mouth every 8 (eight) hours as needed for cough.

## 2018-10-26 ENCOUNTER — Ambulatory Visit: Payer: Self-pay

## 2018-11-14 ENCOUNTER — Other Ambulatory Visit: Payer: Self-pay | Admitting: Endocrinology

## 2018-12-17 ENCOUNTER — Other Ambulatory Visit: Payer: Self-pay | Admitting: Endocrinology

## 2018-12-26 ENCOUNTER — Other Ambulatory Visit (HOSPITAL_COMMUNITY): Payer: Self-pay

## 2018-12-26 ENCOUNTER — Other Ambulatory Visit: Payer: Self-pay | Admitting: Internal Medicine

## 2018-12-26 MED ORDER — BUPROPION HCL ER (XL) 150 MG PO TB24: 150.0000 mg | ORAL_TABLET | Freq: Every day | ORAL | 2 refills | Status: DC

## 2019-01-11 ENCOUNTER — Ambulatory Visit (INDEPENDENT_AMBULATORY_CARE_PROVIDER_SITE_OTHER): Payer: Medicare Other | Admitting: Psychiatry

## 2019-01-11 ENCOUNTER — Other Ambulatory Visit: Payer: Self-pay

## 2019-01-11 DIAGNOSIS — F325 Major depressive disorder, single episode, in full remission: Secondary | ICD-10-CM | POA: Diagnosis not present

## 2019-01-11 NOTE — Progress Notes (Signed)
Patient ID: Benjamin Hahn, male   DOB: 1932/10/24, 85 y.o.   MRN: [id number] [facility] MD/PA/NP OP Progress Note  01/11/2019 4:16 PM WOODRUFF SKIRVIN  MRN:  [id number]  Chief Complaint: Major depression remission Subjective: Doing great  This patient is being treated at the Center for major clinical depression.  At this time he is doing exceptionally well.  He was also apparent that he had an alcohol use disorder and that he was drinking 3-6 drinks nightly.  At this time he has been sober for 6 months.  He does not go to AA but in our discussions he is decided to discontinue his alcohol and is done well.  His mood is great.  He sleeps at night fairly well taking Xanax .5 mg 2 at night together with melatonin.  He rarely takes her Restoril instead.  But for the most part the combination of Xanax and melatonin works.  The patient also takes Wellbutrin in the morning and Effexor and is resolved his depression is well controlled.  He is eating well and stays active.  He goes to work every day.  His wife and him are getting along well.  The viral pandemic is not disturbing him.  They have quarantine themselves fairly well.  Now they are looking forward to getting out back to the community.  The patient is very conservative.  He is hesitant to go to a movie until they have a vaccine for the coronavirus.  The patient has no evidence of psychosis.  He denies cough shortness of breath or fever.  He has been medically very stable.    Past Medical History:  Diagnosis Date  . Anxiety   . Arthritis    might be in back, no problems  . BPH (benign prostatic hyperplasia)   . Depression 1990s  . GERD (gastroesophageal reflux disease)    occasional  . Hypercholesteremia    controled  . Hypothyroid   . Shingles May 2014   "Right face, still has some"  . Temporal arteritis ([place])   . Transient blindness of both eyes     Past Surgical History:  Procedure Laterality Date  . ARTERY BIOPSY Right 06/20/2013    Procedure: BIOPSY TEMPORAL ARTERY RIGHT;  Surgeon: Earnstine Regal, MD;  Location: [facility];  Service: General;  Laterality: Right;  . CARDIAC CATHETERIZATION N/A 09/02/2015   Procedure: Left Heart Cath and Coronary Angiography;  Surgeon: Charolette Forward, MD;  Location: [place] [facility];  Service: Cardiovascular;  Laterality: N/A;  . CARDIAC CATHETERIZATION N/A 09/02/2015   Procedure: Coronary Stent Intervention;  Surgeon: Charolette Forward, MD;  Location: [place] [facility];  Service: Cardiovascular;  Laterality: N/A;  1.  mid RCA      (3.0/28mm Xience) 2.  Mid LAD      (3.0/23mm Xience)  . NO PAST SURGERIES    . None      Family Psychiatric History:   Family History:  Family History  Problem Relation Age of Onset  . Stroke Father        Died, 80s  . Stroke Sister        Living, 21  . Heart disease Mother        Died, 42  . Healthy Daughter   . Diabetes Maternal Grandmother   . Hypertension Neg Hx     Social History:  Social History   Socioeconomic History  . Marital status: Married    Spouse name: Not on file  . Number of  children: Not on file  . Years of education: Not on file  . Highest education level: Not on file  Occupational History  . Not on file  Social Needs  . Financial resource strain: Not on file  . Food insecurity:    Worry: Not on file    Inability: Not on file  . Transportation needs:    Medical: Not on file    Non-medical: Not on file  Tobacco Use  . Smoking status: Former Smoker    Types: Cigarettes    Last attempt to quit: 08/24/1966    Years since quitting: 52.4  . Smokeless tobacco: Never Used  Substance and Sexual Activity  . Alcohol use: Yes    Alcohol/week: 2.0 standard drinks    Types: 2 Shots of liquor per week    Comment: socially  . Drug use: No  . Sexual activity: Not on file  Lifestyle  . Physical activity:    Days per week: Not on file    Minutes per session: Not on file  . Stress: Not on file  Relationships  . Social connections:     Talks on phone: Not on file    Gets together: Not on file    Attends religious service: Not on file    Active member of club or organization: Not on file    Attends meetings of clubs or organizations: Not on file    Relationship status: Not on file  Other Topics Concern  . Not on file  Social History Narrative   Currently works as a Midwife.  Lives with wife and they have two healthy daughters.    Allergies:  Allergies  Allergen Reactions  . Lorazepam     Metabolic Disorder Labs: Lab Results  Component Value Date   HGBA1C 5.9 11/01/2017   No results found for: PROLACTIN Lab Results  Component Value Date   CHOL 139 07/28/2018   TRIG 122.0 07/28/2018   HDL 62.40 07/28/2018   CHOLHDL 2 07/28/2018   VLDL 24.4 07/28/2018   LDLCALC 52 07/28/2018   LDLCALC 42 11/01/2017     Current Medications: Current Outpatient Medications  Medication Sig Dispense Refill  . acetaminophen (TYLENOL) 500 MG tablet Take 500 mg by mouth daily as needed (pain).    [name] ALPRAZolam (XANAX) 0.25 MG tablet 2  qhs 60 tablet 4  . aspirin 81 MG chewable tablet Chew 1 tablet (81 mg total) by mouth daily. 30 tablet 3  . atorvastatin (LIPITOR) 40 MG tablet Take 1 tablet (40 mg total) by mouth daily at 6 PM. 30 tablet 3  . azithromycin (ZITHROMAX) 250 MG tablet 2 tabs po qd x 1 day; 1 tablet per day x 4 days; 6 tablet 0  . buPROPion (WELLBUTRIN XL) 150 MG 24 hr tablet Take 1 tablet (150 mg total) by mouth daily. 30 tablet 2  . clobetasol cream (TEMOVATE) 0.05 % APPLY AS DIRECTED. 30 g 0  . HYDROcodone-homatropine (HYCODAN) 5-1.5 MG/5ML syrup Take 5 mLs by mouth every 8 (eight) hours as needed for cough. 120 mL 0  . hydrOXYzine (ATARAX/VISTARIL) 10 MG tablet TAKE 1 TABLET ONCE DAILY FOR ITCHING 30 tablet 0  . imipramine (TOFRANIL) 10 MG tablet 2 q hs 60 tablet 5  . levothyroxine (SYNTHROID) 88 MCG tablet TAKE 1 TABLET BY MOUTH DAILY. 90 tablet 0  . metoprolol succinate (TOPROL-XL) 25 MG 24 hr tablet TAKE  (1/2) TABLET DAILY. 15 tablet 0  . mupirocin ointment (BACTROBAN) 2 % Apply 1 application  topically 2 (two) times daily. (Patient taking differently: Apply 1 application topically 2 (two) times daily. ) 22 g 0  . nitroGLYCERIN (NITROSTAT) 0.4 MG SL tablet Place 1 tablet (0.4 mg total) under the tongue every 5 (five) minutes as needed for chest pain (CP or SOB). 25 tablet 12  . ramipril (ALTACE) 1.25 MG capsule Take 1 capsule (1.25 mg total) by mouth daily. 30 capsule 3  . tamsulosin (FLOMAX) 0.4 MG CAPS capsule TAKE (1) CAPSULE DAILY. (Patient taking differently: TAKE (2) CAPSULES  DAILY AFTER SUPPER) 30 capsule 3  . temazepam (RESTORIL) 15 MG capsule Take 1 capsule (15 mg total) by mouth at bedtime as needed for sleep. 30 capsule 4  . venlafaxine XR (EFFEXOR-XR) 37.5 MG 24 hr capsule Take 1 capsule (37.5 mg total) by mouth daily. 30 capsule 6   Current Facility-Administered Medications  Medication Dose Route Frequency Provider Last Rate Last Dose  . testosterone cypionate (DEPOTESTOSTERONE CYPIONATE) injection 200 mg  200 mg Intramuscular Q14 Days Elayne Snare, MD   200 mg at 04/28/18 1529  . testosterone cypionate (DEPOTESTOSTERONE CYPIONATE) injection 200 mg  200 mg Intramuscular Q14 Days Janith Lima, MD   200 mg at 09/28/18 0840    Neurologic: Headache: No Seizure: No Paresthesias: No  Musculoskeletal: Strength & Muscle Tone: within normal limits Gait & Station: normal Patient leans: NA  Psychiatric Specialty Exam: ROS  There were no vitals taken for this visit.There is no height or weight on file to calculate BMI.  General Appearance: Fairly Groomed  Eye Contact:  Good  Speech:  Clear and Coherent  Volume:  Normal  Mood:  Euthymic  Affect:  Appropriate  Thought Process:  Coherent  Orientation:  Full (Time, Place, and Person)  Thought Content:  WDL  Suicidal Thoughts:  No  Homicidal Thoughts:  No  Memory:  NA  Judgement:  Good  Insight:  Good  Psychomotor Activity:   Normal  Concentration:  Good  Recall:  Good  Fund of Knowledge: Good  Language: Good  Akathisia:  No  Handed:  Right  AIMS (if indicated):    Assets:  Desire for Improvement  ADL's:  Intact  Cognition: WNL  Sleep:      Treatment/plan  At this time the patient is doing very well.  His first problem is major depression.  He takes Wellbutrin 150 mg and Effexor 75.  I believe he is in remission.  His second problem is that of insomnia.  For this he takes Xanax 0.5 mg 2 at night together with melatonin.  He sleeps very well.  His third problem is that of alcohol use disorder.  On his own is been able to cut back his alcohol and now barely drinks at all.  These 3 problems are well treated and is doing great.  Return to see me in 3 months for 30 minutes.  He is functioning exceedingly well.  Will be turning 86 in about a week.  Jerral Ralph, MD 01/11/2019, 4:16 PM [facility] MD Progress Note  01/11/2019 4:16 PM [name]  MRN:  [id number] Subjective:  Feels well Principal Problem: Major depression, chronic, mild/residual Diagnosis:  Major depression, recurrent residual Today the patient is doing fairly well. He's been having some medical problems. He recently went for Thanksgiving to a length but unfortunately had a urinary tract infection. He also since then has been coughing and recently started on antibiotic. Emotionally he is fairly stable. He denies daily depression. He stays very active  at work. He's been unable to exercise. The patient is sleeping well and has an increase in his appetite. His energy is good. Is no problems thinking or concentrating. He is recently having a productive cough but is no shortness of breath. He denies any chest pain. Is a stable relationship with his wife. His kidneys are good. He has 4 grandchildren. Everybody in his home is good. He only issue is that a number mornings he awakens and he feels oversedated. It occurs point of his wife feels uncomfortable  with him driving will taken to work. Today reviewed his medications and is evident that he likely needs reduction. He also notes that his memory has change in his and is good. 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 Patient Active Problem List   Diagnosis Date Noted  . Mild intermittent asthma with acute exacerbation [J45.21] 02/08/2018  . Hypothyroidism [E03.9] 03/14/2013  . Hyperlipidemia LDL goal <70 [E78.5] 03/14/2013  . BPH (benign prostatic hypertrophy) [N40.0] 03/14/2013  . Hypogonadism, male [E29.1] 03/14/2013  . Erectile dysfunction [N52.9] 03/14/2013   Total Time spent with patient: 30 minutes  Past Psychiatric History:   Past Medical History:  Past Medical History:  Diagnosis Date  . Anxiety   . Arthritis    might be in back, no problems  . BPH (benign prostatic hyperplasia)   . Depression 1990s  . GERD (gastroesophageal reflux disease)    occasional  . Hypercholesteremia    controled  . Hypothyroid   . Shingles May 2014   "Right face, still has some"  . Temporal arteritis ([place])   . Transient blindness of both eyes     Past Surgical History:  Procedure Laterality Date  . ARTERY BIOPSY Right 06/20/2013   Procedure: BIOPSY TEMPORAL ARTERY RIGHT;  Surgeon: Earnstine Regal, MD;  [facility]: [facility];  Service: General;  Laterality: Right;  . CARDIAC CATHETERIZATION N/A 09/02/2015   Procedure: Left Heart Cath and Coronary Angiography;  Surgeon: Charolette Forward, MD;  Location: [place];  Service: Cardiovascular;  Laterality: N/A;  . CARDIAC CATHETERIZATION N/A 09/02/2015   Procedure: Coronary Stent Intervention;  Surgeon: Charolette Forward, MD;  Location: [place] [facility];  Service: Cardiovascular;  Laterality: N/A;  1.  mid RCA      (3.0/28mm Xience) 2.  Mid LAD      (3.0/23mm Xience)  . NO PAST SURGERIES    . None     Family History:  Family History  Problem Relation Age of Onset  . Stroke Father        Died, 80s   . Stroke Sister        Living, 51  . Heart disease Mother        Died, 81  . Healthy Daughter   . Diabetes Maternal Grandmother   . Hypertension Neg Hx    Family Psychiatric  History:  Social History:  Social History   Substance and Sexual Activity  Alcohol Use Yes  . Alcohol/week: 2.0 standard drinks  . Types: 2 Shots of liquor per week   Comment: socially     Social History   Substance and Sexual Activity  Drug Use No    Social History   Socioeconomic History  . Marital status: Married    Spouse name: Not on file  . Number of children: Not on file  . Years of education: Not on file  . Highest education level: Not on file  Occupational History  . Not on file  Social Needs  . Financial resource strain: Not on file  . Food insecurity:    Worry: Not on file    Inability: Not on file  . Transportation needs:    Medical: Not on file    Non-medical: Not on file  Tobacco Use  . Smoking status: Former Smoker    Types: Cigarettes    Last attempt to quit: 08/24/1966    Years since quitting: 52.4  . Smokeless tobacco: Never Used  Substance and Sexual Activity  . Alcohol use: Yes    Alcohol/week: 2.0 standard drinks    Types: 2 Shots of liquor per week    Comment: socially  . Drug use: No  . Sexual activity: Not on file  Lifestyle  . Physical activity:    Days per week: Not on file    Minutes per session: Not on file  . Stress: Not on file  Relationships  . Social connections:    Talks on phone: Not on file    Gets together: Not on file    Attends religious service: Not on file    Active member of club or organization: Not on file    Attends meetings of clubs or organizations: Not on file    Relationship status: Not on file  Other Topics Concern  . Not on file  Social History Narrative   Currently works as a Midwife.  Lives with wife and they have two healthy daughters.   Additional Social History:                         Sleep:  Good  Appetite:  Good  Current Medications: Current Outpatient Medications  Medication Sig Dispense Refill  . acetaminophen (TYLENOL) 500 MG tablet Take 500 mg by mouth daily as needed (pain).    [name] ALPRAZolam (XANAX) 0.25 MG tablet 2  qhs 60 tablet 4  . aspirin 81 MG chewable tablet Chew 1 tablet (81 mg total) by mouth daily. 30 tablet 3  . atorvastatin (LIPITOR) 40 MG tablet Take 1 tablet (40 mg total) by mouth daily at 6 PM. 30 tablet 3  . azithromycin (ZITHROMAX) 250 MG tablet 2 tabs po qd x 1 day; 1 tablet per day x 4 days; 6 tablet 0  . buPROPion (WELLBUTRIN XL) 150 MG 24 hr tablet Take 1 tablet (150 mg total) by mouth daily. 30 tablet 2  . clobetasol cream (TEMOVATE) 0.05 % APPLY AS DIRECTED. 30 g 0  . HYDROcodone-homatropine (HYCODAN) 5-1.5 MG/5ML syrup Take 5 mLs by mouth every 8 (eight) hours as needed for cough. 120 mL 0  . hydrOXYzine (ATARAX/VISTARIL) 10 MG tablet TAKE 1 TABLET ONCE DAILY FOR ITCHING 30 tablet 0  . imipramine (TOFRANIL) 10 MG tablet 2 q hs 60 tablet 5  . levothyroxine (SYNTHROID) 88 MCG tablet TAKE 1 TABLET BY MOUTH DAILY. 90 tablet 0  . metoprolol succinate (TOPROL-XL) 25 MG 24 hr tablet TAKE (1/2) TABLET DAILY. 15 tablet 0  . mupirocin ointment (BACTROBAN) 2 % Apply 1 application topically 2 (two) times daily. (Patient taking differently: Apply 1 application topically 2 (two) times daily. ) 22 g 0  . nitroGLYCERIN (NITROSTAT) 0.4 MG SL tablet Place 1 tablet (0.4 mg total) under the tongue every 5 (five) minutes as needed for chest pain (CP or SOB). 25 tablet 12  . ramipril (ALTACE) 1.25 MG capsule Take 1 capsule (1.25 mg total) by mouth daily.  30 capsule 3  . tamsulosin (FLOMAX) 0.4 MG CAPS capsule TAKE (1) CAPSULE DAILY. (Patient taking differently: TAKE (2) CAPSULES  DAILY AFTER SUPPER) 30 capsule 3  . temazepam (RESTORIL) 15 MG capsule Take 1 capsule (15 mg total) by mouth at bedtime as needed for sleep. 30 capsule 4  . venlafaxine XR (EFFEXOR-XR) 37.5 MG 24  hr capsule Take 1 capsule (37.5 mg total) by mouth daily. 30 capsule 6   Current Facility-Administered Medications  Medication Dose Route Frequency Provider Last Rate Last Dose  . testosterone cypionate (DEPOTESTOSTERONE CYPIONATE) injection 200 mg  200 mg Intramuscular Q14 Days Elayne Snare, MD   200 mg at 04/28/18 1529  . testosterone cypionate (DEPOTESTOSTERONE CYPIONATE) injection 200 mg  200 mg Intramuscular Q14 Days Janith Lima, MD   200 mg at 09/28/18 0840    Lab Results:  No results found for this or any previous visit (from the past 32 hour(s)).  Physical Findings: AIMS:  , ,  ,  ,    CIWA:    COWS:     Musculoskeletal: Strength & Muscle Tone: within normal limits Gait & Station: normal Patient leans: Right  Psychiatric Specialty Exam: ROS  There were no vitals taken for this visit.There is no height or weight on file to calculate BMI.  General Appearance: Negative  Eye Contact::  Good  Speech:  Clear and Coherent  Volume:  Normal  Mood:  NA  Affect:  Appropriate  Thought Process:  Coherent  Orientation:  Full (Time, Place, and Person)  Thought Content:  WDL  Suicidal Thoughts:  No  Homicidal Thoughts:  No  Memory:  NA  Judgement:  Good  Insight:  Good  Psychomotor Activity:  Normal  Concentration:  Good  Recall:  Good  Fund of Knowledge:Good  Language: Good  Akathisia:  No  Handed:  Right  AIMS (if indicated):     Assets:  Desire for Improvement  ADL's:  Intact  Cognition: WNL  Sleep:      Treatment Plan Summary:At this time the patient will continue taking imipramine 10 mg at night and Xanax 0.5 mg. One of his problems with sleep or these medications she does well. Is not on it major problem is that of clinical depression. Again he has problems with compliance. She's taking [place] 150 mg XL all the complaints about extensive. He does state. For reasons that are not clear he stopped his Effexor. Today he is agreed to restart his Effexor 75 mg XL  each morning. Taking Effexor with [place] morning and he takes Xanax and imipramine night. He'll return to see me in 3 months for an opportunity to have some supportive psychotherapy. Presently the patient is not in therapy. I do not think this patient is suicidal at this time. Physically he is very stable. [facility] MD Progress Note  01/11/2019 4:16 PM MARIA GALLICCHIO  MRN:  [id number] Subjective:  Feels well Principal Problem: Major depression, chronic, mild/residual Diagnosis:  Major depression, recurrent residual Today the patient is doing fairly well. He's been having some medical problems. He recently went for Thanksgiving to a length but unfortunately had a urinary tract infection. He also since then has been coughing and recently started on antibiotic. Emotionally he is fairly stable. He denies daily depression. He stays very active at work. He's been unable to exercise. The patient is sleeping well and has an increase in his appetite. His energy is good. Is no problems thinking or concentrating. He is recently having a  productive cough but is no shortness of breath. He denies any chest pain. Is a stable relationship with his wife. His kidneys are good. He has 4 grandchildren. Everybody in his home is good. He only issue is that a number mornings he awakens and he feels oversedated. It occurs point of his wife feels uncomfortable with him driving will taken to work. Today reviewed his medications and is evident that he likely needs reduction. He also notes that his memory has change in his and is good. 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 Patient Active Problem List   Diagnosis Date Noted  . Mild intermittent asthma with acute exacerbation [J45.21] 02/08/2018  . Hypothyroidism [E03.9] 03/14/2013  . Hyperlipidemia LDL goal <70 [E78.5] 03/14/2013  . BPH (benign prostatic hypertrophy) [N40.0] 03/14/2013  . Hypogonadism, male [E29.1] 03/14/2013   . Erectile dysfunction [N52.9] 03/14/2013   Total Time spent with patient: 30 minutes  Past Psychiatric History:   Past Medical History:  Past Medical History:  Diagnosis Date  . Anxiety   . Arthritis    might be in back, no problems  . BPH (benign prostatic hyperplasia)   . Depression 1990s  . GERD (gastroesophageal reflux disease)    occasional  . Hypercholesteremia    controled  . Hypothyroid   . Shingles May 2014   "Right face, still has some"  . Temporal arteritis ([place])   . Transient blindness of both eyes     Past Surgical History:  Procedure Laterality Date  . ARTERY BIOPSY Right 06/20/2013   Procedure: BIOPSY TEMPORAL ARTERY RIGHT;  Surgeon: Earnstine Regal, MD;  Location: [facility];  Service: General;  Laterality: Right;  . CARDIAC CATHETERIZATION N/A 09/02/2015   Procedure: Left Heart Cath and Coronary Angiography;  Surgeon: Charolette Forward, MD;  Location: [place] [facility];  Service: Cardiovascular;  Laterality: N/A;  . CARDIAC CATHETERIZATION N/A 09/02/2015   Procedure: Coronary Stent Intervention;  Surgeon: Charolette Forward, MD;  Location: [place] [facility];  Service: Cardiovascular;  Laterality: N/A;  1.  mid RCA      (3.0/28mm Xience) 2.  Mid LAD      (3.0/23mm Xience)  . NO PAST SURGERIES    . None     Family History:  Family History  Problem Relation Age of Onset  . Stroke Father        Died, 80s  . Stroke Sister        Living, 99  . Heart disease Mother        Died, 63  . Healthy Daughter   . Diabetes Maternal Grandmother   . Hypertension Neg Hx    Family Psychiatric  History:  Social History:  Social History   Substance and Sexual Activity  Alcohol Use Yes  . Alcohol/week: 2.0 standard drinks  . Types: 2 Shots of liquor per week   Comment: socially     Social History   Substance and Sexual Activity  Drug Use No    Social History   Socioeconomic History  . Marital status: Married    Spouse name: Not on file  . Number of children: Not on  file  . Years of education: Not on file  . Highest education level: Not on file  Occupational History  . Not on file  Social Needs  . Financial resource strain: Not on file  . Food insecurity:    Worry: Not on file    Inability:  Not on file  . Transportation needs:    Medical: Not on file    Non-medical: Not on file  Tobacco Use  . Smoking status: Former Smoker    Types: Cigarettes    Last attempt to quit: 08/24/1966    Years since quitting: 52.4  . Smokeless tobacco: Never Used  Substance and Sexual Activity  . Alcohol use: Yes    Alcohol/week: 2.0 standard drinks    Types: 2 Shots of liquor per week    Comment: socially  . Drug use: No  . Sexual activity: Not on file  Lifestyle  . Physical activity:    Days per week: Not on file    Minutes per session: Not on file  . Stress: Not on file  Relationships  . Social connections:    Talks on phone: Not on file    Gets together: Not on file    Attends religious service: Not on file    Active member of club or organization: Not on file    Attends meetings of clubs or organizations: Not on file    Relationship status: Not on file  Other Topics Concern  . Not on file  Social History Narrative   Currently works as a Midwife.  Lives with wife and they have two healthy daughters.   Additional Social History:                         Sleep: Good  Appetite:  Good  Current Medications: Current Outpatient Medications  Medication Sig Dispense Refill  . acetaminophen (TYLENOL) 500 MG tablet Take 500 mg by mouth daily as needed (pain).    [name] ALPRAZolam (XANAX) 0.25 MG tablet 2  qhs 60 tablet 4  . aspirin 81 MG chewable tablet Chew 1 tablet (81 mg total) by mouth daily. 30 tablet 3  . atorvastatin (LIPITOR) 40 MG tablet Take 1 tablet (40 mg total) by mouth daily at 6 PM. 30 tablet 3  . azithromycin (ZITHROMAX) 250 MG tablet 2 tabs po qd x 1 day; 1 tablet per day x 4 days; 6 tablet 0  . buPROPion (WELLBUTRIN XL) 150  MG 24 hr tablet Take 1 tablet (150 mg total) by mouth daily. 30 tablet 2  . clobetasol cream (TEMOVATE) 0.05 % APPLY AS DIRECTED. 30 g 0  . HYDROcodone-homatropine (HYCODAN) 5-1.5 MG/5ML syrup Take 5 mLs by mouth every 8 (eight) hours as needed for cough. 120 mL 0  . hydrOXYzine (ATARAX/VISTARIL) 10 MG tablet TAKE 1 TABLET ONCE DAILY FOR ITCHING 30 tablet 0  . imipramine (TOFRANIL) 10 MG tablet 2 q hs 60 tablet 5  . levothyroxine (SYNTHROID) 88 MCG tablet TAKE 1 TABLET BY MOUTH DAILY. 90 tablet 0  . metoprolol succinate (TOPROL-XL) 25 MG 24 hr tablet TAKE (1/2) TABLET DAILY. 15 tablet 0  . mupirocin ointment (BACTROBAN) 2 % Apply 1 application topically 2 (two) times daily. (Patient taking differently: Apply 1 application topically 2 (two) times daily. ) 22 g 0  . nitroGLYCERIN (NITROSTAT) 0.4 MG SL tablet Place 1 tablet (0.4 mg total) under the tongue every 5 (five) minutes as needed for chest pain (CP or SOB). 25 tablet 12  . ramipril (ALTACE) 1.25 MG capsule Take 1 capsule (1.25 mg total) by mouth daily. 30 capsule 3  . tamsulosin (FLOMAX) 0.4 MG CAPS capsule TAKE (1) CAPSULE DAILY. (Patient taking differently: TAKE (2) CAPSULES  DAILY AFTER SUPPER) 30 capsule 3  . temazepam (RESTORIL) 15  MG capsule Take 1 capsule (15 mg total) by mouth at bedtime as needed for sleep. 30 capsule 4  . venlafaxine XR (EFFEXOR-XR) 37.5 MG 24 hr capsule Take 1 capsule (37.5 mg total) by mouth daily. 30 capsule 6   Current Facility-Administered Medications  Medication Dose Route Frequency Provider Last Rate Last Dose  . testosterone cypionate (DEPOTESTOSTERONE CYPIONATE) injection 200 mg  200 mg Intramuscular Q14 Days Elayne Snare, MD   200 mg at 04/28/18 1529  . testosterone cypionate (DEPOTESTOSTERONE CYPIONATE) injection 200 mg  200 mg Intramuscular Q14 Days Janith Lima, MD   200 mg at 09/28/18 0840    Lab Results:  No results found for this or any previous visit (from the past 2 hour(s)).  Physical  Findings: AIMS:  , ,  ,  ,    CIWA:    COWS:     Musculoskeletal: Strength & Muscle Tone: within normal limits Gait & Station: normal Patient leans: Right  Psychiatric Specialty Exam: ROS  There were no vitals taken for this visit.There is no height or weight on file to calculate BMI.  General Appearance: Negative  Eye Contact::  Good  Speech:  Clear and Coherent  Volume:  Normal  Mood:  NA  Affect:  Appropriate  Thought Process:  Coherent  Orientation:  Full (Time, Place, and Person)  Thought Content:  WDL  Suicidal Thoughts:  No  Homicidal Thoughts:  No  Memory:  NA  Judgement:  Good  Insight:  Good  Psychomotor Activity:  Normal  Concentration:  Good  Recall:  Good  Fund of Knowledge:Good  Language: Good  Akathisia:  No  Handed:  Right  AIMS (if indicated):     Assets:  Desire for Improvement  ADL's:  Intact  Cognition: WNL  Sleep:      Treatment Plan Summary: This patient has 2 stable problems.  His first problem is that of clinical depression.  He takes Effexor and does very well.  He also takes a small dose of imipramine at night.  His second problem is that of insomnia.  He takes melatonin and at times he takes Restoril 15 mg.  Today he is assess to slightly increase his Xanax.  We will go ahead and increase it to a dose of 0.25 mg taking 2 at night.  Patient is no longer drinking.  He is functioning very well.  He will return to see me in 3 months.

## 2019-01-18 ENCOUNTER — Other Ambulatory Visit: Payer: Self-pay | Admitting: Endocrinology

## 2019-01-18 ENCOUNTER — Other Ambulatory Visit: Payer: Self-pay | Admitting: Internal Medicine

## 2019-01-19 DIAGNOSIS — I10 Essential (primary) hypertension: Secondary | ICD-10-CM | POA: Diagnosis not present

## 2019-01-19 DIAGNOSIS — E785 Hyperlipidemia, unspecified: Secondary | ICD-10-CM | POA: Diagnosis not present

## 2019-01-19 DIAGNOSIS — M1990 Unspecified osteoarthritis, unspecified site: Secondary | ICD-10-CM | POA: Diagnosis not present

## 2019-01-19 DIAGNOSIS — J45909 Unspecified asthma, uncomplicated: Secondary | ICD-10-CM | POA: Diagnosis not present

## 2019-01-19 DIAGNOSIS — N189 Chronic kidney disease, unspecified: Secondary | ICD-10-CM | POA: Diagnosis not present

## 2019-01-19 DIAGNOSIS — I25118 Atherosclerotic heart disease of native coronary artery with other forms of angina pectoris: Secondary | ICD-10-CM | POA: Diagnosis not present

## 2019-01-26 ENCOUNTER — Telehealth (HOSPITAL_COMMUNITY): Payer: Self-pay

## 2019-01-26 NOTE — Telephone Encounter (Signed)
Patient is calling to let you know that the Alprazolam is not working to help him sleep. He states that he is not sleeping through the night and would like to increase the dose. Please review and advise, thank you

## 2019-02-08 DIAGNOSIS — R079 Chest pain, unspecified: Secondary | ICD-10-CM | POA: Diagnosis not present

## 2019-02-08 DIAGNOSIS — S233XXA Sprain of ligaments of thoracic spine, initial encounter: Secondary | ICD-10-CM | POA: Diagnosis not present

## 2019-02-08 DIAGNOSIS — S2231XA Fracture of one rib, right side, initial encounter for closed fracture: Secondary | ICD-10-CM | POA: Diagnosis not present

## 2019-02-08 DIAGNOSIS — S139XXA Sprain of joints and ligaments of unspecified parts of neck, initial encounter: Secondary | ICD-10-CM | POA: Diagnosis not present

## 2019-02-13 ENCOUNTER — Other Ambulatory Visit: Payer: Self-pay | Admitting: Internal Medicine

## 2019-03-03 DIAGNOSIS — S2242XA Multiple fractures of ribs, left side, initial encounter for closed fracture: Secondary | ICD-10-CM | POA: Diagnosis not present

## 2019-03-03 DIAGNOSIS — S20219A Contusion of unspecified front wall of thorax, initial encounter: Secondary | ICD-10-CM | POA: Diagnosis not present

## 2019-03-20 ENCOUNTER — Other Ambulatory Visit (HOSPITAL_COMMUNITY): Payer: Self-pay

## 2019-03-20 MED ORDER — BUPROPION HCL ER (XL) 150 MG PO TB24: 150.0000 mg | ORAL_TABLET | Freq: Every day | ORAL | 2 refills | Status: DC

## 2019-04-10 DIAGNOSIS — N401 Enlarged prostate with lower urinary tract symptoms: Secondary | ICD-10-CM | POA: Diagnosis not present

## 2019-04-10 DIAGNOSIS — R3914 Feeling of incomplete bladder emptying: Secondary | ICD-10-CM | POA: Diagnosis not present

## 2019-04-11 ENCOUNTER — Other Ambulatory Visit (HOSPITAL_COMMUNITY): Payer: Self-pay

## 2019-04-11 MED ORDER — ALPRAZOLAM 0.25 MG PO TABS: ORAL_TABLET | ORAL | 0 refills | Status: DC

## 2019-04-13 ENCOUNTER — Other Ambulatory Visit: Payer: Self-pay

## 2019-04-13 ENCOUNTER — Ambulatory Visit (INDEPENDENT_AMBULATORY_CARE_PROVIDER_SITE_OTHER): Payer: Medicare Other | Admitting: Psychiatry

## 2019-04-13 ENCOUNTER — Other Ambulatory Visit: Payer: Self-pay | Admitting: Internal Medicine

## 2019-04-13 DIAGNOSIS — Z87891 Personal history of nicotine dependence: Secondary | ICD-10-CM

## 2019-04-13 DIAGNOSIS — F324 Major depressive disorder, single episode, in partial remission: Secondary | ICD-10-CM | POA: Diagnosis not present

## 2019-04-13 MED ORDER — VENLAFAXINE HCL ER 37.5 MG PO CP24: 37.5000 mg | ORAL_CAPSULE | Freq: Every day | ORAL | 6 refills | Status: DC

## 2019-04-13 MED ORDER — BUPROPION HCL ER (XL) 150 MG PO TB24: 150.0000 mg | ORAL_TABLET | Freq: Every day | ORAL | 2 refills | Status: DC

## 2019-04-13 MED ORDER — IMIPRAMINE HCL 10 MG PO TABS: ORAL_TABLET | ORAL | 5 refills | Status: DC

## 2019-04-13 NOTE — Progress Notes (Signed)
Patient ID: Benjamin Hahn, male   DOB: 10-04-1932, 85 y.o.   MRN: [id number] [facility] MD/PA/NP OP Progress Note  04/13/2019 3:52 PM [facility]  MRN:  [id number]  Chief Complaint: Major depression remission Subjective: Doing great  This patient is being treated for major depression and is in remission.  Patient is doing only fairly well.  He says in the last 4 to 6 months she started feeling more more depressed.  He actually had 2 falls.  1 was just slipped putting on his pants and the other one up walking up steps.  I do not think he is oversedated.  On the other hand he says he has a lot of leg weaknesses.  He says he is having a hard time getting up from the toilet.  This is unusual for this individual.  He is seeing Dr. Ronnald Ramp his primary care doctor in a week.  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  He does feel somewhat isolated with his pandemic.  He is very cautious he does not go out.  His wife seems to be okay.  Their relationship seems to be good.  Other than these 2 falls medically he has not changed all that much.  I believe financially he is stable.  He went to see his urologist and was told with the possibility that imipramine was having negative effects on his urinary system.  I suspect he is applying that it may cause some degree of retention.  The patient been taking imipramine for 20 years and is very committed to it.  Today he agreed to reduce it from 20 mg down to 10 mg.  Patient takes all his other medications as prescribed.  He no longer drinks significant amounts of alcohol.  He does have 1 or 2 glasses of wine but as he says no liquor.  He denies any chest pain or shortness of breath or any neurological symptoms at this time.  Past Medical History:  Diagnosis Date  . Anxiety   . Arthritis    might be in back, no problems  . BPH (benign prostatic  hyperplasia)   . Depression 1990s  . GERD (gastroesophageal reflux disease)    occasional  . Hypercholesteremia    controled  . Hypothyroid   . Shingles May 2014   "Right face, still has some"  . Temporal arteritis ([place])   . Transient blindness of both eyes     Past Surgical History:  Procedure Laterality Date  . ARTERY BIOPSY Right 06/20/2013   Procedure: BIOPSY TEMPORAL ARTERY RIGHT;  Surgeon: Earnstine Regal, MD;  Location: [facility];  Service: General;  Laterality: Right;  . CARDIAC CATHETERIZATION N/A 09/02/2015   Procedure: Left Heart Cath and Coronary Angiography;  Surgeon: Charolette Forward, MD;  Location: [place] [facility];  Service: Cardiovascular;  Laterality: N/A;  . CARDIAC CATHETERIZATION N/A 09/02/2015   Procedure: Coronary Stent Intervention;  Surgeon: Charolette Forward, MD;  Location: [place] [facility];  Service: Cardiovascular;  Laterality: N/A;  1.  mid RCA      (3.0/28mm Xience) 2.  Mid LAD      (3.0/23mm Xience)  . NO PAST SURGERIES    . None      Family Psychiatric History:   Family History:  Family History  Problem Relation Age of Onset  . Stroke Father  Died, 80s  . Stroke Sister        Living, 77  . Heart disease Mother        Died, 7  . Healthy Daughter   . Diabetes Maternal Grandmother   . Hypertension Neg Hx     Social History:  Social History   Socioeconomic History  . Marital status: Married    Spouse name: Not on file  . Number of children: Not on file  . Years of education: Not on file  . Highest education level: Not on file  Occupational History  . Not on file  Social Needs  . Financial resource strain: Not on file  . Food insecurity    Worry: Not on file    Inability: Not on file  . Transportation needs    Medical: Not on file    Non-medical: Not on file  Tobacco Use  . Smoking status: Former Smoker    Types: Cigarettes    Quit date: 08/24/1966    Years since quitting: 52.6  . Smokeless tobacco: Never Used  Substance and  Sexual Activity  . Alcohol use: Yes    Alcohol/week: 2.0 standard drinks    Types: 2 Shots of liquor per week    Comment: socially  . Drug use: No  . Sexual activity: Not on file  Lifestyle  . Physical activity    Days per week: Not on file    Minutes per session: Not on file  . Stress: Not on file  Relationships  . Social Herbalist on phone: Not on file    Gets together: Not on file    Attends religious service: Not on file    Active member of club or organization: Not on file    Attends meetings of clubs or organizations: Not on file    Relationship status: Not on file  Other Topics Concern  . Not on file  Social History Narrative   Currently works as a Midwife.  Lives with wife and they have two healthy daughters.    Allergies:  Allergies  Allergen Reactions  . Lorazepam     Metabolic Disorder Labs: Lab Results  Component Value Date   HGBA1C 5.9 11/01/2017   No results found for: PROLACTIN Lab Results  Component Value Date   CHOL 139 07/28/2018   TRIG 122.0 07/28/2018   HDL 62.40 07/28/2018   CHOLHDL 2 07/28/2018   VLDL 24.4 07/28/2018   LDLCALC 52 07/28/2018   LDLCALC 42 11/01/2017     Current Medications: Current Outpatient Medications  Medication Sig Dispense Refill  . acetaminophen (TYLENOL) 500 MG tablet Take 500 mg by mouth daily as needed (pain).    [name] ALPRAZolam (XANAX) 0.25 MG tablet 2  qhs 60 tablet 0  . aspirin 81 MG chewable tablet Chew 1 tablet (81 mg total) by mouth daily. 30 tablet 3  . atorvastatin (LIPITOR) 40 MG tablet Take 1 tablet (40 mg total) by mouth daily at 6 PM. 30 tablet 3  . azithromycin (ZITHROMAX) 250 MG tablet 2 tabs po qd x 1 day; 1 tablet per day x 4 days; 6 tablet 0  . buPROPion (WELLBUTRIN XL) 150 MG 24 hr tablet Take 1 tablet (150 mg total) by mouth daily. 2  qam 30 tablet 2  . clobetasol cream (TEMOVATE) 0.05 % APPLY AS DIRECTED. 30 g 0  . HYDROcodone-homatropine (HYCODAN) 5-1.5 MG/5ML syrup Take 5 mLs  by mouth every 8 (eight) hours as needed for cough.  120 mL 0  . hydrOXYzine (ATARAX/VISTARIL) 10 MG tablet TAKE 1 TABLET ONCE DAILY FOR ITCHING 30 tablet 0  . imipramine (TOFRANIL) 10 MG tablet 1 q hs 30 tablet 5  . levothyroxine (SYNTHROID) 88 MCG tablet TAKE 1 TABLET BY MOUTH DAILY. 30 tablet 0  . metoprolol succinate (TOPROL-XL) 25 MG 24 hr tablet TAKE (1/2) TABLET DAILY. 15 tablet 5  . mupirocin ointment (BACTROBAN) 2 % Apply 1 application topically 2 (two) times daily. (Patient taking differently: Apply 1 application topically 2 (two) times daily. ) 22 g 0  . nitroGLYCERIN (NITROSTAT) 0.4 MG SL tablet Place 1 tablet (0.4 mg total) under the tongue every 5 (five) minutes as needed for chest pain (CP or SOB). 25 tablet 12  . ramipril (ALTACE) 1.25 MG capsule Take 1 capsule (1.25 mg total) by mouth daily. 30 capsule 3  . tamsulosin (FLOMAX) 0.4 MG CAPS capsule TAKE (1) CAPSULE DAILY. (Patient taking differently: TAKE (2) CAPSULES  DAILY AFTER SUPPER) 30 capsule 3  . venlafaxine XR (EFFEXOR-XR) 37.5 MG 24 hr capsule Take 1 capsule (37.5 mg total) by mouth daily. 30 capsule 6   Current Facility-Administered Medications  Medication Dose Route Frequency Provider Last Rate Last Dose  . testosterone cypionate (DEPOTESTOSTERONE CYPIONATE) injection 200 mg  200 mg Intramuscular Q14 Days Elayne Snare, MD   200 mg at 04/28/18 1529  . testosterone cypionate (DEPOTESTOSTERONE CYPIONATE) injection 200 mg  200 mg Intramuscular Q14 Days Janith Lima, MD   200 mg at 09/28/18 0840    Neurologic: Headache: No Seizure: No Paresthesias: No  Musculoskeletal: Strength & Muscle Tone: within normal limits Gait & Station: normal Patient leans: NA  Psychiatric Specialty Exam: ROS  There were no vitals taken for this visit.There is no height or weight on file to calculate BMI.  General Appearance: Fairly Groomed  Eye Contact:  Good  Speech:  Clear and Coherent  Volume:  Normal  Mood:  Euthymic  Affect:   Appropriate  Thought Process:  Coherent  Orientation:  Full (Time, Place, and Person)  Thought Content:  WDL  Suicidal Thoughts:  No  Homicidal Thoughts:  No  Memory:  NA  Judgement:  Good  Insight:  Good  Psychomotor Activity:  Normal  Concentration:  Good  Recall:  Good  Fund of Knowledge: Good  Language: Good  Akathisia:  No  Handed:  Right  AIMS (if indicated):    Assets:  Desire for Improvement  ADL's:  Intact  Cognition: WNL  Sleep:      Treatment/plan  Today the patient has some degree of decline.  He does describe increased depressed mood state probably related to the circumstances of the pandemic and is failed practice.  This is a very independent individual.  His #1 problem is major depression and with the possibility that he has declined I will go ahead and double his Wellbutrin.  Instead of just taking 150 mg will increase it to 300 mg.  My hope is that this will increase his energy.  On the other hand when his primary care physician evaluates him he should look closely at the issue of leg strength.  I will go ahead and reduce his imipramine down from 20 mg to a dose of 10 mg.  I would be completely willing to discontinue it completely when I see him again in about 1 to 2 months.  I strongly encouraged him to keep the alcohol at a minimal.  The patient will continue taking a low-dose of  Xanax 0.5 mg 2 at night.  This clearly helps him sleep.  He claims his appetite is actually okay.  He has a good sense of worth.  He is not suicidal.  He does not appear to be anhedonic.  I would not be very much more aggressive treatment of his depression other than a full dose of Wellbutrin.  I think reducing his imipramine is a good thing to do for this 85 year old gentleman.  My intention is to get rid of it.  Jerral Ralph, MD 04/13/2019, 3:52 PM [facility] MD Progress Note  04/13/2019 3:52 PM Benjamin Hahn  MRN:  [id number] Subjective:  Feels well Principal Problem: Major depression,  chronic, mild/residual Diagnosis:  Major depression, recurrent residual Today the patient is doing fairly well. He's been having some medical problems. He recently went for Thanksgiving to a length but unfortunately had a urinary tract infection. He also since then has been coughing and recently started on antibiotic. Emotionally he is fairly stable. He denies daily depression. He stays very active at work. He's been unable to exercise. The patient is sleeping well and has an increase in his appetite. His energy is good. Is no problems thinking or concentrating. He is recently having a productive cough but is no shortness of breath. He denies any chest pain. Is a stable relationship with his wife. His kidneys are good. He has 4 grandchildren. Everybody in his home is good. He only issue is that a number mornings he awakens and he feels oversedated. It occurs point of his wife feels uncomfortable with him driving will taken to work. Today reviewed his medications and is evident that he likely needs reduction. He also notes that his memory has change in his and is good. 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 Patient Active Problem List   Diagnosis Date Noted  . Mild intermittent asthma with acute exacerbation [J45.21] 02/08/2018  . Hypothyroidism [E03.9] 03/14/2013  . Hyperlipidemia LDL goal <70 [E78.5] 03/14/2013  . BPH (benign prostatic hypertrophy) [N40.0] 03/14/2013  . Hypogonadism, male [E29.1] 03/14/2013  . Erectile dysfunction [N52.9] 03/14/2013   Total Time spent with patient: 30 minutes  Past Psychiatric History:   Past Medical History:  Past Medical History:  Diagnosis Date  . Anxiety   . Arthritis    might be in back, no problems  . BPH (benign prostatic hyperplasia)   . Depression 1990s  . GERD (gastroesophageal reflux disease)    occasional  . Hypercholesteremia    controled  . Hypothyroid   . Shingles May 2014   "Right  face, still has some"  . Temporal arteritis ([place])   . Transient blindness of both eyes     Past Surgical History:  Procedure Laterality Date  . ARTERY BIOPSY Right 06/20/2013   Procedure: BIOPSY TEMPORAL ARTERY RIGHT;  Surgeon: Earnstine Regal, MD;  Location: [facility];  Service: General;  Laterality: Right;  . CARDIAC CATHETERIZATION N/A 09/02/2015   Procedure: Left Heart Cath and Coronary Angiography;  Surgeon: Charolette Forward, MD;  Location: [place] [facility];  Service: Cardiovascular;  Laterality: N/A;  . CARDIAC CATHETERIZATION N/A 09/02/2015   Procedure: Coronary Stent Intervention;  Surgeon: Charolette Forward, MD;  Location: [place] [facility];  Service: Cardiovascular;  Laterality: N/A;  1.  mid RCA      (3.0/28mm Xience) 2.  Mid LAD      (3.0/23mm Xience)  . NO PAST SURGERIES    .  None     Family History:  Family History  Problem Relation Age of Onset  . Stroke Father        Died, 80s  . Stroke Sister        Living, 64  . Heart disease Mother        Died, 41  . Healthy Daughter   . Diabetes Maternal Grandmother   . Hypertension Neg Hx    Family Psychiatric  History:  Social History:  Social History   Substance and Sexual Activity  Alcohol Use Yes  . Alcohol/week: 2.0 standard drinks  . Types: 2 Shots of liquor per week   Comment: socially     Social History   Substance and Sexual Activity  Drug Use No    Social History   Socioeconomic History  . Marital status: Married    Spouse name: Not on file  . Number of children: Not on file  . Years of education: Not on file  . Highest education level: Not on file  Occupational History  . Not on file  Social Needs  . Financial resource strain: Not on file  . Food insecurity    Worry: Not on file    Inability: Not on file  . Transportation needs    Medical: Not on file    Non-medical: Not on file  Tobacco Use  . Smoking status: Former Smoker    Types: Cigarettes    Quit date: 08/24/1966    Years since quitting: 52.6   . Smokeless tobacco: Never Used  Substance and Sexual Activity  . Alcohol use: Yes    Alcohol/week: 2.0 standard drinks    Types: 2 Shots of liquor per week    Comment: socially  . Drug use: No  . Sexual activity: Not on file  Lifestyle  . Physical activity    Days per week: Not on file    Minutes per session: Not on file  . Stress: Not on file  Relationships  . Social Herbalist on phone: Not on file    Gets together: Not on file    Attends religious service: Not on file    Active member of club or organization: Not on file    Attends meetings of clubs or organizations: Not on file    Relationship status: Not on file  Other Topics Concern  . Not on file  Social History Narrative   Currently works as a Midwife.  Lives with wife and they have two healthy daughters.   Additional Social History:                         Sleep: Good  Appetite:  Good  Current Medications: Current Outpatient Medications  Medication Sig Dispense Refill  . acetaminophen (TYLENOL) 500 MG tablet Take 500 mg by mouth daily as needed (pain).    [name] ALPRAZolam (XANAX) 0.25 MG tablet 2  qhs 60 tablet 0  . aspirin 81 MG chewable tablet Chew 1 tablet (81 mg total) by mouth daily. 30 tablet 3  . atorvastatin (LIPITOR) 40 MG tablet Take 1 tablet (40 mg total) by mouth daily at 6 PM. 30 tablet 3  . azithromycin (ZITHROMAX) 250 MG tablet 2 tabs po qd x 1 day; 1 tablet per day x 4 days; 6 tablet 0  . buPROPion (WELLBUTRIN XL) 150 MG 24 hr tablet Take 1 tablet (150 mg total) by mouth daily. 2  qam 30 tablet  2  . clobetasol cream (TEMOVATE) 0.05 % APPLY AS DIRECTED. 30 g 0  . HYDROcodone-homatropine (HYCODAN) 5-1.5 MG/5ML syrup Take 5 mLs by mouth every 8 (eight) hours as needed for cough. 120 mL 0  . hydrOXYzine (ATARAX/VISTARIL) 10 MG tablet TAKE 1 TABLET ONCE DAILY FOR ITCHING 30 tablet 0  . imipramine (TOFRANIL) 10 MG tablet 1 q hs 30 tablet 5  . levothyroxine (SYNTHROID) 88 MCG  tablet TAKE 1 TABLET BY MOUTH DAILY. 30 tablet 0  . metoprolol succinate (TOPROL-XL) 25 MG 24 hr tablet TAKE (1/2) TABLET DAILY. 15 tablet 5  . mupirocin ointment (BACTROBAN) 2 % Apply 1 application topically 2 (two) times daily. (Patient taking differently: Apply 1 application topically 2 (two) times daily. ) 22 g 0  . nitroGLYCERIN (NITROSTAT) 0.4 MG SL tablet Place 1 tablet (0.4 mg total) under the tongue every 5 (five) minutes as needed for chest pain (CP or SOB). 25 tablet 12  . ramipril (ALTACE) 1.25 MG capsule Take 1 capsule (1.25 mg total) by mouth daily. 30 capsule 3  . tamsulosin (FLOMAX) 0.4 MG CAPS capsule TAKE (1) CAPSULE DAILY. (Patient taking differently: TAKE (2) CAPSULES  DAILY AFTER SUPPER) 30 capsule 3  . venlafaxine XR (EFFEXOR-XR) 37.5 MG 24 hr capsule Take 1 capsule (37.5 mg total) by mouth daily. 30 capsule 6   Current Facility-Administered Medications  Medication Dose Route Frequency Provider Last Rate Last Dose  . testosterone cypionate (DEPOTESTOSTERONE CYPIONATE) injection 200 mg  200 mg Intramuscular Q14 Days Elayne Snare, MD   200 mg at 04/28/18 1529  . testosterone cypionate (DEPOTESTOSTERONE CYPIONATE) injection 200 mg  200 mg Intramuscular Q14 Days Janith Lima, MD   200 mg at 09/28/18 0840    Lab Results:  No results found for this or any previous visit (from the past 1 hour(s)).  Physical Findings: AIMS:  , ,  ,  ,    CIWA:    COWS:     Musculoskeletal: Strength & Muscle Tone: within normal limits Gait & Station: normal Patient leans: Right  Psychiatric Specialty Exam: ROS  There were no vitals taken for this visit.There is no height or weight on file to calculate BMI.  General Appearance: Negative  Eye Contact::  Good  Speech:  Clear and Coherent  Volume:  Normal  Mood:  NA  Affect:  Appropriate  Thought Process:  Coherent  Orientation:  Full (Time, Place, and Person)  Thought Content:  WDL  Suicidal Thoughts:  No  Homicidal Thoughts:  No   Memory:  NA  Judgement:  Good  Insight:  Good  Psychomotor Activity:  Normal  Concentration:  Good  Recall:  Good  Fund of Knowledge:Good  Language: Good  Akathisia:  No  Handed:  Right  AIMS (if indicated):     Assets:  Desire for Improvement  ADL's:  Intact  Cognition: WNL  Sleep:      Treatment Plan Summary:At this time the patient will continue taking imipramine 10 mg at night and Xanax 0.5 mg. One of his problems with sleep or these medications she does well. Is not on it major problem is that of clinical depression. Again he has problems with compliance. She's taking [place] 150 mg XL all the complaints about extensive. He does state. For reasons that are not clear he stopped his Effexor. Today he is agreed to restart his Effexor 75 mg XL each morning. Taking Effexor with [place] morning and he takes Xanax and imipramine night. He'll return to  see me in 3 months for an opportunity to have some supportive psychotherapy. Presently the patient is not in therapy. I do not think this patient is suicidal at this time. Physically he is very stable. [facility] MD Progress Note  04/13/2019 3:52 PM Benjamin Hahn  MRN:  [id number] Subjective:  Feels well Principal Problem: Major depression, chronic, mild/residual Diagnosis:  Major depression, recurrent residual Today the patient is doing fairly well. He's been having some medical problems. He recently went for Thanksgiving to a length but unfortunately had a urinary tract infection. He also since then has been coughing and recently started on antibiotic. Emotionally he is fairly stable. He denies daily depression. He stays very active at work. He's been unable to exercise. The patient is sleeping well and has an increase in his appetite. His energy is good. Is no problems thinking or concentrating. He is recently having a productive cough but is no shortness of breath. He denies any chest pain. Is a stable relationship with his wife. His  kidneys are good. He has 4 grandchildren. Everybody in his home is good. He only issue is that a number mornings he awakens and he feels oversedated. It occurs point of his wife feels uncomfortable with him driving will taken to work. Today reviewed his medications and is evident that he likely needs reduction. He also notes that his memory has change in his and is good. 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 Patient Active Problem List   Diagnosis Date Noted  . Mild intermittent asthma with acute exacerbation [J45.21] 02/08/2018  . Hypothyroidism [E03.9] 03/14/2013  . Hyperlipidemia LDL goal <70 [E78.5] 03/14/2013  . BPH (benign prostatic hypertrophy) [N40.0] 03/14/2013  . Hypogonadism, male [E29.1] 03/14/2013  . Erectile dysfunction [N52.9] 03/14/2013   Total Time spent with patient: 30 minutes  Past Psychiatric History:   Past Medical History:  Past Medical History:  Diagnosis Date  . Anxiety   . Arthritis    might be in back, no problems  . BPH (benign prostatic hyperplasia)   . Depression 1990s  . GERD (gastroesophageal reflux disease)    occasional  . Hypercholesteremia    controled  . Hypothyroid   . Shingles May 2014   "Right face, still has some"  . Temporal arteritis ([place])   . Transient blindness of both eyes     Past Surgical History:  Procedure Laterality Date  . ARTERY BIOPSY Right 06/20/2013   Procedure: BIOPSY TEMPORAL ARTERY RIGHT;  Surgeon: Earnstine Regal, MD;  Location: [facility];  Service: General;  Laterality: Right;  . CARDIAC CATHETERIZATION N/A 09/02/2015   Procedure: Left Heart Cath and Coronary Angiography;  Surgeon: Charolette Forward, MD;  Location: [place] [facility];  Service: Cardiovascular;  Laterality: N/A;  . CARDIAC CATHETERIZATION N/A 09/02/2015   Procedure: Coronary Stent Intervention;  Surgeon: Charolette Forward, MD;  Location: [place] [facility];  Service: Cardiovascular;  Laterality: N/A;  1.  mid RCA       (3.0/28mm Xience) 2.  Mid LAD      (3.0/23mm Xience)  . NO PAST SURGERIES    . None     Family History:  Family History  Problem Relation Age of Onset  . Stroke Father        Died, 80s  . Stroke Sister        Living, 49  . Heart disease Mother        Died, 16  .  Healthy Daughter   . Diabetes Maternal Grandmother   . Hypertension Neg Hx    Family Psychiatric  History:  Social History:  Social History   Substance and Sexual Activity  Alcohol Use Yes  . Alcohol/week: 2.0 standard drinks  . Types: 2 Shots of liquor per week   Comment: socially     Social History   Substance and Sexual Activity  Drug Use No    Social History   Socioeconomic History  . Marital status: Married    Spouse name: Not on file  . Number of children: Not on file  . Years of education: Not on file  . Highest education level: Not on file  Occupational History  . Not on file  Social Needs  . Financial resource strain: Not on file  . Food insecurity    Worry: Not on file    Inability: Not on file  . Transportation needs    Medical: Not on file    Non-medical: Not on file  Tobacco Use  . Smoking status: Former Smoker    Types: Cigarettes    Quit date: 08/24/1966    Years since quitting: 52.6  . Smokeless tobacco: Never Used  Substance and Sexual Activity  . Alcohol use: Yes    Alcohol/week: 2.0 standard drinks    Types: 2 Shots of liquor per week    Comment: socially  . Drug use: No  . Sexual activity: Not on file  Lifestyle  . Physical activity    Days per week: Not on file    Minutes per session: Not on file  . Stress: Not on file  Relationships  . Social Herbalist on phone: Not on file    Gets together: Not on file    Attends religious service: Not on file    Active member of club or organization: Not on file    Attends meetings of clubs or organizations: Not on file    Relationship status: Not on file  Other Topics Concern  . Not on file  Social History  Narrative   Currently works as a Midwife.  Lives with wife and they have two healthy daughters.   Additional Social History:                         Sleep: Good  Appetite:  Good  Current Medications: Current Outpatient Medications  Medication Sig Dispense Refill  . acetaminophen (TYLENOL) 500 MG tablet Take 500 mg by mouth daily as needed (pain).    [name] ALPRAZolam (XANAX) 0.25 MG tablet 2  qhs 60 tablet 0  . aspirin 81 MG chewable tablet Chew 1 tablet (81 mg total) by mouth daily. 30 tablet 3  . atorvastatin (LIPITOR) 40 MG tablet Take 1 tablet (40 mg total) by mouth daily at 6 PM. 30 tablet 3  . azithromycin (ZITHROMAX) 250 MG tablet 2 tabs po qd x 1 day; 1 tablet per day x 4 days; 6 tablet 0  . buPROPion (WELLBUTRIN XL) 150 MG 24 hr tablet Take 1 tablet (150 mg total) by mouth daily. 2  qam 30 tablet 2  . clobetasol cream (TEMOVATE) 0.05 % APPLY AS DIRECTED. 30 g 0  . HYDROcodone-homatropine (HYCODAN) 5-1.5 MG/5ML syrup Take 5 mLs by mouth every 8 (eight) hours as needed for cough. 120 mL 0  . hydrOXYzine (ATARAX/VISTARIL) 10 MG tablet TAKE 1 TABLET ONCE DAILY FOR ITCHING 30 tablet 0  . imipramine (TOFRANIL)  10 MG tablet 1 q hs 30 tablet 5  . levothyroxine (SYNTHROID) 88 MCG tablet TAKE 1 TABLET BY MOUTH DAILY. 30 tablet 0  . metoprolol succinate (TOPROL-XL) 25 MG 24 hr tablet TAKE (1/2) TABLET DAILY. 15 tablet 5  . mupirocin ointment (BACTROBAN) 2 % Apply 1 application topically 2 (two) times daily. (Patient taking differently: Apply 1 application topically 2 (two) times daily. ) 22 g 0  . nitroGLYCERIN (NITROSTAT) 0.4 MG SL tablet Place 1 tablet (0.4 mg total) under the tongue every 5 (five) minutes as needed for chest pain (CP or SOB). 25 tablet 12  . ramipril (ALTACE) 1.25 MG capsule Take 1 capsule (1.25 mg total) by mouth daily. 30 capsule 3  . tamsulosin (FLOMAX) 0.4 MG CAPS capsule TAKE (1) CAPSULE DAILY. (Patient taking differently: TAKE (2) CAPSULES  DAILY AFTER  SUPPER) 30 capsule 3  . venlafaxine XR (EFFEXOR-XR) 37.5 MG 24 hr capsule Take 1 capsule (37.5 mg total) by mouth daily. 30 capsule 6   Current Facility-Administered Medications  Medication Dose Route Frequency Provider Last Rate Last Dose  . testosterone cypionate (DEPOTESTOSTERONE CYPIONATE) injection 200 mg  200 mg Intramuscular Q14 Days Elayne Snare, MD   200 mg at 04/28/18 1529  . testosterone cypionate (DEPOTESTOSTERONE CYPIONATE) injection 200 mg  200 mg Intramuscular Q14 Days Janith Lima, MD   200 mg at 09/28/18 0840    Lab Results:  No results found for this or any previous visit (from the past 86 hour(s)).  Physical Findings: AIMS:  , ,  ,  ,    CIWA:    COWS:     Musculoskeletal: Strength & Muscle Tone: within normal limits Gait & Station: normal Patient leans: Right  Psychiatric Specialty Exam: ROS  There were no vitals taken for this visit.There is no height or weight on file to calculate BMI.  General Appearance: Negative  Eye Contact::  Good  Speech:  Clear and Coherent  Volume:  Normal  Mood:  NA  Affect:  Appropriate  Thought Process:  Coherent  Orientation:  Full (Time, Place, and Person)  Thought Content:  WDL  Suicidal Thoughts:  No  Homicidal Thoughts:  No  Memory:  NA  Judgement:  Good  Insight:  Good  Psychomotor Activity:  Normal  Concentration:  Good  Recall:  Good  Fund of Knowledge:Good  Language: Good  Akathisia:  No  Handed:  Right  AIMS (if indicated):     Assets:  Desire for Improvement  ADL's:  Intact  Cognition: WNL  Sleep:      Treatment Plan Summary: This patient has 2 stable problems.  His first problem is that of clinical depression.  He takes Effexor and does very well.  He also takes a small dose of imipramine at night.  His second problem is that of insomnia.  He takes melatonin and at times he takes Restoril 15 mg.  Today he is assess to slightly increase his Xanax.  We will go ahead and increase it to a dose of 0.25 mg  taking 2 at night.  Patient is no longer drinking.  He is functioning very well.  He will return to see me in 3 months.

## 2019-04-14 ENCOUNTER — Encounter: Payer: Self-pay | Admitting: Internal Medicine

## 2019-04-14 ENCOUNTER — Other Ambulatory Visit (INDEPENDENT_AMBULATORY_CARE_PROVIDER_SITE_OTHER): Payer: Medicare Other

## 2019-04-14 ENCOUNTER — Other Ambulatory Visit: Payer: Self-pay

## 2019-04-14 ENCOUNTER — Ambulatory Visit (INDEPENDENT_AMBULATORY_CARE_PROVIDER_SITE_OTHER): Payer: Medicare Other | Admitting: Internal Medicine

## 2019-04-14 VITALS — BP 108/54 | HR 68 | Temp 97.9°F | Resp 16 | Ht 67.5 in | Wt 184.0 lb

## 2019-04-14 DIAGNOSIS — R410 Disorientation, unspecified: Secondary | ICD-10-CM

## 2019-04-14 DIAGNOSIS — R531 Weakness: Secondary | ICD-10-CM

## 2019-04-14 DIAGNOSIS — E039 Hypothyroidism, unspecified: Secondary | ICD-10-CM

## 2019-04-14 DIAGNOSIS — R739 Hyperglycemia, unspecified: Secondary | ICD-10-CM

## 2019-04-14 DIAGNOSIS — R350 Frequency of micturition: Secondary | ICD-10-CM

## 2019-04-14 DIAGNOSIS — R413 Other amnesia: Secondary | ICD-10-CM | POA: Diagnosis not present

## 2019-04-14 DIAGNOSIS — R2689 Other abnormalities of gait and mobility: Secondary | ICD-10-CM | POA: Diagnosis not present

## 2019-04-14 DIAGNOSIS — R29818 Other symptoms and signs involving the nervous system: Secondary | ICD-10-CM

## 2019-04-14 LAB — CBC WITH DIFFERENTIAL/PLATELET
Basophils Absolute: 0.1 10*3/uL (ref 0.0–0.1)
Basophils Relative: 0.8 % (ref 0.0–3.0)
Eosinophils Absolute: 0.3 10*3/uL (ref 0.0–0.7)
Eosinophils Relative: 3.9 % (ref 0.0–5.0)
HCT: 33.5 % — ABNORMAL LOW (ref 39.0–52.0)
Hemoglobin: 11.2 g/dL — ABNORMAL LOW (ref 13.0–17.0)
Lymphocytes Relative: 23.4 % (ref 12.0–46.0)
Lymphs Abs: 1.7 10*3/uL (ref 0.7–4.0)
MCHC: 33.5 g/dL (ref 30.0–36.0)
MCV: 95.5 fl (ref 78.0–100.0)
Monocytes Absolute: 0.6 10*3/uL (ref 0.1–1.0)
Monocytes Relative: 8.8 % (ref 3.0–12.0)
Neutro Abs: 4.5 10*3/uL (ref 1.4–7.7)
Neutrophils Relative %: 63.1 % (ref 43.0–77.0)
Platelets: 229 10*3/uL (ref 150.0–400.0)
RBC: 3.51 Mil/uL — ABNORMAL LOW (ref 4.22–5.81)
RDW: 13.5 % (ref 11.5–15.5)
WBC: 7.2 10*3/uL (ref 4.0–10.5)

## 2019-04-14 LAB — COMPREHENSIVE METABOLIC PANEL
ALT: 17 U/L (ref 0–53)
AST: 17 U/L (ref 0–37)
Albumin: 4.2 g/dL (ref 3.5–5.2)
Alkaline Phosphatase: 62 U/L (ref 39–117)
BUN: 30 mg/dL — ABNORMAL HIGH (ref 6–23)
CO2: 30 mEq/L (ref 19–32)
Calcium: 9.2 mg/dL (ref 8.4–10.5)
Chloride: 103 mEq/L (ref 96–112)
Creatinine, Ser: 1.31 mg/dL (ref 0.40–1.50)
GFR: 52.1 mL/min — ABNORMAL LOW (ref >60.00)
Glucose, Bld: 107 mg/dL — ABNORMAL HIGH (ref 70–99)
Potassium: 4.3 mEq/L (ref 3.5–5.1)
Sodium: 139 mEq/L (ref 135–145)
Total Bilirubin: 0.3 mg/dL (ref 0.2–1.2)
Total Protein: 6.5 g/dL (ref 6.0–8.3)

## 2019-04-14 LAB — URINALYSIS, ROUTINE W REFLEX MICROSCOPIC
Bilirubin Urine: NEGATIVE
Hgb urine dipstick: NEGATIVE
Ketones, ur: NEGATIVE
Leukocytes,Ua: NEGATIVE
Nitrite: NEGATIVE
RBC / HPF: NONE SEEN (ref 0–?)
Specific Gravity, Urine: 1.02 (ref 1.000–1.030)
Total Protein, Urine: NEGATIVE
Urine Glucose: NEGATIVE
Urobilinogen, UA: 0.2 (ref 0.0–1.0)
pH: 6 (ref 5.0–8.0)

## 2019-04-14 LAB — VITAMIN B12: Vitamin B-12: 335 pg/mL (ref 211–911)

## 2019-04-14 LAB — SEDIMENTATION RATE: Sed Rate: 8 mm/hr (ref 0–20)

## 2019-04-14 LAB — TSH: TSH: 2.13 u[IU]/mL (ref 0.35–4.50)

## 2019-04-14 LAB — HEMOGLOBIN A1C: Hgb A1c MFr Bld: 5.5 % (ref 4.6–6.5)

## 2019-04-14 NOTE — Progress Notes (Signed)
Subjective:    Patient ID: [name], male    DOB: [birth year], 85 y.o.   MRN: [id number]  HPI The patient is here for an acute visit.  He is here with his daughter.  His symptoms of concern started approximately 1 month ago prior to his symptoms starting, in June, he did have a couple of falls, but both were tripping episodes.  One time he tripped over his shoelace and the other time he tripped going up stairs-he did break a couple of ribs with 1 of those falls.  Over the past several months he has been less active than usual-typically he would go to the gym on a regular basis, but he has not been doing that since the [place] pandemic started.  Over the last month or so he is developed significant weakness in both arms and legs.  He has had some falls, but they were different than his tripping episodes.  One time he went to go sit in his chair and missed the chair and fell over.  He did not have the strength to move or get up after the fall.  That is unusual for him.  Another episode he was in bed and had difficulty sitting up and needed help.  If he does fall he is not able to get up on his own.  He has been using a walker because he is more unstable.  He now has a shuffling gait, which is new.  One night he was going from bed to the walker to go to the bathroom and he just slid down on the floor and he was not able to get up.  His arms and his legs seem to have no more strength.  He denies any focal weakness.  He denies any numbness or tingling in his arms or legs.  Last night sittting in chair and he kept kept sliding off and could not get up.  Him and his daughter have noticed changes in his memory as well.  There has been some episodes of difficulty focusing.  A few episodes of not focusing.  There has been a change in the last few weeks.  He states fatigue and occasional dizziness, both of which are new.  Saw urology two days ago.  He did have urinary retention, which is not  completely new.  He was unsure if it was worse.  Yesterday he saw Dr. Casimiro Needle.  He did discuss some of the symptoms.  He did decrease his imipramine and increased his Wellbutrin.  The medications he has been on he has been on for a very long time and are not new.    Medications and allergies reviewed with patient and updated if appropriate.  Patient Active Problem List   Diagnosis Date Noted  . Mild intermittent asthma with acute exacerbation 02/08/2018  . Hypothyroidism 03/14/2013  . Hyperlipidemia LDL goal <70 03/14/2013  . BPH (benign prostatic hypertrophy) 03/14/2013  . Hypogonadism, male 03/14/2013  . Erectile dysfunction 03/14/2013    Current Outpatient Medications on File Prior to Visit  Medication Sig Dispense Refill  . acetaminophen (TYLENOL) 500 MG tablet Take 500 mg by mouth daily as needed (pain).    [name] ALPRAZolam (XANAX) 0.25 MG tablet 2  qhs 60 tablet 0  . aspirin 81 MG chewable tablet Chew 1 tablet (81 mg total) by mouth daily. 30 tablet 3  . atorvastatin (LIPITOR) 40 MG tablet Take 1 tablet (40 mg total) by mouth daily at 6 PM.  30 tablet 3  . buPROPion (WELLBUTRIN XL) 150 MG 24 hr tablet Take 1 tablet (150 mg total) by mouth daily. 2  qam 30 tablet 2  . clobetasol cream (TEMOVATE) 0.05 % APPLY AS DIRECTED. 30 g 0  . imipramine (TOFRANIL) 10 MG tablet 1 q hs 30 tablet 5  . levothyroxine (SYNTHROID) 88 MCG tablet TAKE 1 TABLET BY MOUTH DAILY. 30 tablet 0  . metoprolol succinate (TOPROL-XL) 25 MG 24 hr tablet TAKE (1/2) TABLET DAILY. 15 tablet 5  . mupirocin ointment (BACTROBAN) 2 % Apply 1 application topically 2 (two) times daily. (Patient taking differently: Apply 1 application topically 2 (two) times daily. ) 22 g 0  . nitroGLYCERIN (NITROSTAT) 0.4 MG SL tablet Place 1 tablet (0.4 mg total) under the tongue every 5 (five) minutes as needed for chest pain (CP or SOB). 25 tablet 12  . ramipril (ALTACE) 1.25 MG capsule Take 1 capsule (1.25 mg total) by mouth daily. 30  capsule 3  . tamsulosin (FLOMAX) 0.4 MG CAPS capsule TAKE (1) CAPSULE DAILY. (Patient taking differently: TAKE (2) CAPSULES  DAILY AFTER SUPPER) 30 capsule 3  . venlafaxine XR (EFFEXOR-XR) 37.5 MG 24 hr capsule Take 1 capsule (37.5 mg total) by mouth daily. 30 capsule 6   Current Facility-Administered Medications on File Prior to Visit  Medication Dose Route Frequency Provider Last Rate Last Dose  . testosterone cypionate (DEPOTESTOSTERONE CYPIONATE) injection 200 mg  200 mg Intramuscular Q14 Days Elayne Snare, MD   200 mg at 04/28/18 1529  . testosterone cypionate (DEPOTESTOSTERONE CYPIONATE) injection 200 mg  200 mg Intramuscular Q14 Days Janith Lima, MD   200 mg at 09/28/18 0840    Past Medical History:  Diagnosis Date  . Anxiety   . Arthritis    might be in back, no problems  . BPH (benign prostatic hyperplasia)   . Depression 1990s  . GERD (gastroesophageal reflux disease)    occasional  . Hypercholesteremia    controled  . Hypothyroid   . Shingles May 2014   "Right face, still has some"  . Temporal arteritis ([place])   . Transient blindness of both eyes     Past Surgical History:  Procedure Laterality Date  . ARTERY BIOPSY Right 06/20/2013   Procedure: BIOPSY TEMPORAL ARTERY RIGHT;  Surgeon: Earnstine Regal, MD;  Location: [facility];  Service: General;  Laterality: Right;  . CARDIAC CATHETERIZATION N/A 09/02/2015   Procedure: Left Heart Cath and Coronary Angiography;  Surgeon: Charolette Forward, MD;  Location: [facility];  Service: Cardiovascular;  Laterality: N/A;  . CARDIAC CATHETERIZATION N/A 09/02/2015   Procedure: Coronary Stent Intervention;  Surgeon: Charolette Forward, MD;  Location: [facility];  Service: Cardiovascular;  Laterality: N/A;  1.  mid RCA      (3.0/28mm Xience) 2.  Mid LAD      (3.0/23mm Xience)  . NO PAST SURGERIES    . None      Social History   Socioeconomic History  . Marital status: Married    Spouse name: Not on file  . Number of  children: Not on file  . Years of education: Not on file  . Highest education level: Not on file  Occupational History  . Not on file  Social Needs  . Financial resource strain: Not on file  . Food insecurity    Worry: Not on file    Inability: Not on file  . Transportation needs    Medical: Not on file  Non-medical: Not on file  Tobacco Use  . Smoking status: Former Smoker    Types: Cigarettes    Quit date: 08/24/1966    Years since quitting: 52.6  . Smokeless tobacco: Never Used  Substance and Sexual Activity  . Alcohol use: Yes    Alcohol/week: 2.0 standard drinks    Types: 2 Shots of liquor per week    Comment: socially  . Drug use: No  . Sexual activity: Not on file  Lifestyle  . Physical activity    Days per week: Not on file    Minutes per session: Not on file  . Stress: Not on file  Relationships  . Social Herbalist on phone: Not on file    Gets together: Not on file    Attends religious service: Not on file    Active member of club or organization: Not on file    Attends meetings of clubs or organizations: Not on file    Relationship status: Not on file  Other Topics Concern  . Not on file  Social History Narrative   Currently works as a Midwife.  Lives with wife and they have two healthy daughters.    Family History  Problem Relation Age of Onset  . Stroke Father        Died, 80s  . Stroke Sister        Living, 49  . Heart disease Mother        Died, 56  . Healthy Daughter   . Diabetes Maternal Grandmother   . Hypertension Neg Hx     Review of Systems  Constitutional: Positive for fatigue. Negative for appetite change, chills and fever.  HENT: Positive for trouble swallowing (Chronic problem-trouble swallowing food, choking.  No recent change).   Eyes: Negative for visual disturbance.  Respiratory: Negative for cough, shortness of breath and wheezing.   Cardiovascular: Positive for chest pain (occasional - saw cardio). Negative  for palpitations and leg swelling.  Gastrointestinal: Negative for abdominal pain, blood in stool, constipation, diarrhea and nausea.  Genitourinary: Positive for difficulty urinating, frequency (more at night), hematuria and urgency. Negative for dysuria.  Musculoskeletal: Negative for arthralgias, back pain and myalgias.  Skin: Negative for color change and rash.  Neurological: Positive for dizziness (occasional, new), weakness (both arms and legs) and headaches (occasional). Negative for speech difficulty, light-headedness and numbness.  Psychiatric/Behavioral: Positive for confusion and decreased concentration. Negative for agitation.       Memory issues new       Objective:   Vitals:   04/14/19 1428  BP: (!) 108/54  Pulse: 68  Resp: 16  Temp: 97.9 F (36.6 C)  SpO2: 96%   BP Readings from Last 3 Encounters:  04/14/19 (!) 108/54  10/24/18 108/62  08/30/18 130/68   Wt Readings from Last 3 Encounters:  04/14/19 184 lb (83.5 kg)  07/28/18 188 lb 12 oz (85.6 kg)  07/08/18 190 lb (86.2 kg)   Body mass index is 28.39 kg/m.   Physical Exam Constitutional:      General: He is not in acute distress.    Appearance: Normal appearance. He is not ill-appearing, toxic-appearing or diaphoretic.  HENT:     Head: Normocephalic and atraumatic.  Neck:     Musculoskeletal: No neck rigidity or muscular tenderness.     Vascular: No carotid bruit.  Cardiovascular:     Rate and Rhythm: Normal rate and regular rhythm.     Heart sounds: No murmur.  Pulmonary:     Effort: Pulmonary effort is normal. No respiratory distress.     Breath sounds: No wheezing or rales.  Abdominal:     General: There is no distension.     Palpations: Abdomen is soft.     Tenderness: There is no abdominal tenderness.  Musculoskeletal:     Right lower leg: No edema.     Left lower leg: No edema.  Lymphadenopathy:     Cervical: No cervical adenopathy.  Skin:    General: Skin is warm and dry.   Neurological:     General: No focal deficit present.     Mental Status: He is alert and oriented to person, place, and time.     Cranial Nerves: No cranial nerve deficit.     Sensory: No sensory deficit.     Motor: Weakness (Bilateral proximal upper extremity weakness, bilateral lower extremity weakness-all equal, 4/5) present.     Gait: Gait abnormal (Shuffling gait, poor balance).  Psychiatric:        Mood and Affect: Mood normal.        Behavior: Behavior normal.            Assessment & Plan:    See Problem List for Assessment and Plan of chronic medical problems.

## 2019-04-14 NOTE — Patient Instructions (Addendum)
  Tests ordered today. Your results will be released to [facility] (or called to you) after review.  If any changes need to be made, you will be notified at that same time.  An MRI was ordered.   Medications reviewed and updated.  Changes include :   none    A referral was ordered for neurology

## 2019-04-15 DIAGNOSIS — R413 Other amnesia: Secondary | ICD-10-CM | POA: Insufficient documentation

## 2019-04-15 DIAGNOSIS — R410 Disorientation, unspecified: Secondary | ICD-10-CM | POA: Insufficient documentation

## 2019-04-15 DIAGNOSIS — R350 Frequency of micturition: Secondary | ICD-10-CM | POA: Insufficient documentation

## 2019-04-15 DIAGNOSIS — R2689 Other abnormalities of gait and mobility: Secondary | ICD-10-CM | POA: Insufficient documentation

## 2019-04-15 DIAGNOSIS — R29818 Other symptoms and signs involving the nervous system: Secondary | ICD-10-CM | POA: Insufficient documentation

## 2019-04-15 DIAGNOSIS — R739 Hyperglycemia, unspecified: Secondary | ICD-10-CM | POA: Insufficient documentation

## 2019-04-15 NOTE — Assessment & Plan Note (Signed)
Changes in memory and some confusion over the last month or so Neurological work-up as above Refer to neurology, MRI of head Blood work

## 2019-04-15 NOTE — Assessment & Plan Note (Signed)
Experiencing generalized weakness of all extremities-nonfocal Also changes in memory, decreased concentration, shuffling gait,?  Change in urinary retention  ?  NPH ?  Hyponatremia or other metabolic abnormality Unlikely stroke given exam is nonfocal ?  Related to medications, but there have been no changes Infection seems less likely Will refer to neurology MRI of head Blood work is ordered

## 2019-04-15 NOTE — Assessment & Plan Note (Signed)
History of elevated sugar Unlikely to be because of most symptoms Check A1c

## 2019-04-15 NOTE — Assessment & Plan Note (Signed)
Has had increased fatigue Check TSH

## 2019-04-15 NOTE — Assessment & Plan Note (Signed)
Neurological work-up as above Refer to neurology, MRI of head Blood work

## 2019-04-15 NOTE — Assessment & Plan Note (Addendum)
Confusion, memory changes, gait changes, weakness Neurological work-up as stated Refer to neurology, MRI of head Blood work

## 2019-04-15 NOTE — Assessment & Plan Note (Signed)
Recently saw urology-no evidence of infection on that day, but did have urinary retention-?  Worsen retention ?  UTI-urinalysis, urine culture ?  NPH MRI of brain

## 2019-04-16 ENCOUNTER — Encounter: Payer: Self-pay | Admitting: Internal Medicine

## 2019-04-17 ENCOUNTER — Other Ambulatory Visit: Payer: Self-pay

## 2019-04-17 ENCOUNTER — Telehealth: Payer: Self-pay | Admitting: *Deleted

## 2019-04-17 ENCOUNTER — Telehealth: Payer: Self-pay | Admitting: Internal Medicine

## 2019-04-17 ENCOUNTER — Inpatient Hospital Stay (HOSPITAL_COMMUNITY)
Admission: EM | Admit: 2019-04-17 | Discharge: 2019-04-21 | DRG: 026 | Disposition: A | Discharge status: Rehabilitation Facility | Payer: Medicare Other | Source: Ambulatory Visit | Attending: Neurosurgery | Admitting: Neurosurgery

## 2019-04-17 ENCOUNTER — Ambulatory Visit: Payer: Medicare Other | Admitting: Internal Medicine

## 2019-04-17 ENCOUNTER — Ambulatory Visit
Admission: RE | Admit: 2019-04-17 | Discharge: 2019-04-17 | Disposition: A | Discharge status: Home / Self Care | Payer: Medicare Other | Source: Ambulatory Visit | Attending: Internal Medicine | Admitting: Internal Medicine

## 2019-04-17 DIAGNOSIS — I6202 Nontraumatic subacute subdural hemorrhage: Secondary | ICD-10-CM | POA: Diagnosis not present

## 2019-04-17 DIAGNOSIS — Y929 Unspecified place or not applicable: Secondary | ICD-10-CM | POA: Diagnosis not present

## 2019-04-17 DIAGNOSIS — Z7982 Long term (current) use of aspirin: Secondary | ICD-10-CM

## 2019-04-17 DIAGNOSIS — Z9181 History of falling: Secondary | ICD-10-CM

## 2019-04-17 DIAGNOSIS — R339 Retention of urine, unspecified: Secondary | ICD-10-CM | POA: Diagnosis not present

## 2019-04-17 DIAGNOSIS — M316 Other giant cell arteritis: Secondary | ICD-10-CM | POA: Diagnosis present

## 2019-04-17 DIAGNOSIS — E7800 Pure hypercholesterolemia, unspecified: Secondary | ICD-10-CM | POA: Diagnosis present

## 2019-04-17 DIAGNOSIS — Z03818 Encounter for observation for suspected exposure to other biological agents ruled out: Secondary | ICD-10-CM | POA: Diagnosis not present

## 2019-04-17 DIAGNOSIS — Z79890 Hormone replacement therapy (postmenopausal): Secondary | ICD-10-CM | POA: Diagnosis not present

## 2019-04-17 DIAGNOSIS — S065X9A Traumatic subdural hemorrhage with loss of consciousness of unspecified duration, initial encounter: Principal | ICD-10-CM | POA: Diagnosis present

## 2019-04-17 DIAGNOSIS — R338 Other retention of urine: Secondary | ICD-10-CM | POA: Diagnosis present

## 2019-04-17 DIAGNOSIS — Z79891 Long term (current) use of opiate analgesic: Secondary | ICD-10-CM | POA: Diagnosis not present

## 2019-04-17 DIAGNOSIS — W19XXXA Unspecified fall, initial encounter: Secondary | ICD-10-CM | POA: Diagnosis present

## 2019-04-17 DIAGNOSIS — S065X0A Traumatic subdural hemorrhage without loss of consciousness, initial encounter: Secondary | ICD-10-CM | POA: Diagnosis not present

## 2019-04-17 DIAGNOSIS — Z823 Family history of stroke: Secondary | ICD-10-CM

## 2019-04-17 DIAGNOSIS — R29700 NIHSS score 0: Secondary | ICD-10-CM | POA: Diagnosis not present

## 2019-04-17 DIAGNOSIS — K5900 Constipation, unspecified: Secondary | ICD-10-CM | POA: Diagnosis present

## 2019-04-17 DIAGNOSIS — E291 Testicular hypofunction: Secondary | ICD-10-CM | POA: Diagnosis not present

## 2019-04-17 DIAGNOSIS — Z888 Allergy status to other drugs, medicaments and biological substances status: Secondary | ICD-10-CM

## 2019-04-17 DIAGNOSIS — Z8249 Family history of ischemic heart disease and other diseases of the circulatory system: Secondary | ICD-10-CM | POA: Diagnosis not present

## 2019-04-17 DIAGNOSIS — S065X0S Traumatic subdural hemorrhage without loss of consciousness, sequela: Secondary | ICD-10-CM | POA: Diagnosis not present

## 2019-04-17 DIAGNOSIS — R410 Disorientation, unspecified: Secondary | ICD-10-CM | POA: Diagnosis not present

## 2019-04-17 DIAGNOSIS — K219 Gastro-esophageal reflux disease without esophagitis: Secondary | ICD-10-CM | POA: Diagnosis present

## 2019-04-17 DIAGNOSIS — R296 Repeated falls: Secondary | ICD-10-CM | POA: Diagnosis present

## 2019-04-17 DIAGNOSIS — Z79899 Other long term (current) drug therapy: Secondary | ICD-10-CM | POA: Diagnosis not present

## 2019-04-17 DIAGNOSIS — S065X9S Traumatic subdural hemorrhage with loss of consciousness of unspecified duration, sequela: Secondary | ICD-10-CM | POA: Diagnosis present

## 2019-04-17 DIAGNOSIS — Z20828 Contact with and (suspected) exposure to other viral communicable diseases: Secondary | ICD-10-CM | POA: Diagnosis not present

## 2019-04-17 DIAGNOSIS — H53123 Transient visual loss, bilateral: Secondary | ICD-10-CM | POA: Diagnosis present

## 2019-04-17 DIAGNOSIS — E039 Hypothyroidism, unspecified: Secondary | ICD-10-CM | POA: Diagnosis present

## 2019-04-17 DIAGNOSIS — Z833 Family history of diabetes mellitus: Secondary | ICD-10-CM | POA: Diagnosis not present

## 2019-04-17 DIAGNOSIS — F329 Major depressive disorder, single episode, unspecified: Secondary | ICD-10-CM | POA: Diagnosis present

## 2019-04-17 DIAGNOSIS — G8194 Hemiplegia, unspecified affecting left nondominant side: Secondary | ICD-10-CM | POA: Diagnosis not present

## 2019-04-17 DIAGNOSIS — I10 Essential (primary) hypertension: Secondary | ICD-10-CM | POA: Diagnosis present

## 2019-04-17 DIAGNOSIS — N401 Enlarged prostate with lower urinary tract symptoms: Secondary | ICD-10-CM | POA: Diagnosis present

## 2019-04-17 DIAGNOSIS — E785 Hyperlipidemia, unspecified: Secondary | ICD-10-CM | POA: Diagnosis not present

## 2019-04-17 DIAGNOSIS — S065X0D Traumatic subdural hemorrhage without loss of consciousness, subsequent encounter: Secondary | ICD-10-CM | POA: Diagnosis not present

## 2019-04-17 DIAGNOSIS — S065XAA Traumatic subdural hemorrhage with loss of consciousness status unknown, initial encounter: Secondary | ICD-10-CM | POA: Diagnosis present

## 2019-04-17 DIAGNOSIS — I6200 Nontraumatic subdural hemorrhage, unspecified: Secondary | ICD-10-CM | POA: Diagnosis not present

## 2019-04-17 DIAGNOSIS — R29818 Other symptoms and signs involving the nervous system: Secondary | ICD-10-CM

## 2019-04-17 DIAGNOSIS — I6203 Nontraumatic chronic subdural hemorrhage: Secondary | ICD-10-CM | POA: Diagnosis not present

## 2019-04-17 DIAGNOSIS — Z1159 Encounter for screening for other viral diseases: Secondary | ICD-10-CM | POA: Diagnosis not present

## 2019-04-17 DIAGNOSIS — F419 Anxiety disorder, unspecified: Secondary | ICD-10-CM | POA: Diagnosis not present

## 2019-04-17 DIAGNOSIS — Z955 Presence of coronary angioplasty implant and graft: Secondary | ICD-10-CM | POA: Diagnosis not present

## 2019-04-17 DIAGNOSIS — S065X2S Traumatic subdural hemorrhage with loss of consciousness of 31 minutes to 59 minutes, sequela: Secondary | ICD-10-CM | POA: Diagnosis not present

## 2019-04-17 DIAGNOSIS — Z87891 Personal history of nicotine dependence: Secondary | ICD-10-CM

## 2019-04-17 DIAGNOSIS — W228XXD Striking against or struck by other objects, subsequent encounter: Secondary | ICD-10-CM | POA: Diagnosis not present

## 2019-04-17 DIAGNOSIS — F418 Other specified anxiety disorders: Secondary | ICD-10-CM | POA: Diagnosis not present

## 2019-04-17 DIAGNOSIS — Z23 Encounter for immunization: Secondary | ICD-10-CM | POA: Diagnosis not present

## 2019-04-17 LAB — BASIC METABOLIC PANEL
Anion gap: 10 (ref 5–15)
BUN: 26 mg/dL — ABNORMAL HIGH (ref 8–23)
CO2: 27 mmol/L (ref 22–32)
Calcium: 9.1 mg/dL (ref 8.9–10.3)
Chloride: 99 mmol/L (ref 98–111)
Creatinine, Ser: 1.22 mg/dL (ref 0.61–1.24)
GFR calc Af Amer: >60 mL/min (ref >60)
GFR calc non Af Amer: 54 mL/min — ABNORMAL LOW (ref >60)
Glucose, Bld: 96 mg/dL (ref 70–99)
Potassium: 4.3 mmol/L (ref 3.5–5.1)
Sodium: 136 mmol/L (ref 135–145)

## 2019-04-17 LAB — URINE CULTURE
MICRO NUMBER:: 798030
SPECIMEN QUALITY:: ADEQUATE

## 2019-04-17 LAB — PROTIME-INR
INR: 1.1 (ref 0.8–1.2)
Prothrombin Time: 13.8 seconds (ref 11.4–15.2)

## 2019-04-17 LAB — CBC WITH DIFFERENTIAL/PLATELET
Abs Immature Granulocytes: 0.07 10*3/uL (ref 0.00–0.07)
Basophils Absolute: 0.1 10*3/uL (ref 0.0–0.1)
Basophils Relative: 1 %
Eosinophils Absolute: 0.3 10*3/uL (ref 0.0–0.5)
Eosinophils Relative: 4 %
HCT: 34.6 % — ABNORMAL LOW (ref 39.0–52.0)
Hemoglobin: 11 g/dL — ABNORMAL LOW (ref 13.0–17.0)
Immature Granulocytes: 1 %
Lymphocytes Relative: 27 %
Lymphs Abs: 1.9 10*3/uL (ref 0.7–4.0)
MCH: 31.3 pg (ref 26.0–34.0)
MCHC: 31.8 g/dL (ref 30.0–36.0)
MCV: 98.3 fL (ref 80.0–100.0)
Monocytes Absolute: 0.6 10*3/uL (ref 0.1–1.0)
Monocytes Relative: 8 %
Neutro Abs: 4.4 10*3/uL (ref 1.7–7.7)
Neutrophils Relative %: 59 %
Platelets: 232 10*3/uL (ref 150–400)
RBC: 3.52 MIL/uL — ABNORMAL LOW (ref 4.22–5.81)
RDW: 12.8 % (ref 11.5–15.5)
WBC: 7.2 10*3/uL (ref 4.0–10.5)
nRBC: 0 % (ref 0.0–0.2)

## 2019-04-17 LAB — CYCLIC CITRUL PEPTIDE ANTIBODY, IGG: Cyclic Citrullin Peptide Ab: <16 UNITS

## 2019-04-17 MED ORDER — ALPRAZOLAM 0.5 MG PO TABS
0.5000 mg | ORAL_TABLET | Freq: Every evening | ORAL | Status: DC | PRN
  Administered 2019-04-17 – 2019-04-20 (×4): 0.5 mg via ORAL
  Filled 2019-04-17 (×4): qty 1

## 2019-04-17 MED ORDER — SODIUM CHLORIDE 0.9% FLUSH
3.0000 mL | Freq: Two times a day (BID) | INTRAVENOUS | Status: DC
  Administered 2019-04-17 – 2019-04-21 (×8): 3 mL via INTRAVENOUS

## 2019-04-17 MED ORDER — RAMIPRIL 1.25 MG PO CAPS
1.2500 mg | ORAL_CAPSULE | Freq: Every day | ORAL | Status: DC
  Administered 2019-04-17 – 2019-04-20 (×3): 1.25 mg via ORAL
  Filled 2019-04-17 (×5): qty 1

## 2019-04-17 MED ORDER — VENLAFAXINE HCL ER 37.5 MG PO CP24
37.5000 mg | ORAL_CAPSULE | Freq: Every day | ORAL | Status: DC
  Administered 2019-04-18 – 2019-04-21 (×3): 37.5 mg via ORAL
  Filled 2019-04-17 (×4): qty 1

## 2019-04-17 MED ORDER — ATORVASTATIN CALCIUM 40 MG PO TABS
40.0000 mg | ORAL_TABLET | Freq: Every day | ORAL | Status: DC
  Administered 2019-04-18 – 2019-04-20 (×2): 40 mg via ORAL
  Filled 2019-04-17 (×3): qty 1

## 2019-04-17 MED ORDER — METOPROLOL SUCCINATE ER 25 MG PO TB24: 12.5000 mg | ORAL_TABLET | Freq: Every day | ORAL | Status: DC

## 2019-04-17 MED ORDER — METOPROLOL SUCCINATE ER 25 MG PO TB24
12.5000 mg | ORAL_TABLET | Freq: Every day | ORAL | Status: DC
  Administered 2019-04-18 – 2019-04-20 (×3): 12.5 mg via ORAL
  Filled 2019-04-17 (×3): qty 1

## 2019-04-17 MED ORDER — IMIPRAMINE HCL 10 MG PO TABS
10.0000 mg | ORAL_TABLET | Freq: Every day | ORAL | Status: DC
  Administered 2019-04-17 – 2019-04-20 (×4): 10 mg via ORAL
  Filled 2019-04-17 (×7): qty 1

## 2019-04-17 MED ORDER — LEVOTHYROXINE SODIUM 88 MCG PO TABS: 88.0000 ug | ORAL_TABLET | Freq: Every day | ORAL | Status: DC

## 2019-04-17 MED ORDER — TAMSULOSIN HCL 0.4 MG PO CAPS
0.8000 mg | ORAL_CAPSULE | Freq: Every day | ORAL | Status: DC
  Administered 2019-04-18 – 2019-04-20 (×2): 0.8 mg via ORAL
  Filled 2019-04-17 (×3): qty 2

## 2019-04-17 MED ORDER — NITROGLYCERIN 0.4 MG SL SUBL: 0.4000 mg | SUBLINGUAL_TABLET | SUBLINGUAL | Status: DC | PRN

## 2019-04-17 MED ORDER — GADOBENATE DIMEGLUMINE 529 MG/ML IV SOLN
17.0000 mL | Freq: Once | INTRAVENOUS | Status: AC | PRN
  Administered 2019-04-17: 17 mL via INTRAVENOUS

## 2019-04-17 MED ORDER — SODIUM CHLORIDE 0.9 % IV SOLN: 250.0000 mL | INTRAVENOUS | Status: DC | PRN

## 2019-04-17 MED ORDER — CLOBETASOL PROPIONATE 0.05 % EX CREA
TOPICAL_CREAM | Freq: Every day | CUTANEOUS | Status: DC | PRN
  Filled 2019-04-17: qty 15

## 2019-04-17 MED ORDER — SODIUM CHLORIDE 0.9% FLUSH: 3.0000 mL | INTRAVENOUS | Status: DC | PRN

## 2019-04-17 MED ORDER — LEVOTHYROXINE SODIUM 88 MCG PO TABS
88.0000 ug | ORAL_TABLET | Freq: Every day | ORAL | Status: DC
  Administered 2019-04-19 – 2019-04-21 (×3): 88 ug via ORAL
  Filled 2019-04-17 (×3): qty 1

## 2019-04-17 MED ORDER — BUPROPION HCL ER (XL) 150 MG PO TB24
150.0000 mg | ORAL_TABLET | Freq: Every day | ORAL | Status: DC
  Administered 2019-04-17 – 2019-04-21 (×4): 150 mg via ORAL
  Filled 2019-04-17 (×5): qty 1

## 2019-04-17 NOTE — ED Notes (Signed)
Paged admitting provider to inform them of issues w/ swallowing.

## 2019-04-17 NOTE — ED Notes (Signed)
ED TO INPATIENT HANDOFF REPORT  ED Nurse Name and Phone #: Kathie Rhodes RN  S Name/Age/Gender Benjamin Hahn 85 y.o. male Room/Bed: TRAAC/TRAAC  Code Status   Code Status: Full Code  Home/SNF/Other Home Patient oriented to: self, place, time and situation Is this baseline? Yes   Triage Complete: Triage complete  Chief Complaint Loss of balance, brain bleed  Triage Note Pt here from PCP for neurosurgery consult. Pt has had multiple falls, leading to MRI today. 2 cm hematoma present and 7 mm bleed with shift.    Allergies Allergies  Allergen Reactions  . Lorazepam     Level of Care/Admitting Diagnosis ED Disposition    ED Disposition Condition [facility]: [place] [[id number]]  Level of Care: Progressive [102]  Covid Evaluation: Asymptomatic Screening Protocol (No Symptoms)  Diagnosis: Subdural hematoma [facility]) [id number]  Admitting Physician: Newman Pies [[id number]]  Attending Physician: Jovita Gamma [[id number]]  Estimated length of stay: 3 - 4 days  Certification:: I certify this patient will need inpatient services for at least 2 midnights  PT Class (Do Not Modify): Inpatient [101]  PT Acc Code (Do Not Modify): Private [1]       B Medical/Surgery History Past Medical History:  Diagnosis Date  . Anxiety   . Arthritis    might be in back, no problems  . BPH (benign prostatic hyperplasia)   . Depression 1990s  . GERD (gastroesophageal reflux disease)    occasional  . Hypercholesteremia    controled  . Hypothyroid   . Shingles May 2014   "Right face, still has some"  . Temporal arteritis ([place])   . Transient blindness of both eyes    Past Surgical History:  Procedure Laterality Date  . ARTERY BIOPSY Right 06/20/2013   Procedure: BIOPSY TEMPORAL ARTERY RIGHT;  Surgeon: Earnstine Regal, MD;  Location: [facility];  Service: General;  Laterality: Right;  . CARDIAC CATHETERIZATION N/A 09/02/2015   Procedure: Left Heart Cath and  Coronary Angiography;  Surgeon: Charolette Forward, MD;  Location: [place] [facility];  Service: Cardiovascular;  Laterality: N/A;  . CARDIAC CATHETERIZATION N/A 09/02/2015   Procedure: Coronary Stent Intervention;  Surgeon: Charolette Forward, MD;  Location: [place] [facility];  Service: Cardiovascular;  Laterality: N/A;  1.  mid RCA      (3.0/28mm Xience) 2.  Mid LAD      (3.0/23mm Xience)  . NO PAST SURGERIES    . None       A IV Location/Drains/Wounds Patient Lines/Drains/Airways Status   Active Line/Drains/Airways    Name:   Placement date:   Placement time:   Site:   Days:   Peripheral IV 04/17/19 Left Antecubital   04/17/19    1852    Antecubital   less than 1   Incision 06/20/13 Face Right;Lateral   06/20/13    1105     2127          Intake/Output Last 24 hours No intake or output data in the 24 hours ending 04/17/19 2130  Labs/Imaging Results for orders placed or performed during the hospital encounter of 04/17/19 (from the past 48 hour(s))  CBC with Differential     Status: Abnormal   Collection Time: 04/17/19  7:24 PM  Result Value Ref Range   WBC 7.2 4.0 - 10.5 K/uL   RBC 3.52 (L) 4.22 - 5.81 MIL/uL   Hemoglobin 11.0 (L) 13.0 - 17.0 g/dL   HCT 34.6 (L)  39.0 - 52.0 %   MCV 98.3 80.0 - 100.0 fL   MCH 31.3 26.0 - 34.0 pg   MCHC 31.8 30.0 - 36.0 g/dL   RDW 12.8 11.5 - 15.5 %   Platelets 232 150 - 400 K/uL   nRBC 0.0 0.0 - 0.2 %   Neutrophils Relative % 59 %   Neutro Abs 4.4 1.7 - 7.7 K/uL   Lymphocytes Relative 27 %   Lymphs Abs 1.9 0.7 - 4.0 K/uL   Monocytes Relative 8 %   Monocytes Absolute 0.6 0.1 - 1.0 K/uL   Eosinophils Relative 4 %   Eosinophils Absolute 0.3 0.0 - 0.5 K/uL   Basophils Relative 1 %   Basophils Absolute 0.1 0.0 - 0.1 K/uL   Immature Granulocytes 1 %   Abs Immature Granulocytes 0.07 0.00 - 0.07 K/uL    Comment: Performed at [facility] [address] [place], [place] [id number]  Basic metabolic panel     Status: Abnormal   Collection Time:  04/17/19  7:24 PM  Result Value Ref Range   Sodium 136 135 - 145 mmol/L   Potassium 4.3 3.5 - 5.1 mmol/L   Chloride 99 98 - 111 mmol/L   CO2 27 22 - 32 mmol/L   Glucose, Bld 96 70 - 99 mg/dL   BUN 26 (H) 8 - 23 mg/dL   Creatinine, Ser 1.22 0.61 - 1.24 mg/dL   Calcium 9.1 8.9 - 10.3 mg/dL   GFR calc non Af Amer 54 (L) >60 mL/min   GFR calc Af Amer >60 >60 mL/min   Anion gap 10 5 - 15    Comment: Performed at [facility], [place] [address], [place], [place] [address]  Protime-INR     Status: None   Collection Time: 04/17/19  7:24 PM  Result Value Ref Range   Prothrombin Time 13.8 11.4 - 15.2 seconds   INR 1.1 0.8 - 1.2    Comment: (NOTE) INR goal varies based on device and disease states. Performed at [facility], [place] [address] [place],  [address]    Mr Jeri Cos [id number] Contrast  Result Date: 04/17/2019 CLINICAL DATA:  Bilateral arm and leg weakness for 2 weeks. Confusion. EXAM: MRI HEAD WITHOUT AND WITH CONTRAST TECHNIQUE: Multiplanar, multiecho pulse sequences of the brain and surrounding structures were obtained without and with intravenous contrast. CONTRAST:  3mL MULTIHANCE GADOBENATE DIMEGLUMINE 529 MG/ML IV SOLN COMPARISON:  Head CT 06/12/2013 and MRI 06/06/2013 FINDINGS: Brain: A large, heterogeneously T1 and T2 hyperintense subdural hematoma over the right cerebral convexity measures up to 2.1 cm in thickness. Associated smooth dural enhancement is likely reactive. There is right cerebral hemispheric sulcal effacement with mass effect on the right lateral ventricle and 7 mm of leftward midline shift. Small foci of T2 hyperintensity in the cerebral white matter bilaterally are nonspecific but compatible with mild chronic small vessel ischemic disease, slightly progressed from 2014. Multiple small chronic cerebellar infarcts are again seen. There is moderate cerebral atrophy. No acute infarct is evident. Vascular: Major intracranial vascular flow voids are preserved.  Skull and upper cervical spine: Unremarkable bone marrow signal. Sinuses/Orbits: Bilateral cataract extraction. Small right maxillary sinus with mild mucosal thickening and/or small volume fluid. Clear mastoid air cells. Other: None. IMPRESSION: 1. Large right cerebral convexity subdural hematoma with 7 mm of leftward midline shift. 2. Mild chronic small vessel ischemic disease. 3. Chronic cerebellar infarcts. Critical Value/emergent results were called by telephone at the time  of interpretation on 04/17/2019 at 5:57 p.m. to Dr. Derrel Nip (on-call for Dr. Quay Burow), who verbally acknowledged these results and asked that the patient be directed to the [facility] Emergency Department for further evaluation. Electronically Signed   By: Logan Bores M.D.   On: 04/17/2019 18:12    Pending Labs Unresulted Labs (From admission, onward)    Start     Ordered   04/17/19 1927  SARS CORONAVIRUS 2 (TAT 6-12 HRS) Nasal Swab Aptima Multi Swab  (Asymptomatic/Tier 2 Patients Labs)  Once,   STAT    Question Answer Comment  Is this test for diagnosis or screening Screening   Symptomatic for COVID-19 as defined by CDC No   Hospitalized for COVID-19 No   Admitted to ICU for COVID-19 No   Previously tested for COVID-19 No   Resident in a congregate (group) care setting No   Employed in healthcare setting No      04/17/19 1927          Vitals/Pain Today's Vitals   04/17/19 1828 04/17/19 1835 04/17/19 1836 04/17/19 1900  BP: 129/63   115/64  Pulse: 66   65  Resp: 18   19  Temp: 98.1 F (36.7 C)     TempSrc: Oral     SpO2: 100%   98%  PainSc:  0-No pain 0-No pain     Isolation Precautions No active isolations  Medications Medications  atorvastatin (LIPITOR) tablet 40 mg (has no administration in time range)  metoprolol succinate (TOPROL-XL) 24 hr tablet 12.5 mg (has no administration in time range)  nitroGLYCERIN (NITROSTAT) SL tablet 0.4 mg (has no administration in time range)  ramipril (ALTACE) capsule  1.25 mg (has no administration in time range)  ALPRAZolam (XANAX) tablet 0.5 mg (has no administration in time range)  buPROPion (WELLBUTRIN XL) 24 hr tablet 150 mg (has no administration in time range)  imipramine (TOFRANIL) tablet 10 mg (has no administration in time range)  venlafaxine XR (EFFEXOR-XR) 24 hr capsule 37.5 mg (has no administration in time range)  levothyroxine (SYNTHROID) tablet 88 mcg (has no administration in time range)  tamsulosin (FLOMAX) capsule 0.8 mg (has no administration in time range)  clobetasol cream (TEMOVATE) 0.05 % (has no administration in time range)  sodium chloride flush (NS) 0.9 % injection 3 mL (has no administration in time range)  sodium chloride flush (NS) 0.9 % injection 3 mL (has no administration in time range)  0.9 %  sodium chloride infusion (has no administration in time range)    Mobility Pt has not feficits until he tries to walk.  Reports from home he is very unstable on feet.  ED has not had pt up at this time. High fall risk   Focused Assessments Neuro Assessment Handoff:  Swallow screen pass? No    NIH Stroke Scale ( + Modified Stroke Scale Criteria)  Interval: Initial Level of Consciousness (1a.)   : Alert, keenly responsive LOC Questions (1b. )   +: Answers both questions correctly LOC Commands (1c. )   + : Performs both tasks correctly Best Gaze (2. )  +: Normal Visual (3. )  +: No visual loss Facial Palsy (4. )    : Normal symmetrical movements Motor Arm, Left (5a. )   +: No drift Motor Arm, Right (5b. )   +: No drift Motor Leg, Left (6a. )   +: No drift Motor Leg, Right (6b. )   +: No drift Limb Ataxia (7. ): Absent Sensory (  8. )   +: Normal, no sensory loss Best Language (9. )   +: No aphasia Dysarthria (10. ): Normal Extinction/Inattention (11.)   +: No Abnormality Modified SS Total  +: 0 Complete NIHSS TOTAL: 0     Neuro Assessment: Within Defined Limits Neuro Checks:   Initial (04/17/19 1859)  Last  Documented NIHSS Modified Score: 0 (04/17/19 1859) Has TPA been given? No If patient is a Neuro Trauma and patient is going to OR before floor call report to [place] nurse: [phone] or [phone]     R Recommendations: See Admitting Provider Note  Report given to:   Additional Notes: Pt alert and oriented at this time.  Reports that he has had trouble walking for several days and has trouble swallowing "a horrible time trying to swallow."  Failed test!

## 2019-04-17 NOTE — ED Triage Notes (Signed)
Pt here from PCP for neurosurgery consult. Pt has had multiple falls, leading to MRI today. 2 cm hematoma present and 7 mm bleed with shift.

## 2019-04-17 NOTE — H&P (Signed)
Providing Compassionate, Quality Care - Together  Benjamin Hahn is an 85 y.o. male.    Chief Complaint: Subdural hematoma  HPI: Patient presented to his primary care provider due to multiple falls over the past two months. He denies loss of consciousness with his falls, but does report issues with coordination. An MRI scan was ordered and a large right cerebral convexity subdural hematoma with 7 mm of leftward midline shift was identified. He was advised to go to the emergency department for admission and evaluation by neurosurgery.   Past Medical History:  Diagnosis Date   Anxiety    Arthritis    might be in back, no problems   BPH (benign prostatic hyperplasia)    Depression 1990s   GERD (gastroesophageal reflux disease)    occasional   Hypercholesteremia    controled   Hypothyroid    Shingles May 2014   "Right face, still has some"   Temporal arteritis ([place])    Transient blindness of both eyes     Past Surgical History:  Procedure Laterality Date   ARTERY BIOPSY Right 06/20/2013   Procedure: BIOPSY TEMPORAL ARTERY RIGHT;  Surgeon: Earnstine Regal, MD;  Location: [facility];  Service: General;  Laterality: Right;   CARDIAC CATHETERIZATION N/A 09/02/2015   Procedure: Left Heart Cath and Coronary Angiography;  Surgeon: Charolette Forward, MD;  Location: [place] [facility];  Service: Cardiovascular;  Laterality: N/A;   CARDIAC CATHETERIZATION N/A 09/02/2015   Procedure: Coronary Stent Intervention;  Surgeon: Charolette Forward, MD;  Location: [place] [facility];  Service: Cardiovascular;  Laterality: N/A;  1.  mid RCA      (3.0/28mm Xience) 2.  Mid LAD      (3.0/23mm Xience)   NO PAST SURGERIES     None      Family History  Problem Relation Age of Onset   Stroke Father        Died, 85s   Stroke Sister        Living, 34   Heart disease Mother        Died, 40   Healthy Daughter    Diabetes Maternal Grandmother    Hypertension Neg Hx    Social History:   reports that he quit smoking about 52 years ago. His smoking use included cigarettes. He has never used smokeless tobacco. He reports current alcohol use of about 2.0 standard drinks of alcohol per week. He reports that he does not use drugs.  Allergies:  Allergies  Allergen Reactions   Lorazepam      Results for orders placed or performed during the hospital encounter of 04/17/19 (from the past 48 hour(s))  CBC with Differential     Status: Abnormal   Collection Time: 04/17/19  7:24 PM  Result Value Ref Range   WBC 7.2 4.0 - 10.5 K/uL   RBC 3.52 (L) 4.22 - 5.81 MIL/uL   Hemoglobin 11.0 (L) 13.0 - 17.0 g/dL   HCT 34.6 (L) 39.0 - 52.0 %   MCV 98.3 80.0 - 100.0 fL   MCH 31.3 26.0 - 34.0 pg   MCHC 31.8 30.0 - 36.0 g/dL   RDW 12.8 11.5 - 15.5 %   Platelets 232 150 - 400 K/uL   nRBC 0.0 0.0 - 0.2 %   Neutrophils Relative % 59 %   Neutro Abs 4.4 1.7 - 7.7 K/uL   Lymphocytes Relative 27 %   Lymphs Abs 1.9 0.7 - 4.0 K/uL   Monocytes Relative 8 %  Monocytes Absolute 0.6 0.1 - 1.0 K/uL   Eosinophils Relative 4 %   Eosinophils Absolute 0.3 0.0 - 0.5 K/uL   Basophils Relative 1 %   Basophils Absolute 0.1 0.0 - 0.1 K/uL   Immature Granulocytes 1 %   Abs Immature Granulocytes 0.07 0.00 - 0.07 K/uL    Comment: Performed at [facility] [address] [place], [place] [id number]  Basic metabolic panel     Status: Abnormal   Collection Time: 04/17/19  7:24 PM  Result Value Ref Range   Sodium 136 135 - 145 mmol/L   Potassium 4.3 3.5 - 5.1 mmol/L   Chloride 99 98 - 111 mmol/L   CO2 27 22 - 32 mmol/L   Glucose, Bld 96 70 - 99 mg/dL   BUN 26 (H) 8 - 23 mg/dL   Creatinine, Ser 1.22 0.61 - 1.24 mg/dL   Calcium 9.1 8.9 - 10.3 mg/dL   GFR calc non Af Amer 54 (L) >60 mL/min   GFR calc Af Amer >60 >60 mL/min   Anion gap 10 5 - 15    Comment: Performed at [facility], [place] [address], [place], [place] [address]  Protime-INR     Status: None   Collection Time: 04/17/19  7:24 PM    Result Value Ref Range   Prothrombin Time 13.8 11.4 - 15.2 seconds   INR 1.1 0.8 - 1.2    Comment: (NOTE) INR goal varies based on device and disease states. Performed at [facility], [place] [address] [place], [place] [address]    Mr Jeri Cos [id number] Contrast  Result Date: 04/17/2019 CLINICAL DATA:  Bilateral arm and leg weakness for 2 weeks. Confusion. EXAM: MRI HEAD WITHOUT AND WITH CONTRAST TECHNIQUE: Multiplanar, multiecho pulse sequences of the brain and surrounding structures were obtained without and with intravenous contrast. CONTRAST:  6mL MULTIHANCE GADOBENATE DIMEGLUMINE 529 MG/ML IV SOLN COMPARISON:  Head CT 06/12/2013 and MRI 06/06/2013 FINDINGS: Brain: A large, heterogeneously T1 and T2 hyperintense subdural hematoma over the right cerebral convexity measures up to 2.1 cm in thickness. Associated smooth dural enhancement is likely reactive. There is right cerebral hemispheric sulcal effacement with mass effect on the right lateral ventricle and 7 mm of leftward midline shift. Small foci of T2 hyperintensity in the cerebral white matter bilaterally are nonspecific but compatible with mild chronic small vessel ischemic disease, slightly progressed from 2014. Multiple small chronic cerebellar infarcts are again seen. There is moderate cerebral atrophy. No acute infarct is evident. Vascular: Major intracranial vascular flow voids are preserved. Skull and upper cervical spine: Unremarkable bone marrow signal. Sinuses/Orbits: Bilateral cataract extraction. Small right maxillary sinus with mild mucosal thickening and/or small volume fluid. Clear mastoid air cells. Other: None. IMPRESSION: 1. Large right cerebral convexity subdural hematoma with 7 mm of leftward midline shift. 2. Mild chronic small vessel ischemic disease. 3. Chronic cerebellar infarcts. Critical Value/emergent results were called by telephone at the time of interpretation on 04/17/2019 at 5:57 p.m. to Dr. Derrel Nip (on-call for Dr.  Quay Burow), who verbally acknowledged these results and asked that the patient be directed to the [facility] Emergency Department for further evaluation. Electronically Signed   By: Logan Bores M.D.   On: 04/17/2019 18:12    Review of Systems  Constitutional: Negative for chills, fever, malaise/fatigue and weight loss.  HENT: Negative for congestion, ear discharge, ear pain, sinus pain and sore throat.   Eyes: Negative.  Negative for blurred vision, double vision,  photophobia and pain.  Respiratory: Negative for cough, sputum production, shortness of breath and wheezing.   Cardiovascular: Negative.  Negative for chest pain, palpitations, claudication and leg swelling.  Gastrointestinal: Negative for abdominal pain, constipation, diarrhea, heartburn, nausea and vomiting.  Genitourinary: Negative for dysuria, flank pain, frequency, hematuria and urgency.  Musculoskeletal: Positive for falls. Negative for back pain, joint pain, myalgias and neck pain.  Skin: Negative for itching and rash.  Neurological: Negative for dizziness, tingling, tremors, sensory change, speech change, focal weakness, seizures, loss of consciousness, weakness and headaches.  Psychiatric/Behavioral: Negative for depression, memory loss, substance abuse and suicidal ideas. The patient is not nervous/anxious and does not have insomnia.     Blood pressure 115/64, pulse 65, temperature 98.1 F (36.7 C), temperature source Oral, resp. rate 19, SpO2 98 %. Physical Exam  Constitutional: He is oriented to person, place, and time. He appears well-developed and well-nourished.  HENT:  Head: Normocephalic and atraumatic.  Eyes: Pupils are equal, round, and reactive to light. Conjunctivae and EOM are normal.  Neck: Normal range of motion. Neck supple.  Cardiovascular: Normal rate and regular rhythm.  Respiratory: Effort normal and breath sounds normal.  GI: Soft. Bowel sounds are normal.  Musculoskeletal: Normal range of motion.    Neurological: He is alert and oriented to person, place, and time. He has normal strength. No cranial nerve deficit or sensory deficit. GCS eye subscore is 4. GCS verbal subscore is 5. GCS motor subscore is 6.  Skin: Skin is warm and dry.  Psychiatric: He has a normal mood and affect. His speech is normal and behavior is normal. Thought content normal.     Assessment/Plan Mr. Fausnaugh will be admitted to the progressive unit for continued monitoring of his neurological status and further work up for surgery later this week. Dr. [name] will likely perform surgery for subdural evacuation on Wednesday or Thursday of this week.   Patricia Nettle, NP 04/17/2019, 9:03 PM

## 2019-04-17 NOTE — Telephone Encounter (Signed)
Dr. Jeralyn Ruths from [facility] calling to request to speak to on call provider. On call provider, Dr. Derrel Nip called and conference call initiated. Dr. Derrel Nip will return call to obtain pt information due to being in the car at the time of call.

## 2019-04-17 NOTE — Telephone Encounter (Signed)
Results of CT scan conferred by Dr Jeralyn Ruths to Dr Derrel Nip  patient has large SDH measuring at least 2 cm,  With a 7 mm midline shift to the left   Advised him to send patient to the  [facility] ER for admission and neurosurgical evaluation.  Dr Derrel Nip called ER Triage RN and spoke with Erline Levine who was receiving patient and taking him to Trauma bed . At time of call patient's vital signs were stable and he was coherent /mentating well.

## 2019-04-17 NOTE — Progress Notes (Signed)
Please contact patient's wife, Demarri Weideman [phone], in the morning with plan of care as she will not be allowed into the hospital until 10:00 am.

## 2019-04-17 NOTE — ED Provider Notes (Signed)
[facility] EMERGENCY DEPARTMENT Provider Note   CSN: [id number] Arrival date & time: 04/17/19  1823     History   Chief Complaint No chief complaint on file.   HPI Benjamin Hahn is a 85 y.o. male with a past medical history of GERD and depression who presents to the emergency department with concerns for SDH on outpatient imaging. Patient and family report patient has had more frequent falls over the last several weeks with his most recent fall approximately 1 week ago. Patient reports feeling more unsteady on his feet and having more trouble getting in and out of chairs over this time period. Patient reports he has started to use a walker sometimes when he ambulates. Patient reports taking ASA 81 mg daily and denies any other blood thinner use. Patient denies headache, vision changes, or changes in his speech. Family reports patient is very active and still [employer]. Due to his unsteadiness, patient's PCP ordered an outpatient MRI brain for further evaluation. MRI showed R SDH with 7 mm shift. PCP advised patient to come to emergency department for further evaluation and management. Patient reports some nausea today which is new for him.      The history is provided by the patient and the spouse.    Past Medical History:  Diagnosis Date   Anxiety    Arthritis    might be in back, no problems   BPH (benign prostatic hyperplasia)    Depression 1990s   GERD (gastroesophageal reflux disease)    occasional   Hypercholesteremia    controled   Hypothyroid    Shingles May 2014   "Right face, still has some"   Temporal arteritis ([place])    Transient blindness of both eyes     Patient Active Problem List   Diagnosis Date Noted   Subdural hematoma ([place]) 04/17/2019   Confusion 04/15/2019   Urinary frequency 04/15/2019   Memory changes 04/15/2019   Hyperglycemia 04/15/2019   Shuffling gait 04/15/2019   Weakness generalized 04/14/2019    Mild intermittent asthma with acute exacerbation 02/08/2018   Hypothyroidism 03/14/2013   Hyperlipidemia LDL goal <70 03/14/2013   BPH (benign prostatic hypertrophy) 03/14/2013   Hypogonadism, male 03/14/2013   Erectile dysfunction 03/14/2013    Past Surgical History:  Procedure Laterality Date   ARTERY BIOPSY Right 06/20/2013   Procedure: BIOPSY TEMPORAL ARTERY RIGHT;  Surgeon: Earnstine Regal, MD;  Location: [facility];  Service: General;  Laterality: Right;   CARDIAC CATHETERIZATION N/A 09/02/2015   Procedure: Left Heart Cath and Coronary Angiography;  Surgeon: Charolette Forward, MD;  Location: [facility];  Service: Cardiovascular;  Laterality: N/A;   CARDIAC CATHETERIZATION N/A 09/02/2015   Procedure: Coronary Stent Intervention;  Surgeon: Charolette Forward, MD;  Location: [facility];  Service: Cardiovascular;  Laterality: N/A;  1.  mid RCA      (3.0/28mm Xience) 2.  Mid LAD      (3.0/23mm Xience)   NO PAST SURGERIES     None          Home Medications    Prior to Admission medications   Medication Sig Start Date End Date Taking? Authorizing Provider  acetaminophen (TYLENOL) 500 MG tablet Take 500 mg by mouth daily as needed (pain).   Yes [provider]  ALPRAZolam (XANAX) 0.25 MG tablet 2  qhs Patient taking differently: Take 0.5 mg by mouth at bedtime.  04/11/19  Yes Plovsky, Berneta Sages, MD  aspirin 81 MG chewable  tablet Chew 1 tablet (81 mg total) by mouth daily. 09/03/15  Yes Charolette Forward, MD  atorvastatin (LIPITOR) 40 MG tablet Take 1 tablet (40 mg total) by mouth daily at 6 PM. 09/03/15  Yes Charolette Forward, MD  buPROPion (WELLBUTRIN XL) 150 MG 24 hr tablet Take 1 tablet (150 mg total) by mouth daily. 2  qam Patient taking differently: Take 150 mg by mouth daily.  04/13/19  Yes Plovsky, Berneta Sages, MD  clobetasol cream (TEMOVATE) 0.05 % APPLY AS DIRECTED. Patient taking differently: Apply 1 application topically daily as needed (Leg dryness).  07/25/18  Yes Elayne Snare, MD  imipramine (TOFRANIL) 10 MG tablet 1 q hs Patient taking differently: Take 10 mg by mouth at bedtime.  04/13/19  Yes Plovsky, Berneta Sages, MD  levothyroxine (SYNTHROID) 88 MCG tablet TAKE 1 TABLET BY MOUTH DAILY. Patient taking differently: Take 88 mcg by mouth every evening.  02/13/19  Yes Janith Lima, MD  metoprolol succinate (TOPROL-XL) 25 MG 24 hr tablet TAKE (1/2) TABLET DAILY. Patient taking differently: Take 12.5 mg by mouth daily.  01/18/19  Yes Janith Lima, MD  nitroGLYCERIN (NITROSTAT) 0.4 MG SL tablet Place 1 tablet (0.4 mg total) under the tongue every 5 (five) minutes as needed for chest pain (CP or SOB). 09/03/15  Yes Charolette Forward, MD  ramipril (ALTACE) 1.25 MG capsule Take 1 capsule (1.25 mg total) by mouth daily. 09/03/15  Yes Charolette Forward, MD  tamsulosin (FLOMAX) 0.4 MG CAPS capsule TAKE (1) CAPSULE DAILY. Patient taking differently: Take 0.8 mg by mouth daily after supper.  04/17/14  Yes Elayne Snare, MD  venlafaxine XR (EFFEXOR-XR) 37.5 MG 24 hr capsule Take 1 capsule (37.5 mg total) by mouth daily. 04/13/19 04/12/20 Yes Plovsky, Berneta Sages, MD    Family History Family History  Problem Relation Age of Onset   Stroke Father        Died, 29s   Stroke Sister        Living, 56   Heart disease Mother        Died, 33   Healthy Daughter    Diabetes Maternal Grandmother    Hypertension Neg Hx     Social History Social History   Tobacco Use   Smoking status: Former Smoker    Types: Cigarettes    Quit date: 08/24/1966    Years since quitting: 52.6   Smokeless tobacco: Never Used  Substance Use Topics   Alcohol use: Yes    Alcohol/week: 2.0 standard drinks    Types: 2 Shots of liquor per week    Comment: socially   Drug use: No     Allergies   Lorazepam   Review of Systems Review of Systems  Constitutional: Positive for fatigue. Negative for fever.  HENT: Negative for congestion and trouble swallowing.   Eyes: Negative for photophobia and  visual disturbance.  Respiratory: Negative for cough and shortness of breath.   Cardiovascular: Negative for chest pain and leg swelling.  Gastrointestinal: Positive for nausea. Negative for abdominal pain, constipation, diarrhea and vomiting.  Genitourinary: Negative for dysuria.  Musculoskeletal: Positive for gait problem. Negative for neck pain and neck stiffness.  Skin: Negative for wound.  Neurological: Negative for tremors, seizures, facial asymmetry, speech difficulty, weakness, light-headedness, numbness and headaches.  Psychiatric/Behavioral: Negative for confusion.     Physical Exam Updated Vital Signs BP 136/65 (BP Location: Right Arm)    Pulse 79    Temp 97.9 F (36.6 C) (Oral)    Resp 18  Ht 5' 7.5" (1.715 m)    Wt 87.1 kg    SpO2 96%    BMI 29.63 kg/m   Physical Exam Constitutional:      General: He is not in acute distress. HENT:     Head: Normocephalic and atraumatic.     Right Ear: External ear normal.     Left Ear: External ear normal.     Nose: Nose normal.     Mouth/Throat:     Mouth: Mucous membranes are moist.     Pharynx: Oropharynx is clear.  Eyes:     General: No visual field deficit.    Extraocular Movements: Extraocular movements intact.     Pupils: Pupils are equal, round, and reactive to light.  Neck:     Musculoskeletal: Neck supple.  Cardiovascular:     Rate and Rhythm: Normal rate and regular rhythm.     Pulses: Normal pulses.  Pulmonary:     Effort: Pulmonary effort is normal. No respiratory distress.     Breath sounds: Normal breath sounds. No wheezing, rhonchi or rales.  Chest:     Chest wall: No tenderness.  Abdominal:     Palpations: Abdomen is soft.     Tenderness: There is no abdominal tenderness. There is no guarding.  Musculoskeletal:     Right lower leg: No edema.     Left lower leg: No edema.  Skin:    General: Skin is dry.  Neurological:     General: No focal deficit present.     Mental Status: He is alert and oriented  to person, place, and time.     GCS: GCS eye subscore is 4. GCS verbal subscore is 5. GCS motor subscore is 6.     Cranial Nerves: No cranial nerve deficit or dysarthria.     Sensory: No sensory deficit.     Motor: No weakness, tremor or pronator drift.     Coordination: Coordination normal. Finger-Nose-Finger Test normal.     Comments: No clonus bilaterally. 5/5 strength in bilateral upper and lower extremities.       ED Treatments / Results  Labs (all labs ordered are listed, but only abnormal results are displayed) Labs Reviewed  CBC WITH DIFFERENTIAL/PLATELET - Abnormal; Notable for the following components:      Result Value   RBC 3.52 (*)    Hemoglobin 11.0 (*)    HCT 34.6 (*)    All other components within normal limits  BASIC METABOLIC PANEL - Abnormal; Notable for the following components:   BUN 26 (*)    GFR calc non Af Amer 54 (*)    All other components within normal limits  SARS CORONAVIRUS 2 (TAT 6-12 HRS)  PROTIME-INR    EKG None  Radiology Mr Jeri Cos Wo Contrast  Result Date: 04/17/2019 CLINICAL DATA:  Bilateral arm and leg weakness for 2 weeks. Confusion. EXAM: MRI HEAD WITHOUT AND WITH CONTRAST TECHNIQUE: Multiplanar, multiecho pulse sequences of the brain and surrounding structures were obtained without and with intravenous contrast. CONTRAST:  94mL MULTIHANCE GADOBENATE DIMEGLUMINE 529 MG/ML IV SOLN COMPARISON:  Head CT 06/12/2013 and MRI 06/06/2013 FINDINGS: Brain: A large, heterogeneously T1 and T2 hyperintense subdural hematoma over the right cerebral convexity measures up to 2.1 cm in thickness. Associated smooth dural enhancement is likely reactive. There is right cerebral hemispheric sulcal effacement with mass effect on the right lateral ventricle and 7 mm of leftward midline shift. Small foci of T2 hyperintensity in the cerebral white matter bilaterally  are nonspecific but compatible with mild chronic small vessel ischemic disease, slightly progressed  from 2014. Multiple small chronic cerebellar infarcts are again seen. There is moderate cerebral atrophy. No acute infarct is evident. Vascular: Major intracranial vascular flow voids are preserved. Skull and upper cervical spine: Unremarkable bone marrow signal. Sinuses/Orbits: Bilateral cataract extraction. Small right maxillary sinus with mild mucosal thickening and/or small volume fluid. Clear mastoid air cells. Other: None. IMPRESSION: 1. Large right cerebral convexity subdural hematoma with 7 mm of leftward midline shift. 2. Mild chronic small vessel ischemic disease. 3. Chronic cerebellar infarcts. Critical Value/emergent results were called by telephone at the time of interpretation on 04/17/2019 at 5:57 p.m. to Dr. Derrel Nip (on-call for Dr. Quay Burow), who verbally acknowledged these results and asked that the patient be directed to the [facility] Emergency Department for further evaluation. Electronically Signed   By: Logan Bores M.D.   On: 04/17/2019 18:12    Procedures Procedures (including critical care time)  Medications Ordered in ED Medications  atorvastatin (LIPITOR) tablet 40 mg (has no administration in time range)  nitroGLYCERIN (NITROSTAT) SL tablet 0.4 mg (has no administration in time range)  ramipril (ALTACE) capsule 1.25 mg (1.25 mg Oral Given 04/17/19 2300)  ALPRAZolam (XANAX) tablet 0.5 mg (0.5 mg Oral Given 04/17/19 2309)  buPROPion (WELLBUTRIN XL) 24 hr tablet 150 mg (150 mg Oral Given 04/17/19 2258)  imipramine (TOFRANIL) tablet 10 mg (10 mg Oral Given 04/17/19 2302)  venlafaxine XR (EFFEXOR-XR) 24 hr capsule 37.5 mg (has no administration in time range)  tamsulosin (FLOMAX) capsule 0.8 mg (has no administration in time range)  clobetasol cream (TEMOVATE) 0.05 % (has no administration in time range)  sodium chloride flush (NS) 0.9 % injection 3 mL (3 mLs Intravenous Given 04/17/19 2303)  sodium chloride flush (NS) 0.9 % injection 3 mL (has no administration in time range)  0.9 %   sodium chloride infusion (has no administration in time range)  levothyroxine (SYNTHROID) tablet 88 mcg (has no administration in time range)  metoprolol succinate (TOPROL-XL) 24 hr tablet 12.5 mg (has no administration in time range)     Initial Impression / Assessment and Plan / ED Course  I have reviewed the triage vital signs and the nursing notes.  Pertinent labs & imaging results that were available during my care of the patient were reviewed by me and considered in my medical decision making (see chart for details).       More frequent falls/unstead gait. Found to have right SDH on outpatient MRI. Non-focal neuro exam. Patient reports some new nausea. On ASA 81 mg daily, no other blood thinners. Hemodynamically stable and GCS 15. Neurosurgery consulted who recommended admission to their service. Neurosurgery did not recommend emergent surgical intervention or decadron at this time. Family and patient updated on results and plan at this time. Patient was admitted to the neurosurgical service for further evaluation and management.   Patient seen and plan discussed with Dr. Kathrynn Humble.  Final Clinical Impressions(s) / ED Diagnoses   Final diagnoses:  SDH (subdural hematoma) [facility])    ED Discharge Orders    None       Betsey Amen, MD 04/18/19 [id number]    Varney Biles, MD 04/18/19 [phone]

## 2019-04-18 ENCOUNTER — Other Ambulatory Visit: Payer: Self-pay | Admitting: Neurosurgery

## 2019-04-18 LAB — TYPE AND SCREEN
ABO/RH(D): A POS
Antibody Screen: NEGATIVE

## 2019-04-18 LAB — SARS CORONAVIRUS 2 (TAT 6-24 HRS): SARS Coronavirus 2: NEGATIVE

## 2019-04-18 MED ORDER — CHLORHEXIDINE GLUCONATE CLOTH 2 % EX PADS: 6.0000 | MEDICATED_PAD | Freq: Once | CUTANEOUS | Status: DC

## 2019-04-18 MED ORDER — METHOCARBAMOL 500 MG PO TABS
750.0000 mg | ORAL_TABLET | Freq: Four times a day (QID) | ORAL | Status: DC
  Administered 2019-04-18 – 2019-04-20 (×3): 750 mg via ORAL
  Filled 2019-04-18: qty 1
  Filled 2019-04-18: qty 2
  Filled 2019-04-18: qty 1
  Filled 2019-04-18 (×3): qty 2

## 2019-04-18 MED ORDER — ACETAMINOPHEN 500 MG PO TABS
1000.0000 mg | ORAL_TABLET | Freq: Four times a day (QID) | ORAL | Status: DC
  Administered 2019-04-18 – 2019-04-20 (×4): 1000 mg via ORAL
  Filled 2019-04-18 (×5): qty 2

## 2019-04-18 MED ORDER — CHLORHEXIDINE GLUCONATE CLOTH 2 % EX PADS
6.0000 | MEDICATED_PAD | Freq: Once | CUTANEOUS | Status: AC
  Administered 2019-04-18: 21:00:00 6 via TOPICAL

## 2019-04-18 NOTE — Plan of Care (Signed)
Progressing towards goals

## 2019-04-18 NOTE — Evaluation (Signed)
Physical Therapy Evaluation Patient Details Name: Benjamin Hahn MRN: [id number] DOB: 1933/07/12 Today's Date: 04/18/2019   History of Present Illness  Pt is an 85 y/o male admitted secondary to multiple falls. Found to have Large R subdural hematoma and will likely have surgery during admission for evacuation of hematoma. PMH includes arthrits and temporal artertitis.   Clinical Impression  Pt admitted secondary to problem above with deficits below. Pt requiring mod to max A during transfer to chair using RW. Pt is very motivated to regain independence and has good support from his wife. Feel he would be appropriate for CIR level therapies. Pt likely to surgery during admission, so will continue to follow acutely and update recommendations as appropriate.     Follow Up Recommendations CIR    Equipment Recommendations  Other (comment)(TBD)    Recommendations for Other Services       Precautions / Restrictions Precautions Precautions: Fall Restrictions Weight Bearing Restrictions: No      Mobility  Bed Mobility Overal bed mobility: Needs Assistance Bed Mobility: Supine to Sit     Supine to sit: Mod assist     General bed mobility comments: Mod A for trunk elevation and assist to scoot hips to EOB.   Transfers Overall transfer level: Needs assistance Equipment used: Rolling walker (2 wheeled) Transfers: Sit to/from [facility] Sit to Stand: Max assist Stand pivot transfers: Mod assist       General transfer comment: Max A for lift assist and steadying to stand. Initially pt with posterior lean and requiring physical assist for anterior weight shift. Mod A for transfer to chair using RW for steadying. Increased time to perform transfer.   Ambulation/Gait                Stairs            Wheelchair Mobility    Modified Rankin (Stroke Patients Only)       Balance Overall balance assessment: Needs assistance Sitting-balance  support: No upper extremity supported;Feet supported Sitting balance-Leahy Scale: Fair     Standing balance support: Bilateral upper extremity supported;During functional activity Standing balance-Leahy Scale: Poor Standing balance comment: Reliant on BUE and external support                             Pertinent Vitals/Pain Pain Assessment: No/denies pain    Home Living Family/patient expects to be discharged to:: Private residence Living Arrangements: Spouse/significant other Available Help at Discharge: Family Type of Home: House Home Access: Level entry     Home Layout: Two level;Able to live on main level with bedroom/bathroom Home Equipment: Walker - 4 wheels      Prior Function Level of Independence: Independent with assistive device(s)         Comments: was using rollator for mobility but had multiple falls     Hand Dominance        Extremity/Trunk Assessment   Upper Extremity Assessment Upper Extremity Assessment: Defer to OT evaluation    Lower Extremity Assessment Lower Extremity Assessment: Generalized weakness    Cervical / Trunk Assessment Cervical / Trunk Assessment: Kyphotic  Communication   Communication: No difficulties  Cognition Arousal/Alertness: Awake/alert Behavior During Therapy: WFL for tasks assessed/performed Overall Cognitive Status: Impaired/Different from baseline Area of Impairment: Problem solving  Problem Solving: Slow processing        General Comments General comments (skin integrity, edema, etc.): Pt's wife present during session     Exercises     Assessment/Plan    PT Assessment Patient needs continued PT services  PT Problem List Decreased strength;Decreased balance;Decreased mobility;Decreased knowledge of use of DME;Decreased knowledge of precautions       PT Treatment Interventions DME instruction;Gait training;Functional mobility training;Therapeutic  activities;Therapeutic exercise;Balance training;Patient/family education    PT Goals (Current goals can be found in the Care Plan section)  Acute Rehab PT Goals Patient Stated Goal: "to get out of the bed"  PT Goal Formulation: With patient Time For Goal Achievement: 05/02/19 Potential to Achieve Goals: Good    Frequency Min 3X/week   Barriers to discharge        Co-evaluation               AM-PAC PT "6 Clicks" Mobility  Outcome Measure Help needed turning from your back to your side while in a flat bed without using bedrails?: A Lot Help needed moving from lying on your back to sitting on the side of a flat bed without using bedrails?: A Lot Help needed moving to and from a bed to a chair (including a wheelchair)?: A Lot Help needed standing up from a chair using your arms (e.g., wheelchair or bedside chair)?: Total Help needed to walk in hospital room?: Total Help needed climbing 3-5 steps with a railing? : Total 6 Click Score: 9    End of Session Equipment Utilized During Treatment: Gait belt Activity Tolerance: Patient tolerated treatment well Patient left: in chair;with call bell/phone within reach;with chair alarm set;with family/visitor present Nurse Communication: Mobility status PT Visit Diagnosis: Unsteadiness on feet (R26.81);Muscle weakness (generalized) (M62.81);History of falling (Z91.81);Repeated falls (R29.6)    Time: [id number] PT Time Calculation (min) (ACUTE ONLY): 21 min   Charges:   PT Evaluation $PT Eval Moderate Complexity: [place], PT, DPT  Acute Rehabilitation Services  Pager: [phone] Office: [phone]   Rudean Hitt 04/18/2019, 3:22 PM

## 2019-04-18 NOTE — Progress Notes (Signed)
Subjective: Patient admitted last night having just undergone MRI to evaluate repeated falls and weakness.  Patient had a significant fall 2 months ago where he fractured some ribs, and has had several more falls since then.  Has started to use a rolling walker.  Denies headache.  His family has noticed diminished physical capabilities since the fall 2 months ago.  Objective: Vital signs in last 24 hours: Vitals:   04/17/19 2209 04/17/19 2336 04/18/19 0323 04/18/19 0747  BP: 136/64 136/65 133/61 131/70  Pulse: 68 79 77 63  Resp: 18 18 16 20   Temp: 97.7 F (36.5 C) 97.9 F (36.6 C) 98 F (36.7 C) 98.2 F (36.8 C)  TempSrc: Oral Oral Oral Oral  SpO2: 95% 96% 98% 100%  Weight: 87.1 kg     Height: 5' 7.5" (1.715 m)       Intake/Output from previous day: 08/24 0701 - 08/25 0700 In: 240 [P.O.:240] Out: 450 [Urine:450] Intake/Output this shift: Total I/O In: 240 [P.O.:240] Out: -   Physical Exam: Awake alert, oriented.  Speech fluent.  Following commands.  EOMI.  Minimal left nasolabial fold flattening.  Mild left pronator drift.  Lower extremity strength 5/5 iliopsoas, quadriceps, dorsiflexion, and plantar flexion bilaterally.  CBC Recent Labs    04/17/19 1924  WBC 7.2  HGB 11.0*  HCT 34.6*  PLT 232   BMET Recent Labs    04/17/19 1924  NA 136  K 4.3  CL 99  CO2 27  GLUCOSE 96  BUN 26*  CREATININE 1.22  CALCIUM 9.1    Studies/Results: Mr Jeri Cos Wo Contrast  Result Date: 04/17/2019 CLINICAL DATA:  Bilateral arm and leg weakness for 2 weeks. Confusion. EXAM: MRI HEAD WITHOUT AND WITH CONTRAST TECHNIQUE: Multiplanar, multiecho pulse sequences of the brain and surrounding structures were obtained without and with intravenous contrast. CONTRAST:  27mL MULTIHANCE GADOBENATE DIMEGLUMINE 529 MG/ML IV SOLN COMPARISON:  Head CT 06/12/2013 and MRI 06/06/2013 FINDINGS: Brain: A large, heterogeneously T1 and T2 hyperintense subdural hematoma over the right cerebral convexity  measures up to 2.1 cm in thickness. Associated smooth dural enhancement is likely reactive. There is right cerebral hemispheric sulcal effacement with mass effect on the right lateral ventricle and 7 mm of leftward midline shift. Small foci of T2 hyperintensity in the cerebral white matter bilaterally are nonspecific but compatible with mild chronic small vessel ischemic disease, slightly progressed from 2014. Multiple small chronic cerebellar infarcts are again seen. There is moderate cerebral atrophy. No acute infarct is evident. Vascular: Major intracranial vascular flow voids are preserved. Skull and upper cervical spine: Unremarkable bone marrow signal. Sinuses/Orbits: Bilateral cataract extraction. Small right maxillary sinus with mild mucosal thickening and/or small volume fluid. Clear mastoid air cells. Other: None. IMPRESSION: 1. Large right cerebral convexity subdural hematoma with 7 mm of leftward midline shift. 2. Mild chronic small vessel ischemic disease. 3. Chronic cerebellar infarcts. Critical Value/emergent results were called by telephone at the time of interpretation on 04/17/2019 at 5:57 p.m. to Dr. Derrel Nip (on-call for Dr. Quay Burow), who verbally acknowledged these results and asked that the patient be directed to the [facility] Emergency Department for further evaluation. Electronically Signed   By: Logan Bores M.D.   On: 04/17/2019 18:12    Assessment/Plan: Patient with large right hemispheric subdural hematoma with subtle left hemiparesis, but repeated falls and unsteadiness.  Admitted last night by Dr. Arnoldo Morale.  Spoke with patient's wife, Lou Miner, at length by phone today.  We discussed the nature of his  subdural hematoma, the fact that it will not resolve on its own, and that left untreated it is likely to lead to further falls and injuries.  We have recommended craniotomy for evacuation of the subdural hematoma, and discussed the risks of infection, bleeding, reaccumulation of the subdural  hematoma and need for reoperation, neurologic deficits, and anesthetic risks.  We also discussed that he will likely need rehabilitation postoperatively, and further recovery later at home.  The uncertainty of prognosis and outcome were explained.  She would like [place] to proceed with surgery as soon as feasible, and we will contact the OR to make arrangements.  Orders for consent, type and screen, n.p.o. after midnight, etc. placed.  Hosie Spangle, MD 04/18/2019, 8:17 AM

## 2019-04-18 NOTE — TOC Initial Note (Signed)
Transition of Care [facility]) - Initial/Assessment Note    Patient Details  Name: Benjamin Hahn MRN: [id number] Date of Birth: 1932-10-02  [facility]) CM/SW Contact:    Pollie Friar, RN Phone Number: 04/18/2019, 1:45 PM  Clinical Narrative:                 Pt to have surgery tomorrow for subdural hematoma.  PT/OT evals after surgery per MD.  TOC following for d/c needs.  Expected Discharge Plan: Home/Self Care Barriers to Discharge: Continued Medical Work up   Patient Goals and CMS Choice        Expected Discharge Plan and Services Expected Discharge Plan: Home/Self Care       Living arrangements for the past 2 months: Single Family Home                                      Prior Living Arrangements/Services Living arrangements for the past 2 months: Single Family Home Lives with:: Spouse Patient language and need for interpreter reviewed:: Yes(no needs.) Do you feel safe going back to the place where you live?: Yes      Need for Family Participation in Patient Care: Yes (Comment) Care giver support system in place?: Yes (comment)(wife able to provide needed supervision) Current home services: DME(walker and bars in shower) Criminal Activity/Legal Involvement Pertinent to Current Situation/Hospitalization: No - Comment as needed  Activities of Daily Living Home Assistive Devices/Equipment: Gilford Rile (specify type) ADL Screening (condition at time of admission) Patient's cognitive ability adequate to safely complete daily activities?: Yes Is the patient deaf or have difficulty hearing?: No Does the patient have difficulty seeing, even when wearing glasses/contacts?: No Does the patient have difficulty concentrating, remembering, or making decisions?: No Patient able to express need for assistance with ADLs?: Yes Does the patient have difficulty dressing or bathing?: No Independently performs ADLs?: Yes (appropriate for developmental age) Does  the patient have difficulty walking or climbing stairs?: Yes Weakness of Legs: Both Weakness of Arms/Hands: None  Permission Sought/Granted                  Emotional Assessment Appearance:: Appears stated age Attitude/Demeanor/Rapport: Engaged Affect (typically observed): Accepting, Pleasant Orientation: : Oriented to Self, Oriented to Place, Oriented to  Time, Oriented to Situation   Psych Involvement: No (comment)  Admission diagnosis:  Hypogonadism male [E29.1] SDH (subdural hematoma) ([place]) [S06.5X9A] Patient Active Problem List   Diagnosis Date Noted  . Subdural hematoma ([place]) 04/17/2019  . Confusion 04/15/2019  . Urinary frequency 04/15/2019  . Memory changes 04/15/2019  . Hyperglycemia 04/15/2019  . Shuffling gait 04/15/2019  . Weakness generalized 04/14/2019  . Mild intermittent asthma with acute exacerbation 02/08/2018  . Hypothyroidism 03/14/2013  . Hyperlipidemia LDL goal <70 03/14/2013  . BPH (benign prostatic hypertrophy) 03/14/2013  . Hypogonadism, male 03/14/2013  . Erectile dysfunction 03/14/2013   PCP:  Janith Lima, MD Pharmacy:   [facility], [place] [address] Phone: [phone] Fax: [phone]     Social Determinants of Health (SDOH) Interventions    Readmission Risk Interventions No flowsheet data found.

## 2019-04-19 ENCOUNTER — Encounter (HOSPITAL_COMMUNITY)
Admission: EM | Disposition: A | Discharge status: Rehabilitation Facility | Payer: Self-pay | Source: Ambulatory Visit | Attending: Neurosurgery

## 2019-04-19 ENCOUNTER — Inpatient Hospital Stay (HOSPITAL_COMMUNITY): Payer: Medicare Other | Admitting: Certified Registered Nurse Anesthetist

## 2019-04-19 ENCOUNTER — Encounter (HOSPITAL_COMMUNITY): Payer: Self-pay | Admitting: *Deleted

## 2019-04-19 HISTORY — PX: CRANIOTOMY: SHX93

## 2019-04-19 LAB — POCT I-STAT 7, (LYTES, BLD GAS, ICA,H+H)
Acid-Base Excess: 1 mmol/L (ref 0.0–2.0)
Acid-Base Excess: 2 mmol/L (ref 0.0–2.0)
Acid-Base Excess: 3 mmol/L — ABNORMAL HIGH (ref 0.0–2.0)
Bicarbonate: 26.1 mmol/L (ref 20.0–28.0)
Bicarbonate: 27.3 mmol/L (ref 20.0–28.0)
Bicarbonate: 28.3 mmol/L — ABNORMAL HIGH (ref 20.0–28.0)
Calcium, Ion: 1.23 mmol/L (ref 1.15–1.40)
Calcium, Ion: 1.23 mmol/L (ref 1.15–1.40)
Calcium, Ion: 1.23 mmol/L (ref 1.15–1.40)
HCT: 31 % — ABNORMAL LOW (ref 39.0–52.0)
HCT: 31 % — ABNORMAL LOW (ref 39.0–52.0)
HCT: 31 % — ABNORMAL LOW (ref 39.0–52.0)
Hemoglobin: 10.5 g/dL — ABNORMAL LOW (ref 13.0–17.0)
Hemoglobin: 10.5 g/dL — ABNORMAL LOW (ref 13.0–17.0)
Hemoglobin: 10.5 g/dL — ABNORMAL LOW (ref 13.0–17.0)
O2 Saturation: 100 %
O2 Saturation: 100 %
O2 Saturation: 100 %
Patient temperature: 35.2
Patient temperature: 35.4
Patient temperature: 35.8
Potassium: 3.8 mmol/L (ref 3.5–5.1)
Potassium: 3.8 mmol/L (ref 3.5–5.1)
Potassium: 3.8 mmol/L (ref 3.5–5.1)
Sodium: 139 mmol/L (ref 135–145)
Sodium: 139 mmol/L (ref 135–145)
Sodium: 140 mmol/L (ref 135–145)
TCO2: 27 mmol/L (ref 22–32)
TCO2: 29 mmol/L (ref 22–32)
TCO2: 30 mmol/L (ref 22–32)
pCO2 arterial: 38.4 mmHg (ref 32.0–48.0)
pCO2 arterial: 39.9 mmHg (ref 32.0–48.0)
pCO2 arterial: 45 mmHg (ref 32.0–48.0)
pH, Arterial: 7.402 (ref 7.350–7.450)
pH, Arterial: 7.434 (ref 7.350–7.450)
pH, Arterial: 7.435 (ref 7.350–7.450)
pO2, Arterial: 173 mmHg — ABNORMAL HIGH (ref 83.0–108.0)
pO2, Arterial: 345 mmHg — ABNORMAL HIGH (ref 83.0–108.0)
pO2, Arterial: 419 mmHg — ABNORMAL HIGH (ref 83.0–108.0)

## 2019-04-19 LAB — MRSA PCR SCREENING: MRSA by PCR: NEGATIVE

## 2019-04-19 LAB — BASIC METABOLIC PANEL
Anion gap: 7 (ref 5–15)
BUN: 21 mg/dL (ref 8–23)
CO2: 28 mmol/L (ref 22–32)
Calcium: 9 mg/dL (ref 8.9–10.3)
Chloride: 104 mmol/L (ref 98–111)
Creatinine, Ser: 1.29 mg/dL — ABNORMAL HIGH (ref 0.61–1.24)
GFR calc Af Amer: 59 mL/min — ABNORMAL LOW (ref >60)
GFR calc non Af Amer: 51 mL/min — ABNORMAL LOW (ref >60)
Glucose, Bld: 103 mg/dL — ABNORMAL HIGH (ref 70–99)
Potassium: 4 mmol/L (ref 3.5–5.1)
Sodium: 139 mmol/L (ref 135–145)

## 2019-04-19 SURGERY — CRANIOTOMY HEMATOMA EVACUATION SUBDURAL
Anesthesia: General | Site: Head

## 2019-04-19 MED ORDER — ONDANSETRON HCL 4 MG/2ML IJ SOLN
INTRAMUSCULAR | Status: AC
  Filled 2019-04-19: qty 2

## 2019-04-19 MED ORDER — GLYCOPYRROLATE PF 0.2 MG/ML IJ SOSY
PREFILLED_SYRINGE | INTRAMUSCULAR | Status: DC | PRN
  Administered 2019-04-19: .1 mg via INTRAVENOUS

## 2019-04-19 MED ORDER — DEXAMETHASONE SODIUM PHOSPHATE 10 MG/ML IJ SOLN
INTRAMUSCULAR | Status: DC | PRN
  Administered 2019-04-19: 4 mg via INTRAVENOUS

## 2019-04-19 MED ORDER — SODIUM CHLORIDE 0.9 % IV SOLN
INTRAVENOUS | Status: DC | PRN
  Administered 2019-04-19: 18:00:00 25 ug/min via INTRAVENOUS

## 2019-04-19 MED ORDER — NITROGLYCERIN IN D5W 200-5 MCG/ML-% IV SOLN
INTRAVENOUS | Status: AC
  Filled 2019-04-19: qty 250

## 2019-04-19 MED ORDER — FENTANYL CITRATE (PF) 250 MCG/5ML IJ SOLN
INTRAMUSCULAR | Status: AC
  Filled 2019-04-19: qty 5

## 2019-04-19 MED ORDER — ONDANSETRON HCL 4 MG/2ML IJ SOLN
INTRAMUSCULAR | Status: DC | PRN
  Administered 2019-04-19: 4 mg via INTRAVENOUS

## 2019-04-19 MED ORDER — CEFAZOLIN SODIUM-DEXTROSE 2-4 GM/100ML-% IV SOLN
2.0000 g | INTRAVENOUS | Status: AC
  Administered 2019-04-19: 17:00:00 2 g via INTRAVENOUS
  Filled 2019-04-19: qty 100

## 2019-04-19 MED ORDER — 0.9 % SODIUM CHLORIDE (POUR BTL) OPTIME
TOPICAL | Status: DC | PRN
  Administered 2019-04-19: 18:00:00 1000 mL

## 2019-04-19 MED ORDER — THROMBIN 20000 UNITS EX SOLR
CUTANEOUS | Status: AC
  Filled 2019-04-19: qty 20000

## 2019-04-19 MED ORDER — MORPHINE SULFATE (PF) 4 MG/ML IV SOLN
4.0000 mg | INTRAVENOUS | Status: DC | PRN
  Filled 2019-04-19: qty 1

## 2019-04-19 MED ORDER — SUCCINYLCHOLINE CHLORIDE 200 MG/10ML IV SOSY
PREFILLED_SYRINGE | INTRAVENOUS | Status: AC
  Filled 2019-04-19: qty 10

## 2019-04-19 MED ORDER — LABETALOL HCL 5 MG/ML IV SOLN
INTRAVENOUS | Status: AC
  Filled 2019-04-19: qty 4

## 2019-04-19 MED ORDER — CHLORHEXIDINE GLUCONATE CLOTH 2 % EX PADS
6.0000 | MEDICATED_PAD | Freq: Every day | CUTANEOUS | Status: DC
  Administered 2019-04-19 – 2019-04-20 (×2): 6 via TOPICAL

## 2019-04-19 MED ORDER — HYDROCODONE-ACETAMINOPHEN 5-325 MG PO TABS
1.0000 | ORAL_TABLET | ORAL | Status: DC | PRN
  Administered 2019-04-19 – 2019-04-20 (×3): 2 via ORAL
  Filled 2019-04-19 (×3): qty 2

## 2019-04-19 MED ORDER — HEMOSTATIC AGENTS (NO CHARGE) OPTIME
TOPICAL | Status: DC | PRN
  Administered 2019-04-19: 1 via TOPICAL

## 2019-04-19 MED ORDER — PROPOFOL 10 MG/ML IV BOLUS
INTRAVENOUS | Status: AC
  Filled 2019-04-19: qty 20

## 2019-04-19 MED ORDER — FENTANYL CITRATE (PF) 100 MCG/2ML IJ SOLN
INTRAMUSCULAR | Status: DC | PRN
  Administered 2019-04-19 (×2): 50 ug via INTRAVENOUS

## 2019-04-19 MED ORDER — LABETALOL HCL 5 MG/ML IV SOLN: 5.0000 mg | INTRAVENOUS | Status: DC | PRN

## 2019-04-19 MED ORDER — PHENYLEPHRINE 40 MCG/ML (10ML) SYRINGE FOR IV PUSH (FOR BLOOD PRESSURE SUPPORT)
PREFILLED_SYRINGE | INTRAVENOUS | Status: DC | PRN
  Administered 2019-04-19 (×2): 80 ug via INTRAVENOUS

## 2019-04-19 MED ORDER — LIDOCAINE-EPINEPHRINE 1 %-1:100000 IJ SOLN
INTRAMUSCULAR | Status: DC | PRN
  Administered 2019-04-19: 8 mL

## 2019-04-19 MED ORDER — EPHEDRINE SULFATE-NACL 50-0.9 MG/10ML-% IV SOSY
PREFILLED_SYRINGE | INTRAVENOUS | Status: DC | PRN
  Administered 2019-04-19: 10 mg via INTRAVENOUS
  Administered 2019-04-19: 5 mg via INTRAVENOUS
  Administered 2019-04-19: 10 mg via INTRAVENOUS
  Administered 2019-04-19: 5 mg via INTRAVENOUS

## 2019-04-19 MED ORDER — POTASSIUM CHLORIDE IN NACL 20-0.9 MEQ/L-% IV SOLN
INTRAVENOUS | Status: DC
  Administered 2019-04-19 – 2019-04-20 (×2): via INTRAVENOUS
  Filled 2019-04-19 (×2): qty 1000

## 2019-04-19 MED ORDER — THROMBIN 5000 UNITS EX SOLR
CUTANEOUS | Status: AC
  Filled 2019-04-19: qty 5000

## 2019-04-19 MED ORDER — METHYLENE BLUE 0.5 % INJ SOLN
INTRAVENOUS | Status: AC
  Filled 2019-04-19: qty 10

## 2019-04-19 MED ORDER — DEXAMETHASONE SODIUM PHOSPHATE 10 MG/ML IJ SOLN
INTRAMUSCULAR | Status: AC
  Filled 2019-04-19: qty 1

## 2019-04-19 MED ORDER — SODIUM CHLORIDE 0.9 % IV SOLN
INTRAVENOUS | Status: DC | PRN
  Administered 2019-04-19: 18:00:00 500 mL

## 2019-04-19 MED ORDER — SODIUM CHLORIDE 0.9 % IV SOLN
INTRAVENOUS | Status: DC | PRN
  Administered 2019-04-19: 17:00:00 via INTRAVENOUS

## 2019-04-19 MED ORDER — METHYLENE BLUE 0.5 % INJ SOLN
INTRAVENOUS | Status: DC | PRN
  Administered 2019-04-19: 1 mL

## 2019-04-19 MED ORDER — ROCURONIUM BROMIDE 10 MG/ML (PF) SYRINGE
PREFILLED_SYRINGE | INTRAVENOUS | Status: DC | PRN
  Administered 2019-04-19: 10 mg via INTRAVENOUS
  Administered 2019-04-19: 50 mg via INTRAVENOUS

## 2019-04-19 MED ORDER — LABETALOL HCL 5 MG/ML IV SOLN
INTRAVENOUS | Status: DC | PRN
  Administered 2019-04-19: 2.5 mg via INTRAVENOUS
  Administered 2019-04-19 (×2): 5 mg via INTRAVENOUS
  Administered 2019-04-19: 2.5 mg via INTRAVENOUS

## 2019-04-19 MED ORDER — HYDRALAZINE HCL 20 MG/ML IJ SOLN: 5.0000 mg | INTRAMUSCULAR | Status: DC | PRN

## 2019-04-19 MED ORDER — BUPIVACAINE HCL (PF) 0.5 % IJ SOLN
INTRAMUSCULAR | Status: AC
  Filled 2019-04-19: qty 30

## 2019-04-19 MED ORDER — LIDOCAINE-EPINEPHRINE 1 %-1:100000 IJ SOLN
INTRAMUSCULAR | Status: AC
  Filled 2019-04-19: qty 1

## 2019-04-19 MED ORDER — SODIUM CHLORIDE 0.9 % IV SOLN
INTRAVENOUS | Status: DC | PRN
  Administered 2019-04-19 (×2): via INTRAVENOUS

## 2019-04-19 MED ORDER — LIDOCAINE 2% (20 MG/ML) 5 ML SYRINGE
INTRAMUSCULAR | Status: DC | PRN
  Administered 2019-04-19: 30 mg via INTRAVENOUS

## 2019-04-19 MED ORDER — LIDOCAINE 2% (20 MG/ML) 5 ML SYRINGE
INTRAMUSCULAR | Status: AC
  Filled 2019-04-19: qty 5

## 2019-04-19 MED ORDER — THROMBIN 20000 UNITS EX SOLR
CUTANEOUS | Status: DC | PRN
  Administered 2019-04-19: 19:00:00 20 mL via TOPICAL

## 2019-04-19 MED ORDER — PROPOFOL 10 MG/ML IV BOLUS
INTRAVENOUS | Status: DC | PRN
  Administered 2019-04-19: 20 mg via INTRAVENOUS
  Administered 2019-04-19: 100 mg via INTRAVENOUS
  Administered 2019-04-19: 30 mg via INTRAVENOUS

## 2019-04-19 MED ORDER — NITROGLYCERIN 0.2 MG/ML ON CALL CATH LAB
INTRAVENOUS | Status: DC | PRN
  Administered 2019-04-19: 20 ug via INTRAVENOUS
  Administered 2019-04-19: 40 ug via INTRAVENOUS
  Administered 2019-04-19 (×5): 20 ug via INTRAVENOUS
  Administered 2019-04-19: 40 ug via INTRAVENOUS
  Administered 2019-04-19: 20 ug via INTRAVENOUS

## 2019-04-19 MED ORDER — SODIUM CHLORIDE 0.9 % IV SOLN
INTRAVENOUS | Status: DC
  Administered 2019-04-19: 14:00:00 via INTRAVENOUS

## 2019-04-19 MED ORDER — BUPIVACAINE HCL (PF) 0.25 % IJ SOLN
INTRAMUSCULAR | Status: DC | PRN
  Administered 2019-04-19: 9 mL

## 2019-04-19 MED ORDER — SUGAMMADEX SODIUM 200 MG/2ML IV SOLN
INTRAVENOUS | Status: DC | PRN
  Administered 2019-04-19: 200 mg via INTRAVENOUS
  Administered 2019-04-19: 100 mg via INTRAVENOUS

## 2019-04-19 SURGICAL SUPPLY — 63 items
APPLICATOR COTTON TIP 6 STRL (MISCELLANEOUS) ×1 IMPLANT
APPLICATOR COTTON TIP 6IN STRL (MISCELLANEOUS) ×2
BAG DECANTER FOR FLEXI CONT (MISCELLANEOUS) ×2 IMPLANT
BIT DRILL WIRE PASS 1.3MM (BIT) IMPLANT
BNDG GAUZE ELAST 4 BULKY (GAUZE/BANDAGES/DRESSINGS) ×2 IMPLANT
BNDG STRETCH 4X75 NS LF (GAUZE/BANDAGES/DRESSINGS) ×2 IMPLANT
BNDG STRETCH 4X75 STRL LF (GAUZE/BANDAGES/DRESSINGS) ×2 IMPLANT
BUR ACORN 6.0 PRECISION (BURR) ×2 IMPLANT
BUR SPIRAL ROUTER 2.3 (BUR) ×2 IMPLANT
CANISTER SUCT 3000ML PPV (MISCELLANEOUS) ×2 IMPLANT
CARTRIDGE OIL MAESTRO DRILL (MISCELLANEOUS) ×1 IMPLANT
CLIP VESOCCLUDE MED 6/CT (CLIP) ×4 IMPLANT
DIFFUSER DRILL AIR PNEUMATIC (MISCELLANEOUS) ×2 IMPLANT
DRAIN JACKSON PRATT 10MM FLAT (MISCELLANEOUS) ×2 IMPLANT
DRAIN PENROSE 1/2X12 LTX STRL (WOUND CARE) IMPLANT
DRAPE NEUROLOGICAL W/INCISE (DRAPES) ×2 IMPLANT
DRAPE SURG 17X23 STRL (DRAPES) IMPLANT
DRAPE WARM FLUID 44X44 (DRAPES) ×2 IMPLANT
DRILL WIRE PASS 1.3MM (BIT)
DRSG ADAPTIC 3X8 NADH LF (GAUZE/BANDAGES/DRESSINGS) ×2 IMPLANT
ELECT REM PT RETURN 9FT ADLT (ELECTROSURGICAL) ×2
ELECTRODE REM PT RTRN 9FT ADLT (ELECTROSURGICAL) ×1 IMPLANT
EVACUATOR SILICONE 100CC (DRAIN) ×2 IMPLANT
GAUZE 4X4 16PLY RFD (DISPOSABLE) IMPLANT
GAUZE SPONGE 4X4 12PLY STRL (GAUZE/BANDAGES/DRESSINGS) ×2 IMPLANT
GLOVE BIOGEL PI IND STRL 6.5 (GLOVE) ×2 IMPLANT
GLOVE BIOGEL PI IND STRL 7.0 (GLOVE) ×2 IMPLANT
GLOVE BIOGEL PI IND STRL 7.5 (GLOVE) ×2 IMPLANT
GLOVE BIOGEL PI IND STRL 8 (GLOVE) ×2 IMPLANT
GLOVE BIOGEL PI INDICATOR 6.5 (GLOVE) ×2
GLOVE BIOGEL PI INDICATOR 7.0 (GLOVE) ×2
GLOVE BIOGEL PI INDICATOR 7.5 (GLOVE) ×2
GLOVE BIOGEL PI INDICATOR 8 (GLOVE) ×2
GLOVE ECLIPSE 7.5 STRL STRAW (GLOVE) ×4 IMPLANT
GOWN STRL REUS W/ TWL LRG LVL3 (GOWN DISPOSABLE) ×1 IMPLANT
GOWN STRL REUS W/ TWL XL LVL3 (GOWN DISPOSABLE) ×3 IMPLANT
GOWN STRL REUS W/TWL 2XL LVL3 (GOWN DISPOSABLE) IMPLANT
GOWN STRL REUS W/TWL LRG LVL3 (GOWN DISPOSABLE) ×1
GOWN STRL REUS W/TWL XL LVL3 (GOWN DISPOSABLE) ×3
GRAFT DURAGEN MATRIX 2WX2L ×2 IMPLANT
HEMOSTAT SURGICEL 2X14 (HEMOSTASIS) ×2 IMPLANT
KIT BASIN OR (CUSTOM PROCEDURE TRAY) ×2 IMPLANT
KIT TURNOVER KIT B (KITS) ×2 IMPLANT
NEEDLE SPNL 22GX3.5 QUINCKE BK (NEEDLE) ×2 IMPLANT
NS IRRIG 1000ML POUR BTL (IV SOLUTION) ×2 IMPLANT
OIL CARTRIDGE MAESTRO DRILL (MISCELLANEOUS) ×2
PACK CRANIOTOMY CUSTOM (CUSTOM PROCEDURE TRAY) ×2 IMPLANT
PAD ARMBOARD 7.5X6 YLW CONV (MISCELLANEOUS) ×2 IMPLANT
PATTIES SURGICAL 1X1 (DISPOSABLE) IMPLANT
PLATE 1.5  2HOLE MED NEURO (Plate) ×1 IMPLANT
PLATE 1.5 2HOLE MED NEURO (Plate) ×1 IMPLANT
PLATE 1.5 5HOLE SQUARE (Plate) ×2 IMPLANT
PLATE DOUBLE 6 HOLE (Plate) ×2 IMPLANT
SCREW SELF DRILL HT 1.5/4MM (Screw) ×20 IMPLANT
SPONGE SURGIFOAM ABS GEL 100 (HEMOSTASIS) ×2 IMPLANT
STAPLER SKIN PROX WIDE 3.9 (STAPLE) ×2 IMPLANT
SUT ETHILON 3 0 FSL (SUTURE) ×2 IMPLANT
SUT NURALON 4 0 TR CR/8 (SUTURE) ×4 IMPLANT
SUT VIC AB 2-0 CP2 18 (SUTURE) ×4 IMPLANT
TOWEL GREEN STERILE (TOWEL DISPOSABLE) ×2 IMPLANT
TOWEL GREEN STERILE FF (TOWEL DISPOSABLE) ×2 IMPLANT
TRAY FOLEY MTR SLVR 16FR STAT (SET/KITS/TRAYS/PACK) ×2 IMPLANT
WATER STERILE IRR 1000ML POUR (IV SOLUTION) ×2 IMPLANT

## 2019-04-19 NOTE — Progress Notes (Signed)
Patient's Arterial Line has a poor waveform with whip.  Will judge patient's BP requirements (sys <160) using his BP cuff.  Will remove in the morning.

## 2019-04-19 NOTE — Progress Notes (Signed)
Subjective: Patient resting comfortably in PACU.  Without complaints.  Objective: Vital signs in last 24 hours: Vitals:   04/19/19 1419 04/19/19 1906 04/19/19 1915 04/19/19 1930  BP:  116/62 116/62   Pulse:  60 60 (!) 57  Resp:  18 18 15   Temp:  97.7 F (36.5 C)    TempSrc:      SpO2:  100% 98% 100%  Weight: 87.1 kg     Height: 5' 7.5" (1.715 m)       Intake/Output from previous day: 08/25 0701 - 08/26 0700 In: 240 [P.O.:240] Out: 3200 [Urine:3200] Intake/Output this shift: No intake/output data recorded.  Physical Exam: Awake and alert, following commands.  Moving all 4 extremities.  No significant drainage in Jackson-Pratt drain yet.  Dressing clean and dry.  CBC Recent Labs    04/17/19 1924  04/19/19 1747 04/19/19 1822  WBC 7.2  --   --   --   HGB 11.0*   < > 10.5* 10.5*  HCT 34.6*   < > 31.0* 31.0*  PLT 232  --   --   --    < > = values in this interval not displayed.   BMET Recent Labs    04/17/19 1924 04/19/19 0400  04/19/19 1747 04/19/19 1822  NA 136 139   < > 139 140  K 4.3 4.0   < > 3.8 3.8  CL 99 104  --   --   --   CO2 27 28  --   --   --   GLUCOSE 96 103*  --   --   --   BUN 26* 21  --   --   --   CREATININE 1.22 1.29*  --   --   --   CALCIUM 9.1 9.0  --   --   --    < > = values in this interval not displayed.    Assessment/Plan: Doing well following surgery.  For transfer to ICU for postoperative care.  If doing well in morning, will resume PT and OT.  Hosie Spangle, MD 04/19/2019, 7:47 PM

## 2019-04-19 NOTE — Anesthesia Procedure Notes (Signed)
Arterial Line Insertion Start/End8/26/2020 2:40 PM, 04/19/2019 2:40 PM Performed by: CRNA  Patient location: Pre-op. Preanesthetic checklist: patient identified, IV checked, site marked, risks and benefits discussed, surgical consent, monitors and equipment checked, pre-op evaluation, timeout performed and anesthesia consent Lidocaine 1% used for infiltration radial was placed Catheter size: 20 G Hand hygiene performed , maximum sterile barriers used  and Seldinger technique used Allen's test indicative of satisfactory collateral circulation Attempts: 1 Procedure performed without using ultrasound guided technique. Following insertion, dressing applied and Biopatch. Post procedure assessment: normal  Patient tolerated the procedure well with no immediate complications.

## 2019-04-19 NOTE — Progress Notes (Signed)
Subjective: Patient resting in bed in preop area.  Comfortable.  No new complaints.  Nursing staff last night noted that when they got him up from the bed, he was two person assist, and was quite unsteady.  Objective: Vital signs in last 24 hours: Vitals:   04/19/19 0318 04/19/19 0801 04/19/19 1201 04/19/19 1419  BP: 129/61 125/63 138/67   Pulse: 66 61 62   Resp: 18 16 16    Temp: 98 F (36.7 C) 97.8 F (36.6 C) 97.7 F (36.5 C)   TempSrc: Oral Oral Oral   SpO2: 98% 99% 98%   Weight:    87.1 kg  Height:    5' 7.5" (1.715 m)    Intake/Output from previous day: 08/25 0701 - 08/26 0700 In: 240 [P.O.:240] Out: 3200 [Urine:3200] Intake/Output this shift: No intake/output data recorded.  Physical Exam: Awake and alert, following commands.  Mild left hemiparesis.  CBC Recent Labs    04/17/19 1924  WBC 7.2  HGB 11.0*  HCT 34.6*  PLT 232   BMET Recent Labs    04/17/19 1924 04/19/19 0400  NA 136 139  K 4.3 4.0  CL 99 104  CO2 27 28  GLUCOSE 96 103*  BUN 26* 21  CREATININE 1.22 1.29*  CALCIUM 9.1 9.0    Studies/Results: Benjamin Hahn Contrast  Result Date: 04/17/2019 CLINICAL DATA:  Bilateral arm and leg weakness for 2 weeks. Confusion. EXAM: MRI HEAD WITHOUT AND WITH CONTRAST TECHNIQUE: Multiplanar, multiecho pulse sequences of the brain and surrounding structures were obtained without and with intravenous contrast. CONTRAST:  99mL MULTIHANCE GADOBENATE DIMEGLUMINE 529 MG/ML IV SOLN COMPARISON:  Head CT 06/12/2013 and MRI 06/06/2013 FINDINGS: Brain: A large, heterogeneously T1 and T2 hyperintense subdural hematoma over the right cerebral convexity measures up to 2.1 cm in thickness. Associated smooth dural enhancement is likely reactive. There is right cerebral hemispheric sulcal effacement with mass effect on the right lateral ventricle and 7 mm of leftward midline shift. Small foci of T2 hyperintensity in the cerebral white matter bilaterally are nonspecific but  compatible with mild chronic small vessel ischemic disease, slightly progressed from 2014. Multiple small chronic cerebellar infarcts are again seen. There is moderate cerebral atrophy. No acute infarct is evident. Vascular: Major intracranial vascular flow voids are preserved. Skull and upper cervical spine: Unremarkable bone marrow signal. Sinuses/Orbits: Bilateral cataract extraction. Small right maxillary sinus with mild mucosal thickening and/or small volume fluid. Clear mastoid air cells. Other: None. IMPRESSION: 1. Large right cerebral convexity subdural hematoma with 7 mm of leftward midline shift. 2. Mild chronic small vessel ischemic disease. 3. Chronic cerebellar infarcts. Critical Value/emergent results were called by telephone at the time of interpretation on 04/17/2019 at 5:57 p.m. to Dr. Derrel Nip (on-call for Dr. Quay Burow), who verbally acknowledged these results and asked that the patient be directed to the [facility] Emergency Department for further evaluation. Electronically Signed   By: Logan Bores M.D.   On: 04/17/2019 18:12    Assessment/Plan: For craniotomy for evacuation of subdural hematoma.  Spoke at length with patient his wife at his bedside last night, and reviewed the MRI images with his wife.  Discussed recommendation for craniotomy for evacuation of subdural hematoma, including risks of surgery and risk of recurrence and possible need for further surgery.  We discussed that patient will likely need rehabilitation postoperatively due to weakness, deconditioning, imbalance and unsteadiness, etc.  We will plan on resuming PT and OT once sufficiently recovered from craniotomy.  Hosie Spangle,  MD 04/19/2019, 3:51 PM

## 2019-04-19 NOTE — Op Note (Signed)
04/19/2019  7:00 PM  PATIENT:  Benjamin Hahn  85 y.o. male  PRE-OPERATIVE DIAGNOSIS: Right hemispheric subacute and chronic subdural hematoma with left hemiparesis  POST-OPERATIVE DIAGNOSIS:  Right hemispheric subacute and chronic subdural hematoma with left hemiparesis  PROCEDURE:  Procedure(s): Right frontoparietaltemporal craniotomy and evacuation of subdural hematoma  SURGEON: Jovita Gamma, MD  ASSISTANTS: Kary Kos, MD  ANESTHESIA:   general  EBL:  Total I/O In: 1300 [I.V.:1300] Out: 850 [Urine:750; Blood:100]  BLOOD ADMINISTERED:none  COUNT: Correct per nursing staff  DRAINS: (10 mm) Jackson-Pratt drain(s) with closed bulb suction in the Subdural space   DICTATION: Patient was brought the operating room, placed under general endotracheal anesthesia.  The patient had a roll placed behind the right shoulder was gently turned to the left, and his head was supported on a doughnut headrest.  The right side of the scalp was shaved with electric clippers, and then prepped with Betadine soap and solution and draped in a sterile fashion.  A right parasagittal incision incision was made from the frontal boss to the parietal boss.  The line of the incision was infiltrated local anesthetic with epinephrine Raney clips were applied to the scalp edges to maintain hemostasis.  The superior aspect of the temporalis fascia and muscle were elevated, and self-retaining retractors placed.  A single bur hole was made just behind the inferior aspect of the coronal suture, and the dura was dissected from the overlying skull.  The craniotome attachment was used to turn a bone flap.  The dura was tacked up around the margins of the craniotomy.  The dura was opened in a U-shaped fashion, hinged towards the midline.  We identified a parietal subdural membrane, that was opened in a U-shaped fashion, hinged towards the midline.  A large mixed density subdural hematoma was encountered.  A large portion  was dark amber chronic liquid subdural hematoma and a another portion was dark maroon clotted subdural blood that was beginning to break down.  This was irrigated off of the brain surface with warm saline until clear.  We then mobilized the visceral subdural membrane.  The parietal and subdural membranes were coagulated and divided, and the subdural space fully irrigated with warm saline solution until clear.  We then placed a 10 mm flat Jackson-Pratt drain in the subdural space.  It was brought out through a separate stab incision and connected to a closed collection system.  The dura was closed with interrupted 4-0 Nurolon sutures.  DuraGen was used as a patch for the dural gaps.  The bone flap was secured to the skull with Lorenz cranial plates and screws.  The scalp was closed in layers.  The galea was closed with interrupted inverted 2 oh and Vicryl sutures.  The skin is approximated surgical staples.  The drain was sutured to the scalp with a 3-0 nylon suture.  The wound was dressed with Adaptic sterile gauze and wrapped with 2 Kerlix.  Following surgery the patient was to be reversed in anesthetic, extubated, and transferred to the recovery room for further care.  PLAN OF CARE: Patient was an inpatient, and is to be transferred to the neurosurgery ICU following initial care in the recovery room.  PATIENT DISPOSITION:  PACU - hemodynamically stable.   Delay start of Pharmacological VTE agent (>24hrs) due to surgical blood loss or risk of bleeding:  yes

## 2019-04-19 NOTE — Anesthesia Postprocedure Evaluation (Signed)
Anesthesia Post Note  Patient: Benjamin Hahn  Procedure(s) Performed: CRANIOTOMY HEMATOMA EVACUATION SUBDURAL (N/A Head)     Patient location during evaluation: PACU Anesthesia Type: General Level of consciousness: awake and alert Pain management: pain level controlled Vital Signs Assessment: post-procedure vital signs reviewed and stable Respiratory status: spontaneous breathing, nonlabored ventilation, respiratory function stable and patient connected to nasal cannula oxygen Cardiovascular status: blood pressure returned to baseline and stable Postop Assessment: no apparent nausea or vomiting Anesthetic complications: no    Last Vitals:  Vitals:   04/19/19 2000 04/19/19 2002  BP: 130/62   Pulse: 64 61  Resp: 13 14  Temp: 36.6 C   SpO2: 98% 98%    Last Pain:  Vitals:   04/19/19 2000  TempSrc: Oral  PainSc: 6                  Tiajuana Amass

## 2019-04-19 NOTE — Evaluation (Signed)
Occupational Therapy Evaluation Patient Details Name: Benjamin Hahn MRN: [id number] DOB: [birth year] Today's Date: 04/19/2019    History of Present Illness Pt is an 85 y/o male admitted secondary to multiple falls. Found to have Large R subdural hematoma and will likely have surgery during admission for evacuation of hematoma. PMH includes arthrits and temporal artertitis.    Clinical Impression   Pt PTA: living with spouse and independent; pt was still working as a Chief Executive Officer. Pt currently performing ADL tasks with minguardA and minA overall for safety and stability for LB in standing. Pt performing mobility with small, shuffling steps with RW requiring verbal cues to assist with step length. Pt performing UB ADL with supervisionA standing at sink. Pt ambulating 75' x2 in hallway with RW minguardA to minA to avoid obstacles on L side. Pt's spouse came in during session and wanted to see pt walk so pt ambulating 15' x4 times in room with minguardA. Pt tolerating session well. Pt has improved immensely since yesterday's PT eval. Pt with deficits in strength and activity tolerance. Pt would benefit from continued OT skilled services for ADL and mobility. OT following acutely and to re-assess after surgery.    Follow Up Recommendations  Home health OT;Supervision/Assistance - 24 hour(initially 24/7)    Equipment Recommendations  None recommended by OT    Recommendations for Other Services       Precautions / Restrictions Precautions Precautions: Fall Restrictions Weight Bearing Restrictions: No      Mobility Bed Mobility Overal bed mobility: Needs Assistance Bed Mobility: Supine to Sit;Sit to Supine     Supine to sit: Min assist Sit to supine: Min guard   General bed mobility comments: minA for trunk elevation and to scoot towards EOB; sitting to supine minguardA required and cues for repositioning hips in bed  Transfers Overall transfer level: Needs assistance Equipment used:  Rolling walker (2 wheeled) Transfers: Sit to/from Stand Sit to Stand: Min guard         General transfer comment: MinguardA for stability with initital standing balance    Balance Overall balance assessment: Needs assistance Sitting-balance support: No upper extremity supported;Feet supported Sitting balance-Leahy Scale: Fair     Standing balance support: Bilateral upper extremity supported;During functional activity Standing balance-Leahy Scale: Poor Standing balance comment: Reliant on BUE and external support                           ADL either performed or assessed with clinical judgement   ADL Overall ADL's : Needs assistance/impaired Eating/Feeding: NPO   Grooming: Min guard;Wash/dry hands;Wash/dry face;Oral care;Brushing hair;Standing   Upper Body Bathing: Set up;Sitting   Lower Body Bathing: Min guard;Sitting/lateral leans;Sit to/from stand   Upper Body Dressing : Set up;Sitting   Lower Body Dressing: Min guard;Sitting/lateral leans;Sit to/from stand;Cueing for safety   Toilet Transfer: Retail buyer and Hygiene: Minimal assistance;Sitting/lateral lean;Sit to/from stand;Cueing for safety       Functional mobility during ADLs: Min guard;Minimal assistance;Rolling walker;Cueing for safety;Cueing for sequencing General ADL Comments: Pt minA overall for ADL. Pt donning socks at EOB and requiring assist for proper toilet hygiene. Pt stood at sink for ADL with minguardA.     Vision Baseline Vision/History: Wears glasses Wears Glasses: Reading only Patient Visual Report: No change from baseline Vision Assessment?: No apparent visual deficits     Perception     Praxis      Pertinent Vitals/Pain Pain Assessment:  No/denies pain     Hand Dominance Right   Extremity/Trunk Assessment Upper Extremity Assessment Upper Extremity Assessment: Generalized weakness   Lower Extremity Assessment Lower  Extremity Assessment: Defer to PT evaluation;Generalized weakness   Cervical / Trunk Assessment Cervical / Trunk Assessment: Kyphotic   Communication Communication Communication: No difficulties   Cognition Arousal/Alertness: Awake/alert Behavior During Therapy: WFL for tasks assessed/performed Overall Cognitive Status: Impaired/Different from baseline Area of Impairment: Problem solving                             Problem Solving: Slow processing General Comments: Pt not able to fully move around obstacles when asked to maneuver through hallway. Pt bumping into items on L side.    General Comments  Pt's spouse present to conclude session    Exercises     Shoulder Instructions      Home Living Family/patient expects to be discharged to:: Private residence Living Arrangements: Spouse/significant other Available Help at Discharge: Family Type of Home: House Home Access: Level entry     Home Layout: Two level;Able to live on main level with bedroom/bathroom     Bathroom Shower/Tub: Occupational psychologist: Handicapped height     Home Equipment: Environmental consultant - 4 wheels          Prior Functioning/Environment Level of Independence: Independent with assistive device(s)        Comments: was using rollator for mobility but had multiple falls        OT Problem List: Decreased strength;Impaired balance (sitting and/or standing);Decreased activity tolerance;Decreased coordination;Decreased cognition      OT Treatment/Interventions: Self-care/ADL training;Therapeutic exercise;Neuromuscular education;Therapeutic activities;Patient/family education;Balance training    OT Goals(Current goals can be found in the care plan section) Acute Rehab OT Goals Patient Stated Goal: to get home OT Goal Formulation: With patient Time For Goal Achievement: 05/03/19 Potential to Achieve Goals: Good ADL Goals Pt Will Perform Grooming: with modified  independence;standing Pt Will Perform Lower Body Dressing: with modified independence;sitting/lateral leans;sit to/from stand Pt Will Transfer to Toilet: with modified independence;regular height toilet Pt Will Perform Toileting - Clothing Manipulation and hygiene: with modified independence;sitting/lateral leans;sit to/from stand Additional ADL Goal #1: Pt will increase to modified independence with ADL tasks.  OT Frequency: Min 2X/week   Barriers to D/C:            Co-evaluation              AM-PAC OT "6 Clicks" Daily Activity     Outcome Measure Help from another person eating meals?: None Help from another person taking care of personal grooming?: A Little Help from another person toileting, which includes using toliet, bedpan, or urinal?: A Little Help from another person bathing (including washing, rinsing, drying)?: A Little Help from another person to put on and taking off regular upper body clothing?: None Help from another person to put on and taking off regular lower body clothing?: A Little 6 Click Score: 20   End of Session Equipment Utilized During Treatment: Gait belt;Rolling walker Nurse Communication: Mobility status  Activity Tolerance: Patient tolerated treatment well Patient left: in bed;with call bell/phone within reach;with bed alarm set  OT Visit Diagnosis: Unsteadiness on feet (R26.81);Muscle weakness (generalized) (M62.81)                Time: [id number] OT Time Calculation (min): 45 min Charges:  OT General Charges $OT Visit: 1 Visit OT Evaluation $OT Eval Moderate Complexity:  1 Mod OT Treatments $Self Care/Home Management : 8-22 mins $Neuromuscular Re-education: 8-22 mins  Darryl Nestle) Marsa Aris OTR/L Acute Rehabilitation Services Pager: [phone] Office: [place] 04/19/2019, 1:16 PM

## 2019-04-19 NOTE — Transfer of Care (Signed)
Immediate Anesthesia Transfer of Care Note  Patient: Benjamin Hahn  Procedure(s) Performed: CRANIOTOMY HEMATOMA EVACUATION SUBDURAL (N/A Head)  Patient Location: PACU  Anesthesia Type:General  Level of Consciousness: awake, alert  and oriented  Airway & Oxygen Therapy: Patient Spontanous Breathing and Patient connected to face mask oxygen  Post-op Assessment: Report given to RN and Post -op Vital signs reviewed and stable  Post vital signs: Reviewed and stable  Last Vitals:  Vitals Value Taken Time  BP 141/43 ABP 04/19/19 1910  Temp 97.32F   Pulse 60 04/19/19 1910  Resp 16 04/19/19 1912  SpO2 100 % 04/19/19 1910  Vitals shown include unvalidated device data.  Last Pain:  Vitals:   04/19/19 1201  TempSrc: Oral  PainSc:      Report to Margie Ege MD at bedside.    Complications: No apparent anesthesia complications

## 2019-04-19 NOTE — Anesthesia Procedure Notes (Addendum)
Procedure Name: Intubation Date/Time: 04/19/2019 5:14 PM Performed by: Jearld Pies, CRNA Pre-anesthesia Checklist: Patient identified, Emergency Drugs available, Suction available and Patient being monitored Patient Re-evaluated:Patient Re-evaluated prior to induction Oxygen Delivery Method: Circle System Utilized Preoxygenation: Pre-oxygenation with 100% oxygen Induction Type: IV induction Ventilation: Mask ventilation without difficulty and Oral airway inserted - appropriate to patient size Laryngoscope Size: Mac and 4 Grade View: Grade I Tube type: Oral Tube size: 8.0 mm Number of attempts: 1 Airway Equipment and Method: Stylet and Oral airway Placement Confirmation: ETT inserted through vocal cords under direct vision,  positive ETCO2 and breath sounds checked- equal and bilateral Secured at: 22 cm Tube secured with: Tape Dental Injury: Teeth and Oropharynx as per pre-operative assessment

## 2019-04-19 NOTE — Progress Notes (Signed)
Patient's belongings upon admission to 4N19 was a pair of shoes and clothing which includes a shirt and pants.

## 2019-04-19 NOTE — Anesthesia Preprocedure Evaluation (Addendum)
Anesthesia Evaluation  Patient identified by MRN, date of birth, ID band Patient awake    Reviewed: Allergy & Precautions, NPO status , Patient's Chart, lab work & pertinent test results, reviewed documented beta blocker date and time   History of Anesthesia Complications Negative for: history of anesthetic complications  Airway Mallampati: II  TM Distance: >3 FB Neck ROM: Full    Dental  (+) Dental Advisory Given   Pulmonary former smoker,  04/17/2019 SARS coronavirus NEG   breath sounds clear to auscultation       Cardiovascular hypertension, Pt. on medications and Pt. on home beta blockers (-) angina Rhythm:Regular Rate:Normal  '17 Cath: Prox RCA to Mid RCA lesion, 30% stenosed. 1st Mrg lesion, 60% stenosed. Prox Cx lesion, 60% stenosed. Mid RCA lesion, 90% stenosed. Post intervention, there is a 0% residual stenosis. The lesion was not previously treated. Prox LAD to Mid LAD lesion, 80% stenosed. Post intervention, there is a 0% residual stenosis.There is moderate left ventricular systolic dysfunction.     Neuro/Psych Anxiety Depression Gait instability: large SDH    GI/Hepatic Neg liver ROS, GERD  Controlled,  Endo/Other  Hypothyroidism   Renal/GU Renal InsufficiencyRenal disease (creat 1.29)     Musculoskeletal   Abdominal   Peds  Hematology negative hematology ROS (+)   Anesthesia Other Findings   Reproductive/Obstetrics                            Anesthesia Physical Anesthesia Plan  ASA: III  Anesthesia Plan: General   Post-op Pain Management:    Induction: Intravenous  PONV Risk Score and Plan: 2 and Ondansetron, Dexamethasone and Treatment may vary due to age or medical condition  Airway Management Planned: Oral ETT  Additional Equipment: Arterial line  Intra-op Plan:   Post-operative Plan: Possible Post-op intubation/ventilation  Informed Consent: I have reviewed  the patients History and Physical, chart, labs and discussed the procedure including the risks, benefits and alternatives for the proposed anesthesia with the patient or authorized representative who has indicated his/her understanding and acceptance.     Dental advisory given  Plan Discussed with: CRNA and Surgeon  Anesthesia Plan Comments:         Anesthesia Quick Evaluation

## 2019-04-20 ENCOUNTER — Encounter (HOSPITAL_COMMUNITY): Payer: Self-pay | Admitting: Neurosurgery

## 2019-04-20 MED ORDER — CALCIUM CARBONATE ANTACID 500 MG PO CHEW
1.0000 | CHEWABLE_TABLET | Freq: Every day | ORAL | Status: DC | PRN
  Administered 2019-04-20: 200 mg via ORAL
  Filled 2019-04-20: qty 1

## 2019-04-20 MED ORDER — ONDANSETRON HCL 4 MG PO TABS
8.0000 mg | ORAL_TABLET | Freq: Three times a day (TID) | ORAL | Status: DC | PRN
  Administered 2019-04-20: 8 mg via ORAL
  Filled 2019-04-20: qty 2

## 2019-04-20 MED ORDER — ACETAMINOPHEN 325 MG PO TABS
325.0000 mg | ORAL_TABLET | ORAL | Status: DC | PRN
  Administered 2019-04-21: 650 mg via ORAL
  Filled 2019-04-20: qty 2

## 2019-04-20 MED ORDER — LIDOCAINE HCL URETHRAL/MUCOSAL 2 % EX GEL
1.0000 "application " | Freq: Once | CUTANEOUS | Status: AC
  Administered 2019-04-20: 1 via URETHRAL
  Filled 2019-04-20: qty 20

## 2019-04-20 NOTE — Progress Notes (Signed)
Patient does not have the urge to urinate. He was bladder scanned with a result of 497cc. He does not want to an I&O at this time. He is not experiencing any discomfort. He would like to try to stand up in a little while and try to use the urinal. Thayer Ohm D

## 2019-04-20 NOTE — Evaluation (Addendum)
Physical Therapy Re-Evaluation Patient Details Name: Benjamin Hahn MRN: [id number] DOB: 01-14-1933 Today's Date: 04/20/2019   History of Present Illness  Pt is an 85 y/o male admitted secondary to multiple falls. Found to have Large R subdural hematoma. S/p right frontoparietaltemporal craniotomy and evacuation of subdural hematoma 8/26. PMH includes arthrits and temporal artertitis.   Clinical Impression  Pt re-evaluated s/p procedure above. Pt reporting good pain control, eager to get out of bed with therapy. Pt requiring min assist for mobility. Ambulating 90 feet with use of walker. Presents with decreased functional mobility secondary to decreased gait speed, poor sitting and standing balance, abnormal posture, and decreased problem solving skills. Still presents as high fall risk based on decreased gait speed, safety awareness, recurrent falls. Would still recommend CIR to maximize functional independence and decrease caregiver burden. Goals updated.  I have discussed the patient's current level of function related to subdural hematoma s/p craniotomy with the patient and patient wife.  They acknowledge understanding of this and do not feel the patient would be able to have their care needs met at home.  They are interested in post-acute rehab in an inpatient setting.       Follow Up Recommendations CIR; 24/7 Supervision    Equipment Recommendations  None recommended by PT    Recommendations for Other Services       Precautions / Restrictions Precautions Precautions: Fall;Other (comment) Precaution Comments: drain Restrictions Weight Bearing Restrictions: No      Mobility  Bed Mobility Overal bed mobility: Needs Assistance Bed Mobility: Supine to Sit     Supine to sit: Min assist     General bed mobility comments: minA to scoot hips forward using bed pad, pt with initial posterior lean without foot support  Transfers Overall transfer level: Needs  assistance Equipment used: Rolling walker (2 wheeled) Transfers: Sit to/from Stand Sit to Stand: Min assist         General transfer comment: Pt requiring min assist to boost up from bed, cues for hand placement  Ambulation/Gait Ambulation/Gait assistance: Min assist Gait Distance (Feet): 90 Feet Assistive device: Rolling walker (2 wheeled) Gait Pattern/deviations: Step-through pattern;Decreased stride length;Trunk flexed Gait velocity: decreased   General Gait Details: Slow, speed, decreased bilateral foot clearance. Cues for walker proximity, pt unable to significantly correct  Stairs            Wheelchair Mobility    Modified Rankin (Stroke Patients Only) Modified Rankin (Stroke Patients Only) Pre-Morbid Rankin Score: Moderate disability Modified Rankin: Moderately severe disability     Balance Overall balance assessment: Needs assistance Sitting-balance support: No upper extremity supported;Feet supported Sitting balance-Leahy Scale: Fair Sitting balance - Comments: Supervision for safety, initial posterior lean with feet unsupported   Standing balance support: Bilateral upper extremity supported;During functional activity Standing balance-Leahy Scale: Poor Standing balance comment: Reliant on BUE and external support                             Pertinent Vitals/Pain Pain Assessment: No/denies pain    Home Living Family/patient expects to be discharged to:: Private residence Living Arrangements: Spouse/significant other Available Help at Discharge: Family Type of Home: House Home Access: Level entry     Home Layout: Two level;Able to live on main level with bedroom/bathroom Home Equipment: Walker - 4 wheels      Prior Function Level of Independence: Independent with assistive device(s)         Comments:  was using rollator for mobility but had multiple falls     Hand Dominance   Dominant Hand: Right    Extremity/Trunk  Assessment   Upper Extremity Assessment Upper Extremity Assessment: Defer to OT evaluation    Lower Extremity Assessment Lower Extremity Assessment: Generalized weakness    Cervical / Trunk Assessment Cervical / Trunk Assessment: Kyphotic  Communication   Communication: No difficulties  Cognition Arousal/Alertness: Awake/alert Behavior During Therapy: WFL for tasks assessed/performed Overall Cognitive Status: Impaired/Different from baseline Area of Impairment: Problem solving                             Problem Solving: Slow processing        General Comments      Exercises     Assessment/Plan    PT Assessment Patient needs continued PT services  PT Problem List Decreased strength;Decreased balance;Decreased mobility;Decreased knowledge of use of DME;Decreased knowledge of precautions       PT Treatment Interventions DME instruction;Gait training;Functional mobility training;Therapeutic activities;Therapeutic exercise;Balance training;Patient/family education    PT Goals (Current goals can be found in the Care Plan section)  Acute Rehab PT Goals Patient Stated Goal: to get home PT Goal Formulation: With patient Time For Goal Achievement: 05/04/19 Potential to Achieve Goals: Good    Frequency Min 3X/week   Barriers to discharge        Co-evaluation               AM-PAC PT "6 Clicks" Mobility  Outcome Measure Help needed turning from your back to your side while in a flat bed without using bedrails?: None Help needed moving from lying on your back to sitting on the side of a flat bed without using bedrails?: A Little Help needed moving to and from a bed to a chair (including a wheelchair)?: A Little Help needed standing up from a chair using your arms (e.g., wheelchair or bedside chair)?: A Little Help needed to walk in hospital room?: A Little Help needed climbing 3-5 steps with a railing? : A Lot 6 Click Score: 18    End of Session  Equipment Utilized During Treatment: Gait belt Activity Tolerance: Patient tolerated treatment well Patient left: in chair;with call bell/phone within reach;with chair alarm set Nurse Communication: Mobility status PT Visit Diagnosis: Unsteadiness on feet (R26.81);Muscle weakness (generalized) (M62.81);History of falling (Z91.81);Repeated falls (R29.6)    Time: 9689-5702 PT Time Calculation (min) (ACUTE ONLY): 32 min   Charges:   PT Evaluation $PT Re-evaluation: 1 Re-eval PT Treatments $Gait Training: 8-22 mins        Ellamae Sia, PT, DPT Acute Rehabilitation Services Pager [phone] Office [phone]   Willy Eddy 04/20/2019, 10:26 AM

## 2019-04-20 NOTE — Progress Notes (Signed)
Inpatient Rehabilitation Admissions Coordinator  Inpatient rehab consult received. I met with patient and his wife at bedside for rehab assessment. W discussed goals and expectations of a possible inpt rehab admit. 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 I will follow up tomorrow to assess his progress and assist determining when pt would be ready for d/c per Dr. Rita Ohara.  Danne Baxter, RN, MSN Rehab Admissions Coordinator [phone] 04/20/2019 1:16 PM

## 2019-04-20 NOTE — Progress Notes (Signed)
Called to place Benjamin Hahn Coude catheter for urinary retention.  Placed without difficulty and pt tolerated the procedure well.  Bladder scan amount pre insertion was 682 cc.

## 2019-04-20 NOTE — Progress Notes (Signed)
Subjective: Patient resting comfortably in bed, without complaints.  He has had moderate drainage of bloody CSF into Jackson-Pratt drain.  Dressing clean and dry.  Objective: Vital signs in last 24 hours: Vitals:   04/20/19 0500 04/20/19 0600 04/20/19 0700 04/20/19 0800  BP: 119/62 137/74 (!) 148/63 (!) 141/57  Pulse: 62 (!) 58 60 (!) 58  Resp: 16 14 16 19   Temp:      TempSrc:      SpO2: 100% 100% 100% 100%  Weight:      Height:        Intake/Output from previous day: 08/26 0701 - 08/27 0700 In: 2391 [I.V.:2391] Out: 1580 [Urine:1350; Drains:130; Blood:100] Intake/Output this shift: Total I/O In: 95 [I.V.:95] Out: -   Physical Exam: Awake and alert, following commands.  Moving all 4 extremities.  CBC Recent Labs    04/17/19 1924  04/19/19 1747 04/19/19 1822  WBC 7.2  --   --   --   HGB 11.0*   < > 10.5* 10.5*  HCT 34.6*   < > 31.0* 31.0*  PLT 232  --   --   --    < > = values in this interval not displayed.   BMET Recent Labs    04/17/19 1924 04/19/19 0400  04/19/19 1747 04/19/19 1822  NA 136 139   < > 139 140  K 4.3 4.0   < > 3.8 3.8  CL 99 104  --   --   --   CO2 27 28  --   --   --   GLUCOSE 96 103*  --   --   --   BUN 26* 21  --   --   --   CREATININE 1.22 1.29*  --   --   --   CALCIUM 9.1 9.0  --   --   --    < > = values in this interval not displayed.    Assessment/Plan: Doing well following craniotomy for evacuation of subdural hematoma last evening.  Will begin regular diet, and begin to mobilize out of bed.  Patient is to progress to ambulation several times through the day and evening in the halls of the ICU.  PT and OT reconsulted.  Will assess whether patient will benefit from CIR.  Spoke with patient's wife by phone last evening after surgery, and again this morning, updating her on her husband's condition and our plans for treatment and care.  Hosie Spangle, MD 04/20/2019, 8:29 AM

## 2019-04-21 ENCOUNTER — Inpatient Hospital Stay (HOSPITAL_COMMUNITY): Payer: Medicare Other

## 2019-04-21 ENCOUNTER — Inpatient Hospital Stay (HOSPITAL_COMMUNITY)
Admission: RE | Admit: 2019-04-21 | Discharge: 2019-05-06 | DRG: 092 | Disposition: A | Discharge status: Home / Self Care | Payer: Medicare Other | Source: Intra-hospital | Attending: Physical Medicine & Rehabilitation | Admitting: Physical Medicine & Rehabilitation

## 2019-04-21 ENCOUNTER — Encounter (HOSPITAL_COMMUNITY): Payer: Self-pay | Admitting: Neurosurgery

## 2019-04-21 ENCOUNTER — Other Ambulatory Visit: Payer: Self-pay

## 2019-04-21 DIAGNOSIS — H53123 Transient visual loss, bilateral: Secondary | ICD-10-CM | POA: Diagnosis present

## 2019-04-21 DIAGNOSIS — I959 Hypotension, unspecified: Secondary | ICD-10-CM | POA: Diagnosis not present

## 2019-04-21 DIAGNOSIS — S065X2S Traumatic subdural hemorrhage with loss of consciousness of 31 minutes to 59 minutes, sequela: Secondary | ICD-10-CM

## 2019-04-21 DIAGNOSIS — Z833 Family history of diabetes mellitus: Secondary | ICD-10-CM | POA: Diagnosis not present

## 2019-04-21 DIAGNOSIS — Z8249 Family history of ischemic heart disease and other diseases of the circulatory system: Secondary | ICD-10-CM

## 2019-04-21 DIAGNOSIS — W228XXD Striking against or struck by other objects, subsequent encounter: Secondary | ICD-10-CM | POA: Diagnosis not present

## 2019-04-21 DIAGNOSIS — Z87891 Personal history of nicotine dependence: Secondary | ICD-10-CM | POA: Diagnosis not present

## 2019-04-21 DIAGNOSIS — K219 Gastro-esophageal reflux disease without esophagitis: Secondary | ICD-10-CM | POA: Diagnosis present

## 2019-04-21 DIAGNOSIS — Z79891 Long term (current) use of opiate analgesic: Secondary | ICD-10-CM

## 2019-04-21 DIAGNOSIS — E039 Hypothyroidism, unspecified: Secondary | ICD-10-CM | POA: Diagnosis present

## 2019-04-21 DIAGNOSIS — N401 Enlarged prostate with lower urinary tract symptoms: Secondary | ICD-10-CM | POA: Diagnosis present

## 2019-04-21 DIAGNOSIS — E7800 Pure hypercholesterolemia, unspecified: Secondary | ICD-10-CM | POA: Diagnosis present

## 2019-04-21 DIAGNOSIS — S065X0S Traumatic subdural hemorrhage without loss of consciousness, sequela: Secondary | ICD-10-CM | POA: Diagnosis not present

## 2019-04-21 DIAGNOSIS — F329 Major depressive disorder, single episode, unspecified: Secondary | ICD-10-CM | POA: Diagnosis present

## 2019-04-21 DIAGNOSIS — R338 Other retention of urine: Secondary | ICD-10-CM | POA: Diagnosis present

## 2019-04-21 DIAGNOSIS — S065X9S Traumatic subdural hemorrhage with loss of consciousness of unspecified duration, sequela: Secondary | ICD-10-CM | POA: Diagnosis not present

## 2019-04-21 DIAGNOSIS — F419 Anxiety disorder, unspecified: Secondary | ICD-10-CM | POA: Diagnosis present

## 2019-04-21 DIAGNOSIS — Z7982 Long term (current) use of aspirin: Secondary | ICD-10-CM

## 2019-04-21 DIAGNOSIS — Z79890 Hormone replacement therapy (postmenopausal): Secondary | ICD-10-CM

## 2019-04-21 DIAGNOSIS — E785 Hyperlipidemia, unspecified: Secondary | ICD-10-CM | POA: Diagnosis present

## 2019-04-21 DIAGNOSIS — K5900 Constipation, unspecified: Secondary | ICD-10-CM | POA: Diagnosis present

## 2019-04-21 DIAGNOSIS — Z23 Encounter for immunization: Secondary | ICD-10-CM

## 2019-04-21 DIAGNOSIS — I6200 Nontraumatic subdural hemorrhage, unspecified: Secondary | ICD-10-CM | POA: Diagnosis not present

## 2019-04-21 DIAGNOSIS — Z79899 Other long term (current) drug therapy: Secondary | ICD-10-CM | POA: Diagnosis not present

## 2019-04-21 DIAGNOSIS — Z9181 History of falling: Secondary | ICD-10-CM

## 2019-04-21 DIAGNOSIS — R296 Repeated falls: Secondary | ICD-10-CM | POA: Diagnosis present

## 2019-04-21 DIAGNOSIS — R339 Retention of urine, unspecified: Secondary | ICD-10-CM

## 2019-04-21 DIAGNOSIS — Z823 Family history of stroke: Secondary | ICD-10-CM

## 2019-04-21 DIAGNOSIS — I10 Essential (primary) hypertension: Secondary | ICD-10-CM | POA: Diagnosis present

## 2019-04-21 DIAGNOSIS — Z888 Allergy status to other drugs, medicaments and biological substances status: Secondary | ICD-10-CM

## 2019-04-21 DIAGNOSIS — S065XAA Traumatic subdural hemorrhage with loss of consciousness status unknown, initial encounter: Secondary | ICD-10-CM | POA: Diagnosis present

## 2019-04-21 DIAGNOSIS — S065X0D Traumatic subdural hemorrhage without loss of consciousness, subsequent encounter: Secondary | ICD-10-CM | POA: Diagnosis not present

## 2019-04-21 DIAGNOSIS — M7918 Myalgia, other site: Secondary | ICD-10-CM | POA: Diagnosis not present

## 2019-04-21 DIAGNOSIS — R079 Chest pain, unspecified: Secondary | ICD-10-CM | POA: Diagnosis not present

## 2019-04-21 DIAGNOSIS — S065X9A Traumatic subdural hemorrhage with loss of consciousness of unspecified duration, initial encounter: Secondary | ICD-10-CM | POA: Diagnosis present

## 2019-04-21 MED ORDER — ATORVASTATIN CALCIUM 40 MG PO TABS
40.0000 mg | ORAL_TABLET | Freq: Every day | ORAL | Status: DC
  Administered 2019-04-21 – 2019-05-05 (×15): 40 mg via ORAL
  Filled 2019-04-21 (×15): qty 1

## 2019-04-21 MED ORDER — METOPROLOL SUCCINATE ER 25 MG PO TB24
12.5000 mg | ORAL_TABLET | Freq: Every day | ORAL | Status: DC
  Administered 2019-04-22 – 2019-05-06 (×15): 12.5 mg via ORAL
  Filled 2019-04-21 (×15): qty 1

## 2019-04-21 MED ORDER — IMIPRAMINE HCL 10 MG PO TABS
10.0000 mg | ORAL_TABLET | Freq: Every day | ORAL | Status: DC
  Filled 2019-04-21 (×2): qty 1

## 2019-04-21 MED ORDER — VENLAFAXINE HCL ER 37.5 MG PO CP24
37.5000 mg | ORAL_CAPSULE | Freq: Every day | ORAL | Status: DC
  Administered 2019-04-22 – 2019-05-06 (×15): 37.5 mg via ORAL
  Filled 2019-04-21 (×15): qty 1

## 2019-04-21 MED ORDER — NITROGLYCERIN 0.4 MG SL SUBL: 0.4000 mg | SUBLINGUAL_TABLET | SUBLINGUAL | Status: DC | PRN

## 2019-04-21 MED ORDER — TAMSULOSIN HCL 0.4 MG PO CAPS
0.8000 mg | ORAL_CAPSULE | Freq: Every day | ORAL | Status: DC
  Administered 2019-04-21 – 2019-04-24 (×4): 0.8 mg via ORAL
  Filled 2019-04-21 (×4): qty 2

## 2019-04-21 MED ORDER — BUPROPION HCL ER (XL) 150 MG PO TB24
150.0000 mg | ORAL_TABLET | Freq: Every day | ORAL | Status: DC
  Administered 2019-04-22 – 2019-05-06 (×15): 150 mg via ORAL
  Filled 2019-04-21 (×16): qty 1

## 2019-04-21 MED ORDER — RAMIPRIL 1.25 MG PO CAPS
1.2500 mg | ORAL_CAPSULE | Freq: Every day | ORAL | Status: DC
  Administered 2019-04-22 – 2019-04-25 (×4): 1.25 mg via ORAL
  Filled 2019-04-21 (×4): qty 1

## 2019-04-21 MED ORDER — HYDROCODONE-ACETAMINOPHEN 5-325 MG PO TABS
1.0000 | ORAL_TABLET | ORAL | Status: DC | PRN
  Administered 2019-04-21 – 2019-04-22 (×2): 2 via ORAL
  Administered 2019-04-22: 1 via ORAL
  Administered 2019-04-23 (×2): 2 via ORAL
  Administered 2019-04-23: 1 via ORAL
  Administered 2019-04-24: 11:00:00 2 via ORAL
  Administered 2019-04-25 – 2019-04-26 (×4): 1 via ORAL
  Administered 2019-04-27: 21:00:00 2 via ORAL
  Administered 2019-04-28 – 2019-04-30 (×4): 1 via ORAL
  Administered 2019-05-01 – 2019-05-05 (×5): 2 via ORAL
  Administered 2019-05-05: 23:00:00 1 via ORAL
  Filled 2019-04-21: qty 1
  Filled 2019-04-21: qty 2
  Filled 2019-04-21: qty 1
  Filled 2019-04-21 (×2): qty 2
  Filled 2019-04-21 (×2): qty 1
  Filled 2019-04-21 (×6): qty 2
  Filled 2019-04-21 (×2): qty 1
  Filled 2019-04-21 (×2): qty 2
  Filled 2019-04-21: qty 1
  Filled 2019-04-21: qty 2
  Filled 2019-04-21 (×4): qty 1
  Filled 2019-04-21 (×2): qty 2

## 2019-04-21 MED ORDER — ALPRAZOLAM 0.5 MG PO TABS
0.5000 mg | ORAL_TABLET | Freq: Every evening | ORAL | Status: DC | PRN
  Administered 2019-04-21 – 2019-05-05 (×15): 0.5 mg via ORAL
  Filled 2019-04-21 (×15): qty 1

## 2019-04-21 MED ORDER — CLOBETASOL PROPIONATE 0.05 % EX CREA
TOPICAL_CREAM | Freq: Every day | CUTANEOUS | Status: DC | PRN
  Filled 2019-04-21: qty 15

## 2019-04-21 MED ORDER — LEVOTHYROXINE SODIUM 88 MCG PO TABS
88.0000 ug | ORAL_TABLET | Freq: Every day | ORAL | Status: DC
  Administered 2019-04-22 – 2019-05-06 (×15): 88 ug via ORAL
  Filled 2019-04-21 (×16): qty 1

## 2019-04-21 MED ORDER — ACETAMINOPHEN 325 MG PO TABS
325.0000 mg | ORAL_TABLET | ORAL | Status: DC | PRN
  Administered 2019-04-24 – 2019-05-04 (×11): 650 mg via ORAL
  Administered 2019-05-05: 325 mg via ORAL
  Filled 2019-04-21 (×15): qty 2

## 2019-04-21 MED ORDER — ONDANSETRON HCL 4 MG PO TABS
8.0000 mg | ORAL_TABLET | Freq: Three times a day (TID) | ORAL | Status: DC | PRN
  Administered 2019-04-22: 13:00:00 8 mg via ORAL
  Filled 2019-04-21: qty 2

## 2019-04-21 MED ORDER — SORBITOL 70 % SOLN
30.0000 mL | Freq: Every day | Status: DC | PRN
  Administered 2019-04-24: 30 mL via ORAL
  Filled 2019-04-21: qty 30

## 2019-04-21 MED ORDER — SODIUM CHLORIDE 0.9 % IV BOLUS
500.0000 mL | Freq: Once | INTRAVENOUS | Status: AC
  Administered 2019-04-21: 500 mL via INTRAVENOUS

## 2019-04-21 MED ORDER — CALCIUM CARBONATE ANTACID 500 MG PO CHEW: 1.0000 | CHEWABLE_TABLET | Freq: Every day | ORAL | Status: DC | PRN

## 2019-04-21 NOTE — Progress Notes (Signed)
Meredith Staggers, MD  Physician  Physical Medicine and Rehabilitation  PMR Pre-admission  Signed  Date of Service:  04/21/2019 10:37 AM      Related encounter: ED to Hosp-Admission (Discharged) from 04/17/2019 in [place] NEURO/TRAUMA/SURGICAL ICU      Signed         Show:Clear all _0 Manual_1 Template_2 Copied  Added by: _3 Cristina Gong, RN_4 Meredith Staggers, MD  _5 Hover for details PMR Admission Coordinator Pre-Admission Assessment  Patient: Benjamin Hahn is an 85 y.o., male MRN: [id number] DOB: 05/06/1933 Height: 5' 7.5" (171.5 cm) Weight: 87.1 kg  Insurance Information HMO:     PPO:      PCP:      IPA:      80/20:      OTHER:  PRIMARY: Medicare a and b      Policy#: [id number]      Subscriber: pt Benefits:  Phone #: passport one online     Name: 8/27 Eff. Date: 01/23/2000     Deduct: $1408      Out of Pocket Max: none      Life Max: none CIR: 100%      SNF: 20 full days Outpatient: 80%     Co-Pay: 20% Home Health: 100%      Co-Pay: none DME: 80%     Co-Pay: 20% Providers: in network  SECONDARY: BCBS supplement      Policy#: [id number]      Subscriber: pt  Medicaid Application Date:       Case Manager:  Disability Application Date:       Case Worker:   The "Data Collection Information Summary" for patients in Inpatient Rehabilitation Facilities with attached "Privacy Act Commerce Records" was provided and verbally reviewed with: Patient and Family  Emergency Contact Information         Contact Information    Name Relation Home Work Mobile   [place] Spouse [phone]  [phone]      Current Medical History  Patient Admitting Diagnosis: SDH  History of Present Illness:  85 year old right-handed male with history of BPH,hypertension,hyperlipidemia and occasional alcohol. Presented 04/17/2019 with reported multiple falls in the past 2 months no loss of consciousness as well as left hemiparesis.[name] An  MRI showed a large right cerebral convexity subdural hematoma with a 7 mm leftward midline shift identified. Underwent right frontoparietal temporal craniotomy and evacuation of subdural hematoma 04/19/2019 per Dr. [name]. Follow-up CT of the brain 04/21/2019 shows decrease in size of subdural improvement in midline shiftand plan to remove Jackson-Pratt drain. Hospital course urinary retention with history of BPH and bladder scan 682 cc with a 16 French coud catheter placed and plans to continue Foley tube until follow-up with urology services as patient has been followed by Dr. [name] in the past. Patient was placed on Flomax. Tolerating a regular diet. Upon ambulation with therapy today, BP 82/59 with 500 cc IVF bolus given with BP 109/57. Daily metoprolol and ramipril held.  Complete NIHSS TOTAL: 0  Patient's medical record from [facility] has been reviewed by the rehabilitation admission coordinator and physician.  Past Medical History      Past Medical History:  Diagnosis Date  . Anxiety   . Arthritis    might be in back, no problems  . BPH (benign prostatic hyperplasia)   . Depression 1990s  . GERD (gastroesophageal reflux disease)    occasional  . Hypercholesteremia    controled  . Hypothyroid   . Shingles May 2014   "  Right face, still has some"  . Temporal arteritis ([place])   . Transient blindness of both eyes     Family History   family history includes Diabetes in his maternal grandmother; Healthy in his daughter; Heart disease in his mother; Stroke in his father and sister.  Prior Rehab/Hospitalizations Has the patient had prior rehab or hospitalizations prior to admission? Yes  Has the patient had major surgery during 100 days prior to admission? Yes             Current Medications  Current Facility-Administered Medications:  .  0.9 %  sodium chloride infusion, 250 mL, Intravenous, PRN, Bergman, Meghan D, NP .  0.9 %  sodium  chloride infusion, , Intravenous, Continuous, Annye Asa, MD, Last Rate: 10 mL/hr at 04/19/19 1423 .  0.9 % NaCl with KCl 20 mEq/ L  infusion, , Intravenous, Continuous, Jovita Gamma, MD, Stopped at 04/20/19 1535 .  acetaminophen (TYLENOL) tablet 325-650 mg, 325-650 mg, Oral, Q4H PRN, Jovita Gamma, MD, 650 mg at 04/21/19 1119 .  ALPRAZolam Duanne Moron) tablet 0.5 mg, 0.5 mg, Oral, QHS PRN, Reinaldo Meeker, Meghan D, NP, 0.5 mg at 04/20/19 2144 .  atorvastatin (LIPITOR) tablet 40 mg, 40 mg, Oral, q1800, Bergman, Meghan D, NP, 40 mg at 04/20/19 1818 .  buPROPion (WELLBUTRIN XL) 24 hr tablet 150 mg, 150 mg, Oral, Daily, Bergman, Meghan D, NP, 150 mg at 04/21/19 1028 .  calcium carbonate (TUMS - dosed in mg elemental calcium) chewable tablet 200 mg of elemental calcium, 1 tablet, Oral, Daily PRN, Costella, Vincent J, PA-C, 200 mg of elemental calcium at 04/20/19 1959 .  Chlorhexidine Gluconate Cloth 2 % PADS 6 each, 6 each, Topical, Daily, Jovita Gamma, MD, 6 each at 04/20/19 1611 .  clobetasol cream (TEMOVATE) 0.05 %, , Topical, Daily PRN, Bergman, Meghan D, NP .  hydrALAZINE (APRESOLINE) injection 5-10 mg, 5-10 mg, Intravenous, Q1H PRN, Jovita Gamma, MD .  HYDROcodone-acetaminophen (NORCO/VICODIN) 5-325 MG per tablet 1-2 tablet, 1-2 tablet, Oral, Q4H PRN, Jovita Gamma, MD, 2 tablet at 04/20/19 2311 .  imipramine (TOFRANIL) tablet 10 mg, 10 mg, Oral, QHS, Bergman, Meghan D, NP, 10 mg at 04/20/19 2307 .  labetalol (NORMODYNE) injection 5-20 mg, 5-20 mg, Intravenous, Q1H PRN, Jovita Gamma, MD .  levothyroxine (SYNTHROID) tablet 88 mcg, 88 mcg, Oral, QAC breakfast, Jovita Gamma, MD, 88 mcg at 04/21/19 0547 .  metoprolol succinate (TOPROL-XL) 24 hr tablet 12.5 mg, 12.5 mg, Oral, Daily, Jovita Gamma, MD, 12.5 mg at 04/20/19 1031 .  morphine 4 MG/ML injection 4-8 mg, 4-8 mg, Intramuscular, Q3H PRN, Jovita Gamma, MD .  nitroGLYCERIN (NITROSTAT) SL tablet 0.4 mg, 0.4 mg,  Sublingual, Q5 min PRN, Bergman, Meghan D, NP .  ondansetron (ZOFRAN) tablet 8 mg, 8 mg, Oral, Q8H PRN, Costella, Vincent J, PA-C, 8 mg at 04/20/19 1959 .  ramipril (ALTACE) capsule 1.25 mg, 1.25 mg, Oral, Daily, Bergman, Meghan D, NP, 1.25 mg at 04/20/19 1034 .  sodium chloride flush (NS) 0.9 % injection 3 mL, 3 mL, Intravenous, Q12H, Bergman, Meghan D, NP, 3 mL at 04/20/19 2322 .  sodium chloride flush (NS) 0.9 % injection 3 mL, 3 mL, Intravenous, PRN, Bergman, Meghan D, NP .  tamsulosin (FLOMAX) capsule 0.8 mg, 0.8 mg, Oral, QPC supper, Bergman, Meghan D, NP, 0.8 mg at 04/20/19 1818 .  venlafaxine XR (EFFEXOR-XR) 24 hr capsule 37.5 mg, 37.5 mg, Oral, Daily, Bergman, Meghan D, NP, 37.5 mg at 04/21/19 0930  Patients Current Diet:     Diet  Order                  Diet regular Room service appropriate? Yes; Fluid consistency: Thin  Diet effective now               Precautions / Restrictions Precautions Precautions: Fall, Other (comment) Precaution Comments: drain Restrictions Weight Bearing Restrictions: No   Has the patient had 2 or more falls or a fall with injury in the past year? 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 Independent  Indoor Mobility: Did the patient need assistance with walking from room to room (with or without device)? Independent  Stairs: Did the patient need assistance with internal or external stairs (with or without device)? Independent  Functional Cognition: Did the patient need help planning regular tasks such as shopping or remembering to take medications? [place] Devices/Equipment: Gilford Rile (specify type) Home Equipment: Walker - 4 wheels  Prior Device Use: Indicate devices/aids used by the patient prior to  current illness, exacerbation or injury? Walker just since recent falls; rollator  Current Functional Level Cognition  Overall Cognitive Status: Impaired/Different from baseline Orientation Level: Oriented X4 General Comments: Pt not able to fully move around obstacles when asked to maneuver through hallway. Pt bumping into items on L side.     Extremity Assessment (includes Sensation/Coordination)  Upper Extremity Assessment: Defer to OT evaluation  Lower Extremity Assessment: Generalized weakness    ADLs  Overall ADL's : Needs assistance/impaired Eating/Feeding: NPO Grooming: Min guard, Wash/dry hands, Wash/dry face, Oral care, Brushing hair, Standing Upper Body Bathing: Set up, Sitting Lower Body Bathing: Min guard, Sitting/lateral leans, Sit to/from stand Upper Body Dressing : Set up, Sitting Lower Body Dressing: Min guard, Sitting/lateral leans, Sit to/from stand, Cueing for safety Toilet Transfer: Min guard, RW, Regular Toilet Toileting- Clothing Manipulation and Hygiene: Minimal assistance, Sitting/lateral lean, Sit to/from stand, Cueing for safety Functional mobility during ADLs: Min guard, Minimal assistance, Rolling walker, Cueing for safety, Cueing for sequencing General ADL Comments: Pt minA overall for ADL. Pt donning socks at EOB and requiring assist for proper toilet hygiene. Pt stood at sink for ADL with minguardA.    Mobility  Overal bed mobility: Needs Assistance Bed Mobility: Supine to Sit Supine to sit: Min assist Sit to supine: Min guard General bed mobility comments: minA to scoot hips forward using bed pad, pt with initial posterior lean without foot support    Transfers  Overall transfer level: Needs assistance Equipment used: Rolling walker (2 wheeled) Transfers: Sit to/from Stand Sit to Stand: Min assist Stand pivot transfers: Mod assist General transfer comment: Pt requiring min assist to boost up from bed, cues for hand placement     Ambulation / Gait / Stairs / Wheelchair Mobility  Ambulation/Gait Ambulation/Gait assistance: Min assist Gait Distance (Feet): 90 Feet Assistive device: Rolling walker (2 wheeled) Gait Pattern/deviations: Step-through pattern, Decreased stride length, Trunk flexed General Gait Details: Slow, speed, decreased bilateral foot clearance. Cues for walker proximity, pt unable to significantly correct Gait velocity: decreased    Posture / Balance Dynamic Sitting Balance Sitting balance - Comments: Supervision for safety, initial posterior lean with feet unsupported Balance Overall balance assessment: Needs assistance Sitting-balance support: No upper extremity supported, Feet supported Sitting balance-Leahy Scale: Fair Sitting balance - Comments: Supervision for safety, initial posterior  lean with feet unsupported Standing balance support: Bilateral upper extremity supported, During functional activity Standing balance-Leahy Scale: Poor Standing balance comment: Reliant on BUE and external support    Special needs/care consideration BiPAP/CPAP  N/a CPM  N/a Continuous Drip IV  N/a Dialysis n/a Life Vest  N/a Oxygen  N/a Special Bed  N/a Trach Size  N/a Wound Vac n/a Skin  Left medical head surgical incision with dressing; JP drain to be removed 8/28 Bowel mgmt: 8/28 continent Bladder mgmt: Coude catheter placed 8/27 at 1900 Diabetic mgmt: n/a Behavioral consideration  N/a Chemo/radiation  N/a Fall risk Visitor designee is wife, Facilities manager   Previous Home Environment  Living Arrangements: Spouse/significant other  Lives With: Spouse Available Help at Discharge: Family, Available 24 hours/day(wife) Type of Home: House Home Layout: Two level, Able to live on main level with bedroom/bathroom Home Access: Level entry Bathroom Shower/Tub: Multimedia programmer: Handicapped height Bathroom Accessibility: Yes How Accessible: Accessible via walker [place]: No   Discharge Living Setting Plans for Discharge Living Setting: Patient's home, Lives with (comment)(wife) Type of Home at Discharge: House Discharge Home Layout: Two level, Able to live on main level with bedroom/bathroom Discharge Home Access: Level entry Discharge Bathroom Shower/Tub: Walk-in shower Discharge Bathroom Toilet: Handicapped height Discharge Bathroom Accessibility: Yes How Accessible: Accessible via walker Does the patient have any problems obtaining your medications?: No  Social/Family/Support Systems Patient Roles: Spouse, Parent(lawyer) Contact Information: wife. [place] Anticipated Caregiver: wife Anticipated Caregiver's Contact Information: see above Ability/Limitations of Caregiver: no limitations Caregiver Availability: 24/7 Discharge Plan Discussed with Primary Caregiver: Yes Is Caregiver In Agreement with Plan?: Yes Does Caregiver/Family have Issues with Lodging/Transportation while Pt is in Rehab?: No  Goals/Additional Needs Patient/Family Goal for Rehab: supervision PT, OT, and SLP Expected length of stay: ELOS 7 days Pt/Family Agrees to Admission and willing to participate: Yes Program Orientation Provided & Reviewed with Pt/Caregiver Including Roles  & Responsibilities: Yes  Decrease burden of Care through IP rehab admission: n/a  Possible need for SNF placement upon discharge: not anticipated  Patient Condition: I have reviewed medical records from [facility]  , spoken with CM, and patient and spouse. I met with patient at the bedside for inpatient rehabilitation assessment.  Patient will benefit from ongoing PT, OT and SLP, can actively participate in 3 hours of therapy a day 5 days of the week, and can make measurable gains during the admission.  Patient will also benefit from the coordinated team approach during an Inpatient Acute Rehabilitation admission.  The patient will receive intensive therapy as well as Rehabilitation physician,  nursing, social worker, and care management interventions.  Due to bladder management, bowel management, safety, skin/wound care, disease management, medication administration, pain management and patient education the patient requires 24 hour a day rehabilitation nursing.  The patient is currently min assist with mobility and basic ADLs.  Discharge setting and therapy post discharge at home with outpatient is anticipated.  Patient has agreed to participate in the Acute Inpatient Rehabilitation Program and will admit today.  Preadmission Screen Completed By:  Cleatrice Burke, 04/21/2019 11:27 AM ______________________________________________________________________   Discussed status with Dr. Naaman Plummer  on  04/21/2019 at 1127 and received approval for admission today.  Admission Coordinator:  Cleatrice Burke, RN MSN time [id number] Date  04/21/2019   Assessment/Plan: Diagnosis: right FPT SDH 1. Does the need for close, 24 hr/day Medical supervision in concert with the patient's rehab needs make it unreasonable for this patient to  be served in a less intensive setting? Yes 2. Co-Morbidities requiring supervision/potential complications: HTN 3. Due to bladder management, bowel management, safety, skin/wound care, disease management, medication administration, pain management and patient education, does the patient require 24 hr/day rehab nursing? Yes 4. Does the patient require coordinated care of a physician, rehab nurse, PT (1-2 hrs/day, 5 days/week), OT (1-2 hrs/day, 5 days/week) and SLP (1-2 hrs/day, 5 days/week) to address physical and functional deficits in the context of the above medical diagnosis(es)? Yes Addressing deficits in the following areas: balance, endurance, locomotion, strength, transferring, bowel/bladder control, bathing, dressing, feeding, grooming, toileting, cognition, speech and psychosocial support 5. Can the patient actively participate in an intensive therapy program  of at least 3 hrs of therapy 5 days a week? Yes 6. The potential for patient to make measurable gains while on inpatient rehab is excellent 7. Anticipated functional outcomes upon discharge from inpatients are: supervision PT, supervision OT, supervision SLP 8. Estimated rehab length of stay to reach the above functional goals is: 7 days 9. Anticipated D/C setting: Home 10. Anticipated post D/C treatments: [place] therapy 11. Overall Rehab/Functional Prognosis: excellent  MD Signature: Meredith Staggers, MD, [place] [facility] 04/21/2019         Revision History

## 2019-04-21 NOTE — Plan of Care (Signed)

## 2019-04-21 NOTE — PMR Pre-admission (Signed)
PMR Admission Coordinator Pre-Admission Assessment  Patient: Benjamin Hahn is an 85 y.o., male MRN: [id number] DOB: 1933-01-30 Height: 5' 7.5" (171.5 cm) Weight: 87.1 kg  Insurance Information HMO:     PPO:      PCP:      IPA:      80/20:      OTHER:  PRIMARY: Medicare a and b      Policy#: [id number]      Subscriber: pt Benefits:  Phone #: passport one online     Name: 8/27 Eff. Date: 01/23/2000     Deduct: $1408      Out of Pocket Max: none      Life Max: none CIR: 100%      SNF: 20 full days Outpatient: 80%     Co-Pay: 20% Home Health: 100%      Co-Pay: none DME: 80%     Co-Pay: 20% Providers: in network  SECONDARY: BCBS supplement      Policy#: [id number]      Subscriber: pt  Medicaid Application Date:       Case Manager:  Disability Application Date:       Case Worker:   The "Data Collection Information Summary" for patients in Inpatient Rehabilitation Facilities with attached "Privacy Act [place] Records" was provided and verbally reviewed with: Patient and Family  Emergency Contact Information Contact Information    Name Relation Home Work Mobile   Benjamin Hahn Spouse [phone]  [phone]      Current Medical History  Patient Admitting Diagnosis: SDH  History of Present Illness:  85 year old right-handed male with history of BPH,hypertension,hyperlipidemia and occasional alcohol. Presented 04/17/2019 with reported multiple falls in the past 2 months no loss of consciousness as well as left hemiparesis.[name] An MRI showed a large right cerebral convexity subdural hematoma with a 7 mm leftward midline shift identified. Underwent right frontoparietal temporal craniotomy and evacuation of subdural hematoma 04/19/2019 per Dr. [name]. Follow-up CT of the brain 04/21/2019 shows decrease in size of subdural improvement in midline shiftand plan to remove Jackson-Pratt drain. Hospital course urinary retention with history of BPH and bladder scan 682  cc with a 16 French coud catheter placed and plans to continue Foley tube until follow-up with urology services as patient has been followed by Dr. [name] in the past. Patient was placed on Flomax. Tolerating a regular diet. Upon ambulation with therapy today, BP 82/59 with 500 cc IVF bolus given with BP 109/57. Daily metoprolol and ramipril held.  Complete NIHSS TOTAL: 0  Patient's medical record from [facility] has been reviewed by the rehabilitation admission coordinator and physician.  Past Medical History  Past Medical History:  Diagnosis Date  . Anxiety   . Arthritis    might be in back, no problems  . BPH (benign prostatic hyperplasia)   . Depression 1990s  . GERD (gastroesophageal reflux disease)    occasional  . Hypercholesteremia    controled  . Hypothyroid   . Shingles May 2014   "Right face, still has some"  . Temporal arteritis ([place])   . Transient blindness of both eyes     Family History   family history includes Diabetes in his maternal grandmother; Healthy in his daughter; Heart disease in his mother; Stroke in his father and sister.  Prior Rehab/Hospitalizations Has the patient had prior rehab or hospitalizations prior to admission? Yes  Has the patient had major surgery during 100 days prior to admission? Yes   Current Medications  Current  Facility-Administered Medications:  .  0.9 %  sodium chloride infusion, 250 mL, Intravenous, PRN, Bergman, Meghan D, NP .  0.9 %  sodium chloride infusion, , Intravenous, Continuous, Annye Asa, MD, Last Rate: 10 mL/hr at 04/19/19 1423 .  0.9 % NaCl with KCl 20 mEq/ L  infusion, , Intravenous, Continuous, Jovita Gamma, MD, Stopped at 04/20/19 1535 .  acetaminophen (TYLENOL) tablet 325-650 mg, 325-650 mg, Oral, Q4H PRN, Jovita Gamma, MD, 650 mg at 04/21/19 1119 .  ALPRAZolam Duanne Moron) tablet 0.5 mg, 0.5 mg, Oral, QHS PRN, Reinaldo Meeker, Meghan D, NP, 0.5 mg at 04/20/19 2144 .  atorvastatin (LIPITOR)  tablet 40 mg, 40 mg, Oral, q1800, Bergman, Meghan D, NP, 40 mg at 04/20/19 1818 .  buPROPion (WELLBUTRIN XL) 24 hr tablet 150 mg, 150 mg, Oral, Daily, Bergman, Meghan D, NP, 150 mg at 04/21/19 1028 .  calcium carbonate (TUMS - dosed in mg elemental calcium) chewable tablet 200 mg of elemental calcium, 1 tablet, Oral, Daily PRN, Costella, Vincent J, PA-C, 200 mg of elemental calcium at 04/20/19 1959 .  Chlorhexidine Gluconate Cloth 2 % PADS 6 each, 6 each, Topical, Daily, Jovita Gamma, MD, 6 each at 04/20/19 1611 .  clobetasol cream (TEMOVATE) 0.05 %, , Topical, Daily PRN, Bergman, Meghan D, NP .  hydrALAZINE (APRESOLINE) injection 5-10 mg, 5-10 mg, Intravenous, Q1H PRN, Jovita Gamma, MD .  HYDROcodone-acetaminophen (NORCO/VICODIN) 5-325 MG per tablet 1-2 tablet, 1-2 tablet, Oral, Q4H PRN, Jovita Gamma, MD, 2 tablet at 04/20/19 2311 .  imipramine (TOFRANIL) tablet 10 mg, 10 mg, Oral, QHS, Bergman, Meghan D, NP, 10 mg at 04/20/19 2307 .  labetalol (NORMODYNE) injection 5-20 mg, 5-20 mg, Intravenous, Q1H PRN, Jovita Gamma, MD .  levothyroxine (SYNTHROID) tablet 88 mcg, 88 mcg, Oral, QAC breakfast, Jovita Gamma, MD, 88 mcg at 04/21/19 0547 .  metoprolol succinate (TOPROL-XL) 24 hr tablet 12.5 mg, 12.5 mg, Oral, Daily, Jovita Gamma, MD, 12.5 mg at 04/20/19 1031 .  morphine 4 MG/ML injection 4-8 mg, 4-8 mg, Intramuscular, Q3H PRN, Jovita Gamma, MD .  nitroGLYCERIN (NITROSTAT) SL tablet 0.4 mg, 0.4 mg, Sublingual, Q5 min PRN, Bergman, Meghan D, NP .  ondansetron (ZOFRAN) tablet 8 mg, 8 mg, Oral, Q8H PRN, Costella, Vincent J, PA-C, 8 mg at 04/20/19 1959 .  ramipril (ALTACE) capsule 1.25 mg, 1.25 mg, Oral, Daily, Bergman, Meghan D, NP, 1.25 mg at 04/20/19 1034 .  sodium chloride flush (NS) 0.9 % injection 3 mL, 3 mL, Intravenous, Q12H, Bergman, Meghan D, NP, 3 mL at 04/20/19 2322 .  sodium chloride flush (NS) 0.9 % injection 3 mL, 3 mL, Intravenous, PRN, Bergman, Meghan D, NP .   tamsulosin (FLOMAX) capsule 0.8 mg, 0.8 mg, Oral, QPC supper, Bergman, Meghan D, NP, 0.8 mg at 04/20/19 1818 .  venlafaxine XR (EFFEXOR-XR) 24 hr capsule 37.5 mg, 37.5 mg, Oral, Daily, Bergman, Meghan D, NP, 37.5 mg at 04/21/19 0930  Patients Current Diet:  Diet Order            Diet regular Room service appropriate? Yes; Fluid consistency: Thin  Diet effective now              Precautions / Restrictions Precautions Precautions: Fall, Other (comment) Precaution Comments: drain Restrictions Weight Bearing Restrictions: No   Has the patient had 2 or more falls or a fall with injury in the past year? 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 Independent  Indoor Mobility: Did the patient need assistance with walking from room to room (with or without device)? Independent  Stairs: Did the patient need assistance with internal or external stairs (with or without device)? Independent  Functional Cognition: Did the patient need help planning regular tasks such as shopping or remembering to take medications? [place] Devices/Equipment: Gilford Rile (specify type) Home Equipment: Walker - 4 wheels  Prior Device Use: Indicate devices/aids used by the patient prior to current illness, exacerbation or injury? Walker just since recent falls; rollator  Current Functional Level Cognition  Overall Cognitive Status: Impaired/Different from baseline Orientation Level: Oriented X4 General Comments: Pt not able to fully move around obstacles when asked to maneuver through hallway. Pt bumping into items on L side.     Extremity Assessment (includes Sensation/Coordination)  Upper Extremity Assessment: Defer to OT evaluation  Lower Extremity Assessment: Generalized weakness     ADLs  Overall ADL's : Needs assistance/impaired Eating/Feeding: NPO Grooming: Min guard, Wash/dry hands, Wash/dry face, Oral care, Brushing hair, Standing Upper Body Bathing: Set up, Sitting Lower Body Bathing: Min guard, Sitting/lateral leans, Sit to/from stand Upper Body Dressing : Set up, Sitting Lower Body Dressing: Min guard, Sitting/lateral leans, Sit to/from stand, Cueing for safety Toilet Transfer: Min guard, RW, Regular Toilet Toileting- Clothing Manipulation and Hygiene: Minimal assistance, Sitting/lateral lean, Sit to/from stand, Cueing for safety Functional mobility during ADLs: Min guard, Minimal assistance, Rolling walker, Cueing for safety, Cueing for sequencing General ADL Comments: Pt minA overall for ADL. Pt donning socks at EOB and requiring assist for proper toilet hygiene. Pt stood at sink for ADL with minguardA.    Mobility  Overal bed mobility: Needs Assistance Bed Mobility: Supine to Sit Supine to sit: Min assist Sit to supine: Min guard General bed mobility comments: minA to scoot hips forward using bed pad, pt with initial posterior lean without foot support    Transfers  Overall transfer level: Needs assistance Equipment used: Rolling walker (2 wheeled) Transfers: Sit to/from Stand Sit to Stand: Min assist Stand pivot transfers: Mod assist General transfer comment: Pt requiring min assist to boost up from bed, cues for hand placement    Ambulation / Gait / Stairs / Wheelchair Mobility  Ambulation/Gait Ambulation/Gait assistance: Min assist Gait Distance (Feet): 90 Feet Assistive device: Rolling walker (2 wheeled) Gait Pattern/deviations: Step-through pattern, Decreased stride length, Trunk flexed General Gait Details: Slow, speed, decreased bilateral foot clearance. Cues for walker proximity, pt unable to significantly correct Gait velocity: decreased    Posture / Balance Dynamic Sitting Balance Sitting balance - Comments: Supervision for safety,  initial posterior lean with feet unsupported Balance Overall balance assessment: Needs assistance Sitting-balance support: No upper extremity supported, Feet supported Sitting balance-Leahy Scale: Fair Sitting balance - Comments: Supervision for safety, initial posterior lean with feet unsupported Standing balance support: Bilateral upper extremity supported, During functional activity Standing balance-Leahy Scale: Poor Standing balance comment: Reliant on BUE and external support    Special needs/care consideration BiPAP/CPAP  N/a CPM  N/a Continuous Drip IV  N/a Dialysis n/a Life Vest  N/a Oxygen  N/a Special Bed  N/a Trach Size  N/a Wound Vac n/a Skin  Left medical head surgical incision with dressing; JP drain to be removed 8/28 Bowel mgmt: 8/28 continent Bladder mgmt: Coude catheter placed 8/27 at 1900 Diabetic mgmt: n/a Behavioral consideration  N/a Chemo/radiation  N/a Fall risk Visitor  designee is wife, Lou Miner   Previous Home Environment  Living Arrangements: Spouse/significant other  Lives With: Spouse Available Help at Discharge: Family, Available 24 hours/day(wife) Type of Home: House Home Layout: Two level, Able to live on main level with bedroom/bathroom Home Access: Level entry Bathroom Shower/Tub: Multimedia programmer: Handicapped height Bathroom Accessibility: Yes How Accessible: Accessible via [place]: No  Discharge Living Setting Plans for Discharge Living Setting: Patient's home, Lives with (comment)(wife) Type of Home at Discharge: House Discharge [place]: Two level, Able to live on main level with bedroom/bathroom Discharge Home Access: Level entry Discharge Bathroom Shower/Tub: Walk-in shower Discharge Bathroom Toilet: Handicapped height Discharge Bathroom Accessibility: Yes How Accessible: Accessible via walker Does the patient have any problems obtaining your medications?: No  Social/Family/Support  Systems Patient Roles: Spouse, Parent(lawyer) Contact Information: wife. Adele Anticipated Caregiver: wife Anticipated Caregiver's Contact Information: see above Ability/Limitations of Caregiver: no limitations Caregiver Availability: 24/7 Discharge Plan Discussed with Primary Caregiver: Yes Is Caregiver In Agreement with Plan?: Yes Does Caregiver/Family have Issues with Lodging/Transportation while Pt is in Rehab?: No  Goals/Additional Needs Patient/Family Goal for Rehab: supervision PT, OT, and SLP Expected length of stay: ELOS 7 days Pt/Family Agrees to Admission and willing to participate: Yes Program Orientation Provided & Reviewed with Pt/Caregiver Including Roles  & Responsibilities: Yes  Decrease burden of Care through IP rehab admission: n/a  Possible need for SNF placement upon discharge: not anticipated  Patient Condition: I have reviewed medical records from [facility]  , spoken with CM, and patient and spouse. I met with patient at the bedside for inpatient rehabilitation assessment.  Patient will benefit from ongoing PT, OT and SLP, can actively participate in 3 hours of therapy a day 5 days of the week, and can make measurable gains during the admission.  Patient will also benefit from the coordinated team approach during an Inpatient Acute Rehabilitation admission.  The patient will receive intensive therapy as well as Rehabilitation physician, nursing, social worker, and care management interventions.  Due to bladder management, bowel management, safety, skin/wound care, disease management, medication administration, pain management and patient education the patient requires 24 hour a day rehabilitation nursing.  The patient is currently min assist with mobility and basic ADLs.  Discharge setting and therapy post discharge at home with outpatient is anticipated.  Patient has agreed to participate in the Acute Inpatient Rehabilitation Program and will admit  today.  Preadmission Screen Completed By:  Cleatrice Burke, 04/21/2019 11:27 AM ______________________________________________________________________   Discussed status with Dr. Naaman Plummer  on  04/21/2019 at 1127 and received approval for admission today.  Admission Coordinator:  Cleatrice Burke, RN MSN time [id number] Date  04/21/2019   Assessment/Plan: Diagnosis: right FPT SDH 1. Does the need for close, 24 hr/day Medical supervision in concert with the patient's rehab needs make it unreasonable for this patient to be served in a less intensive setting? Yes 2. Co-Morbidities requiring supervision/potential complications: HTN 3. Due to bladder management, bowel management, safety, skin/wound care, disease management, medication administration, pain management and patient education, does the patient require 24 hr/day rehab nursing? Yes 4. Does the patient require coordinated care of a physician, rehab nurse, PT (1-2 hrs/day, 5 days/week), OT (1-2 hrs/day, 5 days/week) and SLP (1-2 hrs/day, 5 days/week) to address physical and functional deficits in the context of the above medical diagnosis(es)? Yes Addressing deficits in the following areas: balance, endurance, locomotion, strength, transferring, bowel/bladder control, bathing, dressing, feeding, grooming, toileting, cognition,  speech and psychosocial support 5. Can the patient actively participate in an intensive therapy program of at least 3 hrs of therapy 5 days a week? Yes 6. The potential for patient to make measurable gains while on inpatient rehab is excellent 7. Anticipated functional outcomes upon discharge from inpatients are: supervision PT, supervision OT, supervision SLP 8. Estimated rehab length of stay to reach the above functional goals is: 7 days 9. Anticipated D/C setting: Home 10. Anticipated post D/C treatments: [place] therapy 11. Overall Rehab/Functional Prognosis: excellent  MD Signature: Meredith Staggers, MD,  [place] [facility] 04/21/2019

## 2019-04-21 NOTE — Discharge Summary (Signed)
Physician Discharge Summary  Patient ID: Benjamin Hahn MRN: [id number] DOB/AGE: 1933/01/24 85 y.o.  Admit date: 04/17/2019 Discharge date: 04/21/2019  Admission Diagnoses:  Right hemispheric subacute and chronic subdural hematoma with left hemiparesis  Discharge Diagnoses:  Right hemispheric subacute and chronic subdural hematoma with left hemiparesis Active Problems:   Subdural hematoma [facility])   Discharged Condition: good  Hospital Course:  Patient was admitted by Dr. Newman Pies after having a been having gradual so, subtle neurologic difficulty, and having undergone an outpatient MRI that revealed a right hemispheric subdural hematoma.  Patient's family asked that I take over care, and patient was taken to surgery for a right frontoparietal temporal craniotomy and evacuation of the subdural hematoma.  A Jackson-Pratt drain was placed in the subdural space.  CT of the brain was obtained today, and showed good evacuation of the subdural hematoma, and the Jackson-Pratt drainage had steadily become clearer. The drain was then removed after today's CT and the drain exit site stapled closed.  Patient was seen in consultation by physical therapy and occupational therapy and was also seen by Physical Medicine and Rehabilitation consultation, regarding comprehensive inpatient rehabilitation.  Patient was felt to be a good candidate for inpatient rehab and arrangements have been made for transfer today.  On the 1st postoperative day, the ICU nursing staff DC the patient's Foley catheter per protocol.  They monitored patient is voiding function.  He did not void, and serial bladder scan showed increasing bladder volume.  Patient declined it out catheterizations and requested replacement of the Foley catheter.  Estimated Foley catheter could not be placed, and therefore a Coude catheter was placed. He has been followed at [facility] (previously by Dr. [name]). We called and consulted with  Dr. Link Snuffer who recommended maintaining the catheter for 5-7 days, and then having him follow up at [facility] for a voiding trial.  I have discussed this with the inpatient rehab staff.  Discharge Exam: Blood pressure (!) 115/58, pulse 66, temperature 98.5 F (36.9 C), temperature source Oral, resp. rate 19, height 5' 7.5" (1.715 m), weight 87.1 kg, SpO2 99 %.  Disposition:   [facility] inpatient rehabilitation unit for comprehensive inpatient rehabilitation   Allergies as of 04/21/2019      Reactions   Lorazepam Anxiety      Medication List    STOP taking these medications   aspirin 81 MG chewable tablet     TAKE these medications   acetaminophen 500 MG tablet Commonly known as: TYLENOL Take 500 mg by mouth daily as needed (pain).   ALPRAZolam 0.25 MG tablet Commonly known as: XANAX 2  qhs What changed:   how much to take  how to take this  when to take this  additional instructions   atorvastatin 40 MG tablet Commonly known as: LIPITOR Take 1 tablet (40 mg total) by mouth daily at 6 PM.   buPROPion 150 MG 24 hr tablet Commonly known as: WELLBUTRIN XL Take 1 tablet (150 mg total) by mouth daily. 2  qam What changed: additional instructions   clobetasol cream 0.05 % Commonly known as: TEMOVATE APPLY AS DIRECTED. What changed:   how much to take  when to take this  reasons to take this   imipramine 10 MG tablet Commonly known as: TOFRANIL 1 q hs What changed:   how much to take  how to take this  when to take this  additional instructions   levothyroxine 88 MCG tablet Commonly known  as: SYNTHROID TAKE 1 TABLET BY MOUTH DAILY. What changed: when to take this   metoprolol succinate 25 MG 24 hr tablet Commonly known as: TOPROL-XL TAKE (1/2) TABLET DAILY. What changed: See the new instructions.   nitroGLYCERIN 0.4 MG SL tablet Commonly known as: NITROSTAT Place 1 tablet (0.4 mg total) under the tongue every 5 (five)  minutes as needed for chest pain (CP or SOB).   ramipril 1.25 MG capsule Commonly known as: Altace Take 1 capsule (1.25 mg total) by mouth daily.   tamsulosin 0.4 MG Caps capsule Commonly known as: FLOMAX TAKE (1) CAPSULE DAILY. What changed: See the new instructions.   venlafaxine XR 37.5 MG 24 hr capsule Commonly known as: EFFEXOR-XR Take 1 capsule (37.5 mg total) by mouth daily.        SignedHosie Spangle, MD 04/21/2019, 12:44 PM

## 2019-04-21 NOTE — H&P (Signed)
Physical Medicine and Rehabilitation Admission H&P    CC-Headache     HPI: Benjamin Hahn is a 85 year old right-handed male with history of BPH, hypertension, hyperlipidemia and occasional alcohol.  Per chart review patient lives with spouse.  Two-level home with bedroom on Main level with level entry to the home.  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  Presented 04/17/2019 with reported multiple falls in the past 2 months no loss of consciousness as well as left hemiparesis.[name]  An MRI showed a large right cerebral convexity subdural hematoma with a 7 mm leftward midline shift identified.  Underwent right frontoparietal temporal craniotomy and evacuation of subdural hematoma 04/19/2019 per Dr. [name].  Follow-up CT of the brain 04/21/2019 shows decrease in size of subdural improvement in midline shift and plan to remove Jackson-Pratt drain.  Hospital course urinary retention with history of BPH and bladder scan 682 cc with a 16 French coud catheter placed and plans to continue Foley tube until follow-up with urology services as patient has been followed by Dr. [name] in the past.  Patient was placed on Flomax.  Tolerating a regular diet.  Therapy evaluations completed and patient was admitted for a comprehensive rehab program    Review of Systems  Constitutional: Negative for chills and fever.  HENT: Negative for hearing loss.   Eyes: Negative for blurred vision and double vision.  Respiratory: Negative for cough and shortness of breath.   Cardiovascular: Negative for chest pain, palpitations and leg swelling.  Gastrointestinal: Positive for constipation. Negative for heartburn, nausea and vomiting.       GERD  Genitourinary: Positive for frequency and urgency. Negative for dysuria, flank pain and hematuria.  Musculoskeletal: Positive for falls and myalgias.  Skin: Negative for rash.  Psychiatric/Behavioral:       Anxiety  All other systems reviewed and are negative          Past Medical History:  Diagnosis Date  . Anxiety    . Arthritis      might be in back, no problems  . BPH (benign prostatic hyperplasia)    . Depression 1990s  . GERD (gastroesophageal reflux disease)      occasional  . Hypercholesteremia      controled  . Hypothyroid    . Shingles May 2014    "Right face, still has some"  . Temporal arteritis ([place])    . Transient blindness of both eyes          Past Surgical History:  Procedure Laterality Date  . ARTERY BIOPSY Right 06/20/2013    Procedure: BIOPSY TEMPORAL ARTERY RIGHT;  Surgeon: Earnstine Regal, MD;  Location: [facility];  Service: General;  Laterality: Right;  . CARDIAC CATHETERIZATION N/A 09/02/2015    Procedure: Left Heart Cath and Coronary Angiography;  Surgeon: Charolette Forward, MD;  Location: [facility];  Service: Cardiovascular;  Laterality: N/A;  . CARDIAC CATHETERIZATION N/A 09/02/2015    Procedure: Coronary Stent Intervention;  Surgeon: Charolette Forward, MD;  Location: [facility];  Service: Cardiovascular;  Laterality: N/A;  1.  mid RCA      (3.0/28mm Xience) 2.  Mid LAD      (3.0/23mm Xience)  . CRANIOTOMY N/A 04/19/2019    Procedure: CRANIOTOMY HEMATOMA EVACUATION SUBDURAL;  Surgeon: Jovita Gamma, MD;  Location: [facility];  Service: Neurosurgery;  Laterality: N/A;  CRANIOTOMY HEMATOMA EVACUATION SUBDURAL  . NO PAST SURGERIES      . None  Family History  Problem Relation Age of Onset  . Stroke Father          Died, 80s  . Stroke Sister          Living, 38  . Heart disease Mother          Died, 42  . Healthy Daughter    . Diabetes Maternal Grandmother    . Hypertension Neg Hx     Social History:  reports that he quit smoking about 52 years ago. His smoking use included cigarettes. He has never used smokeless tobacco. He reports current alcohol use of about 2.0 standard drinks of alcohol per week. He reports that he does not use drugs. Allergies:  Allergies  Allergen Reactions  .  Lorazepam Anxiety            Facility-Administered Medications Prior to Admission  Medication Dose Route Frequency Provider Last Rate Last Dose  . testosterone cypionate (DEPOTESTOSTERONE CYPIONATE) injection 200 mg  200 mg Intramuscular Q14 Days Elayne Snare, MD   200 mg at 04/28/18 1529  . testosterone cypionate (DEPOTESTOSTERONE CYPIONATE) injection 200 mg  200 mg Intramuscular Q14 Days Janith Lima, MD   200 mg at 09/28/18 0840    Medications Prior to Admission  Medication Sig Dispense Refill  . acetaminophen (TYLENOL) 500 MG tablet Take 500 mg by mouth daily as needed (pain).      [name] ALPRAZolam (XANAX) 0.25 MG tablet 2  qhs (Patient taking differently: Take 0.5 mg by mouth at bedtime. ) 60 tablet 0  . aspirin 81 MG chewable tablet Chew 1 tablet (81 mg total) by mouth daily. 30 tablet 3  . atorvastatin (LIPITOR) 40 MG tablet Take 1 tablet (40 mg total) by mouth daily at 6 PM. 30 tablet 3  . buPROPion (WELLBUTRIN XL) 150 MG 24 hr tablet Take 1 tablet (150 mg total) by mouth daily. 2  qam (Patient taking differently: Take 150 mg by mouth daily. ) 30 tablet 2  . clobetasol cream (TEMOVATE) 0.05 % APPLY AS DIRECTED. (Patient taking differently: Apply 1 application topically daily as needed (Leg dryness). ) 30 g 0  . imipramine (TOFRANIL) 10 MG tablet 1 q hs (Patient taking differently: Take 10 mg by mouth at bedtime. ) 30 tablet 5  . levothyroxine (SYNTHROID) 88 MCG tablet TAKE 1 TABLET BY MOUTH DAILY. (Patient taking differently: Take 88 mcg by mouth every evening. ) 30 tablet 0  . metoprolol succinate (TOPROL-XL) 25 MG 24 hr tablet TAKE (1/2) TABLET DAILY. (Patient taking differently: Take 12.5 mg by mouth daily. ) 15 tablet 5  . nitroGLYCERIN (NITROSTAT) 0.4 MG SL tablet Place 1 tablet (0.4 mg total) under the tongue every 5 (five) minutes as needed for chest pain (CP or SOB). 25 tablet 12  . ramipril (ALTACE) 1.25 MG capsule Take 1 capsule (1.25 mg total) by mouth daily. 30 capsule 3  .  tamsulosin (FLOMAX) 0.4 MG CAPS capsule TAKE (1) CAPSULE DAILY. (Patient taking differently: Take 0.8 mg by mouth daily after supper. ) 30 capsule 3  . venlafaxine XR (EFFEXOR-XR) 37.5 MG 24 hr capsule Take 1 capsule (37.5 mg total) by mouth daily. 30 capsule 6     Drug Regimen Review Drug regimen was reviewed and remains appropriate with no significant issues identified   Home: Home Living Family/patient expects to be discharged to:: Private residence Living Arrangements: Spouse/significant other Available Help at Discharge: Family, Available 24 hours/day(wife) Type of Home: House Home Access: Level entry Home Layout:  Two level, Able to live on main level with bedroom/bathroom Bathroom Shower/Tub: Multimedia programmer: Handicapped height Bathroom Accessibility: Yes Home Equipment: Environmental consultant - 4 wheels  Lives With: Spouse   Functional History: Prior Function Level of Independence: Independent with assistive device(s) Comments: was using rollator for mobility but had multiple falls   Functional Status:  Mobility: Bed Mobility Overal bed mobility: Needs Assistance Bed Mobility: Supine to Sit Supine to sit: Min assist Sit to supine: Min guard General bed mobility comments: minA to scoot hips forward using bed pad, pt with initial posterior lean without foot support Transfers Overall transfer level: Needs assistance Equipment used: Rolling walker (2 wheeled) Transfers: Sit to/from Stand Sit to Stand: Min assist Stand pivot transfers: Mod assist General transfer comment: Pt requiring min assist to boost up from bed, cues for hand placement Ambulation/Gait Ambulation/Gait assistance: Min assist Gait Distance (Feet): 90 Feet Assistive device: Rolling walker (2 wheeled) Gait Pattern/deviations: Step-through pattern, Decreased stride length, Trunk flexed General Gait Details: Slow, speed, decreased bilateral foot clearance. Cues for walker proximity, pt unable to  significantly correct Gait velocity: decreased     ADL: ADL Overall ADL's : Needs assistance/impaired Eating/Feeding: NPO Grooming: Min guard, Wash/dry hands, Wash/dry face, Oral care, Brushing hair, Standing Upper Body Bathing: Set up, Sitting Lower Body Bathing: Min guard, Sitting/lateral leans, Sit to/from stand Upper Body Dressing : Set up, Sitting Lower Body Dressing: Min guard, Sitting/lateral leans, Sit to/from stand, Cueing for safety Toilet Transfer: Min guard, RW, Regular Toilet Toileting- Clothing Manipulation and Hygiene: Minimal assistance, Sitting/lateral lean, Sit to/from stand, Cueing for safety Functional mobility during ADLs: Min guard, Minimal assistance, Rolling walker, Cueing for safety, Cueing for sequencing General ADL Comments: Pt minA overall for ADL. Pt donning socks at EOB and requiring assist for proper toilet hygiene. Pt stood at sink for ADL with minguardA.   Cognition: Cognition Overall Cognitive Status: Impaired/Different from baseline Orientation Level: Oriented X4 Cognition Arousal/Alertness: Awake/alert Behavior During Therapy: WFL for tasks assessed/performed Overall Cognitive Status: Impaired/Different from baseline Area of Impairment: Problem solving Problem Solving: Slow processing General Comments: Pt not able to fully move around obstacles when asked to maneuver through hallway. Pt bumping into items on L side.    Physical Exam: Blood pressure (!) 82/59, pulse 68, temperature 98.7 F (37.1 C), temperature source Oral, resp. rate (!) 21, height 5' 7.5" (1.715 m), weight 87.1 kg, SpO2 97 %. Physical Exam  Constitutional: He is oriented to person, place, and time. No distress.  HENT:  Mouth/Throat: Oropharynx is clear and moist. No oropharyngeal exudate.  Right Craniotomy surgical site is CDI with staples  Eyes: Pupils are equal, round, and reactive to light. Right eye exhibits no discharge. Left eye exhibits no discharge.  Neck: Normal  range of motion. No tracheal deviation present. No thyromegaly present.  Cardiovascular: Normal rate and regular rhythm. Exam reveals no friction rub.  No murmur heard. Respiratory: Effort normal and breath sounds normal. No respiratory distress. He has no wheezes.  GI: Soft. He exhibits no distension. There is no abdominal tenderness.  Genitourinary:    Genitourinary Comments: Indwelling Foley catheter tube in place   Musculoskeletal:        General: No deformity or edema.  Neurological: He is alert and oriented to person, place, and time.  Alert/NAD.SItting up in bed.  Makes good eye contact with examiner.  He provides his name age and date of birth. Follows simple commands. Easily distracted. Left inattention. Moves all 4 limbs. Mild left  pronator drift. Motor 4/5 all 4. No focal sensory findings.   Skin: Skin is warm. No rash noted. He is not diaphoretic. No erythema.  Psychiatric:  Pleasant and cooperative      Lab Results Last 48 Hours        Results for orders placed or performed during the hospital encounter of 04/17/19 (from the past 48 hour(s))  I-STAT 7, (LYTES, BLD GAS, ICA, H+H)     Status: Abnormal    Collection Time: 04/19/19  5:32 PM  Result Value Ref Range    pH, Arterial 7.402 7.350 - 7.450    pCO2 arterial 45.0 32.0 - 48.0 mmHg    pO2, Arterial 419.0 (H) 83.0 - 108.0 mmHg    Bicarbonate 28.3 (H) 20.0 - 28.0 mmol/L    TCO2 30 22 - 32 mmol/L    O2 Saturation 100.0 %    Acid-Base Excess 3.0 (H) 0.0 - 2.0 mmol/L    Sodium 139 135 - 145 mmol/L    Potassium 3.8 3.5 - 5.1 mmol/L    Calcium, Ion 1.23 1.15 - 1.40 mmol/L    HCT 31.0 (L) 39.0 - 52.0 %    Hemoglobin 10.5 (L) 13.0 - 17.0 g/dL    Patient temperature 35.8 C      Sample type ARTERIAL    I-STAT 7, (LYTES, BLD GAS, ICA, H+H)     Status: Abnormal    Collection Time: 04/19/19  5:47 PM  Result Value Ref Range    pH, Arterial 7.435 7.350 - 7.450    pCO2 arterial 39.9 32.0 - 48.0 mmHg    pO2, Arterial 345.0 (H)  83.0 - 108.0 mmHg    Bicarbonate 27.3 20.0 - 28.0 mmol/L    TCO2 29 22 - 32 mmol/L    O2 Saturation 100.0 %    Acid-Base Excess 2.0 0.0 - 2.0 mmol/L    Sodium 139 135 - 145 mmol/L    Potassium 3.8 3.5 - 5.1 mmol/L    Calcium, Ion 1.23 1.15 - 1.40 mmol/L    HCT 31.0 (L) 39.0 - 52.0 %    Hemoglobin 10.5 (L) 13.0 - 17.0 g/dL    Patient temperature 35.2 C      Sample type ARTERIAL    I-STAT 7, (LYTES, BLD GAS, ICA, H+H)     Status: Abnormal    Collection Time: 04/19/19  6:22 PM  Result Value Ref Range    pH, Arterial 7.434 7.350 - 7.450    pCO2 arterial 38.4 32.0 - 48.0 mmHg    pO2, Arterial 173.0 (H) 83.0 - 108.0 mmHg    Bicarbonate 26.1 20.0 - 28.0 mmol/L    TCO2 27 22 - 32 mmol/L    O2 Saturation 100.0 %    Acid-Base Excess 1.0 0.0 - 2.0 mmol/L    Sodium 140 135 - 145 mmol/L    Potassium 3.8 3.5 - 5.1 mmol/L    Calcium, Ion 1.23 1.15 - 1.40 mmol/L    HCT 31.0 (L) 39.0 - 52.0 %    Hemoglobin 10.5 (L) 13.0 - 17.0 g/dL    Patient temperature 35.4 C      Sample type ARTERIAL    MRSA PCR Screening     Status: None    Collection Time: 04/19/19  7:54 PM    Specimen: Nasal Mucosa; Nasopharyngeal  Result Value Ref Range    MRSA by PCR NEGATIVE NEGATIVE      Comment:        The GeneXpert MRSA Assay (FDA approved  for NASAL specimens only), is one component of a comprehensive MRSA colonization surveillance program. It is not intended to diagnose MRSA infection nor to guide or monitor treatment for MRSA infections. Performed at [facility], [place] [address] [place],  [address]        Imaging Results (Last 48 hours)  Ct Head Wo Contrast   Result Date: 04/21/2019 CLINICAL DATA:  Subdural drainage EXAM: CT HEAD WITHOUT CONTRAST TECHNIQUE: Contiguous axial images were obtained from the base of the skull through the vertex without intravenous contrast. COMPARISON:  MRI head 04/17/2019 FINDINGS: Brain: Interval right-sided craniotomy and drainage of subdural hematoma.  Subdural drain in place. Mixed density high and low-density subdural hematoma on the right is significantly smaller now measuring approximately 4.5 mm in diameter. Subdural gas on the right. Improvement in midline shift now approximately 3 mm. Negative for acute infarct Generalized atrophy with ventricular enlargement. White matter ischemic changes appear chronic. Vascular: Negative for hyperdense vessel Skull: Right-sided craniotomy.  Craniotomy flap in good position. Sinuses/Orbits: Air-fluid level right maxillary sinus. Other: None IMPRESSION: Interval right craniotomy for drainage of subdural hematoma. Decrease in size of mixed density subdural hematoma. Improvement in midline shift now 3 mm. Electronically Signed   By: Franchot Gallo M.D.   On: 04/21/2019 09:04        Medical Problem List and Plan: 1.  Decreased functional mobility with left side weakness secondary to right hemispheric traumatic subacute and chronic subdural hematoma with multiple falls.  Status post right frontoparietal temporal craniotomy evacuation of subdural hematoma 04/19/2019             -admit to inpatient rehab 2.  Antithrombotics: -DVT/anticoagulation: SCDs             -antiplatelet therapy: N/A 3. Pain Management: Hydrocodone as needed 4. Mood: Tofranil 10 mg daily, Wellbutrin 150 mg daily, Effexor 37.5 mg daily, Xanax 0.5 mg nightly as needed             -antipsychotic agents: N/A 5. Neuropsych: This patient is capable of making decisions on his own behalf. 6. Skin/Wound Care: Routine skin checks 7. Fluids/Electrolytes/Nutrition: Routine in and outs with follow-up chemistries 8.  Hypertension.  Toprol 12.5 mg daily, Altace 1.25 mg daily.  Monitor with increased mobility 9.  BPH.  Noted urinary retention.  16 French coud catheter placed.  Patient followed by urology services Dr Gloriann Loan.               -Plan is currently to keep Foley tube until follow up office.               -Flomax 0.8 mg daily. 10.  Hypothyroidism.   Synthroid     Post Admission Physician Evaluation: 1. Functional deficits secondary  to right SDH. 2. Patient is admitted to receive collaborative, interdisciplinary care between the physiatrist, rehab nursing staff, and therapy team. 3. Patient's level of medical complexity and substantial therapy needs in context of that medical necessity cannot be provided at a lesser intensity of care such as a SNF. 4. Patient has experienced substantial functional loss from his/her baseline which was documented above under the "Functional History" and "Functional Status" headings.  Judging by the patient's diagnosis, physical exam, and functional history, the patient has potential for functional progress which will result in measurable gains while on inpatient rehab.  These gains will be of substantial and practical use upon discharge  in facilitating mobility and self-care at the household level. 5. Physiatrist will provide 24  hour management of medical needs as well as oversight of the therapy plan/treatment and provide guidance as appropriate regarding the interaction of the two. 6. The Preadmission Screening has been reviewed and patient status is unchanged unless otherwise stated above. 7. 24 hour rehab nursing will assist with bladder management, bowel management, safety, skin/wound care, disease management, medication administration, pain management and patient education  and help integrate therapy concepts, techniques,education, etc. 8. PT will assess and treat for/with: Lower extremity strength, range of motion, stamina, balance, functional mobility, safety, adaptive techniques and equipment, NMR.   Goals are: supervision. 9. OT will assess and treat for/with: ADL's, functional mobility, safety, upper extremity strength, adaptive techniques and equipment, NMR.   Goals are: supervision. Therapy may proceed with showering this patient. 10. SLP will assess and treat for/with: cognition and communication.   Goals are: supervision. 11. Case Management and Social Worker will assess and treat for psychological issues and discharge planning. 12. Team conference will be held weekly to assess progress toward goals and to determine barriers to discharge. 13. Patient will receive at least 3 hours of therapy per day at least 5 days per week. 14. ELOS: 7 days       15. Prognosis:  excellent   I have personally performed a face to face diagnostic evaluation of this patient and formulated the key components of the plan.  Additionally, I have personally reviewed laboratory data, imaging studies, as well as relevant notes and concur with the physician assistant's documentation above.  Meredith Staggers, MD, FAAPMR     Benjamin Paganini [place], PA-C 04/21/2019

## 2019-04-21 NOTE — Progress Notes (Signed)
Physical Therapy Treatment Patient Details Name: Benjamin Hahn MRN: [id number] DOB: September 07, 1932 Today's Date: 04/21/2019    History of Present Illness Pt is an 85 y/o male admitted secondary to multiple falls. Found to have Large R subdural hematoma. S/p right frontoparietaltemporal craniotomy and evacuation of subdural hematoma 8/26. PMH includes arthrits and temporal artertitis.     PT Comments     Pt reporting feeling fatigued from not sleeping well, although still eager and motivated to participate in therapy. Maintaining current level of mobility, ambulating 90 feet with Rollator and min assist, although requiring increased assist for transfers today. Displays slow, shuffling gait pattern. BP soft, 75/55 during ambulation, 82/59 post, pt reports positive dizziness. RN/MD aware. Remains an excellent candidate for CIR to address deficits and maximize functional independence.    Follow Up Recommendations  CIR;Supervision/Assistance - 24 hour     Equipment Recommendations  None recommended by PT    Recommendations for Other Services       Precautions / Restrictions Precautions Precautions: Fall;Other (comment) Precaution Comments: drain Restrictions Weight Bearing Restrictions: No    Mobility  Bed Mobility Overal bed mobility: Needs Assistance Bed Mobility: Supine to Sit     Supine to sit: Min assist     General bed mobility comments: minA to scoot hips forward using bed pad, pt with initial posterior/right lean without foot support. Increased time and effort  Transfers Overall transfer level: Needs assistance Equipment used: 4-wheeled walker Transfers: Sit to/from Stand Sit to Stand: Mod assist         General transfer comment: ModA to stand from edge of bed and toilet, cues for hand placement.  Ambulation/Gait Ambulation/Gait assistance: Min assist Gait Distance (Feet): 90 Feet Assistive device: 4-wheeled walker Gait Pattern/deviations: Step-through  pattern;Decreased stride length;Trunk flexed;Shuffle Gait velocity: decreased   General Gait Details: Pt with increased shuffling gait, decreased stride length, cues for proximity to walker. Difficulty with transitions and turns, requiring manual assist for Rollator    Stairs             Wheelchair Mobility    Modified Rankin (Stroke Patients Only) Modified Rankin (Stroke Patients Only) Pre-Morbid Rankin Score: Moderate disability Modified Rankin: Moderately severe disability     Balance Overall balance assessment: Needs assistance Sitting-balance support: No upper extremity supported;Feet supported Sitting balance-Leahy Scale: Fair Sitting balance - Comments: Supervision for safety, initial posterior lean with feet unsupported   Standing balance support: Bilateral upper extremity supported;During functional activity Standing balance-Leahy Scale: Poor Standing balance comment: Reliant on BUE and external support                            Cognition Arousal/Alertness: Awake/alert Behavior During Therapy: WFL for tasks assessed/performed Overall Cognitive Status: Impaired/Different from baseline Area of Impairment: Problem solving                             Problem Solving: Slow processing        Exercises      General Comments        Pertinent Vitals/Pain Pain Assessment: No/denies pain    Home Living     Available Help at Discharge: Family;Available 24 hours/day(wife)                Prior Function            PT Goals (current goals can now be found in the care plan  section) Acute Rehab PT Goals Patient Stated Goal: to get home PT Goal Formulation: With patient Time For Goal Achievement: 05/04/19 Potential to Achieve Goals: Good Progress towards PT goals: Progressing toward goals    Frequency    Min 3X/week      PT Plan Current plan remains appropriate    Co-evaluation              AM-PAC PT "6  Clicks" Mobility   Outcome Measure  Help needed turning from your back to your side while in a flat bed without using bedrails?: None Help needed moving from lying on your back to sitting on the side of a flat bed without using bedrails?: A Little Help needed moving to and from a bed to a chair (including a wheelchair)?: A Little Help needed standing up from a chair using your arms (e.g., wheelchair or bedside chair)?: A Little Help needed to walk in hospital room?: A Little Help needed climbing 3-5 steps with a railing? : A Lot 6 Click Score: 18    End of Session Equipment Utilized During Treatment: Gait belt Activity Tolerance: Patient tolerated treatment well Patient left: in chair;with call bell/phone within reach;with chair alarm set Nurse Communication: Mobility status PT Visit Diagnosis: Unsteadiness on feet (R26.81);Muscle weakness (generalized) (M62.81);History of falling (Z91.81);Repeated falls (R29.6)     Time: [id number] PT Time Calculation (min) (ACUTE ONLY): 31 min  Charges:  $Gait Training: 8-22 mins $Therapeutic Activity: 8-22 mins                     Ellamae Sia, PT, DPT Acute Rehabilitation Services Pager [phone] Office [phone]    Benjamin Hahn 04/21/2019, 11:55 AM

## 2019-04-21 NOTE — H&P (Signed)
Physical Medicine and Rehabilitation Admission H&P   CC-Headache   HPI: Benjamin Hahn. Benjamin Hahn is a 85 year old right-handed male with history of BPH, hypertension, hyperlipidemia and occasional alcohol.  Per chart review patient lives with spouse.  Two-level home with bedroom on Main level with level entry to the home.  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  Presented 04/17/2019 with reported multiple falls in the past 2 months no loss of consciousness as well as left hemiparesis.[name]  An MRI showed a large right cerebral convexity subdural hematoma with a 7 mm leftward midline shift identified.  Underwent right frontoparietal temporal craniotomy and evacuation of subdural hematoma 04/19/2019 per Dr. Sherwood Hahn.  Follow-up CT of the brain 04/21/2019 shows decrease in size of subdural improvement in midline shift and plan to remove Jackson-Pratt drain.  Hospital course urinary retention with history of BPH and bladder scan 682 cc with a 16 French coud catheter placed and plans to continue Foley tube until follow-up with urology services as patient has been followed by Dr. Gaynelle Hahn in the past.  Patient was placed on Flomax.  Tolerating a regular diet.  Therapy evaluations completed and patient was admitted for a comprehensive rehab program   Review of Systems  Constitutional: Negative for chills and fever.  HENT: Negative for hearing loss.   Eyes: Negative for blurred vision and double vision.  Respiratory: Negative for cough and shortness of breath.   Cardiovascular: Negative for chest pain, palpitations and leg swelling.  Gastrointestinal: Positive for constipation. Negative for heartburn, nausea and vomiting.       GERD  Genitourinary: Positive for frequency and urgency. Negative for dysuria, flank pain and hematuria.  Musculoskeletal: Positive for falls and myalgias.  Skin: Negative for rash.  Psychiatric/Behavioral:       Anxiety  All other systems reviewed and are  negative   Past Medical History:  Diagnosis Date   Anxiety    Arthritis    might be in back, no problems   BPH (benign prostatic hyperplasia)    Depression 1990s   GERD (gastroesophageal reflux disease)    occasional   Hypercholesteremia    controled   Hypothyroid    Shingles May 2014   "Right face, still has some"   Temporal arteritis (Benjamin Hahn)    Transient blindness of both eyes    Past Surgical History:  Procedure Laterality Date   ARTERY BIOPSY Right 06/20/2013   Procedure: BIOPSY TEMPORAL ARTERY RIGHT;  Surgeon: Benjamin Regal, MD;  Location: [facility];  Service: General;  Laterality: Right;   CARDIAC CATHETERIZATION N/A 09/02/2015   Procedure: Left Heart Cath and Coronary Angiography;  Surgeon: Benjamin Forward, MD;  Location: [facility];  Service: Cardiovascular;  Laterality: N/A;   CARDIAC CATHETERIZATION N/A 09/02/2015   Procedure: Coronary Stent Intervention;  Surgeon: Benjamin Forward, MD;  Location: [facility];  Service: Cardiovascular;  Laterality: N/A;  1.  mid RCA      (3.0/28mm Xience) 2.  Mid LAD      (3.0/23mm Xience)   CRANIOTOMY N/A 04/19/2019   Procedure: CRANIOTOMY HEMATOMA EVACUATION SUBDURAL;  Surgeon: Benjamin Gamma, MD;  Location: [facility];  Service: Neurosurgery;  Laterality: N/A;  CRANIOTOMY HEMATOMA EVACUATION SUBDURAL   NO PAST SURGERIES     None     Family History  Problem Relation Age of Onset   Stroke Father        Died, 16s   Stroke Sister        Living,  19   Heart disease Mother        Died, 65   Healthy Daughter    Diabetes Maternal Grandmother    Hypertension Neg Hx    Social History:  reports that he quit smoking about 52 years ago. His smoking use included cigarettes. He has never used smokeless tobacco. He reports current alcohol use of about 2.0 standard drinks of alcohol per week. He reports that he does not use drugs. Allergies:  Allergies  Allergen Reactions   Lorazepam Anxiety   Facility-Administered  Medications Prior to Admission  Medication Dose Route Frequency Provider Last Rate Last Dose   testosterone cypionate (DEPOTESTOSTERONE CYPIONATE) injection 200 mg  200 mg Intramuscular Q14 Days Benjamin Snare, MD   200 mg at 04/28/18 1529   testosterone cypionate (DEPOTESTOSTERONE CYPIONATE) injection 200 mg  200 mg Intramuscular Q14 Days Benjamin Lima, MD   200 mg at 09/28/18 0840   Medications Prior to Admission  Medication Sig Dispense Refill   acetaminophen (TYLENOL) 500 MG tablet Take 500 mg by mouth daily as needed (pain).     ALPRAZolam (XANAX) 0.25 MG tablet 2  qhs (Patient taking differently: Take 0.5 mg by mouth at bedtime. ) 60 tablet 0   aspirin 81 MG chewable tablet Chew 1 tablet (81 mg total) by mouth daily. 30 tablet 3   atorvastatin (LIPITOR) 40 MG tablet Take 1 tablet (40 mg total) by mouth daily at 6 PM. 30 tablet 3   buPROPion (WELLBUTRIN XL) 150 MG 24 hr tablet Take 1 tablet (150 mg total) by mouth daily. 2  qam (Patient taking differently: Take 150 mg by mouth daily. ) 30 tablet 2   clobetasol cream (TEMOVATE) 0.05 % APPLY AS DIRECTED. (Patient taking differently: Apply 1 application topically daily as needed (Leg dryness). ) 30 g 0   imipramine (TOFRANIL) 10 MG tablet 1 q hs (Patient taking differently: Take 10 mg by mouth at bedtime. ) 30 tablet 5   levothyroxine (SYNTHROID) 88 MCG tablet TAKE 1 TABLET BY MOUTH DAILY. (Patient taking differently: Take 88 mcg by mouth every evening. ) 30 tablet 0   metoprolol succinate (TOPROL-XL) 25 MG 24 hr tablet TAKE (1/2) TABLET DAILY. (Patient taking differently: Take 12.5 mg by mouth daily. ) 15 tablet 5   nitroGLYCERIN (NITROSTAT) 0.4 MG SL tablet Place 1 tablet (0.4 mg total) under the tongue every 5 (five) minutes as needed for chest pain (CP or SOB). 25 tablet 12   ramipril (ALTACE) 1.25 MG capsule Take 1 capsule (1.25 mg total) by mouth daily. 30 capsule 3   tamsulosin (FLOMAX) 0.4 MG CAPS capsule TAKE (1) CAPSULE  DAILY. (Patient taking differently: Take 0.8 mg by mouth daily after supper. ) 30 capsule 3   venlafaxine XR (EFFEXOR-XR) 37.5 MG 24 hr capsule Take 1 capsule (37.5 mg total) by mouth daily. 30 capsule 6    Drug Regimen Review Drug regimen was reviewed and remains appropriate with no significant issues identified  Home: Home Living Family/patient expects to be discharged to:: Private residence Living Arrangements: Spouse/significant other Available Help at Discharge: Family, Available 24 hours/day(wife) Type of Home: House Home Access: Level entry Home Layout: Two level, Able to live on main level with bedroom/bathroom Bathroom Shower/Tub: Multimedia programmer: Handicapped height Bathroom Accessibility: Yes Home Equipment: Environmental consultant - 4 wheels  Lives With: Spouse   Functional History: Prior Function Level of Independence: Independent with assistive device(s) Comments: was using rollator for mobility but had multiple falls  Functional  Status:  Mobility: Bed Mobility Overal bed mobility: Needs Assistance Bed Mobility: Supine to Sit Supine to sit: Min assist Sit to supine: Min guard General bed mobility comments: minA to scoot hips Hahn using bed pad, pt with initial posterior lean without foot support Transfers Overall transfer level: Needs assistance Equipment used: Rolling walker (2 wheeled) Transfers: Sit to/from Stand Sit to Stand: Min assist Stand pivot transfers: Mod assist General transfer comment: Pt requiring min assist to boost up from bed, cues for hand placement Ambulation/Gait Ambulation/Gait assistance: Min assist Gait Distance (Feet): 90 Feet Assistive device: Rolling walker (2 wheeled) Gait Pattern/deviations: Step-through pattern, Decreased stride length, Trunk flexed General Gait Details: Slow, speed, decreased bilateral foot clearance. Cues for walker proximity, pt unable to significantly correct Gait velocity: decreased     ADL: ADL Overall ADL's : Needs assistance/impaired Eating/Feeding: NPO Grooming: Min guard, Wash/dry hands, Wash/dry face, Oral care, Brushing hair, Standing Upper Body Bathing: Set up, Sitting Lower Body Bathing: Min guard, Sitting/lateral leans, Sit to/from stand Upper Body Dressing : Set up, Sitting Lower Body Dressing: Min guard, Sitting/lateral leans, Sit to/from stand, Cueing for safety Toilet Transfer: Min guard, RW, Regular Toilet Toileting- Clothing Manipulation and Hygiene: Minimal assistance, Sitting/lateral lean, Sit to/from stand, Cueing for safety Functional mobility during ADLs: Min guard, Minimal assistance, Rolling walker, Cueing for safety, Cueing for sequencing General ADL Comments: Pt minA overall for ADL. Pt donning socks at EOB and requiring assist for proper toilet hygiene. Pt stood at sink for ADL with minguardA.  Cognition: Cognition Overall Cognitive Status: Impaired/Different from baseline Orientation Level: Oriented X4 Cognition Arousal/Alertness: Awake/alert Behavior During Therapy: WFL for tasks assessed/performed Overall Cognitive Status: Impaired/Different from baseline Area of Impairment: Problem solving Problem Solving: Slow processing General Comments: Pt not able to fully move around obstacles when asked to maneuver through hallway. Pt bumping into items on L side.   Physical Exam: Blood pressure (!) 82/59, pulse 68, temperature 98.7 F (37.1 C), temperature source Oral, resp. rate (!) 21, height 5' 7.5" (1.715 m), weight 87.1 kg, SpO2 97 %. Physical Exam  Constitutional: He is oriented to person, place, and time. No distress.  HENT:  Mouth/Throat: Oropharynx is clear and moist. No oropharyngeal exudate.  Right Craniotomy surgical site is CDI with staples  Eyes: Pupils are equal, round, and reactive to light. Right eye exhibits no discharge. Left eye exhibits no discharge.  Neck: Normal range of motion. No tracheal deviation present. No  thyromegaly present.  Cardiovascular: Normal rate and regular rhythm. Exam reveals no friction rub.  No murmur heard. Respiratory: Effort normal and breath sounds normal. No respiratory distress. He has no wheezes.  GI: Soft. He exhibits no distension. There is no abdominal tenderness.  Genitourinary:    Genitourinary Comments: Indwelling Foley catheter tube in place   Musculoskeletal:        General: No deformity or edema.  Neurological: He is alert and oriented to person, place, and time.  Alert/NAD.SItting up in bed.  Makes good eye contact with examiner.  He provides his name age and date of birth. Follows simple commands. Easily distracted. Left inattention. Moves all 4 limbs. Mild left pronator drift. Motor 4/5 all 4. No focal sensory findings.   Skin: Skin is warm. No rash noted. He is not diaphoretic. No erythema.  Psychiatric:  Pleasant and cooperative    Results for orders placed or performed during the hospital encounter of 04/17/19 (from the past 48 hour(s))  I-STAT 7, (LYTES, BLD GAS, ICA, H+H)  Status: Abnormal   Collection Time: 04/19/19  5:32 PM  Result Value Ref Range   pH, Arterial 7.402 7.350 - 7.450   pCO2 arterial 45.0 32.0 - 48.0 mmHg   pO2, Arterial 419.0 (H) 83.0 - 108.0 mmHg   Bicarbonate 28.3 (H) 20.0 - 28.0 mmol/L   TCO2 30 22 - 32 mmol/L   O2 Saturation 100.0 %   Acid-Base Excess 3.0 (H) 0.0 - 2.0 mmol/L   Sodium 139 135 - 145 mmol/L   Potassium 3.8 3.5 - 5.1 mmol/L   Calcium, Ion 1.23 1.15 - 1.40 mmol/L   HCT 31.0 (L) 39.0 - 52.0 %   Hemoglobin 10.5 (L) 13.0 - 17.0 g/dL   Patient temperature 35.8 C    Sample type ARTERIAL   I-STAT 7, (LYTES, BLD GAS, ICA, H+H)     Status: Abnormal   Collection Time: 04/19/19  5:47 PM  Result Value Ref Range   pH, Arterial 7.435 7.350 - 7.450   pCO2 arterial 39.9 32.0 - 48.0 mmHg   pO2, Arterial 345.0 (H) 83.0 - 108.0 mmHg   Bicarbonate 27.3 20.0 - 28.0 mmol/L   TCO2 29 22 - 32 mmol/L   O2 Saturation 100.0 %    Acid-Base Excess 2.0 0.0 - 2.0 mmol/L   Sodium 139 135 - 145 mmol/L   Potassium 3.8 3.5 - 5.1 mmol/L   Calcium, Ion 1.23 1.15 - 1.40 mmol/L   HCT 31.0 (L) 39.0 - 52.0 %   Hemoglobin 10.5 (L) 13.0 - 17.0 g/dL   Patient temperature 35.2 C    Sample type ARTERIAL   I-STAT 7, (LYTES, BLD GAS, ICA, H+H)     Status: Abnormal   Collection Time: 04/19/19  6:22 PM  Result Value Ref Range   pH, Arterial 7.434 7.350 - 7.450   pCO2 arterial 38.4 32.0 - 48.0 mmHg   pO2, Arterial 173.0 (H) 83.0 - 108.0 mmHg   Bicarbonate 26.1 20.0 - 28.0 mmol/L   TCO2 27 22 - 32 mmol/L   O2 Saturation 100.0 %   Acid-Base Excess 1.0 0.0 - 2.0 mmol/L   Sodium 140 135 - 145 mmol/L   Potassium 3.8 3.5 - 5.1 mmol/L   Calcium, Ion 1.23 1.15 - 1.40 mmol/L   HCT 31.0 (L) 39.0 - 52.0 %   Hemoglobin 10.5 (L) 13.0 - 17.0 g/dL   Patient temperature 35.4 C    Sample type ARTERIAL   MRSA PCR Screening     Status: None   Collection Time: 04/19/19  7:54 PM   Specimen: Nasal Mucosa; Nasopharyngeal  Result Value Ref Range   MRSA by PCR NEGATIVE NEGATIVE    Comment:        The GeneXpert MRSA Assay (FDA approved for NASAL specimens only), is one component of a comprehensive MRSA colonization surveillance program. It is not intended to diagnose MRSA infection nor to guide or monitor treatment for MRSA infections. Performed at [facility], [place] [address], [place], [place] [address]    Ct Head Wo Contrast  Result Date: 04/21/2019 CLINICAL DATA:  Subdural drainage EXAM: CT HEAD WITHOUT CONTRAST TECHNIQUE: Contiguous axial images were obtained from the base of the skull through the vertex without intravenous contrast. COMPARISON:  MRI head 04/17/2019 FINDINGS: Brain: Interval right-sided craniotomy and drainage of subdural hematoma. Subdural drain in place. Mixed density high and low-density subdural hematoma on the right is significantly smaller now measuring approximately 4.5 mm in diameter. Subdural gas on the right.  Improvement in midline shift now  approximately 3 mm. Negative for acute infarct Generalized atrophy with ventricular enlargement. White matter ischemic changes appear chronic. Vascular: Negative for hyperdense vessel Skull: Right-sided craniotomy.  Craniotomy flap in good position. Sinuses/Orbits: Air-fluid level right maxillary sinus. Other: None IMPRESSION: Interval right craniotomy for drainage of subdural hematoma. Decrease in size of mixed density subdural hematoma. Improvement in midline shift now 3 mm. Electronically Signed   By: Franchot Gallo M.D.   On: 04/21/2019 09:04     Medical Problem List and Plan: 1.  Decreased functional mobility with left side weakness secondary to right hemispheric traumatic subacute and chronic subdural hematoma with multiple falls.  Status post right frontoparietal temporal craniotomy evacuation of subdural hematoma 04/19/2019  -admit to inpatient rehab 2.  Antithrombotics: -DVT/anticoagulation: SCDs             -antiplatelet therapy: N/A 3. Pain Management: Hydrocodone as needed 4. Mood: Tofranil 10 mg daily, Wellbutrin 150 mg daily, Effexor 37.5 mg daily, Xanax 0.5 mg nightly as needed             -antipsychotic agents: N/A 5. Neuropsych: This patient is capable of making decisions on his own behalf. 6. Skin/Wound Care: Routine skin checks 7. Fluids/Electrolytes/Nutrition: Routine in and outs with follow-up chemistries 8.  Hypertension.  Toprol 12.5 mg daily, Altace 1.25 mg daily.  Monitor with increased mobility 9.  BPH.  Noted urinary retention.  16 French coud catheter placed.  Patient followed by urology services Dr Gloriann Loan.    -Plan is currently to keep Foley tube until follow up office.    -Flomax 0.8 mg daily. 10.  Hypothyroidism.  Synthroid      Lavon Paganini Angiulli, PA-C 04/21/2019

## 2019-04-21 NOTE — Progress Notes (Signed)
  NEUROSURGERY PROGRESS NOTE   Repeat head CT reviewed by Dr [name] which shows continued improvement. JP Drain removed at DR [facility] request in clean fashion. Incision was closed with three staples in sterile fashion. Patient tolerate well. No complications. No signs of bleeding/drainage after incision closed.  Ferne Reus, PA-C [facility] [employer]

## 2019-04-21 NOTE — Progress Notes (Signed)
Patient ID: Benjamin Hahn, male   DOB: 02-22-1933, 85 y.o.   MRN: [id number] Patient was admitted to 4W05 via bed, escorted by nursing staff.  Patient verbalized understanding of rehab process including fall prevention policy, personal belongings policy, and visitation policy.  Patient appears to be in no immediate distress at this time.  Incision to head intact with staples.  Foley catheter noted with small amount of yellowish drainage at tip of penis.  Brita Romp, RN

## 2019-04-21 NOTE — Progress Notes (Addendum)
Pt's BP in the 90s after returning from CT. While ambulating pt's SBP dropped to the 70s, now 82/59 sitting in chair. Informed MD [name]. Verbal order received by this RN for 500 mL saline bolus. Will hold daily metoprolol and ramipril at this time, per MD [facility].

## 2019-04-21 NOTE — Progress Notes (Signed)
Inpatient Rehabilitation Admissions Coordinator Spoke with Dr. Rita Ohara and aware of need for Fluid bolus. Will plan to admit to inpt rehab today once IVF bolus and BP stabilization. Vinnie, PA to remove drain today. I will make the arrangements to admit to CIR today.  Danne Baxter, RN, MSN Rehab Admissions Coordinator [phone] 04/21/2019 10:29 AM

## 2019-04-21 NOTE — Progress Notes (Signed)
  Spoke with Dr. Link Snuffer from [facility].  I discussed with him the patient is longstanding neurologic difficulties, and current difficulties with urinary retention.  He recommended that the Coude catheter be left in place for 5-7 days, and then the patient can follow up with him at [facility] for a voiding trial.  Plan: Discussed with patient's nurse and  Danne Baxter, admission coordinator for inpatient rehab.  Hosie Spangle, MD 04/21/2019, 10:45 AM

## 2019-04-21 NOTE — Progress Notes (Signed)
Subjective: Patient resting comfortably in bed, without complaints.  Had urinary retention following Foley being DC'd.  Standard 16 French Foley could not be passed, and 16 [place] coud catheter placed.  Patient has been followed at [facility] urology, and I will contact them for follow-up.  For now we will leave the Foley catheter in place.  He does continue on tamsulosin 0.8 mg daily.  Objective: Vital signs in last 24 hours: Vitals:   04/21/19 0400 04/21/19 0500 04/21/19 0600 04/21/19 0700  BP: (!) 117/55 (!) 108/54 (!) 101/56 (!) 100/54  Pulse: 67 63 61 61  Resp: 18 18 17 18   Temp: 98.7 F (37.1 C)     TempSrc: Oral     SpO2: 97% 97% 100% 97%  Weight:      Height:        Intake/Output from previous day: 08/27 0701 - 08/28 0700 In: 1534.7 [P.O.:750; I.V.:784.7] Out: 1665 [Urine:1515; Drains:150] Intake/Output this shift: No intake/output data recorded.  Physical Exam: Awake and alert, oriented.  Following commands.  Moving all 4 extremities well.  Drainage in Jackson-Pratt drain becoming increasingly CSF and decreasingly blood, and overall volume of drainage diminishing.  CBC Recent Labs    04/19/19 1747 04/19/19 1822  HGB 10.5* 10.5*  HCT 31.0* 31.0*   BMET Recent Labs    04/19/19 0400  04/19/19 1747 04/19/19 1822  NA 139   < > 139 140  K 4.0   < > 3.8 3.8  CL 104  --   --   --   CO2 28  --   --   --   GLUCOSE 103*  --   --   --   BUN 21  --   --   --   CREATININE 1.29*  --   --   --   CALCIUM 9.0  --   --   --    < > = values in this interval not displayed.    Assessment/Plan: Patient continues to do well.  Continuing PT and OT.  We will check CT of brain without contrast today, and likely remove Jackson-Pratt drain.  He should be ready for transfer to CIR either late today or tomorrow.  Hosie Spangle, MD 04/21/2019, 7:22 AM'

## 2019-04-22 ENCOUNTER — Inpatient Hospital Stay (HOSPITAL_COMMUNITY): Payer: Medicare Other | Admitting: Occupational Therapy

## 2019-04-22 ENCOUNTER — Inpatient Hospital Stay (HOSPITAL_COMMUNITY): Payer: Medicare Other | Admitting: Physical Therapy

## 2019-04-22 ENCOUNTER — Inpatient Hospital Stay (HOSPITAL_COMMUNITY): Payer: Medicare Other | Admitting: Speech Pathology

## 2019-04-22 MED ORDER — IMIPRAMINE HCL 10 MG PO TABS
10.0000 mg | ORAL_TABLET | Freq: Every day | ORAL | Status: DC
  Administered 2019-04-22 – 2019-05-05 (×14): 10 mg via ORAL
  Filled 2019-04-22 (×14): qty 1

## 2019-04-22 NOTE — Evaluation (Signed)
Occupational Therapy Assessment and Plan  Patient Details  Name: Benjamin Hahn MRN: [id number] Date of Birth: 04-07-1933  OT Diagnosis: abnormal posture, acute pain, cognitive deficits and muscle weakness (generalized) Rehab Potential: Rehab Potential (ACUTE ONLY): Excellent ELOS: 10-12 days   Today's Date: 04/22/2019 OT Individual Time: 3875-6433 OT Individual Time Calculation (min): 77 min     Problem List:  Patient Active Problem List   Diagnosis Date Noted  . Traumatic subdural hematoma ([place]) 04/21/2019  . Subdural hematoma ([place]) 04/17/2019  . Confusion 04/15/2019  . Urinary frequency 04/15/2019  . Memory changes 04/15/2019  . Hyperglycemia 04/15/2019  . Shuffling gait 04/15/2019  . Weakness generalized 04/14/2019  . Mild intermittent asthma with acute exacerbation 02/08/2018  . Hypothyroidism 03/14/2013  . Hyperlipidemia LDL goal <70 03/14/2013  . BPH (benign prostatic hypertrophy) 03/14/2013  . Hypogonadism, male 03/14/2013  . Erectile dysfunction 03/14/2013    Past Medical History:  Past Medical History:  Diagnosis Date  . Anxiety   . Arthritis    might be in back, no problems  . BPH (benign prostatic hyperplasia)   . Depression 1990s  . GERD (gastroesophageal reflux disease)    occasional  . Hypercholesteremia    controled  . Hypothyroid   . Shingles May 2014   "Right face, still has some"  . Temporal arteritis ([place])   . Transient blindness of both eyes    Past Surgical History:  Past Surgical History:  Procedure Laterality Date  . ARTERY BIOPSY Right 06/20/2013   Procedure: BIOPSY TEMPORAL ARTERY RIGHT;  Surgeon: Earnstine Regal, MD;  Location: [facility];  Service: General;  Laterality: Right;  . CARDIAC CATHETERIZATION N/A 09/02/2015   Procedure: Left Heart Cath and Coronary Angiography;  Surgeon: Charolette Forward, MD;  Location: [facility];  Service: Cardiovascular;  Laterality: N/A;  . CARDIAC CATHETERIZATION N/A 09/02/2015   Procedure: Coronary  Stent Intervention;  Surgeon: Charolette Forward, MD;  Location: [facility];  Service: Cardiovascular;  Laterality: N/A;  1.  mid RCA      (3.0/28mm Xience) 2.  Mid LAD      (3.0/23mm Xience)  . CRANIOTOMY N/A 04/19/2019   Procedure: CRANIOTOMY HEMATOMA EVACUATION SUBDURAL;  Surgeon: Jovita Gamma, MD;  Location: [facility];  Service: Neurosurgery;  Laterality: N/A;  CRANIOTOMY HEMATOMA EVACUATION SUBDURAL  . None      Assessment & Plan Clinical Impression: Benjamin Hahn is a 85 year old right-handed male with history of BPH,hypertension,hyperlipidemia and occasional alcohol. Per chart review patient lives with spouse. Two-level home with bedroom on Main level with level entry to the home. 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 Underwent right frontoparietal temporal craniotomy and evacuation of subdural hematoma 04/19/2019 per Dr. [name]. Follow-up CT of the brain 04/21/2019 shows decrease in size of subdural improvement in midline shiftand plan to remove Jackson-Pratt drain. Hospital course urinary retention with history of BPH and bladder scan 682 cc with a 16 French coud catheter placed and plans to continue Foley tube until follow-up with urology services as patient has been followed by Dr. [name] in the past. Patient was placed on Flomax. Tolerating a regular diet. Therapy evaluations completed and patient was admitted for a comprehensive rehab program    Patient currently requires mod with basic self-care  skills secondary to muscle weakness, decreased cardiorespiratoy endurance, decreased coordination, decreased attention to left and decreased standing balance, decreased postural control and decreased balance strategies.  Prior  to hospitalization, patient could complete BADLs with modified independent .  Patient will benefit from skilled intervention to increase independence with basic self-care skills prior to discharge home with spouse.  Anticipate patient will require 24 hour supervision and follow up home health.  OT - End of Session Endurance Deficit: Yes OT Assessment Rehab Potential (ACUTE ONLY): Excellent OT Barriers to Discharge: Medical stability OT Patient demonstrates impairments in the following area(s): Balance;Cognition;Endurance;Motor;Pain;Perception;Safety;Skin Integrity OT Basic ADL's Functional Problem(s): Eating;Grooming;Bathing;Dressing;Toileting OT Advanced ADL's Functional Problem(s): Simple Meal Preparation OT Transfers Functional Problem(s): Toilet;Tub/Shower OT Additional Impairment(s): None OT Plan OT Intensity: Minimum of 1-2 x/day, 45 to 90 minutes OT Frequency: 5 out of 7 days OT Duration/Estimated Length of Stay: 10-12 days OT Treatment/Interventions: Balance/vestibular training;DME/adaptive equipment instruction;Patient/family education;Therapeutic Activities;Wheelchair propulsion/positioning;Cognitive remediation/compensation;Psychosocial support;Therapeutic Exercise;UE/LE Strength taining/ROM;Self Care/advanced ADL retraining;Functional mobility training;Community reintegration;Discharge planning;Neuromuscular re-education;UE/LE Coordination activities;Disease mangement/prevention;Pain management;Visual/perceptual remediation/compensation OT Self Feeding Anticipated Outcome(s): Supervision/cuing OT Basic Self-Care Anticipated Outcome(s): Supervision/cuing OT Toileting Anticipated Outcome(s): Supervision/cuing OT Bathroom Transfers Anticipated Outcome(s): Supervision/cuing OT Recommendation Patient destination: Home Follow Up Recommendations: Home health OT Equipment Recommended: To be determined Skilled Therapeutic Intervention Skilled OT session completed with focus on initial  evaluation, education on OT role/POC, and establishment of patient centered goals.   Pt greeted in bed with c/o 6/10 HA pain. RN in at start of session for providing pain medicine. He completed shaving (sitting at the sink), bathing/dressing (w/c level at sink, sit<stand) and toilet transfer (for practice only) during session. All functional transfers completed at ambulatory level with Min A using RW, Mod A for power up from lower surfaces. Vcs for standing closer to walker and for avoiding a few obstacles on Lt side. Pt needed Mod A for shaving due to tremulous R UE activity. Min A for sit<stand with vcs for hand placement and Mod A for dynamic balance due to posterior lean. Pt with STM deficits, often needing instructions repeated. Mod A for LB dressing overall with pt able to obtain figure 4 bilaterally. At end of session pt returned to bed. Reported increased dizziness post ambulation into bathroom. BP in bed 113/55. He also reported increased nausea at this time and wanted something from RN to address. Notified RN of both BP read and pts request for antinausea medicine. Pt was left comfortably in bed with all needs within reach and safety belt fastened.   OT Evaluation Precautions/Restrictions  Precautions Precautions: Fall Precaution Comments: Rt crani General Chart Reviewed: Yes Family/Caregiver Present: No Pain Pain Assessment Pain Scale: 0-10 Pain Score: 0-No pain Home Living/Prior Functioning Home Living Family/patient expects to be discharged to:: Private residence Living Arrangements: Spouse/significant other Available Help at Discharge: Family, Available 24 hours/day Type of Home: House Home Access: Level entry Home Layout: Two level, Able to live on main level with bedroom/bathroom Bathroom Shower/Tub: Multimedia programmer: Handicapped height Bathroom Accessibility: Yes  Lives With: Spouse IADL History Homemaking Responsibilities: Yes Meal Prep Responsibility:  No Laundry Responsibility: No Cleaning Responsibility: No Homemaking Comments: Pt reports he was responsible for taking out the garbage, but spouse took care of most other IADLs. Education: Conservation officer, historic buildings Occupation: Part time employment Type of Occupation: Chief Executive Officer Leisure and Hobbies: Used to play golf before B RTC issues. Likes to write poetry  Prior Function Level of Independence: Requires assistive device for independence, Independent with basic ADLs (used  rollator PTA) Driving: Yes Vocation: Part time employment ADL ADL Eating: Not assessed Grooming: Moderate assistance(shaving) Where Assessed-Grooming: Sitting at sink Upper Body Bathing: Supervision/safety Where Assessed-Upper Body Bathing: Sitting at sink Lower Body Bathing: Minimal assistance Where Assessed-Lower Body Bathing: Sitting at sink, Standing at sink Upper Body Dressing: Supervision/safety Where Assessed-Upper Body Dressing: Sitting at sink Lower Body Dressing: Moderate assistance Where Assessed-Lower Body Dressing: Sitting at sink, Standing at sink Toileting: Not assessed Toilet Transfer: Moderate assistance Toilet Transfer Method: Counselling psychologist: Energy manager: Not assessed Vision Baseline Vision/History: Wears glasses Wears Glasses: Reading only Patient Visual Report: No change from baseline Vision Assessment?: No apparent visual deficits Perception  Perception: Impaired Inattention/Neglect: Does not attend to left visual field(Mild Lt inattention) Praxis Praxis: Intact Cognition Overall Cognitive Status: Impaired/Different from baseline Arousal/Alertness: Awake/alert Orientation Level: Person;Place;Situation Person: Oriented Place: Oriented Situation: Oriented Year: 2020 Month: August Day of Week: Correct Memory: Impaired Memory Impairment: Retrieval deficit Immediate Memory Recall: Sock;Blue;Bed Memory Recall Sock: Not able to recall Memory Recall Blue:  Without Cue Memory Recall Bed: Without Cue Attention: Alternating Awareness: Impaired Awareness Impairment: Emergent impairment Problem Solving: Impaired Problem Solving Impairment: Functional complex Safety/Judgment: Impaired Sensation Coordination Gross Motor Movements are Fluid and Coordinated: No Fine Motor Movements are Fluid and Coordinated: No Finger Nose Finger Test: Slightly tremulous B UEs Motor  Motor Motor - Skilled Clinical Observations: Genrealized weakness Mobility    Mod A sit<stand from lower surfaces (I.e. bed + toilet), Min A for ambulation using device  Trunk/Postural Assessment  Cervical Assessment Cervical Assessment: Exceptions to WFL(forward head) Thoracic Assessment Thoracic Assessment: Exceptions to WFL(rounded shoulders) Lumbar Assessment Lumbar Assessment: Exceptions to WFL(posterior pelvic tilt) Postural Control Postural Control: Deficits on evaluation(posterior lean in standing)  Balance Balance Balance Assessed: Yes Dynamic Sitting Balance Dynamic Sitting - Balance Support: Feet supported;No upper extremity supported(donning shirt EOB) Dynamic Sitting - Level of Assistance: 5: Stand by assistance Dynamic Standing Balance Dynamic Standing - Balance Support: No upper extremity supported;During functional activity Dynamic Standing - Level of Assistance: 4: Mod assist Dynamic Standing - Balance Activities: Forward lean/weight shifting;Lateral lean/weight shifting (LB dressing) Extremity/Trunk Assessment RUE Assessment RUE Assessment: Within Functional Limits Active Range of Motion (AROM) Comments: <90 degrees shoulder flexion + abduction General Strength Comments: 3-/5 grossly LUE Assessment LUE Assessment: Within Functional Limits Active Range of Motion (AROM) Comments: <90 degrees shoulder flexion + abduction General Strength Comments: 3-/5 grossly    Refer to Care Plan for Long Term Goals  Recommendations for other services: Therapeutic  Recreation  Pet therapy   Discharge Criteria: Patient will be discharged from OT if patient refuses treatment 3 consecutive times without medical reason, if treatment goals not met, if there is a change in medical status, if patient makes no progress towards goals or if patient is discharged from hospital.  The above assessment, treatment plan, treatment alternatives and goals were discussed and mutually agreed upon: by patient  Skeet Simmer 04/22/2019, 12:41 PM

## 2019-04-22 NOTE — Evaluation (Signed)
Speech Language Pathology Assessment and Plan  Patient Details  Name: Benjamin Hahn MRN: [id number] Date of Birth: Oct 26, 1932  SLP Diagnosis: Cognitive Impairments  Rehab Potential: Good ELOS: 10 days    Today's Date: 04/22/2019 SLP Individual Time: 0805-0900 SLP Individual Time Calculation (min): 55 min   Problem List:  Patient Active Problem List   Diagnosis Date Noted  . Traumatic subdural hematoma ([place]) 04/21/2019  . Subdural hematoma ([place]) 04/17/2019  . Confusion 04/15/2019  . Urinary frequency 04/15/2019  . Memory changes 04/15/2019  . Hyperglycemia 04/15/2019  . Shuffling gait 04/15/2019  . Weakness generalized 04/14/2019  . Mild intermittent asthma with acute exacerbation 02/08/2018  . Hypothyroidism 03/14/2013  . Hyperlipidemia LDL goal <70 03/14/2013  . BPH (benign prostatic hypertrophy) 03/14/2013  . Hypogonadism, male 03/14/2013  . Erectile dysfunction 03/14/2013   Past Medical History:  Past Medical History:  Diagnosis Date  . Anxiety   . Arthritis    might be in back, no problems  . BPH (benign prostatic hyperplasia)   . Depression 1990s  . GERD (gastroesophageal reflux disease)    occasional  . Hypercholesteremia    controled  . Hypothyroid   . Shingles May 2014   "Right face, still has some"  . Temporal arteritis ([place])   . Transient blindness of both eyes    Past Surgical History:  Past Surgical History:  Procedure Laterality Date  . ARTERY BIOPSY Right 06/20/2013   Procedure: BIOPSY TEMPORAL ARTERY RIGHT;  Surgeon: Earnstine Regal, MD;  [facility];  Service: General;  Laterality: Right;  . CARDIAC CATHETERIZATION N/A 09/02/2015   Procedure: Left Heart Cath and Coronary Angiography;  Surgeon: Charolette Forward, MD;  Location: [facility];  Service: Cardiovascular;  Laterality: N/A;  . CARDIAC CATHETERIZATION N/A 09/02/2015   Procedure: Coronary Stent Intervention;  Surgeon: Charolette Forward, MD;  Location: [facility];  Service:  Cardiovascular;  Laterality: N/A;  1.  mid RCA      (3.0/28mm Xience) 2.  Mid LAD      (3.0/23mm Xience)  . CRANIOTOMY N/A 04/19/2019   Procedure: CRANIOTOMY HEMATOMA EVACUATION SUBDURAL;  Surgeon: Jovita Gamma, MD;  Location: [facility];  Service: Neurosurgery;  Laterality: N/A;  CRANIOTOMY HEMATOMA EVACUATION SUBDURAL  . None      Assessment / Plan / Recommendation Clinical Impression   Jaise Moser. Mozer is a 85 year old right-handed male with history of BPH,hypertension,hyperlipidemia and occasional alcohol. Per chart review patient lives with spouse. Two-level home with bedroom on Main level with level entry to the home. 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 Underwent right frontoparietal temporal craniotomy and evacuation of subdural hematoma 04/19/2019 per Dr. [name]. Follow-up CT of the brain 04/21/2019 shows decrease in size of subdural improvement in midline shiftand plan to remove Jackson-Pratt drain. Hospital course urinary retention with history of BPH and bladder scan 682 cc with a 16 French coud catheter placed and plans to continue Foley tube until follow-up with urology services as patient has been followed by Dr. [name] in the past. Patient was placed on Flomax. Tolerating a regular diet. Therapy evaluations completed and patient was admitted for a comprehensive rehab program.  SLP evaluation was completed on 04/22/2019 with the following results:  Pt presents with mild higher level  cognitive deficits which pt reports to be different from his baseline level of functioning.  Deficits were most notable in delayed recall of information and higher level problem solving.  As a result, pt currently needs min assist-supervision for  cognitive tasks.  Therefore, pt would benefit from skilled ST while inpatient in order to maximize functional independence and reduce burden of care prior to discharge.  Anticipate that pt will need at least initial 24/7 supervision at discharge and possible outpatient ST follow up to facilitate return to work at [employer].    Skilled Therapeutic Interventions          Cognitive-linguistic evaluation completed with results and recommendations reviewed with patient.     SLP Assessment  Patient will need skilled [facility] during CIR admission    Recommendations  Patient destination: Home Follow up Recommendations: Outpatient SLP;24 hour supervision/assistance Equipment Recommended: None recommended by SLP    SLP Frequency 3 to 5 out of 7 days   SLP Duration  SLP Intensity  SLP Treatment/Interventions 10 days  Minumum of 1-2 x/day, 30 to 90 minutes  Cueing hierarchy;Cognitive remediation/compensation;Functional tasks;Internal/external aids;Patient/family education    Pain Pain Assessment Pain Scale: 0-10 Pain Score: 0-No pain  Prior Functioning Cognitive/Linguistic Baseline: Within functional limits Type of Home: House  Lives With: Spouse Available Help at Discharge: Family;Available 24 hours/day Education: law degree Vocation: Part time employment  Short Term Goals: Week 1: SLP Short Term Goal 1 (Week 1): STG=LTG due to ELOS  Refer to Care Plan for Long Term Goals  Recommendations for other services: None   Discharge Criteria: Patient will be discharged from SLP if patient refuses treatment 3 consecutive times without medical reason, if treatment goals not met, if there is a change in medical status, if patient makes no progress towards goals or if patient is discharged from hospital.  The above assessment, treatment plan, treatment alternatives and goals were discussed and mutually agreed upon: by patient  Emilio Math 04/22/2019, 9:16  AM

## 2019-04-22 NOTE — Progress Notes (Signed)
[facility] PHYSICAL MEDICINE & REHABILITATION PROGRESS NOTE   Subjective/Complaints: Had a reasonable night. Asked if his imipramine could be moved up a little bit  ROS: Patient denies fever, rash, sore throat, blurred vision, nausea, vomiting, diarrhea, cough, shortness of breath or chest pain, joint or back pain, headache, or mood change.    Objective:   Ct Head Wo Contrast  Result Date: 04/21/2019 CLINICAL DATA:  Subdural drainage EXAM: CT HEAD WITHOUT CONTRAST TECHNIQUE: Contiguous axial images were obtained from the base of the skull through the vertex without intravenous contrast. COMPARISON:  MRI head 04/17/2019 FINDINGS: Brain: Interval right-sided craniotomy and drainage of subdural hematoma. Subdural drain in place. Mixed density high and low-density subdural hematoma on the right is significantly smaller now measuring approximately 4.5 mm in diameter. Subdural gas on the right. Improvement in midline shift now approximately 3 mm. Negative for acute infarct Generalized atrophy with ventricular enlargement. White matter ischemic changes appear chronic. Vascular: Negative for hyperdense vessel Skull: Right-sided craniotomy.  Craniotomy flap in good position. Sinuses/Orbits: Air-fluid level right maxillary sinus. Other: None IMPRESSION: Interval right craniotomy for drainage of subdural hematoma. Decrease in size of mixed density subdural hematoma. Improvement in midline shift now 3 mm. Electronically Signed   By: Franchot Gallo M.D.   On: 04/21/2019 09:04   Recent Labs    04/19/19 1747 04/19/19 1822  HGB 10.5* 10.5*  HCT 31.0* 31.0*   Recent Labs    04/19/19 1747 04/19/19 1822  NA 139 140  K 3.8 3.8    Intake/Output Summary (Last 24 hours) at 04/22/2019 0842 Last data filed at 04/22/2019 0816 Gross per 24 hour  Intake 660 ml  Output 2450 ml  Net -1790 ml     Physical Exam: Vital Signs Blood pressure 122/64, pulse 91, temperature 98.1 F (36.7 C), temperature source  Oral, resp. rate 19, height 5' 7.5" (1.715 m), weight 84.9 kg, SpO2 95 %.  Constitutional: No distress . Vital signs reviewed. HEENT: EOMI, oral membranes moist    Neck: supple Cardiovascular: RRR without murmur. No JVD    Respiratory: CTA Bilaterally without wheezes or rales. Normal effort    GI: BS +, non-tender, non-distended  Genitourinary: Genitourinary Comments: Indwelling Foley catheter tube in place Musculoskeletal:  General: No deformityor edema.  Neurological:alert. A little hoh. Follows all simple commands. Normal language. Mild left inattention. Moves all 4 limbs. Mild left pronator drift. Motor 4/5 all 4. No focal sensory findings.  Skin: a few scattered bruises.  Psychiatric: pleasant   Assessment/Plan: 1. Functional deficits secondary to right SDH after fall which require 3+ hours per day of interdisciplinary therapy in a comprehensive inpatient rehab setting.  Physiatrist is providing close team supervision and 24 hour management of active medical problems listed below.  Physiatrist and rehab team continue to assess barriers to discharge/monitor patient progress toward functional and medical goals  Care Tool:  Bathing              Bathing assist       Upper Body Dressing/Undressing Upper body dressing   What is the patient wearing?: Hospital gown only    Upper body assist Assist Level: Minimal Assistance - Patient > 75%    Lower Body Dressing/Undressing Lower body dressing            Lower body assist       Toileting Toileting    Toileting assist Assist for toileting: Moderate Assistance - Patient 50 - 74%     Transfers Chair/bed transfer  Transfers assist           Locomotion Ambulation   Ambulation assist              Walk 10 feet activity   Assist           Walk 50 feet activity   Assist           Walk 150 feet activity   Assist           Walk 10 feet on uneven surface   activity   Assist           Wheelchair     Assist               Wheelchair 50 feet with 2 turns activity    Assist            Wheelchair 150 feet activity     Assist          Blood pressure 122/64, pulse 91, temperature 98.1 F (36.7 C), temperature source Oral, resp. rate 19, height 5' 7.5" (1.715 m), weight 84.9 kg, SpO2 95 %.  Medical Problem List and Plan: 1.Decreased functional mobility with left side weaknesssecondary to right hemispheric traumatic subacute and chronic subdural hematoma with multiple falls. Status post right frontoparietal temporal craniotomy evacuation of subdural hematoma 04/19/2019 --Patient is beginning CIR therapies today including PT, OT, and SLP 2. Antithrombotics: -DVT/anticoagulation:SCDs -antiplatelet therapy: N/A 3. Pain Management:Hydrocodone as needed 4. Mood:Tofranil 10 mg daily---change to 2000 nightly  - Wellbutrin 150 mg daily  -Effexor 37.5 mg daily  - Xanax 0.5 mg nightly as needed -antipsychotic agents: N/A 5. Neuropsych: This patientiscapable of making decisions on hisown behalf. 6. Skin/Wound Care:Routine skin checks  -continue scalp staples 7. Fluids/Electrolytes/Nutrition:Routine in and outs with follow-up chemistries 8. Hypertension. Toprol 12.5 mg daily, Altace 1.25 mg daily.   -controlled 8/29 9. BPH. Noted urinary retention. 16 French coud catheter placed. Patient followed by urology services Dr Gloriann Loan. -Plan is currently to keep Foley tube until follow up office. -Flomax 0.8 mg daily. 10. Hypothyroidism. Synthroid    LOS: 1 days A FACE TO [place] 04/22/2019, 8:42 AM

## 2019-04-22 NOTE — Plan of Care (Signed)
  Problem: Consults Goal: RH BRAIN INJURY PATIENT EDUCATION Description: Description: See Patient Education module for eduction specifics Outcome: Progressing Goal: Skin Care Protocol Initiated - if Braden Score 18 or less Description: If consults are not indicated, leave blank or document N/A Outcome: Progressing   Problem: RH BLADDER ELIMINATION Goal: RH STG MANAGE BLADDER WITH ASSISTANCE Description: STG Manage Bladder With max Assistance Outcome: Progressing Goal: RH STG MANAGE BLADDER WITH EQUIPMENT WITH ASSISTANCE Description: STG Manage Bladder With Equipment With total Assistance from caregiver Outcome: Progressing   Problem: RH SKIN INTEGRITY Goal: RH STG ABLE TO PERFORM INCISION/WOUND CARE W/ASSISTANCE Description: STG Able To Perform Incision/Wound Care With total Assistance from caregiver. Outcome: Progressing   Problem: RH SAFETY Goal: RH STG ADHERE TO SAFETY PRECAUTIONS W/ASSISTANCE/DEVICE Description: STG Adhere to Safety Precautions With supervision Assistance/Device. Outcome: Progressing   Problem: RH COGNITION-NURSING Goal: RH STG ANTICIPATES NEEDS/CALLS FOR ASSIST W/ASSIST/CUES Description: STG Anticipates Needs/Calls for Assist With mod I Assistance/Cues. Outcome: Progressing   Problem: RH PAIN MANAGEMENT Goal: RH STG PAIN MANAGED AT OR BELOW PT'S PAIN GOAL Description: < 3 Outcome: Progressing   Problem: RH KNOWLEDGE DEFICIT BRAIN INJURY Goal: RH STG INCREASE KNOWLEDGE OF SELF CARE AFTER BRAIN INJURY Description: Patient/caregiver will verbalize/demonstrate understanding of brain injury recovery as directed by care team with mod I assist. Outcome: Progressing

## 2019-04-22 NOTE — Evaluation (Signed)
Physical Therapy Assessment and Plan  Patient Details  Name: Benjamin Hahn MRN: [id number] Date of Birth: 01/03/1933  PT Diagnosis: Abnormal posture, Abnormality of gait, Coordination disorder and Muscle weakness Rehab Potential: Good ELOS: 10-14 days   Today's Date: 04/22/2019 PT Individual Time: 1300-1400 PT Individual Time Calculation (min): 60 min    Problem List:  Patient Active Problem List   Diagnosis Date Noted  . Traumatic subdural hematoma ([place]) 04/21/2019  . Subdural hematoma ([place]) 04/17/2019  . Confusion 04/15/2019  . Urinary frequency 04/15/2019  . Memory changes 04/15/2019  . Hyperglycemia 04/15/2019  . Shuffling gait 04/15/2019  . Weakness generalized 04/14/2019  . Mild intermittent asthma with acute exacerbation 02/08/2018  . Hypothyroidism 03/14/2013  . Hyperlipidemia LDL goal <70 03/14/2013  . BPH (benign prostatic hypertrophy) 03/14/2013  . Hypogonadism, male 03/14/2013  . Erectile dysfunction 03/14/2013    Past Medical History:  Past Medical History:  Diagnosis Date  . Anxiety   . Arthritis    might be in back, no problems  . BPH (benign prostatic hyperplasia)   . Depression 1990s  . GERD (gastroesophageal reflux disease)    occasional  . Hypercholesteremia    controled  . Hypothyroid   . Shingles May 2014   "Right face, still has some"  . Temporal arteritis ([place])   . Transient blindness of both eyes    Past Surgical History:  Past Surgical History:  Procedure Laterality Date  . ARTERY BIOPSY Right 06/20/2013   Procedure: BIOPSY TEMPORAL ARTERY RIGHT;  Surgeon: Benjamin Hahn;  Location: [facility];  Service: General;  Laterality: Right;  . CARDIAC CATHETERIZATION N/A 09/02/2015   Procedure: Left Heart Cath and Coronary Angiography;  Surgeon: Benjamin Hahn;  Location: [place] [facility];  Service: Cardiovascular;  Laterality: N/A;  . CARDIAC CATHETERIZATION N/A 09/02/2015   Procedure: Coronary Stent Intervention;  Surgeon: Benjamin Hahn;  Location: [place] [facility];  Service: Cardiovascular;  Laterality: N/A;  1.  mid RCA      (3.0/28mm Xience) 2.  Mid LAD      (3.0/23mm Xience)  . CRANIOTOMY N/A 04/19/2019   Procedure: CRANIOTOMY HEMATOMA EVACUATION SUBDURAL;  Surgeon: Benjamin Hahn;  Location: [facility];  Service: Neurosurgery;  Laterality: N/A;  CRANIOTOMY HEMATOMA EVACUATION SUBDURAL  . None      Assessment & Plan Clinical Impression: Patient is a 85 year old right-handed male with history of BPH,hypertension,hyperlipidemia and occasional alcohol. Per chart review patient lives with spouse. Two-level home with bedroom on Main level with level entry to the home. 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 Underwent right frontoparietal temporal craniotomy and evacuation of subdural hematoma 04/19/2019 per Benjamin Hahn. Follow-up CT of the brain 04/21/2019 shows decrease in size of subdural improvement in midline shiftand plan to remove Jackson-Pratt drain. Hospital course urinary retention with history of BPH and bladder scan 682 cc with a 16 French coud catheter placed and plans to continue Foley tube until follow-up with urology services as patient has been followed by Benjamin Hahn in the past.  Patient transferred to [facility] on 04/21/2019 .   Patient currently requires mod with mobility secondary to muscle weakness and muscle joint tightness, decreased cardiorespiratoy endurance, motor apraxia and decreased coordination, decreased attention to left, decreased attention,  decreased awareness, decreased problem solving, decreased safety awareness, decreased memory and delayed processing and decreased sitting balance, decreased standing balance, decreased postural  control, hemiplegia and decreased balance strategies.  Prior to hospitalization, patient was modified independent  with mobility and lived with Spouse in a House home.  Home access is  Level entry.  Patient will benefit from skilled PT intervention to maximize safe functional mobility, minimize fall risk and decrease caregiver burden for planned discharge home with 24 hour supervision.  Anticipate patient will benefit from follow up [place] at discharge.  PT - End of Session Activity Tolerance: Tolerates < 10 min activity with changes in vital signs Endurance Deficit: Yes PT Assessment Rehab Potential (ACUTE/IP ONLY): Good PT Barriers to Discharge: Inaccessible home environment;Decreased caregiver support;Medical stability;Home environment access/layout PT Patient demonstrates impairments in the following area(s): Balance;Behavior;Edema;Endurance;Motor;Perception;Safety;Sensory;Skin Integrity PT Transfers Functional Problem(s): Bed Mobility;Bed to Chair;Furniture;Car;Floor PT Locomotion Functional Problem(s): Ambulation;Wheelchair Mobility;Stairs PT Plan PT Intensity: Minimum of 1-2 x/day ,45 to 90 minutes PT Frequency: 5 out of 7 days PT Duration Estimated Length of Stay: 10-14 days PT Treatment/Interventions: Ambulation/gait training;Discharge planning;Functional mobility training;Psychosocial support;Therapeutic Activities;Visual/perceptual remediation/compensation;Wheelchair propulsion/positioning;Therapeutic Exercise;Skin care/wound management;Neuromuscular re-education;Disease management/prevention;Cognitive remediation/compensation;DME/adaptive equipment instruction;Balance/vestibular training;Pain management;Splinting/orthotics;UE/LE Strength taining/ROM;Stair training;UE/LE Coordination activities;Patient/family education;Community reintegration PT Transfers Anticipated Outcome(s): [place] assist with LRAD PT Locomotion Anticipated Outcome(s): [place] assist ambulatory with LRAD PT  Recommendation Recommendations for Other Services: Therapeutic Recreation consult Therapeutic Recreation Interventions: Outing/community reintergration;Stress management Follow Up Recommendations: Home health PT Patient destination: Home Equipment Recommended: To be determined  Skilled Therapeutic Intervention  Pt received supine in bed and agreeable to PT. Supine>sit transfer with min assist and cues for safety. PT instructed patient in PT Evaluation and initiated treatment intervention; see below for results. PT educated patient in [place], rehab potential, rehab goals, and discharge recommendations. Orthostatic vital signs. Sitting: 93/56. Standing 0 min 76/46. Standing 2 min 91/52. Gait training with RW as listed below. At 67f, pt with decreased coordination and verbalization. Returned to sitting position and returned to baseline. BP assessed in sitting following gait 109/52. PT applied thigh high teds. Due to increased dizziness when standing, pt unable to perform standing or car transfer. [facility] to [facility] and [facility] and all needs met.       PT Evaluation Precautions/Restrictions Precautions Precautions: Fall Precaution Comments: Rt crani General   Vital SignsTherapy Vitals Pulse Rate: 80 BP: (!) 93/56 Patient Position (if appropriate): Sitting Pain   deneis Home Living/Prior Functioning Home Living Available Help at Discharge: Family;Available 24 hours/day Type of Home: House Home Access: Level entry Home Layout: Two level;Able to live on main level with bedroom/bathroom Bathroom Shower/Tub: WMultimedia programmer Handicapped height Bathroom Accessibility: Yes  Lives With: Spouse Prior Function Level of Independence: Requires assistive device for independence(rollator) Driving: Yes Vocation: Part time employment Vocation Requirements: part time lawyer Vision/Perception  Perception Perception:  Impaired Inattention/Neglect: Does not attend to left visual field Praxis Praxis: Intact  Cognition Overall Cognitive Status: Impaired/Different from baseline Arousal/Alertness: Awake/alert Orientation Level: Oriented X4 Attention: Alternating Alternating Attention: Appears intact Memory: Impaired Memory Impairment: Retrieval deficit Immediate Memory Recall: Sock;Blue;Bed Memory Recall Sock: Not able to recall Memory Recall Blue: Without Cue Memory Recall Bed: Without Cue Awareness: Appears intact Awareness Impairment: Emergent impairment Problem Solving: Impaired Problem Solving Impairment: Functional complex Executive Function: Decision Making;Initiating;Reasoning Reasoning: Impaired Decision Making: Impaired Initiating: Impaired Safety/Judgment: Impaired Sensation Sensation Light Touch: Appears Intact Proprioception: Impaired by gross assessment Coordination Gross Motor Movements  are Fluid and Coordinated: No Fine Motor Movements are Fluid and Coordinated: No Coordination and Movement Description: festination pattern with gait and WC propulsion Finger Nose Finger Test: Slightly tremulous B UEs Motor  Motor Motor: Abnormal postural alignment and control;Motor perseverations Motor - Skilled Clinical Observations: Genrealized weakness, festination  Mobility Bed Mobility Bed Mobility: Rolling Right;Rolling Left;Supine to Sit;Sit to Supine Rolling Right: Minimal Assistance - Patient > 75% Rolling Left: Minimal Assistance - Patient > 75% Supine to Sit: Minimal Assistance - Patient > 75% Sit to Supine: Minimal Assistance - Patient > 75% Transfers Transfers: Sit to [employer] Transfers Sit to Stand: Moderate Assistance - Patient 50-74% Stand Pivot Transfers: Moderate Assistance - Patient 50 - 74% Stand Pivot Transfer Details (indicate cue type and reason): no AD Locomotion  Gait Ambulation: Yes Gait Assistance: Minimal Assistance - Patient > 75% Gait Distance  (Feet): 75 Feet Assistive device: Rolling walker Gait Assistance Details: decreased sequencing and safety after 58f requiring + 2 to place WC and sit. Gait Gait: Yes Gait Pattern: Festinating;Decreased stride length Stairs / Additional Locomotion Stairs: No Wheelchair Mobility Wheelchair Mobility: Yes Wheelchair Assistance: Minimal assistance - Patient >75% Wheelchair Propulsion: Both upper extremities Wheelchair Parts Management: Needs assistance Distance: 75, festinating pattern  Trunk/Postural Assessment  Cervical Assessment Cervical Assessment: Exceptions to WFL(forward head) Thoracic Assessment Thoracic Assessment: Exceptions to WFL(rounded shoulders) Lumbar Assessment Lumbar Assessment: Exceptions to WFL(posterior pelvic tilt) Postural Control Postural Control: Deficits on evaluation(posterior lean in standing)  Balance Balance Balance Assessed: Yes Dynamic Sitting Balance Dynamic Sitting - Balance Support: No upper extremity supported Dynamic Sitting - Level of Assistance: 5: Stand by assistance Dynamic Standing Balance Dynamic Standing - Balance Support: No upper extremity supported;During functional activity Dynamic Standing - Level of Assistance: 3: Mod assist Dynamic Standing - Balance Activities: Forward lean/weight shifting;Lateral lean/weight shifting Dynamic Standing - Comments: LB dressing Extremity Assessment  RUE Assessment RUE Assessment: Within Functional Limits Active Range of Motion (AROM) Comments: <90 degrees shoulder flexion + abduction General Strength Comments: 3-/5 grossly LUE Assessment LUE Assessment: Within Functional Limits Active Range of Motion (AROM) Comments: <90 degrees shoulder flexion + abduction General Strength Comments: 3-/5 grossly RLE Assessment RLE Assessment: Within Functional Limits LLE Assessment LLE Assessment: Within Functional Limits    Refer to Care Plan for Long Term Goals  Recommendations for other services:  Therapeutic Recreation  Stress management and Outing/community reintegration  Discharge Criteria: Patient will be discharged from PT if patient refuses treatment 3 consecutive times without medical reason, if treatment goals not met, if there is a change in medical status, if patient makes no progress towards goals or if patient is discharged from hospital.  The above assessment, treatment plan, treatment alternatives and goals were discussed and mutually agreed upon: by patient  ALorie Phenix8/29/2020, 2:07 PM

## 2019-04-23 ENCOUNTER — Inpatient Hospital Stay (HOSPITAL_COMMUNITY): Payer: Medicare Other | Admitting: Physical Therapy

## 2019-04-23 NOTE — Progress Notes (Signed)
Physical Therapy Session Note  Patient Details  Name: Benjamin Hahn MRN: [id number] Date of Birth: 06/22/1933  Today's Date: 04/23/2019 PT Individual Time: 1004-1049 PT Individual Time Calculation (min): 45 min   Short Term Goals: Week 1:  PT Short Term Goal 1 (Week 1): Pt will perform bed mobility with supervision assist. PT Short Term Goal 2 (Week 1): Pt will consistently perform stand pivot transfer with min assist and LRAD PT Short Term Goal 3 (Week 1): Pt will ambulate 139ft with min assist and LRAD  Skilled Therapeutic Interventions/Progress Updates:  Pt was seen bedside in the am. Pt performed multiple sit to stand and stand pivot transfers with rolling walker and min A with verbal cues. Pt ambulated 131 feet with rolling walker and min A. Pt performed car transfers with min to mod A and verbal cues. Pt returned to room following treatment. Pt transferred to recliner with min a and rolling walker. Pt left sitting up in recliner with chair alarm in place and call bell within reach.   Therapy Documentation Precautions:  Precautions Precautions: Fall Precaution Comments: Rt crani Restrictions Weight Bearing Restrictions: No General:   Pain: No c/o pain.   Therapy/Group: Individual Therapy  [name] 04/23/2019, 12:37 PM

## 2019-04-23 NOTE — Progress Notes (Signed)
[facility] PHYSICAL MEDICINE & REHABILITATION PROGRESS NOTE   Subjective/Complaints: No problems overnight. Had concerns about his low DBP yesterday. Denies headache or pain today  ROS: Patient denies fever, rash, sore throat, blurred vision, nausea, vomiting, diarrhea, cough, shortness of breath or chest pain, joint or back pain,   or mood change.   Objective:   No results found. No results for input(s): WBC, HGB, HCT, PLT in the last 72 hours. No results for input(s): NA, K, CL, CO2, GLUCOSE, BUN, CREATININE, CALCIUM in the last 72 hours.  Intake/Output Summary (Last 24 hours) at 04/23/2019 0951 Last data filed at 04/23/2019 0847 Gross per 24 hour  Intake 700 ml  Output 1875 ml  Net -1175 ml     Physical Exam: Vital Signs Blood pressure 115/66, pulse 75, temperature 98.1 F (36.7 C), temperature source Oral, resp. rate 18, height 5' 7.5" (1.715 m), weight 82.8 kg, SpO2 95 %.  Constitutional: No distress . Vital signs reviewed. HEENT: EOMI, oral membranes moist  Wound is c/w pic today 8/30  Neck: supple Cardiovascular: RRR without murmur. No JVD    Respiratory: CTA Bilaterally without wheezes or rales. Normal effort    GI: BS +, non-tender, non-distended  Genitourinary: Genitourinary Comments: Indwelling Foley catheter tube in place Musculoskeletal:  General: No deformityor edema.  Neurological: alert. Normal language. Follows commands. Fair insight. Mild left inattention. Moves all 4 limbs. Mild left pronator drift. Motor 4/5 all 4. No focal sensory findings.  Skin: a few scattered bruises.  Psychiatric: pleasant   Assessment/Plan: 1. Functional deficits secondary to right SDH after fall which require 3+ hours per day of interdisciplinary therapy in a comprehensive inpatient rehab setting.  Physiatrist is providing close team supervision and 24 hour management of active medical problems listed below.  Physiatrist and rehab team continue to assess  barriers to discharge/monitor patient progress toward functional and medical goals  Care Tool:  Bathing    Body parts bathed by patient: Right arm, Left arm, Chest, Abdomen, Buttocks, Front perineal area, Right upper leg, Left upper leg, Right lower leg, Left lower leg, Face         Bathing assist Assist Level: Minimal Assistance - Patient > 75%     Upper Body Dressing/Undressing Upper body dressing   What is the patient wearing?: Button up shirt    Upper body assist Assist Level: Minimal Assistance - Patient > 75%    Lower Body Dressing/Undressing Lower body dressing      What is the patient wearing?: Underwear/pull up, Pants     Lower body assist Assist for lower body dressing: Minimal Assistance - Patient > 75%     Toileting Toileting    Toileting assist Assist for toileting: Minimal Assistance - Patient > 75%     Transfers Chair/bed transfer  Transfers assist     Chair/bed transfer assist level: Minimal Assistance - Patient > 75%     Locomotion Ambulation   Ambulation assist      Assist level: Minimal Assistance - Patient > 75% Assistive device: Walker-rolling Max distance: 70   Walk 10 feet activity   Assist     Assist level: Minimal Assistance - Patient > 75% Assistive device: Walker-rolling   Walk 50 feet activity   Assist    Assist level: Minimal Assistance - Patient > 75%      Walk 150 feet activity   Assist Walk 150 feet activity did not occur: Safety/medical concerns         Walk 10  feet on uneven surface  activity   Assist Walk 10 feet on uneven surfaces activity did not occur: Safety/medical concerns         Wheelchair     Assist Will patient use wheelchair at discharge?: Yes Type of Wheelchair: Manual    Wheelchair assist level: Minimal Assistance - Patient > 75% Max wheelchair distance: 150    Wheelchair 50 feet with 2 turns activity    Assist        Assist Level: Minimal Assistance -  Patient > 75%   Wheelchair 150 feet activity     Assist  Wheelchair 150 feet activity did not occur: Safety/medical concerns   Assist Level: Minimal Assistance - Patient > 75%   Blood pressure 115/66, pulse 75, temperature 98.1 F (36.7 C), temperature source Oral, resp. rate 18, height 5' 7.5" (1.715 m), weight 82.8 kg, SpO2 95 %.  Medical Problem List and Plan: 1.Decreased functional mobility with left side weaknesssecondary to right hemispheric traumatic subacute and chronic subdural hematoma with multiple falls. Status post right frontoparietal temporal craniotomy evacuation of subdural hematoma 04/19/2019 ---Continue CIR therapies including PT, OT, and SLP   2. Antithrombotics: -DVT/anticoagulation:SCDs -antiplatelet therapy: N/A 3. Pain Management:Hydrocodone as needed 4. Mood:Tofranil 10 mg daily---changed to 2000 nightly per pt request  - Wellbutrin 150 mg daily  -Effexor 37.5 mg daily  - Xanax 0.5 mg nightly as needed -antipsychotic agents: N/A 5. Neuropsych: This patientiscapable of making decisions on hisown behalf. 6. Skin/Wound Care:Routine skin checks  -continue scalp staples 7. Fluids/Electrolytes/Nutrition:Routine in and outs with follow-up chemistries 8. Hypertension. Toprol 12.5 mg daily, Altace 1.25 mg daily.   -controlled 8/30  -reassured pt that we are watching bp's  -encouraged him to eat and drink plenty of fluids 9. BPH. Noted urinary retention. 16 French coud catheter placed. Patient followed by urology services Dr Gloriann Loan. -Plan is currently to keep Foley tube until follow up office. -Flomax 0.8 mg daily. 10. Hypothyroidism. Synthroid    LOS: 2 days A FACE TO [place] 04/23/2019, 9:51 AM

## 2019-04-23 NOTE — Progress Notes (Signed)
New anchor applied to foley. Patient reports some relief with pain medication given. Urine still blood tinged-no clots/sediments noted. Reported off to night RN.

## 2019-04-23 NOTE — Plan of Care (Signed)
  Problem: Consults Goal: RH BRAIN INJURY PATIENT EDUCATION Description: Description: See Patient Education module for eduction specifics Outcome: Progressing Goal: Skin Care Protocol Initiated - if Braden Score 18 or less Description: If consults are not indicated, leave blank or document N/A Outcome: Progressing   Problem: RH BLADDER ELIMINATION Goal: RH STG MANAGE BLADDER WITH ASSISTANCE Description: STG Manage Bladder With max Assistance Outcome: Progressing Goal: RH STG MANAGE BLADDER WITH EQUIPMENT WITH ASSISTANCE Description: STG Manage Bladder With Equipment With total Assistance from caregiver Outcome: Progressing   Problem: RH SKIN INTEGRITY Goal: RH STG ABLE TO PERFORM INCISION/WOUND CARE W/ASSISTANCE Description: STG Able To Perform Incision/Wound Care With total Assistance from caregiver. Outcome: Progressing   Problem: RH SAFETY Goal: RH STG ADHERE TO SAFETY PRECAUTIONS W/ASSISTANCE/DEVICE Description: STG Adhere to Safety Precautions With supervision Assistance/Device. Outcome: Progressing   Problem: RH COGNITION-NURSING Goal: RH STG ANTICIPATES NEEDS/CALLS FOR ASSIST W/ASSIST/CUES Description: STG Anticipates Needs/Calls for Assist With mod I Assistance/Cues. Outcome: Progressing   Problem: RH PAIN MANAGEMENT Goal: RH STG PAIN MANAGED AT OR BELOW PT'S PAIN GOAL Description: < 3 Outcome: Progressing   Problem: RH KNOWLEDGE DEFICIT BRAIN INJURY Goal: RH STG INCREASE KNOWLEDGE OF SELF CARE AFTER BRAIN INJURY Description: Patient/caregiver will verbalize/demonstrate understanding of brain injury recovery as directed by care team with mod I assist. Outcome: Progressing

## 2019-04-23 NOTE — Progress Notes (Signed)
Small amount of blood noted around cath/penis tip.Foley care and peri care completed. Catheter appeared to be tugging. Anchor removed and awaiting another. 200cc emptied slightly red/pink.

## 2019-04-23 NOTE — Progress Notes (Signed)
Physical Therapy Session Note  Patient Details  Name: Benjamin Hahn MRN: [id number] Date of Birth: 1932-12-08  Today's Date: 04/23/2019 PT Individual Time: 0803-0900 PT Individual Time Calculation (min): 57 min   Short Term Goals: Week 1:  PT Short Term Goal 1 (Week 1): Pt will perform bed mobility with supervision assist. PT Short Term Goal 2 (Week 1): Pt will consistently perform stand pivot transfer with min assist and LRAD PT Short Term Goal 3 (Week 1): Pt will ambulate 166ft with min assist and LRAD  Skilled Therapeutic Interventions/Progress Updates:  Pt was seen bedside in the am. Pt transferred supine to edge of bed with min A and verbal cues. At edge of bed pt donned pants and shirts. Pt stood with rolling walker and min A with verbal cues. Pt's standing balance was c/g to min A. Pt transferred from edge of bed to w/c with min A with rolling walker and verbal cues. Pt propelled w/c about 150 feet with B LEs and min A with verbal cues. In gym treatment focused on NMR, utilizing step taps 3 sets x 10 reps each. BP in gym with thigh TED hose was 105/60 and HR 93. Pt performed multiple sit to stand and stand pivot with c/g to min A and rolling walker with verbal cues. Pt ascended/descended 4 stairs with B rails and min to mod A with verbal cues.  Pt propelled w/c back to room about 150 feet with B LEs and min A with verbal cues. Pt left with nurse tech at end of treatment.   Therapy Documentation Precautions:  Precautions Precautions: Fall Precaution Comments: Rt crani Restrictions Weight Bearing Restrictions: No General:   Pain: No c/o pain.    Therapy/Group: Individual Therapy  [name] 04/23/2019, 10:55 AM

## 2019-04-24 ENCOUNTER — Inpatient Hospital Stay (HOSPITAL_COMMUNITY): Payer: Medicare Other

## 2019-04-24 ENCOUNTER — Inpatient Hospital Stay (HOSPITAL_COMMUNITY): Payer: Medicare Other | Admitting: Occupational Therapy

## 2019-04-24 ENCOUNTER — Inpatient Hospital Stay (HOSPITAL_COMMUNITY): Payer: Medicare Other | Admitting: Speech Pathology

## 2019-04-24 LAB — COMPREHENSIVE METABOLIC PANEL
ALT: 26 U/L (ref 0–44)
AST: 25 U/L (ref 15–41)
Albumin: 2.7 g/dL — ABNORMAL LOW (ref 3.5–5.0)
Alkaline Phosphatase: 70 U/L (ref 38–126)
Anion gap: 6 (ref 5–15)
BUN: 21 mg/dL (ref 8–23)
CO2: 30 mmol/L (ref 22–32)
Calcium: 8.7 mg/dL — ABNORMAL LOW (ref 8.9–10.3)
Chloride: 102 mmol/L (ref 98–111)
Creatinine, Ser: 1.39 mg/dL — ABNORMAL HIGH (ref 0.61–1.24)
GFR calc Af Amer: 54 mL/min — ABNORMAL LOW (ref >60)
GFR calc non Af Amer: 46 mL/min — ABNORMAL LOW (ref >60)
Glucose, Bld: 97 mg/dL (ref 70–99)
Potassium: 4.3 mmol/L (ref 3.5–5.1)
Sodium: 138 mmol/L (ref 135–145)
Total Bilirubin: 0.6 mg/dL (ref 0.3–1.2)
Total Protein: 5.2 g/dL — ABNORMAL LOW (ref 6.5–8.1)

## 2019-04-24 LAB — CBC WITH DIFFERENTIAL/PLATELET
Abs Immature Granulocytes: 0.06 10*3/uL (ref 0.00–0.07)
Basophils Absolute: 0 10*3/uL (ref 0.0–0.1)
Basophils Relative: 1 %
Eosinophils Absolute: 0.2 10*3/uL (ref 0.0–0.5)
Eosinophils Relative: 3 %
HCT: 30.3 % — ABNORMAL LOW (ref 39.0–52.0)
Hemoglobin: 9.8 g/dL — ABNORMAL LOW (ref 13.0–17.0)
Immature Granulocytes: 1 %
Lymphocytes Relative: 28 %
Lymphs Abs: 1.9 10*3/uL (ref 0.7–4.0)
MCH: 31.4 pg (ref 26.0–34.0)
MCHC: 32.3 g/dL (ref 30.0–36.0)
MCV: 97.1 fL (ref 80.0–100.0)
Monocytes Absolute: 0.6 10*3/uL (ref 0.1–1.0)
Monocytes Relative: 9 %
Neutro Abs: 3.9 10*3/uL (ref 1.7–7.7)
Neutrophils Relative %: 58 %
Platelets: 202 10*3/uL (ref 150–400)
RBC: 3.12 MIL/uL — ABNORMAL LOW (ref 4.22–5.81)
RDW: 12.7 % (ref 11.5–15.5)
WBC: 6.6 10*3/uL (ref 4.0–10.5)
nRBC: 0 % (ref 0.0–0.2)

## 2019-04-24 NOTE — Progress Notes (Signed)
Occupational Therapy Session Note  Patient Details  Name: Benjamin Hahn MRN: [id number] Date of Birth: 05-23-1933  Today's Date: 04/24/2019 OT Individual Time: 1300-1415 OT Individual Time Calculation (min): 75 min    Short Term Goals: Week 1:  OT Short Term Goal 1 (Week 1): Pt will complete toilet transfer with Min A sit<stand using LRAD OT Short Term Goal 2 (Week 1): Pt will complete LB dressing with supervision for dynamic standing balance OT Short Term Goal 3 (Week 1): Pt will complete shower transfer with Min A and LRAD  Skilled Therapeutic Interventions/Progress Updates:    Pt greeted sitting in recliner finishing lunch with spouse present and agreeable to OT treatment session. OT got pt a wc cushion, then pt completed stand-pivot to wc with mod A and increased time +verbal and tactile cues for motor planning. Pt declined LB bathing/dressing 2/2 already putting clean pants on today, but wanted to wash UB and change shirts. Worked on L attention and functional use of L UE within bathing tasks. Pt easily distracted by spouse in the room and perseverative on conversation at times. Worked on anterior weight shift with sit<>stands at the sink with mod A. Pt ambulated in hallway with mod A, RW and wc follow from spouse. Max cues to advance R LE with difficulty motor planning during ambulation.  Pt brought to therapy gym and worked on problem solving skills and fine motor coordination with pipe tree puzzle. Pt had difficulty initiating puzzle with more distractions in the gym, but once pt's environment was quiet, he was able to complete puzzle with min cues. Pt returned to room and completed stand-pivot back to recliner with mod A and facilitation for anterior weight shift. Pt's spouse with questions regarding brain injury recovery, which OT answered and explained brian injury's affect on everyday tasks. Pt left with alarm belt on and needs met.  Therapy Documentation Precautions:   Precautions Precautions: Fall Precaution Comments: Rt crani Restrictions Weight Bearing Restrictions: No Pain: Pain Assessment Pain Scale: 0-10 Pain Score: 7  Pain Type: Acute pain;Surgical pain Pain Location: Head Pain Orientation: Anterior;Left Pain Descriptors / Indicators: Aching Pain Frequency: Intermittent Pain Onset: On-going Patients Stated Pain Goal: 4 Pain Intervention(s): Repositioned  Therapy/Group: Individual Therapy  Valma Cava 04/24/2019, 2:03 PM

## 2019-04-24 NOTE — Progress Notes (Signed)
Inpatient Rehabilitation  Patient information reviewed and entered into eRehab system by Ximena Todaro M. Marjorie Lussier, M.A., CCC/SLP, PPS Coordinator.  Information including medical coding, functional ability and quality indicators will be reviewed and updated through discharge.    

## 2019-04-24 NOTE — Progress Notes (Signed)
Speech Language Pathology Daily Session Note  Patient Details  Name: Benjamin Hahn MRN: [id number] Date of Birth: [birth year]  Today's Date: 04/24/2019 SLP Individual Time: 0900-1000 SLP Individual Time Calculation (min): 60 min  Short Term Goals: Week 1: SLP Short Term Goal 1 (Week 1): STG=LTG due to ELOS  Skilled Therapeutic Interventions: Skilled treatment session focused on cognitive goals. SLP facilitated session by providing supervision level verbal cues for recall of his current medications and their functions. Patient verbalized his "system" for medication administration at home and declined to utilize a pill box despite education on need for increased safety with new memory deficits. SLP also facilitated session by providing total A for anticipatory awareness in regards to his current deficits and how they will impact his function at discharge, especially in regards to him going back to work. Patient requested assistance sending a text message and required Max-Total A verbal and visual cues for problem solving with his cell phone that is familiar to him. Patient left upright in bed with alarm on and all needs within reach. Continue with current plan of care.      Pain Pain Assessment Pain Scale: 0-10 Pain Score: 7  Pain Type: Acute pain;Surgical pain Pain Location: Head Pain Orientation: Anterior;Left Pain Descriptors / Indicators: Aching Pain Frequency: Intermittent Pain Onset: On-going Patients Stated Pain Goal: 4 Pain Intervention(s): Medication (See eMAR)  Therapy/Group: Individual Therapy  Chela Sutphen 04/24/2019, 12:26 PM

## 2019-04-24 NOTE — Progress Notes (Signed)
Social Work Assessment and Plan   Patient Details  Name: Benjamin Hahn MRN: [id number] Date of Birth: 03/10/1933  Today's Date: 04/24/2019  Problem List:  Patient Active Problem List   Diagnosis Date Noted  . Traumatic subdural hematoma ([place]) 04/21/2019  . Subdural hematoma ([place]) 04/17/2019  . Confusion 04/15/2019  . Urinary frequency 04/15/2019  . Memory changes 04/15/2019  . Hyperglycemia 04/15/2019  . Shuffling gait 04/15/2019  . Weakness generalized 04/14/2019  . Mild intermittent asthma with acute exacerbation 02/08/2018  . Hypothyroidism 03/14/2013  . Hyperlipidemia LDL goal <70 03/14/2013  . BPH (benign prostatic hypertrophy) 03/14/2013  . Hypogonadism, male 03/14/2013  . Erectile dysfunction 03/14/2013   Past Medical History:  Past Medical History:  Diagnosis Date  . Anxiety   . Arthritis    might be in back, no problems  . BPH (benign prostatic hyperplasia)   . Depression 1990s  . GERD (gastroesophageal reflux disease)    occasional  . Hypercholesteremia    controled  . Hypothyroid   . Shingles May 2014   "Right face, still has some"  . Temporal arteritis ([place])   . Transient blindness of both eyes    Past Surgical History:  Past Surgical History:  Procedure Laterality Date  . ARTERY BIOPSY Right 06/20/2013   Procedure: BIOPSY TEMPORAL ARTERY RIGHT;  Surgeon: Earnstine Regal, MD;  Location: [facility];  Service: General;  Laterality: Right;  . CARDIAC CATHETERIZATION N/A 09/02/2015   Procedure: Left Heart Cath and Coronary Angiography;  Surgeon: Charolette Forward, MD;  Location: [facility];  Service: Cardiovascular;  Laterality: N/A;  . CARDIAC CATHETERIZATION N/A 09/02/2015   Procedure: Coronary Stent Intervention;  Surgeon: Charolette Forward, MD;  Location: [facility];  Service: Cardiovascular;  Laterality: N/A;  1.  mid RCA      (3.0/28mm Xience) 2.  Mid LAD      (3.0/23mm Xience)  . CRANIOTOMY N/A 04/19/2019   Procedure: CRANIOTOMY HEMATOMA  EVACUATION SUBDURAL;  Surgeon: Jovita Gamma, MD;  Location: [facility];  Service: Neurosurgery;  Laterality: N/A;  CRANIOTOMY HEMATOMA EVACUATION SUBDURAL  . None     Social History:  reports that he quit smoking about 52 years ago. His smoking use included cigarettes. He has never used smokeless tobacco. He reports current alcohol use of about 2.0 standard drinks of alcohol per week. He reports that he does not use drugs.  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 Caregiver Availability: 24/7 Family Dynamics: Wife visiting with pt while I am completing interview and very supportive and encouraging to him.  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 Legal History/Current Legal Issues: None Guardian/Conservator: None - per MD, pt is capabel of making decisions on his own behalf.   Abuse/Neglect Abuse/Neglect Assessment Can Be Completed: Yes Physical Abuse: Denies Verbal Abuse: Denies Sexual Abuse: Denies Exploitation of patient/patient's resources: Denies Self-Neglect: Denies  Emotional Status Pt's affect, behavior and adjustment status:  Pt sitting up in w/c and able to complete assessment interview without much difficulty.  He does c/o "having trouble gathering and organizing my thoughts.  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  Makes sure he is remembering my name and the information I am providing to him.  He admits frustration with his limitations, however, denies any significant emotional distress.  May benefit from neuropsychology consult for additional support and education while here. Recent Psychosocial Issues: None Psychiatric History: Per chart, pt with h/o depression and taking meds for this. Substance Abuse History: NA  Patient / Family Perceptions, Expectations & Goals Pt/Family understanding of illness & functional limitations: Pt and wife with good understanding of his SDH, surgery performed and current functional limitations/ need for CIR. 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 Anticipated changes in roles/activities/participation: Wife to provide primary caregiver support.  Pt may have more of a struggle and delay with return to his work than he anticipates. Pt/family expectations/goals: Per wife, "We both want to see him get back to where he was and get back to his office."  [employer]: None Premorbid Home Care/DME Agencies: None Transportation available at discharge: yes Resource referrals recommended: Neuropsychology  Discharge Planning Living Arrangements: Spouse/significant other Support Systems: Spouse/significant other, Friends/neighbors Type of Residence: Private residence Insurance underwriter Resources: [employer], Multimedia programmer (specify)(BCBS) Museum/gallery curator Resources: Employment, Radio broadcast assistant Screen Referred: No Living Expenses: Own Money Management: Patient, Spouse Does the patient have any problems obtaining your medications?: No Home Management: Pt and spouse Patient/Family Preliminary Plans: Pt to return home with wife who can provide 24/7 supervision/ light assistance. Social Work Anticipated Follow Up Needs: HH/OP Expected length of stay:  10-14 days  Clinical Impression Very active, elderly gentleman here following SDH and s/p craniotomy.  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  Good support from wife who can provide light physical assistance.  No significant emotional distress, however, will monitor and refer for neuropsychology as indicated.  Zorina Mallin 04/24/2019, 3:53 PM

## 2019-04-24 NOTE — Care Management (Signed)
Inpatient Rehabilitation Center Individual Statement of Services  Patient Name:  Benjamin Hahn  Date:  04/24/2019  Welcome to the [facility].  Our goal is to provide you with an individualized program based on your diagnosis and situation, designed to meet your specific needs.  With this comprehensive rehabilitation program, you will be expected to participate in at least 3 hours of rehabilitation therapies Monday-Friday, with modified therapy programming on the weekends.  Your rehabilitation program will include the following services:  Physical Therapy (PT), Occupational Therapy (OT), Speech Therapy (ST), 24 hour per day rehabilitation nursing, Therapeutic Recreaction (TR), Neuropsychology, Case Management (Social Worker), Rehabilitation Medicine, Nutrition Services and Pharmacy Services  Weekly team conferences will be held on Tuesdays to discuss your progress.  Your Social Worker will talk with you frequently to get your input and to update you on team discussions.  Team conferences with you and your family in attendance may also be held.  Expected length of stay: 10-14 days   Overall anticipated outcome: supervision  Depending on your progress and recovery, your program may change. Your Social Worker will coordinate services and will keep you informed of any changes. Your Social Worker's name and contact numbers are listed  below.  The following services may also be recommended but are not provided by the [facility] will be made to provide these services after discharge if needed.  Arrangements include referral to agencies that provide these services.  Your insurance has been verified to be:  Medicare/ [place] Your primary doctor is:  [facility]  Pertinent information will be shared with your doctor and your insurance  company.  Social Worker:  [place], [place] or ([phone]   Information discussed with and copy given to patient by: Lennart Pall, 04/24/2019, 3:55 PM

## 2019-04-24 NOTE — Progress Notes (Signed)
   04/24/19 1519  MEWS Assessment  Is this an acute change? Yes  MEWS guidelines implemented *See Row Information* Yellow  Provider Notification  Provider Name/Title PA Jethro Bolus notified  Date Provider Notified 04/24/19  Time Provider Notified 1500  Notification Type Face-to-face  Notification Reason Change in status;Requested by patient/family  Response Other (Comment) (Provider ordered new parameters for bp meds, push fluids, and rest for the afternoon. Pt placed back in bed and fluids were made available. education provided to family and patient.)  Date of Provider Response 04/24/19  Time of Provider Response 1500     Pt's B/p was low. B/P was taken in both arms and manually. Pt is not symptomatic while sitting in chair. Pt was dizzy upon transfer back to bed. PA was notifiied and new parameters were given. Pt encouraged to increase fluid intake. PA did evaluate pt at bedside and pt was given time to rest. Will recheck b/p after pt's nap to ensure that B/P returns to normal.

## 2019-04-24 NOTE — Progress Notes (Signed)
PHYSICAL MEDICINE & REHABILITATION PROGRESS NOTE   Subjective/Complaints:   Pt reported that his BP was really low this AM- wasn't actually as low as pt remembered- he though 75/50- was 101/56- on Altase 1.25 mg and Toprol 12.5mg   ROS: Patient denies fever, rash, sore throat, blurred vision, nausea, vomiting, diarrhea, cough, shortness of breath or chest pain, joint or back pain,   or mood change.   Objective:   No results found. Recent Labs    04/24/19 0525  WBC 6.6  HGB 9.8*  HCT 30.3*  PLT 202   Recent Labs    04/24/19 0525  NA 138  K 4.3  CL 102  CO2 30  GLUCOSE 97  BUN 21  CREATININE 1.39*  CALCIUM 8.7*    Intake/Output Summary (Last 24 hours) at 04/24/2019 0913 Last data filed at 04/24/2019 0720 Gross per 24 hour  Intake 370 ml  Output 1400 ml  Net -1030 ml     Physical Exam: Vital Signs Blood pressure (!) 101/56, pulse 68, temperature 97.9 F (36.6 C), temperature source Oral, resp. rate 19, height 5' 7.5" (1.715 m), weight 86.7 kg, SpO2 95 %.  Constitutional: No distress . Vital signs reviewed. HEENT: EOMI, oral membranes moist  Wound is c/w pic today 8/30  Neck: supple Cardiovascular: RRR without murmur. No JVD    Respiratory: CTA Bilaterally without wheezes or rales. Normal effort    GI: BS +, non-tender, non-distended  Genitourinary: Genitourinary Comments: Indwelling Foley catheter tube in place Musculoskeletal:  General: No deformityor edema.  Neurological: alert. Normal language. Follows commands. Fair insight. Mild left inattention. Moves all 4 limbs. Mild left pronator drift. Motor 4/5 all 4. No focal sensory findings.  Skin: a few scattered bruises.  Psychiatric: pleasant   Assessment/Plan: 1. Functional deficits secondary to right SDH after fall which require 3+ hours per day of interdisciplinary therapy in a comprehensive inpatient rehab setting.  Physiatrist is providing close team supervision and 24 hour  management of active medical problems listed below.  Physiatrist and rehab team continue to assess barriers to discharge/monitor patient progress toward functional and medical goals  Care Tool:  Bathing    Body parts bathed by patient: Right arm, Left arm, Chest, Abdomen, Buttocks, Front perineal area, Right upper leg, Left upper leg, Right lower leg, Left lower leg, Face         Bathing assist Assist Level: Minimal Assistance - Patient > 75%     Upper Body Dressing/Undressing Upper body dressing   What is the patient wearing?: Button up shirt    Upper body assist Assist Level: Minimal Assistance - Patient > 75%    Lower Body Dressing/Undressing Lower body dressing      What is the patient wearing?: Pants, Underwear/pull up     Lower body assist Assist for lower body dressing: Minimal Assistance - Patient > 75%     Toileting Toileting    Toileting assist Assist for toileting: Moderate Assistance - Patient 50 - 74%     Transfers Chair/bed transfer  Transfers assist     Chair/bed transfer assist level: Moderate Assistance - Patient 50 - 74%     Locomotion Ambulation   Ambulation assist      Assist level: Minimal Assistance - Patient > 75% Assistive device: Walker-rolling Max distance: 131   Walk 10 feet activity   Assist     Assist level: Minimal Assistance - Patient > 75% Assistive device: Walker-rolling   Walk 50 feet activity  Assist    Assist level: Minimal Assistance - Patient > 75% Assistive device: Walker-rolling    Walk 150 feet activity   Assist Walk 150 feet activity did not occur: Safety/medical concerns         Walk 10 feet on uneven surface  activity   Assist Walk 10 feet on uneven surfaces activity did not occur: Safety/medical concerns         Wheelchair     Assist Will patient use wheelchair at discharge?: Yes Type of Wheelchair: Manual    Wheelchair assist level: Supervision/Verbal cueing Max  wheelchair distance: 150    Wheelchair 50 feet with 2 turns activity    Assist        Assist Level: Supervision/Verbal cueing   Wheelchair 150 feet activity     Assist  Wheelchair 150 feet activity did not occur: Safety/medical concerns   Assist Level: Supervision/Verbal cueing   Blood pressure (!) 101/56, pulse 68, temperature 97.9 F (36.6 C), temperature source Oral, resp. rate 19, height 5' 7.5" (1.715 m), weight 86.7 kg, SpO2 95 %.  Medical Problem List and Plan: 1.Decreased functional mobility with left side weaknesssecondary to right hemispheric traumatic subacute and chronic subdural hematoma with multiple falls. Status post right frontoparietal temporal craniotomy evacuation of subdural hematoma 04/19/2019 ---Continue CIR therapies including PT, OT, and SLP   2. Antithrombotics: -DVT/anticoagulation:SCDs -antiplatelet therapy: N/A 3. Pain Management:Hydrocodone as needed 4. Mood:Tofranil 10 mg daily---changed to 2000 nightly per pt request  - Wellbutrin 150 mg daily  -Effexor 37.5 mg daily  - Xanax 0.5 mg nightly as needed -antipsychotic agents: N/A 5. Neuropsych: This patientiscapable of making decisions on hisown behalf. 6. Skin/Wound Care:Routine skin checks  -continue scalp staples 7. Fluids/Electrolytes/Nutrition:Routine in and outs with follow-up chemistries 8. Hypertension. Toprol 12.5 mg daily, Altace 1.25 mg daily.   -controlled 8/30  -reassured pt that we are watching bp's  -encouraged him to eat and drink plenty of fluids  8/31- pt kept asking about BP-s will allow primary Dr Naaman Plummer to address, since [place]. Keep pushing PO fluids. 9. BPH. Noted urinary retention. 16 French coud catheter placed. Patient followed by urology services Dr Gloriann Loan. -Plan is currently to keep Foley tube until follow up office. -Flomax 0.8 mg daily. 10. Hypothyroidism. Synthroid    LOS: 3  days A FACE TO FACE EVALUATION WAS PERFORMED  Kalayla Shadden 04/24/2019, 9:13 AM

## 2019-04-24 NOTE — IPOC Note (Signed)
Overall Plan of Care [facility]) Patient Details Name: AADVIK HOERL MRN: [id number] DOB: [birth year]  Admitting Diagnosis: <principal problem not specified>  Hospital Problems: Active Problems:   Traumatic subdural hematoma (HCC)     Functional Problem List: Nursing Bladder, Endurance, Medication Management, Motor, Pain, Safety, Skin Integrity  PT Balance, Behavior, Edema, Endurance, Motor, Perception, Safety, Sensory, Skin Integrity  OT Balance, Cognition, Endurance, Motor, Pain, Perception, Safety, Skin Integrity  SLP Cognition  TR         Basic ADL's: OT Eating, Grooming, Bathing, Dressing, Toileting     Advanced  ADL's: OT Simple Meal Preparation     Transfers: PT Bed Mobility, Bed to Chair, Furniture, Musician, Floor  OT Toilet, Metallurgist: PT Ambulation, Emergency planning/management officer, Stairs     Additional Impairments: OT None  SLP Social Cognition   Problem Solving, Memory  TR      Anticipated Outcomes Item Anticipated Outcome  Self Feeding Supervision/cuing  Swallowing      Basic self-care  Supervision/cuing  Toileting  Supervision/cuing   Bathroom Transfers Supervision/cuing  Bowel/Bladder  Continent of bowel; bladder/catheter care with min assist.  Transfers  Supevision assist with LRAD  Locomotion  Supevision assist ambulatory with LRAD  Communication     Cognition  Mod I  Pain  < 3  Safety/Judgment  Mod I   Therapy Plan: PT Intensity: Minimum of 1-2 x/day ,45 to 90 minutes PT Frequency: 5 out of 7 days PT Duration Estimated Length of Stay: 10-14 days OT Intensity: Minimum of 1-2 x/day, 45 to 90 minutes OT Frequency: 5 out of 7 days OT Duration/Estimated Length of Stay: 10-12 days SLP Intensity: Minumum of 1-2 x/day, 30 to 90 minutes SLP Frequency: 3 to 5 out of 7 days SLP Duration/Estimated Length of Stay: 10 days   Due to the current state of emergency, patients may not be receiving their 3-hours of Medicare-mandated  therapy.   Team Interventions: Nursing Interventions Patient/Family Education, Bladder Management, Disease Management/Prevention, Cognitive Remediation/Compensation, Skin Care/Wound Management, Medication Management, Pain Management, Dysphagia/Aspiration Precaution Training  PT interventions Ambulation/gait training, Discharge planning, Functional mobility training, Psychosocial support, Therapeutic Activities, Visual/perceptual remediation/compensation, Wheelchair propulsion/positioning, Therapeutic Exercise, Skin care/wound management, Neuromuscular re-education, Disease management/prevention, Cognitive remediation/compensation, DME/adaptive equipment instruction, Balance/vestibular training, Pain management, Splinting/orthotics, UE/LE Strength taining/ROM, Stair training, UE/LE Coordination activities, Patient/family education, Community reintegration  OT Interventions Training and development officer, Engineer, drilling, Patient/family education, Therapeutic Activities, Wheelchair propulsion/positioning, Cognitive remediation/compensation, Psychosocial support, Therapeutic Exercise, UE/LE Strength taining/ROM, Self Care/advanced ADL retraining, Functional mobility training, Community reintegration, Discharge planning, Neuromuscular re-education, UE/LE Coordination activities, Disease mangement/prevention, Pain management, Visual/perceptual remediation/compensation  SLP Interventions Cueing hierarchy, Cognitive remediation/compensation, Functional tasks, Internal/external aids, Patient/family education  TR Interventions    SW/CM Interventions Discharge Planning, Psychosocial Support, Patient/Family Education   Barriers to Discharge MD  Medical stability, Home enviroment access/loayout, Incontinence, Medication compliance and Behavior  Nursing      PT Inaccessible home environment, Decreased caregiver support, Medical stability, Home environment access/layout    OT Medical stability     SLP      SW       Team Discharge Planning: Destination: PT-Home ,OT- Home , SLP-Home Projected Follow-up: PT-Home health PT, OT-  Home health OT, SLP-Outpatient SLP, 24 hour supervision/assistance Projected Equipment Needs: PT-To be determined, OT- To be determined, SLP-None recommended by SLP Equipment Details: PT- , OT-  Patient/family involved in discharge planning: PT- Patient,  OT-Patient, SLP-Patient  MD ELOS: 10-14 days Medical Rehab Prognosis:  Good Assessment:  Pt is an 85 yr old male with  L hemiparesis due to subacute/chronic SDH s/p multiple falls- He is s/o R craniotomy and evacuation of SDH on 04/19/2019- his BP has been running slightly on low sode but he's been pushing PO fluids- he also has Foley due to urinary retention- to keep until f/u.   See Team Conference Notes for weekly updates to the plan of care

## 2019-04-24 NOTE — Progress Notes (Signed)
Physical Therapy Session Note  Patient Details  Name: Benjamin Hahn MRN: [id number] Date of Birth: Dec 03, 1932  Today's Date: 04/24/2019 PT Individual Time: 1000-1100 PT Individual Time Calculation (min): 60 min   Short Term Goals: Week 1:  PT Short Term Goal 1 (Week 1): Pt will perform bed mobility with supervision assist. PT Short Term Goal 2 (Week 1): Pt will consistently perform stand pivot transfer with min assist and LRAD PT Short Term Goal 3 (Week 1): Pt will ambulate 135ft with min assist and LRAD  Skilled Therapeutic Interventions/Progress Updates:    min to mod assist to come to EOB with assist at trunk and for reciprocal scooting to EOB. Pt requires cues for sequencing and attention. Pt request to get dressed before leaving room but excited to "go walking." Pt requires assist with changing out of pajamas and donning new clothing seated EOB. Pt attempting to assist but ultimately requires max assist for threading of pants and underwear and assist to pull up in standing. Focused on sit <> stands with min to mod assist due to posterior lean with cues for hand placement and anterior weightshift as well as achieving upright posture once in standing. Pt requires min assist in standing during dynamic tasks. Pt able to gait in room with RW with min assist and performed oral hygiene at sink with CGA for balance only. Administered pain medication by RN. Pt propelled w/c x 50' with BLE and intermittently used UE's to assist for functional strengthening and endurance. Pt with noted decreased memory, attention, and awareness throughout session. BP monitored throughout and although there was a noted significant drop in standing, pt denies any symptoms. Pt able to gait x 120' with min assist with RW and verbal cues for safe positioning of RW, increasing BOS, increasing step length, and obstacle negotiation. Pt with very shuffled and flexed posture during gait. End of session transferred to recliner  with RW with min assist and verbal cues for sequencing and technique.   Therapy Documentation Precautions:  Precautions Precautions: Fall Precaution Comments: Rt crani Restrictions Weight Bearing Restrictions: No Vital Signs: Therapy Vitals BP: (!) (P) 125/51 Patient Position (if appropriate): (P) Sitting  See docflowsheets for full set of vitals.  Pt asymptomatic throughout session. Pain: Pain Assessment Pain Scale: 0-10 Pain Score: 7  Pain Type: Acute pain;Surgical pain Pain Location: Head Pain Orientation: Anterior;Left Pain Descriptors / Indicators: Aching Pain Frequency: Intermittent Pain Onset: On-going Patients Stated Pain Goal: 4 Pain Intervention(s): Medication (See eMAR)    Therapy/Group: Individual Therapy  [name], PT, DPT, CBIS  04/24/2019, 12:09 PM

## 2019-04-25 ENCOUNTER — Inpatient Hospital Stay (HOSPITAL_COMMUNITY): Payer: Medicare Other | Admitting: Speech Pathology

## 2019-04-25 ENCOUNTER — Inpatient Hospital Stay (HOSPITAL_COMMUNITY): Payer: Medicare Other | Admitting: Occupational Therapy

## 2019-04-25 ENCOUNTER — Inpatient Hospital Stay (HOSPITAL_COMMUNITY): Payer: Medicare Other | Admitting: Physical Therapy

## 2019-04-25 MED ORDER — TAMSULOSIN HCL 0.4 MG PO CAPS
0.4000 mg | ORAL_CAPSULE | Freq: Every day | ORAL | Status: DC
  Administered 2019-04-25 – 2019-05-05 (×11): 0.4 mg via ORAL
  Filled 2019-04-25 (×11): qty 1

## 2019-04-25 NOTE — Progress Notes (Signed)
Physical Therapy Session Note  Patient Details  Name: Benjamin Hahn MRN: [id number] Date of Birth: 1933-04-08  Today's Date: 04/25/2019 PT Individual Time: 1055-1150 PT Individual Time Calculation (min): 55 min   Short Term Goals: Week 1:  PT Short Term Goal 1 (Week 1): Pt will perform bed mobility with supervision assist. PT Short Term Goal 2 (Week 1): Pt will consistently perform stand pivot transfer with min assist and LRAD PT Short Term Goal 3 (Week 1): Pt will ambulate 127ft with min assist and LRAD  Skilled Therapeutic Interventions/Progress Updates:   Pt in recliner and agreeable to therapy, c/o HA mid-session and RN present to provide pain medication. BP 131/60 and denied dizziness throughout session. Ambulated in multiple 100-150' bouts w/ RW and CGA-min assist. Frequent verbal and visual cues for increased foot clearance and step length, RLE>LLE. Pt continues to ambulate w/ flexed posture and shuffle. Pt understands he is shuffling but unable to self-correct and states "my brain can't do it". Able to keep more steady pace and faster gait speed when ambulating w/o shuffled gait. Standing balance tasks w/o UE support to match cards, emphasis on increasing upright posture and minimizing posterior lean w/ verbal and tactile cues for forward weight shifting at hips. Blocked practice of sit<>stands from elevated mat surface w/ CGA-min assist to work on functional LE strength and balance. Visual, verbal, and tactile cues for forward trunk lean and to boost w/ LEs during sit>stand, and for static standing posture w/ cues to weight shift forward at hips. Mild improvement w/ mirror for visual feedback. Ambulated back to room w/ min assist using RW. Ended session in recliner, all needs in reach.   Therapy Documentation Precautions:  Precautions Precautions: Fall Precaution Comments: Rt crani Restrictions Weight Bearing Restrictions: No Vital Signs: Therapy Vitals Temp: 98.2 F (36.8  C) Temp Source: Oral Pulse Rate: 73 Resp: 18 BP: (!) 124/58 Patient Position (if appropriate): Sitting Oxygen Therapy SpO2: 96 % O2 Device: Room Air Pain: Pain Assessment Pain Scale: 0-10 Pain Score: 0-No pain   Therapy/Group: Individual Therapy  Emony Dormer [name] 04/25/2019, 12:21 PM

## 2019-04-25 NOTE — Plan of Care (Signed)
  Problem: Consults Goal: RH BRAIN INJURY PATIENT EDUCATION Description: Description: See Patient Education module for eduction specifics Outcome: Progressing Goal: Skin Care Protocol Initiated - if Braden Score 18 or less Description: If consults are not indicated, leave blank or document N/A Outcome: Progressing   Problem: RH BLADDER ELIMINATION Goal: RH STG MANAGE BLADDER WITH ASSISTANCE Description: STG Manage Bladder With max Assistance Outcome: Progressing Goal: RH STG MANAGE BLADDER WITH EQUIPMENT WITH ASSISTANCE Description: STG Manage Bladder With Equipment With total Assistance from caregiver Outcome: Progressing   Problem: RH SKIN INTEGRITY Goal: RH STG ABLE TO PERFORM INCISION/WOUND CARE W/ASSISTANCE Description: STG Able To Perform Incision/Wound Care With total Assistance from caregiver. Outcome: Progressing   Problem: RH SAFETY Goal: RH STG ADHERE TO SAFETY PRECAUTIONS W/ASSISTANCE/DEVICE Description: STG Adhere to Safety Precautions With supervision Assistance/Device. Outcome: Progressing   Problem: RH COGNITION-NURSING Goal: RH STG ANTICIPATES NEEDS/CALLS FOR ASSIST W/ASSIST/CUES Description: STG Anticipates Needs/Calls for Assist With mod I Assistance/Cues. Outcome: Progressing   Problem: RH PAIN MANAGEMENT Goal: RH STG PAIN MANAGED AT OR BELOW PT'S PAIN GOAL Description: < 3 Outcome: Progressing   Problem: RH KNOWLEDGE DEFICIT BRAIN INJURY Goal: RH STG INCREASE KNOWLEDGE OF SELF CARE AFTER BRAIN INJURY Description: Patient/caregiver will verbalize/demonstrate understanding of brain injury recovery as directed by care team with mod I assist. Outcome: Progressing

## 2019-04-25 NOTE — Progress Notes (Signed)
[place] PHYSICAL MEDICINE & REHABILITATION PROGRESS NOTE   Subjective/Complaints:   Feeling well this morning. Denies headache. Slept ok. bp's low yesterday  ROS: Patient denies fever, rash, sore throat, blurred vision, nausea, vomiting, diarrhea, cough, shortness of breath or chest pain, joint or back pain, headache, or mood change.    Objective:   No results found. Recent Labs    04/24/19 0525  WBC 6.6  HGB 9.8*  HCT 30.3*  PLT 202   Recent Labs    04/24/19 0525  NA 138  K 4.3  CL 102  CO2 30  GLUCOSE 97  BUN 21  CREATININE 1.39*  CALCIUM 8.7*    Intake/Output Summary (Last 24 hours) at 04/25/2019 0855 Last data filed at 04/25/2019 0730 Gross per 24 hour  Intake 360 ml  Output 1425 ml  Net -1065 ml     Physical Exam: Vital Signs Blood pressure 102/60, pulse 83, temperature 98.1 F (36.7 C), temperature source Oral, resp. rate 16, height 5' 7.5" (1.715 m), weight 86.7 kg, SpO2 96 %.  Constitutional: No distress . Vital signs reviewed. HEENT: EOMI, oral membranes moist Neck: supple Cardiovascular: RRR without murmur. No JVD    Respiratory: CTA Bilaterally without wheezes or rales. Normal effort    GI: BS +, non-tender, non-distended  Genitourinary: Genitourinary Comments: Indwelling Foley catheter tube in place Musculoskeletal:  General: No deformityor edema.  Neurological: alert. Normal language. Follows commands. Fair insight. Mild left inattention. Moves all 4 limbs. Mild left pronator drift. Motor 4/5 all 4. No focal sensory findings.  Skin: a few scattered bruises.  Psychiatric: pleasant   Assessment/Plan: 1. Functional deficits secondary to right SDH after fall which require 3+ hours per day of interdisciplinary therapy in a comprehensive inpatient rehab setting.  Physiatrist is providing close team supervision and 24 hour management of active medical problems listed below.  Physiatrist and rehab team continue to assess barriers  to discharge/monitor patient progress toward functional and medical goals  Care Tool:  Bathing    Body parts bathed by patient: Right arm, Left arm, Chest, Abdomen, Buttocks, Front perineal area, Right upper leg, Left upper leg, Right lower leg, Left lower leg, Face         Bathing assist Assist Level: Minimal Assistance - Patient > 75%     Upper Body Dressing/Undressing Upper body dressing   What is the patient wearing?: Pull over shirt    Upper body assist Assist Level: Minimal Assistance - Patient > 75%    Lower Body Dressing/Undressing Lower body dressing      What is the patient wearing?: Pants, Underwear/pull up     Lower body assist Assist for lower body dressing: Maximal Assistance - Patient 25 - 49%     Toileting Toileting    Toileting assist Assist for toileting: Moderate Assistance - Patient 50 - 74%     Transfers Chair/bed transfer  Transfers assist     Chair/bed transfer assist level: Minimal Assistance - Patient > 75%     Locomotion Ambulation   Ambulation assist      Assist level: Minimal Assistance - Patient > 75% Assistive device: Walker-rolling Max distance: 120'   Walk 10 feet activity   Assist     Assist level: Minimal Assistance - Patient > 75% Assistive device: Walker-rolling   Walk 50 feet activity   Assist    Assist level: Minimal Assistance - Patient > 75% Assistive device: Walker-rolling    Walk 150 feet activity   Assist Walk 150  feet activity did not occur: Safety/medical concerns         Walk 10 feet on uneven surface  activity   Assist Walk 10 feet on uneven surfaces activity did not occur: Safety/medical concerns         Wheelchair     Assist Will patient use wheelchair at discharge?: Yes Type of Wheelchair: Manual    Wheelchair assist level: Supervision/Verbal cueing Max wheelchair distance: 40'    Wheelchair 50 feet with 2 turns activity    Assist        Assist Level:  Supervision/Verbal cueing   Wheelchair 150 feet activity     Assist  Wheelchair 150 feet activity did not occur: Safety/medical concerns   Assist Level: Supervision/Verbal cueing   Blood pressure 102/60, pulse 83, temperature 98.1 F (36.7 C), temperature source Oral, resp. rate 16, height 5' 7.5" (1.715 m), weight 86.7 kg, SpO2 96 %.  Medical Problem List and Plan: 1.Decreased functional mobility with left side weaknesssecondary to right hemispheric traumatic subacute and chronic subdural hematoma with multiple falls. Status post right frontoparietal temporal craniotomy evacuation of subdural hematoma 04/19/2019  -f/u CT later this week --Continue CIR therapies including PT, OT, and SLP   -team conference today  2. Antithrombotics: -DVT/anticoagulation:SCDs -antiplatelet therapy: N/A 3. Pain Management:Hydrocodone as needed 4. Mood:Tofranil 10 mg daily---changed to 2000 nightly per pt request  - Wellbutrin 150 mg daily  -Effexor 37.5 mg daily  - Xanax 0.5 mg nightly as needed -antipsychotic agents: N/A 5. Neuropsych: This patientiscapable of making decisions on hisown behalf. 6. Skin/Wound Care:Routine skin checks  -continue scalp staples until end of week per NS 7. Fluids/Electrolytes/Nutrition:reasonable intake  -Cr sl elevated  -recheck BMET Thursday 8. Hypertension. Toprol 12.5 mg daily, Altace 1.25 mg daily.   -bp's still soft today  -continue to encourage him to eat and drink plenty of fluids. He's aware  -hold altace   -reduce flomax to 0.4mg  9. BPH. Noted urinary retention. 16 French coud catheter placed. Patient followed by urology services Dr Gloriann Loan. -Plan is currently to keep Foley tube until follow up office. -Flomax 0.8 mg daily---reduce per above 10. Hypothyroidism. Synthroid    LOS: 4 days A FACE TO FACE EVALUATION WAS PERFORMED  Meredith Staggers 04/25/2019, 8:55 AM

## 2019-04-25 NOTE — Progress Notes (Signed)
Vitals:   04/24/19 1817 04/24/19 2100 04/25/19 0559 04/25/19 0700  BP: (!) 108/58 122/60 (!) 108/56 102/60  Pulse:  74 72 83  Resp:  16 16   Temp:  98.1 F (36.7 C) 98.1 F (36.7 C)   TempSrc:  Oral Oral   SpO2:  96% 96%   Weight:      Height:        CBC Recent Labs    04/24/19 0525  WBC 6.6  HGB 9.8*  HCT 30.3*  PLT 202   BMET Recent Labs    04/24/19 0525  NA 138  K 4.3  CL 102  CO2 30  GLUCOSE 97  BUN 21  CREATININE 1.39*  CALCIUM 8.7*    Patient sitting up, washing with OT.  Continuing therapies at CIR.  Incision clean and dry, healing nicely.  No swelling, erythema, ecchymosis, or drainage.  Plan: We will plan on follow-up CT at the end of the week, and will DC staples at the end of the week.  Orders placed.  Hosie Spangle, MD 04/25/2019, 8:28 AM

## 2019-04-25 NOTE — Progress Notes (Signed)
Occupational Therapy Session Note  Patient Details  Name: Benjamin Hahn MRN: [id number] Date of Birth: [birth year]  Today's Date: 04/25/2019 OT Individual Time: 8675-4492 OT Individual Time Calculation (min): 45 min    Short Term Goals: Week 1:  OT Short Term Goal 1 (Week 1): Pt will complete toilet transfer with Min A sit<stand using LRAD OT Short Term Goal 2 (Week 1): Pt will complete LB dressing with supervision for dynamic standing balance OT Short Term Goal 3 (Week 1): Pt will complete shower transfer with Min A and LRAD  Skilled Therapeutic Interventions/Progress Updates:    Pt greeted seated in recliner and agreeable to OT treatment session. Pt reported need to go to the bathroom. Pt completed stand-pivot transfer recliner>toilet using grab bars and min A. Verbal cues for hand placement. CGA for balance when pulling off pants. Pt with successful BM and assistance for peri-care. Stand-pivot back to wc with min A. Pt declined bathing today but wanted to get dressed. Pt easily distracted within dressing tasks, but able to be redirected with min verbal cues. Pt needed assistance to thread LE into pant legs 2.2 catheter. Sit<>stand with min A and facilitation for anterior weight shift as pt as tendency to lean back. OT placed TED hose on pt for BP management. Stand-pivot back to recliner with min A. Pt left with chair alarm on and needs met.   Therapy Documentation Precautions:  Precautions Precautions: Fall Precaution Comments: Rt crani Restrictions Weight Bearing Restrictions: No Pain:   denies pain  Therapy/Group: Individual Therapy  Valma Cava 04/25/2019, 8:10 AM

## 2019-04-25 NOTE — Progress Notes (Signed)
Speech Language Pathology Daily Session Note  Patient Details  Name: Benjamin Hahn MRN: [id number] Date of Birth: Nov 03, 1932  Today's Date: 04/25/2019 SLP Individual Time: 1400-1500 SLP Individual Time Calculation (min): 60 min  Short Term Goals: Week 1: SLP Short Term Goal 1 (Week 1): STG=LTG due to ELOS  Skilled Therapeutic Interventions: Skilled treatment session focused on cognitive goals. SLP facilitated session by providing extra time and Min A verbal cues for patient to recall information and divided attention as clinician read a factual paragraph aloud as the patient took notes (to simulate arbitration). Patient's wife present and educated on patient's current cognitive deficits and goals of skilled SLP intervention. She verbalized understanding and agreement.  Patient left upright in recliner with wife present and all nesds within reach. Continue with current plan of care.      Pain Pain Assessment Pain Score: 0-No pain  Therapy/Group: Individual Therapy  Maeva Dant 04/25/2019, 3:05 PM

## 2019-04-25 NOTE — Progress Notes (Addendum)
Patient refused vitals for 2am & 5am. Educated on protocol. Will attempt to get it if he wakes for toileting. Patient was given sorbitol earlier to help him have a bowel movement. He knew that it had been a couple of days but did not want to take something strong that would make him go forcefully. He was anxious about taking it due to how long it may take him to get to the toilet in time. Explained that we will take him as soon as we can & that the sorbitol was the only bowel medication on his MAR. Emphasized to him how important it was to have a bowel movement & discussed causes of constipation. No acute distress noted. He did ask for tylenol for headache & it was given. His staples are intact & dry, but there is some indentation to the top right scalp area of staples that looks slightly swollen. Will continue to monitor.

## 2019-04-25 NOTE — Progress Notes (Signed)
Occupational Therapy Session Note  Patient Details  Name: RAYYAN MELLGREN MRN: [id number] Date of Birth: [birth year]  Today's Date: 04/25/2019 OT Individual Time: [id number] OT Individual Time Calculation (min): 45 min    Short Term Goals: Week 1:  OT Short Term Goal 1 (Week 1): Pt will complete toilet transfer with Min A sit<stand using LRAD OT Short Term Goal 2 (Week 1): Pt will complete LB dressing with supervision for dynamic standing balance OT Short Term Goal 3 (Week 1): Pt will complete shower transfer with Min A and LRAD  Skilled Therapeutic Interventions/Progress Updates:    1:1 Pt received in recliner. Assisted pt make out daily therapy schedule on his white board. Pt then ambulated with RW with min A to the dayroom but required max multimodal cues for RW safety and to increased step length. Pt with shuffling gait with high risk of tripping. Pt with difficulty clearing his left foot. Pt able to demonstrate a "normal" step for 3 alternating steps but then goes back into shuffling pattern.   Pt requested using the Nustep performed 5 min on the bike at a resistance level 4. Pt required cues to terminate task at five min due to decr recall to time request.   Ambulated to mat with min A. Continued to address balance with alternating large stepping, marching and stepping onto targets. Activity improved his ambulation pattern and safety but did not carry through his entire walk back to the room. Pt still required min A for all tasks with frequent sitting rest breaks.   Therapy Documentation Precautions:  Precautions Precautions: Fall Precaution Comments: Rt crani Restrictions Weight Bearing Restrictions: No Pain: Pain Assessment Pain Score: 0-No pain   Therapy/Group: Individual Therapy  Willeen [facility] 04/25/2019, 4:00 PM

## 2019-04-26 ENCOUNTER — Inpatient Hospital Stay (HOSPITAL_COMMUNITY): Payer: Medicare Other | Admitting: *Deleted

## 2019-04-26 ENCOUNTER — Inpatient Hospital Stay (HOSPITAL_COMMUNITY): Payer: Medicare Other

## 2019-04-26 ENCOUNTER — Inpatient Hospital Stay (HOSPITAL_COMMUNITY): Payer: Medicare Other | Admitting: Physical Therapy

## 2019-04-26 ENCOUNTER — Inpatient Hospital Stay (HOSPITAL_COMMUNITY): Payer: Medicare Other | Admitting: Speech Pathology

## 2019-04-26 ENCOUNTER — Inpatient Hospital Stay (HOSPITAL_COMMUNITY): Payer: Medicare Other | Admitting: Occupational Therapy

## 2019-04-26 ENCOUNTER — Encounter (HOSPITAL_COMMUNITY): Payer: Medicare Other | Admitting: Psychology

## 2019-04-26 DIAGNOSIS — S065X0S Traumatic subdural hemorrhage without loss of consciousness, sequela: Secondary | ICD-10-CM

## 2019-04-26 NOTE — Progress Notes (Signed)
Social Work Patient ID: Benjamin Hahn, male   DOB: April 13, 1933, 85 y.o.   MRN: [id number]   Met with pt and wife yesterday following conference.  Both aware and agreeable with targeted d/c date of 9/12 and supervision goals overall.  Pt and wife will received guidance from MD/ PT and neuropsychology in terms of readiness for work return.  Larcenia Holaday, LCSW

## 2019-04-26 NOTE — Plan of Care (Signed)
  Problem: Consults Goal: RH BRAIN INJURY PATIENT EDUCATION Description: Description: See Patient Education module for eduction specifics Outcome: Progressing Goal: Skin Care Protocol Initiated - if Braden Score 18 or less Description: If consults are not indicated, leave blank or document N/A Outcome: Progressing   Problem: RH BLADDER ELIMINATION Goal: RH STG MANAGE BLADDER WITH ASSISTANCE Description: STG Manage Bladder With max Assistance Outcome: Progressing Goal: RH STG MANAGE BLADDER WITH EQUIPMENT WITH ASSISTANCE Description: STG Manage Bladder With Equipment With total Assistance from caregiver Outcome: Progressing   Problem: RH SKIN INTEGRITY Goal: RH STG ABLE TO PERFORM INCISION/WOUND CARE W/ASSISTANCE Description: STG Able To Perform Incision/Wound Care With total Assistance from caregiver. Outcome: Progressing   Problem: RH SAFETY Goal: RH STG ADHERE TO SAFETY PRECAUTIONS W/ASSISTANCE/DEVICE Description: STG Adhere to Safety Precautions With supervision Assistance/Device. Outcome: Progressing   Problem: RH COGNITION-NURSING Goal: RH STG ANTICIPATES NEEDS/CALLS FOR ASSIST W/ASSIST/CUES Description: STG Anticipates Needs/Calls for Assist With mod I Assistance/Cues. Outcome: Progressing   Problem: RH PAIN MANAGEMENT Goal: RH STG PAIN MANAGED AT OR BELOW PT'S PAIN GOAL Description: < 3 Outcome: Progressing   Problem: RH KNOWLEDGE DEFICIT BRAIN INJURY Goal: RH STG INCREASE KNOWLEDGE OF SELF CARE AFTER BRAIN INJURY Description: Patient/caregiver will verbalize/demonstrate understanding of brain injury recovery as directed by care team with mod I assist. Outcome: Progressing

## 2019-04-26 NOTE — Progress Notes (Signed)
Physical Therapy Session Note  Patient Details  Name: Benjamin Hahn MRN: [id number] Date of Birth: [birth year]  Today's Date: 04/26/2019 PT Individual Time: 0815-0925 PT Individual Time Calculation (min): 70 min   Short Term Goals: Week 1:  PT Short Term Goal 1 (Week 1): Pt will perform bed mobility with supervision assist. PT Short Term Goal 2 (Week 1): Pt will consistently perform stand pivot transfer with min assist and LRAD PT Short Term Goal 3 (Week 1): Pt will ambulate 17ft with min assist and LRAD  Skilled Therapeutic Interventions/Progress Updates:   Pt in recliner and agreeable to therapy, no c/o pain. Requesting assistance w/ changing out of night clothes. Min assist needed to remove night shirt w/ buttons and sleeves, supervision to don t-shirt. Total assist to thread foley in underwear and pants, min-mod assist to doff/don LE garments over hips in standing. Total assist TEDs, min assist to don shoes w/ increased time. Min verbal and tactile cues throughout dressing for technique, sequencing, and attention to task. Pt verbose this morning but easily redirected. Stood at sink for 3 min and then 10 min to shave face w/ min assist to set-up supplies and CGA-min assist for static standing balance, tactile and verbal cues throughout for upright posture. Pt's LEs go into flexed position w/ fatigue and prolonged standing, min verbal cues to correct. Ambulated to/from day room w/ min assist using RW, max verbal and visual cues for gait pattern - to increase B foot clearance and step length. NuStep 5 min x2 @ level 3 to work on reciprocal movement pattern and increasing amplitude of movements. Frequent verbal and tactile cues to increase amplitude and to extend LEs completely, pt needed minimally distracting environment to perform task correctly. Pt w/ very Parkinsonian-like shuffle and small amplitude movements w/ all activities. Returned to room. Ended session in recliner, all needs in reach.    Therapy Documentation Precautions:  Precautions Precautions: Fall Precaution Comments: Rt crani Restrictions Weight Bearing Restrictions: No Vital Signs:   Pain: Pain Assessment Pain Scale: 0-10 Pain Score: 3  Pain Type: Acute pain Pain Location: Head Pain Descriptors / Indicators: Aching Pain Frequency: Intermittent Pain Onset: Gradual Patients Stated Pain Goal: 0 Pain Intervention(s): Medication (See eMAR)  Therapy/Group: Individual Therapy  Seyon Strader [name] 04/26/2019, 9:26 AM

## 2019-04-26 NOTE — Progress Notes (Signed)
Speech Language Pathology Daily Session Note  Patient Details  Name: Benjamin Hahn MRN: [id number] Date of Birth: 1932/12/17  Today's Date: 04/26/2019 SLP Individual Time: 1035-1105 SLP Individual Time Calculation (min): 30 min  Short Term Goals: Week 1: SLP Short Term Goal 1 (Week 1): STG=LTG due to ELOS  Skilled Therapeutic Interventions: Skilled treatment session focused on cognitive goals. SLP facilitated session by providing extra time and Min-Mod A verbal cues for problem solving and recall during a basic money management task. Patient asking questions about "name of procedure" that he had. SLP provided an external aid with information since patient could not find the same information he had previously written down due to decreased organization of notes. Patient demonstrated emergent awareness of difficulty of task and reported, "I think we hit some high notes here, I am disorganized with no memory." SLP provided support and encouragement. Patient left upright in recliner with alarm on and all needs within reach. Continue with current plan of care.      Pain No/Denies Pain   Therapy/Group: Individual Therapy  Calix Heinbaugh 04/26/2019, 1:19 PM

## 2019-04-26 NOTE — Consult Note (Signed)
Neuropsychological Consultation   Patient:   Benjamin Hahn   DOB:   May 25, 1933  MR Number:  [id number]  Location:  [facility] [place] [id number] [place] [address] Dept: [place]: [phone]           Date of Service:   04/26/2019  Start Time:   10 AM End Time:   11 AM  Provider/Observer:  Ilean Skill, Psy.D.       Clinical Neuropsychologist       Billing Code/Service: [id number] 4 Units  Chief Complaint:    Benjamin Hahn is an 85 year old male with a history of BPH, hypertension, hyperlipidemia and occasional alcohol use.  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  The patient presented on 04/17/2019 with reported multiple falls in the past 2 months with no loss of consciousness as well as the recent development of left hemiparesis.  The patient describes several of these falls including falling when attempting to go up his steps but a significant fall in his driveway where he fell and struck the door of his garage with his head.  The patient denies any events that resulted in loss of consciousness.  The patient's recent MRI showed a large right cerebral convexity subdural hematoma with a 7 mm leftward midline shift identified.  The patient underwent right frontoparietal temporal craniotomy and evacuation of subdural hematoma.  Follow-up CT on 8/28 showed decrease in size of subdural hematoma and improvement in midline shift.  The patient has been improving overall but continues to describe issues of short-term memory changes and significant headache.    Reason for Service:  The patient was referred for neuropsychological consultation due to coping and adjustment issues as well as to perform a cognitive screen to assess residual or acute cognitive changes.  Below is the HPI for the current admission.  Benjamin Hahn is  a 85 year old right-handed male with history of BPH,hypertension,hyperlipidemia and occasional alcohol. Per chart review patient lives with spouse. Two-level home with bedroom on Main level with level entry to the home. 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 Underwent right frontoparietal temporal craniotomy and evacuation of subdural hematoma 04/19/2019 per Dr. [name]. Follow-up CT of the brain 04/21/2019 shows decrease in size of subdural improvement in midline shiftand plan to remove Jackson-Pratt drain. Hospital course urinary retention with history of BPH and bladder scan 682 cc with a 16 French coud catheter placed and plans to continue Foley tube until follow-up with urology services as patient has been followed by Dr. [name] in the past. Patient was placed on Flomax. Tolerating a regular diet. Therapy evaluations completed and patient was admitted for a comprehensive rehab program   Current Status:  The patient was alert and oriented today but did show some possible decrease in overall information processing speed.  The patient describes subjective symptoms related to problems with short-term memory and that it is been difficult for him in the hospital to know exactly what day it is but that has to do with him not going to work or having his anchors himself.  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  The patient reports that he does have a strong interest in getting back to his practice and describes efforts on his wife's part to try to facilitate that.  The patient did appear to be improving from the residual effects of his subdural hematoma but is continuing to have motor deficits and  other issues that we will need to keep being addressed rather than him being ready to return to work at this time.  The patient did show some short-term memory and mental status issues that were not severe but were clearly present.  Significant motor deficits were present.  The patient showed reasonably good reasoning and problem-solving abilities and his expressive and receptive language appeared to be intact.  The patient's memory for long-term information appeared to be intact in his historical knowledge of his life and practice appeared to be intact.  Behavioral Observation: Benjamin Hahn  presents as a 85 y.o.-year-old Right Caucasian Male who appeared his stated age. his dress was Appropriate and he was Well Groomed and his manners were Appropriate to the situation.  his participation was indicative of Appropriate and Attentive behaviors.  There were any physical disabilities noted.  he displayed an appropriate level of cooperation and motivation.     Interactions:    Active Appropriate and Redirectable  Attention:   abnormal and attention span appeared shorter than expected for age  Memory:   abnormal; remote memory intact, recent memory impaired  Visuo-spatial:  not examined  Speech (Volume):  low  Speech:   normal; normal  Thought Process:  Coherent and Relevant  Though Content:  WNL; not suicidal and not homicidal  Orientation:   person, place, time/date and situation although he did have troubles at time of knowing the exact day of the week it was he was oriented to time and date generally.  Judgment:   Good  Planning:   Fair  Affect:    Appropriate  Mood:    Dysphoric  Insight:   Good  Intelligence:   very high     Medical History:   Past Medical History:  Diagnosis Date  . Anxiety   . Arthritis    might be in back, no problems  . BPH (benign prostatic hyperplasia)   . Depression 1990s  . GERD (gastroesophageal reflux disease)    occasional  .  Hypercholesteremia    controled  . Hypothyroid   . Shingles May 2014   "Right face, still has some"  . Temporal arteritis ([place])   . Transient blindness of both eyes    Psychiatric History:  The patient does have a past history of anxiety and depression but denied any severe or significant anxiety and depression at the current time.  The patient reports that he is coping generally well for the fact that he has had this surgical intervention for subdural hematoma with residual motor deficits and that while his symptoms are improving he continues to be anxious about his ability to return to work as soon as possible.  Family Med/Psych History:  Family History  Problem Relation Age of Onset  . Stroke Father        Died, 80s  . Stroke Sister        Living, 73  . Heart disease Mother        Died, 44  . Healthy Daughter   . Diabetes Maternal Grandmother   . Hypertension Neg Hx     Impression/DX:  Benjamin Hahn  is an 85 year old male with a history of BPH, hypertension, hyperlipidemia and occasional alcohol use.  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  The patient presented on 04/17/2019 with reported multiple falls in the past 2 months with no loss of consciousness as well as the recent development of left hemiparesis.  The patient describes several of these falls including falling when attempting to go up his steps but a significant fall in his driveway where he fell and struck the door of his garage with his head.  The patient denies any events that resulted in loss of consciousness.  The patient's recent MRI showed a large right cerebral convexity subdural hematoma with a 7 mm leftward midline shift identified.  The patient underwent right frontoparietal temporal craniotomy and evacuation of subdural hematoma.  Follow-up CT on 8/28 showed decrease in size of subdural hematoma and improvement in midline shift.  The patient has been  improving overall but continues to describe issues of short-term memory changes and significant headache.    The patient was alert and oriented today but did show some possible decrease in overall information processing speed.  The patient describes subjective symptoms related to problems with short-term memory and that it is been difficult for him in the hospital to know exactly what day it is but that has to do with him not going to work or having his anchors himself.  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  The patient reports that he does have a strong interest in getting back to his practice and describes efforts on his wife's part to try to facilitate that.  The patient did appear to be improving from the residual effects of his subdural hematoma but is continuing to have motor deficits and other issues that we will need to keep being addressed rather than him being ready to return to work at this time.  The patient did show some short-term memory and mental status issues that were not severe but were clearly present.  Significant motor deficits were present.  The patient showed reasonably good reasoning and problem-solving abilities and his expressive and receptive language appeared to be intact.  The patient's memory for long-term information appeared to be intact in his historical knowledge of his life and practice appeared to be intact.   Disposition/Plan:  I will follow-up with the patient first of next week and while it does appear that he is improving from the residual effects of his right subdural hematoma there continue to be problematic issues.  Considering the location of the midline shift in the right frontal temporal region it would be typical for an overall slowing of cognitive information processing and some potential executive functioning deficits.  The patient does clearly describe some short-term memory changes and difficulties  with retrieving information although his knowledge and retrieval of historical information appeared to be accurate.  Given the nature and symptoms of his recent neurological event the patient should not be returning to work anytime soon.  We are setting up the patient for follow-up with me on an outpatient basis once he is discharged to look at cognitive/neuropsychological assessment to determine his readiness to return to work.  Diagnosis:   Right SDH        Electronically Signed   _______________________ Ilean Skill, Psy.D.

## 2019-04-26 NOTE — Progress Notes (Signed)
[facility] PHYSICAL MEDICINE & REHABILITATION PROGRESS NOTE   Subjective/Complaints:   No issues overnite , mild frontal HA this am   ROS: Patient denies  cough, shortness of breath or chest pain, joint or back pain,    Objective:   No results found. Recent Labs    04/24/19 0525  WBC 6.6  HGB 9.8*  HCT 30.3*  PLT 202   Recent Labs    04/24/19 0525  NA 138  K 4.3  CL 102  CO2 30  GLUCOSE 97  BUN 21  CREATININE 1.39*  CALCIUM 8.7*    Intake/Output Summary (Last 24 hours) at 04/26/2019 0734 Last data filed at 04/26/2019 0500 Gross per 24 hour  Intake 600 ml  Output 1250 ml  Net -650 ml     Physical Exam: Vital Signs Blood pressure 136/65, pulse 74, temperature 98.1 F (36.7 C), temperature source Oral, resp. rate 16, height 5' 7.5" (1.715 m), weight 87.1 kg, SpO2 96 %.  Constitutional: No distress . Vital signs reviewed. HEENT: EOMI, oral membranes moist Neck: supple Cardiovascular: RRR without murmur. No JVD    Respiratory: CTA Bilaterally without wheezes or rales. Normal effort    GI: BS +, non-tender, non-distended  Genitourinary: Genitourinary Comments: Indwelling Foley catheter tube in place Musculoskeletal:  General: No deformityor edema.  Neurological: alert. Normal language. Follows commands. Fair insight. Mild left inattention. Moves all 4 limbs. Mild left pronator drift. Motor 4/5 all 4. No focal sensory findings.  Skin: a few scattered bruises.  Psychiatric: pleasant   Assessment/Plan: 1. Functional deficits secondary to right SDH after fall which require 3+ hours per day of interdisciplinary therapy in a comprehensive inpatient rehab setting.  Physiatrist is providing close team supervision and 24 hour management of active medical problems listed below.  Physiatrist and rehab team continue to assess barriers to discharge/monitor patient progress toward functional and medical goals  Care Tool:  Bathing    Body parts bathed  by patient: Right arm, Left arm, Chest, Abdomen, Buttocks, Front perineal area, Right upper leg, Left upper leg, Right lower leg, Left lower leg, Face         Bathing assist Assist Level: Minimal Assistance - Patient > 75%     Upper Body Dressing/Undressing Upper body dressing   What is the patient wearing?: Pull over shirt    Upper body assist Assist Level: Minimal Assistance - Patient > 75%    Lower Body Dressing/Undressing Lower body dressing      What is the patient wearing?: Pants, Underwear/pull up     Lower body assist Assist for lower body dressing: Maximal Assistance - Patient 25 - 49%     Toileting Toileting    Toileting assist Assist for toileting: Moderate Assistance - Patient 50 - 74%     Transfers Chair/bed transfer  Transfers assist     Chair/bed transfer assist level: Minimal Assistance - Patient > 75%     Locomotion Ambulation   Ambulation assist      Assist level: Minimal Assistance - Patient > 75% Assistive device: Walker-rolling Max distance: 150'   Walk 10 feet activity   Assist     Assist level: Minimal Assistance - Patient > 75% Assistive device: Walker-rolling   Walk 50 feet activity   Assist    Assist level: Minimal Assistance - Patient > 75% Assistive device: Walker-rolling    Walk 150 feet activity   Assist Walk 150 feet activity did not occur: Safety/medical concerns  Assist level: Minimal Assistance -  Patient > 75% Assistive device: Walker-rolling    Walk 10 feet on uneven surface  activity   Assist Walk 10 feet on uneven surfaces activity did not occur: Safety/medical concerns         Wheelchair     Assist Will patient use wheelchair at discharge?: Yes Type of Wheelchair: Manual    Wheelchair assist level: Supervision/Verbal cueing Max wheelchair distance: 67'    Wheelchair 50 feet with 2 turns activity    Assist        Assist Level: Supervision/Verbal cueing   Wheelchair 150  feet activity     Assist  Wheelchair 150 feet activity did not occur: Safety/medical concerns   Assist Level: Supervision/Verbal cueing   Blood pressure 136/65, pulse 74, temperature 98.1 F (36.7 C), temperature source Oral, resp. rate 16, height 5' 7.5" (1.715 m), weight 87.1 kg, SpO2 96 %.  Medical Problem List and Plan: 1.Decreased functional mobility with left side weaknesssecondary to right hemispheric traumatic subacute and chronic subdural hematoma with multiple falls. Status post right frontoparietal temporal craniotomy evacuation of subdural hematoma 04/19/2019  -f/u CT later this week --Continue CIR therapies including PT, OT, and SLP Has >4h therapy today, pty c/o that this is too much   2. Antithrombotics: -DVT/anticoagulation:SCDs -antiplatelet therapy: N/A 3. Pain Management:Hydrocodone as needed 4. Mood:Tofranil 10 mg daily---changed to 2000 nightly per pt request  - Wellbutrin 150 mg daily  -Effexor 37.5 mg daily  - Xanax 0.5 mg nightly as needed -antipsychotic agents: N/A 5. Neuropsych: This patientiscapable of making decisions on hisown behalf. 6. Skin/Wound Care:Routine skin checks  -continue scalp staples until end of week per NS 7. Fluids/Electrolytes/Nutrition:reasonable intake  -Cr sl elevated  -recheck BMET Thursday 8. Hypertension. Toprol 12.5 mg daily, Altace 1.25 mg daily.   - Vitals:   04/25/19 1944 04/26/19 0522  BP: (!) 142/75 136/65  Pulse: 88 74  Resp: 15 16  Temp: 98.6 F (37 C) 98.1 F (36.7 C)  SpO2: 98% 96%  controlled 9/2  -continue to encourage him to eat and drink plenty of fluids. He's aware  -hold altace   -reduce flomax to 0.4mg  9. BPH. Noted urinary retention. 16 French coud catheter placed. Patient followed by urology services Dr Gloriann Loan. -Plan is currently to keep Foley tube until follow up office. -Flomax 0.8 mg daily---reduce per above 10.  Hypothyroidism. Synthroid    LOS: 5 days A FACE TO FACE EVALUATION WAS PERFORMED  Charlett Blake 04/26/2019, 7:34 AM

## 2019-04-26 NOTE — Evaluation (Signed)
Recreational Therapy Assessment and Plan  Patient Details  Name: Benjamin Hahn MRN: [id number] Date of Birth: [birth year] Today's Date: 04/26/2019  Rehab Potential: Good ELOS:   2 weeks  Assessment  Problem List:      Patient Active Problem List   Diagnosis Date Noted  . Traumatic subdural hematoma ([place]) 04/21/2019  . Subdural hematoma ([place]) 04/17/2019  . Confusion 04/15/2019  . Urinary frequency 04/15/2019  . Memory changes 04/15/2019  . Hyperglycemia 04/15/2019  . Shuffling gait 04/15/2019  . Weakness generalized 04/14/2019  . Mild intermittent asthma with acute exacerbation 02/08/2018  . Hypothyroidism 03/14/2013  . Hyperlipidemia LDL goal <70 03/14/2013  . BPH (benign prostatic hypertrophy) 03/14/2013  . Hypogonadism, male 03/14/2013  . Erectile dysfunction 03/14/2013    Past Medical History:      Past Medical History:  Diagnosis Date  . Anxiety   . Arthritis    might be in back, no problems  . BPH (benign prostatic hyperplasia)   . Depression 1990s  . GERD (gastroesophageal reflux disease)    occasional  . Hypercholesteremia    controled  . Hypothyroid   . Shingles May 2014   "Right face, still has some"  . Temporal arteritis ([place])   . Transient blindness of both eyes    Past Surgical History:       Past Surgical History:  Procedure Laterality Date  . ARTERY BIOPSY Right 06/20/2013   Procedure: BIOPSY TEMPORAL ARTERY RIGHT;  Surgeon: Earnstine Regal, MD;  Location: [facility];  Service: General;  Laterality: Right;  . CARDIAC CATHETERIZATION N/A 09/02/2015   Procedure: Left Heart Cath and Coronary Angiography;  Surgeon: Charolette Forward, MD;  Location: [facility];  Service: Cardiovascular;  Laterality: N/A;  . CARDIAC CATHETERIZATION N/A 09/02/2015   Procedure: Coronary Stent Intervention;  Surgeon: Charolette Forward, MD;  Location: [facility];  Service: Cardiovascular;  Laterality: N/A;  1.  mid RCA      (3.0/28mm Xience) 2.  Mid  LAD      (3.0/23mm Xience)  . CRANIOTOMY N/A 04/19/2019   Procedure: CRANIOTOMY HEMATOMA EVACUATION SUBDURAL;  Surgeon: Jovita Gamma, MD;  Location: [facility];  Service: Neurosurgery;  Laterality: N/A;  CRANIOTOMY HEMATOMA EVACUATION SUBDURAL  . None      Assessment & Plan Clinical Impression: Patient is a 85 year old right-handed male with history of BPH,hypertension,hyperlipidemia and occasional alcohol. Per chart review patient lives with spouse. Two-level home with bedroom on Main level with level entry to the home. 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 Underwent right frontoparietal temporal craniotomy and evacuation of subdural hematoma 04/19/2019 per Dr. [name]. Follow-up CT of the brain 04/21/2019 shows decrease in size of subdural improvement in midline shiftand plan to remove Jackson-Pratt drain. Hospital course urinary retention with history of BPH and bladder scan 682 cc with a 16 French coud catheter placed and plans to continue Foley tube until follow-up with urology services as patient has been followed by Dr. [name] in the past.  Patient transferred to [facility] on 04/21/2019 .   Pt presents with decreased activity tolerance, decreased functional mobility, decreased balance, decreased coordination, left inattention, decreased attention, decreased awareness, decreased problem solving, decreased safety awareness, decreased memory and delayed processing Limiting pt's independence with leisure/community pursuits.  Leisure History/Participation Premorbid leisure interest/current participation: Sports - Exercise (Comment);Community - Travel (Comment);Community - Other (Comment)(still working PT in his law firm) Other Leisure  Interests: Television;Reading Leisure Participation Style: With Family/Friends Awareness of Community Resources: Good-identify 3 post discharge leisure resources Psychosocial / Spiritual Social interaction - Mood/Behavior: Cooperative Academic librarian Appropriate for Education?: Yes Strengths/Weaknesses Patient Strengths/Abilities: Willingness to participate;Active premorbidly Patient weaknesses: Physical limitations TR Patient demonstrates impairments in the following area(s): Endurance;Motor;Safety  Plan Rec Therapy Plan Is patient appropriate for Therapeutic Recreation?: Yes Rehab Potential: Good Treatment times per week: Min 1 TR session per week >20 minutes TR Treatment/Interventions: Adaptive equipment instruction;Community reintegration;Patient/family education;Therapeutic exercise;1:1 session;Functional mobility training;Balance/vestibular training;Recreation/leisure participation;Cognitive remediation/compensation;Therapeutic activities Recommendations for other services: Neuropsych  Recommendations for other services: Neuropsych  Discharge Criteria: Patient will be discharged from TR if patient refuses treatment 3 consecutive times without medical reason.  If treatment goals not met, if there is a change in medical status, if patient makes no progress towards goals or if patient is discharged from hospital.  The above assessment, treatment plan, treatment alternatives and goals were discussed and mutually agreed upon: by patient  [place] 04/26/2019, 2:08 PM

## 2019-04-26 NOTE — Progress Notes (Signed)
Occupational Therapy Session Note  Patient Details  Name: Benjamin Hahn MRN: [id number] Date of Birth: August 21, 1933  Today's Date: 04/26/2019 OT Individual Time: 1315-1415 60 min  Short Term Goals: Week 1:  OT Short Term Goal 1 (Week 1): Pt will complete toilet transfer with Min A sit<stand using LRAD OT Short Term Goal 2 (Week 1): Pt will complete LB dressing with supervision for dynamic standing balance OT Short Term Goal 3 (Week 1): Pt will complete shower transfer with Min A and LRAD  Skilled Therapeutic Interventions/Progress Updates:    1:1. Pt missed 15 min of lunch d/t eating and pt requesting time to consume meal. Pt completes ambulation in hallway 2x into/out of room recliner<>w/c with RW and VC for anterior weight shift, large steps and picking up feet to improve functional mobility. Pt completes seated dowel rod HEP for BUE strenghening required for BADLs. Pt limited shoulder mobility d/t B rotator cuff issues. Pt completes standing dynavision with CGA 2.7-2.8 seconds reaction time to locate stimulus in lower 2 quadrants. Standing reaching with forced use of LUE for graded clothes pins with CGA with VC for anterior weight shift. Exited session with pt seated in w/c, call light tin reach and all needs met.  Therapy Documentation Precautions:  Precautions Precautions: Fall Precaution Comments: Rt crani Restrictions Weight Bearing Restrictions: No General:   Vital Signs:   Pain:   ADL: ADL Eating: Not assessed Grooming: Moderate assistance(shaving) Where Assessed-Grooming: Sitting at sink Upper Body Bathing: Supervision/safety Where Assessed-Upper Body Bathing: Sitting at sink Lower Body Bathing: Minimal assistance Where Assessed-Lower Body Bathing: Sitting at sink, Standing at sink Upper Body Dressing: Supervision/safety Where Assessed-Upper Body Dressing: Sitting at sink Lower Body Dressing: Moderate assistance Where Assessed-Lower Body Dressing: Sitting at sink,  Standing at sink Toileting: Not assessed Toilet Transfer: Moderate assistance Toilet Transfer Method: Counselling psychologist: Energy manager: Not assessed Vision   Perception    Praxis   Exercises:   Other Treatments:     Therapy/Group: Individual Therapy  Tonny Branch 04/26/2019, 2:17 PM

## 2019-04-26 NOTE — Progress Notes (Signed)
Occupational Therapy Session Note  Patient Details  Name: Benjamin Hahn MRN: [id number] Date of Birth: [birth year]  Today's Date: 04/26/2019 OT Individual Time: 1130-1200 OT Individual Time Calculation (min): 30 min    Short Term Goals: Week 1:  OT Short Term Goal 1 (Week 1): Pt will complete toilet transfer with Min A sit<stand using LRAD OT Short Term Goal 2 (Week 1): Pt will complete LB dressing with supervision for dynamic standing balance OT Short Term Goal 3 (Week 1): Pt will complete shower transfer with Min A and LRAD  Skilled Therapeutic Interventions/Progress Updates:    1:1 Pt received in the recliner. Pt ambulated from his room ~50 feet before a seated break. Pt continues to require max multimodal cues for increasing stepping pattern to dec shuffling pattern and to remain close to the RW.  In the gym continued to focus on balance and weight shifts. Pt able to step onto the [employer] with bilateral UE support with contact guard. Performed Limits of Stability activity with bilateraly Ue support. Pt with difficulty with anterior weight shifts requiring facilitation. Pt required more than reasonable time to perform motor task after visual cue.   Returned to room and ambulated from door to recliner and did demonstrate WFL step length.  Left with chair alarm in place.   Therapy Documentation Precautions:  Precautions Precautions: Fall Precaution Comments: Rt crani Restrictions Weight Bearing Restrictions: No General: General OT Amount of Missed Time: 15 Minutes Vital Signs: Therapy Vitals Temp: 98.3 F (36.8 C) Pulse Rate: 72 BP: 104/69 Patient Position (if appropriate): Sitting Oxygen Therapy SpO2: 97 % O2 Device: Room Air Pain: Pain Assessment Pain Scale: 0-10 Pain Score: 6  Pain Type: Acute pain Pain Location: Head Pain Descriptors / Indicators: Aching Pain Onset: Gradual Patients Stated Pain Goal: 0 Pain Intervention(s): Medication (See  eMAR) ADL: ADL Eating: Not assessed Grooming: Moderate assistance(shaving) Where Assessed-Grooming: Sitting at sink Upper Body Bathing: Supervision/safety Where Assessed-Upper Body Bathing: Sitting at sink Lower Body Bathing: Minimal assistance Where Assessed-Lower Body Bathing: Sitting at sink, Standing at sink Upper Body Dressing: Supervision/safety Where Assessed-Upper Body Dressing: Sitting at sink Lower Body Dressing: Moderate assistance Where Assessed-Lower Body Dressing: Sitting at sink, Standing at sink Toileting: Not assessed Toilet Transfer: Moderate assistance Toilet Transfer Method: Counselling psychologist: Energy manager: Not assessed Vision   Perception    Praxis   Exercises:   Other Treatments:     Therapy/Group: Individual Therapy  Willeen Cass [facility] 04/26/2019, 4:13 PM

## 2019-04-26 NOTE — Patient Care Conference (Addendum)
Inpatient RehabilitationTeam Conference and Plan of Care Update Date: 04/25/2019   Time: 10:35 AM    Patient Name: Benjamin Hahn      Medical Record Number: [id number]  Date of Birth: [birth year] Sex: Male         Room/Bed: 4W05C/4W05C-01 Payor Info: Payor: MEDICARE / Plan: MEDICARE PART A AND B / Product Type: *No Product type* /    Admitting Diagnosis: 2. ABI Team  Closed TBI, SDH; 11-14days  Admit Date/Time:  04/21/2019  1:55 PM Admission Comments: No comment available   Primary Diagnosis:  <principal problem not specified> Principal Problem: <principal problem not specified>  Patient Active Problem List   Diagnosis Date Noted  . Traumatic subdural hematoma ([place]) 04/21/2019  . Subdural hematoma ([place]) 04/17/2019  . Confusion 04/15/2019  . Urinary frequency 04/15/2019  . Memory changes 04/15/2019  . Hyperglycemia 04/15/2019  . Shuffling gait 04/15/2019  . Weakness generalized 04/14/2019  . Mild intermittent asthma with acute exacerbation 02/08/2018  . Hypothyroidism 03/14/2013  . Hyperlipidemia LDL goal <70 03/14/2013  . BPH (benign prostatic hypertrophy) 03/14/2013  . Hypogonadism, male 03/14/2013  . Erectile dysfunction 03/14/2013    Expected Discharge Date: Expected Discharge Date: 05/06/19  Team Members Present: Physician leading conference: Dr. Alger Simons Social Worker Present: Lennart Pall, LCSW Nurse Present: Rayetta Pigg, RN PT Present: Burnard Bunting, PT OT Present: Cherylynn Ridges, OT SLP Present: Weston Anna, SLP PPS Coordinator present : Ileana Ladd, PT     Current Status/Progress Goal Weekly Team Focus  Medical   right SDH. bp has been soft. moved bowels today.  improve functional mobility  bp, nutrition, wound care   Bowel/Bladder   Continent of bowel. LBM 9/03. Coude indwelling catheter remains in place. no complications.  Remain continent with mod I.  Assess bowel pattern. Assess if foley will be needed for DC.   Swallow/Nutrition/  Hydration             ADL's   MIN/Mod A overall  Supervision  balance, activity tolerance, cognition, motor planning, fine motor cooridnation, self-care retraining   Mobility   min to mod assist overall for sit <> stands, min assist transfers and gait; min to mod assist stairs  supervision overall  sit <> stands, balance retraining, cognitive remediation, endurance, upright tolerance/BP mangement, transfers, gait, stairs   Communication             Safety/Cognition/ Behavioral Observations  Min A  Mod I  problem solving and recall   Pain   no complaints of pain at this time.  remain free of pain.  assess pain per protocol.   Skin   Incision to right side of head. Staples removed today. site is clean dry intact. no bleeding or drainage noted.  Skin remains intact.  Assess skin per protocol. report abnormal findings.    Rehab Goals Patient on target to meet rehab goals: Yes *See Care Plan and progress notes for long and short-term goals.     Barriers to Discharge  Current Status/Progress Possible Resolutions Date Resolved   Physician                    Nursing                  PT  Lack of/limited family support;Medical stability  wife can only provide supervision              OT  SLP                SW                Discharge Planning/Teaching Needs:  Pt to d/c home with wife who can provide 24/7 supervision.  Teaching is ongoing with wife who visits daily.   Team Discussion:  Watching BP;  BPH with retention.  Cont bowel;  Plan to d/c with foley and f/u with urology as an OP.  Currently min assist with therapies and supervision goals.  Poor problem solving and awareness of deficits per ST.  Pt and wife very focused on wanting pt to return to work ASAP - MD, [place] and neuropsychology to provide feedback and recommendations on this plan.  Revisions to Treatment Plan:  NA    Continued Need for Acute Rehabilitation Level of Care: The patient requires daily  medical management by a physician with specialized training in physical medicine and rehabilitation for the following conditions: Daily direction of a multidisciplinary physical rehabilitation program to ensure safe treatment while eliciting the highest outcome that is of practical value to the patient.: Yes Daily medical management of patient stability for increased activity during participation in an intensive rehabilitation regime.: Yes Daily analysis of laboratory values and/or radiology reports with any subsequent need for medication adjustment of medical intervention for : Neurological problems;Post surgical problems   I attest that I was present, lead the team conference, and concur with the assessment and plan of the team.   Jessyca Sloan 04/28/2019, 11:18 AM   Team conference was held via web/ teleconference due to [place] - 19

## 2019-04-27 ENCOUNTER — Inpatient Hospital Stay (HOSPITAL_COMMUNITY): Payer: Medicare Other | Admitting: Speech Pathology

## 2019-04-27 ENCOUNTER — Inpatient Hospital Stay (HOSPITAL_COMMUNITY): Payer: Medicare Other | Admitting: Occupational Therapy

## 2019-04-27 ENCOUNTER — Inpatient Hospital Stay (HOSPITAL_COMMUNITY): Payer: Medicare Other

## 2019-04-27 ENCOUNTER — Inpatient Hospital Stay (HOSPITAL_COMMUNITY): Payer: Medicare Other | Admitting: Physical Therapy

## 2019-04-27 LAB — BASIC METABOLIC PANEL
Anion gap: 10 (ref 5–15)
BUN: 21 mg/dL (ref 8–23)
CO2: 27 mmol/L (ref 22–32)
Calcium: 8.7 mg/dL — ABNORMAL LOW (ref 8.9–10.3)
Chloride: 100 mmol/L (ref 98–111)
Creatinine, Ser: 1.34 mg/dL — ABNORMAL HIGH (ref 0.61–1.24)
GFR calc Af Amer: 56 mL/min — ABNORMAL LOW (ref >60)
GFR calc non Af Amer: 48 mL/min — ABNORMAL LOW (ref >60)
Glucose, Bld: 91 mg/dL (ref 70–99)
Potassium: 3.9 mmol/L (ref 3.5–5.1)
Sodium: 137 mmol/L (ref 135–145)

## 2019-04-27 NOTE — Progress Notes (Signed)
Physical Therapy Session Note  Patient Details  Name: Benjamin Hahn MRN: [id number] Date of Birth: May 15, 1933  Today's Date: 04/27/2019 PT Individual Time: 0800-0908 PT Individual Time Calculation (min): 68 min   Short Term Goals: Week 1:  PT Short Term Goal 1 (Week 1): Pt will perform bed mobility with supervision assist. PT Short Term Goal 2 (Week 1): Pt will consistently perform stand pivot transfer with min assist and LRAD PT Short Term Goal 3 (Week 1): Pt will ambulate 152ft with min assist and LRAD  Skilled Therapeutic Interventions/Progress Updates:   Pt in recliner and agreeable to therapy, mild headache (premedicated) and denied further intervention. Total assist to don TEDs and min assist to don shoes. Pt requesting to toilet, ambulated to/from toilet w/ CGA using RW. LE garment management w/ CGA. CGA while standing at sink to wash hands. Ambulated to/from therapy gym w/ min assist, x150' each way. Continues to need frequent verbal and visual cues for gait pattern and to attend to task in distracting hospital environment. Worked on static standing balance and postural control while performing clothspin task on firm surface and then red foam wedge to facilitate gastroc stretch and increased relative anterior weight shifting. Min assist to stabilize in stance w/o UE support, increased time w/ wedge. CGA-min assist for balance in standing, verbal and tactile cues for anterior weight shifting, pt w/ posterior lean bias intermittently but improved since last session with this therapist. Pre-gait alternating 3" step taps to facilitate increased hip flexion, verbal and visual cues for wider stance and CGA for balance. Returned to room and ended session in recliner, all needs in reach.   Therapy Documentation Precautions:  Precautions Precautions: Fall Precaution Comments: Rt crani Restrictions Weight Bearing Restrictions: No Vital Signs: Therapy Vitals Temp: 98.3 F (36.8 C) Temp  Source: Oral Pulse Rate: 79 Resp: 16 BP: 124/66 Patient Position (if appropriate): Lying Oxygen Therapy SpO2: 98 % O2 Device: Room Air  Therapy/Group: Individual Therapy  Aspynn Clover K Neah Sporrer 04/27/2019, 9:10 AM

## 2019-04-27 NOTE — Progress Notes (Signed)
Physical Therapy Session Note  Patient Details  Name: Benjamin Hahn MRN: [id number] Date of Birth: [birth year]  Today's Date: 04/27/2019 PT Individual Time: 1115-1155 PT Individual Time Calculation (min): 40 min   Short Term Goals: Week 1:  PT Short Term Goal 1 (Week 1): Pt will perform bed mobility with supervision assist. PT Short Term Goal 2 (Week 1): Pt will consistently perform stand pivot transfer with min assist and LRAD PT Short Term Goal 3 (Week 1): Pt will ambulate 134ft with min assist and LRAD  Skilled Therapeutic Interventions/Progress Updates:    Session focused on sit <> stands (with emphasis on postural control and anterior weightshift due to posterior bias) with CGA, functional transfers with RW with CGA and verbal cues for safe technique and awareness to self positioning, NMR on Nustep x 9 min on level 5 with BUE and BLE for reciprocal movement pattern retraining and overall coordination and strengthening, and gait training with RW on unit x 150' x 2 with CGA overall (min assist during tight turning or as fatiguing due to increased shuffled gait pattern and flexed posture).  Wife present upon return to room and discussed and educated on overall progress and equipment needs at this time. She reports pt has a rollator at home. Plan to trial a rollator here but due to his shuffled and flexed positioning (letting RW take him for a walk at times), would need to see first before recommending it as safe AD. Pt and wife in agreement and aware that a RW can be ordered if that is recommended. Left with safety belt donned and wife at bedside with all needs in reach.   Therapy Documentation Precautions:  Precautions Precautions: Fall Precaution Comments: Rt crani Restrictions Weight Bearing Restrictions: No    Pain: Pain Assessment Pain Score: 0-No pain    Therapy/Group: Individual Therapy  [name], PT, DPT, CBIS  04/27/2019, 12:04 PM

## 2019-04-27 NOTE — Progress Notes (Signed)
Speech Language Pathology Daily Session Note  Patient Details  Name: Benjamin Hahn MRN: [id number] Date of Birth: November 19, 1932  Today's Date: 04/27/2019 SLP Individual Time: [id number] SLP Individual Time Calculation (min): 45 min  Short Term Goals: Week 1: SLP Short Term Goal 1 (Week 1): STG=LTG due to ELOS  Skilled Therapeutic Interventions: Skilled treatment session focused on cognitive goals. SLP facilitated session by providing education on strategies to maximize organization in order to facilitate carryover and recall of functional information. Patient verbalized understanding and selected a visual aid that he wanted to utilize throughout the day to record information. SLP also facilitated session by providing Mod A verbal cues for organization and Max A verbal cues for problem solving during a complex money management task. 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 Patient handed off to PT. Continue with current plan of care.      Pain Pain Assessment Pain Score: 0-No pain  Therapy/Group: Individual Therapy  Ricahrd Schwager 04/27/2019, 12:24 PM

## 2019-04-27 NOTE — Progress Notes (Signed)
Occupational Therapy Session Note  Patient Details  Name: Benjamin Hahn MRN: [id number] Date of Birth: Aug 22, 1933  Today's Date: 04/27/2019 OT Individual Time: 1300-1415 OT Individual Time Calculation (min): 75 min    Short Term Goals: Week 1:  OT Short Term Goal 1 (Week 1): Pt will complete toilet transfer with Min A sit<stand using LRAD OT Short Term Goal 2 (Week 1): Pt will complete LB dressing with supervision for dynamic standing balance OT Short Term Goal 3 (Week 1): Pt will complete shower transfer with Min A and LRAD  Skilled Therapeutic Interventions/Progress Updates:    Pt greeted seated in recliner with spouse present and agreeable to OT treatment session focused on self-care retraining. Pt ambulated into bathroom with RW and min A. Max verbal and tactile cues for RW management and to increase step length 2/2 shuffling gait pattern. Pt sat on shower seat and doffed clothing with min A. Bathing completed with overall min A. Min A stand-pivot out of shower. Pt's catheter anchor came off so OT replaced this. Educated pt on how to thread catheter through pant leg as he will likely dc home with the foley catheter. Pt's wife with questions regarding foley, so OT explained from chart review that plan is for pt to follow up with outpatient urology. Pt shaved at the sink with set-up A. Multiple sit<>stands throughout session with focus on power up and anterior weight shift. Stand-pivot back to recliner with min A. Pt left with chair alarm on, spouse present, and needs met.    Therapy Documentation Precautions:  Precautions Precautions: Fall Precaution Comments: Rt crani Restrictions Weight Bearing Restrictions: No  Pain: Denies pain  Therapy/Group: Individual Therapy  Valma Cava 04/27/2019, 2:18 PM

## 2019-04-27 NOTE — Progress Notes (Signed)
PRN xanax and 1 norco given at 2100, per patient's request. Slept good last night. Patrici Ranks A

## 2019-04-27 NOTE — Progress Notes (Signed)
[place] PHYSICAL MEDICINE & REHABILITATION PROGRESS NOTE   Subjective/Complaints:   States he had a good night. Asked when staples are coming out. Denies pain this morning  ROS: Patient denies fever, rash, sore throat, blurred vision, nausea, vomiting, diarrhea, cough, shortness of breath or chest pain, joint or back pain, headache, or mood change.   Objective:   No results found. No results for input(s): WBC, HGB, HCT, PLT in the last 72 hours. Recent Labs    04/27/19 0512  NA 137  K 3.9  CL 100  CO2 27  GLUCOSE 91  BUN 21  CREATININE 1.34*  CALCIUM 8.7*    Intake/Output Summary (Last 24 hours) at 04/27/2019 0919 Last data filed at 04/26/2019 2300 Gross per 24 hour  Intake 800 ml  Output 400 ml  Net 400 ml     Physical Exam: Vital Signs Blood pressure 124/66, pulse 79, temperature 98.3 F (36.8 C), temperature source Oral, resp. rate 16, height 5' 7.5" (1.715 m), weight 87.1 kg, SpO2 98 %.  Constitutional: No distress . Vital signs reviewed. HEENT: EOMI, oral membranes moist Neck: supple Cardiovascular: RRR without murmur. No JVD    Respiratory: CTA Bilaterally without wheezes or rales. Normal effort    GI: BS +, non-tender, non-distended   Genitourinary:  Indwelling Foley catheter tube in place--urine clear Musculoskeletal:  General: No deformityor edema.  Neurological: alert. Normal language. Follows commands. Fair insight. Mild left inattention. Moves all 4 limbs. Mild left pronator drift. Motor 4/5 all 4. No focal sensory findings. good standing balance. Slightly shuffling gait.  Skin: a few scattered bruises improving. Scalp wound CDI with staples.  Psychiatric: pleasantand cooperative   Assessment/Plan: 1. Functional deficits secondary to right SDH after fall which require 3+ hours per day of interdisciplinary therapy in a comprehensive inpatient rehab setting.  Physiatrist is providing close team supervision and 24 hour management of  active medical problems listed below.  Physiatrist and rehab team continue to assess barriers to discharge/monitor patient progress toward functional and medical goals  Care Tool:  Bathing    Body parts bathed by patient: Right arm, Left arm, Chest, Abdomen, Buttocks, Front perineal area, Right upper leg, Left upper leg, Right lower leg, Left lower leg, Face         Bathing assist Assist Level: Minimal Assistance - Patient > 75%     Upper Body Dressing/Undressing Upper body dressing   What is the patient wearing?: Pull over shirt, Button up shirt    Upper body assist Assist Level: Minimal Assistance - Patient > 75%    Lower Body Dressing/Undressing Lower body dressing      What is the patient wearing?: Pants, Underwear/pull up     Lower body assist Assist for lower body dressing: Total Assistance - Patient < 25%     Toileting Toileting    Toileting assist Assist for toileting: Moderate Assistance - Patient 50 - 74%     Transfers Chair/bed transfer  Transfers assist     Chair/bed transfer assist level: Contact Guard/Touching assist     Locomotion Ambulation   Ambulation assist      Assist level: Minimal Assistance - Patient > 75% Assistive device: Walker-rolling Max distance: 150'   Walk 10 feet activity   Assist     Assist level: Minimal Assistance - Patient > 75% Assistive device: Walker-rolling   Walk 50 feet activity   Assist    Assist level: Minimal Assistance - Patient > 75% Assistive device: Walker-rolling  Walk 150 feet activity   Assist Walk 150 feet activity did not occur: Safety/medical concerns  Assist level: Minimal Assistance - Patient > 75% Assistive device: Walker-rolling    Walk 10 feet on uneven surface  activity   Assist Walk 10 feet on uneven surfaces activity did not occur: Safety/medical concerns         Wheelchair     Assist Will patient use wheelchair at discharge?: Yes Type of Wheelchair:  Manual    Wheelchair assist level: Supervision/Verbal cueing Max wheelchair distance: 27'    Wheelchair 50 feet with 2 turns activity    Assist        Assist Level: Supervision/Verbal cueing   Wheelchair 150 feet activity     Assist  Wheelchair 150 feet activity did not occur: Safety/medical concerns   Assist Level: Supervision/Verbal cueing   Blood pressure 124/66, pulse 79, temperature 98.3 F (36.8 C), temperature source Oral, resp. rate 16, height 5' 7.5" (1.715 m), weight 87.1 kg, SpO2 98 %.  Medical Problem List and Plan: 1.Decreased functional mobility with left side weaknesssecondary to right hemispheric traumatic subacute and chronic subdural hematoma with multiple falls. Status post right frontoparietal temporal craniotomy evacuation of subdural hematoma 04/19/2019  -f/u CT Friday --Continue CIR therapies including PT, OT, and SLP   2. Antithrombotics: -DVT/anticoagulation:SCDs -antiplatelet therapy: N/A 3. Pain Management:Hydrocodone as needed 4. Mood:Tofranil 10 mg daily---changed to 2000 nightly per pt request  - Wellbutrin 150 mg daily  -Effexor 37.5 mg daily  - Xanax 0.5 mg nightly as needed -antipsychotic agents: N/A 5. Neuropsych: This patientiscapable of making decisions on hisown behalf. 6. Skin/Wound Care:Routine skin checks  -continue scalp staples until friday per NS 7. Fluids/Electrolytes/Nutrition:reasonable intake  -Cr stable at 1.34 (likely baseline)  -I personally reviewed the patient's labs today.   8. Hypertension. Toprol 12.5 mg daily, Altace 1.25 mg daily.   - Vitals:   04/26/19 2013 04/27/19 0528  BP: 100/61 124/66  Pulse: 67 79  Resp: 16 16  Temp: 98.7 F (37.1 C) 98.3 F (36.8 C)  SpO2: 96% 98%  improved control 9/3. No more excessive drops  -continue to push fluids  -held altace   -reduced flomax to 0.4mg  9. BPH. Noted urinary retention. 16 French coud catheter  placed. Patient followed by urology services Dr Gloriann Loan. -Plan is currently to keep Foley tube until follow up office. -Flomax reduced to 0.4 mg daily d/t hypotension 10. Hypothyroidism. Synthroid    LOS: 6 days A FACE TO FACE EVALUATION WAS PERFORMED  Meredith Staggers 04/27/2019, 9:19 AM

## 2019-04-27 NOTE — Plan of Care (Signed)
  Problem: RH BLADDER ELIMINATION Goal: RH STG MANAGE BLADDER WITH ASSISTANCE Description: STG Manage Bladder With max Assistance Outcome: Not Progressing; cath

## 2019-04-28 ENCOUNTER — Inpatient Hospital Stay (HOSPITAL_COMMUNITY): Payer: Medicare Other

## 2019-04-28 ENCOUNTER — Inpatient Hospital Stay (HOSPITAL_COMMUNITY): Payer: Medicare Other | Admitting: Occupational Therapy

## 2019-04-28 ENCOUNTER — Inpatient Hospital Stay (HOSPITAL_COMMUNITY): Payer: Medicare Other | Admitting: Speech Pathology

## 2019-04-28 ENCOUNTER — Inpatient Hospital Stay (HOSPITAL_COMMUNITY): Payer: Medicare Other | Admitting: Physical Therapy

## 2019-04-28 MED ORDER — CHLORHEXIDINE GLUCONATE CLOTH 2 % EX PADS
6.0000 | MEDICATED_PAD | Freq: Every day | CUTANEOUS | Status: DC
  Administered 2019-04-28 – 2019-05-03 (×6): 6 via TOPICAL

## 2019-04-28 NOTE — Plan of Care (Signed)
  Problem: RH Problem Solving Goal: LTG Patient will demonstrate problem solving for (SLP) Description: LTG:  Patient will demonstrate problem solving for basic/complex daily situations with cues  (SLP) Flowsheets (Taken 04/28/2019 [phone]) LTG Patient will demonstrate problem solving for: Supervision Note: Downgraded 04/28/19   Problem: RH Memory Goal: LTG Patient will use memory compensatory aids to (SLP) Description: LTG:  Patient will use memory compensatory aids to recall biographical/new, daily complex information with cues (SLP) Flowsheets (Taken 04/28/2019 [id number]) LTG: Patient will use memory compensatory aids to (SLP): Supervision Note: Downgraded 04/28/19

## 2019-04-28 NOTE — Progress Notes (Signed)
Physical Therapy Session Note  Patient Details  Name: Benjamin Hahn MRN: [id number] Date of Birth: [birth year]  Today's Date: 04/28/2019 PT Individual Time: 1435-1530 PT Individual Time Calculation (min): 55 min   Short Term Goals: Week 1:  PT Short Term Goal 1 (Week 1): Pt will perform bed mobility with supervision assist. PT Short Term Goal 2 (Week 1): Pt will consistently perform stand pivot transfer with min assist and LRAD PT Short Term Goal 3 (Week 1): Pt will ambulate 151ft with min assist and LRAD  Skilled Therapeutic Interventions/Progress Updates:   Pt in recliner and agreeable to therapy, no c/o pain. Ambulated around unit w/ CGA-min assist w/ RW in 100-150' bouts. Pt w/ improved B foot clearance and increased step length today. NuStep 10 min @ level 3 for global strengthening, reciprocal movement pattern, and to work on large amplitude movements. Improved ability to reach larger amplitudes on NuStep, min verbal cues to attend to task. Transitioned to gait training w/ rollator. Ambulated 100' and 200' back to room w/ CGA-min assist. Pt w/ improved gait speed, and mild improvements in step length and foot clearance. However pt more impulsive w/ rollator and globally not as safe and controlled. Will continue to work towards gait w/ LRAD. Returned to room and ended session in recliner, all needs in reach.   Therapy Documentation Precautions:  Precautions Precautions: Fall Precaution Comments: Rt crani Restrictions Weight Bearing Restrictions: No  Therapy/Group: Individual Therapy  [name] [name] 04/28/2019, 3:49 PM

## 2019-04-28 NOTE — Progress Notes (Signed)
Subjective: Patient sitting up eating breakfast.  Denies complaints.  Reports good progress with therapies.  CT requested for 0500 this morning not performed.  Objective: Vital signs in last 24 hours: Vitals:   04/27/19 0528 04/27/19 1432 04/27/19 2043 04/28/19 0517  BP: 124/66 104/61 140/61 107/69  Pulse: 79 69 71 67  Resp: 16  16 16   Temp: 98.3 F (36.8 C) (!) 97.5 F (36.4 C) (!) 97.5 F (36.4 C) 98 F (36.7 C)  TempSrc: Oral  Oral Oral  SpO2: 98% 99% 98% 96%  Weight:      Height:        Intake/Output from previous day: 09/03 0701 - 09/04 0700 In: 920 [P.O.:920] Out: 1625 [Urine:1625] Intake/Output this shift: No intake/output data recorded.  Physical Exam: Awake and alert, oriented.  Following commands.  Moving all 4 extremities well.  Incision clean and dry, well-healed.  No erythema, ecchymosis, swelling, or drainage.  BMET Recent Labs    04/27/19 0512  NA 137  K 3.9  CL 100  CO2 27  GLUCOSE 91  BUN 21  CREATININE 1.34*  CALCIUM 8.7*    Assessment/Plan: Doing well from neurosurgical perspective.  Previously ordered surgical staples to be removed this morning, pending.  CT requested for 0500 this morning not yet performed.  Case discussed with rehab team including MD and PA.  Hosie Spangle, MD 04/28/2019, 8:09 AM

## 2019-04-28 NOTE — Progress Notes (Signed)
[facility] PHYSICAL MEDICINE & REHABILITATION PROGRESS NOTE   Subjective/Complaints:   Pt up with therapy. Asked when staples were coming out this morning!  ROS: Patient denies fever, rash, sore throat, blurred vision, nausea, vomiting, diarrhea, cough, shortness of breath or chest pain, joint or back pain, headache, or mood change.    Objective:   No results found. No results for input(s): WBC, HGB, HCT, PLT in the last 72 hours. Recent Labs    04/27/19 0512  NA 137  K 3.9  CL 100  CO2 27  GLUCOSE 91  BUN 21  CREATININE 1.34*  CALCIUM 8.7*    Intake/Output Summary (Last 24 hours) at 04/28/2019 1029 Last data filed at 04/28/2019 0800 Gross per 24 hour  Intake 560 ml  Output 1875 ml  Net -1315 ml     Physical Exam: Vital Signs Blood pressure 107/69, pulse 67, temperature 98 F (36.7 C), temperature source Oral, resp. rate 16, height 5' 7.5" (1.715 m), weight 87.1 kg, SpO2 96 %.  Constitutional: No distress . Vital signs reviewed. HEENT: EOMI, oral membranes moist Neck: supple Cardiovascular: RRR without murmur. No JVD    Respiratory: CTA Bilaterally without wheezes or rales. Normal effort    GI: BS +, non-tender, non-distended    Genitourinary:  Indwelling Foley catheter tube in place--urine remains clear Musculoskeletal:  General: No deformityor edema.  Neurological: alert. Normal language. Follows commands. Fair insight. Mild left inattention. Moves all 4 limbs. Mild left pronator drift. Motor 4/5 all 4. No focal sensory findings. fair standing balance.  Skin: a few scattered bruises improving. Scalp wound remains CDI with staples.  Psychiatric: pleasantand cooperative   Assessment/Plan: 1. Functional deficits secondary to right SDH after fall which require 3+ hours per day of interdisciplinary therapy in a comprehensive inpatient rehab setting.  Physiatrist is providing close team supervision and 24 hour management of active medical problems  listed below.  Physiatrist and rehab team continue to assess barriers to discharge/monitor patient progress toward functional and medical goals  Care Tool:  Bathing    Body parts bathed by patient: Right arm, Left arm, Chest, Abdomen, Buttocks, Front perineal area, Right upper leg, Left upper leg, Right lower leg, Left lower leg, Face         Bathing assist Assist Level: Minimal Assistance - Patient > 75%     Upper Body Dressing/Undressing Upper body dressing   What is the patient wearing?: Pull over shirt    Upper body assist Assist Level: Contact Guard/Touching assist    Lower Body Dressing/Undressing Lower body dressing      What is the patient wearing?: Underwear/pull up, Pants     Lower body assist Assist for lower body dressing: Minimal Assistance - Patient > 75%     Toileting Toileting    Toileting assist Assist for toileting: Contact Guard/Touching assist     Transfers Chair/bed transfer  Transfers assist     Chair/bed transfer assist level: Contact Guard/Touching assist     Locomotion Ambulation   Ambulation assist      Assist level: Contact Guard/Touching assist Assistive device: Walker-rolling Max distance: 150'   Walk 10 feet activity   Assist     Assist level: Contact Guard/Touching assist Assistive device: Walker-rolling   Walk 50 feet activity   Assist    Assist level: Contact Guard/Touching assist Assistive device: Walker-rolling    Walk 150 feet activity   Assist Walk 150 feet activity did not occur: Safety/medical concerns  Assist level: Minimal Assistance -  Patient > 75% Assistive device: Walker-rolling    Walk 10 feet on uneven surface  activity   Assist Walk 10 feet on uneven surfaces activity did not occur: Safety/medical concerns         Wheelchair     Assist Will patient use wheelchair at discharge?: Yes Type of Wheelchair: Manual    Wheelchair assist level: Supervision/Verbal cueing Max  wheelchair distance: 60'    Wheelchair 50 feet with 2 turns activity    Assist        Assist Level: Supervision/Verbal cueing   Wheelchair 150 feet activity     Assist  Wheelchair 150 feet activity did not occur: Safety/medical concerns   Assist Level: Supervision/Verbal cueing   Blood pressure 107/69, pulse 67, temperature 98 F (36.7 C), temperature source Oral, resp. rate 16, height 5' 7.5" (1.715 m), weight 87.1 kg, SpO2 96 %.  Medical Problem List and Plan: 1.Decreased functional mobility with left side weaknesssecondary to right hemispheric traumatic subacute and chronic subdural hematoma with multiple falls. Status post right frontoparietal temporal craniotomy evacuation of subdural hematoma 04/19/2019  -f/u CT Friday reviewed and appears stable to improved with expected evolution of blood products. official reading is pending.  --Continue CIR therapies including PT, OT, and SLP   2. Antithrombotics: -DVT/anticoagulation:SCDs -antiplatelet therapy: N/A 3. Pain Management:Hydrocodone as needed 4. Mood:Tofranil 10 mg daily---changed to 2000 nightly per pt request  - Wellbutrin 150 mg daily  -Effexor 37.5 mg daily  - Xanax 0.5 mg nightly as needed -antipsychotic agents: N/A 5. Neuropsych: This patientiscapable of making decisions on hisown behalf. 6. Skin/Wound Care:Routine skin checks  -continue scalp staples until friday per NS 7. Fluids/Electrolytes/Nutrition:reasonable intake  -Cr stable at 1.34 (likely baseline)   -can recheck next week.   8. Hypertension. Toprol 12.5 mg daily, Altace 1.25 mg daily.   - Vitals:   04/27/19 2043 04/28/19 0517  BP: 140/61 107/69  Pulse: 71 67  Resp: 16 16  Temp: (!) 97.5 F (36.4 C) 98 F (36.7 C)  SpO2: 98% 96%  improved control 9/4. No more excessive bp drops  -continue to push fluids  -held altace   -reduced flomax to 0.4mg  9. BPH. Noted urinary retention. 16  French coud catheter placed. Patient followed by urology services Dr Gloriann Loan. -Plan is currently to keep Foley tube until follow up office. -Flomax reduced to 0.4 mg daily d/t hypotension 10. Hypothyroidism. Synthroid    LOS: 7 days A FACE TO [place] 04/28/2019, 10:29 AM

## 2019-04-28 NOTE — Progress Notes (Signed)
Occupational Therapy Session Note  Patient Details  Name: Benjamin Hahn MRN: [id number] Date of Birth: [birth year]  Today's Date: 04/28/2019 OT Individual Time: [id number] OT Individual Time Calculation (min): 71 min    Short Term Goals: Week 1:  OT Short Term Goal 1 (Week 1): Pt will complete toilet transfer with Min A sit<stand using LRAD OT Short Term Goal 2 (Week 1): Pt will complete LB dressing with supervision for dynamic standing balance OT Short Term Goal 3 (Week 1): Pt will complete shower transfer with Min A and LRAD  Skilled Therapeutic Interventions/Progress Updates:    Pt greeted sitting in recliner finishing breakfast and agreeable to OT treatment session. Discussed goals for therapy session today, pt progress, and dc date. Pt declined to shower this morning 2/2 showering yesterday, but wanted to get dressed. Pt reported need to go to the bathroom. Pt ambulated with RW into bathroom with CGA and cues for RW positioning. Pt with small BM and completed peri-care with CGA for balance. Worked on pt threading catheter again today with pt needing min A overall for LB dressing. Had pt practice donning TED hose using friction reducing device. Standing balance/endurance with standing toothbrushing task. CGA and 2 trials for sit<>stand and facilitation for anterior weight shift. Pt brought to therapy gym in [place] for time management. Pt completed 9 hole peg test.  R:39 L: 125  Educated pt on coordination deficits on L side. Pt returned to room and completed stand-pivot back to recliner with CGA. PT left with chair alarm on and nurse present.   Therapy Documentation Precautions:  Precautions Precautions: Fall Precaution Comments: Rt crani Restrictions Weight Bearing Restrictions: No Pain:  denies pain   Therapy/Group: Individual Therapy  Valma Cava 04/28/2019, 7:46 AM

## 2019-04-28 NOTE — Progress Notes (Signed)
Speech Language Pathology Weekly Progress and Session Note  Patient Details  Name: Benjamin Hahn MRN: [id number] Date of Birth: June 15, 1933  Beginning of progress report period: April 22, 2019 End of progress report period: April 28, 2019  Today's Date: 04/28/2019 SLP Individual Time: 1020-1100 SLP Individual Time Calculation (min): 40 min and Today's Date: 04/28/2019 SLP Missed Time: 20 Minutes Missed Time Reason: CT/MRI  Short Term Goals: Week 1: SLP Short Term Goal 1 (Week 1): STG=LTG due to ELOS SLP Short Term Goal 1 - Progress (Week 1): Not met    New Short Term Goals: Week 2: SLP Short Term Goal 1 (Week 2): STG=LTG due to ELOS  Weekly Progress Updates: Patient has not met any STG's this reporting period as he continues to require overall Min-Mod A verbal cues for complex problem solving and Min A verbal and visual cues for recall of functional information with use of external aids. Patient and family education ongoing. Patient would benefit from continued skilled SLP intervention to maximize his cognitive functioning and overall functional independence prior to discharge.    Intensity: Minumum of 1-2 x/day, 30 to 90 minutes Frequency: 3 to 5 out of 7 days Duration/Length of Stay: 05/06/19 Treatment/Interventions: Cueing hierarchy;Cognitive remediation/compensation;Functional tasks;Internal/external aids;Patient/family education;Environmental controls;Therapeutic Activities   Daily Session  Skilled Therapeutic Interventions: Skilled treatment session focused on cognitive goals. Patient had just returned from CT and was asking clinician questions in regards to how do brain injury occurs and specific deficits in relation to the brain. SLP provided both verbal and visual education with use of the TBI education handbook. Patient verbalized understanding and independently took notes for recall and carryover. Patient continues to demonstrate increased anticipatory awareness in  regards to his current deficits and how they will impact his overall function and independence at discharge. Patient left upright in recliner with all needs within reach. Continue with current plan of care.     Pain No/Denies Pain   Therapy/Group: Individual Therapy  Benjamin Hahn 04/28/2019, 6:31 AM

## 2019-04-28 NOTE — Plan of Care (Signed)
  Problem: Consults Goal: RH BRAIN INJURY PATIENT EDUCATION Description: Description: See Patient Education module for eduction specifics Outcome: Progressing Goal: Skin Care Protocol Initiated - if Braden Score 18 or less Description: If consults are not indicated, leave blank or document N/A Outcome: Progressing   Problem: RH BLADDER ELIMINATION Goal: RH STG MANAGE BLADDER WITH ASSISTANCE Description: STG Manage Bladder With cues and reminders Assistance Outcome: Progressing Goal: RH STG MANAGE BLADDER WITH EQUIPMENT WITH ASSISTANCE Description: STG Manage Bladder With Equipment With cues and reminder assist Outcome: Progressing   Problem: RH SAFETY Goal: RH STG ADHERE TO SAFETY PRECAUTIONS W/ASSISTANCE/DEVICE Description: STG Adhere to Safety Precautions With supervision Assistance/Device. Outcome: Progressing   Problem: RH COGNITION-NURSING Goal: RH STG ANTICIPATES NEEDS/CALLS FOR ASSIST W/ASSIST/CUES Description: STG Anticipates Needs/Calls for Assist With mod I Assistance/Cues. Outcome: Progressing   Problem: RH PAIN MANAGEMENT Goal: RH STG PAIN MANAGED AT OR BELOW PT'S PAIN GOAL Description: < 3 Outcome: Progressing   Problem: RH KNOWLEDGE DEFICIT BRAIN INJURY Goal: RH STG INCREASE KNOWLEDGE OF SELF CARE AFTER BRAIN INJURY Description: Patient/caregiver will verbalize/demonstrate understanding of brain injury recovery as directed by care team with mod I assist. Outcome: Progressing

## 2019-04-29 ENCOUNTER — Inpatient Hospital Stay (HOSPITAL_COMMUNITY): Payer: Medicare Other

## 2019-04-29 DIAGNOSIS — S065X0D Traumatic subdural hemorrhage without loss of consciousness, subsequent encounter: Secondary | ICD-10-CM

## 2019-04-29 MED ORDER — LIDOCAINE 5 % EX PTCH
1.0000 | MEDICATED_PATCH | CUTANEOUS | Status: DC
  Administered 2019-04-29 – 2019-05-05 (×7): 1 via TRANSDERMAL
  Filled 2019-04-29 (×7): qty 1

## 2019-04-29 NOTE — Progress Notes (Signed)
Occupational Therapy Session Note  Patient Details  Name: Benjamin Hahn MRN: [id number] Date of Birth: 1932/11/23  Today's Date: 04/29/2019 OT Individual Time: 1015-1100 OT Individual Time Calculation (min): 45 min    Short Term Goals: Week 1:  OT Short Term Goal 1 (Week 1): Pt will complete toilet transfer with Min A sit<stand using LRAD OT Short Term Goal 2 (Week 1): Pt will complete LB dressing with supervision for dynamic standing balance OT Short Term Goal 3 (Week 1): Pt will complete shower transfer with Min A and LRAD  Skilled Therapeutic Interventions/Progress Updates:    1:1. Pt received in recliner with no pain reported requesting to shower. Pt ambulates with rolator with min VC and CGA for brake management and slow pace to select clothing from dresser and transfer to toilet and shower. Pt completes toileting with CGA and bathes with supervision using grab bar for standing balance. Pt dons shirt in standing with supervision and pants with A to thread foley and CGA for standing balance. Pt shaves seated at sink with set up. Exited session with pt seated in w/c, call light in reach and all needs met.   Therapy Documentation Precautions:  Precautions Precautions: Fall Precaution Comments: Rt crani Restrictions Weight Bearing Restrictions: No General:   Vital Signs:   Pain:   ADL: ADL Eating: Not assessed Grooming: Moderate assistance(shaving) Where Assessed-Grooming: Sitting at sink Upper Body Bathing: Supervision/safety Where Assessed-Upper Body Bathing: Sitting at sink Lower Body Bathing: Minimal assistance Where Assessed-Lower Body Bathing: Sitting at sink, Standing at sink Upper Body Dressing: Supervision/safety Where Assessed-Upper Body Dressing: Sitting at sink Lower Body Dressing: Moderate assistance Where Assessed-Lower Body Dressing: Sitting at sink, Standing at sink Toileting: Not assessed Toilet Transfer: Moderate assistance Toilet Transfer Method:  Counselling psychologist: Energy manager: Not assessed Vision   Perception    Praxis   Exercises:   Other Treatments:     Therapy/Group: Individual Therapy  Tonny Branch 04/29/2019, 10:53 AM

## 2019-04-29 NOTE — Plan of Care (Signed)
  Problem: RH BLADDER ELIMINATION Goal: RH STG MANAGE BLADDER WITH ASSISTANCE Description: STG Manage Bladder With cues and reminders Assistance Outcome: Not Progressing; foley cath

## 2019-04-29 NOTE — Progress Notes (Signed)
Physical Therapy Weekly Progress Note  Patient Details  Name: Benjamin Hahn MRN: [id number] Date of Birth: [birth year]  Beginning of progress report period: April 22, 2019 End of progress report period: April 29, 2019  Patient has met 3 of 3 short term goals. Pt is progressing well towards LTGs. He is performing all mobility w/ CGA-close supervision using RW. Gait 100-150' at a time. Pt prefers to use rollator as this is what he used PTA, have practiced using rollator, however he is less safe and requires min assist. Will continue to work towards gait w/ LRAD. He continues to ambulate w/ shuffled and flexed posture gait pattern, although improved.   Patient continues to demonstrate the following deficits muscle weakness, decreased cardiorespiratoy endurance, decreased coordination and decreased motor planning, decreased attention, decreased safety awareness and decreased memory and decreased standing balance, decreased postural control and decreased balance strategies and therefore will continue to benefit from skilled PT intervention to increase functional independence with mobility.  Patient progressing toward long term goals..  Continue plan of care.  PT Short Term Goals Week 1:  PT Short Term Goal 1 (Week 1): Pt will perform bed mobility with supervision assist PT Short Term Goal 1 - Progress (Week 1): Met PT Short Term Goal 2 (Week 1): Pt will consistently perform stand pivot transfer with min assist and LRAD PT Short Term Goal 2 - Progress (Week 1): Met PT Short Term Goal 3 (Week 1): Pt will ambulate 100' with min assist and LRAD PT Short Term Goal 3 - Progress (Week 1): Met Week 2:  PT Short Term Goal 1 (Week 2): =LTGs due to ELOS  [name] K [name] 04/29/2019, 4:17 PM

## 2019-04-29 NOTE — Progress Notes (Signed)
[facility] PHYSICAL MEDICINE & REHABILITATION PROGRESS NOTE   Subjective/Complaints:   Crick in neck / has associated headache- tylenol helped some but not completely.    ROS: Patient denies fever, rash, sore throat, blurred vision, nausea, vomiting, diarrhea, cough, shortness of breath or chest pain, joint or back pain, headache, or mood change.    Objective:   Ct Head Wo Contrast  Result Date: 04/28/2019 CLINICAL DATA:  Follow-up craniotomy for subdural hematoma EXAM: CT HEAD WITHOUT CONTRAST TECHNIQUE: Contiguous axial images were obtained from the base of the skull through the vertex without intravenous contrast. COMPARISON:  CT 04/21/2019, MRI 04/17/2019 FINDINGS: Brain: Right subdural drain has been removed since the prior CT. Mixed density subdural fluid collection on the left shows progression since the prior CT of 04/21/2019. Low-density fluid anteriorly in the right frontal lobe measures 10 mm in thickness which is similar in size to the prior study. Right frontal parietal convexity subdural fluid is mixed density and measures approximately 6 mm unchanged from the prior study. Decreased subdural gas since the prior study. Moderate ventricular enlargement unchanged. No acute infarct.  No new hemorrhage.  No midline shift. Vascular: Negative for hyperdense vessel Skull: Right frontal craniotomy.  Craniotomy flap in good position. Sinuses/Orbits: Mucoperiosteal thickening right maxillary sinus. Air-fluid level right maxillary sinus. Other: None IMPRESSION: Postop right craniotomy for subdural hematoma drainage. Residual subdural fluid on the right shows progressive decrease in density and is similar in size to the prior CT Moderate ventricular enlargement stable. No new hemorrhage. Electronically Signed   By: Franchot Gallo M.D.   On: 04/28/2019 13:12   No results for input(s): WBC, HGB, HCT, PLT in the last 72 hours. Recent Labs    04/27/19 0512  NA 137  K 3.9  CL 100  CO2 27   GLUCOSE 91  BUN 21  CREATININE 1.34*  CALCIUM 8.7*    Intake/Output Summary (Last 24 hours) at 04/29/2019 1144 Last data filed at 04/29/2019 0834 Gross per 24 hour  Intake 900 ml  Output 1300 ml  Net -400 ml     Physical Exam: Vital Signs Blood pressure 138/71, pulse 68, temperature 97.8 F (36.6 C), temperature source Oral, resp. rate 16, height 5' 7.5" (1.715 m), weight 87.1 kg, SpO2 99 %.  Constitutional: No distress . Vital signs reviewed. Awake, alert, in chair at bedside, staples out, NAD HEENT: EOMI, oral membranes moist Neck: supple Cardiovascular: RRR without murmur. No JVD    Respiratory: CTA Bilaterally without wheezes or rales. Normal effort    GI: BS +, non-tender, non-distended    Genitourinary:  Indwelling Foley catheter tube in place--urine remains clear Musculoskeletal:   - very tight splenius capitus B/L L>R General: No deformityor edema.  Neurological: alert. Normal language. Follows commands. Fair insight. Mild left inattention. Moves all 4 limbs. Mild left pronator drift. Motor 4/5 all 4. No focal sensory findings. fair standing balance.  Skin: a few scattered bruises improving. Scalp wound remains CDI with staples.  Psychiatric: pleasantand cooperative   Assessment/Plan: 1. Functional deficits secondary to right SDH after fall which require 3+ hours per day of interdisciplinary therapy in a comprehensive inpatient rehab setting.  Physiatrist is providing close team supervision and 24 hour management of active medical problems listed below.  Physiatrist and rehab team continue to assess barriers to discharge/monitor patient progress toward functional and medical goals  Care Tool:  Bathing    Body parts bathed by patient: Right arm, Left arm, Chest, Abdomen, Buttocks, Front perineal area,  Right upper leg, Left upper leg, Right lower leg, Left lower leg, Face         Bathing assist Assist Level: Minimal Assistance - Patient > 75%      Upper Body Dressing/Undressing Upper body dressing   What is the patient wearing?: Pull over shirt    Upper body assist Assist Level: Contact Guard/Touching assist    Lower Body Dressing/Undressing Lower body dressing      What is the patient wearing?: Underwear/pull up, Pants     Lower body assist Assist for lower body dressing: Minimal Assistance - Patient > 75%     Toileting Toileting    Toileting assist Assist for toileting: Contact Guard/Touching assist     Transfers Chair/bed transfer  Transfers assist     Chair/bed transfer assist level: Contact Guard/Touching assist     Locomotion Ambulation   Ambulation assist      Assist level: Minimal Assistance - Patient > 75% Assistive device: Rollator Max distance: 150'   Walk 10 feet activity   Assist     Assist level: Minimal Assistance - Patient > 75% Assistive device: Rollator   Walk 50 feet activity   Assist    Assist level: Minimal Assistance - Patient > 75% Assistive device: Rollator    Walk 150 feet activity   Assist Walk 150 feet activity did not occur: Safety/medical concerns  Assist level: Minimal Assistance - Patient > 75% Assistive device: Rollator    Walk 10 feet on uneven surface  activity   Assist Walk 10 feet on uneven surfaces activity did not occur: Safety/medical concerns         Wheelchair     Assist Will patient use wheelchair at discharge?: Yes Type of Wheelchair: Manual    Wheelchair assist level: Supervision/Verbal cueing Max wheelchair distance: 39'    Wheelchair 50 feet with 2 turns activity    Assist        Assist Level: Supervision/Verbal cueing   Wheelchair 150 feet activity     Assist  Wheelchair 150 feet activity did not occur: Safety/medical concerns   Assist Level: Supervision/Verbal cueing   Blood pressure 138/71, pulse 68, temperature 97.8 F (36.6 C), temperature source Oral, resp. rate 16, height 5' 7.5" (1.715 m),  weight 87.1 kg, SpO2 99 %.  Medical Problem List and Plan: 1.Decreased functional mobility with left side weaknesssecondary to right hemispheric traumatic subacute and chronic subdural hematoma with multiple falls. Status post right frontoparietal temporal craniotomy evacuation of subdural hematoma 04/19/2019  -f/u CT Friday reviewed and appears stable to improved with expected evolution of blood products. official reading is pending.  --Continue CIR therapies including PT, OT, and SLP   2. Antithrombotics: -DVT/anticoagulation:SCDs -antiplatelet therapy: N/A 3. Pain Management:Hydrocodone as needed 4. Mood:Tofranil 10 mg daily---changed to 2000 nightly per pt request  - Wellbutrin 150 mg daily  -Effexor 37.5 mg daily  - Xanax 0.5 mg nightly as needed -antipsychotic agents: N/A 5. Neuropsych: This patientiscapable of making decisions on hisown behalf. 6. Skin/Wound Care:Routine skin checks  -continue scalp staples until friday per NS 7. Fluids/Electrolytes/Nutrition:reasonable intake  -Cr stable at 1.34 (likely baseline)   -can recheck next week.   8. Hypertension. Toprol 12.5 mg daily, Altace 1.25 mg daily.   - Vitals:   04/28/19 1941 04/29/19 0421  BP: (!) 145/59 138/71  Pulse: 67 68  Resp: 18 16  Temp: 98.9 F (37.2 C) 97.8 F (36.6 C)  SpO2: 97% 99%  improved control 9/4. No more excessive bp  drops  -continue to push fluids  -held altace   -reduced flomax to 0.4mg  9. BPH. Noted urinary retention. 16 French coud catheter placed. Patient followed by urology services Dr Gloriann Loan. -Plan is currently to keep Foley tube until follow up office. -Flomax reduced to 0.4 mg daily d/t hypotension 10. Hypothyroidism. Synthroid  11. Myofascial pain  9/5- will start Lidocaine patch for neck tightness- 8pm to 8am    LOS: 8 days A FACE TO FACE EVALUATION WAS PERFORMED  Kaelan Emami 04/29/2019, 11:44 AM

## 2019-04-29 NOTE — Progress Notes (Signed)
Physical Therapy Session Note  Patient Details  Name: Benjamin Hahn MRN: [id number] Date of Birth: 04-Mar-1933  Today's Date: 04/29/2019 PT Individual Time: 1525-1540 PT Individual Time Calculation (min): 15 min   Short Term Goals: Week 1:  PT Short Term Goal 1 (Week 1): Pt will perform bed mobility with supervision assist. PT Short Term Goal 2 (Week 1): Pt will consistently perform stand pivot transfer with min assist and LRAD PT Short Term Goal 3 (Week 1): Pt will ambulate 11ft with min assist and LRAD  Skilled Therapeutic Interventions/Progress Updates:   Pt in recliner and agreeable to therapy, no c/o pain. Wife present throughout session to observe. Pt ambulated 125' x2 w/ rollator and CGA-min assist. Improved control over rollator this session compared to previous session, however impulsivity remains. Verbal and visual cues for increased foot clearance, step length, and upright posture throughout gait cycle. Educated wife and pt on benefits of each RW and rollator, and that therapy will make final recommendation prior to d/c next week. Pt's wife with many questions regarding d/c and prognosis. Wife stated "I did not expect that I would have to be home with him all the time" when reminded of importance of 24/7 supervision 2/2 cognition and safety concerns, especially with anything requiring dual task. However, multiple staff members have already spoken to both pt and wife about 24/7 supervision. Educated on realistic expectations of BI recovery including balance and cognitive deficits that are likely to remain long-term, especially as pt had balance deficits PTA. Both pt and wife understand his current deficits but have poor insight as to how these would make going back to work or being home alone unsafe for some time. Will continue to educate. Returned to room and ended session in recliner, all needs in reach.   Therapy Documentation Precautions:  Precautions Precautions:  Fall Precaution Comments: Rt crani Restrictions Weight Bearing Restrictions: No Vital Signs: Therapy Vitals Temp: 97.7 F (36.5 C) Temp Source: Oral Pulse Rate: 62 Resp: 19 BP: (!) 116/52 Patient Position (if appropriate): Sitting Oxygen Therapy SpO2: 99 % O2 Device: Room Air  Therapy/Group: Individual Therapy  Dori Devino K Arnulfo Batson 04/29/2019, 4:08 PM

## 2019-04-30 ENCOUNTER — Inpatient Hospital Stay (HOSPITAL_COMMUNITY): Payer: Medicare Other | Admitting: Occupational Therapy

## 2019-04-30 MED ORDER — DOCUSATE SODIUM 100 MG PO CAPS
100.0000 mg | ORAL_CAPSULE | Freq: Two times a day (BID) | ORAL | Status: DC
  Administered 2019-04-30 – 2019-05-01 (×3): 100 mg via ORAL
  Filled 2019-04-30 (×3): qty 1

## 2019-04-30 NOTE — Plan of Care (Signed)
  Problem: RH BLADDER ELIMINATION Goal: RH STG MANAGE BLADDER WITH ASSISTANCE Description: STG Manage Bladder With cues and reminders Assistance Outcome: Not Progressing; foley cath

## 2019-04-30 NOTE — Progress Notes (Signed)
[facility] PHYSICAL MEDICINE & REHABILITATION PROGRESS NOTE   Subjective/Complaints:   Alicia Amel in his neck is completely gone with lidocaine patch overnight- wants to keep it on- explained not able to- can only keep on for 12 hrs then off for 12 hrs- he voiced understanding and I took it off. Asking for Colace specifically- said stool is hard, but going.   ROS: Patient denies fever, rash, sore throat, blurred vision, nausea, vomiting, diarrhea, cough, shortness of breath or chest pain, joint or back pain, headache, or mood change.    Objective:   No results found. No results for input(s): WBC, HGB, HCT, PLT in the last 72 hours. No results for input(s): NA, K, CL, CO2, GLUCOSE, BUN, CREATININE, CALCIUM in the last 72 hours.  Intake/Output Summary (Last 24 hours) at 04/30/2019 1055 Last data filed at 04/30/2019 0724 Gross per 24 hour  Intake 1018 ml  Output 1200 ml  Net -182 ml     Physical Exam: Vital Signs Blood pressure 139/72, pulse 72, temperature 98.4 F (36.9 C), temperature source Oral, resp. rate 18, height 5' 7.5" (1.715 m), weight 87.1 kg, SpO2 100 %.  Constitutional: No distress . Vital signs reviewed. Awake, alert, in manual wheelchair at sink; shaving- a few red streaks on face from shaving/raw spots,  NAD HEENT: EOMI, oral membranes moist Neck: supple Cardiovascular: RRR without murmur.  Respiratory: CTA Bilaterally without wheezes or rales. Normal effort    GI: BS +, non-tender, non-distended    Genitourinary:  Indwelling Foley catheter tube in place--urine remains clear Musculoskeletal:   - splenius capitus and upper trap tightness improved B/L R>L General: No deformityor edema.  Neurological: alert. Normal language. Follows commands. Fair insight. Mild left inattention. Moves all 4 limbs. Mild left pronator drift. Motor 4/5 all 4. No focal sensory findings. fair standing balance.  Skin: a few scattered bruises improving. Scalp wound remains CDI with  staples.  Psychiatric: pleasantand cooperative   Assessment/Plan: 1. Functional deficits secondary to right SDH after fall which require 3+ hours per day of interdisciplinary therapy in a comprehensive inpatient rehab setting.  Physiatrist is providing close team supervision and 24 hour management of active medical problems listed below.  Physiatrist and rehab team continue to assess barriers to discharge/monitor patient progress toward functional and medical goals  Care Tool:  Bathing    Body parts bathed by patient: Right arm, Left arm, Chest, Abdomen, Buttocks, Front perineal area, Right upper leg, Left upper leg, Right lower leg, Left lower leg, Face         Bathing assist Assist Level: Minimal Assistance - Patient > 75%     Upper Body Dressing/Undressing Upper body dressing   What is the patient wearing?: Pull over shirt    Upper body assist Assist Level: Contact Guard/Touching assist    Lower Body Dressing/Undressing Lower body dressing      What is the patient wearing?: Underwear/pull up, Pants     Lower body assist Assist for lower body dressing: Minimal Assistance - Patient > 75%     Toileting Toileting    Toileting assist Assist for toileting: Contact Guard/Touching assist     Transfers Chair/bed transfer  Transfers assist     Chair/bed transfer assist level: Contact Guard/Touching assist     Locomotion Ambulation   Ambulation assist      Assist level: Minimal Assistance - Patient > 75% Assistive device: Rollator Max distance: 150'   Walk 10 feet activity   Assist     Assist  level: Minimal Assistance - Patient > 75% Assistive device: Rollator   Walk 50 feet activity   Assist    Assist level: Minimal Assistance - Patient > 75% Assistive device: Rollator    Walk 150 feet activity   Assist Walk 150 feet activity did not occur: Safety/medical concerns  Assist level: Minimal Assistance - Patient > 75% Assistive device:  Rollator    Walk 10 feet on uneven surface  activity   Assist Walk 10 feet on uneven surfaces activity did not occur: Safety/medical concerns         Wheelchair     Assist Will patient use wheelchair at discharge?: Yes Type of Wheelchair: Manual    Wheelchair assist level: Supervision/Verbal cueing Max wheelchair distance: 42'    Wheelchair 50 feet with 2 turns activity    Assist        Assist Level: Supervision/Verbal cueing   Wheelchair 150 feet activity     Assist  Wheelchair 150 feet activity did not occur: Safety/medical concerns   Assist Level: Supervision/Verbal cueing   Blood pressure 139/72, pulse 72, temperature 98.4 F (36.9 C), temperature source Oral, resp. rate 18, height 5' 7.5" (1.715 m), weight 87.1 kg, SpO2 100 %.  Medical Problem List and Plan: 1.Decreased functional mobility with left side weaknesssecondary to right hemispheric traumatic subacute and chronic subdural hematoma with multiple falls. Status post right frontoparietal temporal craniotomy evacuation of subdural hematoma 04/19/2019  -f/u CT Friday reviewed and appears stable to improved with expected evolution of blood products. official reading is pending.  --Continue CIR therapies including PT, OT, and SLP   2. Antithrombotics: -DVT/anticoagulation:SCDs -antiplatelet therapy: N/A 3. Pain Management:Hydrocodone as needed 4. Mood:Tofranil 10 mg daily---changed to 2000 nightly per pt request  - Wellbutrin 150 mg daily  -Effexor 37.5 mg daily  - Xanax 0.5 mg nightly as needed -antipsychotic agents: N/A 5. Neuropsych: This patientiscapable of making decisions on hisown behalf. 6. Skin/Wound Care:Routine skin checks  -continue scalp staples until friday per NS 7. Fluids/Electrolytes/Nutrition:reasonable intake  -Cr stable at 1.34 (likely baseline)   -can recheck next week.   8. Hypertension. Toprol 12.5 mg daily, Altace  1.25 mg daily.   - Vitals:   04/29/19 2007 04/30/19 0432  BP:  139/72  Pulse:  72  Resp:  18  Temp: 98 F (36.7 C) 98.4 F (36.9 C)  SpO2:  100%  improved control 9/4. No more excessive bp drops  -continue to push fluids  -held altace   -reduced flomax to 0.4mg  9. BPH. Noted urinary retention. 16 French coud catheter placed. Patient followed by urology services Dr Gloriann Loan. -Plan is currently to keep Foley tube until follow up office. -Flomax reduced to 0.4 mg daily d/t hypotension 10. Hypothyroidism. Synthroid  11. Myofascial pain  9/5- will start Lidocaine patch for neck tightness- 8pm to 8am   9/6- Improved- will con't lidocaine patch at night if wants 12. Constipation-  9/6- adding Colace 1 tab BID  LOS: 9 days A FACE TO FACE EVALUATION WAS PERFORMED  Benjamin Hahn 04/30/2019, 10:55 AM

## 2019-04-30 NOTE — Progress Notes (Signed)
Occupational Therapy Session Note  Patient Details  Name: MARQUETTE BLODGETT MRN: [id number] Date of Birth: May 05, 1933  Today's Date: 04/30/2019 OT Individual Time: 1300-1400 OT Individual Time Calculation (min): 60 min   Short Term Goals: Week 1:  OT Short Term Goal 1 (Week 1): Pt will complete toilet transfer with Min A sit<stand using LRAD OT Short Term Goal 2 (Week 1): Pt will complete LB dressing with supervision for dynamic standing balance OT Short Term Goal 3 (Week 1): Pt will complete shower transfer with Min A and LRAD  Skilled Therapeutic Interventions/Progress Updates:    Pt greeted in recliner and eager for therapy. ADL needs met. Noted that he had his shorts donned without threading his foley. Discussed needing to thread catheter tube to avoid kinking of tube. Pt doffed LB garments and then threaded foley through underwear and pants with instruction. He wanted to focus on ambulating with rollator during session. Worked on increasing safety and safety awareness while using device. Pt ambulated throughout unit using rollator with steady-Min A. Vcs for forward gaze, upright alignment, and taking large steps. Pt with small shuffling steps, but there were windows of time where he displayed even strides. He was easily distracted by people or items in the environment. Vcs required for redirection and DME safety. Vcs also for proper hand placement during sit<stand transitions and scooting forward before sit<stand. Pt required Min A for power up from armless chair and Mod A from couch due to posterior bias/LOBs. To work on [employer], had him enter 3 different rooms and remember 3 items from each. Min vcs for pathfinding his way back to room, where spouse was present. He recalled 2/3 rooms + specified items on his own. Pt remained in recliner with all needs within reach and safety belt fastened.   Therapy Documentation Precautions:  Precautions Precautions: Fall Precaution Comments: Rt  crani Restrictions Weight Bearing Restrictions: No Vital Signs: Therapy Vitals Temp: 98.6 F (37 C) Temp Source: Oral Pulse Rate: 72 BP: 127/62 Patient Position (if appropriate): Sitting Oxygen Therapy SpO2: 90 % O2 Device: Room Air Pain: HA pain. Notified RN at end of session to provide pain medicine, per pt request Pain Assessment Pain Scale: 0-10 Pain Score: 0-No pain Pain Type: Acute pain Pain Location: Head Pain Descriptors / Indicators: Aching Pain Frequency: Intermittent Pain Onset: On-going Pain Intervention(s): Medication (See eMAR) ADL: ADL Eating: Not assessed Grooming: Moderate assistance(shaving) Where Assessed-Grooming: Sitting at sink Upper Body Bathing: Supervision/safety Where Assessed-Upper Body Bathing: Sitting at sink Lower Body Bathing: Minimal assistance Where Assessed-Lower Body Bathing: Sitting at sink, Standing at sink Upper Body Dressing: Supervision/safety Where Assessed-Upper Body Dressing: Sitting at sink Lower Body Dressing: Moderate assistance Where Assessed-Lower Body Dressing: Sitting at sink, Standing at sink Toileting: Not assessed Toilet Transfer: Moderate assistance Toilet Transfer Method: Counselling psychologist: Energy manager: Not assessed      Therapy/Group: Individual Therapy  Kaidan Spengler A Justn Quale 04/30/2019, 4:52 PM

## 2019-05-01 ENCOUNTER — Inpatient Hospital Stay (HOSPITAL_COMMUNITY): Payer: Medicare Other

## 2019-05-01 ENCOUNTER — Inpatient Hospital Stay (HOSPITAL_COMMUNITY): Payer: Medicare Other | Admitting: Occupational Therapy

## 2019-05-01 ENCOUNTER — Inpatient Hospital Stay (HOSPITAL_COMMUNITY): Payer: Medicare Other | Admitting: Speech Pathology

## 2019-05-01 LAB — CBC WITH DIFFERENTIAL/PLATELET
Abs Immature Granulocytes: 0.12 10*3/uL — ABNORMAL HIGH (ref 0.00–0.07)
Basophils Absolute: 0 10*3/uL (ref 0.0–0.1)
Basophils Relative: 0 %
Eosinophils Absolute: 0.3 10*3/uL (ref 0.0–0.5)
Eosinophils Relative: 3 %
HCT: 31.8 % — ABNORMAL LOW (ref 39.0–52.0)
Hemoglobin: 10.3 g/dL — ABNORMAL LOW (ref 13.0–17.0)
Immature Granulocytes: 2 %
Lymphocytes Relative: 27 %
Lymphs Abs: 2 10*3/uL (ref 0.7–4.0)
MCH: 31.5 pg (ref 26.0–34.0)
MCHC: 32.4 g/dL (ref 30.0–36.0)
MCV: 97.2 fL (ref 80.0–100.0)
Monocytes Absolute: 0.7 10*3/uL (ref 0.1–1.0)
Monocytes Relative: 9 %
Neutro Abs: 4.2 10*3/uL (ref 1.7–7.7)
Neutrophils Relative %: 59 %
Platelets: 272 10*3/uL (ref 150–400)
RBC: 3.27 MIL/uL — ABNORMAL LOW (ref 4.22–5.81)
RDW: 12.4 % (ref 11.5–15.5)
WBC: 7.3 10*3/uL (ref 4.0–10.5)
nRBC: 0 % (ref 0.0–0.2)

## 2019-05-01 LAB — BASIC METABOLIC PANEL
Anion gap: 8 (ref 5–15)
BUN: 26 mg/dL — ABNORMAL HIGH (ref 8–23)
CO2: 29 mmol/L (ref 22–32)
Calcium: 8.9 mg/dL (ref 8.9–10.3)
Chloride: 101 mmol/L (ref 98–111)
Creatinine, Ser: 1.5 mg/dL — ABNORMAL HIGH (ref 0.61–1.24)
GFR calc Af Amer: 49 mL/min — ABNORMAL LOW (ref >60)
GFR calc non Af Amer: 42 mL/min — ABNORMAL LOW (ref >60)
Glucose, Bld: 97 mg/dL (ref 70–99)
Potassium: 4 mmol/L (ref 3.5–5.1)
Sodium: 138 mmol/L (ref 135–145)

## 2019-05-01 MED ORDER — POLYETHYLENE GLYCOL 3350 17 G PO PACK: 17.0000 g | PACK | Freq: Every day | ORAL | Status: DC | PRN

## 2019-05-01 MED ORDER — SENNOSIDES-DOCUSATE SODIUM 8.6-50 MG PO TABS
2.0000 | ORAL_TABLET | Freq: Every day | ORAL | Status: DC
  Administered 2019-05-01: 21:00:00 2 via ORAL
  Filled 2019-05-01 (×5): qty 2

## 2019-05-01 MED ORDER — BISACODYL 10 MG RE SUPP: 10.0000 mg | Freq: Every day | RECTAL | Status: DC | PRN

## 2019-05-01 NOTE — Progress Notes (Signed)
[facility] PHYSICAL MEDICINE & REHABILITATION PROGRESS NOTE   Subjective/Complaints:   No major problems over weekend. Constipated however  ROS: Patient denies fever, rash, sore throat, blurred vision, nausea, vomiting, diarrhea, cough, shortness of breath or chest pain, joint or back pain, headache, or mood change.     Objective:   No results found. Recent Labs    05/01/19 0523  WBC 7.3  HGB 10.3*  HCT 31.8*  PLT 272   Recent Labs    05/01/19 0523  NA 138  K 4.0  CL 101  CO2 29  GLUCOSE 97  BUN 26*  CREATININE 1.50*  CALCIUM 8.9    Intake/Output Summary (Last 24 hours) at 05/01/2019 0947 Last data filed at 05/01/2019 0855 Gross per 24 hour  Intake 684 ml  Output 900 ml  Net -216 ml     Physical Exam: Vital Signs Blood pressure 126/70, pulse 68, temperature 98.1 F (36.7 C), temperature source Oral, resp. rate 14, height 5' 7.5" (1.715 m), weight 87.1 kg, SpO2 97 %.  Constitutional: No distress . Vital signs reviewed. HEENT: EOMI, oral membranes moist Neck: supple Cardiovascular: RRR without murmur. No JVD    Respiratory: CTA Bilaterally without wheezes or rales. Normal effort    GI: BS +, non-tender, non-distended  Genitourinary:  Indwelling Foley catheter tube in place--urine remains clear Musculoskeletal:  General: No deformityor edema.  Neurological: alert. Normal language. Follows commands. Fair insight. Mild left inattention. Moves all 4 limbs. Mild left pronator drift. Motor 4/5 all 4. No focal sensory findings. improving  standing balance.  Skin: staples out without issues.  Psychiatric: pleasantand cooperative   Assessment/Plan: 1. Functional deficits secondary to right SDH after fall which require 3+ hours per day of interdisciplinary therapy in a comprehensive inpatient rehab setting.  Physiatrist is providing close team supervision and 24 hour management of active medical problems listed below.  Physiatrist and rehab team  continue to assess barriers to discharge/monitor patient progress toward functional and medical goals  Care Tool:  Bathing    Body parts bathed by patient: Right arm, Left arm, Chest, Abdomen, Buttocks, Front perineal area, Right upper leg, Left upper leg, Right lower leg, Left lower leg, Face         Bathing assist Assist Level: Minimal Assistance - Patient > 75%     Upper Body Dressing/Undressing Upper body dressing   What is the patient wearing?: Pull over shirt    Upper body assist Assist Level: Contact Guard/Touching assist    Lower Body Dressing/Undressing Lower body dressing      What is the patient wearing?: Underwear/pull up, Pants     Lower body assist Assist for lower body dressing: Minimal Assistance - Patient > 75%     Toileting Toileting    Toileting assist Assist for toileting: Supervision/Verbal cueing     Transfers Chair/bed transfer  Transfers assist     Chair/bed transfer assist level: Contact Guard/Touching assist     Locomotion Ambulation   Ambulation assist      Assist level: Minimal Assistance - Patient > 75% Assistive device: Rollator Max distance: 150'   Walk 10 feet activity   Assist     Assist level: Minimal Assistance - Patient > 75% Assistive device: Rollator   Walk 50 feet activity   Assist    Assist level: Minimal Assistance - Patient > 75% Assistive device: Rollator    Walk 150 feet activity   Assist Walk 150 feet activity did not occur: Safety/medical concerns  Assist  level: Minimal Assistance - Patient > 75% Assistive device: Rollator    Walk 10 feet on uneven surface  activity   Assist Walk 10 feet on uneven surfaces activity did not occur: Safety/medical concerns         Wheelchair     Assist Will patient use wheelchair at discharge?: Yes Type of Wheelchair: Manual    Wheelchair assist level: Supervision/Verbal cueing Max wheelchair distance: 21'    Wheelchair 50 feet with 2  turns activity    Assist        Assist Level: Supervision/Verbal cueing   Wheelchair 150 feet activity     Assist  Wheelchair 150 feet activity did not occur: Safety/medical concerns   Assist Level: Supervision/Verbal cueing   Blood pressure 126/70, pulse 68, temperature 98.1 F (36.7 C), temperature source Oral, resp. rate 14, height 5' 7.5" (1.715 m), weight 87.1 kg, SpO2 97 %.  Medical Problem List and Plan: 1.Decreased functional mobility with left side weaknesssecondary to right hemispheric traumatic subacute and chronic subdural hematoma with multiple falls. Status post right frontoparietal temporal craniotomy evacuation of subdural hematoma 04/19/2019  -f/u CT Friday s stable to improved with expected evolution of blood products .  --Continue CIR therapies including PT, OT, and SLP   2. Antithrombotics: -DVT/anticoagulation:SCDs -antiplatelet therapy: N/A 3. Pain Management:Hydrocodone as needed 4. Mood:Tofranil 10   2000 nightly per pt request  - Wellbutrin 150 mg daily  -Effexor 37.5 mg daily  - Xanax 0.5 mg nightly as needed -antipsychotic agents: N/A 5. Neuropsych: This patientiscapable of making decisions on hisown behalf. 6. Skin/Wound Care:Routine skin checks  -staples out 7. Fluids/Electrolytes/Nutrition:reasonable intake  -Cr stable at 1.5 sl increased  -push fluids   .   8. Hypertension. Toprol 12.5 mg daily, Altace 1.25 mg daily.   - Vitals:   04/30/19 1934 05/01/19 0426  BP: (!) 117/51 126/70  Pulse: 74 68  Resp: 14 14  Temp: 98 F (36.7 C) 98.1 F (36.7 C)  SpO2: 99% 97%  improved control 9/7  -continue to push fluids  -held altace   -reduced flomax to 0.4mg  9. BPH. Noted urinary retention. 16 French coud catheter placed. Patient followed by urology services Dr Gloriann Loan. -Plan is currently to keep Foley tube until follow up office. -Flomax reduced to 0.4 mg  daily d/t hypotension 10. Hypothyroidism. Synthroid 11. Constipation: colace not effective---add miralax prn, change to senna-s scheduled at hs    LOS: 10 days A FACE TO [place] 05/01/2019, 9:47 AM

## 2019-05-01 NOTE — Progress Notes (Signed)
Speech Language Pathology Daily Session Note  Patient Details  Name: VIDUR ARCOS MRN: [id number] Date of Birth: [birth year]  Today's Date: 05/01/2019 SLP Individual Time: [id number] SLP Individual Time Calculation (min): 26 min  Short Term Goals: Week 2: SLP Short Term Goal 1 (Week 2): STG=LTG due to ELOS  Skilled Therapeutic Interventions:    Skilled treatment session focused on cognition goals. SLP facilitiated session by providing Min A cues to recall game of Rummy that pt wanted to play. Pt required Mod A cues sustained attention to task and he was perseverative on off-topic comments. Pt left upright in recliner, handed off to OT.   Pain    Therapy/Group: Individual Therapy  Miguelangel Korn 05/01/2019, 3:29 PM

## 2019-05-01 NOTE — Progress Notes (Signed)
Physical Therapy Session Note  Patient Details  Name: Benjamin Hahn MRN: [id number] Date of Birth: [birth year]  Today's Date: 05/01/2019 PT Individual Time: [phone] PT Individual Time Calculation (min): 55 min   Short Term Goals: Week 2:  PT Short Term Goal 1 (Week 2): =LTGs due to ELOS  Skilled Therapeutic Interventions/Progress Updates:    Pt finished grooming at sink upon PT arrival. Throughout session during rest breaks and during functional mobility, educated on progress, goals, and recovery from brain injury and ongoing deficits per patient's questions. Focused on NMR to address balance and dual task training via various activities including obstacle negotiation with rollator with focus on toe taps and stepping over objects for higher level balance challenges, gait without AD including forward and retro gait with min assist for balance and cues for upright posture (poor ability to self correct when LOB occurs), and dual task activity performing pipe tree puzzle while on foam wedge (pt able to complete puzzle but it did not fully match what was in the picture (different pieces and not correct lengths) with CGA for balance. Functional gait and transfers on unit with rollator with overall CGA up to and > 150' with focus on safe use of rollator, using brakes as appropriate, positioning and control of rollator, and cues for increasing step length, widening BOS, and cues for heel strike as pt continues with forward flexed posture, shuffled gait pattern, and decreased step length. Educated on fall risk and importance of picking up his feet during mobility and controlling rollator - verbalized understanding but decreased carryover noted.   Therapy Documentation Precautions:  Precautions Precautions: Fall Precaution Comments: Rt crani Restrictions Weight Bearing Restrictions: No    Pain: Pain Assessment Pain Scale: 0-10 Pain Score: 0-No pain    Therapy/Group: Individual  Therapy  [name], PT, DPT, CBIS  05/01/2019, 9:57 AM

## 2019-05-01 NOTE — Progress Notes (Signed)
Occupational Therapy Session Note  Patient Details  Name: Benjamin Hahn MRN: [id number] Date of Birth: 12/17/1932  Today's Date: 05/01/2019 OT Individual Time: [id number] OT Individual Time Calculation (min): 45 min    Short Term Goals: Week 1:  OT Short Term Goal 1 (Week 1): Pt will complete toilet transfer with Min A sit<stand using LRAD OT Short Term Goal 2 (Week 1): Pt will complete LB dressing with supervision for dynamic standing balance OT Short Term Goal 3 (Week 1): Pt will complete shower transfer with Min A and LRAD  Skilled Therapeutic Interventions/Progress Updates:    1:1 SElf care retraining at shower level. Pt used regular RW to navigate around the room. Pt ambulated to toilet and able to demonstrate supervision to contact guard transfer on/ off.  Needed A to doff LB clothing due to foley. Did talk about a LEg bag. Pt requires A to also with threading LB clothing. Pt performed sit to stands today with supervision with more speed and anterior weight shift.  Pt transferred to the w/c with min A without RW. Left to perform shaving at the sink with setup.  Therapy Documentation Precautions:  Precautions Precautions: Fall Precaution Comments: Rt crani Restrictions Weight Bearing Restrictions: No Pain:  no c/o pain in session   Therapy/Group: Individual Therapy  Willeen Cass [facility] 05/01/2019, 8:12 AM

## 2019-05-01 NOTE — Progress Notes (Signed)
Occupational Therapy Session Note  Patient Details  Name: Benjamin Hahn MRN: [id number] Date of Birth: 01-May-1933  Today's Date: 05/01/2019 OT Individual Time: 1345-1445 OT Individual Time Calculation (min): 60 min    Short Term Goals: Week 1:  OT Short Term Goal 1 (Week 1): Pt will complete toilet transfer with Min A sit<stand using LRAD OT Short Term Goal 2 (Week 1): Pt will complete LB dressing with supervision for dynamic standing balance OT Short Term Goal 3 (Week 1): Pt will complete shower transfer with Min A and LRAD  Skilled Therapeutic Interventions/Progress Updates: This session focus on treatmen ideas stated in summary by evalutation/primary OT on as follows:  Left attention:   Patient not locating therapist on the left of environment upon approach for OT though he was carrying on a conversation.   He stated, "Yes, I have been told I need to look left;" yet, he looked to his right.   As well, he completed various activities and games utilizing ROM with left upper extremity within strength and limits in ROM due to rotator cuff limits.   Maintained attention on left environment while completing functional mobility but did run into chair on right environment.  Dynamic standing balance:   Patient was able to maintain dynamic standing balance multiple times to complete functional activities with bilateral upper extremities.    He reqiured close supervision to Min A as he tended to fatigue by the end of the session and fatigued and tended to brace against this clinician or 'place hand on wall to rest.'  Sit to stand training for technique and safety:    he completed sit to stand multiple times with reminders to scoot forward in his recliner to ease sit to stand.    As well, he required reminders to push up from surface rather than pull up onto rollator for more leverage and for safety.  For the initial 3 times he forgot the safety and leverage technique and required tactile and verbal  cues for technique (scoot forward on surface before standing; push up from surface rather than grabbing onto walker, etc)  He did require cues several times to back up to surface and feel the surface against the back of legs and to reach back with at lease 1 upper extremity before sitting.   He had dificulty recalling this technique.  Short term memory incorpated into all of the aforementioned tasks.  Spousal education:  Wife participated in left attention, sit to stand, safe technique and cueing for standing to sitting.   Session lengthened as wife asked to work with husband on simulated 'going to/from dining room table to his recliner at home."   She asked to allow him to practice this activity several times.   With return demonstration, she safely cued her husband.  Safety belt engaged and patient left in the supportive care of his wife at the end of the session.  Continue OT plan of care       Therapy Documentation Precautions:  Precautions Precautions: Fall Precaution Comments: Rt crani Restrictions Weight Bearing Restrictions: No  Pain:denied   Therapy/Group: Individual Therapy  Alfredia Ferguson [facility] 05/01/2019, 3:57 PM

## 2019-05-01 NOTE — Plan of Care (Signed)
  Problem: Consults Goal: RH BRAIN INJURY PATIENT EDUCATION Description: Description: See Patient Education module for eduction specifics Outcome: Progressing Goal: Skin Care Protocol Initiated - if Braden Score 18 or less Description: If consults are not indicated, leave blank or document N/A Outcome: Progressing   Problem: RH BLADDER ELIMINATION Goal: RH STG MANAGE BLADDER WITH ASSISTANCE Description: STG Manage Bladder With cues and reminders Assistance Outcome: Progressing Goal: RH STG MANAGE BLADDER WITH EQUIPMENT WITH ASSISTANCE Description: STG Manage Bladder With Equipment With cues and reminder assist Outcome: Progressing   Problem: RH SAFETY Goal: RH STG ADHERE TO SAFETY PRECAUTIONS W/ASSISTANCE/DEVICE Description: STG Adhere to Safety Precautions With supervision Assistance/Device. Outcome: Progressing   Problem: RH COGNITION-NURSING Goal: RH STG ANTICIPATES NEEDS/CALLS FOR ASSIST W/ASSIST/CUES Description: STG Anticipates Needs/Calls for Assist With mod I Assistance/Cues. Outcome: Progressing   Problem: RH PAIN MANAGEMENT Goal: RH STG PAIN MANAGED AT OR BELOW PT'S PAIN GOAL Description: < 3 Outcome: Progressing   Problem: RH KNOWLEDGE DEFICIT BRAIN INJURY Goal: RH STG INCREASE KNOWLEDGE OF SELF CARE AFTER BRAIN INJURY Description: Patient/caregiver will verbalize/demonstrate understanding of brain injury recovery as directed by care team with mod I assist. Outcome: Progressing

## 2019-05-01 NOTE — Progress Notes (Signed)
Occupational Therapy Session Note  Patient Details  Name: Benjamin Hahn MRN: [id number] Date of Birth: August 16, 1933  Today's Date: 05/01/2019 OT Individual Time: [id number] Individual Time Calculation (min): 22 min    Short Term Goals: Week 1:  OT Short Term Goal 1 (Week 1): Pt will complete toilet transfer with Min A sit<stand using LRAD OT Short Term Goal 2 (Week 1): Pt will complete LB dressing with supervision for dynamic standing balance OT Short Term Goal 3 (Week 1): Pt will complete shower transfer with Min A and LRAD  Skilled Therapeutic Interventions/Progress Updates: patient willing to start session at this time and finish later (due to prior patient in procedure for first portion of her scheduled session).    Patient able to complete donning of shoes and socks from his recliner with extra time and setup.   As well, he completed sit to stand x6 with reminders to scoot forward in his recliner to ease sit to stand.    As well, he required reminders to push up from surface rather than pull up onto rollator for more leverage and for safety.   As well, he was reminded that pulling up via the rollator, the rollator could roll away and create a fall.   He required repeated reminders the first 4 tries.   He recalled in short term memory the 5th and 6th attempt to push up from recliner rather than pull up via rollator.  He was left seated in his recliner with safety belt in place at the end of session.  Patient to continue his OT session later in the day at his scheduled time.        Therapy Documentation Precautions:  Precautions Precautions: Fall Precaution Comments: Rt crani Restrictions Weight Bearing Restrictions: No  Pain:denied    Therapy/Group: Individual Therapy  Alfredia Ferguson [facility] 05/01/2019, 3:45 PM

## 2019-05-02 ENCOUNTER — Inpatient Hospital Stay (HOSPITAL_COMMUNITY): Payer: Medicare Other | Admitting: Speech Pathology

## 2019-05-02 ENCOUNTER — Inpatient Hospital Stay (HOSPITAL_COMMUNITY): Payer: Medicare Other | Admitting: Occupational Therapy

## 2019-05-02 ENCOUNTER — Inpatient Hospital Stay (HOSPITAL_COMMUNITY): Payer: Medicare Other

## 2019-05-02 ENCOUNTER — Inpatient Hospital Stay (HOSPITAL_COMMUNITY): Payer: Medicare Other | Admitting: Physical Therapy

## 2019-05-02 MED ORDER — SORBITOL 70 % SOLN
60.0000 mL | Status: AC
  Administered 2019-05-02: 60 mL via ORAL
  Filled 2019-05-02: qty 60

## 2019-05-02 NOTE — Patient Care Conference (Signed)
Inpatient RehabilitationTeam Conference and Plan of Care Update Date: 05/02/2019   Time: 10:40 AM    Patient Name: Benjamin Hahn      Medical Record Number: [id number]  Date of Birth: Dec 23, 1932 Sex: Male         Room/Bed: 4W05C/4W05C-01 Payor Info: Payor: MEDICARE / Plan: MEDICARE PART A AND B / Product Type: *No Product type* /    Admitting Diagnosis: 2. ABI Team  Closed TBI, SDH; 11-14days  Admit Date/Time:  04/21/2019  1:55 PM Admission Comments: No comment available   Primary Diagnosis:  <principal problem not specified> Principal Problem: <principal problem not specified>  Patient Active Problem List   Diagnosis Date Noted  . Traumatic subdural hematoma ([place]) 04/21/2019  . Subdural hematoma ([place]) 04/17/2019  . Confusion 04/15/2019  . Urinary frequency 04/15/2019  . Memory changes 04/15/2019  . Hyperglycemia 04/15/2019  . Shuffling gait 04/15/2019  . Weakness generalized 04/14/2019  . Mild intermittent asthma with acute exacerbation 02/08/2018  . Hypothyroidism 03/14/2013  . Hyperlipidemia LDL goal <70 03/14/2013  . BPH (benign prostatic hypertrophy) 03/14/2013  . Hypogonadism, male 03/14/2013  . Erectile dysfunction 03/14/2013    Expected Discharge Date: Expected Discharge Date: 05/06/19  Team Members Present: Physician leading conference: Dr. Alger Simons Social Worker Present: Lennart Pall, LCSW Nurse Present: Mohammed Kindle, RN PT Present: Burnard Bunting, PT OT Present: Cherylynn Ridges, OT SLP Present: Weston Anna, SLP PPS Coordinator present : Gunnar Fusi, SLP     Current Status/Progress Goal Weekly Team Focus  Medical   right SDH progress. improved cognitively. constipated. incision cdi, ct shows improvement  finalize medical/surgical considerations for discharge  see above   Bowel/Bladder   continent of bowel, LBM 05/01/19, has a foley catheter with clear yellow urine  remain continent  monitor & assist as needed   Swallow/Nutrition/ Hydration    ADL's   Min/CGA overall  supervision  self-care retraining, fine-motor coordination, cognition, balance, activity tolerance   Mobility   close supervision-CGA overall, gait w/ rollator or RW x150', 27/56 on [place]  supervision  education and d/c planning, endurance, functional balance, BI education   Communication             Safety/Cognition/ Behavioral Observations  Min A  Supervision  Complex problem solving and recall   Pain   c/o pain to his head 6.5/10, has norco 1-2 tabs & tylenol prn  pain scale <4/10  assess & treat as needed   Skin   no skin breakdown, multiple moles on back  no skin break down while on rehab  assess q shift    Rehab Goals Patient on target to meet rehab goals: Yes *See Care Plan and progress notes for long and short-term goals.     Barriers to Discharge  Current Status/Progress Possible Resolutions Date Resolved   Physician    Medical stability        see medical problem list      Nursing                  PT                    OT                  SLP                SW                Discharge Planning/Teaching Needs:  Pt to d/c  home with wife who can provide 24/7 supervision.  Teaching is ongoing with wife who visits daily.   Team Discussion:  Still with constipation and foley (will follow up with urology as OP).  CG - min assist with ADLs overall and mobility.  Balance remains a concern and still trying to educate wife on concerns and need to monitor closely.  Recommend OP f/u  Revisions to Treatment Plan:  NA    Continued Need for Acute Rehabilitation Level of Care: The patient requires daily medical management by a physician with specialized training in physical medicine and rehabilitation for the following conditions: Daily direction of a multidisciplinary physical rehabilitation program to ensure safe treatment while eliciting the highest outcome that is of practical value to the patient.: Yes Daily medical management of patient  stability for increased activity during participation in an intensive rehabilitation regime.: Yes Daily analysis of laboratory values and/or radiology reports with any subsequent need for medication adjustment of medical intervention for : Post surgical problems;Neurological problems   I attest that I was present, lead the team conference, and concur with the assessment and plan of the team.   Donata Clay, Carter Kaman 05/02/2019, 3:11 PM     Team conference was held via web/ teleconference due to Circle - [phone]

## 2019-05-02 NOTE — Progress Notes (Signed)
[place] PHYSICAL MEDICINE & REHABILITATION PROGRESS NOTE   Subjective/Complaints:   Still hasn't moved bowels any further. Denies any other issues  ROS: Patient denies fever, rash, sore throat, blurred vision, nausea, vomiting, diarrhea, cough, shortness of breath or chest pain, joint or back pain, headache, or mood change.    Objective:   No results found. Recent Labs    05/01/19 0523  WBC 7.3  HGB 10.3*  HCT 31.8*  PLT 272   Recent Labs    05/01/19 0523  NA 138  K 4.0  CL 101  CO2 29  GLUCOSE 97  BUN 26*  CREATININE 1.50*  CALCIUM 8.9    Intake/Output Summary (Last 24 hours) at 05/02/2019 [id number] Last data filed at 05/02/2019 0531 Gross per 24 hour  Intake 360 ml  Output 1875 ml  Net -1515 ml     Physical Exam: Vital Signs Blood pressure 129/67, pulse 72, temperature 98 F (36.7 C), temperature source Oral, resp. rate 18, height 5' 7.5" (1.715 m), weight 87.1 kg, SpO2 98 %.  Constitutional: No distress . Vital signs reviewed. HEENT: EOMI, oral membranes moist Neck: supple Cardiovascular: RRR without murmur. No JVD    Respiratory: CTA Bilaterally without wheezes or rales. Normal effort    GI: BS +, non-tender, non-distended   Genitourinary:  Indwelling Foley catheter tube in place--urine remains clear Musculoskeletal:  General: No deformityor edema.  Neurological: alert. Normal language. Follows commands. improved insight. Mild left inattention. Moves all 4 limbs. Mild left pronator drift which is improving. . Motor 4/5 all 4. No focal sensory findings. improving  standing balance.  Skin: scalp wound closed Psychiatric: pleasantand cooperative   Assessment/Plan: 1. Functional deficits secondary to right SDH after fall which require 3+ hours per day of interdisciplinary therapy in a comprehensive inpatient rehab setting.  Physiatrist is providing close team supervision and 24 hour management of active medical problems listed  below.  Physiatrist and rehab team continue to assess barriers to discharge/monitor patient progress toward functional and medical goals  Care Tool:  Bathing    Body parts bathed by patient: Right arm, Left arm, Chest, Abdomen, Buttocks, Front perineal area, Right upper leg, Left upper leg, Right lower leg, Left lower leg, Face         Bathing assist Assist Level: Minimal Assistance - Patient > 75%     Upper Body Dressing/Undressing Upper body dressing   What is the patient wearing?: Pull over shirt    Upper body assist Assist Level: Contact Guard/Touching assist    Lower Body Dressing/Undressing Lower body dressing      What is the patient wearing?: Underwear/pull up, Pants     Lower body assist Assist for lower body dressing: Minimal Assistance - Patient > 75%     Toileting Toileting    Toileting assist Assist for toileting: Supervision/Verbal cueing     Transfers Chair/bed transfer  Transfers assist     Chair/bed transfer assist level: Contact Guard/Touching assist     Locomotion Ambulation   Ambulation assist      Assist level: Contact Guard/Touching assist Assistive device: Rollator Max distance: 150'   Walk 10 feet activity   Assist     Assist level: Contact Guard/Touching assist Assistive device: Rollator   Walk 50 feet activity   Assist    Assist level: Contact Guard/Touching assist Assistive device: Rollator    Walk 150 feet activity   Assist Walk 150 feet activity did not occur: Safety/medical concerns  Assist level: Minimal Assistance -  Patient > 75% Assistive device: Rollator    Walk 10 feet on uneven surface  activity   Assist Walk 10 feet on uneven surfaces activity did not occur: Safety/medical concerns         Wheelchair     Assist Will patient use wheelchair at discharge?: Yes Type of Wheelchair: Manual    Wheelchair assist level: Supervision/Verbal cueing Max wheelchair distance: 82'     Wheelchair 50 feet with 2 turns activity    Assist        Assist Level: Supervision/Verbal cueing   Wheelchair 150 feet activity     Assist  Wheelchair 150 feet activity did not occur: Safety/medical concerns   Assist Level: Supervision/Verbal cueing   Blood pressure 129/67, pulse 72, temperature 98 F (36.7 C), temperature source Oral, resp. rate 18, height 5' 7.5" (1.715 m), weight 87.1 kg, SpO2 98 %.  Medical Problem List and Plan: 1.Decreased functional mobility with left side weaknesssecondary to right hemispheric traumatic subacute and chronic subdural hematoma with multiple falls. Status post right frontoparietal temporal craniotomy evacuation of subdural hematoma 04/19/2019  -team conference today  --Continue CIR therapies including PT, OT, and SLP   2. Antithrombotics: -DVT/anticoagulation:SCDs -antiplatelet therapy: N/A 3. Pain Management:Hydrocodone as needed 4. Mood:Tofranil 10   2000 nightly per pt request  - Wellbutrin 150 mg daily  -Effexor 37.5 mg daily  - Xanax 0.5 mg nightly as needed -antipsychotic agents: N/A 5. Neuropsych: This patientiscapable of making decisions on hisown behalf. 6. Skin/Wound Care:Routine skin checks  -staples out 7. Fluids/Electrolytes/Nutrition:reasonable intake  -Cr stable at 1.5 sl increased  -push fluids   .f/u bmet tomorrow   8. Hypertension. Toprol 12.5 mg daily, Altace 1.25 mg daily.   - Vitals:   05/01/19 1923 05/02/19 0534  BP: 122/79 129/67  Pulse: 70 72  Resp: 15 18  Temp: 98 F (36.7 C) 98 F (36.7 C)  SpO2: 98% 98%  improved control 9/8  -continue to push fluids  -held altace   -reduced flomax to 0.4mg  9. BPH. Noted urinary retention. 16 French coud catheter placed. Patient followed by urology services Dr Gloriann Loan. -Plan is currently to keep Foley tube until follow up office. -Flomax reduced to 0.4 mg daily d/t  hypotension 10. Hypothyroidism. Synthroid 11. Constipation: colace not effective---add miralax prn, change to senna-s scheduled at hs    LOS: 11 days A FACE TO FACE EVALUATION WAS PERFORMED  Meredith Staggers 05/02/2019, 9:22 AM

## 2019-05-02 NOTE — Plan of Care (Signed)
  Problem: Consults Goal: RH BRAIN INJURY PATIENT EDUCATION Description: Description: See Patient Education module for eduction specifics Outcome: Progressing Goal: Skin Care Protocol Initiated - if Braden Score 18 or less Description: If consults are not indicated, leave blank or document N/A Outcome: Progressing   Problem: RH BLADDER ELIMINATION Goal: RH STG MANAGE BLADDER WITH ASSISTANCE Description: STG Manage Bladder With cues and reminders Assistance Outcome: Progressing Goal: RH STG MANAGE BLADDER WITH EQUIPMENT WITH ASSISTANCE Description: STG Manage Bladder With Equipment With cues and reminder assist Outcome: Progressing   Problem: RH SAFETY Goal: RH STG ADHERE TO SAFETY PRECAUTIONS W/ASSISTANCE/DEVICE Description: STG Adhere to Safety Precautions With supervision Assistance/Device. Outcome: Progressing   Problem: RH COGNITION-NURSING Goal: RH STG ANTICIPATES NEEDS/CALLS FOR ASSIST W/ASSIST/CUES Description: STG Anticipates Needs/Calls for Assist With mod I Assistance/Cues. Outcome: Progressing   Problem: RH PAIN MANAGEMENT Goal: RH STG PAIN MANAGED AT OR BELOW PT'S PAIN GOAL Description: < 3 Outcome: Progressing   Problem: RH KNOWLEDGE DEFICIT BRAIN INJURY Goal: RH STG INCREASE KNOWLEDGE OF SELF CARE AFTER BRAIN INJURY Description: Patient/caregiver will verbalize/demonstrate understanding of brain injury recovery as directed by care team with mod I assist. Outcome: Progressing

## 2019-05-02 NOTE — Progress Notes (Signed)
Social Work Patient ID: Benjamin Hahn, male   DOB: 03-Sep-1932, 85 y.o.   MRN: [id number]    Have reviewed team conference with pt and wife who are both feeling ready for d/c the end of the week.  Discussed recommendation of OP therapies and they are agreeable.  Will refer.  Shirely Toren, LCSW

## 2019-05-02 NOTE — Progress Notes (Signed)
Occupational Therapy Weekly Progress Note  Patient Details  Name: Benjamin Hahn MRN: [id number] Date of Birth: 1932/11/17  Beginning of progress report period: April 22, 2019 End of progress report period: May 02, 2019  Today's Date: 05/02/2019 OT Individual Time: 1300-1430 OT Individual Time Calculation (min): 90 min    Patient has met 3 of 3 short term goals.  Pt is making steady progress towards OT goals. Pt is at overall CGA/min A level for BADL tasks and functional transfers. PT continues to have deficits in balance and safety awarness, as well as L hand attention/coordination affecting participation in BADL tasks.   Patient continues to demonstrate the following deficits: muscle weakness, decreased cardiorespiratoy endurance, unbalanced muscle activation, motor apraxia, decreased coordination and decreased motor planning, decreased attention to left, decreased initiation, decreased attention, decreased awareness, decreased problem solving and decreased safety awareness and decreased sitting balance, decreased standing balance, decreased postural control, hemiplegia and decreased balance strategies and therefore will continue to benefit from skilled OT intervention to enhance overall performance with BADL.  Patient progressing toward long term goals..  Continue plan of care.  OT Short Term Goals Week 1:  OT Short Term Goal 1 (Week 1): Pt will complete toilet transfer with Min A sit<stand using LRAD OT Short Term Goal 1 - Progress (Week 1): Met OT Short Term Goal 2 (Week 1): Pt will complete LB dressing with supervision for dynamic standing balance OT Short Term Goal 2 - Progress (Week 1): Met OT Short Term Goal 3 (Week 1): Pt will complete shower transfer with Min A and LRAD OT Short Term Goal 3 - Progress (Week 1): Met Week 2:  OT Short Term Goal 1 (Week 2): LTG=STG 2/2 ELOS  Skilled Therapeutic Interventions/Progress Updates:      Pt greeted seated in recliner and  agreeable to OT treatment session. Spouse present and wanted to see pt practice sit<>stands from straight back chair like they will have at home. Pt stood from recliner with CGA, then ambulated in the room with rollator, CGA, and verbal cues for rollator positioning when turning to sit in chair. Pt completed 10 sit<>stands with focus on anterior weight shift 7/10 stands were CGA, but 3 of the stands pt had posterior LOB and needed min/mod A to correct, or sit down safely. Pt ambulated to therapy apartment with overall CGA, Rollator, and verbal cues for body positioning. Pt sat on recliner while OT set-up simulated walk-in shower transfer. OT educated on different DME options for shower with decision to use 3-in-1 BSC in the shower 2/2 having handles that pt can push up from. Practiced stepping over shower ledge with mod verbal cues for technique and CGA. Educated pt's spouse on high fall risk and need for 24/7 supervision-spouse agreeable. Pt's spouse practiced providing CGA for shower transfer and providing appropriate cues for safety. Pt then ambulated to therapy gym in similar fashion and worked on L hand coordination with graded peg board task focused on in-hand manipulation, translation, and rotation. Progressed to peg board puzzle with more complicated puzzle. Pt needed more cues to use L hand within more complex task and min cues to complete accurately. Began standing [employer] activity, when pt suddenly stated he needed to go to the bathroom. Pt stated "it's running down my leg." Pt with large episode of loose bowel incontinents. Got pt to nearest restroom with max cues for safety and rollator management as he was rushing to get to the commode. Pt with more loose BM once seated  on commode. Pt with loose bowel all over self, gym, and bathroom. Max A to clean up to minimize spread of BM onto therapist or in the room. OT assisted with changing pt into brief and clean scrub pants. Pt ambulated back to room and  sat in recliner with CGA for balance when backing into chair. OT washed out shoes, pants, and underware in the shower. Pt left seated in recliner with alarm belt on and needs met.   Therapy Documentation Precautions:  Precautions Precautions: Fall Precaution Comments: Rt crani Restrictions Weight Bearing Restrictions: No Pain:  Denies pain  Therapy/Group: Individual Therapy  Benjamin Hahn 05/02/2019, 1:48 PM

## 2019-05-02 NOTE — Progress Notes (Signed)
Speech Language Pathology Daily Session Note  Patient Details  Name: Benjamin Hahn MRN: [id number] Date of Birth: February 08, 1933  Today's Date: 05/02/2019 SLP Individual Time: 0730-0825 SLP Individual Time Calculation (min): 55 min  Short Term Goals: Week 2: SLP Short Term Goal 1 (Week 2): STG=LTG due to ELOS  Skilled Therapeutic Interventions: Skilled treatment session focused on cognitive goals. SLP facilitated session by providing extra time and Mod A verbal cues for problem solving while typing in e-mail. Patient with frequent errors due to inattention to LUE while typing, therefore, utilized the strategy of typing with only his RUE while utilizing his stylus. Patient demonstrated emergent awareness in regards to difficulty of task. Patient left upright in recliner with all needs within reach. Continue with current plan of care.      Pain Pain Assessment Pain Scale: 0-10 Pain Score: 0-No pain  Therapy/Group: Individual Therapy  Janissa Bertram, [place] 05/02/2019, 10:13 AM

## 2019-05-02 NOTE — Progress Notes (Signed)
Physical Therapy Session Note  Patient Details  Name: Benjamin Hahn MRN: [id number] Date of Birth: 10/29/1932  Today's Date: 05/02/2019 PT Individual Time: 0912-1022 PT Individual Time Calculation (min): 70 min   Short Term Goals: Week 2:  PT Short Term Goal 1 (Week 2): =LTGs due to ELOS  Skilled Therapeutic Interventions/Progress Updates:   Pt in recliner and agreeable to therapy, no c/o pain. Ambulated to/from therapy gym w/ close supervision-CGA using rollator and verbal cues to increase foot clearance and step length bilaterally, although pt overall improved since last session with this therapist. Practiced curb negotiation w/ rollator as he has a threshold to enter the house, visual instruction and then pt performed w/ min assist for rollator management and verbal cues for technique. Practiced steps w/ R rail as per access to pt's work office, negotiated 3" steps x4 w/ CGA. Worked on postural control and functional balance remainder of session. Ambulated around cones w/ rollator, CGA-close supervision and verbal cues to slow speed for improved rollator management and postural control. Stood to perform cognitive tasks in standing w/o UE support. Close supervision for standing balance, tactile and verbal cues for full trunk upright. Occasional visual cues for successful completion of pipe tree. Performed Berg Balance Scale and scored 27/56, explained significant of results to pt, reinforcing importance of 24/7 supervision and AD use upon d/c. Ongoing education regarding high fall risk. Ambulated around unit in multiple bouts of 150-200' w/ close supervision-CGA while performing dual task activities including naming, short term memory, and way-finding. Pt w/ slower gait speed and increased B foot drag with any dual task, pointed this out to him and pt able to recognize it as well and verbalized understanding of increased fall risk w/ dual task. Rest breaks in rollator. Returned to room and ended  session in recliner, all needs in reach.   Therapy Documentation Precautions:  Precautions Precautions: Fall Precaution Comments: Rt crani Restrictions Weight Bearing Restrictions: No Vital Signs:    Therapy/Group: Individual Therapy  Dam Ashraf K Brenly Trawick 05/02/2019, 10:25 AM

## 2019-05-03 ENCOUNTER — Inpatient Hospital Stay (HOSPITAL_COMMUNITY): Payer: Medicare Other | Admitting: Physical Therapy

## 2019-05-03 ENCOUNTER — Inpatient Hospital Stay (HOSPITAL_COMMUNITY): Payer: Medicare Other

## 2019-05-03 ENCOUNTER — Inpatient Hospital Stay (HOSPITAL_COMMUNITY): Payer: Medicare Other | Admitting: Speech Pathology

## 2019-05-03 MED ORDER — INFLUENZA VAC A&B SA ADJ QUAD 0.5 ML IM PRSY
0.5000 mL | PREFILLED_SYRINGE | INTRAMUSCULAR | Status: AC
  Administered 2019-05-04: 11:00:00 0.5 mL via INTRAMUSCULAR
  Filled 2019-05-03: qty 0.5

## 2019-05-03 NOTE — Progress Notes (Signed)
PHYSICAL MEDICINE & REHABILITATION PROGRESS NOTE   Subjective/Complaints:  Feels better after moving bowels. He had such a large amount of bowel movements that he felt chest pain momentarily. No further issues after that  ROS: Patient denies fever, rash, sore throat, blurred vision, nausea, vomiting, diarrhea, cough, shortness of breath   joint or back pain, headache, or mood change.    Objective:   No results found. Recent Labs    05/01/19 0523  WBC 7.3  HGB 10.3*  HCT 31.8*  PLT 272   Recent Labs    05/01/19 0523  NA 138  K 4.0  CL 101  CO2 29  GLUCOSE 97  BUN 26*  CREATININE 1.50*  CALCIUM 8.9    Intake/Output Summary (Last 24 hours) at 05/03/2019 0830 Last data filed at 05/03/2019 0539 Gross per 24 hour  Intake 540 ml  Output 1050 ml  Net -510 ml     Physical Exam: Vital Signs Blood pressure 121/69, pulse 73, temperature 98 F (36.7 C), temperature source Oral, resp. rate 16, height 5' 7.5" (1.715 m), weight 87.1 kg, SpO2 98 %.  Constitutional: No distress . Vital signs reviewed. HEENT: EOMI, oral membranes moist Neck: supple Cardiovascular: RRR without murmur. No JVD    Respiratory: CTA Bilaterally without wheezes or rales. Normal effort    GI: BS +, non-tender, non-distended  Genitourinary:  Indwelling Foley catheter tube in place--urine remains straw colored Musculoskeletal:  General: No deformityor edema.  Neurological: alert. Normal language. Follows commands. improved insight. Mild left inattention. Moves all 4 limbs. Mild left pronator drift which is improving. . Motor 4/5 all 4. No focal sensory findings.    Skin: scalp wound closed Psychiatric: Pleasant and engaged   Assessment/Plan: 1. Functional deficits secondary to right SDH after fall which require 3+ hours per day of interdisciplinary therapy in a comprehensive inpatient rehab setting.  Physiatrist is providing close team supervision and 24 hour management of active  medical problems listed below.  Physiatrist and rehab team continue to assess barriers to discharge/monitor patient progress toward functional and medical goals  Care Tool:  Bathing    Body parts bathed by patient: Right arm, Left arm, Chest, Abdomen, Buttocks, Front perineal area, Right upper leg, Left upper leg, Right lower leg, Left lower leg, Face         Bathing assist Assist Level: Minimal Assistance - Patient > 75%     Upper Body Dressing/Undressing Upper body dressing   What is the patient wearing?: Pull over shirt    Upper body assist Assist Level: Contact Guard/Touching assist    Lower Body Dressing/Undressing Lower body dressing      What is the patient wearing?: Underwear/pull up, Pants     Lower body assist Assist for lower body dressing: Minimal Assistance - Patient > 75%     Toileting Toileting    Toileting assist Assist for toileting: Supervision/Verbal cueing     Transfers Chair/bed transfer  Transfers assist     Chair/bed transfer assist level: Contact Guard/Touching assist     Locomotion Ambulation   Ambulation assist      Assist level: Contact Guard/Touching assist Assistive device: Rollator Max distance: 150'   Walk 10 feet activity   Assist     Assist level: Contact Guard/Touching assist Assistive device: Rollator   Walk 50 feet activity   Assist    Assist level: Contact Guard/Touching assist Assistive device: Rollator    Walk 150 feet activity   Assist Walk 150 feet  activity did not occur: Safety/medical concerns  Assist level: Contact Guard/Touching assist Assistive device: Rollator    Walk 10 feet on uneven surface  activity   Assist Walk 10 feet on uneven surfaces activity did not occur: Safety/medical concerns         Wheelchair     Assist Will patient use wheelchair at discharge?: Yes Type of Wheelchair: Manual    Wheelchair assist level: Supervision/Verbal cueing Max wheelchair  distance: 13'    Wheelchair 50 feet with 2 turns activity    Assist        Assist Level: Supervision/Verbal cueing   Wheelchair 150 feet activity     Assist  Wheelchair 150 feet activity did not occur: Safety/medical concerns   Assist Level: Supervision/Verbal cueing   Blood pressure 121/69, pulse 73, temperature 98 F (36.7 C), temperature source Oral, resp. rate 16, height 5' 7.5" (1.715 m), weight 87.1 kg, SpO2 98 %.  Medical Problem List and Plan: 1.Decreased functional mobility with left side weaknesssecondary to right hemispheric traumatic subacute and chronic subdural hematoma with multiple falls. Status post right frontoparietal temporal craniotomy evacuation of subdural hematoma 04/19/2019  -ELOS 9/12--discussed home safety --Continue CIR therapies including PT, OT, and SLP   2. Antithrombotics: -DVT/anticoagulation:SCDs -antiplatelet therapy: N/A 3. Pain Management:Hydrocodone as needed 4. Mood:Tofranil 10   2000 nightly per pt request  - Wellbutrin 150 mg daily  -Effexor 37.5 mg daily  - Xanax 0.5 mg nightly as needed -antipsychotic agents: N/A 5. Neuropsych: This patientiscapable of making decisions on hisown behalf. 6. Skin/Wound Care:Routine skin checks  -staples out 7. Fluids/Electrolytes/Nutrition:reasonable intake  -Cr stable at 1.5 sl increased 9/7  -push fluids   .f/u bmet 9/10  8. Hypertension. Toprol 12.5 mg daily, Altace 1.25 mg daily.   - Vitals:   05/02/19 1918 05/03/19 0539  BP: 127/73 121/69  Pulse: 81 73  Resp: 18 16  Temp: 98.2 F (36.8 C) 98 F (36.7 C)  SpO2: 100% 98%  improved control 9/9  -continue to push fluids  -held altace   -reduced flomax to 0.4mg  9. BPH. Noted urinary retention. 16 French coud catheter placed. Patient followed by urology services Dr Gloriann Loan. -Plan is currently to keep Foley tube until follow up office. -Flomax reduced  to 0.4 mg daily d/t hypotension 10. Hypothyroidism. Synthroid 11. Constipation: + results with sorbitol  -continue daily senna-s    LOS: 12 days A FACE TO FACE EVALUATION WAS PERFORMED  Meredith Staggers 05/03/2019, 8:30 AM

## 2019-05-03 NOTE — Plan of Care (Signed)
  Problem: RH BLADDER ELIMINATION Goal: RH STG MANAGE BLADDER WITH ASSISTANCE Description: STG Manage Bladder With cues and reminders Assistance Outcome: Not Progressing; foley

## 2019-05-03 NOTE — Progress Notes (Signed)
Physical Therapy Session Note  Patient Details  Name: Benjamin Hahn MRN: [id number] Date of Birth: Jan 12, 1933  Today's Date: 05/03/2019 PT Individual Time: 1430-1526 PT Individual Time Calculation (min): 56 min   Short Term Goals: Week 2:  PT Short Term Goal 1 (Week 2): =LTGs due to ELOS  Skilled Therapeutic Interventions/Progress Updates:   Pt in recliner and agreeable to therapy, no c/o pain. Session focused on functional ambulation in household and community environment and discharge planning w/ rec therapist. Ambulated around unit in 150-250' bouts w/ CGA-close supervision using rollator. CGA-min assist for safety in community environment while negotiating uneven surfaces, ramp, and obstacle course. Additionally performed these tasks in dynamic/busy environments w/ min verbal cues for safety. Educated on distractibility and uncertainty of community environment, especially in setting of BI. Pt began to c/o chest pain during obstacle course. Unable to get vitals 2/2 poor perfusion but no signs or complaints of feeling faint/lightheaded. Chest pain resolved within a few minutes. Made PA aware and PA present to assess pt. PA requesting pt to rest remainder of session. Returned to room and ended session in recliner, all needs in reach. RN made aware and present at end of session as well.   Therapy Documentation Precautions:  Precautions Precautions: Fall Precaution Comments: Rt crani Restrictions Weight Bearing Restrictions: No Vital Signs: Therapy Vitals Temp: 98.1 F (36.7 C) Temp Source: Oral Pulse Rate: 84 Resp: 20 BP: (!) 101/53 Patient Position (if appropriate): Sitting Oxygen Therapy SpO2: 93 % O2 Device: Room Air  Therapy/Group: Individual Therapy  Cynitha Berte K Gissella Niblack 05/03/2019, 3:30 PM

## 2019-05-03 NOTE — Progress Notes (Signed)
Recreational Therapy Session Note  Patient Details  Name: Benjamin Hahn MRN: [id number] Date of Birth: [birth year] Today's Date: 05/03/2019 Time:  1435-1530  Pain: c/o of chest pain, PA made aware and addressed, recommended rest this afternoon, RN made aware upon return to the room.  Pt also with c/o HA, RN gave tylenol Skilled Therapeutic Interventions/Progress Updates: Session focused on community mobility/obstacle course negotiation using rollator including up and down a ramp, walking through mulch, on and over obstacle with contact guard assist, min cues for safety.  After the above stated, tasks pt c/o of chest pain.  Unable to get vitals due to poor perfusion but no signs or complaints of feeling faint/lightheaded. Chest pain resolved within a few minutes. Vena Rua, PA aware and PA in to assess pt.   PA requesting pt to rest remainder of session. Returned to room and ended session in recliner, all needs in reach. RN made aware and present at end of session as well.  Therapy/Group: Co-Treatment  Aby Gessel 05/03/2019, 3:38 PM

## 2019-05-03 NOTE — Progress Notes (Signed)
Occupational Therapy Session Note  Patient Details  Name: Benjamin Hahn MRN: [id number] Date of Birth: 1933-01-02  70 min   Short Term Goals: Week 1:  OT Short Term Goal 1 (Week 1): Pt will complete toilet transfer with Min A sit<stand using LRAD OT Short Term Goal 1 - Progress (Week 1): Met OT Short Term Goal 2 (Week 1): Pt will complete LB dressing with supervision for dynamic standing balance OT Short Term Goal 2 - Progress (Week 1): Met OT Short Term Goal 3 (Week 1): Pt will complete shower transfer with Min A and LRAD OT Short Term Goal 3 - Progress (Week 1): Met  Skilled Therapeutic Interventions/Progress Updates:    1:1. Pt received in recliner with no pain. Pt completes functional mobility with rolator and min VC for brake use to when gathering clothing and transfering into  Shower with CGA fading to S overall. Pt bathes with S at sit to stand level at shower with VC for anterior weight shift with grab bar when standing to wash buttocks. Pt completes dressing at sit to stand from toilet with VC for threading LLE into correct leg hole. Pt dons shirt with set up in standing. Pt shaves at sink with set up.pt able to don shoes and teds with VC for spinning blue square over heal in seated. PT completes 150' of functional mobility with rolator with VC for picking up feet as if he was "marching" to prevent scuffing on floor and to stand closer to the rolator. Exied session with pt seated in w/c, call light in reach and allneeds met  Therapy Documentation Precautions:  Precautions Precautions: Fall Precaution Comments: Rt crani Restrictions Weight Bearing Restrictions: No General:   Vital Signs:   Pain:   ADL: ADL Eating: Not assessed Grooming: Moderate assistance(shaving) Where Assessed-Grooming: Sitting at sink Upper Body Bathing: Supervision/safety Where Assessed-Upper Body Bathing: Sitting at sink Lower Body Bathing: Minimal assistance Where Assessed-Lower Body  Bathing: Sitting at sink, Standing at sink Upper Body Dressing: Supervision/safety Where Assessed-Upper Body Dressing: Sitting at sink Lower Body Dressing: Moderate assistance Where Assessed-Lower Body Dressing: Sitting at sink, Standing at sink Toileting: Not assessed Toilet Transfer: Moderate assistance Toilet Transfer Method: Counselling psychologist: Energy manager: Not assessed Vision   Perception    Praxis   Exercises:   Other Treatments:     Therapy/Group: Individual Therapy  Tonny Branch 05/03/2019, 10:13 AM

## 2019-05-03 NOTE — Progress Notes (Signed)
Speech Language Pathology Daily Session Note  Patient Details  Name: Benjamin Hahn MRN: [id number] Date of Birth: Oct 09, 1932  Today's Date: 05/03/2019 SLP Individual Time: 0800-0855 SLP Individual Time Calculation (min): 55 min  Short Term Goals: Week 2: SLP Short Term Goal 1 (Week 2): STG=LTG due to ELOS  Skilled Therapeutic Interventions: Skilled treatment session focused on cognitive goals. SLP facilitated session by providing Min A verbal and visual cues for patient to self-monitor and correct errors during a mildly complex calendar organization task. However, patient only required supervision verbal cues for problem solving and organization during a complex organization task in which he had to schedule several events throughout a day. Patient left upright in recliner with alarm on and all needs within reach. Continue with current plan of care.      Pain No/Denies Pain  Therapy/Group: Individual Therapy  Sharla Tankard 05/03/2019, 3:30 PM

## 2019-05-04 ENCOUNTER — Inpatient Hospital Stay (HOSPITAL_COMMUNITY): Payer: Medicare Other | Admitting: Speech Pathology

## 2019-05-04 ENCOUNTER — Inpatient Hospital Stay (HOSPITAL_COMMUNITY): Payer: Medicare Other | Admitting: Occupational Therapy

## 2019-05-04 ENCOUNTER — Inpatient Hospital Stay (HOSPITAL_COMMUNITY): Payer: Medicare Other | Admitting: Physical Therapy

## 2019-05-04 LAB — BASIC METABOLIC PANEL
Anion gap: 9 (ref 5–15)
BUN: 26 mg/dL — ABNORMAL HIGH (ref 8–23)
CO2: 26 mmol/L (ref 22–32)
Calcium: 8.8 mg/dL — ABNORMAL LOW (ref 8.9–10.3)
Chloride: 102 mmol/L (ref 98–111)
Creatinine, Ser: 1.43 mg/dL — ABNORMAL HIGH (ref 0.61–1.24)
GFR calc Af Amer: 52 mL/min — ABNORMAL LOW (ref >60)
GFR calc non Af Amer: 45 mL/min — ABNORMAL LOW (ref >60)
Glucose, Bld: 91 mg/dL (ref 70–99)
Potassium: 3.8 mmol/L (ref 3.5–5.1)
Sodium: 137 mmol/L (ref 135–145)

## 2019-05-04 NOTE — Discharge Summary (Signed)
Physician Discharge Summary  Patient ID: Benjamin Hahn MRN: [id number] DOB/AGE: 83/23/1934 83 y.o.  Admit date: 04/21/2019 Discharge date: 05/06/2019  Discharge Diagnoses:  Active Problems:   Traumatic subdural hematoma (HCC) DVT prophylaxis Pain management Mood Hypertension BPH with urinary retention Hypothyroidism Constipation  Discharged Condition: Stable  Significant Diagnostic Studies: Ct Head Wo Contrast  Result Date: 04/28/2019 CLINICAL DATA:  Follow-up craniotomy for subdural hematoma EXAM: CT HEAD WITHOUT CONTRAST TECHNIQUE: Contiguous axial images were obtained from the base of the skull through the vertex without intravenous contrast. COMPARISON:  CT 04/21/2019, MRI 04/17/2019 FINDINGS: Brain: Right subdural drain has been removed since the prior CT. Mixed density subdural fluid collection on the left shows progression since the prior CT of 04/21/2019. Low-density fluid anteriorly in the right frontal lobe measures 10 mm in thickness which is similar in size to the prior study. Right frontal parietal convexity subdural fluid is mixed density and measures approximately 6 mm unchanged from the prior study. Decreased subdural gas since the prior study. Moderate ventricular enlargement unchanged. No acute infarct.  No new hemorrhage.  No midline shift. Vascular: Negative for hyperdense vessel Skull: Right frontal craniotomy.  Craniotomy flap in good position. Sinuses/Orbits: Mucoperiosteal thickening right maxillary sinus. Air-fluid level right maxillary sinus. Other: None IMPRESSION: Postop right craniotomy for subdural hematoma drainage. Residual subdural fluid on the right shows progressive decrease in density and is similar in size to the prior CT Moderate ventricular enlargement stable. No new hemorrhage. Electronically Signed   By: Franchot Gallo M.D.   On: 04/28/2019 13:12   Ct Head Wo Contrast  Result Date: 04/21/2019 CLINICAL DATA:  Subdural drainage EXAM: CT HEAD WITHOUT  CONTRAST TECHNIQUE: Contiguous axial images were obtained from the base of the skull through the vertex without intravenous contrast. COMPARISON:  MRI head 04/17/2019 FINDINGS: Brain: Interval right-sided craniotomy and drainage of subdural hematoma. Subdural drain in place. Mixed density high and low-density subdural hematoma on the right is significantly smaller now measuring approximately 4.5 mm in diameter. Subdural gas on the right. Improvement in midline shift now approximately 3 mm. Negative for acute infarct Generalized atrophy with ventricular enlargement. White matter ischemic changes appear chronic. Vascular: Negative for hyperdense vessel Skull: Right-sided craniotomy.  Craniotomy flap in good position. Sinuses/Orbits: Air-fluid level right maxillary sinus. Other: None IMPRESSION: Interval right craniotomy for drainage of subdural hematoma. Decrease in size of mixed density subdural hematoma. Improvement in midline shift now 3 mm. Electronically Signed   By: Franchot Gallo M.D.   On: 04/21/2019 09:04   Mr Jeri Cos [id number] Contrast  Result Date: 04/17/2019 CLINICAL DATA:  Bilateral arm and leg weakness for 2 weeks. Confusion. EXAM: MRI HEAD WITHOUT AND WITH CONTRAST TECHNIQUE: Multiplanar, multiecho pulse sequences of the brain and surrounding structures were obtained without and with intravenous contrast. CONTRAST:  39mL MULTIHANCE GADOBENATE DIMEGLUMINE 529 MG/ML IV SOLN COMPARISON:  Head CT 06/12/2013 and MRI 06/06/2013 FINDINGS: Brain: A large, heterogeneously T1 and T2 hyperintense subdural hematoma over the right cerebral convexity measures up to 2.1 cm in thickness. Associated smooth dural enhancement is likely reactive. There is right cerebral hemispheric sulcal effacement with mass effect on the right lateral ventricle and 7 mm of leftward midline shift. Small foci of T2 hyperintensity in the cerebral white matter bilaterally are nonspecific but compatible with mild chronic small vessel ischemic  disease, slightly progressed from 2014. Multiple small chronic cerebellar infarcts are again seen. There is moderate cerebral atrophy. No acute infarct is evident. Vascular: Major intracranial vascular  flow voids are preserved. Skull and upper cervical spine: Unremarkable bone marrow signal. Sinuses/Orbits: Bilateral cataract extraction. Small right maxillary sinus with mild mucosal thickening and/or small volume fluid. Clear mastoid air cells. Other: None. IMPRESSION: 1. Large right cerebral convexity subdural hematoma with 7 mm of leftward midline shift. 2. Mild chronic small vessel ischemic disease. 3. Chronic cerebellar infarcts. Critical Value/emergent results were called by telephone at the time of interpretation on 04/17/2019 at 5:57 p.m. to Dr. Derrel Nip (on-call for Dr. Quay Burow), who verbally acknowledged these results and asked that the patient be directed to the [facility] Emergency Department for further evaluation. Electronically Signed   By: Logan Bores M.D.   On: 04/17/2019 18:12    Labs:  Basic Metabolic Panel: Recent Labs  Lab 05/01/19 0523 05/04/19 0521  NA 138 137  K 4.0 3.8  CL 101 102  CO2 29 26  GLUCOSE 97 91  BUN 26* 26*  CREATININE 1.50* 1.43*  CALCIUM 8.9 8.8*    CBC: Recent Labs  Lab 05/01/19 0523  WBC 7.3  NEUTROABS 4.2  HGB 10.3*  HCT 31.8*  MCV 97.2  PLT 272    CBG: No results for input(s): GLUCAP in the last 168 hours.  Family history.  Father with history of CVA.  Sister with CVA.  Mother with CAD.  Maternal grandmother with diabetes.  Denies colon cancer  Brief HPI:   Benjamin Hahn is a 85 y.o. right-handed male with history of BPH, hypertension, hyperlipidemia and occasional alcohol.  Lives with spouse 2 level home main level bedroom.  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  Presented 04/17/2019 with reported multiple falls in the past 2 months no loss of consciousness as well as left hemiparesis.  MRI showed a large right cerebral  convexity subdural hematoma with a 7 mm leftward midline shift identified.  Underwent right frontoparietal temporal craniotomy and evacuation of subdural hematoma 04/19/2019 per Dr. [name].  A follow-up CT of the brain 04/21/2019 showed decrease in size of subdural improvement midline shift and Jackson-Pratt drain was removed.  Hospital course urinary retention with documented history of BPH bladder scan 682 cc with a 16 French coud catheter placed and plans to continue Foley catheter tube no follow-up urology services per Dr. Gloriann Loan.  Patient has seen Dr. [name] in the past.  Patient remained on Flomax.  Tolerating a regular diet.  Patient was admitted for a comprehensive rehab program.     Hospital Course: Benjamin Hahn was admitted to rehab 04/21/2019 for inpatient therapies to consist of PT, ST and OT at least three hours five days a week. Past admission physiatrist, therapy team and rehab RN have worked together to provide customized collaborative inpatient rehab.  In regards to patient's traumatic subacute and chronic subdural hematoma with multiple falls.  Status post right frontoparietal temporal craniotomy evacuation of subdural hematoma 04/19/2019.  Patient followed closely by neurosurgery Dr. [name].  Latest CT of the head 04/28/2019 showed no new hemorrhage.  His staples have been removed.  DVT prophylaxis with SCDs.  Pain management use the hydrocodone as well as Lidoderm patch.  Mood stabilization with Wellbutrin, Tofranil 10 mg daily, Effexor 37.5 mg daily.  Patient cooperative attending therapies.  Blood pressure control with low-dose Toprol and would follow-up with primary MD.  Lipitor ongoing for hyperlipidemia.  Noted history of BPH with urinary retention patient's post void residual scans initially had been elevated leading to Foley catheter tube 16 [place] coud had been placed recommendations and  recommendations made to follow-up outpatient urology for voiding trial.  Contact me to  urology prior to departure from the hospital recommendations are to discontinue Foley tube and this was also at family's request for tube to be removed and patient can still follow-up urology as needed as he had seen Dr. [name] in the past   Blood pressures were monitored on TID basis and monitored    He is continent of bowel.  Foley tube removed.  He has made gains during rehab stay and is attending therapies  He will continue to receive follow up therapies   after discharge  Rehab course: During patient's stay in rehab weekly team conferences were held to monitor patient's progress, set goals and discuss barriers to discharge. At admission, patient required minimal assist 90 feet rolling walker, moderate assist stand pivot transfers, minimal assist sit to stand, minimal assist sit to supine.  Set up upper body bathing minimal guard lower body bathing set up upper body dressing minimal guard lower body dressing minimal guard toilet transfers  Physical exam.  Blood pressure 82/59 pulse 68 temperature 97 respiration 21 oxygen saturation 97% room air Constitutional.  Oriented person place no distress HEENT Mouth/throat.  Oropharynx is clear moist no oropharyngeal exudate right craniotomy surgical site clean and dry with staples Eyes pupils round and reactive to light no discharge no nystagmus Neck.  Supple nontender no tracheal deviation no thyromegaly Cardiovascular normal rate and rhythm no friction rub or murmur heard Respiratory.  Effort normal breath sounds normal no respiratory distress no wheezes GI.  Soft exhibits no distention nontender without rebound Genitourinary indwelling Foley catheter tube in place Neurological alert and oriented to person and place sitting up in bed makes good eye contact with examiner had some left inattention moves all fours mild left pronator drift motor 4 out of 5 to all 4 no focal sensory findings  He  has had improvement in activity tolerance,  balance, postural control as well as ability to compensate for deficits. He has had improvement in functional use RUE/LUE  and RLE/LLE as well as improvement in awareness.  Working with energy conservation techniques.  Ambulates to and from therapy gym with close supervision using a Rollator verbal cues for gait pattern.  Attempted to practice floor transfers however patient unable to safely get onto the floor from mat despite max assist from therapist.  ADLs patient and wife educated patient stood from recliner with increased time contact-guard assist for anterior shift for ADLs ambulates to the therapy gym with Rollator close supervision with Occupational Therapy occupational therapy issued home fine motor program and worked on fine motor task including shuffling dealing cards picking up small objects handwriting.  Speech therapy follow-up focused on cognitive goals facilitated sessions by administering the communications activities of daily living assessment.  Patient required extra time to complete task but scored within functional limits in all subtest.  Full family teaching completed plan discharge to home       Disposition: Discharge disposition: 01-Home or Self Care     Discharge to home   Diet: Regular  Special Instructions: No driving smoking or alcohol  Follow-up urology services as needed for history of BPH urinary retention  Medications at discharge. 1.  Tylenol as needed 2.  Xanax 0.5 mg nightly as needed 3.  Lipitor 40 mg p.o. daily 4.  Wellbutrin 150 mg p.o. daily 5.  Hydrocodone 1 to 2 tablets every 4 hours as needed pain 6.  Tofranil 10 mg p.o. daily 7.  Synthroid 88 mcg p.o. daily 8.  Lidoderm patch change as directed 9.  Toprol XL 12.5 mg p.o. daily 10.  Nitroglycerin as needed chest pain 11.  Flomax 0.4 mg p.o. daily 12.  Effexor 37.5 mg p.o. daily  Discharge Instructions    Ambulatory referral to Physical Medicine Rehab   Complete by: As directed    Moderate  complexity follow-up 1 to 2 weeks traumatic subdural hematoma      Follow-up Information    Meredith Staggers, MD Follow up.   Specialty: Physical Medicine and Rehabilitation Why: Office to call for appointment Contact information: [address] [place] [address] [phone]        Jovita Gamma, MD Follow up.   Specialty: Neurosurgery Why: Call for appointment Contact information: [address] [place] [address] [phone]        Lucas Mallow, MD Follow up.   Specialty: Urology Why: Call for appointment Contact information: [place] [address] [phone]           Signed: Lavon Paganini [place] 05/05/2019, 5:24 AM

## 2019-05-04 NOTE — Progress Notes (Signed)
Physical Therapy Session Note  Patient Details  Name: Benjamin Hahn MRN: [id number] Date of Birth: June 17, 1933  Today's Date: 05/04/2019 PT Individual Time: 0900-1000 PT Individual Time Calculation (min): 60 min   Short Term Goals: Week 2:  PT Short Term Goal 1 (Week 2): =LTGs due to ELOS  Skilled Therapeutic Interventions/Progress Updates:   Pt in recliner and agreeable to therapy, denies pain. Donned TEDs w/ total assist and shoes w/ set-up assist. Ambulated to/from therapy gym w/ close supervision using rollator, verbal cues for gait pattern and increased upright posture. Attempted to practice floor transfer, however pt unable to safely get onto floor from mat despite max assist from therapist. Deferred practicing and demo-ed technique for pt and will provide written handout for wife's reference and review w/ wife. Instructed and practiced HEP in standing and supine including standing partial knee bends, knee marches, hip abduction and hip extension; supine bridge, supine knee-to-chest alternating, and hooklying stretch to each side. Provided pt w/ written handout and pt demo-ed correctly and safely. Returned to room and ended session in recliner, all needs in reach.   Therapy Documentation Precautions:  Precautions Precautions: Fall Precaution Comments: Rt crani Restrictions Weight Bearing Restrictions: No Pain: Pain Assessment Pain Scale: 0-10 Pain Score: 0-No pain   Therapy/Group: Individual Therapy  [name] 05/04/2019, 10:05 AM

## 2019-05-04 NOTE — Progress Notes (Signed)
[facility] PHYSICAL MEDICINE & REHABILITATION PROGRESS NOTE   Subjective/Complaints:  Working with SLP. No new problems. Denies pain. Moved bowels again yesterday  ROS: Patient denies fever, rash, sore throat, blurred vision, nausea, vomiting, diarrhea, cough, shortness of breath or chest pain, joint or back pain, headache, or mood change.   Objective:   No results found. No results for input(s): WBC, HGB, HCT, PLT in the last 72 hours. Recent Labs    05/04/19 0521  NA 137  K 3.8  CL 102  CO2 26  GLUCOSE 91  BUN 26*  CREATININE 1.43*  CALCIUM 8.8*    Intake/Output Summary (Last 24 hours) at 05/04/2019 0959 Last data filed at 05/04/2019 0811 Gross per 24 hour  Intake 722 ml  Output 1551 ml  Net -829 ml     Physical Exam: Vital Signs Blood pressure 110/63, pulse 65, temperature 97.7 F (36.5 C), temperature source Oral, resp. rate 18, height 5' 7.5" (1.715 m), weight 87.1 kg, SpO2 96 %.  Constitutional: No distress . Vital signs reviewed. HEENT: EOMI, oral membranes moist Neck: supple Cardiovascular: RRR without murmur. No JVD    Respiratory: CTA Bilaterally without wheezes or rales. Normal effort    GI: BS +, non-tender, non-distended   Genitourinary:  Indwelling Foley catheter tube in place--urine remains straw colored Musculoskeletal:  General: No deformityor edema.  Neurological: alert. Normal language. Follows commands. improved insight. Mild left inattention. Moves all 4 limbs. Mild left pronator drift which is improving. . Motor 4/5 all 4. No focal sensory findings.    Skin: scalp wound CDI Psychiatric: pleasant   Assessment/Plan: 1. Functional deficits secondary to right SDH after fall which require 3+ hours per day of interdisciplinary therapy in a comprehensive inpatient rehab setting.  Physiatrist is providing close team supervision and 24 hour management of active medical problems listed below.  Physiatrist and rehab team continue to  assess barriers to discharge/monitor patient progress toward functional and medical goals  Care Tool:  Bathing    Body parts bathed by patient: Right arm, Left arm, Chest, Abdomen, Buttocks, Front perineal area, Right upper leg, Left upper leg, Right lower leg, Left lower leg, Face         Bathing assist Assist Level: Minimal Assistance - Patient > 75%     Upper Body Dressing/Undressing Upper body dressing   What is the patient wearing?: Pull over shirt    Upper body assist Assist Level: Contact Guard/Touching assist    Lower Body Dressing/Undressing Lower body dressing      What is the patient wearing?: Underwear/pull up, Pants     Lower body assist Assist for lower body dressing: Minimal Assistance - Patient > 75%     Toileting Toileting    Toileting assist Assist for toileting: Supervision/Verbal cueing     Transfers Chair/bed transfer  Transfers assist     Chair/bed transfer assist level: Contact Guard/Touching assist     Locomotion Ambulation   Ambulation assist      Assist level: Contact Guard/Touching assist Assistive device: Rollator Max distance: 150'   Walk 10 feet activity   Assist     Assist level: Contact Guard/Touching assist Assistive device: Rollator   Walk 50 feet activity   Assist    Assist level: Contact Guard/Touching assist Assistive device: Rollator    Walk 150 feet activity   Assist Walk 150 feet activity did not occur: Safety/medical concerns  Assist level: Contact Guard/Touching assist Assistive device: Rollator    Walk 10 feet on  uneven surface  activity   Assist Walk 10 feet on uneven surfaces activity did not occur: Safety/medical concerns         Wheelchair     Assist Will patient use wheelchair at discharge?: Yes Type of Wheelchair: Manual    Wheelchair assist level: Supervision/Verbal cueing Max wheelchair distance: 78'    Wheelchair 50 feet with 2 turns activity    Assist         Assist Level: Supervision/Verbal cueing   Wheelchair 150 feet activity     Assist  Wheelchair 150 feet activity did not occur: Safety/medical concerns   Assist Level: Supervision/Verbal cueing   Blood pressure 110/63, pulse 65, temperature 97.7 F (36.5 C), temperature source Oral, resp. rate 18, height 5' 7.5" (1.715 m), weight 87.1 kg, SpO2 96 %.  Medical Problem List and Plan: 1.Decreased functional mobility with left side weaknesssecondary to right hemispheric traumatic subacute and chronic subdural hematoma with multiple falls. Status post right frontoparietal temporal craniotomy evacuation of subdural hematoma 04/19/2019  -ELOS 9/12--finalize dc planning --Continue CIR therapies including PT, OT, and SLP   2. Antithrombotics: -DVT/anticoagulation:SCDs -antiplatelet therapy: N/A 3. Pain Management:Hydrocodone as needed 4. Mood:Tofranil 10   2000 nightly per pt request  - Wellbutrin 150 mg daily  -Effexor 37.5 mg daily  - Xanax 0.5 mg nightly as needed -antipsychotic agents: N/A 5. Neuropsych: This patientiscapable of making decisions on hisown behalf. 6. Skin/Wound Care:Routine skin checks  -staples out 7. Fluids/Electrolytes/Nutrition:reasonable intake    -push fluids   .f/u bmet today with stable to imprved Cr 1.43. has baseline renal deficiency 8. Hypertension. Toprol 12.5 mg daily, Altace 1.25 mg daily.   - Vitals:   05/03/19 1309 05/04/19 0533  BP: (!) 101/53 110/63  Pulse: 84 65  Resp: 20 18  Temp: 98.1 F (36.7 C) 97.7 F (36.5 C)  SpO2: 93% 96%  improved control 9/10  -continue to push fluids  -held altace   -reduced flomax to 0.4mg  9. BPH. Noted urinary retention. 16 French coud catheter placed. Patient followed by urology services Dr Gloriann Loan. -Plan is currently to keep Foley tube until follow up office. -Flomax reduced to 0.4 mg daily d/t hypotension 10.  Hypothyroidism. Synthroid 11. Constipation: + results with sorbitol  -continue daily senna-s    LOS: 13 days A FACE TO FACE EVALUATION WAS PERFORMED  Meredith Staggers 05/04/2019, 9:59 AM

## 2019-05-04 NOTE — Progress Notes (Signed)
Speech Language Pathology Daily Session Note  Patient Details  Name: Benjamin Hahn MRN: [id number] Date of Birth: 06-01-1933  Today's Date: 05/04/2019 SLP Individual Time: 0800-0900 SLP Individual Time Calculation (min): 60 min  Short Term Goals: Week 2: SLP Short Term Goal 1 (Week 2): STG=LTG due to ELOS  Skilled Therapeutic Interventions: Skilled treatment session focused on cognitive goals. SLP facilitated session by administering the Communication Activities of Daily Living (CADL) assessment. Patient required extra time to complete tasks but scored WFL in all subtests. Patient also independently utilized his schedule to anticipate upcoming therapy appointments. Patient left upright in the recliner with alarm on and all needs within reach. Continue with current plan of care.      Pain No/Denies Pain   Therapy/Group: Individual Therapy  Laterra Lubinski 05/04/2019, 3:22 PM

## 2019-05-04 NOTE — Progress Notes (Signed)
Occupational Therapy Session Note  Patient Details  Name: Benjamin Hahn MRN: [id number] Date of Birth: [birth year]  Today's Date: 05/04/2019 OT Individual Time: 1300-1415 OT Individual Time Calculation (min): 75 min    Short Term Goals: Week 2:  OT Short Term Goal 1 (Week 2): LTG=STG 2/2 ELOS  Skilled Therapeutic Interventions/Progress Updates:   Pt greeted seated in recliner after lunch with spouse present and needs met. Pt's spouse with lots of questions regarding how dc will work, foley care, pt prognosis, and when pt can return to work. OT educated on brain injury and importance of focus on healing as main priority right now. Pt stated he should talk to his "Professional Liability Insurance" for guidance on when to return to work as well. Reviewed pt's high fall risk and need for 24/7 supervision. Also educated on dc process/paperwork, and told her nursing would be going over foley care with her tomorrow. Pt's spouse to be present for all 3 therapies tomorrow for continued education. OT educated on need for pt to be able to manage foley prior to transfers or ambulation. Pt placed foley catheter bag in rollator seat. Pt stood from recliner with increased time and CGA for anterior weight shift 2/2 posterior bias. Pt ambulated to therapy gym with rollator and close supervision. Focus on increased step length with R LE. Pt given medium soft red theraputty and went through theraputty exercises. OT then issued home fine motor program and worked on fine motor tasks including shuffling/dealing cards, picking up small objects, handwriting, etc. UB there-ex and balance with standing bicep curls, straight arm raises, and chest pull. Pt ambulated back to room and left seated in recliner chair with alarm on and needs met.   Therapy Documentation Precautions:  Precautions Precautions: Fall Precaution Comments: Rt crani Restrictions Weight Bearing Restrictions: No Pain:  none/denies  pain  Therapy/Group: Individual Therapy  Valma Cava 05/04/2019, 1:33 PM

## 2019-05-04 NOTE — Plan of Care (Signed)
  Problem: Consults Goal: RH BRAIN INJURY PATIENT EDUCATION Description: Description: See Patient Education module for eduction specifics Outcome: Progressing Goal: Skin Care Protocol Initiated - if Braden Score 18 or less Description: If consults are not indicated, leave blank or document N/A Outcome: Progressing   Problem: RH BLADDER ELIMINATION Goal: RH STG MANAGE BLADDER WITH ASSISTANCE Description: STG Manage Bladder With cues and reminders Assistance Outcome: Progressing Goal: RH STG MANAGE BLADDER WITH EQUIPMENT WITH ASSISTANCE Description: STG Manage Bladder With Equipment With cues and reminder assist Outcome: Progressing   Problem: RH SAFETY Goal: RH STG ADHERE TO SAFETY PRECAUTIONS W/ASSISTANCE/DEVICE Description: STG Adhere to Safety Precautions With supervision Assistance/Device. Outcome: Progressing   Problem: RH COGNITION-NURSING Goal: RH STG ANTICIPATES NEEDS/CALLS FOR ASSIST W/ASSIST/CUES Description: STG Anticipates Needs/Calls for Assist With mod I Assistance/Cues. Outcome: Progressing   Problem: RH PAIN MANAGEMENT Goal: RH STG PAIN MANAGED AT OR BELOW PT'S PAIN GOAL Description: < 3 Outcome: Progressing   Problem: RH KNOWLEDGE DEFICIT BRAIN INJURY Goal: RH STG INCREASE KNOWLEDGE OF SELF CARE AFTER BRAIN INJURY Description: Patient/caregiver will verbalize/demonstrate understanding of brain injury recovery as directed by care team with mod I assist. Outcome: Progressing

## 2019-05-05 ENCOUNTER — Encounter (HOSPITAL_COMMUNITY): Payer: Medicare Other | Admitting: Speech Pathology

## 2019-05-05 ENCOUNTER — Ambulatory Visit (HOSPITAL_COMMUNITY): Payer: Medicare Other | Admitting: Physical Therapy

## 2019-05-05 ENCOUNTER — Encounter (HOSPITAL_COMMUNITY): Payer: Medicare Other | Admitting: Occupational Therapy

## 2019-05-05 MED ORDER — LIDOCAINE 5 % EX PTCH: 1.0000 | MEDICATED_PATCH | CUTANEOUS | 0 refills | Status: DC

## 2019-05-05 MED ORDER — VENLAFAXINE HCL ER 37.5 MG PO CP24: 37.5000 mg | ORAL_CAPSULE | Freq: Every day | ORAL | 6 refills | Status: DC

## 2019-05-05 MED ORDER — ALPRAZOLAM 0.5 MG PO TABS: 0.5000 mg | ORAL_TABLET | Freq: Every evening | ORAL | 0 refills | Status: DC | PRN

## 2019-05-05 MED ORDER — ACETAMINOPHEN 325 MG PO TABS: 325.0000 mg | ORAL_TABLET | ORAL | Status: DC | PRN

## 2019-05-05 MED ORDER — TAMSULOSIN HCL 0.4 MG PO CAPS: 0.4000 mg | ORAL_CAPSULE | Freq: Every day | ORAL | 0 refills | Status: DC

## 2019-05-05 MED ORDER — LEVOTHYROXINE SODIUM 88 MCG PO TABS: 88.0000 ug | ORAL_TABLET | Freq: Every day | ORAL | 0 refills | Status: DC

## 2019-05-05 MED ORDER — METOPROLOL SUCCINATE ER 25 MG PO TB24: 12.5000 mg | ORAL_TABLET | Freq: Every day | ORAL | 0 refills | Status: DC

## 2019-05-05 MED ORDER — BUPROPION HCL ER (XL) 150 MG PO TB24: 150.0000 mg | ORAL_TABLET | Freq: Every day | ORAL | 2 refills | Status: DC

## 2019-05-05 MED ORDER — IMIPRAMINE HCL 10 MG PO TABS: ORAL_TABLET | ORAL | 5 refills | Status: DC

## 2019-05-05 MED ORDER — HYDROCODONE-ACETAMINOPHEN 5-325 MG PO TABS: 1.0000 | ORAL_TABLET | ORAL | 0 refills | Status: DC | PRN

## 2019-05-05 MED ORDER — ATORVASTATIN CALCIUM 40 MG PO TABS: 40.0000 mg | ORAL_TABLET | Freq: Every day | ORAL | 3 refills | Status: DC

## 2019-05-05 NOTE — Plan of Care (Signed)
  Problem: RH Balance Goal: LTG: Patient will maintain dynamic sitting balance (OT) Description: LTG:  Patient will maintain dynamic sitting balance with assistance during activities of daily living (OT) Outcome: Completed/Met Goal: LTG Patient will maintain dynamic standing with ADLs (OT) Description: LTG:  Patient will maintain dynamic standing balance with assist during activities of daily living (OT)  Outcome: Completed/Met   Problem: Sit to Stand Goal: LTG:  Patient will perform sit to stand in prep for activites of daily living with assistance level (OT) Description: LTG:  Patient will perform sit to stand in prep for activites of daily living with assistance level (OT) Outcome: Completed/Met   Problem: RH Eating Goal: LTG Patient will perform eating w/assist, cues/equip (OT) Description: LTG: Patient will perform eating with assist, with/without cues using equipment (OT) Outcome: Completed/Met   Problem: RH Grooming Goal: LTG Patient will perform grooming w/assist,cues/equip (OT) Description: LTG: Patient will perform grooming with assist, with/without cues using equipment (OT) Outcome: Completed/Met   Problem: RH Bathing Goal: LTG Patient will bathe all body parts with assist levels (OT) Description: LTG: Patient will bathe all body parts with assist levels (OT) Outcome: Completed/Met   Problem: RH Dressing Goal: LTG Patient will perform upper body dressing (OT) Description: LTG Patient will perform upper body dressing with assist, with/without cues (OT). Outcome: Completed/Met Goal: LTG Patient will perform lower body dressing w/assist (OT) Description: LTG: Patient will perform lower body dressing with assist, with/without cues in positioning using equipment (OT) Outcome: Completed/Met   Problem: RH Toileting Goal: LTG Patient will perform toileting task (3/3 steps) with assistance level (OT) Description: LTG: Patient will perform toileting task (3/3 steps) with  assistance level (OT)  Outcome: Completed/Met   Problem: RH Toilet Transfers Goal: LTG Patient will perform toilet transfers w/assist (OT) Description: LTG: Patient will perform toilet transfers with assist, with/without cues using equipment (OT) Outcome: Completed/Met   Problem: RH Tub/Shower Transfers Goal: LTG Patient will perform tub/shower transfers w/assist (OT) Description: LTG: Patient will perform tub/shower transfers with assist, with/without cues using equipment (OT) Outcome: Completed/Met

## 2019-05-05 NOTE — Progress Notes (Signed)
Social Work Discharge Note   The overall goal for the admission was met for:   Discharge location: Yes - pt returning home with spouse who can provide 24/7 supervision  Length of Stay: Yes - 15 days (with discharge on 05/06/19)  Discharge activity level: Yes - supervision  Home/community participation: Yes  Services provided included: MD, RD, PT, OT, SLP, RN, TR, Pharmacy, Neuropsych and SW  Financial Services: Medicare and Private Insurance: [place]  Follow-up services arranged: Outpatient: PT, OT, ST via [facility], DME: 3n1 commode via [place] and Patient/Family has no preference for HH/DME agencies  Comments (or additional information):   Contact info:  Raechel Chute, Cesare Sumlin @ [phone]  Patient/Family verbalized understanding of follow-up arrangements: Yes  Individual responsible for coordination of the follow-up plan: pt  Confirmed correct DME delivered: Lashina Milles 05/05/2019    Dvontae Ruan

## 2019-05-05 NOTE — Progress Notes (Signed)
Speech Language Pathology Discharge Summary  Patient Details  Name: Benjamin Hahn MRN: [id number] Date of Birth: Aug 31, 1932  Today's Date: 05/05/2019 SLP Individual Time: 1000-1100 SLP Individual Time Calculation (min): 60 min   Skilled Therapeutic Interventions:  Skilled treatment session focused on completion of patient and family education with the patient and his wife. SLP facilitated session by providing education in regards to the patient's current cognitive deficits and strategies to utilize to maximize problem solving and recall at home. Both verbalized understanding of all information and handouts were also given to reinforce information. Patient left upright recliner with wife present. Continue with current plan of care.    Patient has met 2 of 2 long term goals.  Patient to discharge at overall Supervision level.   Reasons goals not met: N/A   Clinical Impression/Discharge Summary: Patient has made functional gains and has met 2 of 2 LTGs this admission. Currently, patient demonstrates behaviors consistent with a Rancho Level VIII and requires overall supervision level verbal cues to complete functional and mildly complex tasks safely in regards to recall and problem solving. Patient and family education is complete and patient will discharge home with 24 hour supervision from wife. Patient would benefit from f/u outpatient SLP services to maximize his cognitive functioning and overall functional independence.   Care Partner:  Caregiver Able to Provide Assistance: Yes  Type of Caregiver Assistance: Physical;Cognitive  Recommendation:  Outpatient SLP;24 hour supervision/assistance  Rationale for SLP Follow Up: Maximize cognitive function and independence;Reduce caregiver burden   Equipment: N/A  Reasons for discharge: Discharged from hospital;Treatment goals met   Patient/Family Agrees with Progress Made and Goals Achieved: Yes    Selin Eisler 05/05/2019, 12:25  PM

## 2019-05-05 NOTE — Discharge Instructions (Signed)
Inpatient Rehab Discharge Instructions  [facility] Discharge date and time: No discharge date for patient encounter.   Activities/Precautions/ Functional Status: Activity: activity as tolerated Diet: regular diet Wound Care: keep wound clean and dry Functional status:  ___ No restrictions     ___ Walk up steps independently ___ 24/7 supervision/assistance   ___ Walk up steps with assistance ___ Intermittent supervision/assistance  ___ Bathe/dress independently ___ Walk with walker     _x__ Bathe/dress with assistance ___ Walk Independently    ___ Shower independently ___ Walk with assistance    ___ Shower with assistance ___ No alcohol     ___ Return to work/school ________     COMMUNITY REFERRALS UPON DISCHARGE:    Outpatient: PT     OT    ST                   Agency:  [facility]    Phone: [phone]                Appointment Date/Time:  9/14 @ 1:15 pm (PT) and 2:00 pm (OT) - please arrive by 1:00 for check-in                                                                   9/17 @ 11:00 am (Speech)  Medical Equipment/Items Ordered:  Commode                                                      Agency/Supplier:  [facility] @ [phone]      Special Instructions: No driving smoking or alcohol  No aspirin products   My questions have been answered and I understand these instructions. I will adhere to these goals and the provided educational materials after my discharge from the hospital.  Patient/Caregiver Signature _______________________________ Date __________  Clinician Signature _______________________________________ Date __________  Please bring this form and your medication list with you to all your follow-up doctor's appointments.

## 2019-05-05 NOTE — Progress Notes (Signed)
Physical Therapy Discharge Summary  Patient Details  Name: Benjamin Hahn MRN: [id number] Date of Birth: 1933-04-12  Today's Date: 05/05/2019 PT Individual Time: 1100-1156 PT Individual Time Calculation (min): 56 min   Pt in recliner and agreeable to therapy, no c/o pain. Wife present for caregiver education this session. Performed functional mobility as detailed below including gait in household and community environments, bed mobility, furniture transfers, and stair negotiation. Additionally performed car transfer w/ supervision. Educated both on pt's ongoing balance deficits, importance of 24/7 supervision for all mobility and CGA for community mobility 2/2 pt's high distractibility, high fall risk, and endurance deficits. Educated wife on pt's Berg Balance score on 9/8 of 27/56, including significance of results. Wife appropriately providing CGA for community environment gait. Recommend pt not negotiate any stairs at this time (w/ exception of small threshold into house), including to enter office, 2/2 balance deficits. Both in agreement to consult outpatient PT prior to attempting to negotiate stairs or go back to pt's office. Educated wife on how to cue to pt to get up from floor should he fall, cautioned to call EMS prior to her attempting to physically help her or if he hit his head. Discussed pt's HEP and importance of close supervision w/ standing exercises. Both verbalized understanding and in agreement w/ all education and recommendations. Returned to room and ended session in recliner and in care of wife, all needs met.    Patient has met 9 of 9 long term goals due to improved activity tolerance, improved balance, improved postural control, increased strength, ability to compensate for deficits, improved attention, improved awareness and improved coordination.  Patient to discharge at an ambulatory level Supervision.   Patient's care partner is independent to provide the necessary  physical assistance at discharge.  Reasons goals not met: n/a  Recommendation:  Patient will benefit from ongoing skilled PT services in outpatient setting to continue to advance safe functional mobility, address ongoing impairments in functional balance, postural control, endurance, and global strength, and minimize fall risk.  Equipment: No equipment provided - pt already has rollator at home   Reasons for discharge: treatment goals met and discharge from hospital  Patient/family agrees with progress made and goals achieved: Yes  PT Discharge Precautions/Restrictions Precautions Precautions: None Restrictions Weight Bearing Restrictions: No Cognition Orientation Level: Oriented X4 Sensation Sensation Light Touch: Appears Intact Coordination Heel Shin Test: WNL bilaterally Motor  Motor Motor: Abnormal postural alignment and control Motor - Discharge Observations: posterior lean bias  Mobility Bed Mobility Bed Mobility: Rolling Right;Rolling Left;Supine to Sit;Sit to Supine Rolling Right: Supervision/verbal cueing Rolling Left: Supervision/Verbal cueing Supine to Sit: Supervision/Verbal cueing Sit to Supine: Supervision/Verbal cueing Transfers Transfers: Stand to Sit;Sit to Stand;Stand Pivot Transfers Sit to Stand: Supervision/Verbal cueing Stand to Sit: Supervision/Verbal cueing Stand Pivot Transfers: Supervision/Verbal cueing Transfer (Assistive device): None Locomotion  Gait Ambulation: Yes Gait Assistance: Supervision/Verbal cueing Gait Distance (Feet): 150 Feet Assistive device: Rollator Gait Assistance Details: Verbal cues for safe use of DME/AE;Verbal cues for precautions/safety Gait Gait: Yes Gait Pattern: Impaired Gait Pattern: Shuffle;Narrow base of support;Poor foot clearance - left;Poor foot clearance - right;Trunk flexed Gait velocity: decreased, gait pattern improves w/ increased walking speed, however pt unable to maintain for more than a few sec  at a time Stairs / Additional Locomotion Stairs: Yes Stairs Assistance: Contact Guard/Touching assist Stair Management Technique: One rail Right Number of Stairs: 4 Height of Stairs: 6 Ramp: Supervision/Verbal cueing Curb: Minimal Assistance - Patient >75%(when using rollator) Wheelchair Mobility  Wheelchair Mobility: No  Trunk/Postural Assessment  Cervical Assessment Cervical Assessment: Exceptions to WFL(forward head, unable to reach neutral extension) Thoracic Assessment Thoracic Assessment: Exceptions to WFL(kyphotic) Lumbar Assessment Lumbar Assessment: Exceptions to WFL(posterior pelvic tilt, unable to reach neutral extension) Postural Control Postural Control: Deficits on evaluation(delayed, posterior lean bias)  Balance Balance Balance Assessed: Yes Dynamic Sitting Balance Dynamic Sitting - Balance Support: During functional activity;Feet supported;No upper extremity supported Dynamic Sitting - Level of Assistance: 6: Modified independent (Device/Increase time) Dynamic Standing Balance Dynamic Standing - Balance Support: Bilateral upper extremity supported;During functional activity Dynamic Standing - Level of Assistance: 5: Stand by assistance Extremity Assessment  RLE Assessment RLE Assessment: Within Functional Limits Passive Range of Motion (PROM) Comments: WFL General Strength Comments: Globally 4/5 LLE Assessment LLE Assessment: Within Functional Limits Passive Range of Motion (PROM) Comments: [facility] General Strength Comments: Globally 4/5    Tykwon Fera K Mashal Slavick 05/05/2019, 11:28 AM

## 2019-05-05 NOTE — Progress Notes (Signed)
Patients wife called multiple times to discuss her apprehension in taking the patient home with a foley catheter in place.  PA, MD, and RN attempted to explain why he needed it and the need for her to learn intermittent caths if patient unable to void once catheter does come out.  Wife to come in today at 60 for family education.  Dr. Naaman Plummer to discuss with patient and spouse upon arrival today.  She even mentioned that she would not be able/willing to take him home with a catheter present.  MD aware of conversation.  Brita Romp, RN

## 2019-05-05 NOTE — Progress Notes (Signed)
Recreational Therapy Discharge Summary Patient Details  Name: DAEMON DOWTY MRN: [id number] Date of Birth: 1933-05-16 Today's Date: 05/05/2019  Long term goals set: 1  Long term goals met: 1  Comments on progress toward goals: Pt has made good progress during LOS and is ready for discharge home with wife to provide/coordinate 24 hour supervision.  TR sessions focused on leisure education, activity analysis with potential modifications and community reintegration.  Pt participated in simulated community skills obstacle course stepping on and over various obstacles and surface types using rollator with contact guard assist level, min instructional cues for safety.  Goal met.  Reasons for discharge: discharge from hospital Patient/family agrees with progress made and goals achieved: Yes  Tyson Parkison 05/05/2019, 10:06 AM

## 2019-05-05 NOTE — Progress Notes (Signed)
Foley removed per MD request.  Patient tolerated well.  Education provided regarding voiding trial, infection risk, and potential outcomes.  Wife aware of removal as she was on the phone with him at that time.  Brita Romp, RN

## 2019-05-05 NOTE — Plan of Care (Signed)
  Problem: Consults Goal: RH BRAIN INJURY PATIENT EDUCATION Description: Description: See Patient Education module for eduction specifics Outcome: Progressing Goal: Skin Care Protocol Initiated - if Braden Score 18 or less Description: If consults are not indicated, leave blank or document N/A Outcome: Progressing   Problem: RH BLADDER ELIMINATION Goal: RH STG MANAGE BLADDER WITH ASSISTANCE Description: STG Manage Bladder With cues and reminders Assistance Outcome: Progressing Goal: RH STG MANAGE BLADDER WITH EQUIPMENT WITH ASSISTANCE Description: STG Manage Bladder With Equipment With cues and reminder assist Outcome: Progressing   Problem: RH SAFETY Goal: RH STG ADHERE TO SAFETY PRECAUTIONS W/ASSISTANCE/DEVICE Description: STG Adhere to Safety Precautions With supervision Assistance/Device. Outcome: Progressing   Problem: RH COGNITION-NURSING Goal: RH STG ANTICIPATES NEEDS/CALLS FOR ASSIST W/ASSIST/CUES Description: STG Anticipates Needs/Calls for Assist With mod I Assistance/Cues. Outcome: Progressing   Problem: RH PAIN MANAGEMENT Goal: RH STG PAIN MANAGED AT OR BELOW PT'S PAIN GOAL Description: < 3 Outcome: Progressing   Problem: RH KNOWLEDGE DEFICIT BRAIN INJURY Goal: RH STG INCREASE KNOWLEDGE OF SELF CARE AFTER BRAIN INJURY Description: Patient/caregiver will verbalize/demonstrate understanding of brain injury recovery as directed by care team with mod I assist. Outcome: Progressing

## 2019-05-05 NOTE — Progress Notes (Signed)
Occupational Therapy Discharge Summary  Patient Details  Name: Benjamin Hahn MRN: [id number] Date of Birth: 1932/10/11  Today's Date: 05/05/2019 OT Individual Time: 1300-1410 OT Individual Time Calculation (min): 70 min    Pt greeted sitting in recliner after PA finished discharge paperwork. Discussed dc plan, OT goals, pt progress, and home BADL modifications with pt and spouse. Pt then ambulated into bathroom with close supervision and rollator. Pt sat on commode and voided bladder into urinal. OT informed nursing staff. Bathing completed at shower level with overall supervision. Pt ambulated back out of bathroom and completed dressing tasks sit<>stand from recliner with supervision.   Patient has met 11 of 11 long term goals due to improved activity tolerance, improved balance, postural control, ability to compensate for deficits, functional use of  LEFT upper extremity, improved attention, improved awareness and improved coordination.  Patient to discharge at overall Supervision level.  Patient's care partner is independent to provide the necessary physical and cognitive assistance at discharge.    Reasons goals not met: n/a  Recommendation:  Patient will benefit from ongoing skilled OT services in outpatient setting to continue to advance functional skills in the area of BADL and functional use of LUE.  Equipment: 3-in-1 BSC  Reasons for discharge: treatment goals met and discharge from hospital  Patient/family agrees with progress made and goals achieved: Yes  OT Discharge Precautions/Restrictions  Precautions Precautions: None Restrictions Weight Bearing Restrictions: No Pain Pain Assessment Pain Score: 0-No pain ADL ADL Eating: Independent Grooming: Supervision/safety(shaving) Where Assessed-Grooming: Sitting at sink Upper Body Bathing: Supervision/safety Where Assessed-Upper Body Bathing: Sitting at sink Lower Body Bathing: Supervision/safety Where  Assessed-Lower Body Bathing: Sitting at sink, Standing at sink Upper Body Dressing: Supervision/safety Where Assessed-Upper Body Dressing: Sitting at sink Lower Body Dressing: Supervision/safety Where Assessed-Lower Body Dressing: Sitting at sink, Standing at sink Toileting: Supervision/safety Toilet Transfer: Close supervision Toilet Transfer Method: Counselling psychologist: Energy manager: Not assessed Social research officer, government: Close supervision Perception  Perception: Impaired Inattention/Neglect: Does not attend to left visual field(improved since eval) Praxis Praxis: Intact Cognition Overall Cognitive Status: Impaired/Different from baseline Arousal/Alertness: Awake/alert Orientation Level: Oriented X4 Sustained Attention: Appears intact Selective Attention: Appears intact Alternating Attention: Appears intact Memory: Impaired Memory Impairment: Retrieval deficit;Decreased short term memory Awareness: Impaired Awareness Impairment: Anticipatory impairment Problem Solving: Impaired Problem Solving Impairment: Functional complex Safety/Judgment: Impaired Rancho Los Amigos Scales of Cognitive Functioning: Purposeful/appropriate Sensation Sensation Light Touch: Appears Intact Coordination Fine Motor Movements are Fluid and Coordinated: No Coordination and Movement Description: L hand coordination slighlt slower Heel Shin Test: WNL bilaterally Motor  Motor Motor: Abnormal postural alignment and control Motor - Discharge Observations: posterior lean bias Mobility  Bed Mobility Bed Mobility: Rolling Right;Rolling Left;Supine to Sit;Sit to Supine Rolling Right: Supervision/verbal cueing Rolling Left: Supervision/Verbal cueing Supine to Sit: Supervision/Verbal cueing Sit to Supine: Supervision/Verbal cueing Transfers Sit to Stand: Supervision/Verbal cueing Stand to Sit: Supervision/Verbal cueing  Trunk/Postural Assessment  Cervical  Assessment Cervical Assessment: Exceptions to WFL(forward head, unable to reach neutral extension) Thoracic Assessment Thoracic Assessment: Exceptions to WFL(kyphotic) Lumbar Assessment Lumbar Assessment: Exceptions to WFL(posterior pelvic tilt, unable to reach neutral extension) Postural Control Postural Control: Deficits on evaluation(delayed, posterior lean bias)  Balance Balance Balance Assessed: Yes Dynamic Sitting Balance Dynamic Sitting - Balance Support: During functional activity;Feet supported;No upper extremity supported Dynamic Sitting - Level of Assistance: 6: Modified independent (Device/Increase time) Dynamic Standing Balance Dynamic Standing - Balance Support: During functional activity Dynamic Standing - Level of Assistance: 5:  Stand by assistance Extremity/Trunk Assessment RUE Assessment RUE Assessment: Within Functional Limits LUE Assessment LUE Assessment: Within Functional Limits General Strength Comments: improved since eval- able to use functionally   Valma Cava 05/05/2019, 12:36 PM

## 2019-05-05 NOTE — Progress Notes (Signed)
[place] PHYSICAL MEDICINE & REHABILITATION PROGRESS NOTE   Subjective/Complaints:  Wife and patient very stressed about going home with foley. Would like it removed  ROS: Patient denies fever, rash, sore throat, blurred vision, nausea, vomiting, diarrhea, cough, shortness of breath or chest pain, joint or back pain, headache, or mood change.   Objective:   No results found. No results for input(s): WBC, HGB, HCT, PLT in the last 72 hours. Recent Labs    05/04/19 0521  NA 137  K 3.8  CL 102  CO2 26  GLUCOSE 91  BUN 26*  CREATININE 1.43*  CALCIUM 8.8*    Intake/Output Summary (Last 24 hours) at 05/05/2019 0849 Last data filed at 05/05/2019 0514 Gross per 24 hour  Intake 240 ml  Output 1100 ml  Net -860 ml     Physical Exam: Vital Signs Blood pressure 113/72, pulse 72, temperature 97.8 F (36.6 C), temperature source Oral, resp. rate 16, height 5' 7.5" (1.715 m), weight 87.1 kg, SpO2 96 %.  Constitutional: No distress . Vital signs reviewed. HEENT: EOMI, oral membranes moist Neck: supple Cardiovascular: RRR without murmur. No JVD    Respiratory: CTA Bilaterally without wheezes or rales. Normal effort    GI: BS +, non-tender, non-distended   Genitourinary:  Indwelling Foley catheter tube in placeurine clear Musculoskeletal:  General: No deformityor edema.  Neurological: alert. Normal language. Follows commands. improved insight. Mild left inattention. Moves all 4 limbs. Mild left pronator drift which is improving. . Motor 4+/5 all 4. No focal sensory findings.    Skin: scalp wound CDI Psychiatric: A little anxious   Assessment/Plan: 1. Functional deficits secondary to right SDH after fall which require 3+ hours per day of interdisciplinary therapy in a comprehensive inpatient rehab setting.  Physiatrist is providing close team supervision and 24 hour management of active medical problems listed below.  Physiatrist and rehab team continue to assess  barriers to discharge/monitor patient progress toward functional and medical goals  Care Tool:  Bathing    Body parts bathed by patient: Right arm, Left arm, Chest, Abdomen, Buttocks, Front perineal area, Right upper leg, Left upper leg, Right lower leg, Left lower leg, Face         Bathing assist Assist Level: Minimal Assistance - Patient > 75%     Upper Body Dressing/Undressing Upper body dressing   What is the patient wearing?: Pull over shirt    Upper body assist Assist Level: Contact Guard/Touching assist    Lower Body Dressing/Undressing Lower body dressing      What is the patient wearing?: Underwear/pull up, Pants     Lower body assist Assist for lower body dressing: Minimal Assistance - Patient > 75%     Toileting Toileting    Toileting assist Assist for toileting: Supervision/Verbal cueing     Transfers Chair/bed transfer  Transfers assist     Chair/bed transfer assist level: Contact Guard/Touching assist     Locomotion Ambulation   Ambulation assist      Assist level: Supervision/Verbal cueing Assistive device: Walker-rolling Max distance: 150'   Walk 10 feet activity   Assist     Assist level: Supervision/Verbal cueing Assistive device: Walker-rolling   Walk 50 feet activity   Assist    Assist level: Supervision/Verbal cueing Assistive device: Walker-rolling    Walk 150 feet activity   Assist Walk 150 feet activity did not occur: Safety/medical concerns  Assist level: Supervision/Verbal cueing Assistive device: Walker-rolling    Walk 10 feet on uneven surface  activity   Assist Walk 10 feet on uneven surfaces activity did not occur: Safety/medical concerns         Wheelchair     Assist Will patient use wheelchair at discharge?: Yes Type of Wheelchair: Manual    Wheelchair assist level: Supervision/Verbal cueing Max wheelchair distance: 23'    Wheelchair 50 feet with 2 turns  activity    Assist        Assist Level: Supervision/Verbal cueing   Wheelchair 150 feet activity     Assist  Wheelchair 150 feet activity did not occur: Safety/medical concerns   Assist Level: Supervision/Verbal cueing   Blood pressure 113/72, pulse 72, temperature 97.8 F (36.6 C), temperature source Oral, resp. rate 16, height 5' 7.5" (1.715 m), weight 87.1 kg, SpO2 96 %.  Medical Problem List and Plan: 1.Decreased functional mobility with left side weaknesssecondary to right hemispheric traumatic subacute and chronic subdural hematoma with multiple falls. Status post right frontoparietal temporal craniotomy evacuation of subdural hematoma 04/19/2019  -ELOS 9/12--finalize dc planning --Continue CIR therapies including PT, OT, and SLP   2. Antithrombotics: -DVT/anticoagulation:SCDs -antiplatelet therapy: N/A 3. Pain Management:Hydrocodone as needed 4. Mood:Tofranil 10   2000 nightly per pt request  - Wellbutrin 150 mg daily  -Effexor 37.5 mg daily  - Xanax 0.5 mg nightly as needed -antipsychotic agents: N/A 5. Neuropsych: This patientiscapable of making decisions on hisown behalf. 6. Skin/Wound Care:Routine skin checks  -staples out 7. Fluids/Electrolytes/Nutrition:reasonable intake    -push fluids   .f/u bmet with stable to imprved Cr 1.43. has baseline renal deficiency 8. Hypertension. Toprol 12.5 mg daily, Altace 1.25 mg daily.   - Vitals:   05/04/19 1943 05/05/19 0514  BP: (!) 112/58 113/72  Pulse: 74 72  Resp: 16 16  Temp: (!) 97.5 F (36.4 C) 97.8 F (36.6 C)  SpO2: 99% 96%  improved control 9/11  -continue to push fluids  -held altace   -reduced flomax to 0.4mg  9. BPH. Noted urinary retention.   -plan at time of discharge was foley in for 5-7 days then follow up with Dr Gloriann Loan in office. Given that it's been longer than that and pt/wife prefer not to go home with foley, I think it's reasonable to  remove and begin voiding trial. Pt aware of plan. Will speak with wife too.  -needs to be up to toilet to void  -continue Flomax reduced to 0.4 mg daily   10. Hypothyroidism. Synthroid 11. Constipation: + results with sorbitol  -continue daily senna-s    LOS: 14 days A FACE TO FACE EVALUATION WAS PERFORMED  Meredith Staggers 05/05/2019, 8:49 AM

## 2019-05-06 NOTE — Progress Notes (Signed)
[place] PHYSICAL MEDICINE & REHABILITATION PROGRESS NOTE   Subjective/Complaints: Voiding after foley removal   ROS: Patient denies , nausea, vomiting, diarrhea, cough, shortness of breath Objective:   No results found. No results for input(s): WBC, HGB, HCT, PLT in the last 72 hours. Recent Labs    05/04/19 0521  NA 137  K 3.8  CL 102  CO2 26  GLUCOSE 91  BUN 26*  CREATININE 1.43*  CALCIUM 8.8*    Intake/Output Summary (Last 24 hours) at 05/06/2019 1024 Last data filed at 05/06/2019 0433 Gross per 24 hour  Intake 480 ml  Output 550 ml  Net -70 ml     Physical Exam: Vital Signs Blood pressure 115/69, pulse 72, temperature 98 F (36.7 C), temperature source Oral, resp. rate 18, height 5' 7.5" (1.715 m), weight 87.1 kg, SpO2 99 %.  Constitutional: No distress . Vital signs reviewed. HEENT: EOMI, oral membranes moist Neck: supple Cardiovascular: RRR without murmur. No JVD    Respiratory: CTA Bilaterally without wheezes or rales. Normal effort    GI: BS +, non-tender, non-distended   Genitourinary:  Indwelling Foley catheter tube in placeurine clear Musculoskeletal:  General: No deformityor edema.  Neurological: alert. Normal language. Follows commands. improved insight. Mild left inattention. Moves all 4 limbs. Mild left pronator drift which is improving. . Motor 4+/5 all 4. No focal sensory findings.    Skin: scalp wound CDI Psychiatric: A little anxious   Assessment/Plan:  1. Functional deficits secondary to right SDH Stable for D/C today F/u PCP in 3-4 weeks F/u PM&R 2 weeks See D/C summary  See D/C instructions  Care Tool:  Bathing    Body parts bathed by patient: Right arm, Left arm, Chest, Abdomen, Buttocks, Front perineal area, Right upper leg, Left upper leg, Right lower leg, Left lower leg, Face         Bathing assist Assist Level: Supervision/Verbal cueing     Upper Body Dressing/Undressing Upper body dressing   What is the  patient wearing?: Pull over shirt    Upper body assist Assist Level: Supervision/Verbal cueing    Lower Body Dressing/Undressing Lower body dressing      What is the patient wearing?: Underwear/pull up, Pants     Lower body assist Assist for lower body dressing: Supervision/Verbal cueing     Toileting Toileting    Toileting assist Assist for toileting: Supervision/Verbal cueing     Transfers Chair/bed transfer  Transfers assist     Chair/bed transfer assist level: Supervision/Verbal cueing     Locomotion Ambulation   Ambulation assist      Assist level: Supervision/Verbal cueing Assistive device: Rollator Max distance: 150'   Walk 10 feet activity   Assist     Assist level: Supervision/Verbal cueing Assistive device: Rollator   Walk 50 feet activity   Assist    Assist level: Supervision/Verbal cueing Assistive device: Rollator    Walk 150 feet activity   Assist Walk 150 feet activity did not occur: Safety/medical concerns  Assist level: Supervision/Verbal cueing Assistive device: Rollator    Walk 10 feet on uneven surface  activity   Assist Walk 10 feet on uneven surfaces activity did not occur: Safety/medical concerns   Assist level: Contact Guard/Touching assist Assistive device: Rollator   Wheelchair     Assist Will patient use wheelchair at discharge?: No Type of Wheelchair: Manual Wheelchair activity did not occur: N/A  Wheelchair assist level: Supervision/Verbal cueing Max wheelchair distance: 70'    Wheelchair 50 feet with  2 turns activity    Assist    Wheelchair 50 feet with 2 turns activity did not occur: N/A   Assist Level: Supervision/Verbal cueing   Wheelchair 150 feet activity     Assist  Wheelchair 150 feet activity did not occur: N/A   Assist Level: Supervision/Verbal cueing   Blood pressure 115/69, pulse 72, temperature 98 F (36.7 C), temperature source Oral, resp. rate 18, height 5'  7.5" (1.715 m), weight 87.1 kg, SpO2 99 %.  Medical Problem List and Plan: 1.Decreased functional mobility with left side weaknesssecondary to right hemispheric traumatic subacute and chronic subdural hematoma with multiple falls. Status post right frontoparietal temporal craniotomy evacuation of subdural hematoma 04/19/2019  D/c home today   2. Antithrombotics: -DVT/anticoagulation:SCDs -antiplatelet therapy: N/A 3. Pain Management:Hydrocodone as needed 4. Mood:Tofranil 10   2000 nightly per pt request  - Wellbutrin 150 mg daily  -Effexor 37.5 mg daily  - Xanax 0.5 mg nightly as needed -antipsychotic agents: N/A 5. Neuropsych: This patientiscapable of making decisions on hisown behalf. 6. Skin/Wound Care:Routine skin checks  -staples out 7. Fluids/Electrolytes/Nutrition:reasonable intake    -push fluids   .f/u bmet with stable to imprved Cr 1.43. has baseline renal deficiency 8. Hypertension. Toprol 12.5 mg daily, Altace 1.25 mg daily.   - Vitals:   05/05/19 2005 05/06/19 0351  BP: (!) 110/58 115/69  Pulse: 78 72  Resp: 18 18  Temp: 98.3 F (36.8 C) 98 F (36.7 C)  SpO2: 95% 99%  improved control 9/11  -continue to push fluids  -held altace   -reduced flomax to 0.4mg  9. BPH. retention resolved f/u with Uro 10/1 discussed with wife  -needs to be up to toilet to void  -continue Flomax reduced to 0.4 mg daily   10. Hypothyroidism. Synthroid 11. Constipation: + results with sorbitol  -continue daily senna-s    LOS: 15 days A FACE TO FACE EVALUATION WAS PERFORMED  Charlett Blake 05/06/2019, 10:24 AM

## 2019-05-06 NOTE — Progress Notes (Signed)
Discharge to home accompanied by wife. Discharge info given by Linna Hoff yesterday, questions reviewed and Md checked pt prior to discharge. Taken down to car in wheelchair with belongings and [facility]. Margarito Liner

## 2019-05-08 ENCOUNTER — Ambulatory Visit: Payer: Medicare Other | Admitting: Occupational Therapy

## 2019-05-08 ENCOUNTER — Ambulatory Visit: Payer: Medicare Other | Admitting: Physical Therapy

## 2019-05-08 ENCOUNTER — Telehealth (HOSPITAL_COMMUNITY): Payer: Self-pay

## 2019-05-08 NOTE — Progress Notes (Signed)
Spoke with pt's wife, Lou Miner, this afternoon who is requesting a change to [facility] instead of OP therapies. States they are having difficulty with mobility in the home and getting to OP "is too much."  Per her request, have cancelled OP and have referred to [facility] for HHPT, OT and [place], Kaimana Lurz, LCSW

## 2019-05-09 ENCOUNTER — Other Ambulatory Visit: Payer: Self-pay | Admitting: Neurosurgery

## 2019-05-09 DIAGNOSIS — S065X9A Traumatic subdural hemorrhage with loss of consciousness of unspecified duration, initial encounter: Secondary | ICD-10-CM

## 2019-05-09 DIAGNOSIS — S065XAA Traumatic subdural hemorrhage with loss of consciousness status unknown, initial encounter: Secondary | ICD-10-CM

## 2019-05-10 ENCOUNTER — Telehealth: Payer: Self-pay | Admitting: Internal Medicine

## 2019-05-10 ENCOUNTER — Ambulatory Visit: Payer: Self-pay | Admitting: *Deleted

## 2019-05-10 DIAGNOSIS — R338 Other retention of urine: Secondary | ICD-10-CM | POA: Diagnosis not present

## 2019-05-10 DIAGNOSIS — F329 Major depressive disorder, single episode, unspecified: Secondary | ICD-10-CM | POA: Diagnosis not present

## 2019-05-10 DIAGNOSIS — Z9181 History of falling: Secondary | ICD-10-CM | POA: Diagnosis not present

## 2019-05-10 DIAGNOSIS — F419 Anxiety disorder, unspecified: Secondary | ICD-10-CM | POA: Diagnosis not present

## 2019-05-10 DIAGNOSIS — M1991 Primary osteoarthritis, unspecified site: Secondary | ICD-10-CM | POA: Diagnosis not present

## 2019-05-10 DIAGNOSIS — E039 Hypothyroidism, unspecified: Secondary | ICD-10-CM | POA: Diagnosis not present

## 2019-05-10 DIAGNOSIS — N390 Urinary tract infection, site not specified: Secondary | ICD-10-CM | POA: Diagnosis not present

## 2019-05-10 DIAGNOSIS — I10 Essential (primary) hypertension: Secondary | ICD-10-CM | POA: Diagnosis not present

## 2019-05-10 DIAGNOSIS — K219 Gastro-esophageal reflux disease without esophagitis: Secondary | ICD-10-CM | POA: Diagnosis not present

## 2019-05-10 DIAGNOSIS — S065X0D Traumatic subdural hemorrhage without loss of consciousness, subsequent encounter: Secondary | ICD-10-CM | POA: Diagnosis not present

## 2019-05-10 DIAGNOSIS — N401 Enlarged prostate with lower urinary tract symptoms: Secondary | ICD-10-CM | POA: Diagnosis not present

## 2019-05-10 DIAGNOSIS — E785 Hyperlipidemia, unspecified: Secondary | ICD-10-CM | POA: Diagnosis not present

## 2019-05-10 NOTE — Telephone Encounter (Signed)
Patient's wife stating Benjamin Hahn has not had a BM since discharged from the hospital on Saturday.Does not have the urge to try.Passing gas.  Denies abdominal pain/cramping/N/V/bloated abdomen.No fever No rectal pain. Was cleaned out prior to discharge. Care Advice reviewed including start a softner daily/drink plenty of water/eat prunes/prune juice daily. Increase fiber intake with fruits and vegetables.Mild laxative okay to use. Schedule potty time several times daily to have a routine for the bowels. Urgent symptoms reviewed to call back if arises. Let us know if these measures do not work for patient. Stated she understood plan.   Reason for Disposition . Treating constipation with Over-The-Counter (OTC) medicines, questions about  Answer Assessment - Initial Assessment Questions 1. STOOL PATTERN OR FREQUENCY: "How often do you pass bowel movements (BMs)?"  (Normal range: tid to q 3 days)  "When was the last BM passed?"       2. STRAINING: "Do you have to strain to have a BM?"      Has not attempted to have a BM as of yet. 3. RECTAL PAIN: "Does your rectum hurt when the stool comes out?" If so, ask: "Do you have hemorrhoids? How bad is the pain?"  (Scale 1-10; or mild, moderate, severe)    na 4. STOOL COMPOSITION: "Are the stools hard?"     no 5. BLOOD ON STOOLS: "Has there been any blood on the toilet tissue or on the surface of the BM?" If so, ask: "When was the last time?"      No 6. CHRONIC CONSTIPATION: "Is this a new problem for you?"  If no, ask: "How long have you had this problem?" (days, weeks, months)      yes 7. CHANGES IN DIET: "Have there been any recent changes in your diet?"      Yes, Discharged from hospital 4 days ago. 8. MEDICATIONS: "Have you been taking any new medications?"     Yes, Bactrim 9. LAXATIVES: "Have you been using any laxatives or enemas?"  If yes, ask "What, how often, and when was the last time?"    None as of yet 10. CAUSE: "What do you think is  causing the constipation?"        Change in diet and activity since leaving the hospital 11. OTHER SYMPTOMS: "Do you have any other symptoms?" (e.g., abdominal pain, fever, vomiting)       Denies all. 12. PREGNANCY: "Is there any chance you are pregnant?" "When was your last menstrual period?"       na  Protocols used: CONSTIPATION-A-AH

## 2019-05-10 NOTE — Telephone Encounter (Signed)
Yes on the orders  TJ

## 2019-05-10 NOTE — Telephone Encounter (Signed)
[facility] calling Rosalie Doctor) and is requesting a medical social worker assessment for 1 month 1 time. In addition requesting Phy Therapy 2 times a wk for 3 wk and 1 time a wk for 2 wk  Also pt just had brain surgery  but is still have headaches on exertion . Does he need a referral for neurologist?  Call back # is [phone]

## 2019-05-10 NOTE — Telephone Encounter (Signed)
Please review orders below and advise. You last saw pt 07/2018.   Pt has been seen by other providers in the office.

## 2019-05-11 DIAGNOSIS — R3914 Feeling of incomplete bladder emptying: Secondary | ICD-10-CM | POA: Diagnosis not present

## 2019-05-11 DIAGNOSIS — R300 Dysuria: Secondary | ICD-10-CM | POA: Diagnosis not present

## 2019-05-11 DIAGNOSIS — N401 Enlarged prostate with lower urinary tract symptoms: Secondary | ICD-10-CM | POA: Diagnosis not present

## 2019-05-11 NOTE — Telephone Encounter (Signed)
Verbal orders given to [facility] for below.

## 2019-05-12 DIAGNOSIS — I10 Essential (primary) hypertension: Secondary | ICD-10-CM | POA: Diagnosis not present

## 2019-05-12 DIAGNOSIS — E039 Hypothyroidism, unspecified: Secondary | ICD-10-CM | POA: Diagnosis not present

## 2019-05-12 DIAGNOSIS — N401 Enlarged prostate with lower urinary tract symptoms: Secondary | ICD-10-CM | POA: Diagnosis not present

## 2019-05-12 DIAGNOSIS — S065X0D Traumatic subdural hemorrhage without loss of consciousness, subsequent encounter: Secondary | ICD-10-CM | POA: Diagnosis not present

## 2019-05-12 DIAGNOSIS — N390 Urinary tract infection, site not specified: Secondary | ICD-10-CM | POA: Diagnosis not present

## 2019-05-12 DIAGNOSIS — E785 Hyperlipidemia, unspecified: Secondary | ICD-10-CM | POA: Diagnosis not present

## 2019-05-15 ENCOUNTER — Telehealth: Payer: Self-pay

## 2019-05-15 DIAGNOSIS — E785 Hyperlipidemia, unspecified: Secondary | ICD-10-CM | POA: Diagnosis not present

## 2019-05-15 DIAGNOSIS — N390 Urinary tract infection, site not specified: Secondary | ICD-10-CM | POA: Diagnosis not present

## 2019-05-15 DIAGNOSIS — E039 Hypothyroidism, unspecified: Secondary | ICD-10-CM | POA: Diagnosis not present

## 2019-05-15 DIAGNOSIS — S065X0D Traumatic subdural hemorrhage without loss of consciousness, subsequent encounter: Secondary | ICD-10-CM | POA: Diagnosis not present

## 2019-05-15 DIAGNOSIS — N401 Enlarged prostate with lower urinary tract symptoms: Secondary | ICD-10-CM | POA: Diagnosis not present

## 2019-05-15 DIAGNOSIS — I10 Essential (primary) hypertension: Secondary | ICD-10-CM | POA: Diagnosis not present

## 2019-05-15 NOTE — Telephone Encounter (Signed)
Benjamin Hahn OT [facility] called requesting verbal orders for 2wk2 followed by 1wk1.  Called him back and approved verbal orders

## 2019-05-16 DIAGNOSIS — E785 Hyperlipidemia, unspecified: Secondary | ICD-10-CM | POA: Diagnosis not present

## 2019-05-16 DIAGNOSIS — N401 Enlarged prostate with lower urinary tract symptoms: Secondary | ICD-10-CM | POA: Diagnosis not present

## 2019-05-16 DIAGNOSIS — N390 Urinary tract infection, site not specified: Secondary | ICD-10-CM | POA: Diagnosis not present

## 2019-05-16 DIAGNOSIS — S065X0D Traumatic subdural hemorrhage without loss of consciousness, subsequent encounter: Secondary | ICD-10-CM | POA: Diagnosis not present

## 2019-05-16 DIAGNOSIS — I10 Essential (primary) hypertension: Secondary | ICD-10-CM | POA: Diagnosis not present

## 2019-05-16 DIAGNOSIS — E039 Hypothyroidism, unspecified: Secondary | ICD-10-CM | POA: Diagnosis not present

## 2019-05-17 DIAGNOSIS — E039 Hypothyroidism, unspecified: Secondary | ICD-10-CM

## 2019-05-17 DIAGNOSIS — M1991 Primary osteoarthritis, unspecified site: Secondary | ICD-10-CM

## 2019-05-17 DIAGNOSIS — N401 Enlarged prostate with lower urinary tract symptoms: Secondary | ICD-10-CM

## 2019-05-17 DIAGNOSIS — K219 Gastro-esophageal reflux disease without esophagitis: Secondary | ICD-10-CM

## 2019-05-17 DIAGNOSIS — N390 Urinary tract infection, site not specified: Secondary | ICD-10-CM | POA: Diagnosis not present

## 2019-05-17 DIAGNOSIS — I10 Essential (primary) hypertension: Secondary | ICD-10-CM | POA: Diagnosis not present

## 2019-05-17 DIAGNOSIS — E785 Hyperlipidemia, unspecified: Secondary | ICD-10-CM | POA: Diagnosis not present

## 2019-05-17 DIAGNOSIS — F419 Anxiety disorder, unspecified: Secondary | ICD-10-CM

## 2019-05-17 DIAGNOSIS — Z9181 History of falling: Secondary | ICD-10-CM | POA: Diagnosis not present

## 2019-05-17 DIAGNOSIS — S065X0D Traumatic subdural hemorrhage without loss of consciousness, subsequent encounter: Secondary | ICD-10-CM | POA: Diagnosis not present

## 2019-05-17 DIAGNOSIS — F329 Major depressive disorder, single episode, unspecified: Secondary | ICD-10-CM | POA: Diagnosis not present

## 2019-05-17 DIAGNOSIS — R338 Other retention of urine: Secondary | ICD-10-CM

## 2019-05-18 DIAGNOSIS — E039 Hypothyroidism, unspecified: Secondary | ICD-10-CM | POA: Diagnosis not present

## 2019-05-18 DIAGNOSIS — N390 Urinary tract infection, site not specified: Secondary | ICD-10-CM | POA: Diagnosis not present

## 2019-05-18 DIAGNOSIS — I10 Essential (primary) hypertension: Secondary | ICD-10-CM | POA: Diagnosis not present

## 2019-05-18 DIAGNOSIS — N401 Enlarged prostate with lower urinary tract symptoms: Secondary | ICD-10-CM | POA: Diagnosis not present

## 2019-05-18 DIAGNOSIS — S065X0D Traumatic subdural hemorrhage without loss of consciousness, subsequent encounter: Secondary | ICD-10-CM | POA: Diagnosis not present

## 2019-05-18 DIAGNOSIS — E785 Hyperlipidemia, unspecified: Secondary | ICD-10-CM | POA: Diagnosis not present

## 2019-05-19 ENCOUNTER — Telehealth: Payer: Self-pay

## 2019-05-19 DIAGNOSIS — N390 Urinary tract infection, site not specified: Secondary | ICD-10-CM | POA: Diagnosis not present

## 2019-05-19 DIAGNOSIS — N401 Enlarged prostate with lower urinary tract symptoms: Secondary | ICD-10-CM | POA: Diagnosis not present

## 2019-05-19 DIAGNOSIS — E785 Hyperlipidemia, unspecified: Secondary | ICD-10-CM | POA: Diagnosis not present

## 2019-05-19 DIAGNOSIS — S065X0D Traumatic subdural hemorrhage without loss of consciousness, subsequent encounter: Secondary | ICD-10-CM | POA: Diagnosis not present

## 2019-05-19 DIAGNOSIS — I10 Essential (primary) hypertension: Secondary | ICD-10-CM | POA: Diagnosis not present

## 2019-05-19 DIAGNOSIS — E039 Hypothyroidism, unspecified: Secondary | ICD-10-CM | POA: Diagnosis not present

## 2019-05-19 NOTE — Telephone Encounter (Signed)
Aldin ST [facility] called requesting verbal orders for 1wk4.  Called her back and approved verbal orders.

## 2019-05-22 ENCOUNTER — Telehealth: Payer: Self-pay | Admitting: Internal Medicine

## 2019-05-22 DIAGNOSIS — E785 Hyperlipidemia, unspecified: Secondary | ICD-10-CM | POA: Diagnosis not present

## 2019-05-22 DIAGNOSIS — S065X0D Traumatic subdural hemorrhage without loss of consciousness, subsequent encounter: Secondary | ICD-10-CM | POA: Diagnosis not present

## 2019-05-22 DIAGNOSIS — E039 Hypothyroidism, unspecified: Secondary | ICD-10-CM | POA: Diagnosis not present

## 2019-05-22 DIAGNOSIS — N390 Urinary tract infection, site not specified: Secondary | ICD-10-CM | POA: Diagnosis not present

## 2019-05-22 DIAGNOSIS — N401 Enlarged prostate with lower urinary tract symptoms: Secondary | ICD-10-CM | POA: Diagnosis not present

## 2019-05-22 DIAGNOSIS — I10 Essential (primary) hypertension: Secondary | ICD-10-CM | POA: Diagnosis not present

## 2019-05-22 NOTE — Telephone Encounter (Signed)
Spoke to pt spouse and she stated that dizziness resolved over the weekend and he is feeling well.

## 2019-05-22 NOTE — Telephone Encounter (Signed)
I spoke to pt spouse this morning. She did not mention any symptoms.

## 2019-05-22 NOTE — Telephone Encounter (Signed)
Patient spoke with [facility] on 05/20/19 at 2:52pm.  States that patient had hematoma surgery  2 weeks ago. States that patient was in rehab for 2 weeks.  States this afternoon patient was experiencing dizziness.  Was requesting Rx. Patient was advised to go to the ED.  I do not see ED visit.

## 2019-05-23 DIAGNOSIS — S065X0D Traumatic subdural hemorrhage without loss of consciousness, subsequent encounter: Secondary | ICD-10-CM | POA: Diagnosis not present

## 2019-05-23 DIAGNOSIS — E785 Hyperlipidemia, unspecified: Secondary | ICD-10-CM | POA: Diagnosis not present

## 2019-05-23 DIAGNOSIS — N390 Urinary tract infection, site not specified: Secondary | ICD-10-CM | POA: Diagnosis not present

## 2019-05-23 DIAGNOSIS — N401 Enlarged prostate with lower urinary tract symptoms: Secondary | ICD-10-CM | POA: Diagnosis not present

## 2019-05-23 DIAGNOSIS — I10 Essential (primary) hypertension: Secondary | ICD-10-CM | POA: Diagnosis not present

## 2019-05-23 DIAGNOSIS — E039 Hypothyroidism, unspecified: Secondary | ICD-10-CM | POA: Diagnosis not present

## 2019-05-24 ENCOUNTER — Inpatient Hospital Stay: Payer: Medicare Other | Admitting: Internal Medicine

## 2019-05-24 ENCOUNTER — Encounter: Payer: Self-pay | Admitting: Physical Medicine & Rehabilitation

## 2019-05-24 ENCOUNTER — Other Ambulatory Visit: Payer: Self-pay

## 2019-05-24 ENCOUNTER — Encounter: Payer: Medicare Other | Attending: Physical Medicine & Rehabilitation | Admitting: Physical Medicine & Rehabilitation

## 2019-05-24 VITALS — BP 129/72 | HR 64 | Temp 97.9°F | Ht 67.5 in | Wt 181.0 lb

## 2019-05-24 DIAGNOSIS — S065X1S Traumatic subdural hemorrhage with loss of consciousness of 30 minutes or less, sequela: Secondary | ICD-10-CM | POA: Diagnosis not present

## 2019-05-24 NOTE — Progress Notes (Signed)
Subjective:    Patient ID: Benjamin Hahn, male    DOB: 1933/05/16, 85 y.o.   MRN: [id number]  HPI   Mr. Benjamin Hahn is here in follow up of his SDH and rehab stay. He left [place] on 9/12. Therapy has been working on strength and balance. He uses his rollator for balance. He had balance issues at baseline.  He is aware that safety is paramount  He has some concentration and word retrieval issues at times during conversations.  However he is noticing some improvement there as well  His pain is minimal.   Vision has been normal.  Appetite has been good.  Swallowing is improving.  He is having no issues with solids or liquids.  He had a UTI after leaving the hospital after the foley was removed.  He was placed on antibiotics and urinary patterns seem to be returning to normal.     Pain Inventory Average Pain 0 Pain Right Now 0 My pain is na  In the last 24 hours, has pain interfered with the following? General activity 0 Relation with others 0 Enjoyment of life 0 What TIME of day is your pain at its worst? na Sleep (in general) Good  Pain is worse with: na Pain improves with: na Relief from Meds: na  Mobility use a walker ability to climb steps?  no do you drive?  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  no  Physicians involved in your care Primary care Dr. Scarlette Calico Neurosurgeon Dr. Rita Ohara   Family History  Problem Relation Age of Onset  . Stroke Father        Died, 80s  . Stroke Sister        Living, 76  . Heart disease Mother        Died, 32  . Healthy Daughter   . Diabetes Maternal Grandmother   . Hypertension Neg Hx    Social History   Socioeconomic History  . Marital status: Married    Spouse name: Not on file  . Number of children: Not on file  . Years of education: Not on file  . Highest education level: Not on file   Occupational History  . Not on file  Social Needs  . Financial resource strain: Not on file  . Food insecurity    Worry: Not on file    Inability: Not on file  . Transportation needs    Medical: Not on file    Non-medical: Not on file  Tobacco Use  . Smoking status: Former Smoker    Types: Cigarettes    Quit date: 08/24/1966    Years since quitting: 52.7  . Smokeless tobacco: Never Used  Substance and Sexual Activity  . Alcohol use: Yes    Alcohol/week: 2.0 standard drinks    Types: 2 Shots of liquor per week    Comment: socially  . Drug use: No  . Sexual activity: Not on file  Lifestyle  . Physical activity    Days per week: Not on file    Minutes per session: Not on file  . Stress: Not on file  Relationships  . Social Herbalist on phone: Not on file    Gets together: Not on file    Attends religious service: Not on file    Active member of club or organization: Not on file  Attends meetings of clubs or organizations: Not on file    Relationship status: Not on file  Other Topics Concern  . Not on file  Social History Narrative   Currently works as a Midwife.  Lives with wife and they have two healthy daughters.   Past Surgical History:  Procedure Laterality Date  . ARTERY BIOPSY Right 06/20/2013   Procedure: BIOPSY TEMPORAL ARTERY RIGHT;  Surgeon: Earnstine Regal, MD;  Location: [facility];  Service: General;  Laterality: Right;  . CARDIAC CATHETERIZATION N/A 09/02/2015   Procedure: Left Heart Cath and Coronary Angiography;  Surgeon: Charolette Forward, MD;  Location: [place] [facility];  Service: Cardiovascular;  Laterality: N/A;  . CARDIAC CATHETERIZATION N/A 09/02/2015   Procedure: Coronary Stent Intervention;  Surgeon: Charolette Forward, MD;  Location: [place] [facility];  Service: Cardiovascular;  Laterality: N/A;  1.  mid RCA      (3.0/28mm Xience) 2.  Mid LAD      (3.0/23mm Xience)  . CRANIOTOMY N/A 04/19/2019   Procedure: CRANIOTOMY HEMATOMA EVACUATION  SUBDURAL;  Surgeon: Jovita Gamma, MD;  Location: [place];  Service: Neurosurgery;  Laterality: N/A;  CRANIOTOMY HEMATOMA EVACUATION SUBDURAL  . None     Past Medical History:  Diagnosis Date  . Anxiety   . Arthritis    might be in back, no problems  . BPH (benign prostatic hyperplasia)   . Depression 1990s  . GERD (gastroesophageal reflux disease)    occasional  . Hypercholesteremia    controled  . Hypothyroid   . Shingles May 2014   "Right face, still has some"  . Temporal arteritis ([place])   . Transient blindness of both eyes    BP 129/72   Pulse 64   Temp 97.9 F (36.6 C)   Ht 5' 7.5" (1.715 m)   Wt 181 lb (82.1 kg)   SpO2 95%   BMI 27.93 kg/m   Opioid Risk Score:   Fall Risk Score:  `1  Depression screen PHQ 2/9  Depression screen [facility] 2/9 05/24/2019 07/28/2018 01/06/2018 11/04/2016 07/21/2015 11/28/2014 02/14/2014  Decreased Interest 0 2 1 1  0 0 -  Down, Depressed, Hopeless 0 1 3 2  0 2 3  PHQ - 2 Score 0 3 4 3  0 2 3  Altered sleeping 0 2 1 - - 1 1  Tired, decreased energy 1 3 3  - - 0 1  Change in appetite 0 2 3 - - 0 0  Feeling bad or failure about yourself  1 1 1  - - 0 1  Trouble concentrating 0 2 0 - - 1 2  Moving slowly or fidgety/restless 0 0 0 - - 0 0  Suicidal thoughts 0 0 1 - - 0 2  PHQ-9 Score 2 13 13  - - 4 10  Difficult doing work/chores Not difficult at all Somewhat difficult Somewhat difficult - - Somewhat difficult -  Some recent data might be hidden    Review of Systems  Genitourinary:       Urine retention  All other systems reviewed and are negative.      Objective:   Physical Exam Gen: no distress, normal appearing HEENT: oral mucosa pink and moist, NCAT Cardio: Reg rate Chest: normal effort, normal rate of breathing Abd: soft, non-distended Ext: no edema Skin: intact Neuro: Musculoskeletal: Psych: pleasant, normal affect        Assessment & Plan:  1.Decreased functional mobility with left side weaknesssecondary to right  hemispheric traumatic subacute and chronic subdural  hematoma with multiple falls. Status post right frontoparietal temporal craniotomy evacuation of subdural hematoma 04/19/2019             -continue with HH therapies  -Reviewed potential neuro sequela of brain injury and risks involved with recurrent head trauma as well.  Patient and wife seem to have a good idea about safety.  He plans to be using his walker for the near future.  -Could consider transition to outpatient therapies.  However patient is just getting started with home health therapies and we will go down this path first  2. Mood:In good spirits 3Hypertension.               -per primary 4. BPH. Noted urinary retention. resolved             -pt with UTI's after foley  5. Hypothyroidism. Synthroid  Patient is nearly independent with mobility and is independent with self-care.  Cognitively is near baseline.  He does not need to see me back for a scheduled visit.  However am happy to see him as needed.  I told his wife and the patient to call me if they want referral to outpatient therapy once home health winds down.

## 2019-05-24 NOTE — Patient Instructions (Signed)
PLEASE FEEL FREE TO CALL OUR OFFICE WITH ANY PROBLEMS OR QUESTIONS ([phone])      

## 2019-05-25 DIAGNOSIS — R3914 Feeling of incomplete bladder emptying: Secondary | ICD-10-CM | POA: Diagnosis not present

## 2019-05-25 DIAGNOSIS — E785 Hyperlipidemia, unspecified: Secondary | ICD-10-CM | POA: Diagnosis not present

## 2019-05-25 DIAGNOSIS — N390 Urinary tract infection, site not specified: Secondary | ICD-10-CM | POA: Diagnosis not present

## 2019-05-25 DIAGNOSIS — I10 Essential (primary) hypertension: Secondary | ICD-10-CM | POA: Diagnosis not present

## 2019-05-25 DIAGNOSIS — E039 Hypothyroidism, unspecified: Secondary | ICD-10-CM | POA: Diagnosis not present

## 2019-05-25 DIAGNOSIS — N3000 Acute cystitis without hematuria: Secondary | ICD-10-CM | POA: Diagnosis not present

## 2019-05-25 DIAGNOSIS — S065X0D Traumatic subdural hemorrhage without loss of consciousness, subsequent encounter: Secondary | ICD-10-CM | POA: Diagnosis not present

## 2019-05-25 DIAGNOSIS — N401 Enlarged prostate with lower urinary tract symptoms: Secondary | ICD-10-CM | POA: Diagnosis not present

## 2019-05-26 DIAGNOSIS — E039 Hypothyroidism, unspecified: Secondary | ICD-10-CM | POA: Diagnosis not present

## 2019-05-26 DIAGNOSIS — N401 Enlarged prostate with lower urinary tract symptoms: Secondary | ICD-10-CM | POA: Diagnosis not present

## 2019-05-26 DIAGNOSIS — N390 Urinary tract infection, site not specified: Secondary | ICD-10-CM | POA: Diagnosis not present

## 2019-05-26 DIAGNOSIS — S065X0D Traumatic subdural hemorrhage without loss of consciousness, subsequent encounter: Secondary | ICD-10-CM | POA: Diagnosis not present

## 2019-05-26 DIAGNOSIS — E785 Hyperlipidemia, unspecified: Secondary | ICD-10-CM | POA: Diagnosis not present

## 2019-05-26 DIAGNOSIS — I10 Essential (primary) hypertension: Secondary | ICD-10-CM | POA: Diagnosis not present

## 2019-05-29 DIAGNOSIS — N390 Urinary tract infection, site not specified: Secondary | ICD-10-CM | POA: Diagnosis not present

## 2019-05-29 DIAGNOSIS — I10 Essential (primary) hypertension: Secondary | ICD-10-CM | POA: Diagnosis not present

## 2019-05-29 DIAGNOSIS — S065X0D Traumatic subdural hemorrhage without loss of consciousness, subsequent encounter: Secondary | ICD-10-CM | POA: Diagnosis not present

## 2019-05-29 DIAGNOSIS — N401 Enlarged prostate with lower urinary tract symptoms: Secondary | ICD-10-CM | POA: Diagnosis not present

## 2019-05-29 DIAGNOSIS — E785 Hyperlipidemia, unspecified: Secondary | ICD-10-CM | POA: Diagnosis not present

## 2019-05-29 DIAGNOSIS — E039 Hypothyroidism, unspecified: Secondary | ICD-10-CM | POA: Diagnosis not present

## 2019-05-30 ENCOUNTER — Other Ambulatory Visit: Payer: Self-pay | Admitting: Neurosurgery

## 2019-05-30 DIAGNOSIS — N401 Enlarged prostate with lower urinary tract symptoms: Secondary | ICD-10-CM | POA: Diagnosis not present

## 2019-05-30 DIAGNOSIS — S065X9A Traumatic subdural hemorrhage with loss of consciousness of unspecified duration, initial encounter: Secondary | ICD-10-CM

## 2019-05-30 DIAGNOSIS — S065X0D Traumatic subdural hemorrhage without loss of consciousness, subsequent encounter: Secondary | ICD-10-CM | POA: Diagnosis not present

## 2019-05-30 DIAGNOSIS — E039 Hypothyroidism, unspecified: Secondary | ICD-10-CM | POA: Diagnosis not present

## 2019-05-30 DIAGNOSIS — S065XAA Traumatic subdural hemorrhage with loss of consciousness status unknown, initial encounter: Secondary | ICD-10-CM

## 2019-05-30 DIAGNOSIS — N390 Urinary tract infection, site not specified: Secondary | ICD-10-CM | POA: Diagnosis not present

## 2019-05-30 DIAGNOSIS — E785 Hyperlipidemia, unspecified: Secondary | ICD-10-CM | POA: Diagnosis not present

## 2019-05-30 DIAGNOSIS — I10 Essential (primary) hypertension: Secondary | ICD-10-CM | POA: Diagnosis not present

## 2019-05-31 ENCOUNTER — Ambulatory Visit
Admission: RE | Admit: 2019-05-31 | Discharge: 2019-05-31 | Disposition: A | Discharge status: Home / Self Care | Payer: Medicare Other | Source: Ambulatory Visit | Attending: Neurosurgery | Admitting: Neurosurgery

## 2019-05-31 ENCOUNTER — Telehealth: Payer: Self-pay | Admitting: Internal Medicine

## 2019-05-31 DIAGNOSIS — I6200 Nontraumatic subdural hemorrhage, unspecified: Secondary | ICD-10-CM | POA: Diagnosis not present

## 2019-05-31 DIAGNOSIS — S065XAA Traumatic subdural hemorrhage with loss of consciousness status unknown, initial encounter: Secondary | ICD-10-CM

## 2019-05-31 DIAGNOSIS — S065X9A Traumatic subdural hemorrhage with loss of consciousness of unspecified duration, initial encounter: Secondary | ICD-10-CM

## 2019-05-31 NOTE — Telephone Encounter (Signed)
Eric with [facility] is calling in for VO for pt.   PT extention - Frequency: 2 week 4   CB: [phone]

## 2019-06-01 ENCOUNTER — Other Ambulatory Visit (INDEPENDENT_AMBULATORY_CARE_PROVIDER_SITE_OTHER): Payer: Medicare Other

## 2019-06-01 ENCOUNTER — Other Ambulatory Visit: Payer: Self-pay

## 2019-06-01 ENCOUNTER — Ambulatory Visit (INDEPENDENT_AMBULATORY_CARE_PROVIDER_SITE_OTHER): Payer: Medicare Other | Admitting: Internal Medicine

## 2019-06-01 ENCOUNTER — Encounter: Payer: Self-pay | Admitting: Internal Medicine

## 2019-06-01 VITALS — BP 110/54 | HR 73 | Temp 98.5°F | Resp 16 | Ht 67.5 in | Wt 180.5 lb

## 2019-06-01 DIAGNOSIS — S065X1S Traumatic subdural hemorrhage with loss of consciousness of 30 minutes or less, sequela: Secondary | ICD-10-CM

## 2019-06-01 DIAGNOSIS — D539 Nutritional anemia, unspecified: Secondary | ICD-10-CM

## 2019-06-01 DIAGNOSIS — E785 Hyperlipidemia, unspecified: Secondary | ICD-10-CM | POA: Diagnosis not present

## 2019-06-01 DIAGNOSIS — K5904 Chronic idiopathic constipation: Secondary | ICD-10-CM

## 2019-06-01 DIAGNOSIS — E039 Hypothyroidism, unspecified: Secondary | ICD-10-CM

## 2019-06-01 DIAGNOSIS — E559 Vitamin D deficiency, unspecified: Secondary | ICD-10-CM | POA: Insufficient documentation

## 2019-06-01 LAB — LIPID PANEL
Cholesterol: 126 mg/dL (ref 0–200)
HDL: 56.5 mg/dL (ref >39.00)
LDL Cholesterol: 51 mg/dL (ref 0–99)
NonHDL: 69.36
Total CHOL/HDL Ratio: 2
Triglycerides: 91 mg/dL (ref 0.0–149.0)
VLDL: 18.2 mg/dL (ref 0.0–40.0)

## 2019-06-01 LAB — IBC PANEL
Iron: 59 ug/dL (ref 42–165)
Saturation Ratios: 24.1 % (ref 20.0–50.0)
Transferrin: 175 mg/dL — ABNORMAL LOW (ref 212.0–360.0)

## 2019-06-01 LAB — VITAMIN B12: Vitamin B-12: 324 pg/mL (ref 211–911)

## 2019-06-01 LAB — TSH: TSH: 2.4 u[IU]/mL (ref 0.35–4.50)

## 2019-06-01 LAB — VITAMIN D 25 HYDROXY (VIT D DEFICIENCY, FRACTURES): VITD: 37.56 ng/mL (ref 30.00–100.00)

## 2019-06-01 LAB — FERRITIN: Ferritin: 361.2 ng/mL — ABNORMAL HIGH (ref 22.0–322.0)

## 2019-06-01 LAB — FOLATE: Folate: 10.7 ng/mL (ref >5.9)

## 2019-06-01 MED ORDER — LINACLOTIDE 72 MCG PO CAPS: 72.0000 ug | ORAL_CAPSULE | Freq: Every day | ORAL | 0 refills | Status: DC

## 2019-06-01 NOTE — Telephone Encounter (Signed)
Spoke to [employer] and gave verbal okay for PT extention as requested.

## 2019-06-01 NOTE — Patient Instructions (Signed)
Anemia  Anemia is a condition in which you do not have enough red blood cells or hemoglobin. Hemoglobin is a substance in red blood cells that carries oxygen. When you do not have enough red blood cells or hemoglobin (are anemic), your body cannot get enough oxygen and your organs may not work properly. As a result, you may feel very tired or have other problems. What are the causes? Common causes of anemia include:  Excessive bleeding. Anemia can be caused by excessive bleeding inside or outside the body, including bleeding from the intestine or from periods in women.  Poor nutrition.  Long-lasting (chronic) kidney, thyroid, and liver disease.  Bone marrow disorders.  Cancer and treatments for cancer.  HIV (human immunodeficiency virus) and AIDS (acquired immunodeficiency syndrome).  Treatments for HIV and AIDS.  Spleen problems.  Blood disorders.  Infections, medicines, and autoimmune disorders that destroy red blood cells. What are the signs or symptoms? Symptoms of this condition include:  Minor weakness.  Dizziness.  Headache.  Feeling heartbeats that are irregular or faster than normal (palpitations).  Shortness of breath, especially with exercise.  Paleness.  Cold sensitivity.  Indigestion.  Nausea.  Difficulty sleeping.  Difficulty concentrating. Symptoms may occur suddenly or develop slowly. If your anemia is mild, you may not have symptoms. How is this diagnosed? This condition is diagnosed based on:  Blood tests.  Your medical history.  A physical exam.  Bone marrow biopsy. Your health care provider may also check your stool (feces) for blood and may do additional testing to look for the cause of your bleeding. You may also have other tests, including:  Imaging tests, such as a CT scan or MRI.  Endoscopy.  Colonoscopy. How is this treated? Treatment for this condition depends on the cause. If you continue to lose a lot of blood, you may  need to be treated at a hospital. Treatment may include:  Taking supplements of iron, vitamin S31, or folic acid.  Taking a hormone medicine (erythropoietin) that can help to stimulate red blood cell growth.  Having a blood transfusion. This may be needed if you lose a lot of blood.  Making changes to your diet.  Having surgery to remove your spleen. Follow these instructions at home:  Take over-the-counter and prescription medicines only as told by your health care provider.  Take supplements only as told by your health care provider.  Follow any diet instructions that you were given.  Keep all follow-up visits as told by your health care provider. This is important. Contact a health care provider if:  You develop new bleeding anywhere in the body. Get help right away if:  You are very weak.  You are short of breath.  You have pain in your abdomen or chest.  You are dizzy or feel faint.  You have trouble concentrating.  You have bloody or black, tarry stools.  You vomit repeatedly or you vomit up blood. Summary  Anemia is a condition in which you do not have enough red blood cells or enough of a substance in your red blood cells that carries oxygen (hemoglobin).  Symptoms may occur suddenly or develop slowly.  If your anemia is mild, you may not have symptoms.  This condition is diagnosed with blood tests as well as a medical history and physical exam. Other tests may be needed.  Treatment for this condition depends on the cause of the anemia. This information is not intended to replace advice given to you by  your health care provider. Make sure you discuss any questions you have with your health care provider. Document Released: 09/17/2004 Document Revised: 07/23/2017 Document Reviewed: 09/11/2016 Elsevier Patient Education  2020 [employer].

## 2019-06-01 NOTE — Progress Notes (Signed)
Subjective:  Patient ID: Benjamin Hahn, male    DOB: [birth year]  Age: 85 y.o. MRN: [id number]  CC: Anemia and Hypothyroidism   HPI AMIRR LIKES presents for f/up - He is 6 weeks status post traumatic subdural hematoma that required surgical intervention.  He uses a walker now but he and his wife think he is making significant improvement with respect to his strength, balance, and endurance.  He complains of constipation and intermittent bloating.  He denies abdominal pain, melena, or bloody stool.  He has not gotten much symptom relief with prunes and Colace.  At the time of discharge she was anemic.  Outpatient Medications Prior to Visit  Medication Sig Dispense Refill  . acetaminophen (TYLENOL) 325 MG tablet Take 1-2 tablets (325-650 mg total) by mouth every 4 (four) hours as needed for mild pain or headache.    [name] atorvastatin (LIPITOR) 40 MG tablet Take 1 tablet (40 mg total) by mouth daily at 6 PM. 30 tablet 3  . buPROPion (WELLBUTRIN XL) 150 MG 24 hr tablet Take 1 tablet (150 mg total) by mouth daily. 2  qam 30 tablet 2  . imipramine (TOFRANIL) 10 MG tablet 1 q hs 30 tablet 5  . levothyroxine (SYNTHROID) 88 MCG tablet Take 1 tablet (88 mcg total) by mouth daily. 30 tablet 0  . lidocaine (LIDODERM) 5 % Place 1 patch onto the skin daily. Remove & Discard patch within 12 hours or as directed by MD 30 patch 0  . metoprolol succinate (TOPROL-XL) 25 MG 24 hr tablet Take 0.5 tablets (12.5 mg total) by mouth daily. 30 tablet 0  . nitroGLYCERIN (NITROSTAT) 0.4 MG SL tablet Place 1 tablet (0.4 mg total) under the tongue every 5 (five) minutes as needed for chest pain (CP or SOB). 25 tablet 12  . tamsulosin (FLOMAX) 0.4 MG CAPS capsule Take 1 capsule (0.4 mg total) by mouth daily after supper. 30 capsule 0  . venlafaxine XR (EFFEXOR-XR) 37.5 MG 24 hr capsule Take 1 capsule (37.5 mg total) by mouth daily. 30 capsule 6  . ALPRAZolam (XANAX) 0.5 MG tablet Take 1 tablet (0.5 mg total) by  mouth at bedtime as needed for anxiety. 20 tablet 0  . clobetasol cream (TEMOVATE) 0.05 % APPLY AS DIRECTED. (Patient taking differently: Apply 1 application topically daily as needed (Leg dryness). ) 30 g 0  . HYDROcodone-acetaminophen (NORCO/VICODIN) 5-325 MG tablet Take 1-2 tablets by mouth every 4 (four) hours as needed for moderate pain. 30 tablet 0  . testosterone cypionate (DEPOTESTOSTERONE CYPIONATE) injection 200 mg     . testosterone cypionate (DEPOTESTOSTERONE CYPIONATE) injection 200 mg      No facility-administered medications prior to visit.     ROS Review of Systems  Constitutional: Negative.  Negative for chills, diaphoresis, fatigue and fever.  HENT: Negative for trouble swallowing and voice change.   Eyes: Negative.   Cardiovascular: Negative for chest pain, palpitations and leg swelling.  Gastrointestinal: Positive for constipation. Negative for abdominal pain, blood in stool, diarrhea, nausea and vomiting.  Endocrine: Negative.  Negative for cold intolerance and heat intolerance.  Genitourinary: Negative for difficulty urinating, hematuria and urgency.  Musculoskeletal: Positive for gait problem. Negative for arthralgias and myalgias.  Skin: Negative.  Negative for color change and pallor.  Neurological: Positive for weakness.  Hematological: Negative.   Psychiatric/Behavioral: Positive for decreased concentration. Negative for dysphoric mood, sleep disturbance and suicidal ideas. The patient is not nervous/anxious.     Objective:  BP (!) 110/54 (  BP Location: Left Arm, Patient Position: Sitting, Cuff Size: Normal)   Pulse 73   Temp 98.5 F (36.9 C) (Oral)   Resp 16   Ht 5' 7.5" (1.715 m)   Wt 180 lb 8 oz (81.9 kg)   SpO2 93%   BMI 27.85 kg/m   BP Readings from Last 3 Encounters:  06/01/19 (!) 110/54  05/24/19 129/72  05/06/19 115/69    Wt Readings from Last 3 Encounters:  06/01/19 180 lb 8 oz (81.9 kg)  05/24/19 181 lb (82.1 kg)  04/26/19 192 lb 1 oz  (87.1 kg)    Physical Exam Vitals signs reviewed.  Constitutional:      Appearance: Normal appearance.  HENT:     Nose: Nose normal.     Mouth/Throat:     Mouth: Mucous membranes are moist.  Eyes:     General: No scleral icterus.    Conjunctiva/sclera: Conjunctivae normal.  Neck:     Musculoskeletal: Normal range of motion and neck supple.  Cardiovascular:     Rate and Rhythm: Normal rate and regular rhythm.     Heart sounds: No murmur.  Pulmonary:     Effort: Pulmonary effort is normal.     Breath sounds: No stridor. No wheezing, rhonchi or rales.  Abdominal:     General: Abdomen is flat. Bowel sounds are normal. There is no distension.     Palpations: Abdomen is soft. There is no hepatomegaly or splenomegaly.     Tenderness: There is no abdominal tenderness.  Musculoskeletal: Normal range of motion.     Right lower leg: No edema.     Left lower leg: No edema.  Lymphadenopathy:     Cervical: No cervical adenopathy.  Skin:    General: Skin is warm and dry.  Neurological:     Mental Status: He is alert and oriented to person, place, and time. Mental status is at baseline.     Cranial Nerves: Cranial nerves are intact.     Sensory: Sensation is intact.     Motor: Weakness, atrophy and abnormal muscle tone present.     Coordination: Romberg sign positive. Coordination abnormal.     Gait: Gait abnormal.     Deep Tendon Reflexes: Reflexes abnormal.  Psychiatric:        Mood and Affect: Mood normal.        Behavior: Behavior normal.     Lab Results  Component Value Date   WBC 6.5 06/01/2019   HGB 11.0 (L) 06/01/2019   HCT 32.4 (L) 06/01/2019   PLT 236.0 06/01/2019   GLUCOSE 91 05/04/2019   CHOL 126 06/01/2019   TRIG 91.0 06/01/2019   HDL 56.50 06/01/2019   LDLCALC 51 06/01/2019   ALT 26 04/24/2019   AST 25 04/24/2019   NA 137 05/04/2019   K 3.8 05/04/2019   CL 102 05/04/2019   CREATININE 1.43 (H) 05/04/2019   BUN 26 (H) 05/04/2019   CO2 26 05/04/2019   TSH  2.40 06/01/2019   INR 1.1 04/17/2019   HGBA1C 5.5 04/14/2019    Ct Head Wo Contrast  Result Date: 06/01/2019 CLINICAL DATA:  Follow-up subdural hematoma EXAM: CT HEAD WITHOUT CONTRAST TECHNIQUE: Contiguous axial images were obtained from the base of the skull through the vertex without intravenous contrast. COMPARISON:  04/28/2019 FINDINGS: Brain: Small volume low-density beneath the bone flap which is considered epidural, 2 mm in maximal thickness. Accounting for this there is no residual subdural collection. No acute or interval hemorrhage. Brain atrophy  with ventriculomegaly. No evidence of infarct or mass. Vascular: Atherosclerotic calcification Skull: Remote right frontal craniotomy. Sinuses/Orbits: Sequela of right maxillary sinusitis with partial opacification and sinus atelectasis. IMPRESSION: Accounting for small epidural fluid collection beneath the bone flap there is no residual subdural collection. No interval abnormality. Electronically Signed   By: Monte Fantasia M.D.   On: 06/01/2019 08:35    Assessment & Plan:   Xzayvion was seen today for anemia and hypothyroidism.  Diagnoses and all orders for this visit:  Deficiency anemia- His H&H remain mildly low.  I will screen him for vitamin deficiencies. -     CBC with Differential/Platelet; Future -     Vitamin B12; Future -     IBC panel; Future -     Reticulocytes; Future -     Ferritin; Future -     Folate; Future -     Vitamin B1; Future  Traumatic subdural hematoma with loss of consciousness of 30 minutes or less, sequela ([place])- Improvement noted.  He will continue physical therapy.  He will advance his activities as tolerated.  Acquired hypothyroidism- His TSH is in the normal range.  I recommended that he stay on the current dose of levothyroxine. -     TSH; Future  Hyperlipidemia LDL goal <70- He has achieved his LDL goal and is doing well on the statin. -     Lipid panel; Future  Vitamin D deficiency -     VITAMIN  D 25 Hydroxy (Vit-D Deficiency, Fractures); Future  Chronic idiopathic constipation- His labs are negative for secondary causes.  I have asked him to take Linzess to treat this. -     linaclotide (LINZESS) 72 MCG capsule; Take 1 capsule (72 mcg total) by mouth daily before breakfast.   I have discontinued Cristopher Estimable. Baise's clobetasol cream, ALPRAZolam, and HYDROcodone-acetaminophen. I am also having him start on linaclotide. Additionally, I am having him maintain his nitroGLYCERIN, acetaminophen, buPROPion, atorvastatin, imipramine, levothyroxine, lidocaine, metoprolol succinate, tamsulosin, and venlafaxine XR. We will stop administering testosterone cypionate and testosterone cypionate.  Meds ordered this encounter  Medications  . linaclotide (LINZESS) 72 MCG capsule    Sig: Take 1 capsule (72 mcg total) by mouth daily before breakfast.    Dispense:  84 capsule    Refill:  0     Follow-up: Return in about 3 months (around 09/01/2019).  Scarlette Calico, MD

## 2019-06-02 ENCOUNTER — Telehealth: Payer: Self-pay

## 2019-06-02 DIAGNOSIS — N401 Enlarged prostate with lower urinary tract symptoms: Secondary | ICD-10-CM | POA: Diagnosis not present

## 2019-06-02 DIAGNOSIS — N390 Urinary tract infection, site not specified: Secondary | ICD-10-CM | POA: Diagnosis not present

## 2019-06-02 DIAGNOSIS — E039 Hypothyroidism, unspecified: Secondary | ICD-10-CM | POA: Diagnosis not present

## 2019-06-02 DIAGNOSIS — E785 Hyperlipidemia, unspecified: Secondary | ICD-10-CM | POA: Diagnosis not present

## 2019-06-02 DIAGNOSIS — I10 Essential (primary) hypertension: Secondary | ICD-10-CM | POA: Diagnosis not present

## 2019-06-02 DIAGNOSIS — S065X0D Traumatic subdural hemorrhage without loss of consciousness, subsequent encounter: Secondary | ICD-10-CM | POA: Diagnosis not present

## 2019-06-02 DIAGNOSIS — S065X1S Traumatic subdural hemorrhage with loss of consciousness of 30 minutes or less, sequela: Secondary | ICD-10-CM

## 2019-06-02 LAB — CBC WITH DIFFERENTIAL/PLATELET
Basophils Absolute: 0 10*3/uL (ref 0.0–0.1)
Basophils Relative: 0.5 % (ref 0.0–3.0)
Eosinophils Absolute: 0.2 10*3/uL (ref 0.0–0.7)
Eosinophils Relative: 3.2 % (ref 0.0–5.0)
HCT: 32.4 % — ABNORMAL LOW (ref 39.0–52.0)
Hemoglobin: 11 g/dL — ABNORMAL LOW (ref 13.0–17.0)
Lymphocytes Relative: 23.9 % (ref 12.0–46.0)
Lymphs Abs: 1.6 10*3/uL (ref 0.7–4.0)
MCHC: 34 g/dL (ref 30.0–36.0)
MCV: 93.3 fl (ref 78.0–100.0)
Monocytes Absolute: 0.5 10*3/uL (ref 0.1–1.0)
Monocytes Relative: 7.8 % (ref 3.0–12.0)
Neutro Abs: 4.2 10*3/uL (ref 1.4–7.7)
Neutrophils Relative %: 64.6 % (ref 43.0–77.0)
Platelets: 236 10*3/uL (ref 150.0–400.0)
RBC: 3.47 Mil/uL — ABNORMAL LOW (ref 4.22–5.81)
RDW: 13 % (ref 11.5–15.5)
WBC: 6.5 10*3/uL (ref 4.0–10.5)

## 2019-06-02 NOTE — Telephone Encounter (Signed)
Patients wife called stating that he will finish his [facility] therapies after 2 visits next week and will need referral to Outpatient Rehab. She also states they took an additional life insurance policy and need a letter stating patient needs constant monitoring, HHAide and transportation to appts. Send to [employer] Attn: [employer] [place] [address] [address] Claim # [id number] Ph# [phone] Fax# [phone]

## 2019-06-05 ENCOUNTER — Encounter: Payer: Self-pay | Admitting: Internal Medicine

## 2019-06-05 ENCOUNTER — Telehealth: Payer: Self-pay | Admitting: *Deleted

## 2019-06-05 ENCOUNTER — Encounter: Payer: Self-pay | Admitting: *Deleted

## 2019-06-05 ENCOUNTER — Other Ambulatory Visit: Payer: Self-pay | Admitting: Internal Medicine

## 2019-06-05 DIAGNOSIS — S065X0D Traumatic subdural hemorrhage without loss of consciousness, subsequent encounter: Secondary | ICD-10-CM | POA: Diagnosis not present

## 2019-06-05 DIAGNOSIS — I10 Essential (primary) hypertension: Secondary | ICD-10-CM | POA: Diagnosis not present

## 2019-06-05 DIAGNOSIS — N401 Enlarged prostate with lower urinary tract symptoms: Secondary | ICD-10-CM | POA: Diagnosis not present

## 2019-06-05 DIAGNOSIS — N390 Urinary tract infection, site not specified: Secondary | ICD-10-CM | POA: Diagnosis not present

## 2019-06-05 DIAGNOSIS — E785 Hyperlipidemia, unspecified: Secondary | ICD-10-CM | POA: Diagnosis not present

## 2019-06-05 DIAGNOSIS — E039 Hypothyroidism, unspecified: Secondary | ICD-10-CM | POA: Diagnosis not present

## 2019-06-05 LAB — RETICULOCYTES
ABS Retic: 49140 cells/uL (ref 25000–9000)
Retic Ct Pct: 1.4 %

## 2019-06-05 LAB — VITAMIN B1: Vitamin B1 (Thiamine): 15 nmol/L (ref 8–30)

## 2019-06-05 NOTE — Telephone Encounter (Signed)
Likely related to hypotension. If BP continues to be low, he needs to hold metoprolol and speak with primary MD, Dr. Ronnald Ramp.

## 2019-06-05 NOTE — Telephone Encounter (Signed)
Letter is printed and stamped with Dr. Charm Barges signature

## 2019-06-05 NOTE — Telephone Encounter (Signed)
Letter is written and order entered into epic

## 2019-06-05 NOTE — Telephone Encounter (Signed)
Benjamin Hahn, OT, [email or web address]  reports that patient had an episode of headache and almost had a syncopic episode during home therapy.  He was placed in a sitting position and eventually lie down in bed.  He was advised to seek medical attention if headache continues.  His blood pressure was recorded at 108/58.

## 2019-06-05 NOTE — Telephone Encounter (Signed)
Letter faxed to [facility] with information provided. Patient notified

## 2019-06-07 ENCOUNTER — Telehealth: Payer: Self-pay | Admitting: *Deleted

## 2019-06-07 DIAGNOSIS — N3000 Acute cystitis without hematuria: Secondary | ICD-10-CM | POA: Diagnosis not present

## 2019-06-07 NOTE — Telephone Encounter (Signed)
Letter signed by Dr. Naaman Plummer submitted via fax to [facility] stating the need for Hands on assistance with Home Aide and transportation requirement.

## 2019-06-08 DIAGNOSIS — R3914 Feeling of incomplete bladder emptying: Secondary | ICD-10-CM | POA: Diagnosis not present

## 2019-06-08 DIAGNOSIS — N3000 Acute cystitis without hematuria: Secondary | ICD-10-CM | POA: Diagnosis not present

## 2019-06-09 ENCOUNTER — Ambulatory Visit (INDEPENDENT_AMBULATORY_CARE_PROVIDER_SITE_OTHER): Payer: Medicare Other | Admitting: Psychiatry

## 2019-06-09 ENCOUNTER — Other Ambulatory Visit: Payer: Self-pay

## 2019-06-09 DIAGNOSIS — F3341 Major depressive disorder, recurrent, in partial remission: Secondary | ICD-10-CM

## 2019-06-09 DIAGNOSIS — N390 Urinary tract infection, site not specified: Secondary | ICD-10-CM | POA: Diagnosis not present

## 2019-06-09 DIAGNOSIS — Z9181 History of falling: Secondary | ICD-10-CM | POA: Diagnosis not present

## 2019-06-09 DIAGNOSIS — R338 Other retention of urine: Secondary | ICD-10-CM | POA: Diagnosis not present

## 2019-06-09 DIAGNOSIS — M1991 Primary osteoarthritis, unspecified site: Secondary | ICD-10-CM | POA: Diagnosis not present

## 2019-06-09 DIAGNOSIS — N401 Enlarged prostate with lower urinary tract symptoms: Secondary | ICD-10-CM | POA: Diagnosis not present

## 2019-06-09 DIAGNOSIS — F419 Anxiety disorder, unspecified: Secondary | ICD-10-CM | POA: Diagnosis not present

## 2019-06-09 DIAGNOSIS — S065X0D Traumatic subdural hemorrhage without loss of consciousness, subsequent encounter: Secondary | ICD-10-CM | POA: Diagnosis not present

## 2019-06-09 DIAGNOSIS — K219 Gastro-esophageal reflux disease without esophagitis: Secondary | ICD-10-CM | POA: Diagnosis not present

## 2019-06-09 DIAGNOSIS — E039 Hypothyroidism, unspecified: Secondary | ICD-10-CM | POA: Diagnosis not present

## 2019-06-09 DIAGNOSIS — F329 Major depressive disorder, single episode, unspecified: Secondary | ICD-10-CM | POA: Diagnosis not present

## 2019-06-09 DIAGNOSIS — I10 Essential (primary) hypertension: Secondary | ICD-10-CM | POA: Diagnosis not present

## 2019-06-09 DIAGNOSIS — E785 Hyperlipidemia, unspecified: Secondary | ICD-10-CM | POA: Diagnosis not present

## 2019-06-09 MED ORDER — VENLAFAXINE HCL ER 37.5 MG PO CP24: 37.5000 mg | ORAL_CAPSULE | Freq: Every day | ORAL | 6 refills | Status: DC

## 2019-06-09 MED ORDER — ALPRAZOLAM 0.25 MG PO TABS: ORAL_TABLET | ORAL | 4 refills | Status: DC

## 2019-06-09 MED ORDER — BUPROPION HCL ER (XL) 150 MG PO TB24: 150.0000 mg | ORAL_TABLET | Freq: Every day | ORAL | 2 refills | Status: DC

## 2019-06-09 NOTE — Progress Notes (Signed)
Patient ID: Benjamin Hahn, male   DOB: [birth year], 85 y.o.   MRN: [id number] [facility] MD/PA/NP OP Progress Note  06/09/2019 9:41 AM Benjamin Hahn  MRN:  [id number]  Chief Complaint: Major depression remission Subjective: Doing great  Today the patient was interviewed over the phone.  Since I have seen him he has fallen hit his head on the garage door went to see his primary care doctor scan determined that he had a subdural hematoma.  Approximately a month and a half ago he had brain surgery probably to deal with subdurals.  He went to rehab after being in the hospital 1 to 2 weeks.  He now is home still goes to the office but he has a physical therapist who comes to see him twice a week.  Amazingly he still continues to function.  His mood is good.  His practice is very minimal at this time.  He still insists on going in.  His walking is apparently better.  He is gaining weight.  He is sleeping fairly well.  In the hospital they appropriately discontinued his imipramine.  The patient is taking a very small dose of Xanax which she says helps him sleep.  The patient denies any psychotic symptoms.  The patient denies being depressed.  The patient denies anhedonia.  He drinks no alcohol no drugs.  The patient wants to continue working in some fashion.  I think he had a difficult time giving up all his work.  At this time his marriage is stable.  The patient denies any significant pain at this time.  The patient does say he is being treated at this time for a urinary tract infection.  Past Medical History:  Diagnosis Date  . Anxiety   . Arthritis    might be in back, no problems  . BPH (benign prostatic hyperplasia)   . Depression 1990s  . GERD (gastroesophageal reflux disease)    occasional  . Hypercholesteremia    controled  . Hypothyroid   . Shingles May 2014   "Right face, still has some"  . Temporal arteritis ([place])   . Transient blindness of both eyes     Past Surgical History:   Procedure Laterality Date  . ARTERY BIOPSY Right 06/20/2013   Procedure: BIOPSY TEMPORAL ARTERY RIGHT;  Surgeon: Earnstine Regal, MD;  Location: [facility];  Service: General;  Laterality: Right;  . CARDIAC CATHETERIZATION N/A 09/02/2015   Procedure: Left Heart Cath and Coronary Angiography;  Surgeon: Charolette Forward, MD;  Location: [place] [facility];  Service: Cardiovascular;  Laterality: N/A;  . CARDIAC CATHETERIZATION N/A 09/02/2015   Procedure: Coronary Stent Intervention;  Surgeon: Charolette Forward, MD;  Location: [place] [facility];  Service: Cardiovascular;  Laterality: N/A;  1.  mid RCA      (3.0/28mm Xience) 2.  Mid LAD      (3.0/23mm Xience)  . CRANIOTOMY N/A 04/19/2019   Procedure: CRANIOTOMY HEMATOMA EVACUATION SUBDURAL;  Surgeon: Jovita Gamma, MD;  Location: [place];  Service: Neurosurgery;  Laterality: N/A;  CRANIOTOMY HEMATOMA EVACUATION SUBDURAL  . None      Family Psychiatric History:   Family History:  Family History  Problem Relation Age of Onset  . Stroke Father        Died, 80s  . Stroke Sister        Living, 32  . Heart disease Mother        Died, 46  . Healthy Daughter   . Diabetes Maternal  Grandmother   . Hypertension Neg Hx     Social History:  Social History   Socioeconomic History  . Marital status: Married    Spouse name: Not on file  . Number of children: Not on file  . Years of education: Not on file  . Highest education level: Not on file  Occupational History  . Not on file  Social Needs  . Financial resource strain: Not on file  . Food insecurity    Worry: Not on file    Inability: Not on file  . Transportation needs    Medical: Not on file    Non-medical: Not on file  Tobacco Use  . Smoking status: Former Smoker    Types: Cigarettes    Quit date: 08/24/1966    Years since quitting: 52.8  . Smokeless tobacco: Never Used  Substance and Sexual Activity  . Alcohol use: Yes    Alcohol/week: 2.0 standard drinks    Types: 2 Shots of liquor per  week    Comment: socially  . Drug use: No  . Sexual activity: Not on file  Lifestyle  . Physical activity    Days per week: Not on file    Minutes per session: Not on file  . Stress: Not on file  Relationships  . Social Herbalist on phone: Not on file    Gets together: Not on file    Attends religious service: Not on file    Active member of club or organization: Not on file    Attends meetings of clubs or organizations: Not on file    Relationship status: Not on file  Other Topics Concern  . Not on file  Social History Narrative   Currently works as a Midwife.  Lives with wife and they have two healthy daughters.    Allergies:  Allergies  Allergen Reactions  . Lorazepam Anxiety    Metabolic Disorder Labs: Lab Results  Component Value Date   HGBA1C 5.5 04/14/2019   No results found for: PROLACTIN Lab Results  Component Value Date   CHOL 126 06/01/2019   TRIG 91.0 06/01/2019   HDL 56.50 06/01/2019   CHOLHDL 2 06/01/2019   VLDL 18.2 06/01/2019   LDLCALC 51 06/01/2019   LDLCALC 52 07/28/2018     Current Medications: Current Outpatient Medications  Medication Sig Dispense Refill  . acetaminophen (TYLENOL) 325 MG tablet Take 1-2 tablets (325-650 mg total) by mouth every 4 (four) hours as needed for mild pain or headache.    . ALPRAZolam (XANAX) 0.25 MG tablet 1  qhs 30 tablet 4  . atorvastatin (LIPITOR) 40 MG tablet Take 1 tablet (40 mg total) by mouth daily at 6 PM. 30 tablet 3  . buPROPion (WELLBUTRIN XL) 150 MG 24 hr tablet Take 1 tablet (150 mg total) by mouth daily. 2  qam 30 tablet 2  . levothyroxine (SYNTHROID) 88 MCG tablet TAKE 1 TABLET BY MOUTH DAILY. 90 tablet 1  . lidocaine (LIDODERM) 5 % Place 1 patch onto the skin daily. Remove & Discard patch within 12 hours or as directed by MD 30 patch 0  . linaclotide (LINZESS) 72 MCG capsule Take 1 capsule (72 mcg total) by mouth daily before breakfast. 84 capsule 0  . metoprolol succinate  (TOPROL-XL) 25 MG 24 hr tablet Take 0.5 tablets (12.5 mg total) by mouth daily. 30 tablet 0  . nitroGLYCERIN (NITROSTAT) 0.4 MG SL tablet Place 1 tablet (0.4 mg total) under the tongue  every 5 (five) minutes as needed for chest pain (CP or SOB). 25 tablet 12  . tamsulosin (FLOMAX) 0.4 MG CAPS capsule Take 1 capsule (0.4 mg total) by mouth daily after supper. 30 capsule 0  . venlafaxine XR (EFFEXOR-XR) 37.5 MG 24 hr capsule Take 1 capsule (37.5 mg total) by mouth daily. 30 capsule 6   No current facility-administered medications for this visit.     Neurologic: Headache: No Seizure: No Paresthesias: No  Musculoskeletal: Strength & Muscle Tone: within normal limits Gait & Station: normal Patient leans: NA  Psychiatric Specialty Exam: ROS  There were no vitals taken for this visit.There is no height or weight on file to calculate BMI.  General Appearance: Fairly Groomed  Eye Contact:  Good  Speech:  Clear and Coherent  Volume:  Normal  Mood:  Euthymic  Affect:  Appropriate  Thought Process:  Coherent  Orientation:  Full (Time, Place, and Person)  Thought Content:  WDL  Suicidal Thoughts:  No  Homicidal Thoughts:  No  Memory:  NA  Judgement:  Good  Insight:  Good  Psychomotor Activity:  Normal  Concentration:  Good  Recall:  Good  Fund of Knowledge: Good  Language: Good  Akathisia:  No  Handed:  Right  AIMS (if indicated):    Assets:  Desire for Improvement  ADL's:  Intact  Cognition: WNL  Sleep:      Treatment/plan  Today the patient #1 problem is a history of clinical major depression.  Throughout all the trauma having brain surgery and having falls he continues to take very low-dose Effexor 37.5 mg and Wellbutrin 150 mg XL.  It should be noted this patient has never had a seizure.  With these med patient the patient seems to be actually stable emotionally.  His second problem is that of insomnia.  The patient takes a very small dose of Xanax 0.25 mg at night.  His  imipramine has been discontinued.  Patient is not suicidal.  Generally he is functioning fairly well.  He continues to attempt to be very independent.  He will be seen again hopefully in this office in 3 months. Jerral Ralph, MD 06/09/2019, 9:41 AM [facility] MD Progress Note  06/09/2019 9:41 AM Benjamin Hahn  MRN:  [id number] Subjective:  Feels well Principal Problem: Major depression, chronic, mild/residual Diagnosis:  Major depression, recurrent residual Today the patient is doing fairly well. He's been having some medical problems. He recently went for Thanksgiving to a length but unfortunately had a urinary tract infection. He also since then has been coughing and recently started on antibiotic. Emotionally he is fairly stable. He denies daily depression. He stays very active at work. He's been unable to exercise. The patient is sleeping well and has an increase in his appetite. His energy is good. Is no problems thinking or concentrating. He is recently having a productive cough but is no shortness of breath. He denies any chest pain. Is a stable relationship with his wife. His kidneys are good. He has 4 grandchildren. Everybody in his home is good. He only issue is that a number mornings he awakens and he feels oversedated. It occurs point of his wife feels uncomfortable with him driving will taken to work. Today reviewed his medications and is evident that he likely needs reduction. He also notes that his memory has change in his and is good. 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 Patient  Active Problem List   Diagnosis Date Noted  . Deficiency anemia [D53.9] 06/01/2019  . Vitamin D deficiency [E55.9] 06/01/2019  . Chronic idiopathic constipation [K59.04] 06/01/2019  . Traumatic subdural hematoma ([place]) [S06.5X9A] 04/21/2019  . Hyperglycemia [R73.9] 04/15/2019  . Mild intermittent asthma with acute exacerbation [J45.21] 02/08/2018  .  Hypothyroidism [E03.9] 03/14/2013  . Hyperlipidemia LDL goal <70 [E78.5] 03/14/2013  . BPH (benign prostatic hypertrophy) [N40.0] 03/14/2013  . Hypogonadism, male [E29.1] 03/14/2013  . Erectile dysfunction [N52.9] 03/14/2013   Total Time spent with patient: 30 minutes  Past Psychiatric History:   Past Medical History:  Past Medical History:  Diagnosis Date  . Anxiety   . Arthritis    might be in back, no problems  . BPH (benign prostatic hyperplasia)   . Depression 1990s  . GERD (gastroesophageal reflux disease)    occasional  . Hypercholesteremia    controled  . Hypothyroid   . Shingles May 2014   "Right face, still has some"  . Temporal arteritis ([place])   . Transient blindness of both eyes     Past Surgical History:  Procedure Laterality Date  . ARTERY BIOPSY Right 06/20/2013   Procedure: BIOPSY TEMPORAL ARTERY RIGHT;  Surgeon: Earnstine Regal, MD;  [facility];  Service: General;  Laterality: Right;  . CARDIAC CATHETERIZATION N/A 09/02/2015   Procedure: Left Heart Cath and Coronary Angiography;  Surgeon: Charolette Forward, MD;  Location: [place] [facility];  Service: Cardiovascular;  Laterality: N/A;  . CARDIAC CATHETERIZATION N/A 09/02/2015   Procedure: Coronary Stent Intervention;  Surgeon: Charolette Forward, MD;  Location: [place] [facility];  Service: Cardiovascular;  Laterality: N/A;  1.  mid RCA      (3.0/28mm Xience) 2.  Mid LAD      (3.0/23mm Xience)  . CRANIOTOMY N/A 04/19/2019   Procedure: CRANIOTOMY HEMATOMA EVACUATION SUBDURAL;  Surgeon: Jovita Gamma, MD;  Location: [place];  Service: Neurosurgery;  Laterality: N/A;  CRANIOTOMY HEMATOMA EVACUATION SUBDURAL  . None     Family History:  Family History  Problem Relation Age of Onset  . Stroke Father        Died, 80s  . Stroke Sister        Living, 37  . Heart disease Mother        Died, 35  . Healthy Daughter   . Diabetes Maternal Grandmother   . Hypertension Neg Hx    Family Psychiatric  History:  Social  History:  Social History   Substance and Sexual Activity  Alcohol Use Yes  . Alcohol/week: 2.0 standard drinks  . Types: 2 Shots of liquor per week   Comment: socially     Social History   Substance and Sexual Activity  Drug Use No    Social History   Socioeconomic History  . Marital status: Married    Spouse name: Not on file  . Number of children: Not on file  . Years of education: Not on file  . Highest education level: Not on file  Occupational History  . Not on file  Social Needs  . Financial resource strain: Not on file  . Food insecurity    Worry: Not on file    Inability: Not on file  . Transportation needs    Medical: Not on file    Non-medical: Not on file  Tobacco Use  . Smoking status: Former Smoker    Types: Cigarettes    Quit date: 08/24/1966    Years since quitting: 52.8  .  Smokeless tobacco: Never Used  Substance and Sexual Activity  . Alcohol use: Yes    Alcohol/week: 2.0 standard drinks    Types: 2 Shots of liquor per week    Comment: socially  . Drug use: No  . Sexual activity: Not on file  Lifestyle  . Physical activity    Days per week: Not on file    Minutes per session: Not on file  . Stress: Not on file  Relationships  . Social Herbalist on phone: Not on file    Gets together: Not on file    Attends religious service: Not on file    Active member of club or organization: Not on file    Attends meetings of clubs or organizations: Not on file    Relationship status: Not on file  Other Topics Concern  . Not on file  Social History Narrative   Currently works as a Midwife.  Lives with wife and they have two healthy daughters.   Additional Social History:                         Sleep: Good  Appetite:  Good  Current Medications: Current Outpatient Medications  Medication Sig Dispense Refill  . acetaminophen (TYLENOL) 325 MG tablet Take 1-2 tablets (325-650 mg total) by mouth every 4 (four) hours as  needed for mild pain or headache.    . ALPRAZolam (XANAX) 0.25 MG tablet 1  qhs 30 tablet 4  . atorvastatin (LIPITOR) 40 MG tablet Take 1 tablet (40 mg total) by mouth daily at 6 PM. 30 tablet 3  . buPROPion (WELLBUTRIN XL) 150 MG 24 hr tablet Take 1 tablet (150 mg total) by mouth daily. 2  qam 30 tablet 2  . levothyroxine (SYNTHROID) 88 MCG tablet TAKE 1 TABLET BY MOUTH DAILY. 90 tablet 1  . lidocaine (LIDODERM) 5 % Place 1 patch onto the skin daily. Remove & Discard patch within 12 hours or as directed by MD 30 patch 0  . linaclotide (LINZESS) 72 MCG capsule Take 1 capsule (72 mcg total) by mouth daily before breakfast. 84 capsule 0  . metoprolol succinate (TOPROL-XL) 25 MG 24 hr tablet Take 0.5 tablets (12.5 mg total) by mouth daily. 30 tablet 0  . nitroGLYCERIN (NITROSTAT) 0.4 MG SL tablet Place 1 tablet (0.4 mg total) under the tongue every 5 (five) minutes as needed for chest pain (CP or SOB). 25 tablet 12  . tamsulosin (FLOMAX) 0.4 MG CAPS capsule Take 1 capsule (0.4 mg total) by mouth daily after supper. 30 capsule 0  . venlafaxine XR (EFFEXOR-XR) 37.5 MG 24 hr capsule Take 1 capsule (37.5 mg total) by mouth daily. 30 capsule 6   No current facility-administered medications for this visit.     Lab Results:  No results found for this or any previous visit (from the past 48 hour(s)).  Physical Findings: AIMS:  , ,  ,  ,    CIWA:    COWS:     Musculoskeletal: Strength & Muscle Tone: within normal limits Gait & Station: normal Patient leans: Right  Psychiatric Specialty Exam: ROS  There were no vitals taken for this visit.There is no height or weight on file to calculate BMI.  General Appearance: Negative  Eye Contact::  Good  Speech:  Clear and Coherent  Volume:  Normal  Mood:  NA  Affect:  Appropriate  Thought Process:  Coherent  Orientation:  Full (Time,  Place, and Person)  Thought Content:  WDL  Suicidal Thoughts:  No  Homicidal Thoughts:  No  Memory:  NA   Judgement:  Good  Insight:  Good  Psychomotor Activity:  Normal  Concentration:  Good  Recall:  Good  Fund of Knowledge:Good  Language: Good  Akathisia:  No  Handed:  Right  AIMS (if indicated):     Assets:  Desire for Improvement  ADL's:  Intact  Cognition: WNL  Sleep:      [facility]. [facility] MD Progress Note  06/09/2019 9:41 AM Benjamin Hahn  MRN:  [id number] Subjective:  Feels well Principal Problem: Major depression, chronic, mild/residual Diagnosis:  Major depression, recurrent residual Today the patient is doing fairly well. He's been having some medical problems. He recently went for Thanksgiving to a length but unfortunately had a urinary tract infection. He also since then has been coughing and recently started on antibiotic. Emotionally he is fairly stable. He denies daily depression. He stays very active at work. He's been unable to exercise. The patient is sleeping well and has an increase in his appetite. His energy is good. Is no problems thinking or concentrating. He is recently having a productive cough but is no shortness of breath. He denies any chest pain. Is a stable relationship with his wife. His kidneys are good. He has 4 grandchildren. Everybody in his home is good. He only issue is that a number mornings he awakens and he feels oversedated. It occurs point of his wife feels uncomfortable with him driving will taken to work. Today reviewed his medications and is evident that he likely needs reduction. He also notes that his memory has change in his and is good. 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 Patient Active Problem List   Diagnosis Date Noted  . Deficiency anemia [D53.9] 06/01/2019  . Vitamin D deficiency [E55.9] 06/01/2019  . Chronic idiopathic constipation [K59.04] 06/01/2019  . Traumatic subdural hematoma ([place]) [S06.5X9A] 04/21/2019  . Hyperglycemia [R73.9] 04/15/2019  . Mild intermittent  asthma with acute exacerbation [J45.21] 02/08/2018  . Hypothyroidism [E03.9] 03/14/2013  . Hyperlipidemia LDL goal <70 [E78.5] 03/14/2013  . BPH (benign prostatic hypertrophy) [N40.0] 03/14/2013  . Hypogonadism, male [E29.1] 03/14/2013  . Erectile dysfunction [N52.9] 03/14/2013   Total Time spent with patient: 30 minutes  Past Psychiatric History:   Past Medical History:  Past Medical History:  Diagnosis Date  . Anxiety   . Arthritis    might be in back, no problems  . BPH (benign prostatic hyperplasia)   . Depression 1990s  . GERD (gastroesophageal reflux disease)    occasional  . Hypercholesteremia    controled  . Hypothyroid   . Shingles May 2014   "Right face, still has some"  . Temporal arteritis ([place])   . Transient blindness of both eyes     Past Surgical History:  Procedure Laterality Date  . ARTERY BIOPSY Right 06/20/2013   Procedure: BIOPSY TEMPORAL ARTERY RIGHT;  Surgeon: Earnstine Regal, MD;  [facility];  Service: General;  Laterality: Right;  . CARDIAC CATHETERIZATION N/A 09/02/2015   Procedure: Left Heart Cath and Coronary Angiography;  Surgeon: Charolette Forward, MD;  Location: [place] [facility];  Service: Cardiovascular;  Laterality: N/A;  . CARDIAC CATHETERIZATION N/A 09/02/2015   Procedure: Coronary Stent Intervention;  Surgeon: Charolette Forward, MD;  Location: [place] [facility];  Service: Cardiovascular;  Laterality: N/A;  1.  mid  RCA      (3.0/28mm Xience) 2.  Mid LAD      (3.0/23mm Xience)  . CRANIOTOMY N/A 04/19/2019   Procedure: CRANIOTOMY HEMATOMA EVACUATION SUBDURAL;  Surgeon: Jovita Gamma, MD;  Location: [place];  Service: Neurosurgery;  Laterality: N/A;  CRANIOTOMY HEMATOMA EVACUATION SUBDURAL  . None     Family History:  Family History  Problem Relation Age of Onset  . Stroke Father        Died, 80s  . Stroke Sister        Living, 36  . Heart disease Mother        Died, 24  . Healthy Daughter   . Diabetes Maternal Grandmother   .  Hypertension Neg Hx    Family Psychiatric  History:  Social History:  Social History   Substance and Sexual Activity  Alcohol Use Yes  . Alcohol/week: 2.0 standard drinks  . Types: 2 Shots of liquor per week   Comment: socially     Social History   Substance and Sexual Activity  Drug Use No    Social History   Socioeconomic History  . Marital status: Married    Spouse name: Not on file  . Number of children: Not on file  . Years of education: Not on file  . Highest education level: Not on file  Occupational History  . Not on file  Social Needs  . Financial resource strain: Not on file  . Food insecurity    Worry: Not on file    Inability: Not on file  . Transportation needs    Medical: Not on file    Non-medical: Not on file  Tobacco Use  . Smoking status: Former Smoker    Types: Cigarettes    Quit date: 08/24/1966    Years since quitting: 52.8  . Smokeless tobacco: Never Used  Substance and Sexual Activity  . Alcohol use: Yes    Alcohol/week: 2.0 standard drinks    Types: 2 Shots of liquor per week    Comment: socially  . Drug use: No  . Sexual activity: Not on file  Lifestyle  . Physical activity    Days per week: Not on file    Minutes per session: Not on file  . Stress: Not on file  Relationships  . Social Herbalist on phone: Not on file    Gets together: Not on file    Attends religious service: Not on file    Active member of club or organization: Not on file    Attends meetings of clubs or organizations: Not on file    Relationship status: Not on file  Other Topics Concern  . Not on file  Social History Narrative   Currently works as a Midwife.  Lives with wife and they have two healthy daughters.   Additional Social History:                         Sleep: Good  Appetite:  Good  Current Medications: Current Outpatient Medications  Medication Sig Dispense Refill  . acetaminophen (TYLENOL) 325 MG tablet Take 1-2  tablets (325-650 mg total) by mouth every 4 (four) hours as needed for mild pain or headache.    . ALPRAZolam (XANAX) 0.25 MG tablet 1  qhs 30 tablet 4  . atorvastatin (LIPITOR) 40 MG tablet Take 1 tablet (40 mg total) by mouth daily at 6 PM. 30 tablet 3  . buPROPion (  WELLBUTRIN XL) 150 MG 24 hr tablet Take 1 tablet (150 mg total) by mouth daily. 2  qam 30 tablet 2  . levothyroxine (SYNTHROID) 88 MCG tablet TAKE 1 TABLET BY MOUTH DAILY. 90 tablet 1  . lidocaine (LIDODERM) 5 % Place 1 patch onto the skin daily. Remove & Discard patch within 12 hours or as directed by MD 30 patch 0  . linaclotide (LINZESS) 72 MCG capsule Take 1 capsule (72 mcg total) by mouth daily before breakfast. 84 capsule 0  . metoprolol succinate (TOPROL-XL) 25 MG 24 hr tablet Take 0.5 tablets (12.5 mg total) by mouth daily. 30 tablet 0  . nitroGLYCERIN (NITROSTAT) 0.4 MG SL tablet Place 1 tablet (0.4 mg total) under the tongue every 5 (five) minutes as needed for chest pain (CP or SOB). 25 tablet 12  . tamsulosin (FLOMAX) 0.4 MG CAPS capsule Take 1 capsule (0.4 mg total) by mouth daily after supper. 30 capsule 0  . venlafaxine XR (EFFEXOR-XR) 37.5 MG 24 hr capsule Take 1 capsule (37.5 mg total) by mouth daily. 30 capsule 6   No current facility-administered medications for this visit.     Lab Results:  No results found for this or any previous visit (from the past 48 hour(s)).  Physical Findings: AIMS:  , ,  ,  ,    CIWA:    COWS:     Musculoskeletal: Strength & Muscle Tone: within normal limits Gait & Station: normal Patient leans: Right  Psychiatric Specialty Exam: ROS  There were no vitals taken for this visit.There is no height or weight on file to calculate BMI.  General Appearance: Negative  Eye Contact::  Good  Speech:  Clear and Coherent  Volume:  Normal  Mood:  NA  Affect:  Appropriate  Thought Process:  Coherent  Orientation:  Full (Time, Place, and Person)  Thought Content:  WDL  Suicidal  Thoughts:  No  Homicidal Thoughts:  No  Memory:  NA  Judgement:  Good  Insight:  Good  Psychomotor Activity:  Normal  Concentration:  Good  Recall:  Good  Fund of Knowledge:Good  Language: Good  Akathisia:  No  Handed:  Right  AIMS (if indicated):     Assets:  Desire for Improvement  ADL's:  Intact  Cognition: WNL  Sleep:      Treatment Plan Summary: This patient has 2 stable problems.  His first problem is that of clinical depression.  He takes Effexor and does very well.  He also takes a small dose of imipramine at night.  His second problem is that of insomnia.  He takes melatonin and at times he takes Restoril 15 mg.  Today he is assess to slightly increase his Xanax.  We will go ahead and increase it to a dose of 0.25 mg taking 2 at night.  Patient is no longer drinking.  He is functioning very well.  He will return to see me in 3 months.

## 2019-06-12 ENCOUNTER — Other Ambulatory Visit (HOSPITAL_COMMUNITY): Payer: Self-pay

## 2019-06-12 DIAGNOSIS — E039 Hypothyroidism, unspecified: Secondary | ICD-10-CM | POA: Diagnosis not present

## 2019-06-12 DIAGNOSIS — N401 Enlarged prostate with lower urinary tract symptoms: Secondary | ICD-10-CM | POA: Diagnosis not present

## 2019-06-12 DIAGNOSIS — N390 Urinary tract infection, site not specified: Secondary | ICD-10-CM | POA: Diagnosis not present

## 2019-06-12 DIAGNOSIS — I10 Essential (primary) hypertension: Secondary | ICD-10-CM | POA: Diagnosis not present

## 2019-06-12 DIAGNOSIS — S065X0D Traumatic subdural hemorrhage without loss of consciousness, subsequent encounter: Secondary | ICD-10-CM | POA: Diagnosis not present

## 2019-06-12 DIAGNOSIS — E785 Hyperlipidemia, unspecified: Secondary | ICD-10-CM | POA: Diagnosis not present

## 2019-06-12 MED ORDER — BUPROPION HCL ER (XL) 300 MG PO TB24: 300.0000 mg | ORAL_TABLET | Freq: Every day | ORAL | 2 refills | Status: DC

## 2019-06-14 DIAGNOSIS — N401 Enlarged prostate with lower urinary tract symptoms: Secondary | ICD-10-CM | POA: Diagnosis not present

## 2019-06-14 DIAGNOSIS — R338 Other retention of urine: Secondary | ICD-10-CM

## 2019-06-14 DIAGNOSIS — S065X0D Traumatic subdural hemorrhage without loss of consciousness, subsequent encounter: Secondary | ICD-10-CM | POA: Diagnosis not present

## 2019-06-14 DIAGNOSIS — E039 Hypothyroidism, unspecified: Secondary | ICD-10-CM | POA: Diagnosis not present

## 2019-06-14 DIAGNOSIS — Z9181 History of falling: Secondary | ICD-10-CM

## 2019-06-14 DIAGNOSIS — M1991 Primary osteoarthritis, unspecified site: Secondary | ICD-10-CM

## 2019-06-14 DIAGNOSIS — E785 Hyperlipidemia, unspecified: Secondary | ICD-10-CM | POA: Diagnosis not present

## 2019-06-14 DIAGNOSIS — F419 Anxiety disorder, unspecified: Secondary | ICD-10-CM

## 2019-06-14 DIAGNOSIS — K219 Gastro-esophageal reflux disease without esophagitis: Secondary | ICD-10-CM

## 2019-06-14 DIAGNOSIS — F329 Major depressive disorder, single episode, unspecified: Secondary | ICD-10-CM

## 2019-06-14 DIAGNOSIS — N390 Urinary tract infection, site not specified: Secondary | ICD-10-CM | POA: Diagnosis not present

## 2019-06-14 DIAGNOSIS — I10 Essential (primary) hypertension: Secondary | ICD-10-CM | POA: Diagnosis not present

## 2019-06-16 DIAGNOSIS — N390 Urinary tract infection, site not specified: Secondary | ICD-10-CM | POA: Diagnosis not present

## 2019-06-16 DIAGNOSIS — I10 Essential (primary) hypertension: Secondary | ICD-10-CM | POA: Diagnosis not present

## 2019-06-16 DIAGNOSIS — E785 Hyperlipidemia, unspecified: Secondary | ICD-10-CM | POA: Diagnosis not present

## 2019-06-16 DIAGNOSIS — S065X0D Traumatic subdural hemorrhage without loss of consciousness, subsequent encounter: Secondary | ICD-10-CM | POA: Diagnosis not present

## 2019-06-16 DIAGNOSIS — N401 Enlarged prostate with lower urinary tract symptoms: Secondary | ICD-10-CM | POA: Diagnosis not present

## 2019-06-16 DIAGNOSIS — E039 Hypothyroidism, unspecified: Secondary | ICD-10-CM | POA: Diagnosis not present

## 2019-06-20 ENCOUNTER — Telehealth: Payer: Self-pay

## 2019-06-20 NOTE — Telephone Encounter (Signed)
No It was most likely sent to neurosurgery  TJ

## 2019-06-20 NOTE — Telephone Encounter (Signed)
Have you seen any paperwork for this patient?

## 2019-06-20 NOTE — Telephone Encounter (Signed)
Copied from [place] [phone]. Topic: General - Other >> Jun 20, 2019 10:08 AM Rutherford Nail, NT wrote: Reason for CRM: Patient's wife calling and states that [facility] has faxed paperwork over for the provider to fill out. States that they are needing this sent back so patient can be reimbursed. Please advise.  CB#: [phone] Claim#: [id number] Policy#: [id number] >> Jun 20, 2019 10:47 AM Para Skeans A wrote: I do not have these.[name]

## 2019-06-21 NOTE — Telephone Encounter (Signed)
Adele (pt wife) contacted and informed that we do not have the forms. Pt spouse stated that the neurosurgeon and PCP would have forms to fill out.   She will have them refax.

## 2019-06-22 ENCOUNTER — Ambulatory Visit: Payer: Medicare Other | Admitting: Neurology

## 2019-06-23 NOTE — Telephone Encounter (Signed)
Yes I will

## 2019-07-03 DIAGNOSIS — N401 Enlarged prostate with lower urinary tract symptoms: Secondary | ICD-10-CM | POA: Diagnosis not present

## 2019-07-03 DIAGNOSIS — R3914 Feeling of incomplete bladder emptying: Secondary | ICD-10-CM | POA: Diagnosis not present

## 2019-07-25 DIAGNOSIS — N3941 Urge incontinence: Secondary | ICD-10-CM | POA: Diagnosis not present

## 2019-07-25 DIAGNOSIS — R350 Frequency of micturition: Secondary | ICD-10-CM | POA: Diagnosis not present

## 2019-07-25 DIAGNOSIS — R3914 Feeling of incomplete bladder emptying: Secondary | ICD-10-CM | POA: Diagnosis not present

## 2019-07-25 DIAGNOSIS — N401 Enlarged prostate with lower urinary tract symptoms: Secondary | ICD-10-CM | POA: Diagnosis not present

## 2019-08-02 ENCOUNTER — Telehealth (HOSPITAL_COMMUNITY): Payer: Self-pay | Admitting: *Deleted

## 2019-08-02 ENCOUNTER — Other Ambulatory Visit (HOSPITAL_COMMUNITY): Payer: Self-pay | Admitting: *Deleted

## 2019-08-02 MED ORDER — PREGABALIN 75 MG PO CAPS: 75.0000 mg | ORAL_CAPSULE | Freq: Three times a day (TID) | ORAL | 2 refills | Status: DC

## 2019-08-02 NOTE — Telephone Encounter (Signed)
Writer called and spoke with pt regarding his request for refill for Restoril. Per MD nurse asked pt who prescribed this medication as Dr. Casimiro Needle has not prescribed this medication for pt. Pt is call nurse back in the a.m. with that information.

## 2019-08-02 NOTE — Telephone Encounter (Signed)
Pt called requesting refill of Restoril 15mg . I don not see this on his med list. He says he was taking 30mg  but that was too strong. Current medications are as follows with with the exception of the Linzess.:                 Name Dose, Frequency Refills               Outpatient and Clinic-Administered Medications   acetaminophen (TYLENOL) 325 MG tablet 325-650 mg, Every 4 hours PRN        Summary: Take 1-2 tablets (325-650 mg total) by mouth every 4 (four) hours as needed for mild pain or headache., Starting Fri 05/05/2019, OTC     ALPRAZolam (XANAX) 0.25 MG tablet  4 ordered       Summary: 1 qhs, Normal     atorvastatin (LIPITOR) 40 MG tablet 40 mg, Daily-1800 3 ordered       Summary: Take 1 tablet (40 mg total) by mouth daily at 6 PM., Starting Fri 05/05/2019, Normal     buPROPion (WELLBUTRIN XL) 300 MG 24 hr tablet 300 mg, Daily 2 ordered       Summary: Take 1 tablet (300 mg total) by mouth daily., Starting Mon 06/12/2019, Normal     levothyroxine (SYNTHROID) 88 MCG tablet  1 ordered       Summary: TAKE 1 TABLET BY MOUTH DAILY., Normal     lidocaine (LIDODERM) 5 % 1 patch, Every 24 hours 0 ordered       Summary: Place 1 patch onto the skin daily. Remove & Discard patch within 12 hours or as directed by MD, Starting Fri 05/05/2019, Normal     linaclotide (LINZESS) 72 MCG capsule 72 mcg, Daily before breakfast 0 ordered       Summary: Take 1 capsule (72 mcg total) by mouth daily before breakfast., Starting Thu 06/01/2019, Sample     metoprolol succinate (TOPROL-XL) 25 MG 24 hr tablet 12.5 mg, Daily 0 ordered       Summary: Take 0.5 tablets (12.5 mg total) by mouth daily., Starting Sat 05/06/2019, Normal     nitroGLYCERIN (NITROSTAT) 0.4 MG SL tablet 0.4 mg, Every 5 min PRN 12 ordered       Summary: Place 1 tablet (0.4 mg total) under the tongue every 5 (five) minutes as needed for chest pain (CP or SOB)., Starting Tue 09/03/2015, Normal     tamsulosin (FLOMAX) 0.4 MG CAPS capsule 0.4  mg, Daily after supper 0 ordered       Summary: Take 1 capsule (0.4 mg total) by mouth daily after supper., Starting Fri 05/05/2019, Normal     venlafaxine XR (EFFEXOR-XR) 37.5 MG 24 hr capsule 37.5 mg, Daily 6 ordered        Dose, Route, Frequency: 37.5 mg, Oral, Daily Start: 06/09/2019 End: 06/08/2020 Ord/Sold: 06/09/2019 (O) Report Taking:  Long-term:  Pharmacy: [facility], [place]. Med Dose History ChangeDiscontinue     Summary: Take 1 capsule (37.5 mg total) by mouth daily., Starting Fri 06/09/2019, Until Sat 06/08/2020, Normal     Patient Sig: Take 1 capsule (37.5 mg total) by mouth daily.     Ordered on: 06/09/2019     Authorized by: Norma Fredrickson     Dispense: 30 capsule

## 2019-08-02 NOTE — Telephone Encounter (Signed)
Writer called in RX fro Lyrica 75mg  to be taken 1 tab tid with 90 dispensed with 0 refill so s to assess if pt is getting results and tolerating medication on upcoming visit on 09/13/19.

## 2019-08-04 ENCOUNTER — Telehealth (HOSPITAL_COMMUNITY): Payer: Self-pay | Admitting: *Deleted

## 2019-08-04 NOTE — Telephone Encounter (Signed)
Writer spoke with pt who says that he will not take the Lyrica and requests "refill" of restoril 15mg . Please review and advise.

## 2019-08-15 ENCOUNTER — Ambulatory Visit: Payer: Medicare Other | Attending: Physician Assistant | Admitting: Physical Therapy

## 2019-08-15 ENCOUNTER — Other Ambulatory Visit: Payer: Self-pay

## 2019-08-15 ENCOUNTER — Encounter: Payer: Self-pay | Admitting: Physical Therapy

## 2019-08-15 DIAGNOSIS — R2689 Other abnormalities of gait and mobility: Secondary | ICD-10-CM | POA: Diagnosis not present

## 2019-08-15 DIAGNOSIS — R2681 Unsteadiness on feet: Secondary | ICD-10-CM | POA: Insufficient documentation

## 2019-08-15 DIAGNOSIS — M6281 Muscle weakness (generalized): Secondary | ICD-10-CM | POA: Insufficient documentation

## 2019-08-15 NOTE — Patient Instructions (Addendum)
Toe / Heel Raise    Hold onto counter.  Gently rock back on heels and raise toes. Hold for 3 seconds, then rock forward on toes and raise heels.  Hold for 3 seconds Repeat sequence __2 sets of 10__ times per session. Do __5__ sessions per week.  Copyright  VHI. All rights reserved.  Feet Partial Heel-Toe, Varied Arm Positions - Eyes Open    HOLD ONTO THE COUNTER.  With eyes open, right foot partially in front of the other, look straight ahead at a stationary object. Hold __10__ seconds. Repeat __3__ times per session, each foot position. Do __5__ sessions per week.  Copyright  VHI. All rights reserved.

## 2019-08-15 NOTE — Therapy (Signed)
[facility] [address] [place], [place], [address] Phone: [phone]   Fax:  [phone]  Physical Therapy Evaluation  Patient Details  Name: Benjamin Hahn MRN: [id number] Date of Birth: [birth year] Referring Provider (PT): Jovita Gamma   Encounter Date: 08/15/2019  PT End of Session - 08/15/19 0912    Visit Number  1    Number of Visits  18    Date for PT Re-Evaluation  11/13/19    Authorization Type  Medicare/BCBS-Will need 10th visit progress note    PT Start Time  0805    PT Stop Time  0858    PT Time Calculation (min)  53 min    Activity Tolerance  Patient tolerated treatment well    Behavior During Therapy  [facility] for tasks assessed/performed       Past Medical History:  Diagnosis Date  . Anxiety   . Arthritis    might be in back, no problems  . BPH (benign prostatic hyperplasia)   . Depression 1990s  . GERD (gastroesophageal reflux disease)    occasional  . Hypercholesteremia    controled  . Hypothyroid   . Shingles May 2014   "Right face, still has some"  . Temporal arteritis ([place])   . Transient blindness of both eyes     Past Surgical History:  Procedure Laterality Date  . ARTERY BIOPSY Right 06/20/2013   Procedure: BIOPSY TEMPORAL ARTERY RIGHT;  Surgeon: Earnstine Regal, MD;  Location: [facility];  Service: General;  Laterality: Right;  . CARDIAC CATHETERIZATION N/A 09/02/2015   Procedure: Left Heart Cath and Coronary Angiography;  Surgeon: Charolette Forward, MD;  Location: [facility];  Service: Cardiovascular;  Laterality: N/A;  . CARDIAC CATHETERIZATION N/A 09/02/2015   Procedure: Coronary Stent Intervention;  Surgeon: Charolette Forward, MD;  Location: [facility];  Service: Cardiovascular;  Laterality: N/A;  1.  mid RCA      (3.0/28mm Xience) 2.  Mid LAD      (3.0/23mm Xience)  . CRANIOTOMY N/A 04/19/2019   Procedure: CRANIOTOMY HEMATOMA EVACUATION SUBDURAL;  Surgeon: Jovita Gamma, MD;  Location: [facility];  Service: Neurosurgery;  Laterality: N/A;  CRANIOTOMY HEMATOMA EVACUATION SUBDURAL  . None      There were no vitals filed for this visit.   Subjective Assessment - 08/15/19 0808    Subjective  Pt had subdural hematoma with craniotomy 04/21/2019, has had home health after being d/c from hospital.  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  He and wife are still concerned about walking, balance, and strength.  Prior to hospitalization, he was independent.  He uses RW at home most of the time; he presents to OPPT eval today with daugther and no assistive device.    Patient is accompained by:  Family member   daughter   Patient Stated Goals  Pt's goals are to improve walking, balance, strength.  Would like to get back to driving.    Currently in Pain?  No/denies         [facility] PT Assessment - 08/15/19 0814      Assessment   Medical Diagnosis  gait, s/p subdural hematoma    Referring Provider (PT)  Jovita Gamma    Onset Date/Surgical Date  04/21/19    Hand Dominance  Right      Precautions   Precautions  Fall    Precaution Comments  No driving  Balance Screen   Has the patient fallen in the past 6 months  Yes   2 since the hospitalization   How many times?  5   driveway, steps, backyard, squatting to pick up something   Has the patient had a decrease in activity level because of a fear of falling?   Yes    Is the patient reluctant to leave their home because of a fear of falling?   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    Leisure  Enjoyed walking in the neighborhood; went to [place] ([employer]) workout room-recumbent bike, leg press (2 flights of steps to get up to that room)      Posture/Postural Control   Posture/Postural Control  Postural limitations    Postural Limitations  Rounded Shoulders;Forward head;Posterior pelvic tilt    Posture Comments  In standing, tends to look down at floor      ROM / Strength   AROM / PROM / Strength  Strength      Strength   Overall Strength  Deficits    Strength Assessment Site  Hip;Knee;Ankle    Right/Left Hip  Right;Left    Right Hip Flexion  4/5    Left Hip Flexion  4/5    Right/Left Knee  Right;Left    Right Knee Flexion  4/5    Right Knee Extension  4/5    Left Knee Flexion  4/5    Left Knee Extension  3+/5    Right/Left Ankle  Right;Left    Right Ankle Dorsiflexion  3+/5    Left Ankle Dorsiflexion  3+/5      Transfers   Transfers  Sit to Stand;Stand to Sit    Sit to Stand  5: Supervision;Without upper extremity assist;From chair/3-in-1    Sit to Stand Details (indicate cue type and reason)  One episode of having to sit due to not able to fully stand without UE support during 5x sit<>stand    Five time sit to stand comments   13.69    Stand to Sit  5: Supervision;Without upper extremity assist;To chair/3-in-1    Comments  Reports normally using UEs for support for sit<>stand.  During remainder of eval, pt uses UE support with sit<>stand      Ambulation/Gait   Ambulation/Gait  Yes    Ambulation/Gait Assistance  4: Min guard    Ambulation Distance (Feet)  150 Feet    Assistive device  None    Gait Pattern  Step-through pattern;Decreased step length - right;Decreased step length - left;Narrow base of support;Trunk flexed   Veering to the left at times   Ambulation Surface  Level;Indoor    Gait velocity  15.22 sec = 2.16 ft/sec      Standardized Balance  Assessment   Standardized Balance Assessment  Berg Balance Test;Timed Up  and Go Test      Berg Balance Test   Sit to Stand  Able to stand  independently using hands    Standing Unsupported  Able to stand 2 minutes with supervision    Sitting with Back Unsupported but Feet Supported on Floor or Stool  Able to sit safely and securely 2 minutes    Stand to Sit  Controls descent by using hands    Transfers  Able to transfer safely, minor use of hands    Standing Unsupported with Eyes Closed  Able to stand 10 seconds with supervision    Standing Unsupported with Feet Together  Able to place feet together independently and stand for 1 minute with supervision    From Standing, Reach Forward with Outstretched Arm  Can reach forward >5 cm safely (2")   3"   From Standing Position, Pick up Object from Floor  Able to pick up shoe, needs supervision    From Standing Position, Turn to Look Behind Over each Shoulder  Needs supervision when turning    Turn 360 Degrees  Needs close supervision or verbal cueing    Standing Unsupported, Alternately Place Feet on Step/Stool  Able to complete >2 steps/needs minimal assist    Standing Unsupported, One Foot in Front  Able to take small step independently and hold 30 seconds    Standing on One Leg  Unable to try or needs assist to prevent fall    Total Score  33    Berg comment:  Scores <45/56 indicate increased fall risk; <37/56 indicate need for RW for gait stability.      Timed Up and Go Test   TUG  Normal TUG    Normal TUG (seconds)  17.12    TUG Comments  Scores >13.5 seconds indicate increased fall risk      High Level Balance   High Level Balance Comments  Attempted tandem stance:  pt unable to achieve either leg position                Objective measurements completed on examination: See above findings.     Pt performs heel toe raises x 5 reps, 3 second hold with BUE support at counter.  Pt performs partial tandem stance, with UE  support at counter, 2 reps each foot position x 10 seconds each.  Cues for upright posture, looking ahead at target on wall during standing exercises.  PT demo exercises with pictures of HEP.         PT Education - 08/15/19 0911    Education Details  Results of eval, POC; educated in use of RW for safety with gait, given Berg score of 33/56; initiated HEP    Person(s) Educated  Patient;Child(ren)   daughter, Benjamin Hahn   Methods  Explanation;Demonstration;Handout    Comprehension  Verbalized understanding;Returned demonstration       PT Short Term Goals - 08/15/19 1947      PT SHORT TERM GOAL #1   Title  Pt will be independent with HEP for improved strength, balance, and gait.  TARGET 09/15/2019    Time  5    Period  Weeks    Status  New    Target Date  09/15/19      PT SHORT TERM GOAL #2   Title  Pt will improve TUG score to less than or equal to 15 seconds for decreased fall risk.    Time  5    Period  Weeks  Status  New    Target Date  09/15/19      PT SHORT TERM GOAL #3   Title  Pt will improve Berg Balance score to at least 38/56 for decreased fall risk.    Time  5    Period  Weeks    Status  New    Target Date  09/15/19      PT SHORT TERM GOAL #4   Title  Pt will improve 5x sit<>stand to less than or equal to 12.5 seconds for decreased fall risk.    Time  5    Period  Weeks    Status  New    Target Date  09/15/19      PT SHORT TERM GOAL #5   Title  Pt will negotiate at least 12 steps, using one handrail with step through pattern, with supervision, for improved ability to return to [employer].    Time  5    Period  Weeks    Status  New    Target Date  09/15/19      Additional Short Term Goals   Additional Short Term Goals  Yes      PT SHORT TERM GOAL #6   Title  Pt will verbalize understanding of fall prevention in home environment.    Time  5    Period  Weeks    Status  New    Target Date  09/15/19        PT Long Term Goals -  08/15/19 1953      PT LONG TERM GOAL #1   Title  Pt will be independent with progression of HEP for improved balance, strength, and gait.  TARGET 10/13/2019    Time  9    Period  Weeks    Status  New    Target Date  10/13/19      PT LONG TERM GOAL #2   Title  Pt will improve TUG score to less than or equal to 13.5 seconds for decreased fall risk.    Time  9    Period  Weeks    Status  New    Target Date  10/13/19      PT LONG TERM GOAL #3   Title  Pt will improve Berg score to at least 45/56 for decreased fall risk.    Time  9    Period  Weeks    Status  New    Target Date  10/13/19      PT LONG TERM GOAL #4   Title  Pt will perform floor>stand transfer with UE support, with supervision, for improved fall recovery, floor>stand transfers.    Time  9    Period  Weeks    Status  New    Target Date  10/13/19      PT LONG TERM GOAL #5   Title  Pt will verbalize plans for continued community fitness upon d/c from PT.    Time  9    Period  Weeks    Status  New    Target Date  10/13/19             Plan - 08/15/19 0913    Clinical Impression Statement  Pt is an 85 year old male who presents to [facility] s/p fall resulting in subdural hematoma 04/21/2019.  He was hospitalized, with craniotomy to evacuate hematoma, then received HHPT.  He presents to OPPT today with goals of improving balance, strength,  and gait.  He is at fall risk per Merrilee Jansky and TUG scores; he demonstrates limited community ambulator status per gait velocity score.  He demonstrates decreased lower extremity strength, decreased balance, abnormality of gait, abnormal posture, hx of falls.  He would benefit from skilled PT to address the above stated deficits to decrease fall risk and improve functional mobility and independence.    Personal Factors and Comorbidities  Comorbidity 2;Behavior Pattern   hx of falls   Comorbidities  HTN, hyperlipidemia    Examination-Activity Limitations  Locomotion  Level;Transfers;Squat;Stairs;Stand    Examination-Participation Restrictions  Community Activity;Driving;Other   walking in neighborhood, exercise at fitness center   Stability/Clinical Decision Making  Evolving/Moderate complexity    Clinical Decision Making  Moderate    PT Frequency  2x / week    PT Duration  Other (comment)   9 weeks, including eval week   PT Treatment/Interventions  ADLs/Self Care Home Management;Gait training;Stair training;Functional mobility training;Therapeutic activities;Therapeutic exercise;Balance training;Neuromuscular re-education;Patient/family education    PT Next Visit Plan  Review HEP, continue lower extremity strengthening (add in UE strenghtening as well-to help with floor transfers); balance, gait training (with RW); walking program for home    Consulted and Agree with Plan of Care  Patient;Family member/caregiver   daughter      Patient will benefit from skilled therapeutic intervention in order to improve the following deficits and impairments:  Abnormal gait, Difficulty walking, Decreased activity tolerance, Decreased balance, Decreased mobility, Decreased strength, Postural dysfunction  Visit Diagnosis: Other abnormalities of gait and mobility  Muscle weakness (generalized)  Unsteadiness on feet     Problem List Patient Active Problem List   Diagnosis Date Noted  . Deficiency anemia 06/01/2019  . Vitamin D deficiency 06/01/2019  . Chronic idiopathic constipation 06/01/2019  . Traumatic subdural hematoma ([place]) 04/21/2019  . Hyperglycemia 04/15/2019  . Mild intermittent asthma with acute exacerbation 02/08/2018  . Hypothyroidism 03/14/2013  . Hyperlipidemia LDL goal <70 03/14/2013  . BPH (benign prostatic hypertrophy) 03/14/2013  . Hypogonadism, male 03/14/2013  . Erectile dysfunction 03/14/2013    Benjamin Hahn W. 08/15/2019, 7:58 PM  Frazier Butt., PT   [facility] [address] [place], [place], [address] Phone: [phone]   Fax:  [phone]  Name: DEWAYNE KOK MRN: [id number] Date of Birth: 1932/12/23

## 2019-08-17 ENCOUNTER — Encounter: Payer: Self-pay | Admitting: Physical Therapy

## 2019-08-17 ENCOUNTER — Ambulatory Visit: Payer: Medicare Other | Admitting: Physical Therapy

## 2019-08-17 ENCOUNTER — Other Ambulatory Visit: Payer: Self-pay

## 2019-08-17 DIAGNOSIS — M6281 Muscle weakness (generalized): Secondary | ICD-10-CM

## 2019-08-17 DIAGNOSIS — R2689 Other abnormalities of gait and mobility: Secondary | ICD-10-CM | POA: Diagnosis not present

## 2019-08-17 DIAGNOSIS — R2681 Unsteadiness on feet: Secondary | ICD-10-CM

## 2019-08-17 NOTE — Patient Instructions (Signed)
Access Code: [email or web address]  URL: [email or web address]  Date: 08/17/2019  Prepared by: Mady Haagensen   Exercises Standing Hip Abduction - 10 reps - 2 sets - 3 sec hold - 1x daily - 5x weekly Standing Hip Extension - 10 reps - 2 sets - 3 sec hold - 1x daily - 5x weekly Standing Forward Step Taps with Counter Support - 10 reps - 2 sets - 1x daily - 5x weekly Sit to Stand with Armchair - 5 reps - 2 sets - 1x daily - 5x weekly

## 2019-08-17 NOTE — Therapy (Signed)
[facility] [address] [place], [place], [address] Phone: [phone]   Fax:  [phone]  Physical Therapy Treatment  Patient Details  Name: Benjamin Hahn MRN: [id number] Date of Birth: Feb 12, 1933 Referring Provider (PT): Jovita Gamma   Encounter Date: 08/17/2019  PT End of Session - 08/17/19 0858    Visit Number  2    Number of Visits  18    Date for PT Re-Evaluation  11/13/19    Authorization Type  Medicare/BCBS-Will need 10th visit progress note    PT Start Time  0803    PT Stop Time  0845    PT Time Calculation (min)  42 min    Activity Tolerance  Patient tolerated treatment well    Behavior During Therapy  [facility] for tasks assessed/performed       Past Medical History:  Diagnosis Date  . Anxiety   . Arthritis    might be in back, no problems  . BPH (benign prostatic hyperplasia)   . Depression 1990s  . GERD (gastroesophageal reflux disease)    occasional  . Hypercholesteremia    controled  . Hypothyroid   . Shingles May 2014   "Right face, still has some"  . Temporal arteritis ([place])   . Transient blindness of both eyes     Past Surgical History:  Procedure Laterality Date  . ARTERY BIOPSY Right 06/20/2013   Procedure: BIOPSY TEMPORAL ARTERY RIGHT;  Surgeon: Earnstine Regal, MD;  Location: [facility];  Service: General;  Laterality: Right;  . CARDIAC CATHETERIZATION N/A 09/02/2015   Procedure: Left Heart Cath and Coronary Angiography;  Surgeon: Charolette Forward, MD;  Location: [place] [facility];  Service: Cardiovascular;  Laterality: N/A;  . CARDIAC CATHETERIZATION N/A 09/02/2015   Procedure: Coronary Stent Intervention;  Surgeon: Charolette Forward, MD;  Location: [place] [facility];  Service: Cardiovascular;  Laterality: N/A;  1.  mid RCA      (3.0/28mm Xience) 2.  Mid LAD      (3.0/23mm Xience)  . CRANIOTOMY N/A 04/19/2019   Procedure: CRANIOTOMY HEMATOMA EVACUATION SUBDURAL;  Surgeon: Jovita Gamma, MD;  Location: [facility];  Service: Neurosurgery;  Laterality: N/A;  CRANIOTOMY HEMATOMA EVACUATION SUBDURAL  . None      There were no vitals filed for this visit.  Subjective Assessment - 08/17/19 0804    Subjective  Feel like I'm walking better today.    Patient is accompained by:  Family member   daughter   Patient Stated Goals  Pt's goals are to improve walking, balance, strength.  Would like to get back to driving.    Currently in Pain?  No/denies                       [facility] Adult PT Treatment/Exercise - 08/17/19 0001      Transfers   Transfers  Sit to Stand;Stand to Sit    Sit to Stand  5: Supervision;With upper extremity assist;From bed    Sit to Stand Details  Verbal cues for sequencing;Verbal cues for technique    Sit to Stand Details (indicate cue type and reason)  Cues for foot placement, forward lean, upright stand, slow squat to sit.     Stand to Sit  5: Supervision;With upper extremity assist;To chair/3-in-1    Comments  Attempted from elevated mat surface, x 5 reps, then returned to lower mat surface, as with elevated mat surface, pt tends to have posterior lean/pushing legs  against mat.  Cues for light use of hands and "nose over toes" for improved ease of transfer.      Ambulation/Gait   Ambulation/Gait  Yes    Ambulation/Gait Assistance  4: Min guard    Ambulation Distance (Feet)  80 Feet   60 ft x 4 reps   Assistive device  None    Gait Pattern  Step-through pattern;Decreased step length - right;Decreased step length - left;Narrow base of support;Trunk flexed;Shuffle    Ambulation Surface  Level;Indoor    Gait Comments  Cues for heelstrike, increased step length with gait.  Pt did not bring walker into session today; short bouts of gait during session without AD due to time constaints in order to focus on exercises; min guard provided throughout.      Exercises   Exercises  Knee/Hip;Ankle      Knee/Hip Exercises: Aerobic   Nustep  NuStep,  Level 5, 4 extremities, x 8 minutes for lower extremity strength.  Cues to keep steps/minute > 60 throughout      Knee/Hip Exercises: Standing   Hip Abduction  Stengthening;Right;Left;1 set;10 reps   cues for technique, 3 sec hold    Hip Extension  Stengthening;1 set;10 reps   cues for technique, 3 sec hold   Forward Step Up  Right;Left;1 set;10 reps;Hand Hold: 2;Step Height: 6"    Other Standing Knee Exercises  Hip/knee flexion, alternating step taps to 6" step, 2 sets x 10 reps   Discussed how pt could also do this at cabinet shelf at sink     Knee/Hip Exercises: Seated   Sit to Sand  2 sets;5 reps;without UE support   from 20" mat surface, cues for forward lean, squat to sit      Cues throughout for upright posture, focus on visual target to assist with posture.   Balance Exercises - 08/17/19 0808      Balance Exercises: Standing   Partial Tandem Stance  Eyes open;2 reps;10 secs    Heel Raises  Both;10 reps   2 sets-cues for 3 sec hold   Toe Raise  Both;10 reps   2 sets-cues for 3 sec hold    (REview of HEP from last visit-cues for 3 second hold and for foot positioning/posture with partial tandem)   PT Education - 08/17/19 0857    Education Details  Additions to HEP-see instructions    Person(s) Educated  Patient;Child(ren)   daughter, Sharee Pimple   Methods  Explanation;Demonstration;Handout    Comprehension  Verbalized understanding;Returned demonstration       PT Short Term Goals - 08/15/19 1947      PT SHORT TERM GOAL #1   Title  Pt will be independent with HEP for improved strength, balance, and gait.  TARGET 09/15/2019    Time  5    Period  Weeks    Status  New    Target Date  09/15/19      PT SHORT TERM GOAL #2   Title  Pt will improve TUG score to less than or equal to 15 seconds for decreased fall risk.    Time  5    Period  Weeks    Status  New    Target Date  09/15/19      PT SHORT TERM GOAL #3   Title  Pt will improve Berg Balance score to at least  38/56 for decreased fall risk.    Time  5    Period  Weeks    Status  New    Target Date  09/15/19      PT SHORT TERM GOAL #4   Title  Pt will improve 5x sit<>stand to less than or equal to 12.5 seconds for decreased fall risk.    Time  5    Period  Weeks    Status  New    Target Date  09/15/19      PT SHORT TERM GOAL #5   Title  Pt will negotiate at least 12 steps, using one handrail with step through pattern, with supervision, for improved ability to return to [employer].    Time  5    Period  Weeks    Status  New    Target Date  09/15/19      Additional Short Term Goals   Additional Short Term Goals  Yes      PT SHORT TERM GOAL #6   Title  Pt will verbalize understanding of fall prevention in home environment.    Time  5    Period  Weeks    Status  New    Target Date  09/15/19        PT Long Term Goals - 08/15/19 1953      PT LONG TERM GOAL #1   Title  Pt will be independent with progression of HEP for improved balance, strength, and gait.  TARGET 10/13/2019    Time  9    Period  Weeks    Status  New    Target Date  10/13/19      PT LONG TERM GOAL #2   Title  Pt will improve TUG score to less than or equal to 13.5 seconds for decreased fall risk.    Time  9    Period  Weeks    Status  New    Target Date  10/13/19      PT LONG TERM GOAL #3   Title  Pt will improve Berg score to at least 45/56 for decreased fall risk.    Time  9    Period  Weeks    Status  New    Target Date  10/13/19      PT LONG TERM GOAL #4   Title  Pt will perform floor>stand transfer with UE support, with supervision, for improved fall recovery, floor>stand transfers.    Time  9    Period  Weeks    Status  New    Target Date  10/13/19      PT LONG TERM GOAL #5   Title  Pt will verbalize plans for continued community fitness upon d/c from PT.    Time  9    Period  Weeks    Status  New    Target Date  10/13/19            Plan - 08/17/19 0858    Clinical  Impression Statement  Reviewed initial HEP and added additional exercises for hip and lower extremity strengthening.  Cues provided throughout session for correct technique and for posture, as pt tends to have forward head posture/looking down at feet.  Pt very motivated for therapy and appreciative for updates to HEP.  He will continue to benefit from skilled PT to address strength, balance, and gait for improved functional mobility, decreased fall risk.    Personal Factors and Comorbidities  Comorbidity 2;Behavior Pattern   hx of falls   Comorbidities  HTN, hyperlipidemia    Examination-Activity Limitations  Locomotion  Level;Transfers;Squat;Stairs;Stand    Examination-Participation Restrictions  Community Activity;Driving;Other   walking in neighborhood, exercise at fitness center   Stability/Clinical Decision Making  Evolving/Moderate complexity    PT Frequency  2x / week    PT Duration  Other (comment)   9 weeks, including eval week   PT Treatment/Interventions  ADLs/Self Care Home Management;Gait training;Stair training;Functional mobility training;Therapeutic activities;Therapeutic exercise;Balance training;Neuromuscular re-education;Patient/family education    PT Next Visit Plan  Review updates to HEP, continue lower extremity strengthening (add in UE strenghtening as well-to help with floor transfers); balance, encourage gait training with RW to encourage increased step length and upright posture; walking program for home    Consulted and Agree with Plan of Care  Patient;Family member/caregiver   daughter   Family Member Consulted  daughter, Sharee Pimple       Patient will benefit from skilled therapeutic intervention in order to improve the following deficits and impairments:  Abnormal gait, Difficulty walking, Decreased activity tolerance, Decreased balance, Decreased mobility, Decreased strength, Postural dysfunction  Visit Diagnosis: Muscle weakness (generalized)  Unsteadiness on  feet  Other abnormalities of gait and mobility     Problem List Patient Active Problem List   Diagnosis Date Noted  . Deficiency anemia 06/01/2019  . Vitamin D deficiency 06/01/2019  . Chronic idiopathic constipation 06/01/2019  . Traumatic subdural hematoma ([place]) 04/21/2019  . Hyperglycemia 04/15/2019  . Mild intermittent asthma with acute exacerbation 02/08/2018  . Hypothyroidism 03/14/2013  . Hyperlipidemia LDL goal <70 03/14/2013  . BPH (benign prostatic hypertrophy) 03/14/2013  . Hypogonadism, male 03/14/2013  . Erectile dysfunction 03/14/2013    Maayan Jenning W. 08/17/2019, 9:04 AM  Mady Haagensen, PT 08/17/19 9:05 AM Phone: [phone] Fax: [facility] [address] [place], [place], [address] Phone: [phone]   Fax:  [phone]  Name: Benjamin Hahn MRN: [id number] Date of Birth: 1932-12-03

## 2019-08-28 ENCOUNTER — Encounter: Payer: Self-pay | Admitting: Physical Therapy

## 2019-08-28 ENCOUNTER — Other Ambulatory Visit: Payer: Self-pay

## 2019-08-28 ENCOUNTER — Ambulatory Visit: Payer: Medicare Other | Attending: Physician Assistant | Admitting: Physical Therapy

## 2019-08-28 DIAGNOSIS — M6281 Muscle weakness (generalized): Secondary | ICD-10-CM | POA: Diagnosis not present

## 2019-08-28 DIAGNOSIS — R2689 Other abnormalities of gait and mobility: Secondary | ICD-10-CM | POA: Insufficient documentation

## 2019-08-28 DIAGNOSIS — R2681 Unsteadiness on feet: Secondary | ICD-10-CM | POA: Diagnosis not present

## 2019-08-28 NOTE — Therapy (Signed)
[facility] [address] [place], [place], [address] Phone: [phone]   Fax:  [phone]  Physical Therapy Treatment  Patient Details  Name: Benjamin Hahn MRN: [id number] Date of Birth: Jul 02, 1933 Referring Provider (PT): Jovita Gamma   Encounter Date: 08/28/2019  PT End of Session - 08/28/19 2039    Visit Number  3    Number of Visits  18    Date for PT Re-Evaluation  11/13/19    Authorization Type  Medicare/BCBS-Will need 10th visit progress note    PT Start Time  0848    PT Stop Time  0932    PT Time Calculation (min)  44 min    Activity Tolerance  Patient tolerated treatment well    Behavior During Therapy  [facility] for tasks assessed/performed       Past Medical History:  Diagnosis Date  . Anxiety   . Arthritis    might be in back, no problems  . BPH (benign prostatic hyperplasia)   . Depression 1990s  . GERD (gastroesophageal reflux disease)    occasional  . Hypercholesteremia    controled  . Hypothyroid   . Shingles May 2014   "Right face, still has some"  . Temporal arteritis ([place])   . Transient blindness of both eyes     Past Surgical History:  Procedure Laterality Date  . ARTERY BIOPSY Right 06/20/2013   Procedure: BIOPSY TEMPORAL ARTERY RIGHT;  Surgeon: Earnstine Regal, MD;  Location: [facility];  Service: General;  Laterality: Right;  . CARDIAC CATHETERIZATION N/A 09/02/2015   Procedure: Left Heart Cath and Coronary Angiography;  Surgeon: Charolette Forward, MD;  Location: [place] [facility];  Service: Cardiovascular;  Laterality: N/A;  . CARDIAC CATHETERIZATION N/A 09/02/2015   Procedure: Coronary Stent Intervention;  Surgeon: Charolette Forward, MD;  Location: [place] [facility];  Service: Cardiovascular;  Laterality: N/A;  1.  mid RCA      (3.0/28mm Xience) 2.  Mid LAD      (3.0/23mm Xience)  . CRANIOTOMY N/A 04/19/2019   Procedure: CRANIOTOMY HEMATOMA EVACUATION SUBDURAL;  Surgeon: Jovita Gamma,  MD;  Location: [facility];  Service: Neurosurgery;  Laterality: N/A;  CRANIOTOMY HEMATOMA EVACUATION SUBDURAL  . None      There were no vitals filed for this visit.  Subjective Assessment - 08/28/19 0852    Subjective  Denies any falls or changes since last visit.  Has been working on his exercises.    Patient is accompained by:  Family member   daughter   Patient Stated Goals  Pt's goals are to improve walking, balance, strength.  Would like to get back to driving.    Currently in Pain?  No/denies        [facility] Adult PT Treatment/Exercise - 08/28/19 0001      Transfers   Transfers  Sit to Stand;Stand to Sit    Sit to Stand  5: Supervision;With upper extremity assist;Without upper extremity assist;From chair/3-in-1    Sit to Stand Details  Verbal cues for technique    Stand to Sit  5: Supervision;With upper extremity assist;To chair/3-in-1;Without upper extremity assist    Number of Reps  Other reps (comment)   5 reps x 3 sets   Transfer Cueing  cues for forward lean (nose over toes) and to control descent along with equal weight bearing      Ambulation/Gait   Ambulation/Gait  Yes    Ambulation/Gait Assistance  5: Supervision;4: Min guard  Ambulation/Gait Assistance Details  guard assist x 4 during gait due to LOB to the right    Ambulation Distance (Feet)  230 Feet   x 1 and 115' x 2   Assistive device  Rolling walker;None    Gait Pattern  Step-through pattern;Decreased step length - right;Decreased step length - left;Narrow base of support;Trunk flexed;Shuffle    Ambulation Surface  Level;Indoor    Gait Comments  Cues for heelstrike, increased step length with gait.  Pt did not bring walker into session today and states he only uses it in his office and at home.  Reports that he does not go anywhere other than PT and his work..  Pt's gait improves with cues but quickly reverts back to pattern.      Posture/Postural Control   Posture/Postural Control  Postural limitations     Postural Limitations  Rounded Shoulders;Forward head;Posterior pelvic tilt    Posture Comments  In standing, tends to look down at floor      Exercises   Exercises  Knee/Hip;Shoulder;Elbow      Elbow Exercises   Elbow Flexion  Strengthening;Both;10 reps;Seated;Theraband    Theraband Level (Elbow Flexion)  Level 1 (Yellow)      Knee/Hip Exercises: Standing   Hip Abduction  Stengthening;Right;Left;10 reps;Knee straight    Hip Extension  Stengthening;Both;10 reps;1 set;Knee straight;Limitations    Extension Limitations  tends to bend knee    Forward Step Up  Right;Left;Step Height: 6";Hand Hold: 2;5 reps;2 sets    Other Standing Knee Exercises  Hip/knee flexion, alternating step taps to 6" step, 2 sets x 10 reps    Other Standing Knee Exercises  heel raises x 20 and toe raises x 20-at counter for support      Shoulder Exercises: Seated   Row  Both;10 reps;Theraband    Theraband Level (Shoulder Row)  Level 1 (Yellow)    Protraction  Both;10 reps;Theraband    Theraband Level (Shoulder Protraction)  Level 1 (Yellow)    Horizontal ABduction  Other (comment);Limitations    Horizontal ABduction Limitations  Pt unable to perform due to shoulder decreased ROM             PT Education - 08/28/19 2038    Education Details  additions to HEP, heel strike with gait, using RW for safety with gait    Person(s) Educated  Patient    Methods  Explanation;Demonstration;Handout;Verbal cues    Comprehension  Verbalized understanding;Returned demonstration   needs continued reinforcement      PT Short Term Goals - 08/15/19 1947      PT SHORT TERM GOAL #1   Title  Pt will be independent with HEP for improved strength, balance, and gait.  TARGET 09/15/2019    Time  5    Period  Weeks    Status  New    Target Date  09/15/19      PT SHORT TERM GOAL #2   Title  Pt will improve TUG score to less than or equal to 15 seconds for decreased fall risk.    Time  5    Period  Weeks    Status  New     Target Date  09/15/19      PT SHORT TERM GOAL #3   Title  Pt will improve Berg Balance score to at least 38/56 for decreased fall risk.    Time  5    Period  Weeks    Status  New    Target Date  09/15/19      PT SHORT TERM GOAL #4   Title  Pt will improve 5x sit<>stand to less than or equal to 12.5 seconds for decreased fall risk.    Time  5    Period  Weeks    Status  New    Target Date  09/15/19      PT SHORT TERM GOAL #5   Title  Pt will negotiate at least 12 steps, using one handrail with step through pattern, with supervision, for improved ability to return to [employer].    Time  5    Period  Weeks    Status  New    Target Date  09/15/19      Additional Short Term Goals   Additional Short Term Goals  Yes      PT SHORT TERM GOAL #6   Title  Pt will verbalize understanding of fall prevention in home environment.    Time  5    Period  Weeks    Status  New    Target Date  09/15/19        PT Long Term Goals - 08/15/19 1953      PT LONG TERM GOAL #1   Title  Pt will be independent with progression of HEP for improved balance, strength, and gait.  TARGET 10/13/2019    Time  9    Period  Weeks    Status  New    Target Date  10/13/19      PT LONG TERM GOAL #2   Title  Pt will improve TUG score to less than or equal to 13.5 seconds for decreased fall risk.    Time  9    Period  Weeks    Status  New    Target Date  10/13/19      PT LONG TERM GOAL #3   Title  Pt will improve Berg score to at least 45/56 for decreased fall risk.    Time  9    Period  Weeks    Status  New    Target Date  10/13/19      PT LONG TERM GOAL #4   Title  Pt will perform floor>stand transfer with UE support, with supervision, for improved fall recovery, floor>stand transfers.    Time  9    Period  Weeks    Status  New    Target Date  10/13/19      PT LONG TERM GOAL #5   Title  Pt will verbalize plans for continued community fitness upon d/c from PT.    Time  9     Period  Weeks    Status  New    Target Date  10/13/19            Plan - 08/28/19 2040    Clinical Impression Statement  Skilled session focused on reviewing HEP and additions to HEP for UE's.  Pt with decreased active shoulder flexion and abduction bil limiting exercises provided and needs cues to perform correctly. Continues to be motivated to increased mobility. Continue PT per POC.    Personal Factors and Comorbidities  Comorbidity 2;Behavior Pattern   hx of falls   Comorbidities  HTN, hyperlipidemia    Examination-Activity Limitations  Locomotion Level;Transfers;Squat;Stairs;Stand;Other    Examination-Participation Restrictions  Community Activity;Driving;Other   walking in neighborhood, exercise at fitness center   Stability/Clinical Decision Making  Evolving/Moderate complexity    PT Frequency  2x /  week    PT Duration  Other (comment)   9 weeks, including eval week   PT Treatment/Interventions  ADLs/Self Care Home Management;Gait training;Stair training;Functional mobility training;Therapeutic activities;Therapeutic exercise;Balance training;Neuromuscular re-education;Patient/family education    PT Next Visit Plan  Continue lower extremity strengthening and add UE strenghtening as able due to decreased AROM balance, encourage gait training with RW to encourage increased step length and upright posture; walking program for home    Consulted and Agree with Plan of Care  Patient   daughter      Patient will benefit from skilled therapeutic intervention in order to improve the following deficits and impairments:  Abnormal gait, Difficulty walking, Decreased activity tolerance, Decreased balance, Decreased mobility, Decreased strength, Postural dysfunction  Visit Diagnosis: Muscle weakness (generalized)  Unsteadiness on feet  Other abnormalities of gait and mobility     Problem List Patient Active Problem List   Diagnosis Date Noted  . Deficiency anemia 06/01/2019   . Vitamin D deficiency 06/01/2019  . Chronic idiopathic constipation 06/01/2019  . Traumatic subdural hematoma ([place]) 04/21/2019  . Hyperglycemia 04/15/2019  . Mild intermittent asthma with acute exacerbation 02/08/2018  . Hypothyroidism 03/14/2013  . Hyperlipidemia LDL goal <70 03/14/2013  . BPH (benign prostatic hypertrophy) 03/14/2013  . Hypogonadism, male 03/14/2013  . Erectile dysfunction 03/14/2013    Narda Bonds, PTA [facility] 08/28/19 8:45 PM Phone: [phone] Fax: [facility] [address] [place], [place], [address] Phone: [phone]   Fax:  [phone]  Name: Benjamin Hahn MRN: [id number] Date of Birth: 1933/08/03

## 2019-08-28 NOTE — Patient Instructions (Signed)
Shoulder Retraction With Band    With band attached in front, pull arms back as if rowing a boat. Hold 3 seconds. Repeat 10 times. Do 2 sessions per day.  Copyright  VHI. All rights reserved.   Flexion (Resistive Band)    With band looped around hand and wrist, use elbow movements only. Using other arm as anchor, bend elbow, pulling up. Hold ____ seconds. Repeat 10 times. Do 2 sessions per day.  Copyright  VHI. All rights reserved.

## 2019-09-07 ENCOUNTER — Other Ambulatory Visit: Payer: Self-pay

## 2019-09-07 ENCOUNTER — Ambulatory Visit: Payer: Medicare Other | Admitting: Physical Therapy

## 2019-09-07 DIAGNOSIS — R2681 Unsteadiness on feet: Secondary | ICD-10-CM | POA: Diagnosis not present

## 2019-09-07 DIAGNOSIS — M6281 Muscle weakness (generalized): Secondary | ICD-10-CM

## 2019-09-07 DIAGNOSIS — R2689 Other abnormalities of gait and mobility: Secondary | ICD-10-CM | POA: Diagnosis not present

## 2019-09-07 NOTE — Therapy (Signed)
[place] [address] [place], [place], [address] Phone: [phone]   Fax:  [phone]  Physical Therapy Treatment  Patient Details  Name: Benjamin Hahn MRN: [id number] Date of Birth: November 15, 1932 Referring Provider (PT): Jovita Gamma   Encounter Date: 09/07/2019  PT End of Session - 09/07/19 1000    Visit Number  4    Number of Visits  18    Date for PT Re-Evaluation  11/13/19    Authorization Type  Medicare/BCBS-Will need 10th visit progress note    PT Start Time  0850    PT Stop Time  0930    PT Time Calculation (min)  40 min    Activity Tolerance  Patient tolerated treatment well   Several brief seated rest breaks due to fatigue   Behavior During Therapy  [facility] for tasks assessed/performed       Past Medical History:  Diagnosis Date  . Anxiety   . Arthritis    might be in back, no problems  . BPH (benign prostatic hyperplasia)   . Depression 1990s  . GERD (gastroesophageal reflux disease)    occasional  . Hypercholesteremia    controled  . Hypothyroid   . Shingles May 2014   "Right face, still has some"  . Temporal arteritis ([place])   . Transient blindness of both eyes     Past Surgical History:  Procedure Laterality Date  . ARTERY BIOPSY Right 06/20/2013   Procedure: BIOPSY TEMPORAL ARTERY RIGHT;  Surgeon: Earnstine Regal, MD;  Location: [facility];  Service: General;  Laterality: Right;  . CARDIAC CATHETERIZATION N/A 09/02/2015   Procedure: Left Heart Cath and Coronary Angiography;  Surgeon: Charolette Forward, MD;  Location: [place] [facility];  Service: Cardiovascular;  Laterality: N/A;  . CARDIAC CATHETERIZATION N/A 09/02/2015   Procedure: Coronary Stent Intervention;  Surgeon: Charolette Forward, MD;  Location: [place] [facility];  Service: Cardiovascular;  Laterality: N/A;  1.  mid RCA      (3.0/28mm Xience) 2.  Mid LAD      (3.0/23mm Xience)  . CRANIOTOMY N/A 04/19/2019   Procedure: CRANIOTOMY  HEMATOMA EVACUATION SUBDURAL;  Surgeon: Jovita Gamma, MD;  Location: [place];  Service: Neurosurgery;  Laterality: N/A;  CRANIOTOMY HEMATOMA EVACUATION SUBDURAL  . None      There were no vitals filed for this visit.  Subjective Assessment - 09/07/19 0948    Subjective  No pain, no changes.  Doing exercises maybe 2 times per week.  Wife dropped me off at the door, so I didn't bring in my walker.  Using walker to walk to my mailbox and back.    Patient Stated Goals  Pt's goals are to improve walking, balance, strength.  Would like to get back to driving.    Currently in Pain?  No/denies                       [facility] Adult PT Treatment/Exercise - 09/07/19 0001      Transfers   Transfers  Sit to Stand;Stand to Sit    Sit to Stand  5: Supervision;Without upper extremity assist;From chair/3-in-1    Sit to Stand Details  Verbal cues for technique    Stand to Sit  5: Supervision;To chair/3-in-1;Without upper extremity assist    Number of Reps  10 reps   5 additional reps, with UE support   Transfer Cueing  cues for forward lean, one episode of retropulsion to sit  back in chair.    Comments  Additional 5 reps throughout session, with brief seated rest breaks.      Ambulation/Gait   Ambulation/Gait  Yes    Ambulation/Gait Assistance  5: Supervision;4: Min guard    Ambulation/Gait Assistance Details  Cues for use of mirror along gym track for improved posture, VCs for increased step length, heelstrike    Ambulation Distance (Feet)  230 Feet   x 2 with RW; 80 ft x 2, no device   Assistive device  Rolling walker;None   Pt comes into therapy with no device   Gait Pattern  Step-through pattern;Decreased step length - right;Decreased step length - left;Narrow base of support;Trunk flexed;Shuffle    Ambulation Surface  Level;Indoor    Stairs  Yes    Stairs Assistance  4: Min guard    Stair Management Technique  One rail Right;Alternating pattern;Two rails;Forwards   Started with  2 rails, then to 1 rail, simulating gym entry   Number of Stairs  4   x 3   Height of Stairs  6    Gait Comments  Discussed walking program for home, using RW, starting at 2-3 minutes, 3x/day          Balance Exercises - 09/07/19 0908      Balance Exercises: Standing   Standing Eyes Opened  Wide ([place]);Foam/compliant surface;5 reps;Head turns   Head nods; posterior lean, PT provides min guard assist   Step Ups  Forward;6 inch;UE support 1   x 10 reps each side   Marching  Solid surface;Upper extremity assist 2;10 reps   2 sets; additional set of 10 reps on foam BUE support   Heel Raises  Both;10 reps   2 sets   Toe Raise  Both;10 reps   2 sets   Other Standing Exercises  Alternating forward step taps x 10 reps to 6" step, to 12" step with BUE support.  Lateral step taps x 10 reps each leg, 1 UE support.  Forward lunge steps on solid surface, x 5 reps-pt needs extensive cues to slow pace and strike with heel first iwth stepping; standing on foam, forward step taps, alternating legs, x 10 reps, with cues for full tap to floor and to slow pace.        PT Education - 09/07/19 0959    Education Details  Walking program added to [employer]) Educated  Patient    Methods  Explanation;Demonstration;Handout    Comprehension  Verbalized understanding;Returned demonstration;Need further instruction       PT Short Term Goals - 09/07/19 1026      PT SHORT TERM GOAL #1   Title  Pt will be independent with HEP for improved strength, balance, and gait.  TARGET 09/22/2019 (date extended x 1 week, due to missing 1 week of therapy-scheduling concerns)    Time  5    Period  Weeks    Status  New    Target Date  09/22/19      PT SHORT TERM GOAL #2   Title  Pt will improve TUG score to less than or equal to 15 seconds for decreased fall risk.    Time  5    Period  Weeks    Status  New    Target Date  09/22/19      PT SHORT TERM GOAL #3   Title  Pt will improve Berg Balance score to  at least 38/56 for decreased fall risk.  Time  5    Period  Weeks    Status  New    Target Date  09/22/19      PT SHORT TERM GOAL #4   Title  Pt will improve 5x sit<>stand to less than or equal to 12.5 seconds for decreased fall risk.    Time  5    Period  Weeks    Status  New    Target Date  09/22/19      PT SHORT TERM GOAL #5   Title  Pt will negotiate at least 12 steps, using one handrail with step through pattern, with supervision, for improved ability to return to [employer].    Time  5    Period  Weeks    Status  New    Target Date  09/22/19      PT SHORT TERM GOAL #6   Title  Pt will verbalize understanding of fall prevention in home environment.    Time  5    Period  Weeks    Status  New    Target Date  09/22/19        PT Long Term Goals - 09/07/19 1030      PT LONG TERM GOAL #1   Title  Pt will be independent with progression of HEP for improved balance, strength, and gait.  TARGET extended x 1 week, due to missing 1 wk of appt 10/20/2019    Time  9    Period  Weeks    Status  New      PT LONG TERM GOAL #2   Title  Pt will improve TUG score to less than or equal to 13.5 seconds for decreased fall risk.    Time  9    Period  Weeks    Status  New      PT LONG TERM GOAL #3   Title  Pt will improve Berg score to at least 45/56 for decreased fall risk.    Time  9    Period  Weeks    Status  New      PT LONG TERM GOAL #4   Title  Pt will perform floor>stand transfer with UE support, with supervision, for improved fall recovery, floor>stand transfers.    Time  9    Period  Weeks    Status  New      PT LONG TERM GOAL #5   Title  Pt will verbalize plans for continued community fitness upon d/c from PT.    Time  9    Period  Weeks    Status  New            Plan - 09/07/19 1023    Clinical Impression Statement  Skilled PT session today focused on gait training with RW (encouraging use of RW for walking program at home) as well as  standing balance/strengthening exercises.  He has been able to negotiate steps up to fitness center at [employer] with his wife's assistance, to use machines, x 2 in the past week.  He continues to need min guard assistance with gait and stairs due to posture and posterior lean at times.    Personal Factors and Comorbidities  Comorbidity 2;Behavior Pattern   hx of falls   Comorbidities  HTN, hyperlipidemia    Examination-Activity Limitations  Locomotion Level;Transfers;Squat;Stairs;Stand;Other    Examination-Participation Restrictions  Community Activity;Driving;Other   walking in neighborhood, exercise at fitness center   Stability/Clinical Decision Making  Evolving/Moderate complexity    PT Frequency  2x / week    PT Duration  Other (comment)   9 weeks, including eval week   PT Treatment/Interventions  ADLs/Self Care Home Management;Gait training;Stair training;Functional mobility training;Therapeutic activities;Therapeutic exercise;Balance training;Neuromuscular re-education;Patient/family education    PT Next Visit Plan  Review walking program and HEP; continue with strength, balance, postural exercises and gait training with RW; pushed STG date out 1 week, due to missing week of therapy    Consulted and Agree with Plan of Care  Patient       Patient will benefit from skilled therapeutic intervention in order to improve the following deficits and impairments:  Abnormal gait, Difficulty walking, Decreased activity tolerance, Decreased balance, Decreased mobility, Decreased strength, Postural dysfunction  Visit Diagnosis: Unsteadiness on feet  Other abnormalities of gait and mobility  Muscle weakness (generalized)     Problem List Patient Active Problem List   Diagnosis Date Noted  . Deficiency anemia 06/01/2019  . Vitamin D deficiency 06/01/2019  . Chronic idiopathic constipation 06/01/2019  . Traumatic subdural hematoma ([place]) 04/21/2019  . Hyperglycemia 04/15/2019  . Mild  intermittent asthma with acute exacerbation 02/08/2018  . Hypothyroidism 03/14/2013  . Hyperlipidemia LDL goal <70 03/14/2013  . BPH (benign prostatic hypertrophy) 03/14/2013  . Hypogonadism, male 03/14/2013  . Erectile dysfunction 03/14/2013    Frazier Butt. 09/07/2019, 10:40 AM  Frazier Butt., PT   [facility] [address] [place], [place], [address] Phone: [phone]   Fax:  [phone]  Name: OLUWAJOMILOJU DELLAPORTA MRN: [id number] Date of Birth: [birth year]

## 2019-09-07 NOTE — Patient Instructions (Signed)
WALKING  Walking is a great form of exercise to increase your strength, endurance and overall fitness.  A walking program can help you start slowly and gradually build endurance as you go.  Everyone's ability is different, so each person's starting point will be different.  You do not have to follow them exactly.  The are just samples. You should simply find out what's right for you and stick to that program.   In the beginning, you'll start off walking 2-3 times a day for short distances.  As you get stronger, you'll be walking further at just 1-2 times per day.  A. You Can Walk For A Certain Length Of Time Each Day    Walk 2-3 minutes 3 times per day.  Increase 1-2 minutes every 5 days (3 times per day).  Work up to 10-15 minutes (1-2 times per day).   Example:   Day 1-2 2-3 minutes 3 times per day   Day 7-8 4-5 minutes 2-3 times per day   Day 13-14 8-10 minutes 1-2 times per day  B. You Can Walk For a Certain Distance Each Day     Distance can be substituted for time.    Example:   3 laps around home   3 laps of your hallway at work   3 trips to [place]   Make sure to use your walker, Have GOOD POSTURE, and TAKE LONG, EVEN Strides

## 2019-09-08 ENCOUNTER — Ambulatory Visit (INDEPENDENT_AMBULATORY_CARE_PROVIDER_SITE_OTHER): Payer: Medicare Other | Admitting: Podiatry

## 2019-09-08 ENCOUNTER — Ambulatory Visit: Payer: Medicare Other | Attending: Internal Medicine

## 2019-09-08 DIAGNOSIS — Z23 Encounter for immunization: Secondary | ICD-10-CM | POA: Diagnosis not present

## 2019-09-08 DIAGNOSIS — L600 Ingrowing nail: Secondary | ICD-10-CM

## 2019-09-08 NOTE — Progress Notes (Signed)
Subjective:   Patient ID: Benjamin Hahn, male   DOB: 86 y.o.   MRN: [id number]   HPI Patient presents stating he is got an ingrown toenail and its been irritated with no active drainage or redness noted currently but it is painful   ROS      Objective:  Physical Exam  Neurovascular status intact with patient found to have an incurvated left hallux medial border with pain with no active drainage or redness but is quite sore when I palpated     Assessment:  Ingrown toenail deformity left hallux medial border     Plan:  H&P reviewed condition and recommended correction of deformity.  Patient wants procedure understands risk signed consent form and today I infiltrated the left hallux 60 mg like Marcaine mixture sterile prep applied and using sterile instrumentation remove the medial border exposed matrix and applied chemical 3 applications 30 seconds followed by alcohol lavage sterile dressing.  Give instructions on soaks and reappoint

## 2019-09-08 NOTE — Patient Instructions (Signed)

## 2019-09-08 NOTE — Progress Notes (Signed)
   Covid-19 Vaccination Clinic  Name:  Benjamin Hahn    MRN: [id number] DOB: 1933-03-24  09/08/2019  [name] was observed post Covid-19 immunization for 15 minutes without incidence. He was provided with Vaccine Information Sheet and instruction to access the V-Safe system.   [name] was instructed to call 911 with any severe reactions post vaccine: [name] Difficulty breathing  . Swelling of your face and throat  . A fast heartbeat  . A bad rash all over your body  . Dizziness and weakness    Immunizations Administered    Name Date Dose VIS Date Route   Pfizer COVID-19 Vaccine 09/08/2019  9:03 AM 0.3 mL 08/04/2019 Intramuscular   Manufacturer: [place]   Lot: [id number]   [place]: [id number]

## 2019-09-11 ENCOUNTER — Other Ambulatory Visit: Payer: Self-pay

## 2019-09-11 ENCOUNTER — Encounter: Payer: Self-pay | Admitting: Physical Therapy

## 2019-09-11 ENCOUNTER — Ambulatory Visit: Payer: Medicare Other | Admitting: Physical Therapy

## 2019-09-11 DIAGNOSIS — R2681 Unsteadiness on feet: Secondary | ICD-10-CM | POA: Diagnosis not present

## 2019-09-11 DIAGNOSIS — R2689 Other abnormalities of gait and mobility: Secondary | ICD-10-CM | POA: Diagnosis not present

## 2019-09-11 DIAGNOSIS — M6281 Muscle weakness (generalized): Secondary | ICD-10-CM | POA: Diagnosis not present

## 2019-09-11 NOTE — Therapy (Signed)
[place] [address] [place], [place], [address] Phone: [phone]   Fax:  [phone]  Physical Therapy Treatment  Patient Details  Name: Benjamin Hahn MRN: [id number] Date of Birth: [birth year] Referring Provider (PT): Jovita Gamma   Encounter Date: 09/11/2019  PT End of Session - 09/11/19 1003    Visit Number  5    Number of Visits  18    Date for PT Re-Evaluation  11/13/19    Authorization Type  Medicare/BCBS-Will need 10th visit progress note    PT Start Time  0848    PT Stop Time  0934    PT Time Calculation (min)  46 min    Activity Tolerance  Patient tolerated treatment well   Several brief seated rest breaks due to fatigue   Behavior During Therapy  [facility] for tasks assessed/performed       Past Medical History:  Diagnosis Date  . Anxiety   . Arthritis    might be in back, no problems  . BPH (benign prostatic hyperplasia)   . Depression 1990s  . GERD (gastroesophageal reflux disease)    occasional  . Hypercholesteremia    controled  . Hypothyroid   . Shingles May 2014   "Right face, still has some"  . Temporal arteritis ([place])   . Transient blindness of both eyes     Past Surgical History:  Procedure Laterality Date  . ARTERY BIOPSY Right 06/20/2013   Procedure: BIOPSY TEMPORAL ARTERY RIGHT;  Surgeon: Earnstine Regal, MD;  [facility];  Service: General;  Laterality: Right;  . CARDIAC CATHETERIZATION N/A 09/02/2015   Procedure: Left Heart Cath and Coronary Angiography;  Surgeon: Charolette Forward, MD;  Location: [place] [facility];  Service: Cardiovascular;  Laterality: N/A;  . CARDIAC CATHETERIZATION N/A 09/02/2015   Procedure: Coronary Stent Intervention;  Surgeon: Charolette Forward, MD;  Location: [place] [facility];  Service: Cardiovascular;  Laterality: N/A;  1.  mid RCA      (3.0/28mm Xience) 2.  Mid LAD      (3.0/23mm Xience)  . CRANIOTOMY N/A 04/19/2019   Procedure: CRANIOTOMY  HEMATOMA EVACUATION SUBDURAL;  Surgeon: Jovita Gamma, MD;  Location: [facility];  Service: Neurosurgery;  Laterality: N/A;  CRANIOTOMY HEMATOMA EVACUATION SUBDURAL  . None      There were no vitals filed for this visit.  Subjective Assessment - 09/11/19 0851    Subjective  Denies any falls or changes.  Did 30 minutes on the seated eliptical yesterday at the gym.    Patient Stated Goals  Pt's goals are to improve walking, balance, strength.  Would like to get back to driving.    Currently in Pain?  No/denies           [facility] Adult PT Treatment/Exercise - 09/11/19 0001      Bed Mobility   Bed Mobility  Supine to Sit;Sit to Supine    Supine to Sit  Independent    Sit to Supine  Independent      Transfers   Transfers  Sit to Stand;Stand to Sit    Sit to Stand  5: Supervision;Without upper extremity assist;From chair/3-in-1    Sit to Stand Details  Verbal cues for technique    Stand to Sit  5: Supervision;To chair/3-in-1;Without upper extremity assist    Number of Reps  10 reps    Transfer Cueing  cues for forward lean and not push against mat      Ambulation/Gait  Ambulation/Gait  Yes    Ambulation/Gait Assistance  5: Supervision    Ambulation/Gait Assistance Details  cues for heel strike, upright posture and arm swing.    Ambulation Distance (Feet)  440 Feet   x 1 and 115' x 2   Assistive device  None    Gait Pattern  Step-through pattern;Decreased step length - right;Decreased step length - left;Narrow base of support;Trunk flexed;Shuffle    Ambulation Surface  Level;Indoor    Gait Comments  Pt with forward head with gait and decreased heel strike along with bil knee flexion.  Posture did improve with cues and knee extension increased after stretches.        Posture/Postural Control   Posture/Postural Control  Postural limitations    Postural Limitations  Rounded Shoulders;Forward head;Posterior pelvic tilt    Posture Comments  Performed standing at wall for posture x 1  minute x 2 sets.  Pt unable to get head against wall.  Provided as HEP for home.  Discussed technique and avoiding head tilt.      Knee/Hip Exercises: Stretches   Active Hamstring Stretch  Both;3 reps;60 seconds;Limitations    Active Hamstring Stretch Limitations  tends to have posterior tilt and let knee flex    Other Knee/Hip Stretches  Had pt perform seated hamstring stretch with foot on 6" stool with black theraband in additon to supine with band.  Also performed seated in chair with foot resting in chair.  Pt needs cues to perform correctly and tends to have posterior pelvic tilt and not keep knee straight.  Provided as HEP and discussed importance of technique and frequent stretching.             PT Education - 09/11/19 1002    Education Details  posture, additions to HEP, tight hamstrings impact on gait, gait deviations    Person(s) Educated  Patient    Methods  Explanation;Demonstration;Handout    Comprehension  Verbalized understanding;Returned demonstration;Need further instruction       PT Short Term Goals - 09/07/19 1026      PT SHORT TERM GOAL #1   Title  Pt will be independent with HEP for improved strength, balance, and gait.  TARGET 09/22/2019 (date extended x 1 week, due to missing 1 week of therapy-scheduling concerns)    Time  5    Period  Weeks    Status  New    Target Date  09/22/19      PT SHORT TERM GOAL #2   Title  Pt will improve TUG score to less than or equal to 15 seconds for decreased fall risk.    Time  5    Period  Weeks    Status  New    Target Date  09/22/19      PT SHORT TERM GOAL #3   Title  Pt will improve Berg Balance score to at least 38/56 for decreased fall risk.    Time  5    Period  Weeks    Status  New    Target Date  09/22/19      PT SHORT TERM GOAL #4   Title  Pt will improve 5x sit<>stand to less than or equal to 12.5 seconds for decreased fall risk.    Time  5    Period  Weeks    Status  New    Target Date  09/22/19       PT SHORT TERM GOAL #5   Title  Pt will negotiate  at least 12 steps, using one handrail with step through pattern, with supervision, for improved ability to return to [employer].    Time  5    Period  Weeks    Status  New    Target Date  09/22/19      PT SHORT TERM GOAL #6   Title  Pt will verbalize understanding of fall prevention in home environment.    Time  5    Period  Weeks    Status  New    Target Date  09/22/19        PT Long Term Goals - 09/07/19 1030      PT LONG TERM GOAL #1   Title  Pt will be independent with progression of HEP for improved balance, strength, and gait.  TARGET extended x 1 week, due to missing 1 wk of appt 10/20/2019    Time  9    Period  Weeks    Status  New      PT LONG TERM GOAL #2   Title  Pt will improve TUG score to less than or equal to 13.5 seconds for decreased fall risk.    Time  9    Period  Weeks    Status  New      PT LONG TERM GOAL #3   Title  Pt will improve Berg score to at least 45/56 for decreased fall risk.    Time  9    Period  Weeks    Status  New      PT LONG TERM GOAL #4   Title  Pt will perform floor>stand transfer with UE support, with supervision, for improved fall recovery, floor>stand transfers.    Time  9    Period  Weeks    Status  New      PT LONG TERM GOAL #5   Title  Pt will verbalize plans for continued community fitness upon d/c from PT.    Time  9    Period  Weeks    Status  New            Plan - 09/11/19 1003    Clinical Impression Statement  Skilled session focused on exercises for posture and stretches along with gait.  Pt continues with forward head and pelvic tilt impacting pt's balance as well as tightness.  Pt works hard during session and is open to feedback and motivated to improve mobility.  continue PT per POC.    Personal Factors and Comorbidities  Comorbidity 2;Behavior Pattern   hx of falls   Comorbidities  HTN, hyperlipidemia    Examination-Activity  Limitations  Locomotion Level;Transfers;Squat;Stairs;Stand;Other    Examination-Participation Restrictions  Community Activity;Driving;Other   walking in neighborhood, exercise at fitness center   Stability/Clinical Decision Making  Evolving/Moderate complexity    PT Frequency  2x / week    PT Duration  Other (comment)   9 weeks, including eval week   PT Treatment/Interventions  ADLs/Self Care Home Management;Gait training;Stair training;Functional mobility training;Therapeutic activities;Therapeutic exercise;Balance training;Neuromuscular re-education;Patient/family education    PT Next Visit Plan  Review hamstring stretches and wall posture. continue with strength, balance, postural exercises and gait training with RW; begin checking STG's    PT Home Exercise Plan  Access Code: [email or web address] and Access Code: [email or web address]    Consulted and Agree with Plan of Care  Patient       Patient will benefit from skilled therapeutic intervention in order to improve the following  deficits and impairments:  Abnormal gait, Difficulty walking, Decreased activity tolerance, Decreased balance, Decreased mobility, Decreased strength, Postural dysfunction  Visit Diagnosis: Unsteadiness on feet  Other abnormalities of gait and mobility  Muscle weakness (generalized)     Problem List Patient Active Problem List   Diagnosis Date Noted  . Deficiency anemia 06/01/2019  . Vitamin D deficiency 06/01/2019  . Chronic idiopathic constipation 06/01/2019  . Traumatic subdural hematoma ([place]) 04/21/2019  . Hyperglycemia 04/15/2019  . Mild intermittent asthma with acute exacerbation 02/08/2018  . Hypothyroidism 03/14/2013  . Hyperlipidemia LDL goal <70 03/14/2013  . BPH (benign prostatic hypertrophy) 03/14/2013  . Hypogonadism, male 03/14/2013  . Erectile dysfunction 03/14/2013    Narda Bonds, PTA [place] 09/11/19 10:08 AM Phone: [phone] Fax:  [facility] [address] [place], [place], [address] Phone: [phone]   Fax:  [phone]  Name: Benjamin Hahn MRN: [id number] Date of Birth: Apr 20, 1933

## 2019-09-11 NOTE — Patient Instructions (Signed)
Posture Awareness    Stand and check posture: Jut chin, pull back to comfortable position. Tilt pelvis forward, back; be sure back is not swayed. Roll from heels to balls of feet, then distribute your weight evenly. Picture a line through spine pulling you erect. Focus on breathing. Good Posture = Better Breathing. Check ____ times per day.  http://gt2.exer.us/873   Copyright  VHI. All rights reserved.   Access Code: [email or web address]  URL: [email or web address]  Date: 09/11/2019  Prepared by: Nita Sells   Exercises  Seated Hamstring Stretch with Strap - 3 sets - 60 seconds hold - 3x daily - 7x weekly  Supine Hamstring Stretch with Strap - 3 sets - 60 seconds hold - 3x daily - 7x weekly  Correct Standing Posture - 3 sets - 60 seconds hold - 2x daily - 7x weekly

## 2019-09-13 ENCOUNTER — Ambulatory Visit (HOSPITAL_COMMUNITY): Payer: Medicare Other | Admitting: Psychiatry

## 2019-09-14 ENCOUNTER — Ambulatory Visit: Payer: Medicare Other | Admitting: Physical Therapy

## 2019-09-14 ENCOUNTER — Ambulatory Visit (INDEPENDENT_AMBULATORY_CARE_PROVIDER_SITE_OTHER): Payer: Medicare Other | Admitting: Psychiatry

## 2019-09-14 ENCOUNTER — Encounter: Payer: Self-pay | Admitting: Physical Therapy

## 2019-09-14 ENCOUNTER — Other Ambulatory Visit: Payer: Self-pay

## 2019-09-14 DIAGNOSIS — Z87891 Personal history of nicotine dependence: Secondary | ICD-10-CM | POA: Diagnosis not present

## 2019-09-14 DIAGNOSIS — R2689 Other abnormalities of gait and mobility: Secondary | ICD-10-CM | POA: Diagnosis not present

## 2019-09-14 DIAGNOSIS — R2681 Unsteadiness on feet: Secondary | ICD-10-CM | POA: Diagnosis not present

## 2019-09-14 DIAGNOSIS — M6281 Muscle weakness (generalized): Secondary | ICD-10-CM | POA: Diagnosis not present

## 2019-09-14 DIAGNOSIS — G4700 Insomnia, unspecified: Secondary | ICD-10-CM

## 2019-09-14 DIAGNOSIS — F325 Major depressive disorder, single episode, in full remission: Secondary | ICD-10-CM

## 2019-09-14 MED ORDER — BUPROPION HCL ER (XL) 300 MG PO TB24: 300.0000 mg | ORAL_TABLET | Freq: Every day | ORAL | 2 refills | Status: DC

## 2019-09-14 MED ORDER — VENLAFAXINE HCL ER 37.5 MG PO CP24: 37.5000 mg | ORAL_CAPSULE | Freq: Every day | ORAL | 6 refills | Status: DC

## 2019-09-14 MED ORDER — ALPRAZOLAM 0.25 MG PO TABS: ORAL_TABLET | ORAL | 4 refills | Status: DC

## 2019-09-14 MED ORDER — HYDROXYZINE PAMOATE 25 MG PO CAPS: ORAL_CAPSULE | ORAL | 4 refills | Status: DC

## 2019-09-14 NOTE — Therapy (Signed)
[facility] [address] [place], [place], [address] Phone: [phone]   Fax:  [phone]  Physical Therapy Treatment  Patient Details  Name: Benjamin Hahn MRN: [id number] Date of Birth: Oct 03, 1932 Referring Provider (PT): Jovita Gamma   Encounter Date: 09/14/2019  PT End of Session - 09/14/19 0948    Visit Number  6    Number of Visits  18    Date for PT Re-Evaluation  11/13/19    Authorization Type  Medicare/BCBS-Will need 10th visit progress note    PT Start Time  0849    PT Stop Time  0934    PT Time Calculation (min)  45 min    Activity Tolerance  Patient tolerated treatment well   Several brief seated rest breaks due to fatigue   Behavior During Therapy  [facility] for tasks assessed/performed       Past Medical History:  Diagnosis Date  . Anxiety   . Arthritis    might be in back, no problems  . BPH (benign prostatic hyperplasia)   . Depression 1990s  . GERD (gastroesophageal reflux disease)    occasional  . Hypercholesteremia    controled  . Hypothyroid   . Shingles May 2014   "Right face, still has some"  . Temporal arteritis ([place])   . Transient blindness of both eyes     Past Surgical History:  Procedure Laterality Date  . ARTERY BIOPSY Right 06/20/2013   Procedure: BIOPSY TEMPORAL ARTERY RIGHT;  Surgeon: Earnstine Regal, MD;  Location: [facility];  Service: General;  Laterality: Right;  . CARDIAC CATHETERIZATION N/A 09/02/2015   Procedure: Left Heart Cath and Coronary Angiography;  Surgeon: Charolette Forward, MD;  Location: [facility] [place] [facility];  Service: Cardiovascular;  Laterality: N/A;  . CARDIAC CATHETERIZATION N/A 09/02/2015   Procedure: Coronary Stent Intervention;  Surgeon: Charolette Forward, MD;  Location: [facility];  Service: Cardiovascular;  Laterality: N/A;  1.  mid RCA      (3.0/28mm Xience) 2.  Mid LAD      (3.0/23mm Xience)  . CRANIOTOMY N/A 04/19/2019   Procedure: CRANIOTOMY  HEMATOMA EVACUATION SUBDURAL;  Surgeon: Jovita Gamma, MD;  Location: [facility];  Service: Neurosurgery;  Laterality: N/A;  CRANIOTOMY HEMATOMA EVACUATION SUBDURAL  . None      There were no vitals filed for this visit.  Subjective Assessment - 09/14/19 0851    Subjective  Been doing the hamstring exercise.  I like that one; feel like it's helping my walking.    Patient Stated Goals  Pt's goals are to improve walking, balance, strength.  Would like to get back to driving.    Currently in Pain?  No/denies                       [facility] Adult PT Treatment/Exercise - 09/14/19 0001      Ambulation/Gait   Ambulation/Gait  Yes    Ambulation/Gait Assistance  5: Supervision;4: Min guard    Ambulation/Gait Assistance Details  Cues for equal, even step length, heelstrike, arm swing.  Utilized bilateral walking poles to facilitate reciprocal arm swing.  Once walking poles removed, PT provides tactile cues through shoulders for arm swing, then when cues removed, pt is able to maintain reciprocal arm swing with occasional verbal cues.    Ambulation Distance (Feet)  345 Feet   x 2, 80 ft, 40 ft x 2   Assistive device  None    Gait  Pattern  Step-through pattern;Decreased step length - right;Decreased step length - left;Narrow base of support;Trunk flexed;Shuffle   Improved heelstrike, arm swing with cues and practice   Ambulation Surface  Level;Indoor      Posture/Postural Control   Posture/Postural Control  Postural limitations    Postural Limitations  Rounded Shoulders;Forward head;Posterior pelvic tilt    Posture Comments  Seated posture exercises:  neck retraction, 2 sets x 10 reps, cues for 3 sec hold (towel behind head for cues)      High Level Balance   High Level Balance Comments  Standing stagger stance position, forward/back rocking, to faciliate improved weightshifting through hips as well as active ankle dorsiflexion, 2 sets x 10 reps.  At counter, 1 UE support, forward step  and weightshift, then return to middle, x 15 reps-cues for heelstrike, optimal step length, foot clearance to return to midline.  Cues for forward weightshift onto forward leg      Exercises   Exercises  Knee/Hip;Ankle      Knee/Hip Exercises: Stretches   Active Hamstring Stretch  Right;Left;3 reps;30 seconds    Active Hamstring Stretch Limitations  Pt has difficulty with review of seated hamstring stretch with strap (tends to sit posteriorly at chair back and lift leg into knee extension).  Cued patient (multi-modal cues) for scooting to edge, sitting tall posture with straight knee, leg propped on floor.  Pt able to feel this stretch and likes better than using strap.  Pt has to repeatedly cue pt not to bounce during stretch and to only hold at point of initial stretch, not into pain.      Knee/Hip Exercises: Standing   Other Standing Knee Exercises  Alternating heel raises, at chair for support, x 10 reps each side-cues to slow pace        Neuro Re-education (Continued from above): Along counter:  Forward, back walking with cues for equal, even step length, heelstrike (forward), toe>heel placement (backward), 3 reps with UE support      PT Education - 09/14/19 0948    Education Details  Reviewed/revised seated hamstring stretch; additions to HEP    Person(s) Educated  Patient    Methods  Explanation;Demonstration;Verbal cues;Handout;Tactile cues    Comprehension  Verbalized understanding;Returned demonstration;Verbal cues required;Need further instruction       PT Short Term Goals - 09/07/19 1026      PT SHORT TERM GOAL #1   Title  Pt will be independent with HEP for improved strength, balance, and gait.  TARGET 09/22/2019 (date extended x 1 week, due to missing 1 week of therapy-scheduling concerns)    Time  5    Period  Weeks    Status  New    Target Date  09/22/19      PT SHORT TERM GOAL #2   Title  Pt will improve TUG score to less than or equal to 15 seconds for  decreased fall risk.    Time  5    Period  Weeks    Status  New    Target Date  09/22/19      PT SHORT TERM GOAL #3   Title  Pt will improve Berg Balance score to at least 38/56 for decreased fall risk.    Time  5    Period  Weeks    Status  New    Target Date  09/22/19      PT SHORT TERM GOAL #4   Title  Pt will improve 5x sit<>stand to  less than or equal to 12.5 seconds for decreased fall risk.    Time  5    Period  Weeks    Status  New    Target Date  09/22/19      PT SHORT TERM GOAL #5   Title  Pt will negotiate at least 12 steps, using one handrail with step through pattern, with supervision, for improved ability to return to [employer].    Time  5    Period  Weeks    Status  New    Target Date  09/22/19      PT SHORT TERM GOAL #6   Title  Pt will verbalize understanding of fall prevention in home environment.    Time  5    Period  Weeks    Status  New    Target Date  09/22/19        PT Long Term Goals - 09/07/19 1030      PT LONG TERM GOAL #1   Title  Pt will be independent with progression of HEP for improved balance, strength, and gait.  TARGET extended x 1 week, due to missing 1 wk of appt 10/20/2019    Time  9    Period  Weeks    Status  New      PT LONG TERM GOAL #2   Title  Pt will improve TUG score to less than or equal to 13.5 seconds for decreased fall risk.    Time  9    Period  Weeks    Status  New      PT LONG TERM GOAL #3   Title  Pt will improve Berg score to at least 45/56 for decreased fall risk.    Time  9    Period  Weeks    Status  New      PT LONG TERM GOAL #4   Title  Pt will perform floor>stand transfer with UE support, with supervision, for improved fall recovery, floor>stand transfers.    Time  9    Period  Weeks    Status  New      PT LONG TERM GOAL #5   Title  Pt will verbalize plans for continued community fitness upon d/c from PT.    Time  9    Period  Weeks    Status  New            Plan -  09/14/19 0949    Clinical Impression Statement  Pt feels hamstring stretches are helpful and he can tell the difference with gait.  Pt continues to need frequent cueing for correct technique with hamstring stretches, even with revisions made to HEP.  Pt very receptive with nice carryover with standing activities to increase step length, heelstrike, and reciprocal arm swing with use of walking poles.  Once walking poles removed, he is able to maintain the reciprocal arm swing and increased step length pattern with tactile>verbal  cues.  He continues to have forward head posture with gait.  He will continue to benefit from skilled PT to address balance, strength, flexibility, and overall improved gait and functional mobility.    Personal Factors and Comorbidities  Comorbidity 2;Behavior Pattern   hx of falls   Comorbidities  HTN, hyperlipidemia    Examination-Activity Limitations  Locomotion Level;Transfers;Squat;Stairs;Stand;Other    Examination-Participation Restrictions  Community Activity;Driving;Other   walking in neighborhood, exercise at fitness center   Stability/Clinical Decision Making  Evolving/Moderate complexity  PT Frequency  2x / week    PT Duration  Other (comment)   9 weeks, including eval week   PT Treatment/Interventions  ADLs/Self Care Home Management;Gait training;Stair training;Functional mobility training;Therapeutic activities;Therapeutic exercise;Balance training;Neuromuscular re-education;Patient/family education    PT Next Visit Plan  Review hamstring stretches, posture, and 09/14/19 additions to HEP. continue with strength, balance, postural exercises and gait training with RW; Check STGs    PT Home Exercise Plan  Access Code: [email or web address] and Access Code: [email or web address]    Consulted and Agree with Plan of Care  Patient       Patient will benefit from skilled therapeutic intervention in order to improve the following deficits and impairments:  Abnormal gait, Difficulty walking,  Decreased activity tolerance, Decreased balance, Decreased mobility, Decreased strength, Postural dysfunction  Visit Diagnosis: Muscle weakness (generalized)  Other abnormalities of gait and mobility  Unsteadiness on feet     Problem List Patient Active Problem List   Diagnosis Date Noted  . Deficiency anemia 06/01/2019  . Vitamin D deficiency 06/01/2019  . Chronic idiopathic constipation 06/01/2019  . Traumatic subdural hematoma ([place]) 04/21/2019  . Hyperglycemia 04/15/2019  . Mild intermittent asthma with acute exacerbation 02/08/2018  . Hypothyroidism 03/14/2013  . Hyperlipidemia LDL goal <70 03/14/2013  . BPH (benign prostatic hypertrophy) 03/14/2013  . Hypogonadism, male 03/14/2013  . Erectile dysfunction 03/14/2013    Junko Ohagan W. 09/14/2019, 9:53 AM Mady Haagensen, PT 09/14/19 9:54 AM Phone: [phone] Fax: [facility] [address] [place], [place], [address] Phone: [phone]   Fax:  [phone]  Name: Benjamin Hahn MRN: [id number] Date of Birth: [birth year]

## 2019-09-14 NOTE — Progress Notes (Signed)
Patient ID: Benjamin Hahn, male   DOB: 08-30-1932, 86 y.o.   MRN: [id number] [facility] MD/PA/NP OP Progress Note  09/14/2019 1:58 PM Benjamin Hahn  MRN:  [id number]  Chief Complaint: Major depression remission Subjective: Doing great  Today the patient is doing well.  He is at work.  The patient denies daily depression.  No longer drives.  Uses a walker.  He is just finishing up his physical therapy which should be over in a few months.  At that time we will also discontinue working.  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  Is gotten over his bladder infection and neurologically he is stable.  He likes to write.  He listens to TV.  He is sleeping fairly well.  He sleeps from 10:00 at night to 3 in the morning.  He then takes a Tylenol and 20 minutes later goes back to bed for a few more hours.  He does not take naps and is not sleepy.  He is eating well.  He is got a reasonable amount of energy.  He drinks no alcohol.  He takes his medicines just as prescribed.  His wife is interested in moving to [place].  He is ambivalent.  The patient has 2 adult children who are doing well.  Generally the patient is functioning well.  He still goes into work for just a few hours a day.  Is going to have to deal with retirement which is just around the corner.  Past Medical History:  Diagnosis Date  . Anxiety   . Arthritis    might be in back, no problems  . BPH (benign prostatic hyperplasia)   . Depression 1990s  . GERD (gastroesophageal reflux disease)    occasional  . Hypercholesteremia    controled  . Hypothyroid   . Shingles May 2014   "Right face, still has some"  . Temporal arteritis ([place])   . Transient blindness of both eyes     Past Surgical History:  Procedure Laterality Date  . ARTERY BIOPSY Right 06/20/2013   Procedure: BIOPSY TEMPORAL ARTERY RIGHT;  Surgeon: Earnstine Regal, MD;  [facility];  Service: General;  Laterality: Right;  . CARDIAC CATHETERIZATION N/A 09/02/2015    Procedure: Left Heart Cath and Coronary Angiography;  Surgeon: Charolette Forward, MD;  Location: [place] [facility];  Service: Cardiovascular;  Laterality: N/A;  . CARDIAC CATHETERIZATION N/A 09/02/2015   Procedure: Coronary Stent Intervention;  Surgeon: Charolette Forward, MD;  Location: [place] [facility];  Service: Cardiovascular;  Laterality: N/A;  1.  mid RCA      (3.0/28mm Xience) 2.  Mid LAD      (3.0/23mm Xience)  . CRANIOTOMY N/A 04/19/2019   Procedure: CRANIOTOMY HEMATOMA EVACUATION SUBDURAL;  Surgeon: Jovita Gamma, MD;  Location: [place];  Service: Neurosurgery;  Laterality: N/A;  CRANIOTOMY HEMATOMA EVACUATION SUBDURAL  . None      Family Psychiatric History:   Family History:  Family History  Problem Relation Age of Onset  . Stroke Father        Died, 80s  . Stroke Sister        Living, 79  . Heart disease Mother        Died, 5  . Healthy Daughter   . Diabetes Maternal Grandmother   . Hypertension Neg Hx     Social History:  Social History   Socioeconomic History  . Marital status: Married    Spouse name: Not on file  .  Number of children: Not on file  . Years of education: Not on file  . Highest education level: Not on file  Occupational History  . Not on file  Tobacco Use  . Smoking status: Former Smoker    Types: Cigarettes    Quit date: 08/24/1966    Years since quitting: 53.0  . Smokeless tobacco: Never Used  Substance and Sexual Activity  . Alcohol use: Yes    Alcohol/week: 2.0 standard drinks    Types: 2 Shots of liquor per week    Comment: socially  . Drug use: No  . Sexual activity: Not on file  Other Topics Concern  . Not on file  Social History Narrative   Currently works as a Midwife.  Lives with wife and they have two healthy daughters.   Social Determinants of Health   Financial Resource Strain:   . Difficulty of Paying Living Expenses: Not on file  Food Insecurity:   . Worried About Charity fundraiser in the Last Year: Not on file   . Ran Out of Food in the Last Year: Not on file  Transportation Needs:   . Lack of Transportation (Medical): Not on file  . Lack of Transportation (Non-Medical): Not on file  Physical Activity:   . Days of Exercise per Week: Not on file  . Minutes of Exercise per Session: Not on file  Stress:   . Feeling of Stress : Not on file  Social Connections:   . Frequency of Communication with Friends and Family: Not on file  . Frequency of Social Gatherings with Friends and Family: Not on file  . Attends Religious Services: Not on file  . Active Member of Clubs or Organizations: Not on file  . Attends Archivist Meetings: Not on file  . Marital Status: Not on file    Allergies:  Allergies  Allergen Reactions  . Lorazepam Anxiety    Metabolic Disorder Labs: Lab Results  Component Value Date   HGBA1C 5.5 04/14/2019   No results found for: PROLACTIN Lab Results  Component Value Date   CHOL 126 06/01/2019   TRIG 91.0 06/01/2019   HDL 56.50 06/01/2019   CHOLHDL 2 06/01/2019   VLDL 18.2 06/01/2019   LDLCALC 51 06/01/2019   LDLCALC 52 07/28/2018     Current Medications: Current Outpatient Medications  Medication Sig Dispense Refill  . acetaminophen (TYLENOL) 325 MG tablet Take 1-2 tablets (325-650 mg total) by mouth every 4 (four) hours as needed for mild pain or headache.    . ALPRAZolam (XANAX) 0.25 MG tablet 1  qhs 30 tablet 4  . atorvastatin (LIPITOR) 40 MG tablet Take 1 tablet (40 mg total) by mouth daily at 6 PM. 30 tablet 3  . buPROPion (WELLBUTRIN XL) 300 MG 24 hr tablet Take 1 tablet (300 mg total) by mouth daily. 30 tablet 2  . hydrOXYzine (VISTARIL) 25 MG capsule 1qhs  ( every other night) 30 capsule 4  . levothyroxine (SYNTHROID) 88 MCG tablet TAKE 1 TABLET BY MOUTH DAILY. 90 tablet 1  . lidocaine (LIDODERM) 5 % Place 1 patch onto the skin daily. Remove & Discard patch within 12 hours or as directed by MD (Patient not taking: Reported on 08/15/2019) 30 patch  0  . linaclotide (LINZESS) 72 MCG capsule Take 1 capsule (72 mcg total) by mouth daily before breakfast. 84 capsule 0  . metoprolol succinate (TOPROL-XL) 25 MG 24 hr tablet Take 0.5 tablets (12.5 mg total) by mouth daily.  30 tablet 0  . nitroGLYCERIN (NITROSTAT) 0.4 MG SL tablet Place 1 tablet (0.4 mg total) under the tongue every 5 (five) minutes as needed for chest pain (CP or SOB). 25 tablet 12  . tamsulosin (FLOMAX) 0.4 MG CAPS capsule Take 1 capsule (0.4 mg total) by mouth daily after supper. 30 capsule 0  . venlafaxine XR (EFFEXOR-XR) 37.5 MG 24 hr capsule Take 1 capsule (37.5 mg total) by mouth daily. 30 capsule 6   No current facility-administered medications for this visit.    Neurologic: Headache: No Seizure: No Paresthesias: No  Musculoskeletal: Strength & Muscle Tone: within normal limits Gait & Station: normal Patient leans: NA  Psychiatric Specialty Exam: ROS  There were no vitals taken for this visit.There is no height or weight on file to calculate BMI.  General Appearance: Fairly Groomed  Eye Contact:  Good  Speech:  Clear and Coherent  Volume:  Normal  Mood:  Euthymic  Affect:  Appropriate  Thought Process:  Coherent  Orientation:  Full (Time, Place, and Person)  Thought Content:  WDL  Suicidal Thoughts:  No  Homicidal Thoughts:  No  Memory:  NA  Judgement:  Good  Insight:  Good  Psychomotor Activity:  Normal  Concentration:  Good  Recall:  Good  Fund of Knowledge: Good  Language: Good  Akathisia:  No  Handed:  Right  AIMS (if indicated):    Assets:  Desire for Improvement  ADL's:  Intact  Cognition: WNL  Sleep:      Treatment/plan  This patient is #1 problem is that of major clinical depression.  He takes Effexor 37.5 mg and Wellbutrin 150 mg.  He shows no evidence of depression.  His mood is great.  We clarified he no longer takes imipramine.  His second problem is that of insomnia.  He takes Xanax 0.25 mg at night.  He claims it works well  except it constipates him.  I shared with him that this is very unusual.  Today our plan is for him to continue taking Xanax but to take it every other day.  Her plan is that the days he does not take Xanax will take Vistaril 25 mg at night.  So he will alternate between Xanax and Vistaril.  He is in agreement with this plan.  Patient denies any physical complaints at this time.  He is functioning very well.  Return to see me in 3 months.  09/14/2019, 1:58 PM [facility] MD Progress Note  09/14/2019 1:58 PM Benjamin Hahn  MRN:  [id number] Subjective:  Feels well Principal Problem: Major depression, chronic, mild/residual Diagnosis:  Major depression, recurrent residual Today the patient is doing fairly well. He's been having some medical problems. He recently went for Thanksgiving to a length but unfortunately had a urinary tract infection. He also since then has been coughing and recently started on antibiotic. Emotionally he is fairly stable. He denies daily depression. He stays very active at work. He's been unable to exercise. The patient is sleeping well and has an increase in his appetite. His energy is good. Is no problems thinking or concentrating. He is recently having a productive cough but is no shortness of breath. He denies any chest pain. Is a stable relationship with his wife. His kidneys are good. He has 4 grandchildren. Everybody in his home is good. He only issue is that a number mornings he awakens and he feels oversedated. It occurs point of his wife feels uncomfortable with him driving will  taken to work. Today reviewed his medications and is evident that he likely needs reduction. He also notes that his memory has change in his and is good. 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 Patient Active Problem List   Diagnosis Date Noted  . Deficiency anemia [D53.9] 06/01/2019  . Vitamin D deficiency [E55.9] 06/01/2019  . Chronic idiopathic  constipation [K59.04] 06/01/2019  . Traumatic subdural hematoma ([place]) [S06.5X9A] 04/21/2019  . Hyperglycemia [R73.9] 04/15/2019  . Mild intermittent asthma with acute exacerbation [J45.21] 02/08/2018  . Hypothyroidism [E03.9] 03/14/2013  . Hyperlipidemia LDL goal <70 [E78.5] 03/14/2013  . BPH (benign prostatic hypertrophy) [N40.0] 03/14/2013  . Hypogonadism, male [E29.1] 03/14/2013  . Erectile dysfunction [N52.9] 03/14/2013   Total Time spent with patient: 30 minutes  Past Psychiatric History:   Past Medical History:  Past Medical History:  Diagnosis Date  . Anxiety   . Arthritis    might be in back, no problems  . BPH (benign prostatic hyperplasia)   . Depression 1990s  . GERD (gastroesophageal reflux disease)    occasional  . Hypercholesteremia    controled  . Hypothyroid   . Shingles May 2014   "Right face, still has some"  . Temporal arteritis ([place])   . Transient blindness of both eyes     Past Surgical History:  Procedure Laterality Date  . ARTERY BIOPSY Right 06/20/2013   Procedure: BIOPSY TEMPORAL ARTERY RIGHT;  Surgeon: Earnstine Regal, MD;  [facility];  Service: General;  Laterality: Right;  . CARDIAC CATHETERIZATION N/A 09/02/2015   Procedure: Left Heart Cath and Coronary Angiography;  Surgeon: Charolette Forward, MD;  Location: [place] [facility];  Service: Cardiovascular;  Laterality: N/A;  . CARDIAC CATHETERIZATION N/A 09/02/2015   Procedure: Coronary Stent Intervention;  Surgeon: Charolette Forward, MD;  Location: [place] [facility];  Service: Cardiovascular;  Laterality: N/A;  1.  mid RCA      (3.0/28mm Xience) 2.  Mid LAD      (3.0/23mm Xience)  . CRANIOTOMY N/A 04/19/2019   Procedure: CRANIOTOMY HEMATOMA EVACUATION SUBDURAL;  Surgeon: Jovita Gamma, MD;  Location: [place];  Service: Neurosurgery;  Laterality: N/A;  CRANIOTOMY HEMATOMA EVACUATION SUBDURAL  . None     Family History:  Family History  Problem Relation Age of Onset  . Stroke Father        Died,  80s  . Stroke Sister        Living, 20  . Heart disease Mother        Died, 57  . Healthy Daughter   . Diabetes Maternal Grandmother   . Hypertension Neg Hx    Family Psychiatric  History:  Social History:  Social History   Substance and Sexual Activity  Alcohol Use Yes  . Alcohol/week: 2.0 standard drinks  . Types: 2 Shots of liquor per week   Comment: socially     Social History   Substance and Sexual Activity  Drug Use No    Social History   Socioeconomic History  . Marital status: Married    Spouse name: Not on file  . Number of children: Not on file  . Years of education: Not on file  . Highest education level: Not on file  Occupational History  . Not on file  Tobacco Use  . Smoking status: Former Smoker    Types: Cigarettes    Quit date: 08/24/1966    Years since quitting: 53.0  .  Smokeless tobacco: Never Used  Substance and Sexual Activity  . Alcohol use: Yes    Alcohol/week: 2.0 standard drinks    Types: 2 Shots of liquor per week    Comment: socially  . Drug use: No  . Sexual activity: Not on file  Other Topics Concern  . Not on file  Social History Narrative   Currently works as a Midwife.  Lives with wife and they have two healthy daughters.   Social Determinants of Health   Financial Resource Strain:   . Difficulty of Paying Living Expenses: Not on file  Food Insecurity:   . Worried About Charity fundraiser in the Last Year: Not on file  . Ran Out of Food in the Last Year: Not on file  Transportation Needs:   . Lack of Transportation (Medical): Not on file  . Lack of Transportation (Non-Medical): Not on file  Physical Activity:   . Days of Exercise per Week: Not on file  . Minutes of Exercise per Session: Not on file  Stress:   . Feeling of Stress : Not on file  Social Connections:   . Frequency of Communication with Friends and Family: Not on file  . Frequency of Social Gatherings with Friends and Family: Not on file  . Attends  Religious Services: Not on file  . Active Member of Clubs or Organizations: Not on file  . Attends Archivist Meetings: Not on file  . Marital Status: Not on file   Additional Social History:                         Sleep: Good  Appetite:  Good  Current Medications: Current Outpatient Medications  Medication Sig Dispense Refill  . acetaminophen (TYLENOL) 325 MG tablet Take 1-2 tablets (325-650 mg total) by mouth every 4 (four) hours as needed for mild pain or headache.    . ALPRAZolam (XANAX) 0.25 MG tablet 1  qhs 30 tablet 4  . atorvastatin (LIPITOR) 40 MG tablet Take 1 tablet (40 mg total) by mouth daily at 6 PM. 30 tablet 3  . buPROPion (WELLBUTRIN XL) 300 MG 24 hr tablet Take 1 tablet (300 mg total) by mouth daily. 30 tablet 2  . hydrOXYzine (VISTARIL) 25 MG capsule 1qhs  ( every other night) 30 capsule 4  . levothyroxine (SYNTHROID) 88 MCG tablet TAKE 1 TABLET BY MOUTH DAILY. 90 tablet 1  . lidocaine (LIDODERM) 5 % Place 1 patch onto the skin daily. Remove & Discard patch within 12 hours or as directed by MD (Patient not taking: Reported on 08/15/2019) 30 patch 0  . linaclotide (LINZESS) 72 MCG capsule Take 1 capsule (72 mcg total) by mouth daily before breakfast. 84 capsule 0  . metoprolol succinate (TOPROL-XL) 25 MG 24 hr tablet Take 0.5 tablets (12.5 mg total) by mouth daily. 30 tablet 0  . nitroGLYCERIN (NITROSTAT) 0.4 MG SL tablet Place 1 tablet (0.4 mg total) under the tongue every 5 (five) minutes as needed for chest pain (CP or SOB). 25 tablet 12  . tamsulosin (FLOMAX) 0.4 MG CAPS capsule Take 1 capsule (0.4 mg total) by mouth daily after supper. 30 capsule 0  . venlafaxine XR (EFFEXOR-XR) 37.5 MG 24 hr capsule Take 1 capsule (37.5 mg total) by mouth daily. 30 capsule 6   No current facility-administered medications for this visit.    Lab Results:  No results found for this or any previous visit (from the past  48 hour(s)).  Physical Findings: AIMS:   , ,  ,  ,    CIWA:    COWS:     Musculoskeletal: Strength & Muscle Tone: within normal limits Gait & Station: normal Patient leans: Right  Psychiatric Specialty Exam: ROS  There were no vitals taken for this visit.There is no height or weight on file to calculate BMI.  General Appearance: Negative  Eye Contact::  Good  Speech:  Clear and Coherent  Volume:  Normal  Mood:  NA  Affect:  Appropriate  Thought Process:  Coherent  Orientation:  Full (Time, Place, and Person)  Thought Content:  WDL  Suicidal Thoughts:  No  Homicidal Thoughts:  No  Memory:  NA  Judgement:  Good  Insight:  Good  Psychomotor Activity:  Normal  Concentration:  Good  Recall:  Good  Fund of Knowledge:Good  Language: Good  Akathisia:  No  Handed:  Right  AIMS (if indicated):     Assets:  Desire for Improvement  ADL's:  Intact  Cognition: WNL  Sleep:      [facility]. [facility] MD Progress Note  09/14/2019 1:58 PM Benjamin Hahn  MRN:  [id number] Subjective:  Feels well Principal Problem: Major depression, chronic, mild/residual Diagnosis:  Major depression, recurrent residual Today the patient is doing fairly well. He's been having some medical problems. He recently went for Thanksgiving to a length but unfortunately had a urinary tract infection. He also since then has been coughing and recently started on antibiotic. Emotionally he is fairly stable. He denies daily depression. He stays very active at work. He's been unable to exercise. The patient is sleeping well and has an increase in his appetite. His energy is good. Is no problems thinking or concentrating. He is recently having a productive cough but is no shortness of breath. He denies any chest pain. Is a stable relationship with his wife. His kidneys are good. He has 4 grandchildren. Everybody in his home is good. He only issue is that a number mornings he awakens and he feels oversedated. It occurs point of his wife feels uncomfortable  with him driving will taken to work. Today reviewed his medications and is evident that he likely needs reduction. He also notes that his memory has change in his and is good. 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 Patient Active Problem List   Diagnosis Date Noted  . Deficiency anemia [D53.9] 06/01/2019  . Vitamin D deficiency [E55.9] 06/01/2019  . Chronic idiopathic constipation [K59.04] 06/01/2019  . Traumatic subdural hematoma ([place]) [S06.5X9A] 04/21/2019  . Hyperglycemia [R73.9] 04/15/2019  . Mild intermittent asthma with acute exacerbation [J45.21] 02/08/2018  . Hypothyroidism [E03.9] 03/14/2013  . Hyperlipidemia LDL goal <70 [E78.5] 03/14/2013  . BPH (benign prostatic hypertrophy) [N40.0] 03/14/2013  . Hypogonadism, male [E29.1] 03/14/2013  . Erectile dysfunction [N52.9] 03/14/2013   Total Time spent with patient: 30 minutes  Past Psychiatric History:   Past Medical History:  Past Medical History:  Diagnosis Date  . Anxiety   . Arthritis    might be in back, no problems  . BPH (benign prostatic hyperplasia)   . Depression 1990s  . GERD (gastroesophageal reflux disease)    occasional  . Hypercholesteremia    controled  . Hypothyroid   . Shingles May 2014   "Right face, still has some"  . Temporal arteritis ([place])   . Transient blindness of both eyes  Past Surgical History:  Procedure Laterality Date  . ARTERY BIOPSY Right 06/20/2013   Procedure: BIOPSY TEMPORAL ARTERY RIGHT;  Surgeon: Earnstine Regal, MD;  [facility];  Service: General;  Laterality: Right;  . CARDIAC CATHETERIZATION N/A 09/02/2015   Procedure: Left Heart Cath and Coronary Angiography;  Surgeon: Charolette Forward, MD;  Location: [place] [facility];  Service: Cardiovascular;  Laterality: N/A;  . CARDIAC CATHETERIZATION N/A 09/02/2015   Procedure: Coronary Stent Intervention;  Surgeon: Charolette Forward, MD;  Location: [place] [facility];  Service:  Cardiovascular;  Laterality: N/A;  1.  mid RCA      (3.0/28mm Xience) 2.  Mid LAD      (3.0/23mm Xience)  . CRANIOTOMY N/A 04/19/2019   Procedure: CRANIOTOMY HEMATOMA EVACUATION SUBDURAL;  Surgeon: Jovita Gamma, MD;  Location: [place];  Service: Neurosurgery;  Laterality: N/A;  CRANIOTOMY HEMATOMA EVACUATION SUBDURAL  . None     Family History:  Family History  Problem Relation Age of Onset  . Stroke Father        Died, 80s  . Stroke Sister        Living, 14  . Heart disease Mother        Died, 66  . Healthy Daughter   . Diabetes Maternal Grandmother   . Hypertension Neg Hx    Family Psychiatric  History:  Social History:  Social History   Substance and Sexual Activity  Alcohol Use Yes  . Alcohol/week: 2.0 standard drinks  . Types: 2 Shots of liquor per week   Comment: socially     Social History   Substance and Sexual Activity  Drug Use No    Social History   Socioeconomic History  . Marital status: Married    Spouse name: Not on file  . Number of children: Not on file  . Years of education: Not on file  . Highest education level: Not on file  Occupational History  . Not on file  Tobacco Use  . Smoking status: Former Smoker    Types: Cigarettes    Quit date: 08/24/1966    Years since quitting: 53.0  . Smokeless tobacco: Never Used  Substance and Sexual Activity  . Alcohol use: Yes    Alcohol/week: 2.0 standard drinks    Types: 2 Shots of liquor per week    Comment: socially  . Drug use: No  . Sexual activity: Not on file  Other Topics Concern  . Not on file  Social History Narrative   Currently works as a Midwife.  Lives with wife and they have two healthy daughters.   Social Determinants of Health   Financial Resource Strain:   . Difficulty of Paying Living Expenses: Not on file  Food Insecurity:   . Worried About Charity fundraiser in the Last Year: Not on file  . Ran Out of Food in the Last Year: Not on file  Transportation Needs:   .  Lack of Transportation (Medical): Not on file  . Lack of Transportation (Non-Medical): Not on file  Physical Activity:   . Days of Exercise per Week: Not on file  . Minutes of Exercise per Session: Not on file  Stress:   . Feeling of Stress : Not on file  Social Connections:   . Frequency of Communication with Friends and Family: Not on file  . Frequency of Social Gatherings with Friends and Family: Not on file  . Attends Religious Services: Not on file  . Active  Member of Clubs or Organizations: Not on file  . Attends Archivist Meetings: Not on file  . Marital Status: Not on file   Additional Social History:                         Sleep: Good  Appetite:  Good  Current Medications: Current Outpatient Medications  Medication Sig Dispense Refill  . acetaminophen (TYLENOL) 325 MG tablet Take 1-2 tablets (325-650 mg total) by mouth every 4 (four) hours as needed for mild pain or headache.    . ALPRAZolam (XANAX) 0.25 MG tablet 1  qhs 30 tablet 4  . atorvastatin (LIPITOR) 40 MG tablet Take 1 tablet (40 mg total) by mouth daily at 6 PM. 30 tablet 3  . buPROPion (WELLBUTRIN XL) 300 MG 24 hr tablet Take 1 tablet (300 mg total) by mouth daily. 30 tablet 2  . hydrOXYzine (VISTARIL) 25 MG capsule 1qhs  ( every other night) 30 capsule 4  . levothyroxine (SYNTHROID) 88 MCG tablet TAKE 1 TABLET BY MOUTH DAILY. 90 tablet 1  . lidocaine (LIDODERM) 5 % Place 1 patch onto the skin daily. Remove & Discard patch within 12 hours or as directed by MD (Patient not taking: Reported on 08/15/2019) 30 patch 0  . linaclotide (LINZESS) 72 MCG capsule Take 1 capsule (72 mcg total) by mouth daily before breakfast. 84 capsule 0  . metoprolol succinate (TOPROL-XL) 25 MG 24 hr tablet Take 0.5 tablets (12.5 mg total) by mouth daily. 30 tablet 0  . nitroGLYCERIN (NITROSTAT) 0.4 MG SL tablet Place 1 tablet (0.4 mg total) under the tongue every 5 (five) minutes as needed for chest pain (CP or  SOB). 25 tablet 12  . tamsulosin (FLOMAX) 0.4 MG CAPS capsule Take 1 capsule (0.4 mg total) by mouth daily after supper. 30 capsule 0  . venlafaxine XR (EFFEXOR-XR) 37.5 MG 24 hr capsule Take 1 capsule (37.5 mg total) by mouth daily. 30 capsule 6   No current facility-administered medications for this visit.    Lab Results:  No results found for this or any previous visit (from the past 48 hour(s)).  Physical Findings: AIMS:  , ,  ,  ,    CIWA:    COWS:     Musculoskeletal: Strength & Muscle Tone: within normal limits Gait & Station: normal Patient leans: Right  Psychiatric Specialty Exam: ROS  There were no vitals taken for this visit.There is no height or weight on file to calculate BMI.  General Appearance: Negative  Eye Contact::  Good  Speech:  Clear and Coherent  Volume:  Normal  Mood:  NA  Affect:  Appropriate  Thought Process:  Coherent  Orientation:  Full (Time, Place, and Person)  Thought Content:  WDL  Suicidal Thoughts:  No  Homicidal Thoughts:  No  Memory:  NA  Judgement:  Good  Insight:  Good  Psychomotor Activity:  Normal  Concentration:  Good  Recall:  Good  Fund of Knowledge:Good  Language: Good  Akathisia:  No  Handed:  Right  AIMS (if indicated):     Assets:  Desire for Improvement  ADL's:  Intact  Cognition: WNL  Sleep:      Treatment Plan Summary: This patient has 2 stable problems.  His first problem is that of clinical depression.  He takes Effexor and does very well.  He also takes a small dose of imipramine at night.  His second problem is that of insomnia.  He takes melatonin and at times he takes Restoril 15 mg.  Today he is assess to slightly increase his Xanax.  We will go ahead and increase it to a dose of 0.25 mg taking 2 at night.  Patient is no longer drinking.  He is functioning very well.  He will return to see me in 3 months.

## 2019-09-14 NOTE — Patient Instructions (Addendum)
Access Code: [email or web address]  URL: [email or web address]  Date: 09/14/2019  Prepared by: Mady Haagensen   Exercises Seated Hamstring Stretch with Strap - 3 sets - 60 seconds hold - 3x daily - 7x weekly-REVISED-see below Supine Hamstring Stretch with Strap - 3 sets - 60 seconds hold - 3x daily - 7x weekly Correct Standing Posture - 3 sets - 60 seconds hold - 2x daily - 7x weekly  09/13/2018 additions:  Staggered Stance Forward Backward Weight Shift with Counter Support - 10 reps - 1-2 sets - 1x daily - 5x weekly Alternating Heel Raises - 10 reps - 1-2 sets - 1x daily - 5x weekly Seated Hamstring Stretch - 3 reps - 1 sets - 30 sec hold - 2-3x daily - 7x weekly

## 2019-09-18 ENCOUNTER — Encounter: Payer: Self-pay | Admitting: Physical Therapy

## 2019-09-18 ENCOUNTER — Other Ambulatory Visit: Payer: Self-pay

## 2019-09-18 ENCOUNTER — Ambulatory Visit: Payer: Medicare Other | Admitting: Physical Therapy

## 2019-09-18 DIAGNOSIS — M6281 Muscle weakness (generalized): Secondary | ICD-10-CM

## 2019-09-18 DIAGNOSIS — R2689 Other abnormalities of gait and mobility: Secondary | ICD-10-CM

## 2019-09-18 DIAGNOSIS — R2681 Unsteadiness on feet: Secondary | ICD-10-CM | POA: Diagnosis not present

## 2019-09-18 NOTE — Therapy (Signed)
[facility] [address] [place], [place], [address] Phone: [phone]   Fax:  [phone]  Physical Therapy Treatment  Patient Details  Name: Benjamin Hahn MRN: [id number] Date of Birth: Mar 17, 1933 Referring Provider (PT): Jovita Gamma   Encounter Date: 09/18/2019  PT End of Session - 09/18/19 1217    Visit Number  7    Number of Visits  18    Date for PT Re-Evaluation  11/13/19    Authorization Type  Medicare/BCBS-Will need 10th visit progress note    PT Start Time  0806    PT Stop Time  0848    PT Time Calculation (min)  42 min    Activity Tolerance  Patient tolerated treatment well    Behavior During Therapy  [facility] for tasks assessed/performed       Past Medical History:  Diagnosis Date  . Anxiety   . Arthritis    might be in back, no problems  . BPH (benign prostatic hyperplasia)   . Depression 1990s  . GERD (gastroesophageal reflux disease)    occasional  . Hypercholesteremia    controled  . Hypothyroid   . Shingles May 2014   "Right face, still has some"  . Temporal arteritis ([place])   . Transient blindness of both eyes     Past Surgical History:  Procedure Laterality Date  . ARTERY BIOPSY Right 06/20/2013   Procedure: BIOPSY TEMPORAL ARTERY RIGHT;  Surgeon: Earnstine Regal, MD;  Location: [facility];  Service: General;  Laterality: Right;  . CARDIAC CATHETERIZATION N/A 09/02/2015   Procedure: Left Heart Cath and Coronary Angiography;  Surgeon: Charolette Forward, MD;  Location: [facility];  Service: Cardiovascular;  Laterality: N/A;  . CARDIAC CATHETERIZATION N/A 09/02/2015   Procedure: Coronary Stent Intervention;  Surgeon: Charolette Forward, MD;  Location: [facility];  Service: Cardiovascular;  Laterality: N/A;  1.  mid RCA      (3.0/28mm Xience) 2.  Mid LAD      (3.0/23mm Xience)  . CRANIOTOMY N/A 04/19/2019   Procedure: CRANIOTOMY HEMATOMA EVACUATION SUBDURAL;  Surgeon: Jovita Gamma, MD;  Location: [facility];  Service: Neurosurgery;  Laterality: N/A;  CRANIOTOMY HEMATOMA EVACUATION SUBDURAL  . None      There were no vitals filed for this visit.  Subjective Assessment - 09/18/19 0808    Subjective  Been doing the stretches; think that is helping my walking.  No falls, no pain.  Having some difficulty with getting out of the car.  Did the seated elliptical at the gym yesterday, 30 minutes.    Patient Stated Goals  Pt's goals are to improve walking, balance, strength.  Would like to get back to driving.    Currently in Pain?  No/denies                Therapeutic Exercise -Reviewed exercises added to HEP last visit:  Seated hamstring stretch, 2 reps each leg, 30 seconds; standing stagger stance forward/back weightshifting for active ankle dorsiflexion stretch, x 10 reps each; alternating heel raises, 2 sets x 5 reps-cues for slowed technique. -Additional review of sit<>stand (see below, x 10 reps) -Seated stepping out/in, x 3 reps each side, 3 sets, simulating getting in and out of car (cues for stepping high/wide); performed at corner of mat        [facility] Adult PT Treatment/Exercise - 09/18/19 0816      Transfers   Transfers  Sit to Stand;Stand to Sit  Sit to Stand  5: Supervision;Without upper extremity assist;From chair/3-in-1    Five time sit to stand comments   11.37    Stand to Sit  5: Supervision;To chair/3-in-1;Without upper extremity assist    Transfer Cueing  Cued pt for foot placement, increased forward lean to initiate standing.  Performed additional 10 reps from mat height, sit<>stand with cues for maintaining upright posture (including looking ahead) x 3 seconds each time he stands.    Comments  1st attempt at 5x sit<>stand:  pt performs x 4 in 14.37 sec and then asks to stop      Ambulation/Gait   Ambulation/Gait  Yes    Ambulation/Gait Assistance  5: Supervision    Ambulation/Gait Assistance Details  Cues for equal, even step length,  heelstrike    Ambulation Distance (Feet)  80 Feet   x 2, 50 ft x 4   Assistive device  None    Gait Pattern  Step-through pattern;Decreased step length - right;Decreased step length - left;Narrow base of support;Trunk flexed;Shuffle    Ambulation Surface  Level;Indoor    Stairs  Yes    Stairs Assistance  5: Supervision    Stairs Assistance Details (indicate cue type and reason)  Cues for full foot placement on steps ascending.  Pt has near posterior LOB descending.    Stair Management Technique  One rail Right;Alternating pattern;Forwards    Number of Stairs  4   x 4 reps   Height of Stairs  6      Standardized Balance Assessment   Standardized Balance Assessment  Timed Up and Go Test;Berg Balance Test;Dynamic Gait Index      Berg Balance Test   Sit to Stand  Able to stand without using hands and stabilize independently    Standing Unsupported  Able to stand safely 2 minutes    Sitting with Back Unsupported but Feet Supported on Floor or Stool  Able to sit safely and securely 2 minutes    Stand to Sit  Sits safely with minimal use of hands    Transfers  Able to transfer safely, minor use of hands    Standing Unsupported with Eyes Closed  Able to stand 10 seconds safely    Standing Ubsupported with Feet Together  Able to place feet together independently and stand 1 minute safely    From Standing, Reach Forward with Outstretched Arm  Can reach confidently >25 cm (10")    From Standing Position, Pick up Object from Floor  Able to pick up shoe, needs supervision    From Standing Position, Turn to Look Behind Over each Shoulder  Looks behind from both sides and weight shifts well    Turn 360 Degrees  Able to turn 360 degrees safely but slowly    Standing Unsupported, Alternately Place Feet on Step/Stool  Able to complete 4 steps without aid or supervision    Standing Unsupported, One Foot in Front  Able to take small step independently and hold 30 seconds    Standing on One Leg  Tries to  lift leg/unable to hold 3 seconds but remains standing independently    Total Score  46      Dynamic Gait Index   Level Surface  Mild Impairment   8.5   Change in Gait Speed  Moderate Impairment    Gait with Horizontal Head Turns  Mild Impairment    Gait with Vertical Head Turns  Mild Impairment    Gait and Pivot Turn  Normal  Step Over Obstacle  Severe Impairment    Step Around Obstacles  Moderate Impairment    Steps  Mild Impairment    Total Score  13      Timed Up and Go Test   TUG  Normal TUG    Normal TUG (seconds)  13.96   13 sec; 12.12 sec (no device):  avg = 13.02 sec            PT Education - 09/18/19 1108    Education Details  Educated in progress towards goals, POC; fall risk per DGI and need to use RW still for long distance outdoor gait    Person(s) Educated  Patient    Methods  Explanation    Comprehension  Verbalized understanding;Need further instruction   After discussion about RW outdoors, he asks if he is ready to walk outdoors without walker      PT Short Term Goals - 09/18/19 0821      PT SHORT TERM GOAL #1   Title  Pt will be independent with HEP for improved strength, balance, and gait.  TARGET 09/22/2019 (date extended x 1 week, due to missing 1 week of therapy-scheduling concerns)    Baseline  Performing some, not all of HEP (09/18/2019)    Time  5    Period  Weeks    Status  Partially Met    Target Date  09/22/19      PT SHORT TERM GOAL #2   Title  Pt will improve TUG score to less than or equal to 15 seconds for decreased fall risk.    Baseline  13.02 sec    Time  5    Period  Weeks    Status  Achieved    Target Date  09/22/19      PT SHORT TERM GOAL #3   Title  Pt will improve Berg Balance score to at least 38/56 for decreased fall risk.    Baseline  46/56 09/18/2019    Time  5    Period  Weeks    Status  Achieved    Target Date  09/22/19      PT SHORT TERM GOAL #4   Title  Pt will improve 5x sit<>stand to less than or equal  to 12.5 seconds for decreased fall risk.    Baseline  11.97 sec, 2nd trial    Time  5    Period  Weeks    Status  Achieved    Target Date  09/22/19      PT SHORT TERM GOAL #5   Title  Pt will negotiate at least 12 steps, using one handrail with step through pattern, with supervision, for improved ability to return to [employer].    Time  5    Period  Weeks    Status  Achieved    Target Date  09/22/19      PT SHORT TERM GOAL #6   Title  Pt will verbalize understanding of fall prevention in home environment.    Time  5    Period  Weeks    Status  New    Target Date  09/22/19        PT Long Term Goals - 09/18/19 1218      PT LONG TERM GOAL #1   Title  Pt will be independent with progression of HEP for improved balance, strength, and gait.  For all LTGs:  TARGET extended x 1 week, due to missing 1  wk of appt (10/20/2019)    Time  9    Period  Weeks    Status  On-going      PT LONG TERM GOAL #2   Title  Pt will improve TUG score to less than or equal to 13.5 seconds for decreased fall risk.    Baseline  13.02 sec 09/18/2019    Time  9    Period  Weeks    Status  Achieved      PT LONG TERM GOAL #3   Title  Pt will improve Berg score to at least 45/56 for decreased fall risk.    Time  9    Period  Weeks    Status  Achieved      PT LONG TERM GOAL #4   Title  Pt will perform floor>stand transfer with UE support, with supervision, for improved fall recovery, floor>stand transfers.    Time  9    Period  Weeks    Status  New      PT LONG TERM GOAL #5   Title  Pt will verbalize plans for continued community fitness upon d/c from PT.    Time  9    Period  Weeks    Status  New      Additional Long Term Goals   Additional Long Term Goals  Yes      PT LONG TERM GOAL #6   Title  Pt will improve DGI to at least 19/24 for decreased fall risk    Baseline  DGI 13/24 09/18/2019    Time  4    Period  Weeks    Status  New            Plan - 09/18/19 1226     Clinical Impression Statement  Assessed STGs this visit, with pt meeting 4 STGs.  Pt has met STG 2-5; he has partially met STG 1 for HEP.  He has improved TUG, 5x sit<> stand, and Berg Balance measures.  DGI assessed this visit, with DGI score 13/24 indicative of fall risk.  Discussed that use of RW for long distance, outdoor surfaces may still be appropriate due to pt's fall risk per DGI.  Pt will continue to benefit from skilled PT to address strength, balance, and gait towards LTGs for improved functional moiblity and decreased fall risk.    Personal Factors and Comorbidities  Comorbidity 2;Behavior Pattern   hx of falls   Comorbidities  HTN, hyperlipidemia    Examination-Activity Limitations  Locomotion Level;Transfers;Squat;Stairs;Stand;Other    Examination-Participation Restrictions  Community Activity;Driving;Other   walking in neighborhood, exercise at fitness center   Stability/Clinical Decision Making  Evolving/Moderate complexity    PT Frequency  2x / week    PT Duration  Other (comment)   9 weeks, including eval week   PT Treatment/Interventions  ADLs/Self Care Home Management;Gait training;Stair training;Functional mobility training;Therapeutic activities;Therapeutic exercise;Balance training;Neuromuscular re-education;Patient/family education    PT Next Visit Plan  Check STG for fall prevention; work on gait, step length, balance, as well as low surface sit<>stand (simulating car transfer)    PT Home Exercise Plan  Access Code: [email or web address] and Access Code: [email or web address]    Consulted and Agree with Plan of Care  Patient       Patient will benefit from skilled therapeutic intervention in order to improve the following deficits and impairments:  Abnormal gait, Difficulty walking, Decreased activity tolerance, Decreased balance, Decreased mobility, Decreased strength, Postural dysfunction  Visit Diagnosis: Muscle weakness (generalized)  Other abnormalities of gait and  mobility     Problem List Patient Active Problem List   Diagnosis Date Noted  . Deficiency anemia 06/01/2019  . Vitamin D deficiency 06/01/2019  . Chronic idiopathic constipation 06/01/2019  . Traumatic subdural hematoma ([place]) 04/21/2019  . Hyperglycemia 04/15/2019  . Mild intermittent asthma with acute exacerbation 02/08/2018  . Hypothyroidism 03/14/2013  . Hyperlipidemia LDL goal <70 03/14/2013  . BPH (benign prostatic hypertrophy) 03/14/2013  . Hypogonadism, male 03/14/2013  . Erectile dysfunction 03/14/2013    Frazier Butt. 09/18/2019, 1:57 PM  Frazier Butt., PT  [facility] [address] [place], [place], [address] Phone: [phone]   Fax:  [phone]  Name: Benjamin Hahn MRN: [id number] Date of Birth: 30-Aug-1932

## 2019-09-21 ENCOUNTER — Other Ambulatory Visit: Payer: Self-pay

## 2019-09-21 ENCOUNTER — Encounter: Payer: Self-pay | Admitting: Physical Therapy

## 2019-09-21 ENCOUNTER — Ambulatory Visit: Payer: Medicare Other | Admitting: Physical Therapy

## 2019-09-21 DIAGNOSIS — M6281 Muscle weakness (generalized): Secondary | ICD-10-CM | POA: Diagnosis not present

## 2019-09-21 DIAGNOSIS — R2689 Other abnormalities of gait and mobility: Secondary | ICD-10-CM

## 2019-09-21 DIAGNOSIS — R2681 Unsteadiness on feet: Secondary | ICD-10-CM | POA: Diagnosis not present

## 2019-09-21 NOTE — Therapy (Signed)
[facility] [address] [place], [place], [address] Phone: [phone]   Fax:  [phone]  Physical Therapy Treatment  Patient Details  Name: Benjamin Hahn MRN: [id number] Date of Birth: 07/30/1933 Referring Provider (PT): Jovita Gamma   Encounter Date: 09/21/2019  PT End of Session - 09/21/19 1114    Visit Number  8    Number of Visits  18    Date for PT Re-Evaluation  11/13/19    Authorization Type  Medicare/BCBS-Will need 10th visit progress note    PT Start Time  0848    PT Stop Time  0929    PT Time Calculation (min)  41 min    Activity Tolerance  Patient tolerated treatment well    Behavior During Therapy  [facility] for tasks assessed/performed       Past Medical History:  Diagnosis Date  . Anxiety   . Arthritis    might be in back, no problems  . BPH (benign prostatic hyperplasia)   . Depression 1990s  . GERD (gastroesophageal reflux disease)    occasional  . Hypercholesteremia    controled  . Hypothyroid   . Shingles May 2014   "Right face, still has some"  . Temporal arteritis ([place])   . Transient blindness of both eyes     Past Surgical History:  Procedure Laterality Date  . ARTERY BIOPSY Right 06/20/2013   Procedure: BIOPSY TEMPORAL ARTERY RIGHT;  Surgeon: Earnstine Regal, MD;  Location: [facility];  Service: General;  Laterality: Right;  . CARDIAC CATHETERIZATION N/A 09/02/2015   Procedure: Left Heart Cath and Coronary Angiography;  Surgeon: Charolette Forward, MD;  Location: [facility];  Service: Cardiovascular;  Laterality: N/A;  . CARDIAC CATHETERIZATION N/A 09/02/2015   Procedure: Coronary Stent Intervention;  Surgeon: Charolette Forward, MD;  Location: [facility];  Service: Cardiovascular;  Laterality: N/A;  1.  mid RCA      (3.0/28mm Xience) 2.  Mid LAD      (3.0/23mm Xience)  . CRANIOTOMY N/A 04/19/2019   Procedure: CRANIOTOMY HEMATOMA EVACUATION SUBDURAL;  Surgeon: Jovita Gamma, MD;  Location: [facility];  Service: Neurosurgery;  Laterality: N/A;  CRANIOTOMY HEMATOMA EVACUATION SUBDURAL  . None      There were no vitals filed for this visit.  Subjective Assessment - 09/21/19 0850    Subjective  Balance feels a little off today; didn't sleep very well last night.  Want to work on stretching and walking.    Patient Stated Goals  Pt's goals are to improve walking, balance, strength.  Would like to get back to driving.    Currently in Pain?  No/denies                       [facility] Adult PT Treatment/Exercise - 09/21/19 0001      Ambulation/Gait   Ambulation/Gait  Yes    Ambulation/Gait Assistance  5: Supervision    Ambulation/Gait Assistance Details  Cues to slow pace of gait-increase step length, heelstrike, arm swing, posture     Ambulation Distance (Feet)  100 Feet   120 ft x 2; 20 ft x 2   Assistive device  None    Gait Pattern  Step-through pattern;Decreased step length - right;Decreased step length - left;Narrow base of support;Trunk flexed;Shuffle    Ambulation Surface  Level;Indoor    Gait Comments  With cues to slow pace, pt is able to improve gait pattern, but he  revert back to narrow BOS, shuffle-type steps with decreased step length/foot clearance today.        Knee/Hip Exercises: Stretches   Active Hamstring Stretch  Right;Left;3 reps;30 seconds    Active Hamstring Stretch Limitations  Performed at edge of mat, with pt return demo understanding    Other Knee/Hip Stretches  Runner's stretch position, 2 reps each leg x 15 seconds, extensive cues for technique    Other Knee/Hip Stretches  Stagger stance forward/back rocking for dynamic ankle dorsiflexion stretch, x 15 reps, cues for technique for optimal stretch.        Knee/Hip Exercises: Aerobic   Stepper  SciFit Stepper, Level 1.5, 4 extremities, x 5 minutes for warm up through BLEs.      Ankle Exercises: Standing   Other Standing Ankle Exercises  Heel/toe raises x 10 reps, 2 sets           Balance Exercises - 09/21/19 0918      Balance Exercises: Standing   Stepping Strategy  Anterior;Posterior;Lateral;UE support;10 reps   Fwd and side directions stepping over obstacle   Retro Gait  Upper extremity support;5 reps   Fwd/back walking in parallel bars-cues for step length   Other Standing Exercises  Wide BOS lateral weigthshifting x 10 reps, cues for slowed pace.      In parallel bars with balance exercises, PT provides cues for patient to use visual cues of mirror to widen BOS and for improved posture.  Pt needs continued cues throughout for upright posture, to look ahead and avoid looking at ground.  Little carryover of postural awareness noted throughout session.  PT Education - 09/21/19 1113    Education Details  Discussed fall prevention education; reiterated importance of use of RW for longer distances outdoors and potential for trial use of cane next visit    Person(s) Educated  Patient    Methods  Explanation;Handout    Comprehension  Verbalized understanding       PT Short Term Goals - 09/18/19 2536      PT SHORT TERM GOAL #1   Title  Pt will be independent with HEP for improved strength, balance, and gait.  TARGET 09/22/2019 (date extended x 1 week, due to missing 1 week of therapy-scheduling concerns)    Baseline  Performing some, not all of HEP (09/18/2019)    Time  5    Period  Weeks    Status  Partially Met    Target Date  09/22/19      PT SHORT TERM GOAL #2   Title  Pt will improve TUG score to less than or equal to 15 seconds for decreased fall risk.    Baseline  13.02 sec    Time  5    Period  Weeks    Status  Achieved    Target Date  09/22/19      PT SHORT TERM GOAL #3   Title  Pt will improve Berg Balance score to at least 38/56 for decreased fall risk.    Baseline  46/56 09/18/2019    Time  5    Period  Weeks    Status  Achieved    Target Date  09/22/19      PT SHORT TERM GOAL #4   Title  Pt will improve 5x sit<>stand to less  than or equal to 12.5 seconds for decreased fall risk.    Baseline  11.97 sec, 2nd trial    Time  5    Period  Weeks    Status  Achieved    Target Date  09/22/19      PT SHORT TERM GOAL #5   Title  Pt will negotiate at least 12 steps, using one handrail with step through pattern, with supervision, for improved ability to return to [employer].    Time  5    Period  Weeks    Status  Achieved    Target Date  09/22/19      PT SHORT TERM GOAL #6   Title  Pt will verbalize understanding of fall prevention in home environment.    Time  5    Period  Weeks    Status  New    Target Date  09/22/19        PT Long Term Goals - 09/18/19 1218      PT LONG TERM GOAL #1   Title  Pt will be independent with progression of HEP for improved balance, strength, and gait.  For all LTGs:  TARGET extended x 1 week, due to missing 1 wk of appt (10/20/2019)    Time  9    Period  Weeks    Status  On-going      PT LONG TERM GOAL #2   Title  Pt will improve TUG score to less than or equal to 13.5 seconds for decreased fall risk.    Baseline  13.02 sec 09/18/2019    Time  9    Period  Weeks    Status  Achieved      PT LONG TERM GOAL #3   Title  Pt will improve Berg score to at least 45/56 for decreased fall risk.    Time  9    Period  Weeks    Status  Achieved      PT LONG TERM GOAL #4   Title  Pt will perform floor>stand transfer with UE support, with supervision, for improved fall recovery, floor>stand transfers.    Time  9    Period  Weeks    Status  New      PT LONG TERM GOAL #5   Title  Pt will verbalize plans for continued community fitness upon d/c from PT.    Time  9    Period  Weeks    Status  New      Additional Long Term Goals   Additional Long Term Goals  Yes      PT LONG TERM GOAL #6   Title  Pt will improve DGI to at least 19/24 for decreased fall risk    Baseline  DGI 13/24 09/18/2019    Time  4    Period  Weeks    Status  New            Plan -  09/21/19 1115    Clinical Impression Statement  Pt has met STG for fall prevention this visit.  He feels stretches are helping with gait, but he continues to need cues in session for correct technique.  With stepping exercises in parallel bars today, PT cues patient for improved weightshifting, as pt tends to speed up through set of stepping exercises.  Pt will continue to benefit from skilled PT to address balance, strength, and gait towards LTGs.    Personal Factors and Comorbidities  Comorbidity 2;Behavior Pattern   hx of falls   Comorbidities  HTN, hyperlipidemia    Examination-Activity Limitations  Locomotion Level;Transfers;Squat;Stairs;Stand;Other    Examination-Participation Restrictions  Community Activity;Driving;Other  walking in neighborhood, exercise at fitness center   Stability/Clinical Decision Making  Evolving/Moderate complexity    PT Frequency  2x / week    PT Duration  Other (comment)   9 weeks, including eval week   PT Treatment/Interventions  ADLs/Self Care Home Management;Gait training;Stair training;Functional mobility training;Therapeutic activities;Therapeutic exercise;Balance training;Neuromuscular re-education;Patient/family education    PT Next Visit Plan  Try gait with cane to help with step length, pace, foot clearance, low surface sit<>stand; continue to work on exercises to improve step length, foot clearance, SLS and widened BOS    PT Home Exercise Plan  Access Code: [email or web address] and Access Code: [email or web address]    Consulted and Agree with Plan of Care  Patient       Patient will benefit from skilled therapeutic intervention in order to improve the following deficits and impairments:  Abnormal gait, Difficulty walking, Decreased activity tolerance, Decreased balance, Decreased mobility, Decreased strength, Postural dysfunction  Visit Diagnosis: Muscle weakness (generalized)  Unsteadiness on feet  Other abnormalities of gait and mobility     Problem List Patient  Active Problem List   Diagnosis Date Noted  . Deficiency anemia 06/01/2019  . Vitamin D deficiency 06/01/2019  . Chronic idiopathic constipation 06/01/2019  . Traumatic subdural hematoma ([place]) 04/21/2019  . Hyperglycemia 04/15/2019  . Mild intermittent asthma with acute exacerbation 02/08/2018  . Hypothyroidism 03/14/2013  . Hyperlipidemia LDL goal <70 03/14/2013  . BPH (benign prostatic hypertrophy) 03/14/2013  . Hypogonadism, male 03/14/2013  . Erectile dysfunction 03/14/2013    Frazier Butt. 09/21/2019, 11:18 AM  Frazier Butt., PT   [facility] [address] [place], [place], [address] Phone: [phone]   Fax:  [phone]  Name: Benjamin Hahn MRN: [id number] Date of Birth: [birth year]

## 2019-09-21 NOTE — Patient Instructions (Signed)

## 2019-09-25 ENCOUNTER — Ambulatory Visit: Payer: Medicare Other | Attending: Physician Assistant | Admitting: Physical Therapy

## 2019-09-25 ENCOUNTER — Other Ambulatory Visit: Payer: Self-pay

## 2019-09-25 DIAGNOSIS — R2681 Unsteadiness on feet: Secondary | ICD-10-CM | POA: Insufficient documentation

## 2019-09-25 DIAGNOSIS — R2689 Other abnormalities of gait and mobility: Secondary | ICD-10-CM | POA: Diagnosis not present

## 2019-09-25 DIAGNOSIS — M6281 Muscle weakness (generalized): Secondary | ICD-10-CM | POA: Insufficient documentation

## 2019-09-25 NOTE — Therapy (Signed)
[facility] [address] [place], [place], [address] Phone: [phone]   Fax:  [phone]  Physical Therapy Treatment  Patient Details  Name: Benjamin Hahn MRN: [id number] Date of Birth: 08-07-1933 Referring Provider (PT): Jovita Gamma   Encounter Date: 09/25/2019  PT End of Session - 09/25/19 1326    Visit Number  9    Number of Visits  18    Date for PT Re-Evaluation  11/13/19    Authorization Type  Medicare/BCBS-Will need 10th visit progress note    PT Start Time  0848    PT Stop Time  0931    PT Time Calculation (min)  43 min    Equipment Utilized During Treatment  Gait belt    Activity Tolerance  Patient tolerated treatment well    Behavior During Therapy  [facility] for tasks assessed/performed       Past Medical History:  Diagnosis Date  . Anxiety   . Arthritis    might be in back, no problems  . BPH (benign prostatic hyperplasia)   . Depression 1990s  . GERD (gastroesophageal reflux disease)    occasional  . Hypercholesteremia    controled  . Hypothyroid   . Shingles May 2014   "Right face, still has some"  . Temporal arteritis ([place])   . Transient blindness of both eyes     Past Surgical History:  Procedure Laterality Date  . ARTERY BIOPSY Right 06/20/2013   Procedure: BIOPSY TEMPORAL ARTERY RIGHT;  Surgeon: Earnstine Regal, MD;  Location: [facility];  Service: General;  Laterality: Right;  . CARDIAC CATHETERIZATION N/A 09/02/2015   Procedure: Left Heart Cath and Coronary Angiography;  Surgeon: Charolette Forward, MD;  Location: [place] [facility];  Service: Cardiovascular;  Laterality: N/A;  . CARDIAC CATHETERIZATION N/A 09/02/2015   Procedure: Coronary Stent Intervention;  Surgeon: Charolette Forward, MD;  Location: [place] [facility];  Service: Cardiovascular;  Laterality: N/A;  1.  mid RCA      (3.0/28mm Xience) 2.  Mid LAD      (3.0/23mm Xience)  . CRANIOTOMY N/A 04/19/2019   Procedure: CRANIOTOMY  HEMATOMA EVACUATION SUBDURAL;  Surgeon: Jovita Gamma, MD;  Location: [facility];  Service: Neurosurgery;  Laterality: N/A;  CRANIOTOMY HEMATOMA EVACUATION SUBDURAL  . None      There were no vitals filed for this visit.  Subjective Assessment - 09/25/19 0850    Subjective  Exercises going well.  Get off balance when I first stand up sometimes (noted in lobby upon being called back for PT appt).    Patient Stated Goals  Pt's goals are to improve walking, balance, strength.  Would like to get back to driving.    Currently in Pain?  No/denies                       [facility] Adult PT Treatment/Exercise - 09/25/19 0001      Transfers   Transfers  Sit to Stand;Stand to Sit    Sit to Stand  5: Supervision;Without upper extremity assist;From chair/3-in-1    Sit to Stand Details  Verbal cues for technique    Sit to Stand Details (indicate cue type and reason)  Cues for foot placement, increased forward lean, upright posture; cues to SLOW down pace of sit>stand transfers    Stand to Sit  5: Supervision;To chair/3-in-1;Without upper extremity assist    Number of Reps  10 reps;2 sets   from chair, from  mat     Ambulation/Gait   Ambulation/Gait  Yes    Ambulation/Gait Assistance  4: Min guard    Ambulation/Gait Assistance Details  Gait training with SPC, initial cues for sequence of cane; then cues for equal, even step length using cane.    Ambulation Distance (Feet)  345 Feet   230; addiitonal 20 ft x 4 reps   Assistive device  Straight cane    Gait Pattern  Step-through pattern;Decreased step length - right;Decreased step length - left;Narrow base of support;Trunk flexed;Shuffle    Ambulation Surface  Level;Indoor    Gait Comments  With cane, pt continues to need cues to increase step length bilaterally, but especially on RLE.  Cues for increased heelstrike as well.  Pt is able to improve for short distances, then reverts back to quicker, more shuffling steps.      Knee/Hip  Exercises: Standing   Hip Abduction  Stengthening;Right;Left;10 reps;Knee straight;2 sets   2nd set with red theraband   Hip Extension  Stengthening;Both;10 reps;Knee straight;Limitations;2 sets   2nd set red theraband   Functional Squat  10 reps;2 sets    Other Standing Knee Exercises  Resisted sidestepping, with red theraband, R and L at parallel bars      Ankle Exercises: Standing   Other Standing Ankle Exercises  Toe walking in parallel bars x 3 reps, Heel walking in parallel bars x 3 reps, cues for increased step length       Gait training, continued:   Gait training on treadmill, BUE support, 1>1.2 mph, with frequent, consistent cues for SLOWED pace for reaching feet forward to increase step length, increased foot clearance, increased heelstrike, x 4 minutes.  Pt requests to stop due to fatigue.      PT Education - 09/25/19 1325    Education Details  Provided patient with handout for sequence for sit<>stand technique; benefits and education in use of cane    Person(s) Educated  Patient    Methods  Explanation;Demonstration;Handout    Comprehension  Verbalized understanding;Returned demonstration;Verbal cues required;Need further instruction       PT Short Term Goals - 09/18/19 7262      PT SHORT TERM GOAL #1   Title  Pt will be independent with HEP for improved strength, balance, and gait.  TARGET 09/22/2019 (date extended x 1 week, due to missing 1 week of therapy-scheduling concerns)    Baseline  Performing some, not all of HEP (09/18/2019)    Time  5    Period  Weeks    Status  Partially Met    Target Date  09/22/19      PT SHORT TERM GOAL #2   Title  Pt will improve TUG score to less than or equal to 15 seconds for decreased fall risk.    Baseline  13.02 sec    Time  5    Period  Weeks    Status  Achieved    Target Date  09/22/19      PT SHORT TERM GOAL #3   Title  Pt will improve Berg Balance score to at least 38/56 for decreased fall risk.    Baseline  46/56  09/18/2019    Time  5    Period  Weeks    Status  Achieved    Target Date  09/22/19      PT SHORT TERM GOAL #4   Title  Pt will improve 5x sit<>stand to less than or equal to 12.5 seconds for decreased  fall risk.    Baseline  11.97 sec, 2nd trial    Time  5    Period  Weeks    Status  Achieved    Target Date  09/22/19      PT SHORT TERM GOAL #5   Title  Pt will negotiate at least 12 steps, using one handrail with step through pattern, with supervision, for improved ability to return to [employer].    Time  5    Period  Weeks    Status  Achieved    Target Date  09/22/19      PT SHORT TERM GOAL #6   Title  Pt will verbalize understanding of fall prevention in home environment.    Time  5    Period  Weeks    Status  New    Target Date  09/22/19        PT Long Term Goals - 09/18/19 1218      PT LONG TERM GOAL #1   Title  Pt will be independent with progression of HEP for improved balance, strength, and gait.  For all LTGs:  TARGET extended x 1 week, due to missing 1 wk of appt (10/20/2019)    Time  9    Period  Weeks    Status  On-going      PT LONG TERM GOAL #2   Title  Pt will improve TUG score to less than or equal to 13.5 seconds for decreased fall risk.    Baseline  13.02 sec 09/18/2019    Time  9    Period  Weeks    Status  Achieved      PT LONG TERM GOAL #3   Title  Pt will improve Berg score to at least 45/56 for decreased fall risk.    Time  9    Period  Weeks    Status  Achieved      PT LONG TERM GOAL #4   Title  Pt will perform floor>stand transfer with UE support, with supervision, for improved fall recovery, floor>stand transfers.    Time  9    Period  Weeks    Status  New      PT LONG TERM GOAL #5   Title  Pt will verbalize plans for continued community fitness upon d/c from PT.    Time  9    Period  Weeks    Status  New      Additional Long Term Goals   Additional Long Term Goals  Yes      PT LONG TERM GOAL #6   Title  Pt  will improve DGI to at least 19/24 for decreased fall risk    Baseline  DGI 13/24 09/18/2019    Time  4    Period  Weeks    Status  New            Plan - 09/25/19 1326    Clinical Impression Statement  Trial of cane during today's skilled PT session, with needing initial and ongoing cueing to help with correct cane sequence and increased step length, foot clearance.  Also utilized treadmill for improved step length and foot clearance.  With both, pt is able to start increasing foot clearance, heel strike, step length when cues are given; shortly after cues removed, pt reverts to quicker, shuffling-type steps with decreased foot clearance.  Pt doesn't feel he needs cane for balance, but pt demonstrates LOB upon standing in  waiting area.  Repeated cues throughout session to slow pace of functional mobility tasks to increase safety.    Personal Factors and Comorbidities  Comorbidity 2;Behavior Pattern   hx of falls   Comorbidities  HTN, hyperlipidemia    Examination-Activity Limitations  Locomotion Level;Transfers;Squat;Stairs;Stand;Other    Examination-Participation Restrictions  Community Activity;Driving;Other   walking in neighborhood, exercise at fitness center   Stability/Clinical Decision Making  Evolving/Moderate complexity    PT Frequency  2x / week    PT Duration  Other (comment)   9 weeks, including eval week   PT Treatment/Interventions  ADLs/Self Care Home Management;Gait training;Stair training;Functional mobility training;Therapeutic activities;Therapeutic exercise;Balance training;Neuromuscular re-education;Patient/family education    PT Next Visit Plan  Consider asking wife to come back into session to help with cues for improved carryover for transfers, gait; gait training with cane; next visit is 10th VISIT PROGRESS NOTE    PT Home Exercise Plan  Access Code: [email or web address] and Access Code: [email or web address]    Consulted and Agree with Plan of Care  Patient       Patient will benefit  from skilled therapeutic intervention in order to improve the following deficits and impairments:  Abnormal gait, Difficulty walking, Decreased activity tolerance, Decreased balance, Decreased mobility, Decreased strength, Postural dysfunction  Visit Diagnosis: Other abnormalities of gait and mobility  Unsteadiness on feet  Muscle weakness (generalized)     Problem List Patient Active Problem List   Diagnosis Date Noted  . Deficiency anemia 06/01/2019  . Vitamin D deficiency 06/01/2019  . Chronic idiopathic constipation 06/01/2019  . Traumatic subdural hematoma ([place]) 04/21/2019  . Hyperglycemia 04/15/2019  . Mild intermittent asthma with acute exacerbation 02/08/2018  . Hypothyroidism 03/14/2013  . Hyperlipidemia LDL goal <70 03/14/2013  . BPH (benign prostatic hypertrophy) 03/14/2013  . Hypogonadism, male 03/14/2013  . Erectile dysfunction 03/14/2013    Danniella Robben W. 09/25/2019, 1:31 PM  Frazier Butt., PT   [facility] [address] [place], [place], [address] Phone: [phone]   Fax:  [phone]  Name: Benjamin Hahn MRN: [id number] Date of Birth: 03/26/1933

## 2019-09-25 NOTE — Patient Instructions (Signed)
Sit to Stand Transfers:  1. Scoot out to the edge of the chair 2. Place your feet flat on the floor, shoulder width apart.  Make sure your feet are tucked just under your knees. 3. Lean forward (nose over toes) with momentum, and stand up tall with your best posture.  If you need to use your arms, use them as a quick boost up to stand. 4. If you are in a low or soft chair, you can lean back and then forward up to stand, in order to get more momentum. 5. Once you are standing, make sure you are looking ahead and standing tall.  To sit down:  1. Back up until you feel the chair behind your legs. 2. Bend at yourhips, reaching  Back for you chair, if needed, then slowly squat to sit down on your chair.

## 2019-09-27 ENCOUNTER — Ambulatory Visit: Payer: Medicare Other | Attending: Internal Medicine

## 2019-09-27 DIAGNOSIS — Z23 Encounter for immunization: Secondary | ICD-10-CM | POA: Insufficient documentation

## 2019-09-27 NOTE — Progress Notes (Signed)
   Covid-19 Vaccination Clinic  Name:  Benjamin Hahn    MRN: [id number] DOB: 1932-10-06  09/27/2019  [name] was observed post Covid-19 immunization for 15 minutes without incidence. He was provided with Vaccine Information Sheet and instruction to access the V-Safe system.   [name] was instructed to call 911 with any severe reactions post vaccine: [name] Difficulty breathing  . Swelling of your face and throat  . A fast heartbeat  . A bad rash all over your body  . Dizziness and weakness    Immunizations Administered    Name Date Dose VIS Date Route   Pfizer COVID-19 Vaccine 09/27/2019  9:10 AM 0.3 mL 08/04/2019 Intramuscular   Manufacturer: [place]   Lot: [id number]   [place]: [id number]

## 2019-09-28 ENCOUNTER — Ambulatory Visit: Payer: Medicare Other | Admitting: Physical Therapy

## 2019-09-28 ENCOUNTER — Encounter: Payer: Self-pay | Admitting: Physical Therapy

## 2019-09-28 ENCOUNTER — Other Ambulatory Visit: Payer: Self-pay

## 2019-09-28 DIAGNOSIS — R2681 Unsteadiness on feet: Secondary | ICD-10-CM

## 2019-09-28 DIAGNOSIS — M6281 Muscle weakness (generalized): Secondary | ICD-10-CM | POA: Diagnosis not present

## 2019-09-28 DIAGNOSIS — R2689 Other abnormalities of gait and mobility: Secondary | ICD-10-CM | POA: Diagnosis not present

## 2019-09-28 NOTE — Therapy (Signed)
[facility] [address] [place], [place], [address] Phone: [phone]   Fax:  [phone]  Physical Therapy Treatment/10th Visit Progress Note  Patient Details  Name: Benjamin Hahn MRN: [id number] Date of Birth: 01-25-1933 Referring Provider (PT): Jovita Gamma   Encounter Date: 09/28/2019  PT End of Session - 09/28/19 1844    Visit Number  10    Number of Visits  18    Date for PT Re-Evaluation  11/13/19    Authorization Type  Medicare/BCBS-Will need 10th visit progress note    PT Start Time  0849    PT Stop Time  0932    PT Time Calculation (min)  43 min    Equipment Utilized During Treatment  Gait belt    Activity Tolerance  Patient tolerated treatment well    Behavior During Therapy  [facility] for tasks assessed/performed       Past Medical History:  Diagnosis Date  . Anxiety   . Arthritis    might be in back, no problems  . BPH (benign prostatic hyperplasia)   . Depression 1990s  . GERD (gastroesophageal reflux disease)    occasional  . Hypercholesteremia    controled  . Hypothyroid   . Shingles May 2014   "Right face, still has some"  . Temporal arteritis ([place])   . Transient blindness of both eyes     Past Surgical History:  Procedure Laterality Date  . ARTERY BIOPSY Right 06/20/2013   Procedure: BIOPSY TEMPORAL ARTERY RIGHT;  Surgeon: Earnstine Regal, MD;  [facility]: [facility];  Service: General;  Laterality: Right;  . CARDIAC CATHETERIZATION N/A 09/02/2015   Procedure: Left Heart Cath and Coronary Angiography;  Surgeon: Charolette Forward, MD;  Location: [place] [facility];  Service: Cardiovascular;  Laterality: N/A;  . CARDIAC CATHETERIZATION N/A 09/02/2015   Procedure: Coronary Stent Intervention;  Surgeon: Charolette Forward, MD;  Location: [place] [facility];  Service: Cardiovascular;  Laterality: N/A;  1.  mid RCA      (3.0/28mm Xience) 2.  Mid LAD      (3.0/23mm Xience)  . CRANIOTOMY N/A 04/19/2019   Procedure: CRANIOTOMY HEMATOMA EVACUATION SUBDURAL;  Surgeon: Jovita Gamma, MD;  Location: [facility];  Service: Neurosurgery;  Laterality: N/A;  CRANIOTOMY HEMATOMA EVACUATION SUBDURAL  . None      There were no vitals filed for this visit.  Subjective Assessment - 09/28/19 0851    Subjective  Been paying attention to my walking, my strides are longer and I'm walking better.    Patient Stated Goals  Pt's goals are to improve walking, balance, strength.  Would like to get back to driving.    Currently in Pain?  No/denies                       [facility] Adult PT Treatment/Exercise - 09/28/19 0853      Transfers   Transfers  Sit to Stand;Stand to Sit    Sit to Stand  5: Supervision;Without upper extremity assist;From chair/3-in-1    Five time sit to stand comments   13.87   10.44 sec   Stand to Sit  5: Supervision;To chair/3-in-1;Without upper extremity assist    Number of Reps  Other reps (comment)   5 reps, 3 sets   Comments  1st set of 5 reps:  pt has decreased forward lean, having to sit back down x 2.  With cues and repetition, he improves forward lean to initiate  standing.      Ambulation/Gait   Ambulation/Gait  Yes    Ambulation/Gait Assistance  4: Min guard    Ambulation/Gait Assistance Details  With cane, pt tends to take more of a stepto pattern, with improved LLE step length, but decreased RLE step length, with cues for equal, even step length.  Trialed bilateral walking poles to facilitate reciprocal arm swing and increased step length.  Once poles removed, he is able to maintain improved step length.    Ambulation Distance (Feet)  345 Feet   345 ft with cane, 345 ft with walking poles   Assistive device  Straight cane    Gait Pattern  Step-through pattern;Decreased step length - right;Decreased step length - left;Narrow base of support;Trunk flexed;Shuffle    Ambulation Surface  Level;Indoor    Gait velocity  11.32 sec = 2.9 ft/sec    Gait Comments  Dynamic  gait activities:  head turns, head nods, quick turns (x1 LOB), speed up/slow down      Timed Up and Go Test   TUG  Normal TUG;Cognitive TUG    Normal TUG (seconds)  13.97   11.19, 10.84 (Avg:  12 sec)   Cognitive TUG (seconds)  12.06          Balance Exercises - 09/28/19 1841      Balance Exercises: Standing   Retro Gait  Upper extremity support;5 reps   Fwd/back at counter, cues to slow pace   Sidestepping  Upper extremity support;3 reps   Cues for increased foot clearance, slowed pace   Other Standing Exercises  Alternating step taps to 4" cabinet step, cues for slowed pace and improved foot clearance, x 10 reps.  Progressed to alternating step taps to 6" cones, x 10 reps, then double-tap to cones x 5 reps-for improved SLS balance and control with SLS.          PT Short Term Goals - 09/18/19 9628      PT SHORT TERM GOAL #1   Title  Pt will be independent with HEP for improved strength, balance, and gait.  TARGET 09/22/2019 (date extended x 1 week, due to missing 1 week of therapy-scheduling concerns)    Baseline  Performing some, not all of HEP (09/18/2019)    Time  5    Period  Weeks    Status  Partially Met    Target Date  09/22/19      PT SHORT TERM GOAL #2   Title  Pt will improve TUG score to less than or equal to 15 seconds for decreased fall risk.    Baseline  13.02 sec    Time  5    Period  Weeks    Status  Achieved    Target Date  09/22/19      PT SHORT TERM GOAL #3   Title  Pt will improve Berg Balance score to at least 38/56 for decreased fall risk.    Baseline  46/56 09/18/2019    Time  5    Period  Weeks    Status  Achieved    Target Date  09/22/19      PT SHORT TERM GOAL #4   Title  Pt will improve 5x sit<>stand to less than or equal to 12.5 seconds for decreased fall risk.    Baseline  11.97 sec, 2nd trial    Time  5    Period  Weeks    Status  Achieved    Target Date  09/22/19  PT SHORT TERM GOAL #5   Title  Pt will negotiate at least  12 steps, using one handrail with step through pattern, with supervision, for improved ability to return to [employer].    Time  5    Period  Weeks    Status  Achieved    Target Date  09/22/19      PT SHORT TERM GOAL #6   Title  Pt will verbalize understanding of fall prevention in home environment.    Time  5    Period  Weeks    Status  New    Target Date  09/22/19        PT Long Term Goals - 09/18/19 1218      PT LONG TERM GOAL #1   Title  Pt will be independent with progression of HEP for improved balance, strength, and gait.  For all LTGs:  TARGET extended x 1 week, due to missing 1 wk of appt (10/20/2019)    Time  9    Period  Weeks    Status  On-going      PT LONG TERM GOAL #2   Title  Pt will improve TUG score to less than or equal to 13.5 seconds for decreased fall risk.    Baseline  13.02 sec 09/18/2019    Time  9    Period  Weeks    Status  Achieved      PT LONG TERM GOAL #3   Title  Pt will improve Berg score to at least 45/56 for decreased fall risk.    Time  9    Period  Weeks    Status  Achieved      PT LONG TERM GOAL #4   Title  Pt will perform floor>stand transfer with UE support, with supervision, for improved fall recovery, floor>stand transfers.    Time  9    Period  Weeks    Status  New      PT LONG TERM GOAL #5   Title  Pt will verbalize plans for continued community fitness upon d/c from PT.    Time  9    Period  Weeks    Status  New      Additional Long Term Goals   Additional Long Term Goals  Yes      PT LONG TERM GOAL #6   Title  Pt will improve DGI to at least 19/24 for decreased fall risk    Baseline  DGI 13/24 09/18/2019    Time  4    Period  Weeks    Status  New            Plan - 09/28/19 1845    Clinical Impression Statement  10th Visit Progress Note, covering dates 08/15/2019-09/28/2019:  Pt reports he is feeling better; he has been to the gym at [place] on the weekends to use the stepper.  He is not  using assistive device.  Objective measures:  5x sit<>stand:  13.87 sec, 10.44 sec at best (with decreased forward lean); gait velocity 2.9 ft/sec (improved from 2.16 ft/sec; Berg 46/56 (improved from 33/56), DGI 13/24.  Pt has improved on functional mboility measures; however, he remains at fall risk per DGI score.  With transfer training and with gait training, he improves form with repetition and cues; however, at times, when cues removed and with unexpected challenges, he demonstrates smaller movement patterns, difficulty with transfers (near posterior LOB), and shuffling with gait.  He has met 4 of 5 STGs, and met 2 LTGs (TUG and Berg score).  He is progressing towards LTGs and would continue to benefit from skilled PT to further address strength, balance, and gait for overall improved funcitonal mobility and decreased fall risk.    Personal Factors and Comorbidities  Comorbidity 2;Behavior Pattern   hx of falls   Comorbidities  HTN, hyperlipidemia    Examination-Activity Limitations  Locomotion Level;Transfers;Squat;Stairs;Stand;Other    Examination-Participation Restrictions  Community Activity;Driving;Other   walking in neighborhood, exercise at fitness center   Stability/Clinical Decision Making  Evolving/Moderate complexity    PT Frequency  2x / week    PT Duration  Other (comment)   9 weeks, including eval week   PT Treatment/Interventions  ADLs/Self Care Home Management;Gait training;Stair training;Functional mobility training;Therapeutic activities;Therapeutic exercise;Balance training;Neuromuscular re-education;Patient/family education    PT Next Visit Plan  Consider asking wife to come back into session to help with cues for improved carryover for transfers, gait; gait training with cane; dynamic gait and balance activities    PT Home Exercise Plan  Access Code: [email or web address] and Access Code: [email or web address]    Consulted and Agree with Plan of Care  Patient       Patient will benefit from  skilled therapeutic intervention in order to improve the following deficits and impairments:  Abnormal gait, Difficulty walking, Decreased activity tolerance, Decreased balance, Decreased mobility, Decreased strength, Postural dysfunction  Visit Diagnosis: Other abnormalities of gait and mobility  Unsteadiness on feet  Muscle weakness (generalized)     Problem List Patient Active Problem List   Diagnosis Date Noted  . Deficiency anemia 06/01/2019  . Vitamin D deficiency 06/01/2019  . Chronic idiopathic constipation 06/01/2019  . Traumatic subdural hematoma ([place]) 04/21/2019  . Hyperglycemia 04/15/2019  . Mild intermittent asthma with acute exacerbation 02/08/2018  . Hypothyroidism 03/14/2013  . Hyperlipidemia LDL goal <70 03/14/2013  . BPH (benign prostatic hypertrophy) 03/14/2013  . Hypogonadism, male 03/14/2013  . Erectile dysfunction 03/14/2013    Frazier Butt. 09/28/2019, 7:04 PM  Frazier Butt., PT   [facility] [address] [place], [place], [address] Phone: [phone]   Fax:  [phone]  Name: Benjamin Hahn MRN: [id number] Date of Birth: November 16, 1932

## 2019-09-29 DIAGNOSIS — Z9889 Other specified postprocedural states: Secondary | ICD-10-CM | POA: Diagnosis not present

## 2019-09-29 DIAGNOSIS — S065X9A Traumatic subdural hemorrhage with loss of consciousness of unspecified duration, initial encounter: Secondary | ICD-10-CM | POA: Diagnosis not present

## 2019-10-02 ENCOUNTER — Telehealth: Payer: Self-pay | Admitting: Physical Therapy

## 2019-10-02 ENCOUNTER — Ambulatory Visit: Payer: Medicare Other | Admitting: Physical Therapy

## 2019-10-02 ENCOUNTER — Other Ambulatory Visit: Payer: Self-pay

## 2019-10-02 DIAGNOSIS — R2689 Other abnormalities of gait and mobility: Secondary | ICD-10-CM | POA: Diagnosis not present

## 2019-10-02 DIAGNOSIS — R2681 Unsteadiness on feet: Secondary | ICD-10-CM | POA: Diagnosis not present

## 2019-10-02 DIAGNOSIS — M6281 Muscle weakness (generalized): Secondary | ICD-10-CM | POA: Diagnosis not present

## 2019-10-02 NOTE — Patient Instructions (Signed)
Local Driver Evaluation Programs: ° °Comprehensive Evaluation: includes clinical and in vehicle behind the wheel testing by OCCUPATIONAL THERAPIST. Programs have varying levels of adaptive controls available for trial.  ° °[facility] °[address] °[place], [place]  [address] °[phone] or [phone] °http://www.driver-rehab.com °Evaluator:  Cyndee Crompton, OT/CDRS/CDI/SCDCM/Low Vision Certification ° °[facility] °[address] °[place], [place] [address] °[phone] °https://www.[facility].org/home/services/rehabilitation.aspx °Evaluators:  Shannon Sheek, OT and Jill Tucker, OT ° °[facility] [place] [facility] (ONLY SERVES VETERANS!!) °[facility] °[address] °[place], [place]  [address] °[phone] °http://www.[place].va.gov/services/Physical_Medicine_Rehabilitation_Services.asp °Evaluators:  Eric Andrews, KT; Heidi Harris, KT;  Gary Whitaker, KT (KT=kiniesotherapist) ° ° °Clinical evaluations only:  Includes clinical testing, refers to other programs or local certified driving instructor for behind the wheel testing. ° °[facility] (outpatient Rehab) °[facility] °[address] °[place], [place] [address] °[phone] for scheduling °http://www.[facility].edu/Outpatient-Rehabilitation/Neurorehabilitation-Therapy.htm °Evaluators:  Kelly Lambeth, OT; Kate Phillips, OT ° °Other area clinical evaluators available upon request including [facility], [facility] and [facility]. ° ° °    Resource List °What is a Driver Evaluation: °Your Road Ahead - A Guide to Comprehensive Driving Evaluations °http://www.thehartford.com/resources/mature-market-excellence/publications-on-aging ° °Association for Driver Rehabilitation Services - Disability and Driving Fact Sheets °http://www.aded.net/?page=510 ° °Driving after a Brain  Injury: °Brain Injury Association of America °http://www.biausa.org/tbims-abstracts/if-there-is-an-effective-way-to-determine-if-someone-is-ready-to-drive-after-tbi?A=SearchResult&SearchID=9495675&ObjectID=2758842&ObjectType=35 ° °Driving with Adaptive Equipment: °[facility] Process °http://www.driver-rehab.com/adaptive-equipment ° °National Mobility Equipment [employer]ers Association °http://www.nmeda.com/ ° ° ° ° ° ° °  °

## 2019-10-02 NOTE — Telephone Encounter (Signed)
Dr. [name],  I received your fax today regarding Benjamin Hahn and a driving evaluation.  I know this has been a concern of his; however, we are not equipped at our facility to complete driving evaluations.  I provided Benjamin Hahn and his daughter today with a list of options in the community ([place] area) that offer driver evaluation services.  I encouraged them to follow-up if they would like the driving assessment completed.    I am including this list as well, for your reference.  (Please see below)  Thank you.  Mady Haagensen, PT 10/02/19 12:06 PM Phone: [phone] Fax: [phone]   Local Driver Evaluation Programs:  Comprehensive Evaluation: includes clinical and in vehicle behind the wheel testing by OCCUPATIONAL THERAPIST. Programs have varying levels of adaptive controls available for trial.   [employer] [place] [address] [place], [place]  [address] [phone] or [phone] http://www.driver-rehab.com Evaluator:  Richelle Ito, OT/CDRS/CDI/SCDCM/[facility] [address] [place], [place] [address] [phone] [email or web address] Evaluators:  Bertram Savin, OT and Mertie Clause, OT  [facility] [place] [facility] [place] [address] [place], [place]  [phone] http://www.[place].PremiumZip.com.br.asp Evaluators:  Bernadene Bell, KT; Heron Sabins, KT;  Shirlee Latch, KT (KT=kiniesotherapist)   Clinical evaluations only:  Includes clinical testing, refers to other programs or local certified driving instructor for behind the wheel testing.  [facility] (outpatient Rehab) [place] [address] [place], [place]  [address] [phone] for scheduling [email or web address] Evaluators:  Valentino Hue, OT; Haynes Hoehn, OT  Other area clinical evaluators available upon request including [facility], [facility] and [facility].       Resource List What is a Warden/ranger: Your Road Ahead - A Guide to [employer] Evaluations http://www.thehartford.com/resources/mature-market-excellence/publications-on-aging  Association for Musician - Disability and Driving Fact Sheets http://www.aded.net/?page=510  Driving after a Brain Injury: Brain Injury Association of America [place].be?A=SearchResult&SearchID=9495675&ObjectID=2758842&ObjectType=35  Driving with Adaptive Equipment: Chiropractor Association DebtRide.com.au

## 2019-10-02 NOTE — Therapy (Signed)
[facility] [address] [place], [place], [address] Phone: [phone]   Fax:  [phone]  Physical Therapy Treatment  Patient Details  Name: Benjamin Hahn MRN: [id number] Date of Birth: [birth year] Referring Provider (PT): Jovita Gamma   Encounter Date: 10/02/2019  PT End of Session - 10/02/19 1005    Visit Number  11    Number of Visits  18    Date for PT Re-Evaluation  11/13/19    Authorization Type  Medicare/BCBS-Will need 10th visit progress note    PT Start Time  0806   Pt arrives late   PT Stop Time  0849    PT Time Calculation (min)  43 min    Equipment Utilized During Treatment  Gait belt    Activity Tolerance  Patient tolerated treatment well;Patient limited by fatigue   Sitting breaks after approx 300 ft bouts of gait due to fatigue   Behavior During Therapy  [facility] for tasks assessed/performed       Past Medical History:  Diagnosis Date  . Anxiety   . Arthritis    might be in back, no problems  . BPH (benign prostatic hyperplasia)   . Depression 1990s  . GERD (gastroesophageal reflux disease)    occasional  . Hypercholesteremia    controled  . Hypothyroid   . Shingles May 2014   "Right face, still has some"  . Temporal arteritis ([place])   . Transient blindness of both eyes     Past Surgical History:  Procedure Laterality Date  . ARTERY BIOPSY Right 06/20/2013   Procedure: BIOPSY TEMPORAL ARTERY RIGHT;  Surgeon: Earnstine Regal, MD;  Location: [facility];  Service: General;  Laterality: Right;  . CARDIAC CATHETERIZATION N/A 09/02/2015   Procedure: Left Heart Cath and Coronary Angiography;  Surgeon: Charolette Forward, MD;  Location: [facility];  Service: Cardiovascular;  Laterality: N/A;  . CARDIAC CATHETERIZATION N/A 09/02/2015   Procedure: Coronary Stent Intervention;  Surgeon: Charolette Forward, MD;  Location: [facility];  Service: Cardiovascular;  Laterality: N/A;  1.  mid RCA  (3.0/28mm Xience) 2.  Mid LAD      (3.0/23mm Xience)  . CRANIOTOMY N/A 04/19/2019   Procedure: CRANIOTOMY HEMATOMA EVACUATION SUBDURAL;  Surgeon: Jovita Gamma, MD;  Location: [facility] [facility];  Service: Neurosurgery;  Laterality: N/A;  CRANIOTOMY HEMATOMA EVACUATION SUBDURAL  . None      There were no vitals filed for this visit.  Subjective Assessment - 10/02/19 0815    Subjective  Saw Dr. [name] and he was not pleased that I am not driving yet.  Otherwise, he was pleased with my progress.    Patient Stated Goals  Pt's goals are to improve walking, balance, strength.  Would like to get back to driving.    Currently in Pain?  No/denies                       [facility] Adult PT Treatment/Exercise - 10/02/19 0820      Transfers   Transfers  Sit to Stand;Stand to Sit    Sit to Stand  5: Supervision;Without upper extremity assist;From chair/3-in-1    Sit to Stand Details  Verbal cues for technique    Stand to Sit  5: Supervision;To chair/3-in-1;Without upper extremity assist    Number of Reps  10 reps;2 sets   from 18" chair, from 14"block/BOSU   Transfer Cueing  Cues for foot placement, cues for scooting to edge,  for forward lean    Comments  Additional 8 reps from 14"block with Airex cushion, min guard assist      Ambulation/Gait   Ambulation/Gait  Yes    Ambulation/Gait Assistance  4: Min guard;5: Supervision    Ambulation Distance (Feet)  295 Feet   then 330 ft, 230 ft   Assistive device  None    Gait Pattern  Step-through pattern;Decreased step length - right;Decreased step length - left;Narrow base of support;Trunk flexed;Shuffle    Ambulation Surface  Level;Indoor;Unlevel   Blue mat surface   Pre-Gait Activities  Gait activities including environmental scanning, conversation tasks, no device, pt reverts to shuffling pattern, decreased foot clearance; with cues, pt does not increase foot clearance or step length.  Pt fatigued after each bout of gait (approx 300 ft)     Gait Comments  Forward/back gait on blue mat surface near counter-cues for foot clearance.  Pt needs cues in posterior direction to hinge at hips and slow pace to maintain balance.  He has one LOB posteriorly, needing therapist assist to regain.  Marching forward on mat, to increase stance time/foot clearance      Self-Care   Self-Care  Other Self-Care Comments    Other Self-Care Comments   Pt comes into session, reporting that physician (Dr. [name]) was upset at his visit last week that patient isn't driving, that driving has not been addressed in therapy.  PT explained to patient that physical therapy's focus has been on balance, strength, gait training, and that we are not equipped at this clinic to address driving evaluations.  Explained process of driving evaluation and provided handout to patient with options for pt to f/u on driving evals.  Disuccsed with daughter at end of session, as well as continued need for supervision on outdoor surfaces and benefit of pt scheduling more appts (at least 2 more weeks in POC) to further address balance and gait.             PT Education - 10/02/19 1004    Education Details  See self-care and instructions    Person(s) Educated  Patient;Child(ren)   daughter, Sharee Pimple, at end of session   Methods  Explanation;Handout    Comprehension  Verbalized understanding       PT Short Term Goals - 09/18/19 0821      PT SHORT TERM GOAL #1   Title  Pt will be independent with HEP for improved strength, balance, and gait.  TARGET 09/22/2019 (date extended x 1 week, due to missing 1 week of therapy-scheduling concerns)    Baseline  Performing some, not all of HEP (09/18/2019)    Time  5    Period  Weeks    Status  Partially Met    Target Date  09/22/19      PT SHORT TERM GOAL #2   Title  Pt will improve TUG score to less than or equal to 15 seconds for decreased fall risk.    Baseline  13.02 sec    Time  5    Period  Weeks    Status  Achieved    Target  Date  09/22/19      PT SHORT TERM GOAL #3   Title  Pt will improve Berg Balance score to at least 38/56 for decreased fall risk.    Baseline  46/56 09/18/2019    Time  5    Period  Weeks    Status  Achieved    Target Date  09/22/19      PT SHORT TERM GOAL #4   Title  Pt will improve 5x sit<>stand to less than or equal to 12.5 seconds for decreased fall risk.    Baseline  11.97 sec, 2nd trial    Time  5    Period  Weeks    Status  Achieved    Target Date  09/22/19      PT SHORT TERM GOAL #5   Title  Pt will negotiate at least 12 steps, using one handrail with step through pattern, with supervision, for improved ability to return to [employer].    Time  5    Period  Weeks    Status  Achieved    Target Date  09/22/19      PT SHORT TERM GOAL #6   Title  Pt will verbalize understanding of fall prevention in home environment.    Time  5    Period  Weeks    Status  New    Target Date  09/22/19        PT Long Term Goals - 09/18/19 1218      PT LONG TERM GOAL #1   Title  Pt will be independent with progression of HEP for improved balance, strength, and gait.  For all LTGs:  TARGET extended x 1 week, due to missing 1 wk of appt (10/20/2019)    Time  9    Period  Weeks    Status  On-going      PT LONG TERM GOAL #2   Title  Pt will improve TUG score to less than or equal to 13.5 seconds for decreased fall risk.    Baseline  13.02 sec 09/18/2019    Time  9    Period  Weeks    Status  Achieved      PT LONG TERM GOAL #3   Title  Pt will improve Berg score to at least 45/56 for decreased fall risk.    Time  9    Period  Weeks    Status  Achieved      PT LONG TERM GOAL #4   Title  Pt will perform floor>stand transfer with UE support, with supervision, for improved fall recovery, floor>stand transfers.    Time  9    Period  Weeks    Status  New      PT LONG TERM GOAL #5   Title  Pt will verbalize plans for continued community fitness upon d/c from PT.     Time  9    Period  Weeks    Status  New      Additional Long Term Goals   Additional Long Term Goals  Yes      PT LONG TERM GOAL #6   Title  Pt will improve DGI to at least 19/24 for decreased fall risk    Baseline  DGI 13/24 09/18/2019    Time  4    Period  Weeks    Status  New            Plan - 10/02/19 1006    Clinical Impression Statement  Skilled PT session today addressed transfer training from low surfaces, per patient request, as well as gait training and education on driving evaluation process.  Gait training today performed without assistive device on compliant surfaces, change of directions, conversation and environmental scanning tasks.  With additional tasks added to gait, pt demonstrates decreased foot clearance, decreased  step length, with difficulty coming out of that pattern.  Pt will continue to benefit from skilled PT to further address functional strength, balance, and gait for improved functional mobility and decreased fall risk.  Pt feels he is ready to d/c at last scheduled appt later this week, but PT explained to pt and daughter benefit of additional PT weeks available in POC.    Personal Factors and Comorbidities  Comorbidity 2;Behavior Pattern   hx of falls   Comorbidities  HTN, hyperlipidemia    Examination-Activity Limitations  Locomotion Level;Transfers;Squat;Stairs;Stand;Other    Examination-Participation Restrictions  Community Activity;Driving;Other   walking in neighborhood, exercise at fitness center   Stability/Clinical Decision Making  Evolving/Moderate complexity    PT Frequency  2x / week    PT Duration  Other (comment)   9 weeks, including eval week   PT Treatment/Interventions  ADLs/Self Care Home Management;Gait training;Stair training;Functional mobility training;Therapeutic activities;Therapeutic exercise;Balance training;Neuromuscular re-education;Patient/family education    PT Next Visit Plan  Did pt decide to add further PT sessions?   Continue to work on gait training, balance; check LTGs and d/c if pt doesn't want to schedule additional appts    PT Home Exercise Plan  Access Code: [email or web address] and Access Code: [email or web address]    Consulted and Agree with Plan of Care  Patient;Family member/caregiver    Family Member Consulted  daughter, Sharee Pimple       Patient will benefit from skilled therapeutic intervention in order to improve the following deficits and impairments:  Abnormal gait, Difficulty walking, Decreased activity tolerance, Decreased balance, Decreased mobility, Decreased strength, Postural dysfunction  Visit Diagnosis: Muscle weakness (generalized)  Other abnormalities of gait and mobility  Unsteadiness on feet     Problem List Patient Active Problem List   Diagnosis Date Noted  . Deficiency anemia 06/01/2019  . Vitamin D deficiency 06/01/2019  . Chronic idiopathic constipation 06/01/2019  . Traumatic subdural hematoma ([place]) 04/21/2019  . Hyperglycemia 04/15/2019  . Mild intermittent asthma with acute exacerbation 02/08/2018  . Hypothyroidism 03/14/2013  . Hyperlipidemia LDL goal <70 03/14/2013  . BPH (benign prostatic hypertrophy) 03/14/2013  . Hypogonadism, male 03/14/2013  . Erectile dysfunction 03/14/2013    Frazier Butt. 10/02/2019, 10:11 AM  Frazier Butt., PT  [facility] [address] [place], [place], [address] Phone: [phone]   Fax:  [phone]  Name: NAVEED HUMPHRES MRN: [id number] Date of Birth: [birth year]

## 2019-10-03 ENCOUNTER — Other Ambulatory Visit: Payer: Self-pay | Admitting: Internal Medicine

## 2019-10-05 ENCOUNTER — Ambulatory Visit: Payer: Medicare Other | Admitting: Physical Therapy

## 2019-10-05 ENCOUNTER — Other Ambulatory Visit (HOSPITAL_COMMUNITY): Payer: Self-pay | Admitting: *Deleted

## 2019-10-05 ENCOUNTER — Other Ambulatory Visit: Payer: Self-pay

## 2019-10-05 DIAGNOSIS — R2689 Other abnormalities of gait and mobility: Secondary | ICD-10-CM | POA: Diagnosis not present

## 2019-10-05 DIAGNOSIS — M6281 Muscle weakness (generalized): Secondary | ICD-10-CM

## 2019-10-05 DIAGNOSIS — R2681 Unsteadiness on feet: Secondary | ICD-10-CM | POA: Diagnosis not present

## 2019-10-05 MED ORDER — BUPROPION HCL ER (XL) 300 MG PO TB24: 300.0000 mg | ORAL_TABLET | Freq: Every day | ORAL | 2 refills | Status: DC

## 2019-10-05 NOTE — Patient Instructions (Addendum)
Access Code: [email or web address] URL: [email or web address] Date: 10/05/2019 Prepared by: Mady Haagensen  Exercises Seated Hamstring Stretch with Strap - 3 sets - 60 seconds hold - 3x daily - 7x weekly Supine Hamstring Stretch with Strap - 3 sets - 60 seconds hold - 3x daily - 7x weekly Correct Standing Posture - 3 sets - 60 seconds hold - 2x daily - 7x weekly Staggered Stance Forward Backward Weight Shift with Counter Support - 10 reps - 1-2 sets - 1x daily - 5x weekly Alternating Heel Raises - 10 reps - 1-2 sets - 1x daily - 5x weekly Seated Hamstring Stretch - 3 reps - 1 sets - 30 sec hold - 2-3x daily - 7x weekly  Added to HEP: Mini Squat with Counter Support - 10 reps - 2 sets - 1x daily - 5x weekly March in Place - 10 reps - 2 sets - 1x daily - 5x weekly

## 2019-10-06 NOTE — Therapy (Signed)
[facility] [address] [place], [place], [address] Phone: [phone]   Fax:  [phone]  Physical Therapy Treatment  Patient Details  Name: Benjamin Hahn MRN: [id number] Date of Birth: 1932/09/22 Referring Provider (PT): Jovita Gamma   Encounter Date: 10/05/2019  PT End of Session - 10/05/19 2052    Visit Number  12    Number of Visits  18    Date for PT Re-Evaluation  11/13/19    Authorization Type  Medicare/BCBS-Will need 10th visit progress note    PT Start Time  0848    PT Stop Time  0934    PT Time Calculation (min)  46 min    Equipment Utilized During Treatment  Gait belt    Activity Tolerance  Patient tolerated treatment well    Behavior During Therapy  [facility] for tasks assessed/performed       Past Medical History:  Diagnosis Date  . Anxiety   . Arthritis    might be in back, no problems  . BPH (benign prostatic hyperplasia)   . Depression 1990s  . GERD (gastroesophageal reflux disease)    occasional  . Hypercholesteremia    controled  . Hypothyroid   . Shingles May 2014   "Right face, still has some"  . Temporal arteritis ([place])   . Transient blindness of both eyes     Past Surgical History:  Procedure Laterality Date  . ARTERY BIOPSY Right 06/20/2013   Procedure: BIOPSY TEMPORAL ARTERY RIGHT;  Surgeon: Earnstine Regal, MD;  Location: [facility];  Service: General;  Laterality: Right;  . CARDIAC CATHETERIZATION N/A 09/02/2015   Procedure: Left Heart Cath and Coronary Angiography;  Surgeon: Charolette Forward, MD;  Location: [place] [facility];  Service: Cardiovascular;  Laterality: N/A;  . CARDIAC CATHETERIZATION N/A 09/02/2015   Procedure: Coronary Stent Intervention;  Surgeon: Charolette Forward, MD;  Location: [place] [facility];  Service: Cardiovascular;  Laterality: N/A;  1.  mid RCA      (3.0/28mm Xience) 2.  Mid LAD      (3.0/23mm Xience)  . CRANIOTOMY N/A 04/19/2019   Procedure: CRANIOTOMY  HEMATOMA EVACUATION SUBDURAL;  Surgeon: Jovita Gamma, MD;  Location: [place] [place] [facility];  Service: Neurosurgery;  Laterality: N/A;  CRANIOTOMY HEMATOMA EVACUATION SUBDURAL  . None      There were no vitals filed for this visit.  Subjective Assessment - 10/05/19 0851    Subjective  Have not called about the driving information yet.  I don't think I need any more appointments, but my wife called to schedule more.  I just have trouble upon getting up and turning.    Patient Stated Goals  Pt's goals are to improve walking, balance, strength.  Would like to get back to driving.    Currently in Pain?  No/denies                       [facility] Adult PT Treatment/Exercise - 10/05/19 0858      Transfers   Transfers  Sit to Stand;Stand to Sit;Floor to Transfer    Sit to Stand  5: Supervision;Without upper extremity assist;From chair/3-in-1;From bed    Stand to Sit  5: Supervision;To chair/3-in-1;Without upper extremity assist;To bed    Floor to Transfer  4: Min guard;4: Min assist    Floor to Transfer Details (indicate cue type and reason)  Once pt gets onto mat on floor, he needs assistance to rock to get to hands and  knees position; then in tall kneeling, cues to rock to get to 1/2 kneel, then cues to rock and use UE support to stand.  Performed x 2 reps.    Number of Reps  Other reps (comment);2 sets   5 reps, 2 sets; one set standing on [employer]   Comments  Pt with decre ased posterior lean upon sit<>stand today.  Upon standing, widened BOS and lateral weightshifting x 5 reps for improved ease of initiating gait.      Ambulation/Gait   Ambulation/Gait  Yes    Ambulation/Gait Assistance  4: Min guard;5: Supervision    Ambulation/Gait Assistance Details  With short distance gait and turns:  worked on turns starting from static standing:  lateral weigthshfit, then lead with leg in the direction of turn; practiced at least 3 reps each direction.  Progressed to 30-35 ft of gait with turns to change  directions, cues to pick up feet to avoid pivto turning, repeated x 8 reps.    Ambulation Distance (Feet)  35 Feet   x 8, 80 ft x 2   Assistive device  None    Gait Pattern  Step-through pattern;Decreased step length - right;Decreased step length - left;Narrow base of support;Trunk flexed;Shuffle    Ambulation Surface  Level;Indoor      High Level Balance   High Level Balance Comments  Standing at counter, side step and weigthshifting x 10 reps each side, to incorporate side/diagonals for ease in initiating turns.      Knee/Hip Exercises: Standing   Forward Step Up  Right;Left;Step Height: 6";Hand Hold: 2;2 sets;10 reps   Single limb step up, then step up/up, down/down   Functional Squat  10 reps;2 sets    Other Standing Knee Exercises  Marching in place, 2 sets x 10 reps.  Cues for increased step height and SLS time.     Standing heel/toe raises, 2 sets x 10 reps for lower leg strengthening.  Cues for hold time/technique.        PT Education - 10/05/19 2052    Education Details  Additions to HEP for BLE strengthening    Person(s) Educated  Patient    Methods  Explanation;Demonstration;Handout;Verbal cues    Comprehension  Verbalized understanding;Returned demonstration;Verbal cues required       PT Short Term Goals - 09/18/19 1916      PT SHORT TERM GOAL #1   Title  Pt will be independent with HEP for improved strength, balance, and gait.  TARGET 09/22/2019 (date extended x 1 week, due to missing 1 week of therapy-scheduling concerns)    Baseline  Performing some, not all of HEP (09/18/2019)    Time  5    Period  Weeks    Status  Partially Met    Target Date  09/22/19      PT SHORT TERM GOAL #2   Title  Pt will improve TUG score to less than or equal to 15 seconds for decreased fall risk.    Baseline  13.02 sec    Time  5    Period  Weeks    Status  Achieved    Target Date  09/22/19      PT SHORT TERM GOAL #3   Title  Pt will improve Berg Balance score to at least  38/56 for decreased fall risk.    Baseline  46/56 09/18/2019    Time  5    Period  Weeks    Status  Achieved    Target  Date  09/22/19      PT SHORT TERM GOAL #4   Title  Pt will improve 5x sit<>stand to less than or equal to 12.5 seconds for decreased fall risk.    Baseline  11.97 sec, 2nd trial    Time  5    Period  Weeks    Status  Achieved    Target Date  09/22/19      PT SHORT TERM GOAL #5   Title  Pt will negotiate at least 12 steps, using one handrail with step through pattern, with supervision, for improved ability to return to [employer].    Time  5    Period  Weeks    Status  Achieved    Target Date  09/22/19      PT SHORT TERM GOAL #6   Title  Pt will verbalize understanding of fall prevention in home environment.    Time  5    Period  Weeks    Status  New    Target Date  09/22/19        PT Long Term Goals - 09/18/19 1218      PT LONG TERM GOAL #1   Title  Pt will be independent with progression of HEP for improved balance, strength, and gait.  For all LTGs:  TARGET extended x 1 week, due to missing 1 wk of appt (10/20/2019)    Time  9    Period  Weeks    Status  On-going      PT LONG TERM GOAL #2   Title  Pt will improve TUG score to less than or equal to 13.5 seconds for decreased fall risk.    Baseline  13.02 sec 09/18/2019    Time  9    Period  Weeks    Status  Achieved      PT LONG TERM GOAL #3   Title  Pt will improve Berg score to at least 45/56 for decreased fall risk.    Time  9    Period  Weeks    Status  Achieved      PT LONG TERM GOAL #4   Title  Pt will perform floor>stand transfer with UE support, with supervision, for improved fall recovery, floor>stand transfers.    Time  9    Period  Weeks    Status  New      PT LONG TERM GOAL #5   Title  Pt will verbalize plans for continued community fitness upon d/c from PT.    Time  9    Period  Weeks    Status  New      Additional Long Term Goals   Additional Long Term  Goals  Yes      PT LONG TERM GOAL #6   Title  Pt will improve DGI to at least 19/24 for decreased fall risk    Baseline  DGI 13/24 09/18/2019    Time  4    Period  Weeks    Status  New            Plan - 10/05/19 2053    Clinical Impression Statement  Pt reluctant to add more PT appointments, but agreeable as he states he wants to make sure to work on lower extremity strength to be able to get off floor (after working on floor>stand transfers today).  Also addressed turning techniques today, as pt reports feeling off-balance with initiation of turns upon standing.  Pt does demonstrate improved ease of transfers with decreased posterior lean today.  Pt will continue to beneift from skilled PT to further address gait, strength, and overall functional mobility.    Personal Factors and Comorbidities  Comorbidity 2;Behavior Pattern   hx of falls   Comorbidities  HTN, hyperlipidemia    Examination-Activity Limitations  Locomotion Level;Transfers;Squat;Stairs;Stand;Other    Examination-Participation Restrictions  Community Activity;Driving;Other   walking in neighborhood, exercise at fitness center   Stability/Clinical Decision Making  Evolving/Moderate complexity    PT Frequency  2x / week    PT Duration  Other (comment)   9 weeks, including eval week   PT Treatment/Interventions  ADLs/Self Care Home Management;Gait training;Stair training;Functional mobility training;Therapeutic activities;Therapeutic exercise;Balance training;Neuromuscular re-education;Patient/family education    PT Next Visit Plan  Review additions to HEP, work on floor>stand transfers, BLE strengthening, gait and balance    PT Home Exercise Plan  Access Code: [email or web address] and Access Code: [email or web address]    Consulted and Agree with Plan of Care  Patient;Family member/caregiver    Family Member Consulted  daughter, Jill-at end of session       Patient will benefit from skilled therapeutic intervention in order to improve the  following deficits and impairments:  Abnormal gait, Difficulty walking, Decreased activity tolerance, Decreased balance, Decreased mobility, Decreased strength, Postural dysfunction  Visit Diagnosis: Muscle weakness (generalized)  Other abnormalities of gait and mobility     Problem List Patient Active Problem List   Diagnosis Date Noted  . Deficiency anemia 06/01/2019  . Vitamin D deficiency 06/01/2019  . Chronic idiopathic constipation 06/01/2019  . Traumatic subdural hematoma ([place]) 04/21/2019  . Hyperglycemia 04/15/2019  . Mild intermittent asthma with acute exacerbation 02/08/2018  . Hypothyroidism 03/14/2013  . Hyperlipidemia LDL goal <70 03/14/2013  . BPH (benign prostatic hypertrophy) 03/14/2013  . Hypogonadism, male 03/14/2013  . Erectile dysfunction 03/14/2013    Benjamin Hahn W. 10/06/2019, 7:00 AM  Frazier Butt., PT   [facility] [address] [place], [place], [address] Phone: [phone]   Fax:  [phone]  Name: Benjamin Hahn MRN: [id number] Date of Birth: 1933-05-21

## 2019-10-11 ENCOUNTER — Emergency Department (HOSPITAL_COMMUNITY): Payer: Medicare Other

## 2019-10-11 ENCOUNTER — Encounter (HOSPITAL_COMMUNITY): Payer: Self-pay | Admitting: Emergency Medicine

## 2019-10-11 ENCOUNTER — Other Ambulatory Visit: Payer: Self-pay

## 2019-10-11 ENCOUNTER — Emergency Department (HOSPITAL_COMMUNITY)
Admission: EM | Admit: 2019-10-11 | Discharge: 2019-10-12 | Disposition: A | Discharge status: Home / Self Care | Payer: Medicare Other | Attending: Emergency Medicine | Admitting: Emergency Medicine

## 2019-10-11 DIAGNOSIS — W101XXA Fall (on)(from) sidewalk curb, initial encounter: Secondary | ICD-10-CM | POA: Diagnosis not present

## 2019-10-11 DIAGNOSIS — Y999 Unspecified external cause status: Secondary | ICD-10-CM | POA: Insufficient documentation

## 2019-10-11 DIAGNOSIS — R519 Headache, unspecified: Secondary | ICD-10-CM | POA: Diagnosis not present

## 2019-10-11 DIAGNOSIS — W19XXXA Unspecified fall, initial encounter: Secondary | ICD-10-CM | POA: Diagnosis not present

## 2019-10-11 DIAGNOSIS — Y929 Unspecified place or not applicable: Secondary | ICD-10-CM | POA: Diagnosis not present

## 2019-10-11 DIAGNOSIS — S01112A Laceration without foreign body of left eyelid and periocular area, initial encounter: Secondary | ICD-10-CM | POA: Diagnosis not present

## 2019-10-11 DIAGNOSIS — R9431 Abnormal electrocardiogram [ECG] [EKG]: Secondary | ICD-10-CM | POA: Diagnosis not present

## 2019-10-11 DIAGNOSIS — S060X9A Concussion with loss of consciousness of unspecified duration, initial encounter: Secondary | ICD-10-CM

## 2019-10-11 DIAGNOSIS — S0101XA Laceration without foreign body of scalp, initial encounter: Secondary | ICD-10-CM

## 2019-10-11 DIAGNOSIS — S0512XA Contusion of eyeball and orbital tissues, left eye, initial encounter: Secondary | ICD-10-CM | POA: Diagnosis not present

## 2019-10-11 DIAGNOSIS — S060X1A Concussion with loss of consciousness of 30 minutes or less, initial encounter: Secondary | ICD-10-CM | POA: Insufficient documentation

## 2019-10-11 DIAGNOSIS — R4020 Unspecified coma: Secondary | ICD-10-CM | POA: Diagnosis not present

## 2019-10-11 DIAGNOSIS — S199XXA Unspecified injury of neck, initial encounter: Secondary | ICD-10-CM | POA: Diagnosis not present

## 2019-10-11 DIAGNOSIS — Z87891 Personal history of nicotine dependence: Secondary | ICD-10-CM | POA: Insufficient documentation

## 2019-10-11 DIAGNOSIS — R110 Nausea: Secondary | ICD-10-CM | POA: Insufficient documentation

## 2019-10-11 DIAGNOSIS — S069X9A Unspecified intracranial injury with loss of consciousness of unspecified duration, initial encounter: Secondary | ICD-10-CM | POA: Diagnosis not present

## 2019-10-11 DIAGNOSIS — Y9301 Activity, walking, marching and hiking: Secondary | ICD-10-CM | POA: Insufficient documentation

## 2019-10-11 DIAGNOSIS — I252 Old myocardial infarction: Secondary | ICD-10-CM | POA: Diagnosis not present

## 2019-10-11 DIAGNOSIS — S0181XA Laceration without foreign body of other part of head, initial encounter: Secondary | ICD-10-CM | POA: Insufficient documentation

## 2019-10-11 DIAGNOSIS — R404 Transient alteration of awareness: Secondary | ICD-10-CM | POA: Diagnosis not present

## 2019-10-11 DIAGNOSIS — R58 Hemorrhage, not elsewhere classified: Secondary | ICD-10-CM | POA: Diagnosis not present

## 2019-10-11 HISTORY — DX: Acute myocardial infarction, unspecified: I21.9

## 2019-10-11 LAB — COMPREHENSIVE METABOLIC PANEL
ALT: 22 U/L (ref 0–44)
AST: 22 U/L (ref 15–41)
Albumin: 3.6 g/dL (ref 3.5–5.0)
Alkaline Phosphatase: 44 U/L (ref 38–126)
Anion gap: 11 (ref 5–15)
BUN: 37 mg/dL — ABNORMAL HIGH (ref 8–23)
CO2: 21 mmol/L — ABNORMAL LOW (ref 22–32)
Calcium: 8.6 mg/dL — ABNORMAL LOW (ref 8.9–10.3)
Chloride: 106 mmol/L (ref 98–111)
Creatinine, Ser: 1.41 mg/dL — ABNORMAL HIGH (ref 0.61–1.24)
GFR calc Af Amer: 53 mL/min — ABNORMAL LOW (ref >60)
GFR calc non Af Amer: 45 mL/min — ABNORMAL LOW (ref >60)
Glucose, Bld: 114 mg/dL — ABNORMAL HIGH (ref 70–99)
Potassium: 3.9 mmol/L (ref 3.5–5.1)
Sodium: 138 mmol/L (ref 135–145)
Total Bilirubin: 0.6 mg/dL (ref 0.3–1.2)
Total Protein: 6 g/dL — ABNORMAL LOW (ref 6.5–8.1)

## 2019-10-11 LAB — CBC
HCT: 35.2 % — ABNORMAL LOW (ref 39.0–52.0)
Hemoglobin: 11 g/dL — ABNORMAL LOW (ref 13.0–17.0)
MCH: 30.2 pg (ref 26.0–34.0)
MCHC: 31.3 g/dL (ref 30.0–36.0)
MCV: 96.7 fL (ref 80.0–100.0)
Platelets: 186 10*3/uL (ref 150–400)
RBC: 3.64 MIL/uL — ABNORMAL LOW (ref 4.22–5.81)
RDW: 12.8 % (ref 11.5–15.5)
WBC: 6.1 10*3/uL (ref 4.0–10.5)
nRBC: 0 % (ref 0.0–0.2)

## 2019-10-11 LAB — SAMPLE TO BLOOD BANK

## 2019-10-11 LAB — PROTIME-INR
INR: 1.1 (ref 0.8–1.2)
Prothrombin Time: 13.9 seconds (ref 11.4–15.2)

## 2019-10-11 LAB — I-STAT CHEM 8, ED
BUN: 36 mg/dL — ABNORMAL HIGH (ref 8–23)
Calcium, Ion: 1.08 mmol/L — ABNORMAL LOW (ref 1.15–1.40)
Chloride: 105 mmol/L (ref 98–111)
Creatinine, Ser: 1.5 mg/dL — ABNORMAL HIGH (ref 0.61–1.24)
Glucose, Bld: 109 mg/dL — ABNORMAL HIGH (ref 70–99)
HCT: 32 % — ABNORMAL LOW (ref 39.0–52.0)
Hemoglobin: 10.9 g/dL — ABNORMAL LOW (ref 13.0–17.0)
Potassium: 3.9 mmol/L (ref 3.5–5.1)
Sodium: 141 mmol/L (ref 135–145)
TCO2: 27 mmol/L (ref 22–32)

## 2019-10-11 LAB — CDS SEROLOGY

## 2019-10-11 LAB — LACTIC ACID, PLASMA: Lactic Acid, Venous: 1 mmol/L (ref 0.5–1.9)

## 2019-10-11 LAB — ETHANOL: Alcohol, Ethyl (B): <10 mg/dL (ref <10)

## 2019-10-11 MED ORDER — ONDANSETRON HCL 4 MG/2ML IJ SOLN
4.0000 mg | Freq: Once | INTRAMUSCULAR | Status: AC
  Administered 2019-10-11: 4 mg via INTRAVENOUS
  Filled 2019-10-11: qty 2

## 2019-10-11 MED ORDER — LIDOCAINE-EPINEPHRINE (PF) 2 %-1:200000 IJ SOLN
20.0000 mL | Freq: Once | INTRAMUSCULAR | Status: AC
  Administered 2019-10-11: 20 mL

## 2019-10-11 NOTE — Discharge Instructions (Signed)
You have 8 stitches in your head that will need to come out within 7 days, Tylenol or ibuprofen for pain, return to the ER for fevers vomiting seizures changes in vision, numbness or weakness.  If you develop increasing redness or drainage from the wound you should also return to the hospital.  Your CT scan shows no signs of bleeding on the brain but you have had a concussion, please read the attached instructions

## 2019-10-11 NOTE — ED Provider Notes (Signed)
[facility] EMERGENCY DEPARTMENT Provider Note   CSN: [id number] Arrival date & time: 10/11/19  1640     History No chief complaint on file.   Benjamin Hahn is a 86 y.o. male.  HPI   86 year old male, presents to the hospital after having a witnessed fall which occurred outside of a store, he fell from the curb into the street.  He reportedly struck his head on the ground and then had a 2-minute loss of consciousness.  There was bleeding from the wound on the left forehead and paramedics were called.  The patient complains of a headache and nausea, a dressing was placed in the field to control bleeding.  There is no vomiting or seizure activity.  This occurred just prior to arrival.  Symptoms are persistent, moderate, worsening, not associated with any weakness or numbness.  He was feeling in his usual state of health prior to the fall, he does not recall any of this.  Past Medical History:  Diagnosis Date  . MI (myocardial infarction) ([place])     There are no problems to display for this patient.   ** The histories are not reviewed yet. Please review them in the "History" navigator section and refresh this [place].     No family history on file.  Social History   Tobacco Use  . Smoking status: Former Research scientist (life sciences)  . Smokeless tobacco: Never Used  Substance Use Topics  . Alcohol use: Not Currently  . Drug use: Never    Home Medications Prior to Admission medications   Not on File    Allergies    Patient has no known allergies.  Review of Systems   Review of Systems  All other systems reviewed and are negative.   Physical Exam Updated Vital Signs BP 132/78   Pulse 68   Temp 97.7 F (36.5 C)   Resp 18   SpO2 98%   Physical Exam Vitals and nursing note reviewed.  Constitutional:      General: He is not in acute distress.    Appearance: He is well-developed.  HENT:     Head: Normocephalic.     Comments: 6 cm laceration involving the  left forehead extending from the left mid forehead down into the brow, slightly macerated tissue with irregular borders located at the brow level, explored without foreign body or muscular involvement    Mouth/Throat:     Pharynx: No oropharyngeal exudate.  Eyes:     General: No scleral icterus.       Right eye: No discharge.        Left eye: No discharge.     Conjunctiva/sclera: Conjunctivae normal.     Pupils: Pupils are equal, round, and reactive to light.  Neck:     Thyroid: No thyromegaly.     Vascular: No JVD.  Cardiovascular:     Rate and Rhythm: Normal rate and regular rhythm.     Heart sounds: Normal heart sounds. No murmur. No friction rub. No gallop.   Pulmonary:     Effort: Pulmonary effort is normal. No respiratory distress.     Breath sounds: Normal breath sounds. No wheezing or rales.  Abdominal:     General: Bowel sounds are normal. There is no distension.     Palpations: Abdomen is soft. There is no mass.     Tenderness: There is no abdominal tenderness.  Musculoskeletal:        General: No tenderness. Normal range of motion.  Cervical back: Normal range of motion and neck supple.  Lymphadenopathy:     Cervical: No cervical adenopathy.  Skin:    General: Skin is warm and dry.     Findings: No erythema or rash.     Comments: Laceration as noted above  Neurological:     Mental Status: He is alert.     Coordination: Coordination normal.     Comments: Awake alert follows commands without facial droop or slurred speech, lifts all 4 extremities with normal strength and coordination.  Psychiatric:        Behavior: Behavior normal.     ED Results / Procedures / Treatments   Labs (all labs ordered are listed, but only abnormal results are displayed) Labs Reviewed  COMPREHENSIVE METABOLIC PANEL - Abnormal; Notable for the following components:      Result Value   CO2 21 (*)    Glucose, Bld 114 (*)    BUN 37 (*)    Creatinine, Ser 1.41 (*)    Calcium 8.6 (*)     Total Protein 6.0 (*)    GFR calc non Af Amer 45 (*)    GFR calc Af Amer 53 (*)    All other components within normal limits  CBC - Abnormal; Notable for the following components:   RBC 3.64 (*)    Hemoglobin 11.0 (*)    HCT 35.2 (*)    All other components within normal limits  I-STAT CHEM 8, ED - Abnormal; Notable for the following components:   BUN 36 (*)    Creatinine, Ser 1.50 (*)    Glucose, Bld 109 (*)    Calcium, Ion 1.08 (*)    Hemoglobin 10.9 (*)    HCT 32.0 (*)    All other components within normal limits  CDS SEROLOGY  ETHANOL  LACTIC ACID, PLASMA  PROTIME-INR  URINALYSIS, ROUTINE W REFLEX MICROSCOPIC  SAMPLE TO BLOOD BANK    EKG EKG Interpretation  Date/Time:  Wednesday October 11 2019 16:48:51 EST Ventricular Rate:  67 PR Interval:    QRS Duration: 91 QT Interval:  436 QTC Calculation: 461 R Axis:   54 Text Interpretation: Sinus rhythm Ventricular premature complex Abnormal R-wave progression, early transition Minimal ST depression, lateral leads ECG OTHERWISE WITHIN NORMAL LIMITS No old tracing to compare Confirmed by Noemi Chapel [phone]) on 10/11/2019 5:10:20 PM   Radiology CT Head Wo Contrast  Result Date: 10/11/2019 CLINICAL DATA:  86 year old who fell off of a curb while at a shopping center earlier today with loss of consciousness and laceration to the LEFT eyebrow. Patient is amnestic to the event. Initial encounter. Personal history of subdural hematoma. EXAM: CT HEAD WITHOUT CONTRAST CT CERVICAL SPINE WITHOUT CONTRAST TECHNIQUE: Multidetector CT imaging of the head and cervical spine was performed following the standard protocol without intravenous contrast. Multiplanar CT image reconstructions of the cervical spine were also generated. COMPARISON:  05/31/2019 and earlier. FINDINGS: CT HEAD FINDINGS Brain: Moderate cortical, deep and cerebellar atrophy as noted previously. No mass lesion. No midline shift. No acute hemorrhage or hematoma. No  extra-axial fluid collections. No evidence of acute infarction. Dural thickening beneath the RIGHT frontal craniotomy flap. Vascular: Moderate to severe BILATERAL carotid siphon and mild BILATERAL vertebral artery atherosclerosis. No hyperdense vessel. Skull: Prior RIGHT frontal craniotomy. No skull fracture or other focal osseous abnormality involving the skull. Sinuses/Orbits: Preseptal soft tissue swelling/hematoma anterior to the LEFT orbit. No evidence of intraorbital hemorrhage. Other: None. CT CERVICAL SPINE FINDINGS Alignment: Anatomic posterior alignment.  Straightening of the usual cervical lordosis. Facet joints anatomically aligned throughout with severe diffuse degenerative changes. Skull base and vertebrae: No fractures identified involving the cervical spine. Coronal reformatted images demonstrate an intact craniocervical junction, intact dens and intact lateral masses throughout. Degenerative changes at the C1-C2 articulation with calcified pannus POSTERIOR to the dens. Soft tissues and spinal canal: No evidence of paraspinous or spinal canal hematoma. No evidence of spinal stenosis. Disc levels: Severe disc space narrowing and associated endplate hypertrophic changes at C6-7. Moderate disc space narrowing at C5-6 and C7-T1. Calcification within the C2-3 disc. Combination of facet and uncinate hypertrophy account for multilevel foraminal stenoses including severe BILATERAL C3-4, moderate BILATERAL C4-5, severe BILATERAL C5-6, severe BILATERAL C6-7. Upper chest: Visualized lung apices clear. Mild atherosclerosis involving the visualized proximal great vessels. Other: DISH involving the visualized UPPER thoracic spine. BILATERAL cervical carotid atherosclerosis. IMPRESSION: 1. No acute intracranial abnormality. 2. Moderate generalized atrophy. 3. No cervical spine fractures identified. 4. Multilevel degenerative disc disease, spondylosis and foraminal stenoses throughout the cervical spine as detailed  above. 5. Preseptal soft tissue swelling/hematoma anterior to the LEFT orbit without evidence of intraorbital hemorrhage. Electronically Signed   By: Evangeline Dakin M.D.   On: 10/11/2019 18:10   CT Cervical Spine Wo Contrast  Result Date: 10/11/2019 CLINICAL DATA:  86 year old who fell off of a curb while at a shopping center earlier today with loss of consciousness and laceration to the LEFT eyebrow. Patient is amnestic to the event. Initial encounter. Personal history of subdural hematoma. EXAM: CT HEAD WITHOUT CONTRAST CT CERVICAL SPINE WITHOUT CONTRAST TECHNIQUE: Multidetector CT imaging of the head and cervical spine was performed following the standard protocol without intravenous contrast. Multiplanar CT image reconstructions of the cervical spine were also generated. COMPARISON:  05/31/2019 and earlier. FINDINGS: CT HEAD FINDINGS Brain: Moderate cortical, deep and cerebellar atrophy as noted previously. No mass lesion. No midline shift. No acute hemorrhage or hematoma. No extra-axial fluid collections. No evidence of acute infarction. Dural thickening beneath the RIGHT frontal craniotomy flap. Vascular: Moderate to severe BILATERAL carotid siphon and mild BILATERAL vertebral artery atherosclerosis. No hyperdense vessel. Skull: Prior RIGHT frontal craniotomy. No skull fracture or other focal osseous abnormality involving the skull. Sinuses/Orbits: Preseptal soft tissue swelling/hematoma anterior to the LEFT orbit. No evidence of intraorbital hemorrhage. Other: None. CT CERVICAL SPINE FINDINGS Alignment: Anatomic posterior alignment. Straightening of the usual cervical lordosis. Facet joints anatomically aligned throughout with severe diffuse degenerative changes. Skull base and vertebrae: No fractures identified involving the cervical spine. Coronal reformatted images demonstrate an intact craniocervical junction, intact dens and intact lateral masses throughout. Degenerative changes at the C1-C2  articulation with calcified pannus POSTERIOR to the dens. Soft tissues and spinal canal: No evidence of paraspinous or spinal canal hematoma. No evidence of spinal stenosis. Disc levels: Severe disc space narrowing and associated endplate hypertrophic changes at C6-7. Moderate disc space narrowing at C5-6 and C7-T1. Calcification within the C2-3 disc. Combination of facet and uncinate hypertrophy account for multilevel foraminal stenoses including severe BILATERAL C3-4, moderate BILATERAL C4-5, severe BILATERAL C5-6, severe BILATERAL C6-7. Upper chest: Visualized lung apices clear. Mild atherosclerosis involving the visualized proximal great vessels. Other: DISH involving the visualized UPPER thoracic spine. BILATERAL cervical carotid atherosclerosis. IMPRESSION: 1. No acute intracranial abnormality. 2. Moderate generalized atrophy. 3. No cervical spine fractures identified. 4. Multilevel degenerative disc disease, spondylosis and foraminal stenoses throughout the cervical spine as detailed above. 5. Preseptal soft tissue swelling/hematoma anterior to the LEFT orbit without evidence  of intraorbital hemorrhage. Electronically Signed   By: Evangeline Dakin M.D.   On: 10/11/2019 18:10    Procedures .Marland KitchenLaceration Repair  Date/Time: 10/11/2019 5:08 PM Performed by: Noemi Chapel, MD Authorized by: Noemi Chapel, MD   Consent:    Consent obtained:  Verbal   Consent given by:  Patient   Risks discussed:  Infection, pain, need for additional repair, poor cosmetic result and poor wound healing   Alternatives discussed:  No treatment and delayed treatment Anesthesia (see MAR for exact dosages):    Anesthesia method:  Local infiltration   Local anesthetic:  Lidocaine 1% WITH epi Laceration details:    Location:  Face   Face location:  L eyebrow (Forehead and left eyebrow)   Length (cm):  6   Depth (mm):  2 Repair type:    Repair type:  Intermediate Pre-procedure details:    Preparation:  Patient was  prepped and draped in usual sterile fashion and imaging obtained to evaluate for foreign bodies Exploration:    Hemostasis achieved with:  Direct pressure   Wound exploration: wound explored through full range of motion and entire depth of wound probed and visualized     Wound extent: no fascia violation noted, no foreign bodies/material noted, no muscle damage noted, no nerve damage noted, no tendon damage noted, no underlying fracture noted and no vascular damage noted     Contaminated: no   Treatment:    Area cleansed with:  Betadine   Amount of cleaning:  Standard   Irrigation solution:  Sterile saline   Irrigation volume:  100 cc   Irrigation method:  Syringe Skin repair:    Repair method:  Sutures   Suture size:  5-0   Suture material:  Prolene   Suture technique:  Simple interrupted   Number of sutures:  8 Approximation:    Approximation:  Close Post-procedure details:    Dressing:  Antibiotic ointment and sterile dressing   Patient tolerance of procedure:  Tolerated well, no immediate complications Comments:     The patient tolerated the laceration repair very well.  The wound edges were approximated, the inferior edge of the wound was somewhat macerated and difficult to approximate, bleeding was controlled, dressing placed    (including critical care time)  Medications Ordered in ED Medications  lidocaine-EPINEPHrine (XYLOCAINE W/EPI) 2 %-1:200000 (PF) injection 20 mL (20 mLs Infiltration Given by Other 10/11/19 1647)  ondansetron (ZOFRAN) injection 4 mg (4 mg Intravenous Given 10/11/19 1649)    ED Course  I have reviewed the triage vital signs and the nursing notes.  Pertinent labs & imaging results that were available during my care of the patient were reviewed by me and considered in my medical decision making (see chart for details).    MDM Rules/Calculators/A&P                      This patient has signs of a head injury, his headache and vomiting suggest  possible brain injury, he will go to CT scan emergently, I have repaired his laceration, see repair note.  The patient is currently awake and amenable to the work-up and stabilizing care.  CT scan negative, patient stable for discharge, family member coming to pick him up, awake alert and requesting discharge, no signs of subdural hematoma.  Final Clinical Impression(s) / ED Diagnoses Final diagnoses:  Concussion with < 1 hr loss of consciousness  Laceration of scalp, initial encounter    Rx / DC Orders  ED Discharge Orders    None       Noemi Chapel, MD 10/11/19 1840

## 2019-10-11 NOTE — ED Notes (Signed)
Dovber Shall wife  [id number]

## 2019-10-11 NOTE — ED Triage Notes (Signed)
Pt here from [facility] where he had a mechanical fall off of a curb, witnessed two minute loc. Lac to L eyebrow.

## 2019-10-11 NOTE — ED Notes (Signed)
Pt transported to and from CT with this RN and Kae Heller NT.

## 2019-10-12 ENCOUNTER — Encounter: Payer: Self-pay | Admitting: Physical Therapy

## 2019-10-13 ENCOUNTER — Telehealth: Payer: Self-pay

## 2019-10-13 ENCOUNTER — Ambulatory Visit: Payer: Medicare Other | Admitting: Physical Therapy

## 2019-10-13 NOTE — Telephone Encounter (Signed)
Pt contacted and dc'ed from ED on 10/11/2019 after loss of consciousness. Pt to have stitches removed. Pt states that he feels he was told it was due to dehydration.   Pt is scheduled for 10/19/2019 with Dr. Quay Burow to have stitches removed.

## 2019-10-19 ENCOUNTER — Other Ambulatory Visit: Payer: Self-pay

## 2019-10-19 ENCOUNTER — Ambulatory Visit (INDEPENDENT_AMBULATORY_CARE_PROVIDER_SITE_OTHER): Payer: Medicare Other | Admitting: Internal Medicine

## 2019-10-19 ENCOUNTER — Ambulatory Visit: Payer: Medicare Other | Admitting: Physical Therapy

## 2019-10-19 ENCOUNTER — Encounter: Payer: Self-pay | Admitting: Internal Medicine

## 2019-10-19 DIAGNOSIS — W19XXXA Unspecified fall, initial encounter: Secondary | ICD-10-CM

## 2019-10-19 DIAGNOSIS — S060X1A Concussion with loss of consciousness of 30 minutes or less, initial encounter: Secondary | ICD-10-CM

## 2019-10-19 DIAGNOSIS — S060X9A Concussion with loss of consciousness of unspecified duration, initial encounter: Secondary | ICD-10-CM | POA: Insufficient documentation

## 2019-10-19 DIAGNOSIS — S0181XA Laceration without foreign body of other part of head, initial encounter: Secondary | ICD-10-CM | POA: Diagnosis not present

## 2019-10-19 DIAGNOSIS — S060XAA Concussion with loss of consciousness status unknown, initial encounter: Secondary | ICD-10-CM | POA: Insufficient documentation

## 2019-10-19 NOTE — Assessment & Plan Note (Signed)
New secondary to fall 2/17 Discussed concerning symptoms He is experiencing headaches when he does work and advised that he should limit how much he is doing and rest more.  If he starts to have increased headaches he needs to back off of activity

## 2019-10-19 NOTE — Assessment & Plan Note (Signed)
New - fall 2/17 Healing well, stitches removed No evidence of infection Can apply Vaseline or Aquaphor to help with healing

## 2019-10-19 NOTE — Patient Instructions (Addendum)
You can apply vaseline or aquaphor on your forehead wound.    Continue physical therapy.     Increase fluids.

## 2019-10-19 NOTE — Assessment & Plan Note (Signed)
[address] 2/17 in a parking lot, which she does not remember He does not recall call feeling any lightheadedness, dizziness, chest pain or palpitations prior to the fall He does not recall tripping Work-up in the ED showed possible dehydration and he does not drink enough fluids.  No other concerning findings Stressed increasing fluids Currently doing PT and advised doing some of those exercises at home regularly and continuing to exercise regularly Advised him to monitor for any palpitations, lightheadedness, which may indicate a drop in his blood pressure

## 2019-10-19 NOTE — Progress Notes (Signed)
Subjective:    Patient ID: Benjamin Hahn, male    DOB: Jun 04, 1933, 86 y.o.   MRN: [id number]  HPI The patient is here for follow up from the ED. he is here with his wife.  He went to the ED 2/17 after a fall outside of a store.  He tripped over a curb and struck his head on the ground and had a 2 minute LOC.  He had a bleeding laceration on the left forehead.  In the ED he had a headache, nausea.  There was no vomiting or seizure activity.  Ct of head and c-spine showed no acute or concerning findings.  His kidney function was stable, but he did appear dehydrated.  He received 8 stitches to the forehead. He was diagnosed with a concussion.  He was stable and discharged to home.    He does not recall anything about the fall or anything from the hospital.The wound on his forehead is healing well and his stitches were removed.  He has been experiencing occasional headaches.  He has been going to the office for a couple of hours and has noticed that sometimes the headaches will come with that activity.  He denies any dizziness, lightheadedness, blurry vision or nausea.  His wife is concerned because he does not drink enough fluids.  He is doing physical therapy at this time.  He is doing some exercise at the gym as well.  His legs do feel weak and he feels like his balance is off.  He denies any dizziness, chest pain or palpitations since being in the hospital or prior to being in the hospital.  On occasion he states he may have some lightheadedness, but is not specific about when and how often.      Medications and allergies reviewed with patient and updated if appropriate.  Patient Active Problem List   Diagnosis Date Noted  . Concussion 10/19/2019  . Laceration of forehead 10/19/2019  . Deficiency anemia 06/01/2019  . Vitamin D deficiency 06/01/2019  . Chronic idiopathic constipation 06/01/2019  . Traumatic subdural hematoma ([place]) 04/21/2019  . Hyperglycemia 04/15/2019  . Mild  intermittent asthma with acute exacerbation 02/08/2018  . Hypothyroidism 03/14/2013  . Hyperlipidemia LDL goal <70 03/14/2013  . BPH (benign prostatic hypertrophy) 03/14/2013  . Hypogonadism, male 03/14/2013  . Erectile dysfunction 03/14/2013    Current Outpatient Medications on File Prior to Visit  Medication Sig Dispense Refill  . acetaminophen (TYLENOL) 325 MG tablet Take 1-2 tablets (325-650 mg total) by mouth every 4 (four) hours as needed for mild pain or headache.    [name] acetaminophen (TYLENOL) 325 MG tablet Take 325-650 mg by mouth every 6 (six) hours as needed for mild pain or headache.    . ALPRAZolam (XANAX) 0.25 MG tablet 1  qhs 30 tablet 4  . ALPRAZolam (XANAX) 0.25 MG tablet Take 0.125-0.25 mg by mouth daily as needed for anxiety.     [name] atorvastatin (LIPITOR) 40 MG tablet Take 1 tablet (40 mg total) by mouth daily at 6 PM. 30 tablet 3  . atorvastatin (LIPITOR) 40 MG tablet Take 40 mg by mouth daily after supper.    [name] buPROPion (WELLBUTRIN XL) 300 MG 24 hr tablet Take 1 tablet (300 mg total) by mouth daily. 30 tablet 2  . buPROPion (WELLBUTRIN XL) 300 MG 24 hr tablet Take 300 mg by mouth daily.    . hydrOXYzine (VISTARIL) 25 MG capsule 1qhs  ( every other night) 30 capsule 4  .  levothyroxine (SYNTHROID) 88 MCG tablet TAKE 1 TABLET BY MOUTH DAILY. 90 tablet 1  . levothyroxine (SYNTHROID) 88 MCG tablet Take 88 mcg by mouth daily after supper.    . lidocaine (LIDODERM) 5 % Place 1 patch onto the skin daily. Remove & Discard patch within 12 hours or as directed by MD 30 patch 0  . linaclotide (LINZESS) 72 MCG capsule Take 1 capsule (72 mcg total) by mouth daily before breakfast. 84 capsule 0  . metoprolol succinate (TOPROL-XL) 25 MG 24 hr tablet TAKE (1/2) TABLET DAILY. 15 tablet 0  . metoprolol succinate (TOPROL-XL) 25 MG 24 hr tablet Take 12.5 mg by mouth daily.    . nitroGLYCERIN (NITROSTAT) 0.4 MG SL tablet Place 1 tablet (0.4 mg total) under the tongue every 5 (five) minutes as  needed for chest pain (CP or SOB). 25 tablet 12  . nitroGLYCERIN (NITROSTAT) 0.4 MG SL tablet Place 0.4 mg under the tongue every 5 (five) minutes as needed for chest pain.    . ramipril (ALTACE) 1.25 MG capsule Take 1.25 mg by mouth daily after supper.    . tamsulosin (FLOMAX) 0.4 MG CAPS capsule Take 1 capsule (0.4 mg total) by mouth daily after supper. 30 capsule 0  . tamsulosin (FLOMAX) 0.4 MG CAPS capsule Take 0.8 mg by mouth daily after breakfast.    . venlafaxine XR (EFFEXOR-XR) 37.5 MG 24 hr capsule Take 1 capsule (37.5 mg total) by mouth daily. 30 capsule 6  . venlafaxine XR (EFFEXOR-XR) 37.5 MG 24 hr capsule Take 37.5 mg by mouth daily with breakfast.    . [DISCONTINUED] metoprolol succinate (TOPROL-XL) 25 MG 24 hr tablet Take 0.5 tablets (12.5 mg total) by mouth daily. 30 tablet 0   No current facility-administered medications on file prior to visit.    Past Medical History:  Diagnosis Date  . Anxiety   . Arthritis    might be in back, no problems  . BPH (benign prostatic hyperplasia)   . Depression 1990s  . GERD (gastroesophageal reflux disease)    occasional  . Hypercholesteremia    controled  . Hypothyroid   . MI (myocardial infarction) ([place])   . Shingles May 2014   "Right face, still has some"  . Temporal arteritis ([place])   . Transient blindness of both eyes     Past Surgical History:  Procedure Laterality Date  . ARTERY BIOPSY Right 06/20/2013   Procedure: BIOPSY TEMPORAL ARTERY RIGHT;  Surgeon: Earnstine Regal, MD;  Location: [facility];  Service: General;  Laterality: Right;  . CARDIAC CATHETERIZATION N/A 09/02/2015   Procedure: Left Heart Cath and Coronary Angiography;  Surgeon: Charolette Forward, MD;  Location: [facility];  Service: Cardiovascular;  Laterality: N/A;  . CARDIAC CATHETERIZATION N/A 09/02/2015   Procedure: Coronary Stent Intervention;  Surgeon: Charolette Forward, MD;  Location: [facility];  Service: Cardiovascular;  Laterality: N/A;  1.  mid RCA       (3.0/28mm Xience) 2.  Mid LAD      (3.0/23mm Xience)  . CRANIOTOMY N/A 04/19/2019   Procedure: CRANIOTOMY HEMATOMA EVACUATION SUBDURAL;  Surgeon: Jovita Gamma, MD;  Location: [facility];  Service: Neurosurgery;  Laterality: N/A;  CRANIOTOMY HEMATOMA EVACUATION SUBDURAL  . None      Social History   Socioeconomic History  . Marital status: Married    Spouse name: Not on file  . Number of children: Not on file  . Years of education: Not on file  . Highest education level:  Not on file  Occupational History  . Not on file  Tobacco Use  . Smoking status: Former Smoker    Types: Cigarettes    Quit date: 08/24/1966    Years since quitting: 53.1  . Smokeless tobacco: Never Used  Substance and Sexual Activity  . Alcohol use: Not Currently    Comment: socially  . Drug use: Never  . Sexual activity: Not on file  Other Topics Concern  . Not on file  Social History Narrative   ** Merged History Encounter **       Currently works as a Midwife.  Lives with wife and they have two healthy daughters.   Social Determinants of Health   Financial Resource Strain:   . Difficulty of Paying Living Expenses: Not on file  Food Insecurity:   . Worried About Charity fundraiser in the Last Year: Not on file  . Ran Out of Food in the Last Year: Not on file  Transportation Needs:   . Lack of Transportation (Medical): Not on file  . Lack of Transportation (Non-Medical): Not on file  Physical Activity:   . Days of Exercise per Week: Not on file  . Minutes of Exercise per Session: Not on file  Stress:   . Feeling of Stress : Not on file  Social Connections:   . Frequency of Communication with Friends and Family: Not on file  . Frequency of Social Gatherings with Friends and Family: Not on file  . Attends Religious Services: Not on file  . Active Member of Clubs or Organizations: Not on file  . Attends Archivist Meetings: Not on file  . Marital Status: Not on file    Family  History  Problem Relation Age of Onset  . Stroke Father        Died, 80s  . Stroke Sister        Living, 105  . Heart disease Mother        Died, 7  . Healthy Daughter   . Diabetes Maternal Grandmother   . Hypertension Neg Hx     Review of Systems  Constitutional: Negative for chills and fever.  Eyes: Negative for visual disturbance.  Respiratory: Negative for cough, shortness of breath and wheezing.   Cardiovascular: Negative for chest pain, palpitations and leg swelling.  Gastrointestinal: Negative for nausea and vomiting.  Musculoskeletal: Positive for gait problem.  Neurological: Positive for headaches (occ - not in). Negative for dizziness, weakness, light-headedness and numbness.       Objective:   Vitals:   10/19/19 1045  BP: 126/60  Pulse: (!) 54  Resp: 16  Temp: 97.6 F (36.4 C)  SpO2: 95%   BP Readings from Last 3 Encounters:  10/19/19 126/60  10/11/19 132/78  06/01/19 (!) 110/54   Wt Readings from Last 3 Encounters:  10/19/19 182 lb (82.6 kg)  06/01/19 180 lb 8 oz (81.9 kg)  05/24/19 181 lb (82.1 kg)   Body mass index is 28.08 kg/m.   Physical Exam    Constitutional: Appears well-developed and well-nourished. No distress.  HENT:  Head: Normocephalic and atraumatic.  Neck: Neck supple. No tracheal deviation present. No thyromegaly present.  No cervical lymphadenopathy Cardiovascular: Normal rate, regular rhythm and normal heart sounds.  No murmur heard. No carotid bruit .  No edema Pulmonary/Chest: Effort normal and breath sounds normal. No respiratory distress. No has no wheezes. No rales.  Skin: Skin is warm and dry. Not diaphoretic.  Healing laceration  and scabs left forehead and lateral eyebrow without bleeding or active discharge.  No surrounding swelling or erythema Psychiatric: Normal mood and affect. Behavior is normal.      Assessment & Plan:    See Problem List for Assessment and Plan of chronic medical problems.    This visit  occurred during the SARS-CoV-2 public health emergency.  Safety protocols were in place, including screening questions prior to the visit, additional usage of staff PPE, and extensive cleaning of exam room while observing appropriate contact time as indicated for disinfecting solutions.

## 2019-10-23 ENCOUNTER — Ambulatory Visit: Payer: Medicare Other | Admitting: Physical Therapy

## 2019-10-25 ENCOUNTER — Telehealth (HOSPITAL_COMMUNITY): Payer: Self-pay | Admitting: *Deleted

## 2019-10-25 ENCOUNTER — Ambulatory Visit: Payer: Medicare Other | Admitting: Speech Pathology

## 2019-10-25 ENCOUNTER — Ambulatory Visit: Payer: Medicare Other | Admitting: Occupational Therapy

## 2019-10-25 ENCOUNTER — Other Ambulatory Visit (HOSPITAL_COMMUNITY): Payer: Self-pay | Admitting: *Deleted

## 2019-10-25 MED ORDER — TRIAZOLAM 0.25 MG PO TABS: 0.2500 mg | ORAL_TABLET | Freq: Every evening | ORAL | 2 refills | Status: DC | PRN

## 2019-10-25 NOTE — Telephone Encounter (Signed)
Received message from pt stating that he does not want to take Vistaril again due to waking up with great depression. So pt is asking if there is something else he can take? Pt did mention Restoril. Pt is currently on low dose of Xanax 0.25mg  qhs. Please review and advise.

## 2019-10-25 NOTE — Telephone Encounter (Signed)
Writer received t/c from pharmacist from [facility] verifying Rx for Halcion 0.25mg  qhs prn for sleep. Pharmacist wanted to share that pt fell in front of the pharmacy last week. Pt did not report this to Probation officer when we spoke. FYI.

## 2019-10-26 ENCOUNTER — Ambulatory Visit: Payer: Medicare Other | Attending: Physician Assistant | Admitting: Physical Therapy

## 2019-10-26 ENCOUNTER — Other Ambulatory Visit: Payer: Self-pay

## 2019-10-26 DIAGNOSIS — R2689 Other abnormalities of gait and mobility: Secondary | ICD-10-CM | POA: Insufficient documentation

## 2019-10-26 DIAGNOSIS — R2681 Unsteadiness on feet: Secondary | ICD-10-CM | POA: Diagnosis not present

## 2019-10-26 DIAGNOSIS — M6281 Muscle weakness (generalized): Secondary | ICD-10-CM | POA: Insufficient documentation

## 2019-10-26 NOTE — Therapy (Signed)
[facility] [address] [place], [place], [address] Phone: [phone]   Fax:  [phone]  Physical Therapy Treatment  Patient Details  Name: Benjamin Hahn MRN: [id number] Date of Birth: July 15, 1933 Referring Provider (PT): Jovita Gamma   Encounter Date: 10/26/2019  PT End of Session - 10/26/19 1653    Visit Number  13    Number of Visits  29    Date for PT Re-Evaluation  0000000   per recert [id number] day recert for 8 wk POC recert   Authorization Type  Medicare/BCBS-Will need 10th visit progress note    PT Start Time  0849    PT Stop Time  0932    PT Time Calculation (min)  43 min    Equipment Utilized During Treatment  Gait belt    Activity Tolerance  Patient tolerated treatment well    Behavior During Therapy  [facility] for tasks assessed/performed       Past Medical History:  Diagnosis Date  . Anxiety   . Arthritis    might be in back, no problems  . BPH (benign prostatic hyperplasia)   . Depression 1990s  . GERD (gastroesophageal reflux disease)    occasional  . Hypercholesteremia    controled  . Hypothyroid   . MI (myocardial infarction) ([place])   . Shingles May 2014   "Right face, still has some"  . Temporal arteritis ([place])   . Transient blindness of both eyes     Past Surgical History:  Procedure Laterality Date  . ARTERY BIOPSY Right 06/20/2013   Procedure: BIOPSY TEMPORAL ARTERY RIGHT;  Surgeon: Earnstine Regal, MD;  [facility];  Service: General;  Laterality: Right;  . CARDIAC CATHETERIZATION N/A 09/02/2015   Procedure: Left Heart Cath and Coronary Angiography;  Surgeon: Charolette Forward, MD;  Location: [facility];  Service: Cardiovascular;  Laterality: N/A;  . CARDIAC CATHETERIZATION N/A 09/02/2015   Procedure: Coronary Stent Intervention;  Surgeon: Charolette Forward, MD;  Location: [facility];  Service: Cardiovascular;  Laterality: N/A;  1.  mid RCA      (3.0/28mm  Xience) 2.  Mid LAD      (3.0/23mm Xience)  . CRANIOTOMY N/A 04/19/2019   Procedure: CRANIOTOMY HEMATOMA EVACUATION SUBDURAL;  Surgeon: Jovita Gamma, MD;  Location: [facility];  Service: Neurosurgery;  Laterality: N/A;  CRANIOTOMY HEMATOMA EVACUATION SUBDURAL  . None      There were no vitals filed for this visit.  Subjective Assessment - 10/26/19 1641    Subjective  Pt and wife present to therapy today; pt experienced fall on 10/11/2019 in parking lot and went to ED with concussion.  Per wife, he has seen Dr. [name] and no new hematoma present.  He reports he is using RW at home and at the office (does not have RW today at therapy).  He and wife are interested in continuing therapy for his balance.  They question about the OT and speech therapy evals; they are not interested in proceeding with those.    Patient is accompained by:  Family member   wife   Patient Stated Goals  Pt's goals are to improve walking, balance, strength.  Would like to get back to driving.    Currently in Pain?  No/denies                       [facility] Adult PT Treatment/Exercise - 10/26/19 0001      Transfers  Transfers  Sit to Stand;Stand to Sit    Sit to Stand  4: Min guard;5: Supervision;With upper extremity assist;From chair/3-in-1    Sit to Stand Details  Verbal cues for technique   Cues for forward lean, SLOWED PACE   Sit to Stand Details (indicate cue type and reason)  Pt experiences posterior LOB, decreased forward lean to initiate transfers, multiple times throughout session    Five time sit to stand comments   21.44    Stand to Sit  5: Supervision;4: Min guard;With upper extremity assist;To chair/3-in-1      Ambulation/Gait   Ambulation/Gait  Yes    Ambulation/Gait Assistance  4: Min guard    Ambulation Distance (Feet)  80 Feet   x 2   Assistive device  None   Pt does not bring in RW today; reports he's using at home   Gait Pattern  Step-through pattern;Decreased step length -  right;Decreased step length - left;Narrow base of support;Trunk flexed;Shuffle;Step-to pattern;Poor foot clearance - left;Poor foot clearance - right    Ambulation Surface  Level;Indoor    Gait velocity  14 sec = 2.34 ft/sec    Gait Comments  Advised pt/wife that based on balance scores today, pt would benefit from using RW at all times.      Berg Balance Test   Sit to Stand  Able to stand  independently using hands    Standing Unsupported  Able to stand safely 2 minutes    Sitting with Back Unsupported but Feet Supported on Floor or Stool  Able to sit safely and securely 2 minutes    Stand to Sit  Controls descent by using hands    Transfers  Able to transfer safely, definite need of hands    Standing Unsupported with Eyes Closed  Able to stand 10 seconds with supervision    Standing Ubsupported with Feet Together  Able to place feet together independently and stand for 1 minute with supervision    From Standing, Reach Forward with Outstretched Arm  Can reach forward >12 cm safely (5")    From Standing Position, Pick up Object from Floor  Able to pick up shoe, needs supervision    From Standing Position, Turn to Look Behind Over each Shoulder  Looks behind from both sides and weight shifts well    Turn 360 Degrees  Needs close supervision or verbal cueing    Standing Unsupported, Alternately Place Feet on Step/Stool  Able to complete 4 steps without aid or supervision    Standing Unsupported, One Foot in Front  Able to take small step independently and hold 30 seconds    Standing on One Leg  Unable to try or needs assist to prevent fall    Total Score  38      Dynamic Gait Index   Level Surface  Moderate Impairment    Change in Gait Speed  Moderate Impairment    Gait with Horizontal Head Turns  Moderate Impairment    Gait with Vertical Head Turns  Moderate Impairment    Gait and Pivot Turn  Moderate Impairment    Step Over Obstacle  Moderate Impairment    Step Around Obstacles  Moderate  Impairment    Steps  Mild Impairment    Total Score  9      Timed Up and Go Test   TUG  Normal TUG;Cognitive TUG    Normal TUG (seconds)  15.13    Cognitive TUG (seconds)  19.12  PT Education - 10/26/19 1650    Education Details  Results of balance testing this visit (pt's measures have all slowed and PT recommneds continued PT, use of RW at all times); reinitiated HEP (new [place] code, as pt is not doing previous ex per pt report)    Person(s) Educated  Patient   (wife up front scheduling, when HEP provided)   Methods  Explanation;Demonstration;Verbal cues;Handout    Comprehension  Verbalized understanding;Returned demonstration;Verbal cues required;Tactile cues required;Need further instruction       PT Short Term Goals - 10/26/19 1707      PT SHORT TERM GOAL #1   Title  Pt will be independent with HEP for improved strength, balance, and gait.  TARGET 11/24/2019    Time  4    Period  Weeks    Status  Revised      PT SHORT TERM GOAL #2   Title  Pt will improve TUG score to less than or equal to 13.5 seconds for decreased fall risk.    Baseline  10/26/2019:  15.13 sec    Time  4    Period  Weeks    Status  Revised      PT SHORT TERM GOAL #3   Title  Pt will improve Berg Balance score to at least 43/56 for decreased fall risk.    Baseline  10/26/2019:  38/56    Time  4    Period  Weeks    Status  Revised      PT SHORT TERM GOAL #4   Title  Pt will improve 5x sit<>stand to less than or equal to 18 seconds for decreased fall risk.    Baseline  21.44 sec with posterior lean 10/26/2019    Time  4    Period  Weeks    Status  Revised      PT SHORT TERM GOAL #5   Title  Pt will verbalize understanding of fall prevention in home envrionment.    Time  4    Period  Weeks    Status  New        PT Long Term Goals - 10/26/19 1711      PT LONG TERM GOAL #1   Title  Pt will be independent with progression of HEP for improved balance, strength, and gait.  For  all LTGs:  UPDATED TARGET 12/22/2019    Time  8    Period  Weeks    Status  Revised      PT LONG TERM GOAL #2   Title  Pt will improve TUG cognitive score to less than or equal to 15 seconds for decreased fall risk.    Baseline  19.12 sec 10/26/2019    Time  8    Period  Weeks    Status  Revised      PT LONG TERM GOAL #3   Title  Pt will improve Berg score to at least 48/56 for decreased fall risk.    Baseline  38/56 10/26/2019    Time  8    Period  Weeks    Status  Revised      PT LONG TERM GOAL #4   Title  Pt will improve gait velocity to at least 2.62 ft/sec for improved gait efficiency and safety.    Time  8    Period  Weeks    Status  Revised      PT LONG TERM GOAL #5   Title  Pt  will perform at least 8 of 10 reps of sit<>stand from 18" surfaces with no posterior lean, for improved transfers.    Time  8    Period  Weeks    Status  Revised      PT LONG TERM GOAL #6   Title  Pt will ambulate at least 500 ft, indoor and outdoor surfaces, for community ambulation, with appropriate assistive device and supervision.    Time  8    Period  Weeks    Status  Revised            Plan - 10/26/19 1658    Clinical Impression Statement  Pt presents to OPPT for first visit since 10/05/2019, due to being away following a fall in the community 10/11/2019.  With this fall, pt went to ED and sustained consussion.  Per wife report, Dr. [name] states no new subdural hematoma present.  With balance measures, pt demonstrates decline in all measures since last check.  5x sit<>stand has slowed from 13.87 sec to 21.44 sec (with STRONG POSTERIOR LEAN at times with attempts at sit<>stand); gait velocity has slowed from 2.9 ft/sec to 2.34 ft/sec; Berg score has declined from 46/56 to 38/56.  TUG score has slowed from 13.02 sec to 15.13 sec.  DGI score has decreased from 13/24 to 9/24.  Objective meausres indicate increased fall risk and PT educated pt/wife that RW would be safest option for gait at  this time.  Please see recert and updated goals to address balance, decreased functional strength and transfers, decreased independence and safety with gait.    Personal Factors and Comorbidities  Comorbidity 2;Behavior Pattern   hx of falls   Comorbidities  HTN, hyperlipidemia    Examination-Activity Limitations  Locomotion Level;Transfers;Squat;Stairs;Stand;Other    Examination-Participation Restrictions  Community Activity;Driving;Other   walking in neighborhood, exercise at fitness center   Stability/Clinical Decision Making  Evolving/Moderate complexity    PT Frequency  2x / week    PT Duration  8 weeks   per recert [id number]   PT Treatment/Interventions  ADLs/Self Care Home Management;Gait training;Stair training;Functional mobility training;Therapeutic activities;Therapeutic exercise;Balance training;Neuromuscular re-education;Patient/family education    PT Next Visit Plan  Review additions to HEP, work on floor>stand transfers, BLE strengthening, gait and balance    PT Home Exercise Plan  Access Code: [email or web address] and Access Code: [email or web address] (pt reports no longer doing these); 10/26/2019:  new access code/new HEP initiated-please start with these and build new into this code    Consulted and Agree with Plan of Care  Patient;Family member/caregiver    Family Member Consulted  wife-at beginning and end of session       Patient will benefit from skilled therapeutic intervention in order to improve the following deficits and impairments:  Abnormal gait, Difficulty walking, Decreased activity tolerance, Decreased balance, Decreased mobility, Decreased strength, Postural dysfunction  Visit Diagnosis: Other abnormalities of gait and mobility  Unsteadiness on feet  Muscle weakness (generalized)     Problem List Patient Active Problem List   Diagnosis Date Noted  . Concussion 10/19/2019  . Laceration of forehead 10/19/2019  . Fall 10/19/2019  . Deficiency anemia 06/01/2019  . Vitamin D  deficiency 06/01/2019  . Chronic idiopathic constipation 06/01/2019  . Traumatic subdural hematoma ([place]) 04/21/2019  . Hyperglycemia 04/15/2019  . Mild intermittent asthma with acute exacerbation 02/08/2018  . Hypothyroidism 03/14/2013  . Hyperlipidemia LDL goal <70 03/14/2013  . BPH (benign prostatic hypertrophy) 03/14/2013  . Hypogonadism, male 03/14/2013  . Erectile dysfunction 03/14/2013  Handy Mcloud W. 10/26/2019, 5:16 PM  Frazier Butt., PT   [facility] [address] [place], [place], [address] Phone: [phone]   Fax:  [phone]  Name: ZEPHEN MARINEZ MRN: [id number] Date of Birth: 19-Jul-1933

## 2019-10-26 NOTE — Patient Instructions (Signed)
Access Code: [email or web address] URL: [email or web address] Date: 10/26/2019 Prepared by: Mady Haagensen  Exercises Seated Toe Raise - 10 reps - 2 sets - 3 sec hold - 1x daily - 7x weekly Proper Sit to Stand Technique - 5 reps - 1-2 sets - 1x daily - 7x weekly

## 2019-10-30 ENCOUNTER — Ambulatory Visit: Payer: Medicare Other | Admitting: Physical Therapy

## 2019-10-31 ENCOUNTER — Other Ambulatory Visit: Payer: Self-pay | Admitting: Internal Medicine

## 2019-11-02 ENCOUNTER — Ambulatory Visit: Payer: Medicare Other | Admitting: Physical Therapy

## 2019-11-02 DIAGNOSIS — S2231XA Fracture of one rib, right side, initial encounter for closed fracture: Secondary | ICD-10-CM | POA: Diagnosis not present

## 2019-11-08 ENCOUNTER — Encounter: Payer: Self-pay | Admitting: Physical Therapy

## 2019-11-08 ENCOUNTER — Other Ambulatory Visit: Payer: Self-pay

## 2019-11-08 ENCOUNTER — Ambulatory Visit: Payer: Medicare Other | Admitting: Physical Therapy

## 2019-11-08 DIAGNOSIS — R2681 Unsteadiness on feet: Secondary | ICD-10-CM | POA: Diagnosis not present

## 2019-11-08 DIAGNOSIS — R2689 Other abnormalities of gait and mobility: Secondary | ICD-10-CM

## 2019-11-08 DIAGNOSIS — M6281 Muscle weakness (generalized): Secondary | ICD-10-CM | POA: Diagnosis not present

## 2019-11-08 NOTE — Patient Instructions (Addendum)
Access Code: [email or web address] URL: [email or web address] Date: 11/08/2019 Prepared by: Nita Sells  Exercises Supine Hamstring Stretch with Strap - 3 x daily - 7 x weekly - 3 sets - 60 seconds hold Seated Hamstring Stretch with Strap - 3 x daily - 7 x weekly - 3 sets - 60 seconds hold Seated Hamstring Stretch - 2-3 x daily - 7 x weekly - 3 reps - 1 sets - 30 sec hold Seated Toe Raise - 1-2 x daily - 7 x weekly - 2 sets - 10 reps - 3 second hold Proper Sit to Stand Technique - 1 x daily - 5 x weekly - 2 sets - 5-10 reps Staggered Stance Forward Backward Weight Shift with Counter Support - 1 x daily - 5 x weekly - 10 reps - 1-2 sets Alternating Heel Raises - 1 x daily - 5 x weekly - 10 reps - 1-2 sets Mini Squat with Counter Support - 1 x daily - 5 x weekly - 10 reps - 2 sets March in Place - 1 x daily - 5 x weekly - 10 reps - 2 sets Correct Standing Posture - 2 x daily - 7 x weekly - 3 sets - 60 seconds hold   .Local Driver Evaluation Programs:  Comprehensive Evaluation: includes clinical and in vehicle behind the wheel testing by OCCUPATIONAL THERAPIST. Programs have varying levels of adaptive controls available for trial.   [employer], [place] [address] [place], [place]  [address] [phone] or [phone] [email or web address] Evaluator:  Richelle Ito, OT/CDRS/CDI/SCDCM/[facility] [address] [place], [place] [address] [phone] [email or web address] Evaluators:  Bertram Savin, OT and Mertie Clause, OT  [facility]) [place] ([facility]) Physical [place] [address] [place], [place]  [address] [id number] [email or web address] Evaluators:  Bernadene Bell, KT; Heron Sabins, KT;  Shirlee Latch, KT (KT=kiniesotherapist)   Clinical evaluations only:  Includes clinical testing, refers to other programs or local certified driving instructor for behind the wheel testing.  [facility] (outpatient Rehab) [place] [address] [place], [place] [address] [phone] for scheduling [email or web address] Evaluators:  Valentino Hue, OT; Haynes Hoehn, OT  Other area clinical evaluators available upon request including [facility], [place] and [facility], [facility], [facility].       Resource List What is a Warden/ranger: Your Road Ahead - A Guide to [employer] Evaluations http://www.the[place]ford.com/resources/mature-market-excellence/publications-on-aging  Association for Musician - Disability and Driving Fact Sheets http://www.aded.net/?page=510  Driving after a Brain Injury: Brain Injury Association of [employer] [place]ale[place]ates.be?A=SearchResult&SearchID=9495675&ObjectID=2758842&ObjectType=35  Driving with Adaptive Equipment: [employer] DebtRide.[email or web address].au

## 2019-11-08 NOTE — Therapy (Signed)
[facility] [address] [place], [place], [address] Phone: [phone]   Fax:  [phone]  Physical Therapy Treatment  Patient Details  Name: Benjamin Hahn MRN: [id number] Date of Birth: 07/11/1933 Referring Provider (PT): Jovita Gamma   Encounter Date: 11/08/2019  PT End of Session - 11/08/19 0946    Visit Number  14    Number of Visits  29    Date for PT Re-Evaluation  0000000   per recert [id number] day recert for 8 wk POC recert   Authorization Type  Medicare/BCBS-Will need 10th visit progress note    PT Start Time  0845    PT Stop Time  0935    PT Time Calculation (min)  50 min    Equipment Utilized During Treatment  Gait belt    Activity Tolerance  Patient tolerated treatment well    Behavior During Therapy  [facility] for tasks assessed/performed       Past Medical History:  Diagnosis Date  . Anxiety   . Arthritis    might be in back, no problems  . BPH (benign prostatic hyperplasia)   . Depression 1990s  . GERD (gastroesophageal reflux disease)    occasional  . Hypercholesteremia    controled  . Hypothyroid   . MI (myocardial infarction) ([place])   . Shingles May 2014   "Right face, still has some"  . Temporal arteritis ([place])   . Transient blindness of both eyes     Past Surgical History:  Procedure Laterality Date  . ARTERY BIOPSY Right 06/20/2013   Procedure: BIOPSY TEMPORAL ARTERY RIGHT;  Surgeon: Earnstine Regal, MD;  [facility];  Service: General;  Laterality: Right;  . CARDIAC CATHETERIZATION N/A 09/02/2015   Procedure: Left Heart Cath and Coronary Angiography;  Surgeon: Charolette Forward, MD;  Location: [place] [facility];  Service: Cardiovascular;  Laterality: N/A;  . CARDIAC CATHETERIZATION N/A 09/02/2015   Procedure: Coronary Stent Intervention;  Surgeon: Charolette Forward, MD;  Location: [place] [facility];  Service: Cardiovascular;  Laterality: N/A;  1.  mid RCA      (3.0/28mm  Xience) 2.  Mid LAD      (3.0/23mm Xience)  . CRANIOTOMY N/A 04/19/2019   Procedure: CRANIOTOMY HEMATOMA EVACUATION SUBDURAL;  Surgeon: Jovita Gamma, MD;  Location: [facility];  Service: Neurosurgery;  Laterality: N/A;  CRANIOTOMY HEMATOMA EVACUATION SUBDURAL  . None      There were no vitals filed for this visit.  Subjective Assessment - 11/08/19 0850    Subjective  When asked about walker pt stated "My wife wanted me to use it but I wanted to try it without."  Denies falls or changes since last visit.  Asking about getting his driver's license but states "I don't think I'll be able to drive anymore."    Patient is accompained by:  Family member   wife   Patient Stated Goals  Pt's goals are to improve walking, balance, strength.  Would like to get back to driving.    Currently in Pain?  No/denies        [facility] Adult PT Treatment/Exercise - 11/08/19 0001      Transfers   Transfers  Sit to Stand;Stand to Sit    Sit to Stand  5: Supervision;4: Min guard;4: Min assist    Sit to Stand Details  Verbal cues for technique;Tactile cues for weight beaing;Manual facilitation for weight shifting    Sit to Stand Details (indicate cue type and reason)  Pt continues to exhibit decreased forward leaning/weight shift, LOB backwards and inability to achieve standing at times.  With education pt able to perform with supervision but quickly reverts back to improper technique.    Stand to Sit  5: Supervision;4: Min guard    Number of Reps  10 reps;Other sets (comment)   3 sets   Transfer Cueing  cues for forward lean, scooting to edge of chair    Comments  completed one set of 10 reps with blue therapy ball in front of patient and pt placing bil hands on ball then rolling ball away until achieves standing.      Ambulation/Gait   Ambulation/Gait  Yes    Ambulation/Gait Assistance  4: Min guard    Ambulation Distance (Feet)  110 Feet   x 3   Assistive device  None    Gait Pattern  Step-through  pattern;Decreased step length - right;Decreased step length - left;Narrow base of support;Trunk flexed;Shuffle;Step-to pattern;Poor foot clearance - left;Poor foot clearance - right    Ambulation Surface  Level;Indoor    Gait Comments  Pt arrived to clinic without assistive device today.  States he was "brave" and didn't think he needed it.  Discussed importance of using rollator at all times due to increased risk of falls and decreased balance with need for assistance during session to recover balance.  Pt had significant LOB with a turn today and mod assist to recover.  Pt needs cues for heel strike and increased step length with gait.      Posture/Postural Control   Posture/Postural Control  Postural limitations    Postural Limitations  Rounded Shoulders;Forward head;Posterior pelvic tilt      Therapeutic Activites    Therapeutic Activities  Other Therapeutic Activities   sit<>stand transfers     Neuro Re-ed    Neuro Re-ed Details   Forward step weight shifting and backward step weight shifting in // bars with 1-2 UE support and max cues for technique.  Pt with decreased heel strike and decreased weight shift along with decreased upright trunk.  Repeated >30 times each side and each direction.      Exercises   Exercises  Knee/Hip;Ankle      Knee/Hip Exercises: Stretches   Active Hamstring Stretch  Right;Left;3 reps;30 seconds    Active Hamstring Stretch Limitations  seated in chair with foot on stool      Knee/Hip Exercises: Standing   Heel Raises  Both;1 set;10 reps    Hip Flexion  Stengthening;Both;1 set;15 reps;Knee bent    Hip Flexion Limitations  needs bil UE assist      Ankle Exercises: Standing   Toe Raise  10 reps;1 second             PT Education - 11/08/19 0945    Education Details  HEP, need to use Rollator at all times for safety, adding additional appointments per POC    Person(s) Educated  Patient    Methods  Explanation;Demonstration;Verbal cues;Handout     Comprehension  Verbalized understanding;Need further instruction       PT Short Term Goals - 10/26/19 1707      PT SHORT TERM GOAL #1   Title  Pt will be independent with HEP for improved strength, balance, and gait.  TARGET 11/24/2019    Time  4    Period  Weeks    Status  Revised      PT SHORT TERM GOAL #2   Title  Pt will improve  TUG score to less than or equal to 13.5 seconds for decreased fall risk.    Baseline  10/26/2019:  15.13 sec    Time  4    Period  Weeks    Status  Revised      PT SHORT TERM GOAL #3   Title  Pt will improve Berg Balance score to at least 43/56 for decreased fall risk.    Baseline  10/26/2019:  38/56    Time  4    Period  Weeks    Status  Revised      PT SHORT TERM GOAL #4   Title  Pt will improve 5x sit<>stand to less than or equal to 18 seconds for decreased fall risk.    Baseline  21.44 sec with posterior lean 10/26/2019    Time  4    Period  Weeks    Status  Revised      PT SHORT TERM GOAL #5   Title  Pt will verbalize understanding of fall prevention in home envrionment.    Time  4    Period  Weeks    Status  New        PT Long Term Goals - 10/26/19 1711      PT LONG TERM GOAL #1   Title  Pt will be independent with progression of HEP for improved balance, strength, and gait.  For all LTGs:  UPDATED TARGET 12/22/2019    Time  8    Period  Weeks    Status  Revised      PT LONG TERM GOAL #2   Title  Pt will improve TUG cognitive score to less than or equal to 15 seconds for decreased fall risk.    Baseline  19.12 sec 10/26/2019    Time  8    Period  Weeks    Status  Revised      PT LONG TERM GOAL #3   Title  Pt will improve Berg score to at least 48/56 for decreased fall risk.    Baseline  38/56 10/26/2019    Time  8    Period  Weeks    Status  Revised      PT LONG TERM GOAL #4   Title  Pt will improve gait velocity to at least 2.62 ft/sec for improved gait efficiency and safety.    Time  8    Period  Weeks    Status  Revised       PT LONG TERM GOAL #5   Title  Pt will perform at least 8 of 10 reps of sit<>stand from 18" surfaces with no posterior lean, for improved transfers.    Time  8    Period  Weeks    Status  Revised      PT LONG TERM GOAL #6   Title  Pt will ambulate at least 500 ft, indoor and outdoor surfaces, for community ambulation, with appropriate assistive device and supervision.    Time  8    Period  Weeks    Status  Revised            Plan - 11/08/19 0946    Clinical Impression Statement  Pt arrived to clinic again today without rollator.  Continues to be at risk for falls and had significant LOB in clinic requiring assist to recover.  Skilled session focused on gait, balance and HEP.  Continue PT per POC.    Personal Factors and Comorbidities  Comorbidity  2;Behavior Pattern   hx of falls   Comorbidities  HTN, hyperlipidemia    Examination-Activity Limitations  Locomotion Level;Transfers;Squat;Stairs;Stand;Other    Examination-Participation Restrictions  Community Activity;Driving;Other   walking in neighborhood, exercise at fitness center   Stability/Clinical Decision Making  Evolving/Moderate complexity    PT Frequency  2x / week    PT Duration  8 weeks   per recert [id number]   PT Treatment/Interventions  ADLs/Self Care Home Management;Gait training;Stair training;Functional mobility training;Therapeutic activities;Therapeutic exercise;Balance training;Neuromuscular re-education;Patient/family education    PT Next Visit Plan  Review additions to HEP, work on floor>stand transfers, BLE strengthening, gait and balance    PT Home Exercise Plan  Access Code: [email or web address] (condensed to this access code on 11/08/19.  This code should contain all current and appropriate exercises    Consulted and Agree with Plan of Care  Patient       Patient will benefit from skilled therapeutic intervention in order to improve the following deficits and impairments:  Abnormal gait, Difficulty walking, Decreased  activity tolerance, Decreased balance, Decreased mobility, Decreased strength, Postural dysfunction  Visit Diagnosis: Other abnormalities of gait and mobility  Unsteadiness on feet  Muscle weakness (generalized)     Problem List Patient Active Problem List   Diagnosis Date Noted  . Concussion 10/19/2019  . Laceration of forehead 10/19/2019  . Fall 10/19/2019  . Deficiency anemia 06/01/2019  . Vitamin D deficiency 06/01/2019  . Chronic idiopathic constipation 06/01/2019  . Traumatic subdural hematoma ([place]) 04/21/2019  . Hyperglycemia 04/15/2019  . Mild intermittent asthma with acute exacerbation 02/08/2018  . Hypothyroidism 03/14/2013  . Hyperlipidemia LDL goal <70 03/14/2013  . BPH (benign prostatic hypertrophy) 03/14/2013  . Hypogonadism, male 03/14/2013  . Erectile dysfunction 03/14/2013    Narda Bonds, PTA [place] 11/08/19 10:03 AM Phone: [phone] Fax: [facility] [address] [place], [place], [address] Phone: [phone]   Fax:  [phone]  Name: Benjamin Hahn MRN: [id number] Date of Birth: May 08, 1933

## 2019-11-09 ENCOUNTER — Telehealth (HOSPITAL_COMMUNITY): Payer: Self-pay | Admitting: *Deleted

## 2019-11-09 NOTE — Telephone Encounter (Signed)
Pt called to ask if the Vistaril 25mg  could be increased to 50mg  at hs. He says it works and he has no "hangover". Please review and advise.

## 2019-11-10 ENCOUNTER — Other Ambulatory Visit (HOSPITAL_COMMUNITY): Payer: Self-pay | Admitting: *Deleted

## 2019-11-10 MED ORDER — HYDROXYZINE PAMOATE 50 MG PO CAPS: 50.0000 mg | ORAL_CAPSULE | Freq: Every evening | ORAL | 1 refills | Status: DC

## 2019-11-14 ENCOUNTER — Ambulatory Visit: Payer: BLUE CROSS/BLUE SHIELD | Admitting: Physical Therapy

## 2019-11-21 ENCOUNTER — Other Ambulatory Visit: Payer: Self-pay

## 2019-11-21 ENCOUNTER — Ambulatory Visit: Payer: Medicare Other | Admitting: Physical Therapy

## 2019-11-21 ENCOUNTER — Encounter: Payer: Self-pay | Admitting: Physical Therapy

## 2019-11-21 DIAGNOSIS — R2681 Unsteadiness on feet: Secondary | ICD-10-CM

## 2019-11-21 DIAGNOSIS — R2689 Other abnormalities of gait and mobility: Secondary | ICD-10-CM | POA: Diagnosis not present

## 2019-11-21 DIAGNOSIS — M6281 Muscle weakness (generalized): Secondary | ICD-10-CM | POA: Diagnosis not present

## 2019-11-21 NOTE — Therapy (Signed)
[facility] [address] [place], [place], [address] Phone: [phone]   Fax:  [phone]  Physical Therapy Treatment  Patient Details  Name: Benjamin Hahn MRN: [id number] Date of Birth: 01-01-1933 Referring Provider (PT): Jovita Gamma   Encounter Date: 11/21/2019  PT End of Session - 11/21/19 1311    Visit Number  15    Number of Visits  29    Date for PT Re-Evaluation  0000000   per recert [id number] day recert for 8 wk POC recert   Authorization Type  Medicare/BCBS-Will need 10th visit progress note    PT Start Time  1103    PT Stop Time  1149    PT Time Calculation (min)  46 min    Equipment Utilized During Treatment  Gait belt    Activity Tolerance  Patient tolerated treatment well    Behavior During Therapy  [facility] for tasks assessed/performed       Past Medical History:  Diagnosis Date  . Anxiety   . Arthritis    might be in back, no problems  . BPH (benign prostatic hyperplasia)   . Depression 1990s  . GERD (gastroesophageal reflux disease)    occasional  . Hypercholesteremia    controled  . Hypothyroid   . MI (myocardial infarction) ([employer])   . Shingles May 2014   "Right face, still has some"  . Temporal arteritis ([place])   . Transient blindness of both eyes     Past Surgical History:  Procedure Laterality Date  . ARTERY BIOPSY Right 06/20/2013   Procedure: BIOPSY TEMPORAL ARTERY RIGHT;  Surgeon: Earnstine Regal, MD;  [facility];  Service: General;  Laterality: Right;  . CARDIAC CATHETERIZATION N/A 09/02/2015   Procedure: Left Heart Cath and Coronary Angiography;  Surgeon: Charolette Forward, MD;  Location: [place] [facility];  Service: Cardiovascular;  Laterality: N/A;  . CARDIAC CATHETERIZATION N/A 09/02/2015   Procedure: Coronary Stent Intervention;  Surgeon: Charolette Forward, MD;  Location: [facility];  Service: Cardiovascular;  Laterality: N/A;  1.  mid RCA      (3.0/28mm  Xience) 2.  Mid LAD      (3.0/23mm Xience)  . CRANIOTOMY N/A 04/19/2019   Procedure: CRANIOTOMY HEMATOMA EVACUATION SUBDURAL;  Surgeon: Jovita Gamma, MD;  Location: [facility];  Service: Neurosurgery;  Laterality: N/A;  CRANIOTOMY HEMATOMA EVACUATION SUBDURAL  . None      There were no vitals filed for this visit.  Subjective Assessment - 11/21/19 1022    Subjective  "We forgot to bring it." re: walker.  Pt reports continued LOB with turns.    Patient is accompained by:  Family member   wife   Patient Stated Goals  Pt's goals are to improve walking, balance, strength.  Would like to get back to driving.    Currently in Pain?  No/denies         [facility] Adult PT Treatment/Exercise - 11/21/19 0001      Transfers   Transfers  Sit to Stand;Stand to Sit    Sit to Stand  4: Min assist    Sit to Stand Details  Verbal cues for technique;Tactile cues for weight beaing;Manual facilitation for weight shifting    Stand to Sit  5: Supervision;4: Min guard    Stand to Sit Details (indicate cue type and reason)  Verbal cues for technique;Verbal cues for precautions/safety    Stand to Sit Details  cues to control descent  Ambulation/Gait   Ambulation/Gait  Yes    Ambulation/Gait Assistance  4: Min guard;4: Min assist    Ambulation Distance (Feet)  110 Feet   x 3 and 220 x 1   Assistive device  None    Gait Pattern  Step-through pattern;Decreased step length - right;Decreased step length - left;Narrow base of support;Trunk flexed;Shuffle;Step-to pattern;Poor foot clearance - left;Poor foot clearance - right    Ambulation Surface  Level;Indoor    Gait Comments  Pt again arrived to clinic without assistive device.  Pt states that he forgot it.  Continue to reinforce the recommendation to use Rollator at all times to decrease fall risk.  Pt needs cues with gait to increased step length along with staying close to rollator.      Exercises   Exercises  Knee/Hip;Ankle      Knee/Hip Exercises:  Stretches   Active Hamstring Stretch  Right;Left;3 reps;30 seconds;Limitations    Active Hamstring Stretch Limitations  seated on edge of mat with strap and max cues to maintain proper position.  Also reinstructed on how to perform with foot on floor without strap.      Knee/Hip Exercises: Aerobic   Other Aerobic  Scifit level 3.0 all 4 extremities x 9 minutes for flexibility and strengthening working on maintaining >55 rpm with bouts of increasing to 65.  Pt needing max cues to maintain speed and full ROM.      Knee/Hip Exercises: Standing   Heel Raises  Both;1 set;10 reps    Hip Flexion  Stengthening;Both;1 set;15 reps;Knee bent    Hip Flexion Limitations  needs bil UE assist    Functional Squat  1 set;10 reps;Limitations    Functional Squat Limitations  needs bil UE assist      Knee/Hip Exercises: Seated   Long Arc Quad  Both;10 reps;1 set    Sit to Sand  2 sets;5 reps;without UE support   needs max cues and min assist to maintain forward weight      Ankle Exercises: Seated   Toe Raise  10 reps;2 seconds             PT Education - 11/21/19 1310    Education Details  Revised HEP, importance of Rollator at all times to decrease fall risk    Person(s) Educated  Patient    Methods  Explanation;Demonstration;Handout    Comprehension  Verbalized understanding;Other (comment)   needs continued reinforcement      PT Short Term Goals - 10/26/19 1707      PT SHORT TERM GOAL #1   Title  Pt will be independent with HEP for improved strength, balance, and gait.  TARGET 11/24/2019    Time  4    Period  Weeks    Status  Revised      PT SHORT TERM GOAL #2   Title  Pt will improve TUG score to less than or equal to 13.5 seconds for decreased fall risk.    Baseline  10/26/2019:  15.13 sec    Time  4    Period  Weeks    Status  Revised      PT SHORT TERM GOAL #3   Title  Pt will improve Berg Balance score to at least 43/56 for decreased fall risk.    Baseline  10/26/2019:  38/56     Time  4    Period  Weeks    Status  Revised      PT SHORT TERM GOAL #4  Title  Pt will improve 5x sit<>stand to less than or equal to 18 seconds for decreased fall risk.    Baseline  21.44 sec with posterior lean 10/26/2019    Time  4    Period  Weeks    Status  Revised      PT SHORT TERM GOAL #5   Title  Pt will verbalize understanding of fall prevention in home envrionment.    Time  4    Period  Weeks    Status  New        PT Long Term Goals - 10/26/19 1711      PT LONG TERM GOAL #1   Title  Pt will be independent with progression of HEP for improved balance, strength, and gait.  For all LTGs:  UPDATED TARGET 12/22/2019    Time  8    Period  Weeks    Status  Revised      PT LONG TERM GOAL #2   Title  Pt will improve TUG cognitive score to less than or equal to 15 seconds for decreased fall risk.    Baseline  19.12 sec 10/26/2019    Time  8    Period  Weeks    Status  Revised      PT LONG TERM GOAL #3   Title  Pt will improve Berg score to at least 48/56 for decreased fall risk.    Baseline  38/56 10/26/2019    Time  8    Period  Weeks    Status  Revised      PT LONG TERM GOAL #4   Title  Pt will improve gait velocity to at least 2.62 ft/sec for improved gait efficiency and safety.    Time  8    Period  Weeks    Status  Revised      PT LONG TERM GOAL #5   Title  Pt will perform at least 8 of 10 reps of sit<>stand from 18" surfaces with no posterior lean, for improved transfers.    Time  8    Period  Weeks    Status  Revised      PT LONG TERM GOAL #6   Title  Pt will ambulate at least 500 ft, indoor and outdoor surfaces, for community ambulation, with appropriate assistive device and supervision.    Time  8    Period  Weeks    Status  Revised            Plan - 11/21/19 1311    Clinical Impression Statement  Skilled session focused on gait and exercises for strength and stretching.  Pt continues to be at risk for falls and not using rollator consistently  despite recommendations.  Continue PT per POC.    Personal Factors and Comorbidities  Comorbidity 2;Behavior Pattern   hx of falls   Comorbidities  HTN, hyperlipidemia    Examination-Activity Limitations  Locomotion Level;Transfers;Squat;Stairs;Stand;Other    Examination-Participation Restrictions  Community Activity;Driving;Other   walking in neighborhood, exercise at fitness center   Stability/Clinical Decision Making  Evolving/Moderate complexity    PT Frequency  2x / week    PT Duration  8 weeks   per recert [id number]   PT Treatment/Interventions  ADLs/Self Care Home Management;Gait training;Stair training;Functional mobility training;Therapeutic activities;Therapeutic exercise;Balance training;Neuromuscular re-education;Patient/family education    PT Next Visit Plan  work on floor>stand transfers, BLE strengthening, gait and balance.  Pt would like some UE exercises as well.  PT Home Exercise Plan  Access Code: [email or web address] (condensed to this access code on 11/08/19.  This code should contain all current and appropriate exercises    Consulted and Agree with Plan of Care  Patient       Patient will benefit from skilled therapeutic intervention in order to improve the following deficits and impairments:  Abnormal gait, Difficulty walking, Decreased activity tolerance, Decreased balance, Decreased mobility, Decreased strength, Postural dysfunction  Visit Diagnosis: Other abnormalities of gait and mobility  Unsteadiness on feet  Muscle weakness (generalized)     Problem List Patient Active Problem List   Diagnosis Date Noted  . Concussion 10/19/2019  . Laceration of forehead 10/19/2019  . Fall 10/19/2019  . Deficiency anemia 06/01/2019  . Vitamin D deficiency 06/01/2019  . Chronic idiopathic constipation 06/01/2019  . Traumatic subdural hematoma ([place]) 04/21/2019  . Hyperglycemia 04/15/2019  . Mild intermittent asthma with acute exacerbation 02/08/2018  . Hypothyroidism  03/14/2013  . Hyperlipidemia LDL goal <70 03/14/2013  . BPH (benign prostatic hypertrophy) 03/14/2013  . Hypogonadism, male 03/14/2013  . Erectile dysfunction 03/14/2013    Narda Bonds, PTA [place] 11/21/19 1:15 PM Phone: [phone] Fax: [facility] [address] [place], [place], [address] Phone: [phone]   Fax:  [phone]  Name: Benjamin Hahn MRN: [id number] Date of Birth: 01-Mar-1933

## 2019-11-21 NOTE — Patient Instructions (Signed)
Access Code: [email or web address]: [email or web address]: 03/30/2021Prepared by: Langley Gauss RobertsonExercises  Supine Hamstring Stretch with Strap - 3 x daily - 7 x weekly - 3 sets - 60 seconds hold  Seated Hamstring Stretch with Strap - 3 x daily - 7 x weekly - 3 sets - 60 seconds hold  Seated Hamstring Stretch - 2-3 x daily - 7 x weekly - 3 reps - 1 sets - 30 sec hold  Seated Toe Raise - 1-2 x daily - 7 x weekly - 2 sets - 10 reps - 3 second hold  Proper Sit to Stand Technique - 1 x daily - 5 x weekly - 2 sets - 5-10 reps  Staggered Stance Forward Backward Weight Shift with Counter Support - 1 x daily - 5 x weekly - 10 reps - 1-2 sets  Alternating Heel Raises - 1 x daily - 5 x weekly - 10 reps - 1-2 sets  Correct Standing Posture - 2 x daily - 7 x weekly - 3 sets - 60 seconds hold

## 2019-11-22 ENCOUNTER — Telehealth (HOSPITAL_COMMUNITY): Payer: Self-pay | Admitting: *Deleted

## 2019-11-22 ENCOUNTER — Ambulatory Visit: Payer: Medicare Other | Admitting: Physical Therapy

## 2019-11-22 NOTE — Telephone Encounter (Signed)
Received t/c from [facility] expressing concern regarding pt medication because he is a fall risk and has actually fallen in front of the pharmacy once. Pharm specifically asking about the Vistaril. FYI.

## 2019-11-23 ENCOUNTER — Ambulatory Visit: Payer: Medicare Other | Admitting: Physical Therapy

## 2019-11-23 ENCOUNTER — Encounter: Payer: Self-pay | Admitting: Physical Therapy

## 2019-11-23 ENCOUNTER — Ambulatory Visit: Payer: Medicare Other | Attending: Physician Assistant | Admitting: Physical Therapy

## 2019-11-23 ENCOUNTER — Other Ambulatory Visit: Payer: Self-pay

## 2019-11-23 DIAGNOSIS — R2689 Other abnormalities of gait and mobility: Secondary | ICD-10-CM | POA: Insufficient documentation

## 2019-11-23 DIAGNOSIS — M6281 Muscle weakness (generalized): Secondary | ICD-10-CM | POA: Diagnosis not present

## 2019-11-23 DIAGNOSIS — R42 Dizziness and giddiness: Secondary | ICD-10-CM | POA: Insufficient documentation

## 2019-11-23 DIAGNOSIS — R2681 Unsteadiness on feet: Secondary | ICD-10-CM | POA: Diagnosis not present

## 2019-11-23 NOTE — Therapy (Signed)
[facility] [address] [place], [place], [address] Phone: [phone]   Fax:  [phone]  Physical Therapy Treatment  Patient Details  Name: Benjamin Hahn MRN: [id number] Date of Birth: 03-30-1933 Referring Provider (PT): Jovita Gamma   Encounter Date: 11/23/2019  PT End of Session - 11/23/19 1036    Visit Number  16    Number of Visits  29    Date for PT Re-Evaluation  0000000   per recert [id number] day recert for 8 wk POC recert   Authorization Type  Medicare/BCBS-Will need 10th visit progress note    PT Start Time  0939    PT Stop Time  1017    PT Time Calculation (min)  38 min    Activity Tolerance  Patient tolerated treatment well    Behavior During Therapy  [facility] for tasks assessed/performed       Past Medical History:  Diagnosis Date  . Anxiety   . Arthritis    might be in back, no problems  . BPH (benign prostatic hyperplasia)   . Depression 1990s  . GERD (gastroesophageal reflux disease)    occasional  . Hypercholesteremia    controled  . Hypothyroid   . MI (myocardial infarction) ([place])   . Shingles May 2014   "Right face, still has some"  . Temporal arteritis ([place])   . Transient blindness of both eyes     Past Surgical History:  Procedure Laterality Date  . ARTERY BIOPSY Right 06/20/2013   Procedure: BIOPSY TEMPORAL ARTERY RIGHT;  Surgeon: Earnstine Regal, MD;  Location: [facility];  Service: General;  Laterality: Right;  . CARDIAC CATHETERIZATION N/A 09/02/2015   Procedure: Left Heart Cath and Coronary Angiography;  Surgeon: Charolette Forward, MD;  Location: [facility];  Service: Cardiovascular;  Laterality: N/A;  . CARDIAC CATHETERIZATION N/A 09/02/2015   Procedure: Coronary Stent Intervention;  Surgeon: Charolette Forward, MD;  Location: [facility];  Service: Cardiovascular;  Laterality: N/A;  1.  mid RCA      (3.0/28mm Xience) 2.  Mid LAD      (3.0/23mm Xience)  . CRANIOTOMY  N/A 04/19/2019   Procedure: CRANIOTOMY HEMATOMA EVACUATION SUBDURAL;  Surgeon: Jovita Gamma, MD;  Location: [facility];  Service: Neurosurgery;  Laterality: N/A;  CRANIOTOMY HEMATOMA EVACUATION SUBDURAL  . None      There were no vitals filed for this visit.  Subjective Assessment - 11/23/19 0945    Subjective  Pt denies any falls.  Had a tooth surgically removed yesterday.    Patient is accompained by:  Family member   daughter brought to clinic   Patient Stated Goals  Pt's goals are to improve walking, balance, strength.  Would like to get back to driving.    Currently in Pain?  Yes    Pain Score  5     Pain Location  Other (Comment)   gum from tooth removal   Pain Orientation  Right    Pain Type  Acute pain    Pain Onset  Yesterday    Pain Relieving Factors  pain meds         [facility] Adult PT Treatment/Exercise - 11/23/19 0001      Transfers   Transfers  Sit to Stand;Stand to Sit    Sit to Stand  5: Supervision;4: Min guard    Sit to Stand Details  Verbal cues for technique    Stand to Sit  5: Supervision;4: Min guard  Stand to Sit Details (indicate cue type and reason)  Verbal cues for precautions/safety    Stand to Sit Details  cues to turn completely to chair and control descent      Ambulation/Gait   Ambulation/Gait  Yes    Ambulation/Gait Assistance  4: Min guard    Ambulation Distance (Feet)  110 Feet   x 2 and 80' x 3   Assistive device  Rollator    Gait Pattern  Step-through pattern;Decreased step length - right;Decreased step length - left;Narrow base of support;Trunk flexed;Shuffle;Step-to pattern;Poor foot clearance - left;Poor foot clearance - right    Ambulation Surface  Level;Indoor    Gait Comments  cues for heel strike and increased intensity of swing on r side and to stay close to rollator      Exercises   Exercises  Knee/Hip;Shoulder;Elbow      Elbow Exercises   Elbow Flexion  Strengthening;Both;10 reps;Seated;Theraband;Limitations    Theraband  Level (Elbow Flexion)  Level 2 (Red)    Elbow Flexion Limitations  cues for full ROM and decreased speed      Knee/Hip Exercises: Aerobic   Other Aerobic  Scifit level 2.5 all 4 extremities x 7 minutes with rpm>55 rpm      Knee/Hip Exercises: Machines for Strengthening   Cybex Knee Flexion  .      Knee/Hip Exercises: Seated   Long Arc Quad  Both;2 sets;15 reps;Weights    Long Arc Quad Weight  3 lbs.    Long [employer] Limitations  cues for full ROM     Marching  Both;2 sets;15 reps;Weights    Marching Limitations  cues for full rom    [employer]  3 lbs.      Shoulder Exercises: Seated   Retraction  Both;15 reps;Theraband;Limitations    Theraband Level (Shoulder Retraction)  Level 2 (Red);Level 3 (Green)    Retraction Limitations  cues for full ROM;one set with red and one with green    Row  Both;15 reps;Theraband    Theraband Level (Shoulder Row)  Level 2 (Red);Level 3 (Green)      Shoulder Exercises: Standing   Other Standing Exercises  standing for zoom ball x 1 minute but pt unable to achieve full ROM due to R shoulder decreased AROM.  Needed +1 assist to stand with pt for safety               PT Short Term Goals - 10/26/19 1707      PT SHORT TERM GOAL #1   Title  Pt will be independent with HEP for improved strength, balance, and gait.  TARGET 11/24/2019    Time  4    Period  Weeks    Status  Revised      PT SHORT TERM GOAL #2   Title  Pt will improve TUG score to less than or equal to 13.5 seconds for decreased fall risk.    Baseline  10/26/2019:  15.13 sec    Time  4    Period  Weeks    Status  Revised      PT SHORT TERM GOAL #3   Title  Pt will improve Berg Balance score to at least 43/56 for decreased fall risk.    Baseline  10/26/2019:  38/56    Time  4    Period  Weeks    Status  Revised      PT SHORT TERM GOAL #4   Title  Pt will improve 5x sit<>stand  to less than or equal to 18 seconds for decreased fall risk.    Baseline  21.44 sec with  posterior lean 10/26/2019    Time  4    Period  Weeks    Status  Revised      PT SHORT TERM GOAL #5   Title  Pt will verbalize understanding of fall prevention in home envrionment.    Time  4    Period  Weeks    Status  New        PT Long Term Goals - 10/26/19 1711      PT LONG TERM GOAL #1   Title  Pt will be independent with progression of HEP for improved balance, strength, and gait.  For all LTGs:  UPDATED TARGET 12/22/2019    Time  8    Period  Weeks    Status  Revised      PT LONG TERM GOAL #2   Title  Pt will improve TUG cognitive score to less than or equal to 15 seconds for decreased fall risk.    Baseline  19.12 sec 10/26/2019    Time  8    Period  Weeks    Status  Revised      PT LONG TERM GOAL #3   Title  Pt will improve Berg score to at least 48/56 for decreased fall risk.    Baseline  38/56 10/26/2019    Time  8    Period  Weeks    Status  Revised      PT LONG TERM GOAL #4   Title  Pt will improve gait velocity to at least 2.62 ft/sec for improved gait efficiency and safety.    Time  8    Period  Weeks    Status  Revised      PT LONG TERM GOAL #5   Title  Pt will perform at least 8 of 10 reps of sit<>stand from 18" surfaces with no posterior lean, for improved transfers.    Time  8    Period  Weeks    Status  Revised      PT LONG TERM GOAL #6   Title  Pt will ambulate at least 500 ft, indoor and outdoor surfaces, for community ambulation, with appropriate assistive device and supervision.    Time  8    Period  Weeks    Status  Revised            Plan - 11/23/19 1037    Clinical Impression Statement  Skilled session focused on gait, strength and endurance.  Pt declined floor transfer today due to not sleeping well after tooth extraction yesterday.  Pt continues to need cues for gait technique and safety along with cues to perform exercises correctly.  Continue PT per POC.    Personal Factors and Comorbidities  Comorbidity 2;Behavior Pattern   hx  of falls   Comorbidities  HTN, hyperlipidemia    Examination-Activity Limitations  Locomotion Level;Transfers;Squat;Stairs;Stand;Other    Examination-Participation Restrictions  Community Activity;Driving;Other   walking in neighborhood, exercise at fitness center   Stability/Clinical Decision Making  Evolving/Moderate complexity    PT Frequency  2x / week    PT Duration  8 weeks   per recert [id number]   PT Treatment/Interventions  ADLs/Self Care Home Management;Gait training;Stair training;Functional mobility training;Therapeutic activities;Therapeutic exercise;Balance training;Neuromuscular re-education;Patient/family education    PT Next Visit Plan  work on floor>stand transfers, BLE strengthening, gait and balance.  Pt would like  some UE exercises as well.    PT Home Exercise Plan  Access Code: [email or web address] (condensed to this access code on 11/08/19.  This code should contain all current and appropriate exercises    Consulted and Agree with Plan of Care  Patient       Patient will benefit from skilled therapeutic intervention in order to improve the following deficits and impairments:  Abnormal gait, Difficulty walking, Decreased activity tolerance, Decreased balance, Decreased mobility, Decreased strength, Postural dysfunction  Visit Diagnosis: Other abnormalities of gait and mobility     Problem List Patient Active Problem List   Diagnosis Date Noted  . Concussion 10/19/2019  . Laceration of forehead 10/19/2019  . Fall 10/19/2019  . Deficiency anemia 06/01/2019  . Vitamin D deficiency 06/01/2019  . Chronic idiopathic constipation 06/01/2019  . Traumatic subdural hematoma ([place]) 04/21/2019  . Hyperglycemia 04/15/2019  . Mild intermittent asthma with acute exacerbation 02/08/2018  . Hypothyroidism 03/14/2013  . Hyperlipidemia LDL goal <70 03/14/2013  . BPH (benign prostatic hypertrophy) 03/14/2013  . Hypogonadism, male 03/14/2013  . Erectile dysfunction 03/14/2013    Narda Bonds, PTA [place] 11/23/19 10:39 AM Phone: [phone] Fax: [facility] [address] [place], [place], [address] Phone: [phone]   Fax:  [phone]  Name: Benjamin Hahn MRN: [id number] Date of Birth: [birth year]

## 2019-11-27 ENCOUNTER — Ambulatory Visit: Payer: Medicare Other | Admitting: Physical Therapy

## 2019-11-29 ENCOUNTER — Other Ambulatory Visit: Payer: Self-pay | Admitting: Internal Medicine

## 2019-11-29 ENCOUNTER — Ambulatory Visit: Payer: Medicare Other | Admitting: Physical Therapy

## 2019-12-05 ENCOUNTER — Ambulatory Visit: Payer: Medicare Other | Admitting: Physical Therapy

## 2019-12-07 ENCOUNTER — Encounter: Payer: Self-pay | Admitting: Internal Medicine

## 2019-12-07 ENCOUNTER — Other Ambulatory Visit: Payer: Self-pay

## 2019-12-07 ENCOUNTER — Telehealth: Payer: Self-pay

## 2019-12-07 ENCOUNTER — Ambulatory Visit (INDEPENDENT_AMBULATORY_CARE_PROVIDER_SITE_OTHER): Payer: Medicare Other | Admitting: Internal Medicine

## 2019-12-07 ENCOUNTER — Encounter: Payer: Self-pay | Admitting: Physical Therapy

## 2019-12-07 ENCOUNTER — Ambulatory Visit: Payer: Medicare Other | Admitting: Physical Therapy

## 2019-12-07 VITALS — BP 98/58 | HR 57 | Temp 98.2°F | Resp 16 | Ht 67.5 in | Wt 180.0 lb

## 2019-12-07 DIAGNOSIS — I959 Hypotension, unspecified: Secondary | ICD-10-CM

## 2019-12-07 DIAGNOSIS — R2689 Other abnormalities of gait and mobility: Secondary | ICD-10-CM | POA: Diagnosis not present

## 2019-12-07 DIAGNOSIS — D539 Nutritional anemia, unspecified: Secondary | ICD-10-CM

## 2019-12-07 DIAGNOSIS — K5904 Chronic idiopathic constipation: Secondary | ICD-10-CM

## 2019-12-07 DIAGNOSIS — M6281 Muscle weakness (generalized): Secondary | ICD-10-CM | POA: Diagnosis not present

## 2019-12-07 DIAGNOSIS — F5104 Psychophysiologic insomnia: Secondary | ICD-10-CM

## 2019-12-07 DIAGNOSIS — R42 Dizziness and giddiness: Secondary | ICD-10-CM

## 2019-12-07 DIAGNOSIS — E039 Hypothyroidism, unspecified: Secondary | ICD-10-CM

## 2019-12-07 DIAGNOSIS — R2681 Unsteadiness on feet: Secondary | ICD-10-CM

## 2019-12-07 DIAGNOSIS — Z0000 Encounter for general adult medical examination without abnormal findings: Secondary | ICD-10-CM | POA: Diagnosis not present

## 2019-12-07 LAB — CBC WITH DIFFERENTIAL/PLATELET
Basophils Absolute: 0.1 10*3/uL (ref 0.0–0.1)
Basophils Relative: 0.9 % (ref 0.0–3.0)
Eosinophils Absolute: 0.2 10*3/uL (ref 0.0–0.7)
Eosinophils Relative: 2.8 % (ref 0.0–5.0)
HCT: 31.9 % — ABNORMAL LOW (ref 39.0–52.0)
Hemoglobin: 10.7 g/dL — ABNORMAL LOW (ref 13.0–17.0)
Lymphocytes Relative: 28.3 % (ref 12.0–46.0)
Lymphs Abs: 1.7 10*3/uL (ref 0.7–4.0)
MCHC: 33.6 g/dL (ref 30.0–36.0)
MCV: 92.1 fl (ref 78.0–100.0)
Monocytes Absolute: 0.5 10*3/uL (ref 0.1–1.0)
Monocytes Relative: 8.4 % (ref 3.0–12.0)
Neutro Abs: 3.6 10*3/uL (ref 1.4–7.7)
Neutrophils Relative %: 59.6 % (ref 43.0–77.0)
Platelets: 201 10*3/uL (ref 150.0–400.0)
RBC: 3.46 Mil/uL — ABNORMAL LOW (ref 4.22–5.81)
RDW: 13.9 % (ref 11.5–15.5)
WBC: 6 10*3/uL (ref 4.0–10.5)

## 2019-12-07 LAB — IBC PANEL
Iron: 71 ug/dL (ref 42–165)
Saturation Ratios: 27.1 % (ref 20.0–50.0)
Transferrin: 187 mg/dL — ABNORMAL LOW (ref 212.0–360.0)

## 2019-12-07 LAB — URINALYSIS, ROUTINE W REFLEX MICROSCOPIC
Bilirubin Urine: NEGATIVE
Hgb urine dipstick: NEGATIVE
Ketones, ur: NEGATIVE
Leukocytes,Ua: NEGATIVE
Nitrite: NEGATIVE
RBC / HPF: NONE SEEN (ref 0–?)
Specific Gravity, Urine: 1.02 (ref 1.000–1.030)
Total Protein, Urine: NEGATIVE
Urine Glucose: NEGATIVE
Urobilinogen, UA: 0.2 (ref 0.0–1.0)
WBC, UA: NONE SEEN (ref 0–?)
pH: 6 (ref 5.0–8.0)

## 2019-12-07 LAB — BASIC METABOLIC PANEL
BUN: 24 mg/dL — ABNORMAL HIGH (ref 6–23)
CO2: 32 mEq/L (ref 19–32)
Calcium: 8.9 mg/dL (ref 8.4–10.5)
Chloride: 104 mEq/L (ref 96–112)
Creatinine, Ser: 1.35 mg/dL (ref 0.40–1.50)
GFR: 50.25 mL/min — ABNORMAL LOW (ref >60.00)
Glucose, Bld: 94 mg/dL (ref 70–99)
Potassium: 4.4 mEq/L (ref 3.5–5.1)
Sodium: 139 mEq/L (ref 135–145)

## 2019-12-07 LAB — HEPATIC FUNCTION PANEL
ALT: 13 U/L (ref 0–53)
AST: 15 U/L (ref 0–37)
Albumin: 3.9 g/dL (ref 3.5–5.2)
Alkaline Phosphatase: 50 U/L (ref 39–117)
Bilirubin, Direct: 0.1 mg/dL (ref 0.0–0.3)
Total Bilirubin: 0.4 mg/dL (ref 0.2–1.2)
Total Protein: 6 g/dL (ref 6.0–8.3)

## 2019-12-07 LAB — TSH: TSH: 3.08 u[IU]/mL (ref 0.35–4.50)

## 2019-12-07 LAB — FERRITIN: Ferritin: 205.3 ng/mL (ref 22.0–322.0)

## 2019-12-07 LAB — VITAMIN B12: Vitamin B-12: 275 pg/mL (ref 211–911)

## 2019-12-07 LAB — FOLATE: Folate: 16.8 ng/mL (ref >5.9)

## 2019-12-07 LAB — CORTISOL: Cortisol, Plasma: 5.6 ug/dL

## 2019-12-07 MED ORDER — ESZOPICLONE 1 MG PO TABS: 1.0000 mg | ORAL_TABLET | Freq: Every evening | ORAL | 1 refills | Status: DC | PRN

## 2019-12-07 MED ORDER — TRULANCE 3 MG PO TABS: 1.0000 | ORAL_TABLET | Freq: Every day | ORAL | 1 refills | Status: DC

## 2019-12-07 NOTE — Therapy (Signed)
[facility] [address] [place], [place], [address] Phone: [phone]   Fax:  [phone]  Physical Therapy Treatment  Patient Details  Name: Benjamin Hahn MRN: [id number] Date of Birth: 1933/04/22 Referring Provider (PT): Jovita Gamma   Encounter Date: 12/07/2019  PT End of Session - 12/07/19 1158    Visit Number  17    Number of Visits  29    Date for PT Re-Evaluation  08/67/61   per recert 04/29/931-67 day recert for 8 wk POC recert   Authorization Type  Medicare/BCBS-Will need 10th visit progress note    PT Start Time  0849    PT Stop Time  0933    PT Time Calculation (min)  44 min    Activity Tolerance  Patient tolerated treatment well   LImited by dizziness, head fullness, progression to headache   Behavior During Therapy  [facility] for tasks assessed/performed       Past Medical History:  Diagnosis Date  . Anxiety   . Arthritis    might be in back, no problems  . BPH (benign prostatic hyperplasia)   . Depression 1990s  . GERD (gastroesophageal reflux disease)    occasional  . Hypercholesteremia    controled  . Hypothyroid   . MI (myocardial infarction) ([place])   . Shingles May 2014   "Right face, still has some"  . Temporal arteritis ([place])   . Transient blindness of both eyes     Past Surgical History:  Procedure Laterality Date  . ARTERY BIOPSY Right 06/20/2013   Procedure: BIOPSY TEMPORAL ARTERY RIGHT;  Surgeon: Earnstine Regal, MD;  Location: [facility];  Service: General;  Laterality: Right;  . CARDIAC CATHETERIZATION N/A 09/02/2015   Procedure: Left Heart Cath and Coronary Angiography;  Surgeon: Charolette Forward, MD;  Location: [facility];  Service: Cardiovascular;  Laterality: N/A;  . CARDIAC CATHETERIZATION N/A 09/02/2015   Procedure: Coronary Stent Intervention;  Surgeon: Charolette Forward, MD;  Location: [facility];  Service: Cardiovascular;  Laterality: N/A;  1.  mid RCA  (3.0/28mm Xience) 2.  Mid LAD      (3.0/23mm Xience)  . CRANIOTOMY N/A 04/19/2019   Procedure: CRANIOTOMY HEMATOMA EVACUATION SUBDURAL;  Surgeon: Jovita Gamma, MD;  Location: [facility];  Service: Neurosurgery;  Laterality: N/A;  CRANIOTOMY HEMATOMA EVACUATION SUBDURAL  . None      There were no vitals filed for this visit.  Subjective Assessment - 12/07/19 0851    Subjective  Have to see my primary MD later today in regards to some prescriptions; haven't been able to sleep well.  I'm using the rollator now, as "my walking is suspect."  Have mostly been using that at home.  Have had some near falls, but no falls.    Patient is accompained by:  Family member   daughter brought to clinic   Patient Stated Goals  Pt's goals are to improve walking, balance, strength.  Would like to get back to driving.    Currently in Pain?  No/denies    Pain Onset  --             Vestibular Assessment - 12/07/19 0001      Vestibular Assessment   General Observation  During Berg Balance test, with 360 turn, pt c/o "dizziness", "fullness in head".  With alternating step taps to 6" step, pt c/o increased fullness, headache (6/10).        Symptom Behavior   Subjective history  of current problem  Pt unsure how long this has been going on; wife unaware of pt's descriptions of symptoms.  Pt reports he has had vertigo before, and this is different.    Type of Dizziness   Unsteady with head/body turns;"Funny feeling in head"    Frequency of Dizziness  occasional (brought on today in PT session by full turns, alternating step taps, and seated gaze stabilization)  Pt reports brought on at home with quick turns in kitchen.    Duration of Dizziness  during session today, up to a minute    Symptom Nature  Motion provoked;Intermittent    Aggravating Factors  Turning body quickly;Turning head quickly;Turning head sideways   Step taps, 360 turn   Relieving Factors  Head stationary    History of similar episodes   Reports hx of vertigo, but he reports this is a different feeling      Oculomotor Exam   Oculomotor Alignment  Abnormal   L eye appears lower than R, head tilted slightly to L   Ocular ROM  WFL, except decreased posterior tracking noted    Smooth Pursuits  Intact      Vestibulo-Ocular Reflex   VOR 1 Head Only (x 1 viewing)  10-15 sec, with pt reporting increased symptoms to 4/10.  Has to stop, as he reprots headache starting.    Comment  Pt tries to close eyes, so unsure if nystagmus present.  Headache subsides after 30-60 sec.        Visual Acuity   Static  Line 7    Dynamic  Unable to discern letters while head moving.  Pt very resistant to head movement assisted by PT.               [facility] Adult PT Treatment/Exercise - 12/07/19 0001      Transfers   Transfers  Sit to Stand;Stand to Sit    Sit to Stand  5: Supervision;4: Min guard;With upper extremity assist;Without upper extremity assist;From bed;From chair/3-in-1    Sit to Stand Details  Verbal cues for technique    Five time sit to stand comments   13.84    Stand to Sit  5: Supervision;4: Min guard;With upper extremity assist;Without upper extremity assist;To bed;To chair/3-in-1    Stand to Sit Details (indicate cue type and reason)  Verbal cues for precautions/safety    Stand to Sit Details  Cues to turn fully then let go of rollator to reach to mat and sit.    Number of Reps  Other reps (comment)   at least 5 reps throughout session     Ambulation/Gait   Ambulation/Gait  Yes    Ambulation/Gait Assistance  4: Min guard    Ambulation/Gait Assistance Details  Cues for increased step length, heelstrike    Ambulation Distance (Feet)  115 Feet   80 x 2   Assistive device  Rollator    Gait Pattern  Step-through pattern;Decreased step length - right;Decreased step length - left;Narrow base of support;Trunk flexed;Shuffle;Step-to pattern;Poor foot clearance - left;Poor foot clearance - right    Ambulation Surface   Level;Indoor    Gait Comments  Cues for heelstrike, cues to stay close to rollator      Berg Balance Test   Sit to Stand  Able to stand without using hands and stabilize independently    Standing Unsupported  Able to stand safely 2 minutes    Sitting with Back Unsupported but Feet Supported on Floor or Stool  Able  to sit safely and securely 2 minutes    Stand to Sit  Sits safely with minimal use of hands    Transfers  Able to transfer safely, minor use of hands    Standing Unsupported with Eyes Closed  Able to stand 10 seconds with supervision    Standing Ubsupported with Feet Together  Able to place feet together independently and stand for 1 minute with supervision    From Standing, Reach Forward with Outstretched Arm  Can reach forward >12 cm safely (5")    From Standing Position, Pick up Object from [place] to pick up shoe, needs supervision    From Standing Position, Turn to Look Behind Over each Shoulder  Looks behind from both sides and weight shifts well    Turn 360 Degrees  Able to turn 360 degrees safely but slowly    Standing Unsupported, Alternately Place Feet on Step/Stool  Able to stand independently and complete 8 steps >20 seconds    Standing Unsupported, One Foot in Front  Able to take small step independently and hold 30 seconds    Standing on One Leg  Unable to try or needs assist to prevent fall    Total Score  43      Timed Up and Go Test   TUG  Normal TUG    Normal TUG (seconds)  17.28   rollator; 13.66 no device            PT Education - 12/07/19 1156    Education Details  Progress towards goals, findings in relation to pt's c/o head fullness/dizziness (unsure if this is BPPV or vestibular-oculo reflex related), but this may be affecting balance more recently    Person(s) Educated  Patient;Spouse   wife at end of session   Methods  Explanation    Comprehension  Verbalized understanding       PT Short Term Goals - 12/07/19 0859      PT SHORT TERM  GOAL #1   Title  Pt will be independent with HEP for improved strength, balance, and gait.  TARGET 11/24/2019    Time  4    Period  Weeks    Status  Revised      PT SHORT TERM GOAL #2   Title  Pt will improve TUG score to less than or equal to 13.5 seconds for decreased fall risk.    Baseline  10/26/2019:  15.13 sec    Time  4    Period  Weeks    Status  Not Met      PT SHORT TERM GOAL #3   Title  Pt will improve Berg Balance score to at least 43/56 for decreased fall risk.    Baseline  10/26/2019:  38/56; 12/07/19 43/56    Time  4    Period  Weeks    Status  Achieved      PT SHORT TERM GOAL #4   Title  Pt will improve 5x sit<>stand to less than or equal to 18 seconds for decreased fall risk.    Baseline  21.44 sec with posterior lean 10/26/2019; 13.86 sec 12/07/2019    Time  4    Period  Weeks    Status  Achieved      PT SHORT TERM GOAL #5   Title  Pt will verbalize understanding of fall prevention in home envrionment.    Time  4    Period  Weeks    Status  New  PT Long Term Goals - 12/07/19 1224      PT LONG TERM GOAL #1   Title  Pt will be independent with progression of HEP for improved balance, strength, and gait.  For all LTGs:  UPDATED TARGET 12/22/2019    Time  8    Period  Weeks    Status  On-going      PT LONG TERM GOAL #2   Title  Pt will improve TUG cognitive score to less than or equal to 15 seconds for decreased fall risk.    Baseline  19.12 sec 10/26/2019    Time  8    Period  Weeks    Status  On-going      PT LONG TERM GOAL #3   Title  Pt will improve Berg score to at least 48/56 for decreased fall risk.    Baseline  38/56 10/26/2019; 43/56 12/07/2019    Time  8    Period  Weeks    Status  On-going      PT LONG TERM GOAL #4   Title  Pt will improve gait velocity to at least 2.62 ft/sec for improved gait efficiency and safety.    Time  8    Period  Weeks    Status  On-going      PT LONG TERM GOAL #5   Title  Pt will perform at least 8 of 10 reps  of sit<>stand from 18" surfaces with no posterior lean, for improved transfers.    Time  8    Period  Weeks    Status  On-going      Additional Long Term Goals   Additional Long Term Goals  Yes      PT LONG TERM GOAL #6   Title  Pt will ambulate at least 500 ft, indoor and outdoor surfaces, for community ambulation, with appropriate assistive device and supervision.    Time  8    Period  Weeks    Status  On-going      PT LONG TERM GOAL #7   Title  Pt will report 50% less "fullness in head/dizziness" with turns and head turns, for improved balance.    Baseline  Reports symptoms as 6/10, progressing to headache with 360 turn and step taps 12/07/2019    Time  4    Period  Weeks    Status  New            Plan - 12/07/19 1216    Clinical Impression Statement  Pt returns for first visit since 11/23/2019.  Pt's visits since recert on 09/28/4980 have been sporadic and more spaced out than his recommended frequency.  Pt and wife report it has been due to changes in schedules, as wife is currently undergoing treatments and is the primary driver.  Assessed STGs this visit, with pt meeting STG for 5x sit<>stand and Berg score.  Pt is using rollator today, so TUG score not improved to goal level.  Pt reports being very off-balance lately, using the rollator all the time.  He has reports of "fullness in head" and dizziness during turns and activities on Berg test.  Briefly looked at vision and visual acuity, with pt c/o increased fullness in head and a headache with attempts with head/eye motions.  He does tend to look down at floor, and with upright posture, visual tracking, he has difficulty tracking below midline.  Will conitnue to assess potential for vestibular/visual aspects in relation to balance.  Personal Factors and Comorbidities  Comorbidity 2;Behavior Pattern   hx of falls   Comorbidities  HTN, hyperlipidemia    Examination-Activity Limitations  Locomotion  Level;Transfers;Squat;Stairs;Stand;Other    Examination-Participation Restrictions  Community Activity;Driving;Other   walking in neighborhood, exercise at fitness center   Stability/Clinical Decision Making  Evolving/Moderate complexity    PT Frequency  2x / week    PT Duration  8 weeks   per recert 01/25/8755   PT Treatment/Interventions  ADLs/Self Care Home Management;Gait training;Stair training;Functional mobility training;Therapeutic activities;Therapeutic exercise;Balance training;Neuromuscular re-education;Patient/family education;Canalith Repostioning;Vestibular    PT Next Visit Plan  Further assessment of visual/vestibular systems (due to c/o head fullness/dizziness this visit); unsure that this is BPPV, but may need further assessment; focus on balance and lower extremity strengthening (this is what is concerning patient today). Recert completed to capture dizziness/vestibular (for next 4 weeks of POC)    PT Home Exercise Plan  Access Code: [email or web address] (condensed to this access code on 11/08/19.  This code should contain all current and appropriate exercises    Consulted and Agree with Plan of Care  Patient       Patient will benefit from skilled therapeutic intervention in order to improve the following deficits and impairments:  Abnormal gait, Difficulty walking, Decreased activity tolerance, Decreased balance, Decreased mobility, Decreased strength, Postural dysfunction, Decreased safety awareness, Dizziness  Visit Diagnosis: Dizziness and giddiness  Other abnormalities of gait and mobility  Unsteadiness on feet  Muscle weakness (generalized)     Problem List Patient Active Problem List   Diagnosis Date Noted  . Concussion 10/19/2019  . Laceration of forehead 10/19/2019  . Fall 10/19/2019  . Deficiency anemia 06/01/2019  . Vitamin D deficiency 06/01/2019  . Chronic idiopathic constipation 06/01/2019  . Traumatic subdural hematoma ([place]) 04/21/2019  . Hyperglycemia  04/15/2019  . Mild intermittent asthma with acute exacerbation 02/08/2018  . Hypothyroidism 03/14/2013  . Hyperlipidemia LDL goal <70 03/14/2013  . BPH (benign prostatic hypertrophy) 03/14/2013  . Hypogonadism, male 03/14/2013  . Erectile dysfunction 03/14/2013    Frazier Butt. 12/07/2019, 12:27 PM  Frazier Butt., PT  [facility] [address] [place], [place], [address] Phone: [phone]   Fax:  [phone]  Name: JHONY ANTRIM MRN: [id number] Date of Birth: May 15, 1933

## 2019-12-07 NOTE — Progress Notes (Signed)
Subjective:  Patient ID: Benjamin Hahn, male    DOB: 06-Mar-1933  Age: 86 y.o. MRN: [id number]  CC: Anemia and Annual Exam  This visit occurred during the SARS-CoV-2 public health emergency.  Safety protocols were in place, including screening questions prior to the visit, additional usage of staff PPE, and extensive cleaning of exam room while observing appropriate contact time as indicated for disinfecting solutions.    HPI AAHIL GILE presents for a CPX.  He is with his wife today and neither one of them are very good historians.  He continues to complain of insomnia and says that his psychiatrist has prescribed Halcion but he wants to try something different.  He also complains of constipation.  He tried Linzess but says it caused bowel movements that were too frequent and loose.  He has had a few episodes of dizziness recently.  Outpatient Medications Prior to Visit  Medication Sig Dispense Refill  . acetaminophen (TYLENOL) 325 MG tablet Take 1-2 tablets (325-650 mg total) by mouth every 4 (four) hours as needed for mild pain or headache.    [name] acetaminophen (TYLENOL) 325 MG tablet Take 325-650 mg by mouth every 6 (six) hours as needed for mild pain or headache.    . ALPRAZolam (XANAX) 0.5 MG tablet     . atorvastatin (LIPITOR) 40 MG tablet Take 1 tablet (40 mg total) by mouth daily at 6 PM. 30 tablet 3  . buPROPion (WELLBUTRIN XL) 300 MG 24 hr tablet Take 1 tablet (300 mg total) by mouth daily. 30 tablet 2  . HYDROcodone-acetaminophen (NORCO) 10-325 MG tablet     . hydrOXYzine (VISTARIL) 50 MG capsule Take 1 capsule (50 mg total) by mouth at bedtime. 30 capsule 1  . imipramine (TOFRANIL) 10 MG tablet Take 10 mg by mouth at bedtime.    [name] levothyroxine (SYNTHROID) 88 MCG tablet TAKE 1 TABLET BY MOUTH DAILY. 90 tablet 1  . lidocaine (LIDODERM) 5 % Place 1 patch onto the skin daily. Remove & Discard patch within 12 hours or as directed by MD 30 patch 0  . ramipril (ALTACE) 1.25  MG capsule Take 1.25 mg by mouth daily after supper.    . venlafaxine XR (EFFEXOR-XR) 37.5 MG 24 hr capsule Take 1 capsule (37.5 mg total) by mouth daily. 30 capsule 6  . ALPRAZolam (XANAX) 0.25 MG tablet 1  qhs 30 tablet 4  . ALPRAZolam (XANAX) 0.25 MG tablet Take 0.125-0.25 mg by mouth daily as needed for anxiety.     [name] atorvastatin (LIPITOR) 40 MG tablet Take 40 mg by mouth daily after supper.    [name] buPROPion (WELLBUTRIN XL) 300 MG 24 hr tablet Take 300 mg by mouth daily.    [name] levothyroxine (SYNTHROID) 88 MCG tablet Take 88 mcg by mouth daily after supper.    . linaclotide (LINZESS) 72 MCG capsule Take 1 capsule (72 mcg total) by mouth daily before breakfast. 84 capsule 0  . metoprolol succinate (TOPROL-XL) 25 MG 24 hr tablet TAKE (1/2) TABLET DAILY. 15 tablet 0  . metoprolol succinate (TOPROL-XL) 25 MG 24 hr tablet Take 12.5 mg by mouth daily.    . metoprolol succinate (TOPROL-XL) 25 MG 24 hr tablet TAKE (1/2) TABLET DAILY. 15 tablet 0  . nitroGLYCERIN (NITROSTAT) 0.4 MG SL tablet Place 1 tablet (0.4 mg total) under the tongue every 5 (five) minutes as needed for chest pain (CP or SOB). 25 tablet 12  . nitroGLYCERIN (NITROSTAT) 0.4 MG SL tablet Place 0.4 mg  under the tongue every 5 (five) minutes as needed for chest pain.    . tamsulosin (FLOMAX) 0.4 MG CAPS capsule Take 1 capsule (0.4 mg total) by mouth daily after supper. 30 capsule 0  . tamsulosin (FLOMAX) 0.4 MG CAPS capsule Take 0.8 mg by mouth daily after breakfast.    . triazolam (HALCION) 0.25 MG tablet Take 1 tablet (0.25 mg total) by mouth at bedtime as needed for sleep. 30 tablet 2  . venlafaxine XR (EFFEXOR-XR) 37.5 MG 24 hr capsule Take 37.5 mg by mouth daily with breakfast.     No facility-administered medications prior to visit.    ROS Review of Systems  Constitutional: Negative for diaphoresis, fatigue and unexpected weight change.  HENT: Negative.   Eyes: Negative.   Respiratory: Negative for cough, chest tightness,  shortness of breath and wheezing.   Cardiovascular: Negative for chest pain, palpitations and leg swelling.  Gastrointestinal: Positive for constipation. Negative for abdominal pain, blood in stool, diarrhea, nausea and vomiting.  Endocrine: Negative.   Genitourinary: Negative.  Negative for difficulty urinating.  Musculoskeletal: Negative.  Negative for arthralgias and myalgias.  Skin: Negative.  Negative for color change and pallor.  Neurological: Positive for dizziness. Negative for weakness, light-headedness and headaches.  Hematological: Negative for adenopathy. Does not bruise/bleed easily.  Psychiatric/Behavioral: Positive for sleep disturbance. Negative for dysphoric mood and suicidal ideas. The patient is nervous/anxious.     Objective:  BP 100/70 (BP Location: Left Arm, Patient Position: Sitting, Cuff Size: Large)   Pulse (!) 57   Temp 98.2 F (36.8 C) (Oral)   Resp 16   Ht 5' 7.5" (1.715 m)   Wt 180 lb (81.6 kg)   SpO2 96%   BMI 27.78 kg/m   BP Readings from Last 3 Encounters:  12/07/19 100/70  10/19/19 126/60  10/11/19 132/78    Wt Readings from Last 3 Encounters:  12/07/19 180 lb (81.6 kg)  10/19/19 182 lb (82.6 kg)  06/01/19 180 lb 8 oz (81.9 kg)    Physical Exam Vitals reviewed.  Constitutional:      Appearance: Normal appearance.  HENT:     Nose: Nose normal.     Mouth/Throat:     Mouth: Mucous membranes are moist.  Eyes:     General: No scleral icterus. Cardiovascular:     Rate and Rhythm: Regular rhythm. Bradycardia present.     Heart sounds: Normal heart sounds, S1 normal and S2 normal. No murmur. No gallop.      Comments: EKG -  Sinus bradycardia Otherwise normal EKG Pulmonary:     Effort: Pulmonary effort is normal.     Breath sounds: No wheezing, rhonchi or rales.  Abdominal:     General: Abdomen is flat.     Palpations: There is no mass.     Tenderness: There is no abdominal tenderness. There is no guarding.  Musculoskeletal:      Cervical back: Neck supple.     Right lower leg: No edema.     Left lower leg: No edema.  Lymphadenopathy:     Cervical: No cervical adenopathy.  Skin:    General: Skin is warm and dry.     Coloration: Skin is not pale.  Neurological:     Mental Status: He is alert. Mental status is at baseline. He is disoriented.  Psychiatric:        Attention and Perception: He is inattentive.        Mood and Affect: Mood normal.  Speech: Speech is delayed and tangential.        Behavior: Behavior is cooperative.        Thought Content: Thought content normal.        Cognition and Memory: Cognition is not impaired. Memory is not impaired. He does not exhibit impaired recent memory or impaired remote memory.     Lab Results  Component Value Date   WBC 6.0 12/07/2019   HGB 10.7 (L) 12/07/2019   HCT 31.9 (L) 12/07/2019   PLT 201.0 12/07/2019   GLUCOSE 94 12/07/2019   CHOL 126 06/01/2019   TRIG 91.0 06/01/2019   HDL 56.50 06/01/2019   LDLCALC 51 06/01/2019   ALT 13 12/07/2019   AST 15 12/07/2019   NA 139 12/07/2019   K 4.4 12/07/2019   CL 104 12/07/2019   CREATININE 1.35 12/07/2019   BUN 24 (H) 12/07/2019   CO2 32 12/07/2019   TSH 3.08 12/07/2019   INR 1.1 10/11/2019   HGBA1C 5.5 04/14/2019    CT Head Wo Contrast  Result Date: 10/11/2019 CLINICAL DATA:  86 year old who fell off of a curb while at a shopping center earlier today with loss of consciousness and laceration to the LEFT eyebrow. Patient is amnestic to the event. Initial encounter. Personal history of subdural hematoma. EXAM: CT HEAD WITHOUT CONTRAST CT CERVICAL SPINE WITHOUT CONTRAST TECHNIQUE: Multidetector CT imaging of the head and cervical spine was performed following the standard protocol without intravenous contrast. Multiplanar CT image reconstructions of the cervical spine were also generated. COMPARISON:  05/31/2019 and earlier. FINDINGS: CT HEAD FINDINGS Brain: Moderate cortical, deep and cerebellar atrophy as  noted previously. No mass lesion. No midline shift. No acute hemorrhage or hematoma. No extra-axial fluid collections. No evidence of acute infarction. Dural thickening beneath the RIGHT frontal craniotomy flap. Vascular: Moderate to severe BILATERAL carotid siphon and mild BILATERAL vertebral artery atherosclerosis. No hyperdense vessel. Skull: Prior RIGHT frontal craniotomy. No skull fracture or other focal osseous abnormality involving the skull. Sinuses/Orbits: Preseptal soft tissue swelling/hematoma anterior to the LEFT orbit. No evidence of intraorbital hemorrhage. Other: None. CT CERVICAL SPINE FINDINGS Alignment: Anatomic posterior alignment. Straightening of the usual cervical lordosis. Facet joints anatomically aligned throughout with severe diffuse degenerative changes. Skull base and vertebrae: No fractures identified involving the cervical spine. Coronal reformatted images demonstrate an intact craniocervical junction, intact dens and intact lateral masses throughout. Degenerative changes at the C1-C2 articulation with calcified pannus POSTERIOR to the dens. Soft tissues and spinal canal: No evidence of paraspinous or spinal canal hematoma. No evidence of spinal stenosis. Disc levels: Severe disc space narrowing and associated endplate hypertrophic changes at C6-7. Moderate disc space narrowing at C5-6 and C7-T1. Calcification within the C2-3 disc. Combination of facet and uncinate hypertrophy account for multilevel foraminal stenoses including severe BILATERAL C3-4, moderate BILATERAL C4-5, severe BILATERAL C5-6, severe BILATERAL C6-7. Upper chest: Visualized lung apices clear. Mild atherosclerosis involving the visualized proximal great vessels. Other: DISH involving the visualized UPPER thoracic spine. BILATERAL cervical carotid atherosclerosis. IMPRESSION: 1. No acute intracranial abnormality. 2. Moderate generalized atrophy. 3. No cervical spine fractures identified. 4. Multilevel degenerative disc  disease, spondylosis and foraminal stenoses throughout the cervical spine as detailed above. 5. Preseptal soft tissue swelling/hematoma anterior to the LEFT orbit without evidence of intraorbital hemorrhage. Electronically Signed   By: Evangeline Dakin M.D.   On: 10/11/2019 18:10   CT Cervical Spine Wo Contrast  Result Date: 10/11/2019 CLINICAL DATA:  86 year old who fell off of a curb while  at a shopping center earlier today with loss of consciousness and laceration to the LEFT eyebrow. Patient is amnestic to the event. Initial encounter. Personal history of subdural hematoma. EXAM: CT HEAD WITHOUT CONTRAST CT CERVICAL SPINE WITHOUT CONTRAST TECHNIQUE: Multidetector CT imaging of the head and cervical spine was performed following the standard protocol without intravenous contrast. Multiplanar CT image reconstructions of the cervical spine were also generated. COMPARISON:  05/31/2019 and earlier. FINDINGS: CT HEAD FINDINGS Brain: Moderate cortical, deep and cerebellar atrophy as noted previously. No mass lesion. No midline shift. No acute hemorrhage or hematoma. No extra-axial fluid collections. No evidence of acute infarction. Dural thickening beneath the RIGHT frontal craniotomy flap. Vascular: Moderate to severe BILATERAL carotid siphon and mild BILATERAL vertebral artery atherosclerosis. No hyperdense vessel. Skull: Prior RIGHT frontal craniotomy. No skull fracture or other focal osseous abnormality involving the skull. Sinuses/Orbits: Preseptal soft tissue swelling/hematoma anterior to the LEFT orbit. No evidence of intraorbital hemorrhage. Other: None. CT CERVICAL SPINE FINDINGS Alignment: Anatomic posterior alignment. Straightening of the usual cervical lordosis. Facet joints anatomically aligned throughout with severe diffuse degenerative changes. Skull base and vertebrae: No fractures identified involving the cervical spine. Coronal reformatted images demonstrate an intact craniocervical junction,  intact dens and intact lateral masses throughout. Degenerative changes at the C1-C2 articulation with calcified pannus POSTERIOR to the dens. Soft tissues and spinal canal: No evidence of paraspinous or spinal canal hematoma. No evidence of spinal stenosis. Disc levels: Severe disc space narrowing and associated endplate hypertrophic changes at C6-7. Moderate disc space narrowing at C5-6 and C7-T1. Calcification within the C2-3 disc. Combination of facet and uncinate hypertrophy account for multilevel foraminal stenoses including severe BILATERAL C3-4, moderate BILATERAL C4-5, severe BILATERAL C5-6, severe BILATERAL C6-7. Upper chest: Visualized lung apices clear. Mild atherosclerosis involving the visualized proximal great vessels. Other: DISH involving the visualized UPPER thoracic spine. BILATERAL cervical carotid atherosclerosis. IMPRESSION: 1. No acute intracranial abnormality. 2. Moderate generalized atrophy. 3. No cervical spine fractures identified. 4. Multilevel degenerative disc disease, spondylosis and foraminal stenoses throughout the cervical spine as detailed above. 5. Preseptal soft tissue swelling/hematoma anterior to the LEFT orbit without evidence of intraorbital hemorrhage. Electronically Signed   By: Evangeline Dakin M.D.   On: 10/11/2019 18:10    Assessment & Plan:   Zebbie was seen today for anemia and annual exam.  Diagnoses and all orders for this visit:  Hypotension, unspecified hypotension type- His SBP is down to 98 and his pulse is down to 57.  I think this explains his dizziness.  I have asked him to stop taking the beta-blocker.  He is also mildly anemic and dehydrated.  I will screen him for vitamin deficiencies.  I recommended that he increase his intake of liquids. -     CBC with Differential/Platelet; Future -     Basic metabolic panel; Future -     Urinalysis, Routine w reflex microscopic; Future -     Hepatic function panel; Future -     Cortisol; Future -      Cortisol -     Hepatic function panel -     Urinalysis, Routine w reflex microscopic -     Basic metabolic panel -     CBC with Differential/Platelet -     EKG 12-Lead  Chronic idiopathic constipation- His labs are negative for secondary causes.  I recommended that he try plecanatide. -     Basic metabolic panel; Future -     Plecanatide (TRULANCE) 3 MG TABS;  Take 1 tablet by mouth daily. -     Basic metabolic panel  Acquired hypothyroidism- His TSH is in the normal range.  He will stay on the current dose of levothyroxine. -     TSH; Future -     TSH  Deficiency anemia- I will screen him for vitamin deficiencies.  This is likely the anemia of advanced aged. -     CBC with Differential/Platelet; Future -     Vitamin B12; Future -     IBC panel; Future -     Folate; Future -     Ferritin; Future -     Vitamin B1; Future -     Vitamin B1 -     Ferritin -     Folate -     IBC panel -     Vitamin B12 -     CBC with Differential/Platelet  Psychophysiological insomnia- I recommended that he try Lunesta for this. -     eszopiclone (LUNESTA) 1 MG TABS tablet; Take 1 tablet (1 mg total) by mouth at bedtime as needed for sleep. Take immediately before bedtime  Routine general medical examination at a health care facility- Exam completed, labs reviewed, all vaccines are up-to-date, no cancer screenings are indicated, patient education was given.   I have discontinued Cristopher Estimable. Kilmer's nitroGLYCERIN, tamsulosin, linaclotide, metoprolol succinate, triazolam, tamsulosin, metoprolol succinate, nitroGLYCERIN, and metoprolol succinate. I am also having him start on Trulance and eszopiclone. Additionally, I am having him maintain his acetaminophen, atorvastatin, lidocaine, levothyroxine, venlafaxine XR, buPROPion, ramipril, acetaminophen, hydrOXYzine, ALPRAZolam, HYDROcodone-acetaminophen, and imipramine.  Meds ordered this encounter  Medications  . Plecanatide (TRULANCE) 3 MG TABS    Sig:  Take 1 tablet by mouth daily.    Dispense:  90 tablet    Refill:  1  . eszopiclone (LUNESTA) 1 MG TABS tablet    Sig: Take 1 tablet (1 mg total) by mouth at bedtime as needed for sleep. Take immediately before bedtime    Dispense:  90 tablet    Refill:  1   In addition to time spent on CPE, I spent 50 minutes in preparing to see the patient by review of recent labs, imaging and procedures, obtaining and reviewing separately obtained history, communicating with the patient and family or caregiver, ordering medications, tests or procedures, and documenting clinical information in the EHR including the differential Dx, treatment, and any further evaluation and other management of 1. Hypotension, unspecified hypotension type 2. Chronic idiopathic constipation 3. Acquired hypothyroidism 4. Deficiency anemia 5. Psychophysiological insomnia   Follow-up: Return in about 3 weeks (around 12/28/2019).  Scarlette Calico, MD

## 2019-12-07 NOTE — Telephone Encounter (Signed)
Pharmacy called and wanted to inform us that the patient is getting a prescription from Dr. Ardis Rowan for xanax and hydroxizine.   Per Dr. Ronnald Ramp, request to withdraw the Lunesta rx and have pt to consult Dr. Ardis Rowan for all issues with insomnia and anxiety.    Pt, spouse and dtr informed of same.

## 2019-12-07 NOTE — Patient Instructions (Signed)
Bradycardia, Adult Bradycardia is a slower-than-normal heartbeat. A normal resting heart rate for an adult ranges from 60 to 100 beats per minute. With bradycardia, the resting heart rate is less than 60 beats per minute. Bradycardia can prevent enough oxygen from reaching certain areas of your body when you are active. It can be serious if it keeps enough oxygen from reaching your brain and other parts of your body. Bradycardia is not a problem for everyone. For some healthy adults, a slow resting heart rate is normal. What are the causes? This condition may be caused by:  A problem with the heart, including: ? A problem with the heart's electrical system, such as a heart block. With a heart block, electrical signals between the chambers of the heart are partially or completely blocked, so they are not able to work as they should. ? A problem with the heart's natural pacemaker (sinus node). ? Heart disease. ? A heart attack. ? Heart damage. ? Lyme disease. ? A heart infection. ? A heart condition that is present at birth (congenital heart defect).  Certain medicines that treat heart conditions.  Certain conditions, such as hypothyroidism and obstructive sleep apnea.  Problems with the balance of chemicals and other substances, like potassium, in the blood.  Trauma.  Radiation therapy. What increases the risk? You are more likely to develop this condition if you:  Are age 65 or older.  Have high blood pressure (hypertension), high cholesterol (hyperlipidemia), or diabetes.  Drink heavily, use tobacco or nicotine products, or use drugs. What are the signs or symptoms? Symptoms of this condition include:  Light-headedness.  Feeling faint or fainting.  Fatigue and weakness.  Trouble with activity or exercise.  Shortness of breath.  Chest pain (angina).  Drowsiness.  Confusion.  Dizziness. How is this diagnosed? This condition may be diagnosed based on:  Your  symptoms.  Your medical history.  A physical exam. During the exam, your health care provider will listen to your heartbeat and check your pulse. To confirm the diagnosis, your health care provider may order tests, such as:  Blood tests.  An electrocardiogram (ECG). This test records the heart's electrical activity. The test can show how fast your heart is beating and whether the heartbeat is steady.  A test in which you wear a portable device (event recorder or Holter monitor) to record your heart's electrical activity while you go about your day.  Anexercise test. How is this treated? Treatment for this condition depends on the cause of the condition and how severe your symptoms are. Treatment may involve:  Treatment of the underlying condition.  Changing your medicines or how much medicine you take.  Having a small, battery-operated device called a pacemaker implanted under the skin. When bradycardia occurs, this device can be used to increase your heart rate and help your heart beat in a regular rhythm. Follow these instructions at home: Lifestyle   Manage any health conditions that contribute to bradycardia as told by your health care provider.  Follow a heart-healthy diet. A nutrition specialist (dietitian) can help educate you about healthy food options and changes.  Follow an exercise program that is approved by your health care provider.  Maintain a healthy weight.  Try to reduce or manage your stress, such as with yoga or meditation. If you need help reducing stress, ask your health care provider.  Do not use any products that contain nicotine or tobacco, such as cigarettes, e-cigarettes, and chewing tobacco. If you need help   quitting, ask your health care provider.  Do not use illegal drugs.  Limit alcohol intake to no more than 1 drink a day for nonpregnant women and 2 drinks a day for men. Be aware of how much alcohol is in your drink. In the [place] one drink  equals one 12 oz bottle of beer (355 mL), one 5 oz glass of wine (148 mL), or one 1 oz glass of hard liquor (44 mL). General instructions  Take over-the-counter and prescription medicines only as told by your health care provider.  Keep all follow-up visits as told by your health care provider. This is important. How is this prevented? In some cases, bradycardia may be prevented by:  Treating underlying medical problems.  Stopping behaviors or medicines that can trigger the condition. Contact a health care provider if you:  Feel light-headed or dizzy.  Almost faint.  Feel weak or are easily fatigued during physical activity.  Experience confusion or have memory problems. Get help right away if:  You faint.  You have: ? An irregular heartbeat (palpitations). ? Chest pain. ? Trouble breathing. Summary  Bradycardia is a slower-than-normal heartbeat. With bradycardia, the resting heart rate is less than 60 beats per minute.  Treatment for this condition depends on the cause.  Manage any health conditions that contribute to bradycardia as told by your health care provider.  Do not use any products that contain nicotine or tobacco, such as cigarettes, e-cigarettes, and chewing tobacco, and limit alcohol intake.  Keep all follow-up visits as told by your health care provider. This is important. This information is not intended to replace advice given to you by your health care provider. Make sure you discuss any questions you have with your health care provider. Document Revised: 02/21/2018 Document Reviewed: 01/19/2018 Elsevier Patient Education  2020 [employer]  

## 2019-12-08 ENCOUNTER — Encounter: Payer: Self-pay | Admitting: Internal Medicine

## 2019-12-09 DIAGNOSIS — Z0000 Encounter for general adult medical examination without abnormal findings: Secondary | ICD-10-CM | POA: Insufficient documentation

## 2019-12-10 ENCOUNTER — Other Ambulatory Visit: Payer: Self-pay | Admitting: Internal Medicine

## 2019-12-10 LAB — VITAMIN B1: Vitamin B1 (Thiamine): 20 nmol/L (ref 8–30)

## 2019-12-12 ENCOUNTER — Ambulatory Visit: Payer: BLUE CROSS/BLUE SHIELD | Admitting: Physical Therapy

## 2019-12-13 NOTE — Telephone Encounter (Signed)
Trulance Key: [employer]

## 2019-12-14 ENCOUNTER — Ambulatory Visit: Payer: BLUE CROSS/BLUE SHIELD | Admitting: Physical Therapy

## 2019-12-15 ENCOUNTER — Telehealth (HOSPITAL_COMMUNITY): Payer: Self-pay | Admitting: *Deleted

## 2019-12-15 ENCOUNTER — Ambulatory Visit: Payer: Medicare Other | Admitting: Physical Therapy

## 2019-12-15 DIAGNOSIS — S065X9A Traumatic subdural hemorrhage with loss of consciousness of unspecified duration, initial encounter: Secondary | ICD-10-CM | POA: Diagnosis not present

## 2019-12-15 DIAGNOSIS — Z9889 Other specified postprocedural states: Secondary | ICD-10-CM | POA: Diagnosis not present

## 2019-12-15 NOTE — Telephone Encounter (Signed)
Pt called stating he was in "a crisis" wanting to speak with Dr. Casimiro Needle. Writer returned pt call and was told that pt has not been sleeping due to thinking about a "giel from 50 year ago". Pt states he's been taking Xanax 0.71mdg to 1mg  at hs in order to sleep. Pt denies s.i. Writer spoke with Dr. Casimiro Needle who will contact pt on MD next clinic day on 12/19/19. Pt does have an appointment upcoming on 12/20/19. Pt verbalized understanding and agreed to speak to MD next week. Pt encouraged to call clinic with any questions or concerns prior to seeing Dr. Casimiro Needle.

## 2019-12-19 ENCOUNTER — Encounter: Payer: Self-pay | Admitting: Physical Therapy

## 2019-12-19 ENCOUNTER — Other Ambulatory Visit: Payer: Self-pay

## 2019-12-19 ENCOUNTER — Ambulatory Visit: Payer: Medicare Other | Admitting: Physical Therapy

## 2019-12-19 DIAGNOSIS — M6281 Muscle weakness (generalized): Secondary | ICD-10-CM

## 2019-12-19 DIAGNOSIS — R2681 Unsteadiness on feet: Secondary | ICD-10-CM | POA: Diagnosis not present

## 2019-12-19 DIAGNOSIS — R2689 Other abnormalities of gait and mobility: Secondary | ICD-10-CM | POA: Diagnosis not present

## 2019-12-19 DIAGNOSIS — R42 Dizziness and giddiness: Secondary | ICD-10-CM | POA: Diagnosis not present

## 2019-12-19 NOTE — Therapy (Signed)
[facility] [address] [place], [place], [address] Phone: [phone]   Fax:  [phone]  Physical Therapy Treatment  Patient Details  Name: Benjamin Hahn MRN: [id number] Date of Birth: [birth year] Referring Provider (PT): Jovita Gamma   Encounter Date: 12/19/2019  PT End of Session - 12/19/19 1431    Visit Number  18    Number of Visits  29    Date for PT Re-Evaluation  93/79/02   per recert 4/0/9735-32 day recert for 8 wk POC recert   Authorization Type  Medicare/BCBS-Will need 10th visit progress note    PT Start Time  0849    PT Stop Time  0932    PT Time Calculation (min)  43 min    Activity Tolerance  Patient tolerated treatment well   LImited by dizziness, head fullness, progression to headache   Behavior During Therapy  [facility] for tasks assessed/performed       Past Medical History:  Diagnosis Date  . Anxiety   . Arthritis    might be in back, no problems  . BPH (benign prostatic hyperplasia)   . Depression 1990s  . GERD (gastroesophageal reflux disease)    occasional  . Hypercholesteremia    controled  . Hypothyroid   . MI (myocardial infarction) ([place])   . Shingles May 2014   "Right face, still has some"  . Temporal arteritis ([place])   . Transient blindness of both eyes     Past Surgical History:  Procedure Laterality Date  . ARTERY BIOPSY Right 06/20/2013   Procedure: BIOPSY TEMPORAL ARTERY RIGHT;  Surgeon: Earnstine Regal, MD;  Location: [facility];  Service: General;  Laterality: Right;  . CARDIAC CATHETERIZATION N/A 09/02/2015   Procedure: Left Heart Cath and Coronary Angiography;  Surgeon: Charolette Forward, MD;  Location: [facility];  Service: Cardiovascular;  Laterality: N/A;  . CARDIAC CATHETERIZATION N/A 09/02/2015   Procedure: Coronary Stent Intervention;  Surgeon: Charolette Forward, MD;  Location: [facility];  Service: Cardiovascular;  Laterality: N/A;  1.  mid RCA       (3.0/28mm Xience) 2.  Mid LAD      (3.0/23mm Xience)  . CRANIOTOMY N/A 04/19/2019   Procedure: CRANIOTOMY HEMATOMA EVACUATION SUBDURAL;  Surgeon: Jovita Gamma, MD;  Location: [facility];  Service: Neurosurgery;  Laterality: N/A;  CRANIOTOMY HEMATOMA EVACUATION SUBDURAL  . None      There were no vitals filed for this visit.  Subjective Assessment - 12/19/19 0853    Subjective  Didn't bring in my walker today.  Did not have any falls.    Patient is accompained by:  Family member   daughter brought to clinic   Patient Stated Goals  Pt's goals are to improve walking, balance, strength.  Would like to get back to driving.    Currently in Pain?  No/denies         Access Code: [email or web address] URL: [email or web address] Date: 12/19/2019 Prepared by: Mady Haagensen   Reviewed Therapeutic Exercise from HEP: -Seated Hamstring Stretch with Strap - 3 x daily - 7 x weekly - 3 sets - 60 seconds hold (performed x 1 rep, 30 sec for gastroc stretch, as pt performs at home)-cues to not bounce during stretch and for stretch to be gentle -Seated Hamstring Stretch - 2-3 x daily - 7 x weekly - 3 reps - 1 sets - 30 sec hold (performed x 2 reps, 30 sec, cues for comfortable stretch, not painful) Seated Toe  Raise - 1-2 x daily - 7 x weekly - 2 sets - 10 reps - 3 second hold Proper Sit to Stand Technique - 1 x daily - 5 x weekly - 2 sets - 5-10 reps (performed from mat x 5 reps, from chair x 5 reps)  Neuro Re-education (Reviewed as part of his HEP)   Staggered Stance Forward Backward Weight Shift with Counter Support - 1 x daily - 5 x weekly - 10 reps - 1-2 sets Alternating Heel Raises - 1 x daily - 5 x weekly - 10 reps - 1-2 sets Correct Standing Posture - 2 x daily - 7 x weekly - 3 sets - 60 seconds hold (pt performs at wall, rollator locked in front, 30-60 seconds, 3 reps)                [facility] Adult PT Treatment/Exercise - 12/19/19 0001      Ambulation/Gait   Ambulation/Gait  Yes     Ambulation/Gait Assistance  4: Min guard    Ambulation/Gait Assistance Details  Pt with overall improved slowed pace with gait.    Ambulation Distance (Feet)  345 Feet   50 ft x 4   Assistive device  Rollator    Gait Pattern  Step-through pattern;Decreased step length - right;Decreased step length - left;Narrow base of support;Trunk flexed;Shuffle;Step-to pattern;Poor foot clearance - left;Poor foot clearance - right    Ambulation Surface  Level;Indoor    Gait Comments  At end of session, cues for heelstrike, increased step length      Knee/Hip Exercises: Seated   Long Arc Quad  Strengthening;Left;Right;1 set;5 reps    Other Seated Knee/Hip Exercises  Seated ankle pumps, 2 sets x 10 reps    Marching  Strengthening;Right;Left;10 reps;2 sets   Cues for upright posture         Balance Exercises - 12/19/19 1427      Balance Exercises: Standing   Other Standing Exercises  Standing at counter-step and weightshift to R side x 10, L side x 10; pt needs physical assistance for increased weightshift to L side.  Back step and weightshift at counter, x 10 reps each side, cues for technique and deliberate return to midline.  Additional standing exercises, review of HEP        PT Education - 12/19/19 1430    Education Details  Reprinted pt's HEP and reviewed and requested pt perform daily; recommended he use rollator at all times    Person(s) Educated  Patient    Methods  Explanation;Demonstration;Handout    Comprehension  Verbalized understanding;Returned demonstration;Verbal cues required       PT Short Term Goals - 12/07/19 0859      PT SHORT TERM GOAL #1   Title  Pt will be independent with HEP for improved strength, balance, and gait.  TARGET 11/24/2019    Time  4    Period  Weeks    Status  Revised      PT SHORT TERM GOAL #2   Title  Pt will improve TUG score to less than or equal to 13.5 seconds for decreased fall risk.    Baseline  10/26/2019:  15.13 sec    Time  4    Period   Weeks    Status  Not Met      PT SHORT TERM GOAL #3   Title  Pt will improve Berg Balance score to at least 43/56 for decreased fall risk.    Baseline  10/26/2019:  38/56;  12/07/19 43/56    Time  4    Period  Weeks    Status  Achieved      PT SHORT TERM GOAL #4   Title  Pt will improve 5x sit<>stand to less than or equal to 18 seconds for decreased fall risk.    Baseline  21.44 sec with posterior lean 10/26/2019; 13.86 sec 12/07/2019    Time  4    Period  Weeks    Status  Achieved      PT SHORT TERM GOAL #5   Title  Pt will verbalize understanding of fall prevention in home envrionment.    Time  4    Period  Weeks    Status  New        PT Long Term Goals - 12/19/19 1439      PT LONG TERM GOAL #1   Title  Pt will be independent with progression of HEP for improved balance, strength, and gait.  For all LTGs:  UPDATED TARGET5/21/2021 (extended due to weeks remain in POC)    Time  8    Period  Weeks    Status  On-going      PT LONG TERM GOAL #2   Title  Pt will improve TUG cognitive score to less than or equal to 15 seconds for decreased fall risk.    Baseline  19.12 sec 10/26/2019    Time  8    Period  Weeks    Status  On-going      PT LONG TERM GOAL #3   Title  Pt will improve Berg score to at least 48/56 for decreased fall risk.    Baseline  38/56 10/26/2019; 43/56 12/07/2019    Time  8    Period  Weeks    Status  On-going      PT LONG TERM GOAL #4   Title  Pt will improve gait velocity to at least 2.62 ft/sec for improved gait efficiency and safety.    Time  8    Period  Weeks    Status  On-going      PT LONG TERM GOAL #5   Title  Pt will perform at least 8 of 10 reps of sit<>stand from 18" surfaces with no posterior lean, for improved transfers.    Time  8    Period  Weeks    Status  On-going      PT LONG TERM GOAL #6   Title  Pt will ambulate at least 500 ft, indoor and outdoor surfaces, for community ambulation, with appropriate assistive device and supervision.     Time  8    Period  Weeks    Status  On-going      PT LONG TERM GOAL #7   Title  Pt will report 50% less "fullness in head/dizziness" with turns and head turns, for improved balance.    Baseline  Reports symptoms as 6/10, progressing to headache with 360 turn and step taps 12/07/2019    Time  4    Period  Weeks    Status  New            Plan - 12/19/19 1431    Clinical Impression Statement  Skilled PT session today focused on review of HEP (PT reprinted and had pt perform, as pt reports for multiple exercises, that he hasn't ever done them before).  Pt able to perform with cues and instructed to perform these exercises daily.  No c/o fullness  or dizziness today during session.  With step/weigthshifting to the L side, pt demo decreased LLE weigthshifting, needing assist to facilitate.  He will continue to benefit from further skilled PT to address balance and gait for decreased fall risk.    Personal Factors and Comorbidities  Comorbidity 2;Behavior Pattern   hx of falls   Comorbidities  HTN, hyperlipidemia    Examination-Activity Limitations  Locomotion Level;Transfers;Squat;Stairs;Stand;Other    Examination-Participation Restrictions  Community Activity;Driving;Other   walking in neighborhood, exercise at fitness center   Stability/Clinical Decision Making  Evolving/Moderate complexity    PT Frequency  2x / week    PT Duration  8 weeks   per recert 11/28/3956   PT Treatment/Interventions  ADLs/Self Care Home Management;Gait training;Stair training;Functional mobility training;Therapeutic activities;Therapeutic exercise;Balance training;Neuromuscular re-education;Patient/family education;Canalith Repostioning;Vestibular    PT Next Visit Plan  REview HEP and WORK ON BALANCE; Further assessment of visual/vestibular systems (due to c/o head fullness/dizziness 4/15 visit) as needed; unsure that this is BPPV, but may need further assessment; focus on balance and lower extremity strengthening  (this is what is concerning patient today). Functional strengthening gait in addition to balance.    PT Home Exercise Plan  Access Code: [email or web address] (condensed to this access code on 11/08/19.  This code should contain all current and appropriate exercises    Consulted and Agree with Plan of Care  Patient       Patient will benefit from skilled therapeutic intervention in order to improve the following deficits and impairments:  Abnormal gait, Difficulty walking, Decreased activity tolerance, Decreased balance, Decreased mobility, Decreased strength, Postural dysfunction, Decreased safety awareness, Dizziness  Visit Diagnosis: Other abnormalities of gait and mobility  Unsteadiness on feet  Muscle weakness (generalized)     Problem List Patient Active Problem List   Diagnosis Date Noted  . Routine general medical examination at a health care facility 12/09/2019  . Hypotension 12/07/2019  . Psychophysiological insomnia 12/07/2019  . Deficiency anemia 06/01/2019  . Vitamin D deficiency 06/01/2019  . Chronic idiopathic constipation 06/01/2019  . Hyperglycemia 04/15/2019  . Mild intermittent asthma with acute exacerbation 02/08/2018  . Hypothyroidism 03/14/2013  . Hyperlipidemia LDL goal <70 03/14/2013  . BPH (benign prostatic hypertrophy) 03/14/2013  . Hypogonadism, male 03/14/2013  . Erectile dysfunction 03/14/2013    Frazier Butt. 12/19/2019, 2:40 PM  Frazier Butt., PT  [facility] [address] [place], [place], [address] Phone: [phone]   Fax:  [phone]  Name: MONNIE ANSPACH MRN: [id number] Date of Birth: [birth year]

## 2019-12-19 NOTE — Patient Instructions (Addendum)
Access Code: [email or web address] URL: [email or web address] Date: 12/19/2019 Prepared by: Mady Haagensen  Exercises Seated Hamstring Stretch with Strap - 3 x daily - 7 x weekly - 3 sets - 60 seconds hold Seated Hamstring Stretch - 2-3 x daily - 7 x weekly - 3 reps - 1 sets - 30 sec hold Seated Toe Raise - 1-2 x daily - 7 x weekly - 2 sets - 10 reps - 3 second hold Proper Sit to Stand Technique - 1 x daily - 5 x weekly - 2 sets - 5-10 reps Staggered Stance Forward Backward Weight Shift with Counter Support - 1 x daily - 5 x weekly - 10 reps - 1-2 sets Alternating Heel Raises - 1 x daily - 5 x weekly - 10 reps - 1-2 sets Correct Standing Posture - 2 x daily - 7 x weekly - 3 sets - 60 seconds hold   REPRINTED HEP AND ADVISED PT TO PERFORM THE ABOVE EXERCISES DAILY

## 2019-12-20 ENCOUNTER — Telehealth (INDEPENDENT_AMBULATORY_CARE_PROVIDER_SITE_OTHER): Payer: Medicare Other | Admitting: Psychiatry

## 2019-12-20 DIAGNOSIS — F330 Major depressive disorder, recurrent, mild: Secondary | ICD-10-CM | POA: Diagnosis not present

## 2019-12-20 DIAGNOSIS — G4700 Insomnia, unspecified: Secondary | ICD-10-CM

## 2019-12-20 MED ORDER — BUPROPION HCL ER (XL) 300 MG PO TB24: 300.0000 mg | ORAL_TABLET | Freq: Every day | ORAL | 2 refills | Status: DC

## 2019-12-20 MED ORDER — VENLAFAXINE HCL ER 37.5 MG PO CP24: 37.5000 mg | ORAL_CAPSULE | Freq: Every day | ORAL | 6 refills | Status: DC

## 2019-12-20 MED ORDER — TEMAZEPAM 15 MG PO CAPS: 15.0000 mg | ORAL_CAPSULE | Freq: Every evening | ORAL | 0 refills | Status: DC | PRN

## 2019-12-20 MED ORDER — ALPRAZOLAM 0.5 MG PO TABS: 0.5000 mg | ORAL_TABLET | Freq: Every evening | ORAL | 4 refills | Status: DC | PRN

## 2019-12-20 NOTE — Progress Notes (Signed)
Patient ID: Benjamin Hahn, male   DOB: Apr 25, 1933, 86 y.o.   MRN: [id number] [facility] MD/PA/NP OP Progress Note  12/20/2019 2:23 PM Benjamin Hahn  MRN:  [id number]  Chief Complaint: Major depression remission Subjective: Doing great  Today the patient is not doing well.  I think it is evident that he has had changes in his brain.  He describes having brain bleed and having neurosurgery.  Patient presently is in physical therapy.  Patient's wife seems very different.  No longer has any customers in his walker.  His wife takes him to the office and essentially drops him off and he does very little.  On the other hand the patient says it does not feel depressed.  He says the Effexor and Wellbutrin in the morning has helped a great deal.  He had some issues with sleep.  For reasons that are not clear somebody has been prescribing temazepam.  We will take care of his prescriptions for these agents by getting him Xanax 25 mg every other night.  The nights he does not take Xanax will take Restoril 15 mg.  He no longer takes imipramine he no longer takes Hydroxacen.  The patient is very little.  I think a combination of isolation from the pandemic, retired and be cognitively limited by cerebrovascular events has left this patient is very desperate and lonely.  I do not believe he is suicidal.  He certainly is not psychotic.  He drinks no alcohol and uses no drugs.  Past Medical History:  Diagnosis Date  . Anxiety   . Arthritis    might be in back, no problems  . BPH (benign prostatic hyperplasia)   . Depression 1990s  . GERD (gastroesophageal reflux disease)    occasional  . Hypercholesteremia    controled  . Hypothyroid   . MI (myocardial infarction) ([place])   . Shingles May 2014   "Right face, still has some"  . Temporal arteritis ([place])   . Transient blindness of both eyes     Past Surgical History:  Procedure Laterality Date  . ARTERY BIOPSY Right 06/20/2013   Procedure: BIOPSY TEMPORAL  ARTERY RIGHT;  Surgeon: Earnstine Regal, MD;  [facility];  Service: General;  Laterality: Right;  . CARDIAC CATHETERIZATION N/A 09/02/2015   Procedure: Left Heart Cath and Coronary Angiography;  Surgeon: Charolette Forward, MD;  Location: [place] [facility];  Service: Cardiovascular;  Laterality: N/A;  . CARDIAC CATHETERIZATION N/A 09/02/2015   Procedure: Coronary Stent Intervention;  Surgeon: Charolette Forward, MD;  Location: [place] [facility];  Service: Cardiovascular;  Laterality: N/A;  1.  mid RCA      (3.0/28mm Xience) 2.  Mid LAD      (3.0/23mm Xience)  . CRANIOTOMY N/A 04/19/2019   Procedure: CRANIOTOMY HEMATOMA EVACUATION SUBDURAL;  Surgeon: Jovita Gamma, MD;  Location: [place];  Service: Neurosurgery;  Laterality: N/A;  CRANIOTOMY HEMATOMA EVACUATION SUBDURAL  . None      Family Psychiatric History:   Family History:  Family History  Problem Relation Age of Onset  . Stroke Father        Died, 80s  . Stroke Sister        Living, 55  . Heart disease Mother        Died, 42  . Healthy Daughter   . Diabetes Maternal Grandmother   . Hypertension Neg Hx     Social History:  Social History   Socioeconomic History  . Marital status:  Married    Spouse name: Not on file  . Number of children: Not on file  . Years of education: Not on file  . Highest education level: Not on file  Occupational History  . Not on file  Tobacco Use  . Smoking status: Former Smoker    Types: Cigarettes    Quit date: 08/24/1966    Years since quitting: 53.3  . Smokeless tobacco: Never Used  Substance and Sexual Activity  . Alcohol use: Not Currently    Comment: socially  . Drug use: Never  . Sexual activity: Not on file  Other Topics Concern  . Not on file  Social History Narrative   ** Merged History Encounter **       Currently works as a Midwife.  Lives with wife and they have two healthy daughters.   Social Determinants of Health   Financial Resource Strain:   . Difficulty of  Paying Living Expenses:   Food Insecurity:   . Worried About Charity fundraiser in the Last Year:   . Arboriculturist in the Last Year:   Transportation Needs:   . Film/video editor (Medical):   [name] Lack of Transportation (Non-Medical):   Physical Activity:   . Days of Exercise per Week:   . Minutes of Exercise per Session:   Stress:   . Feeling of Stress :   Social Connections:   . Frequency of Communication with Friends and Family:   . Frequency of Social Gatherings with Friends and Family:   . Attends Religious Services:   . Active Member of Clubs or Organizations:   . Attends Archivist Meetings:   [name] Marital Status:     Allergies:  Allergies  Allergen Reactions  . Ativan [Lorazepam] Anxiety    Per other chart in Epic  . Lorazepam Anxiety    Metabolic Disorder Labs: Lab Results  Component Value Date   HGBA1C 5.5 04/14/2019   No results found for: PROLACTIN Lab Results  Component Value Date   CHOL 126 06/01/2019   TRIG 91.0 06/01/2019   HDL 56.50 06/01/2019   CHOLHDL 2 06/01/2019   VLDL 18.2 06/01/2019   LDLCALC 51 06/01/2019   LDLCALC 52 07/28/2018     Current Medications: Current Outpatient Medications  Medication Sig Dispense Refill  . acetaminophen (TYLENOL) 325 MG tablet Take 1-2 tablets (325-650 mg total) by mouth every 4 (four) hours as needed for mild pain or headache.    [name] acetaminophen (TYLENOL) 325 MG tablet Take 325-650 mg by mouth every 6 (six) hours as needed for mild pain or headache.    . ALPRAZolam (XANAX) 0.5 MG tablet     . atorvastatin (LIPITOR) 40 MG tablet Take 1 tablet (40 mg total) by mouth daily at 6 PM. 30 tablet 3  . buPROPion (WELLBUTRIN XL) 300 MG 24 hr tablet Take 1 tablet (300 mg total) by mouth daily. 30 tablet 2  . HYDROcodone-acetaminophen (NORCO) 10-325 MG tablet     . hydrOXYzine (VISTARIL) 50 MG capsule Take 1 capsule (50 mg total) by mouth at bedtime. 30 capsule 1  . levothyroxine (SYNTHROID) 88 MCG tablet  TAKE 1 TABLET BY MOUTH DAILY. 90 tablet 1  . lidocaine (LIDODERM) 5 % Place 1 patch onto the skin daily. Remove & Discard patch within 12 hours or as directed by MD 30 patch 0  . Plecanatide (TRULANCE) 3 MG TABS Take 1 tablet by mouth daily. 90 tablet 1  . ramipril (  ALTACE) 1.25 MG capsule Take 1.25 mg by mouth daily after supper.    . venlafaxine XR (EFFEXOR-XR) 37.5 MG 24 hr capsule Take 1 capsule (37.5 mg total) by mouth daily. 30 capsule 6   No current facility-administered medications for this visit.    Neurologic: Headache: No Seizure: No Paresthesias: No  Musculoskeletal: Strength & Muscle Tone: within normal limits Gait & Station: normal Patient leans: NA  Psychiatric Specialty Exam: ROS  There were no vitals taken for this visit.There is no height or weight on file to calculate BMI.  General Appearance: Fairly Groomed  Eye Contact:  Good  Speech:  Clear and Coherent  Volume:  Normal  Mood:  Euthymic  Affect:  Appropriate  Thought Process:  Coherent  Orientation:  Full (Time, Place, and Person)  Thought Content:  WDL  Suicidal Thoughts:  No  Homicidal Thoughts:  No  Memory:  NA  Judgement:  Good  Insight:  Good  Psychomotor Activity:  Normal  Concentration:  Good  Recall:  Good  Fund of Knowledge: Good  Language: Good  Akathisia:  No  Handed:  Right  AIMS (if indicated):    Assets:  Desire for Improvement  ADL's:  Intact  Cognition: WNL  Sleep:      Treatment/plan  This patient is #1 problem is major depression.  He seems to be doing fairly well taking Effexor 37.5 mg together with Wellbutrin 150 mg both in the morning.  His second problem is that of insomnia.  The patient will switch off between Xanax 0.5 and Restoril 15 mg.  With this combination he says he sleeps well.  He says he is eating fairly well.  He goes to physical therapy regularly.  The patient has some fixation on an old girlfriend but generally this patient has minimal psychiatric  symptomatology.  I think he is just trying to exhaust after having significant brain injuries.  Patient was given a telephone number to reach me for emergencies.  Patient should return to see me in approximately 6 to 7 weeks.  12/20/2019, 2:23 PM [facility] MD Progress Note  12/20/2019 2:23 PM Benjamin Hahn  MRN:  [id number] Subjective:  Feels well Principal Problem: Major depression, chronic, mild/residual Diagnosis:  Major depression, recurrent residual Today the patient is doing fairly well. He's been having some medical problems. He recently went for Thanksgiving to a length but unfortunately had a urinary tract infection. He also since then has been coughing and recently started on antibiotic. Emotionally he is fairly stable. He denies daily depression. He stays very active at work. He's been unable to exercise. The patient is sleeping well and has an increase in his appetite. His energy is good. Is no problems thinking or concentrating. He is recently having a productive cough but is no shortness of breath. He denies any chest pain. Is a stable relationship with his wife. His kidneys are good. He has 4 grandchildren. Everybody in his home is good. He only issue is that a number mornings he awakens and he feels oversedated. It occurs point of his wife feels uncomfortable with him driving will taken to work. Today reviewed his medications and is evident that he likely needs reduction. He also notes that his memory has change in his and is good. 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 Patient Active Problem List   Diagnosis Date Noted  . Routine general medical examination at a health care facility [Z00.00]  12/09/2019  . Hypotension [I95.9] 12/07/2019  . Psychophysiological insomnia [F51.04] 12/07/2019  . Deficiency anemia [D53.9] 06/01/2019  . Vitamin D deficiency [E55.9] 06/01/2019  . Chronic idiopathic constipation [K59.04] 06/01/2019  .  Hyperglycemia [R73.9] 04/15/2019  . Mild intermittent asthma with acute exacerbation [J45.21] 02/08/2018  . Hypothyroidism [E03.9] 03/14/2013  . Hyperlipidemia LDL goal <70 [E78.5] 03/14/2013  . BPH (benign prostatic hypertrophy) [N40.0] 03/14/2013  . Hypogonadism, male [E29.1] 03/14/2013  . Erectile dysfunction [N52.9] 03/14/2013   Total Time spent with patient: 30 minutes  Past Psychiatric History:   Past Medical History:  Past Medical History:  Diagnosis Date  . Anxiety   . Arthritis    might be in back, no problems  . BPH (benign prostatic hyperplasia)   . Depression 1990s  . GERD (gastroesophageal reflux disease)    occasional  . Hypercholesteremia    controled  . Hypothyroid   . MI (myocardial infarction) ([place])   . Shingles May 2014   "Right face, still has some"  . Temporal arteritis ([place])   . Transient blindness of both eyes     Past Surgical History:  Procedure Laterality Date  . ARTERY BIOPSY Right 06/20/2013   Procedure: BIOPSY TEMPORAL ARTERY RIGHT;  Surgeon: Earnstine Regal, MD;  [facility];  Service: General;  Laterality: Right;  . CARDIAC CATHETERIZATION N/A 09/02/2015   Procedure: Left Heart Cath and Coronary Angiography;  Surgeon: Charolette Forward, MD;  Location: [place] [facility];  Service: Cardiovascular;  Laterality: N/A;  . CARDIAC CATHETERIZATION N/A 09/02/2015   Procedure: Coronary Stent Intervention;  Surgeon: Charolette Forward, MD;  Location: [place] [facility];  Service: Cardiovascular;  Laterality: N/A;  1.  mid RCA      (3.0/28mm Xience) 2.  Mid LAD      (3.0/23mm Xience)  . CRANIOTOMY N/A 04/19/2019   Procedure: CRANIOTOMY HEMATOMA EVACUATION SUBDURAL;  Surgeon: Jovita Gamma, MD;  Location: [place];  Service: Neurosurgery;  Laterality: N/A;  CRANIOTOMY HEMATOMA EVACUATION SUBDURAL  . None     Family History:  Family History  Problem Relation Age of Onset  . Stroke Father        Died, 80s  . Stroke Sister        Living, 24  . Heart disease  Mother        Died, 31  . Healthy Daughter   . Diabetes Maternal Grandmother   . Hypertension Neg Hx    Family Psychiatric  History:  Social History:  Social History   Substance and Sexual Activity  Alcohol Use Not Currently   Comment: socially     Social History   Substance and Sexual Activity  Drug Use Never    Social History   Socioeconomic History  . Marital status: Married    Spouse name: Not on file  . Number of children: Not on file  . Years of education: Not on file  . Highest education level: Not on file  Occupational History  . Not on file  Tobacco Use  . Smoking status: Former Smoker    Types: Cigarettes    Quit date: 08/24/1966    Years since quitting: 53.3  . Smokeless tobacco: Never Used  Substance and Sexual Activity  . Alcohol use: Not Currently    Comment: socially  . Drug use: Never  . Sexual activity: Not on file  Other Topics Concern  . Not on file  Social History Narrative   ** Merged History Encounter **  Currently works as a Midwife.  Lives with wife and they have two healthy daughters.   Social Determinants of Health   Financial Resource Strain:   . Difficulty of Paying Living Expenses:   Food Insecurity:   . Worried About Charity fundraiser in the Last Year:   . Arboriculturist in the Last Year:   Transportation Needs:   . Film/video editor (Medical):   [name] Lack of Transportation (Non-Medical):   Physical Activity:   . Days of Exercise per Week:   . Minutes of Exercise per Session:   Stress:   . Feeling of Stress :   Social Connections:   . Frequency of Communication with Friends and Family:   . Frequency of Social Gatherings with Friends and Family:   . Attends Religious Services:   . Active Member of Clubs or Organizations:   . Attends Archivist Meetings:   [name] Marital Status:    Additional Social History:                         Sleep: Good  Appetite:  Good  Current  Medications: Current Outpatient Medications  Medication Sig Dispense Refill  . acetaminophen (TYLENOL) 325 MG tablet Take 1-2 tablets (325-650 mg total) by mouth every 4 (four) hours as needed for mild pain or headache.    [name] acetaminophen (TYLENOL) 325 MG tablet Take 325-650 mg by mouth every 6 (six) hours as needed for mild pain or headache.    . ALPRAZolam (XANAX) 0.5 MG tablet     . atorvastatin (LIPITOR) 40 MG tablet Take 1 tablet (40 mg total) by mouth daily at 6 PM. 30 tablet 3  . buPROPion (WELLBUTRIN XL) 300 MG 24 hr tablet Take 1 tablet (300 mg total) by mouth daily. 30 tablet 2  . HYDROcodone-acetaminophen (NORCO) 10-325 MG tablet     . hydrOXYzine (VISTARIL) 50 MG capsule Take 1 capsule (50 mg total) by mouth at bedtime. 30 capsule 1  . levothyroxine (SYNTHROID) 88 MCG tablet TAKE 1 TABLET BY MOUTH DAILY. 90 tablet 1  . lidocaine (LIDODERM) 5 % Place 1 patch onto the skin daily. Remove & Discard patch within 12 hours or as directed by MD 30 patch 0  . Plecanatide (TRULANCE) 3 MG TABS Take 1 tablet by mouth daily. 90 tablet 1  . ramipril (ALTACE) 1.25 MG capsule Take 1.25 mg by mouth daily after supper.    . venlafaxine XR (EFFEXOR-XR) 37.5 MG 24 hr capsule Take 1 capsule (37.5 mg total) by mouth daily. 30 capsule 6   No current facility-administered medications for this visit.    Lab Results:  No results found for this or any previous visit (from the past 48 hour(s)).  Physical Findings: AIMS:  , ,  ,  ,    CIWA:    COWS:     Musculoskeletal: Strength & Muscle Tone: within normal limits Gait & Station: normal Patient leans: Right  Psychiatric Specialty Exam: ROS  There were no vitals taken for this visit.There is no height or weight on file to calculate BMI.  General Appearance: Negative  Eye Contact::  Good  Speech:  Clear and Coherent  Volume:  Normal  Mood:  NA  Affect:  Appropriate  Thought Process:  Coherent  Orientation:  Full (Time, Place, and Person)   Thought Content:  WDL  Suicidal Thoughts:  No  Homicidal Thoughts:  No  Memory:  NA  Judgement:  Good  Insight:  Good  Psychomotor Activity:  Normal  Concentration:  Good  Recall:  Good  Fund of Knowledge:Good  Language: Good  Akathisia:  No  Handed:  Right  AIMS (if indicated):     Assets:  Desire for Improvement  ADL's:  Intact  Cognition: WNL  Sleep:      Treatment [facility]. [facility] MD Progress Note  12/20/2019 2:23 PM Benjamin Hahn  MRN:  [id number] Subjective:  Feels well Principal Problem: Major depression, chronic, mild/residual Diagnosis:  Major depression, recurrent residual Today the patient is doing fairly well. He's been having some medical problems. He recently went for Thanksgiving to a length but unfortunately had a urinary tract infection. He also since then has been coughing and recently started on antibiotic. Emotionally he is fairly stable. He denies daily depression. He stays very active at work. He's been unable to exercise. The patient is sleeping well and has an increase in his appetite. His energy is good. Is no problems thinking or concentrating. He is recently having a productive cough but is no shortness of breath. He denies any chest pain. Is a stable relationship with his wife. His kidneys are good. He has 4 grandchildren. Everybody in his home is good. He only issue is that a number mornings he awakens and he feels oversedated. It occurs point of his wife feels uncomfortable with him driving will taken to work. Today reviewed his medications and is evident that he likely needs reduction. He also notes that his memory has change in his and is good. 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 Patient Active Problem List   Diagnosis Date Noted  . Routine general medical examination at a health care facility [Z00.00] 12/09/2019  . Hypotension [I95.9] 12/07/2019  . Psychophysiological insomnia [F51.04]  12/07/2019  . Deficiency anemia [D53.9] 06/01/2019  . Vitamin D deficiency [E55.9] 06/01/2019  . Chronic idiopathic constipation [K59.04] 06/01/2019  . Hyperglycemia [R73.9] 04/15/2019  . Mild intermittent asthma with acute exacerbation [J45.21] 02/08/2018  . Hypothyroidism [E03.9] 03/14/2013  . Hyperlipidemia LDL goal <70 [E78.5] 03/14/2013  . BPH (benign prostatic hypertrophy) [N40.0] 03/14/2013  . Hypogonadism, male [E29.1] 03/14/2013  . Erectile dysfunction [N52.9] 03/14/2013   Total Time spent with patient: 30 minutes  Past Psychiatric History:   Past Medical History:  Past Medical History:  Diagnosis Date  . Anxiety   . Arthritis    might be in back, no problems  . BPH (benign prostatic hyperplasia)   . Depression 1990s  . GERD (gastroesophageal reflux disease)    occasional  . Hypercholesteremia    controled  . Hypothyroid   . MI (myocardial infarction) ([place])   . Shingles May 2014   "Right face, still has some"  . Temporal arteritis ([place])   . Transient blindness of both eyes     Past Surgical History:  Procedure Laterality Date  . ARTERY BIOPSY Right 06/20/2013   Procedure: BIOPSY TEMPORAL ARTERY RIGHT;  Surgeon: Earnstine Regal, MD;  [facility];  Service: General;  Laterality: Right;  . CARDIAC CATHETERIZATION N/A 09/02/2015   Procedure: Left Heart Cath and Coronary Angiography;  Surgeon: Charolette Forward, MD;  Location: [place] [facility];  Service: Cardiovascular;  Laterality: N/A;  . CARDIAC CATHETERIZATION N/A 09/02/2015   Procedure: Coronary Stent Intervention;  Surgeon: Charolette Forward, MD;  Location: [place] [facility];  Service: Cardiovascular;  Laterality: N/A;  1.  mid RCA      (  3.0/28mm Xience) 2.  Mid LAD      (3.0/23mm Xience)  . CRANIOTOMY N/A 04/19/2019   Procedure: CRANIOTOMY HEMATOMA EVACUATION SUBDURAL;  Surgeon: Jovita Gamma, MD;  Location: [place];  Service: Neurosurgery;  Laterality: N/A;  CRANIOTOMY HEMATOMA EVACUATION SUBDURAL  . None      Family History:  Family History  Problem Relation Age of Onset  . Stroke Father        Died, 80s  . Stroke Sister        Living, 35  . Heart disease Mother        Died, 48  . Healthy Daughter   . Diabetes Maternal Grandmother   . Hypertension Neg Hx    Family Psychiatric  History:  Social History:  Social History   Substance and Sexual Activity  Alcohol Use Not Currently   Comment: socially     Social History   Substance and Sexual Activity  Drug Use Never    Social History   Socioeconomic History  . Marital status: Married    Spouse name: Not on file  . Number of children: Not on file  . Years of education: Not on file  . Highest education level: Not on file  Occupational History  . Not on file  Tobacco Use  . Smoking status: Former Smoker    Types: Cigarettes    Quit date: 08/24/1966    Years since quitting: 53.3  . Smokeless tobacco: Never Used  Substance and Sexual Activity  . Alcohol use: Not Currently    Comment: socially  . Drug use: Never  . Sexual activity: Not on file  Other Topics Concern  . Not on file  Social History Narrative   ** Merged History Encounter **       Currently works as a Midwife.  Lives with wife and they have two healthy daughters.   Social Determinants of Health   Financial Resource Strain:   . Difficulty of Paying Living Expenses:   Food Insecurity:   . Worried About Charity fundraiser in the Last Year:   . Arboriculturist in the Last Year:   Transportation Needs:   . Film/video editor (Medical):   [name] Lack of Transportation (Non-Medical):   Physical Activity:   . Days of Exercise per Week:   . Minutes of Exercise per Session:   Stress:   . Feeling of Stress :   Social Connections:   . Frequency of Communication with Friends and Family:   . Frequency of Social Gatherings with Friends and Family:   . Attends Religious Services:   . Active Member of Clubs or Organizations:   . Attends Theatre manager Meetings:   [name] Marital Status:    Additional Social History:                         Sleep: Good  Appetite:  Good  Current Medications: Current Outpatient Medications  Medication Sig Dispense Refill  . acetaminophen (TYLENOL) 325 MG tablet Take 1-2 tablets (325-650 mg total) by mouth every 4 (four) hours as needed for mild pain or headache.    [name] acetaminophen (TYLENOL) 325 MG tablet Take 325-650 mg by mouth every 6 (six) hours as needed for mild pain or headache.    . ALPRAZolam (XANAX) 0.5 MG tablet     . atorvastatin (LIPITOR) 40 MG tablet Take 1 tablet (40 mg total) by mouth daily at 6 PM. 30  tablet 3  . buPROPion (WELLBUTRIN XL) 300 MG 24 hr tablet Take 1 tablet (300 mg total) by mouth daily. 30 tablet 2  . HYDROcodone-acetaminophen (NORCO) 10-325 MG tablet     . hydrOXYzine (VISTARIL) 50 MG capsule Take 1 capsule (50 mg total) by mouth at bedtime. 30 capsule 1  . levothyroxine (SYNTHROID) 88 MCG tablet TAKE 1 TABLET BY MOUTH DAILY. 90 tablet 1  . lidocaine (LIDODERM) 5 % Place 1 patch onto the skin daily. Remove & Discard patch within 12 hours or as directed by MD 30 patch 0  . Plecanatide (TRULANCE) 3 MG TABS Take 1 tablet by mouth daily. 90 tablet 1  . ramipril (ALTACE) 1.25 MG capsule Take 1.25 mg by mouth daily after supper.    . venlafaxine XR (EFFEXOR-XR) 37.5 MG 24 hr capsule Take 1 capsule (37.5 mg total) by mouth daily. 30 capsule 6   No current facility-administered medications for this visit.    Lab Results:  No results found for this or any previous visit (from the past 48 hour(s)).  Physical Findings: AIMS:  , ,  ,  ,    CIWA:    COWS:     Musculoskeletal: Strength & Muscle Tone: within normal limits Gait & Station: normal Patient leans: Right  Psychiatric Specialty Exam: ROS  There were no vitals taken for this visit.There is no height or weight on file to calculate BMI.  General Appearance: Negative  Eye Contact::  Good  Speech:   Clear and Coherent  Volume:  Normal  Mood:  NA  Affect:  Appropriate  Thought Process:  Coherent  Orientation:  Full (Time, Place, and Person)  Thought Content:  WDL  Suicidal Thoughts:  No  Homicidal Thoughts:  No  Memory:  NA  Judgement:  Good  Insight:  Good  Psychomotor Activity:  Normal  Concentration:  Good  Recall:  Good  Fund of Knowledge:Good  Language: Good  Akathisia:  No  Handed:  Right  AIMS (if indicated):     Assets:  Desire for Improvement  ADL's:  Intact  Cognition: WNL  Sleep:      Treatment Plan Summary: This patient has 2 stable problems.  His first problem is that of clinical depression.  He takes Effexor and does very well.  He also takes a small dose of imipramine at night.  His second problem is that of insomnia.  He takes melatonin and at times he takes Restoril 15 mg.  Today he is assess to slightly increase his Xanax.  We will go ahead and increase it to a dose of 0.25 mg taking 2 at night.  Patient is no longer drinking.  He is functioning very well.  He will return to see me in 3 months.

## 2019-12-21 ENCOUNTER — Ambulatory Visit: Payer: Medicare Other | Admitting: Physical Therapy

## 2019-12-21 ENCOUNTER — Encounter: Payer: Self-pay | Admitting: Physical Therapy

## 2019-12-21 ENCOUNTER — Other Ambulatory Visit: Payer: Self-pay

## 2019-12-21 DIAGNOSIS — R2689 Other abnormalities of gait and mobility: Secondary | ICD-10-CM

## 2019-12-21 DIAGNOSIS — M6281 Muscle weakness (generalized): Secondary | ICD-10-CM | POA: Diagnosis not present

## 2019-12-21 DIAGNOSIS — R2681 Unsteadiness on feet: Secondary | ICD-10-CM | POA: Diagnosis not present

## 2019-12-21 DIAGNOSIS — R42 Dizziness and giddiness: Secondary | ICD-10-CM | POA: Diagnosis not present

## 2019-12-21 NOTE — Therapy (Signed)
[facility] [address] [place], [place], [address] Phone: [phone]   Fax:  [phone]  Physical Therapy Treatment  Patient Details  Name: Benjamin Hahn MRN: [id number] Date of Birth: 04-04-1933 Referring Provider (PT): Jovita Gamma   Encounter Date: 12/21/2019  PT End of Session - 12/21/19 0853    Visit Number  19    Number of Visits  29    Date for PT Re-Evaluation  50/03/70   per recert 11/29/8889-69 day recert for 8 wk POC recert   Authorization Type  Medicare/BCBS-Will need 10th visit progress note    PT Start Time  0850    PT Stop Time  0930    PT Time Calculation (min)  40 min    Activity Tolerance  Patient tolerated treatment well   LImited by dizziness, head fullness, progression to headache   Behavior During Therapy  [facility] for tasks assessed/performed       Past Medical History:  Diagnosis Date  . Anxiety   . Arthritis    might be in back, no problems  . BPH (benign prostatic hyperplasia)   . Depression 1990s  . GERD (gastroesophageal reflux disease)    occasional  . Hypercholesteremia    controled  . Hypothyroid   . MI (myocardial infarction) ([place])   . Shingles May 2014   "Right face, still has some"  . Temporal arteritis ([place])   . Transient blindness of both eyes     Past Surgical History:  Procedure Laterality Date  . ARTERY BIOPSY Right 06/20/2013   Procedure: BIOPSY TEMPORAL ARTERY RIGHT;  Surgeon: Earnstine Regal, MD;  [facility]: [facility];  Service: General;  Laterality: Right;  . CARDIAC CATHETERIZATION N/A 09/02/2015   Procedure: Left Heart Cath and Coronary Angiography;  Surgeon: Charolette Forward, MD;  Location: [place] [facility];  Service: Cardiovascular;  Laterality: N/A;  . CARDIAC CATHETERIZATION N/A 09/02/2015   Procedure: Coronary Stent Intervention;  Surgeon: Charolette Forward, MD;  Location: [place] [facility];  Service: Cardiovascular;  Laterality: N/A;  1.  mid RCA       (3.0/28mm Xience) 2.  Mid LAD      (3.0/23mm Xience)  . CRANIOTOMY N/A 04/19/2019   Procedure: CRANIOTOMY HEMATOMA EVACUATION SUBDURAL;  Surgeon: Jovita Gamma, MD;  Location: [place];  Service: Neurosurgery;  Laterality: N/A;  CRANIOTOMY HEMATOMA EVACUATION SUBDURAL  . None      There were no vitals filed for this visit.  Subjective Assessment - 12/21/19 0852    Subjective  No changes.  Didn't have a chance to do my exercises.    Patient Stated Goals  Pt's goals are to improve walking, balance, strength.  Would like to get back to driving.    Currently in Pain?  No/denies         Therapeutic Exercise:   Reviewed HEP from last visit, pt return demo understanding with minimal cues Seated Hamstring Stretch with Strap - 3 x daily - 7 x weekly - 3 sets - 60 seconds hold (verbal review) Seated Hamstring Stretch - 2-3 x daily - 7 x weekly - 3 reps - 1 sets - 30 sec hold Seated Toe Raise - 1-2 x daily - 7 x weekly - 2 sets - 10 reps - 3 second hold Proper Sit to Stand Technique - 1 x daily - 5 x weekly - 2 sets - 5-10 reps  Neuro Re-education:  Staggered Stance Forward Backward Weight Shift with Counter Support - 1  x daily - 5 x weekly - 10 reps - 1-2 sets; manual cues through hips for "hinge" at hips in posterior direction Alternating Heel Raises - 1 x daily - 5 x weekly - 10 reps - 1-2 sets (cues for slowed pace, to allow for weightshift for improved motion of up on toes) Correct Standing Posture - 2 x daily - 7 x weekly - 3 sets - 60 seconds hold (verbal review)  At counter:   Step and weight shift forward x 10 using 2" obstacle and tactile cues through hips for improved step length/height and improved weightshift through hips. Step and weigthshift side x 10, using 2" obstacle and tactile cues through hips for improved step length/height and improved weightshift through hips.  Forward/back step and weightshift x 10 reps each leg, facilitation at hips for improved step length and  weigthshift             [facility] Adult PT Treatment/Exercise - 12/21/19 0956      Transfers   Transfers  Sit to Stand;Stand to Sit    Sit to Stand  5: Supervision;4: Min guard;With upper extremity assist;Without upper extremity assist;From bed;From chair/3-in-1    Sit to Stand Details  Verbal cues for technique    Stand to Sit  5: Supervision;4: Min guard;With upper extremity assist;Without upper extremity assist;To bed;To chair/3-in-1    Stand to Sit Details (indicate cue type and reason)  Verbal cues for precautions/safety      Ambulation/Gait   Ambulation/Gait  Yes    Ambulation/Gait Assistance  4: Min guard;5: Supervision    Ambulation/Gait Assistance Details  Occasional Cues for increased step length, slowed pace with gait    Ambulation Distance (Feet)  360 Feet   280   Assistive device  Rollator    Gait Pattern  Step-through pattern;Decreased step length - right;Decreased step length - left;Narrow base of support;Trunk flexed;Shuffle;Step-to pattern;Poor foot clearance - left;Poor foot clearance - right    Ambulation Surface  Level;Indoor    Gait Comments  Pt walks in without rollator (today and previous session).  PT discussed recommendation to use rollator at all times.             PT Education - 12/21/19 0959    Education Details  Reviewed HEP reprinted and given last visit; reiterated importance of consistent daily performance of HEP; requested pt perform HEP DAILY between now and next visit    Person(s) Educated  Patient    Methods  Explanation;Demonstration;Verbal cues    Comprehension  Verbalized understanding;Returned demonstration       PT Short Term Goals - 12/07/19 0859      PT SHORT TERM GOAL #1   Title  Pt will be independent with HEP for improved strength, balance, and gait.  TARGET 11/24/2019    Time  4    Period  Weeks    Status  Revised      PT SHORT TERM GOAL #2   Title  Pt will improve TUG score to less than or equal to 13.5 seconds for  decreased fall risk.    Baseline  10/26/2019:  15.13 sec    Time  4    Period  Weeks    Status  Not Met      PT SHORT TERM GOAL #3   Title  Pt will improve Berg Balance score to at least 43/56 for decreased fall risk.    Baseline  10/26/2019:  38/56; 12/07/19 43/56    Time  4  Period  Weeks    Status  Achieved      PT SHORT TERM GOAL #4   Title  Pt will improve 5x sit<>stand to less than or equal to 18 seconds for decreased fall risk.    Baseline  21.44 sec with posterior lean 10/26/2019; 13.86 sec 12/07/2019    Time  4    Period  Weeks    Status  Achieved      PT SHORT TERM GOAL #5   Title  Pt will verbalize understanding of fall prevention in home envrionment.    Time  4    Period  Weeks    Status  New         PT Long Term Goals - 12/19/19 1439      PT LONG TERM GOAL #1   Title  Pt will be independent with progression of HEP for improved balance, strength, and gait.  For all LTGs:  UPDATED TARGET5/21/2021 (extended due to weeks remain in POC)    Time  8    Period  Weeks    Status  On-going      PT LONG TERM GOAL #2   Title  Pt will improve TUG cognitive score to less than or equal to 15 seconds for decreased fall risk.    Baseline  19.12 sec 10/26/2019    Time  8    Period  Weeks    Status  On-going      PT LONG TERM GOAL #3   Title  Pt will improve Berg score to at least 48/56 for decreased fall risk.    Baseline  38/56 10/26/2019; 43/56 12/07/2019    Time  8    Period  Weeks    Status  On-going      PT LONG TERM GOAL #4   Title  Pt will improve gait velocity to at least 2.62 ft/sec for improved gait efficiency and safety.    Time  8    Period  Weeks    Status  On-going      PT LONG TERM GOAL #5   Title  Pt will perform at least 8 of 10 reps of sit<>stand from 18" surfaces with no posterior lean, for improved transfers.    Time  8    Period  Weeks    Status  On-going      PT LONG TERM GOAL #6   Title  Pt will ambulate at least 500 ft, indoor and outdoor  surfaces, for community ambulation, with appropriate assistive device and supervision.    Time  8    Period  Weeks    Status  On-going      PT LONG TERM GOAL #7   Title  Pt will report 50% less "fullness in head/dizziness" with turns and head turns, for improved balance.    Baseline  Reports symptoms as 6/10, progressing to headache with 360 turn and step taps 12/07/2019    Time  4    Period  Weeks    Status  New            Plan - 12/21/19 1000    Clinical Impression Statement  Skilled PT session worked on reviewing HEP (with education on importance of consistency of exercises), gait training with rollator, and standing exercises for improved weightshfiting and balance.  With slowed pace and manual cues given at hips, pt demo improved weightshifting through hips with standing exercises; he does demonstrate continued hesitancy in posterior direction.  He  will continue to benefit from further skilled PT to further address balance, funcitonal strength and gait.    Personal Factors and Comorbidities  Comorbidity 2;Behavior Pattern   hx of falls   Comorbidities  HTN, hyperlipidemia    Examination-Activity Limitations  Locomotion Level;Transfers;Squat;Stairs;Stand;Other    Examination-Participation Restrictions  Community Activity;Driving;Other   walking in neighborhood, exercise at fitness center   Stability/Clinical Decision Making  Evolving/Moderate complexity    PT Frequency  2x / week    PT Duration  8 weeks   per recert 11/25/5144   PT Treatment/Interventions  ADLs/Self Care Home Management;Gait training;Stair training;Functional mobility training;Therapeutic activities;Therapeutic exercise;Balance training;Neuromuscular re-education;Patient/family education;Canalith Repostioning;Vestibular    PT Next Visit Plan  Ask about HEP performance;  WORK ON BALANCE-stepping, weightshifting, compliant surfaces; Further assessment of visual/vestibular systems (due to c/o head fullness/dizziness  4/15 visit) as needed; unsure that this is BPPV, but may need further assessment; focus on balance and lower extremity strengthening (this is what is concerning patient today). Functional strengthening gait in addition to balance.    PT Home Exercise Plan  Access Code: [email or web address] (condensed to this access code on 11/08/19.  This code should contain all current and appropriate exercises    Consulted and Agree with Plan of Care  Patient       Patient will benefit from skilled therapeutic intervention in order to improve the following deficits and impairments:  Abnormal gait, Difficulty walking, Decreased activity tolerance, Decreased balance, Decreased mobility, Decreased strength, Postural dysfunction, Decreased safety awareness, Dizziness  Visit Diagnosis: Unsteadiness on feet  Other abnormalities of gait and mobility  Muscle weakness (generalized)     Problem List Patient Active Problem List   Diagnosis Date Noted  . Routine general medical examination at a health care facility 12/09/2019  . Hypotension 12/07/2019  . Psychophysiological insomnia 12/07/2019  . Deficiency anemia 06/01/2019  . Vitamin D deficiency 06/01/2019  . Chronic idiopathic constipation 06/01/2019  . Hyperglycemia 04/15/2019  . Mild intermittent asthma with acute exacerbation 02/08/2018  . Hypothyroidism 03/14/2013  . Hyperlipidemia LDL goal <70 03/14/2013  . BPH (benign prostatic hypertrophy) 03/14/2013  . Hypogonadism, male 03/14/2013  . Erectile dysfunction 03/14/2013    Frazier Butt. 12/21/2019, 10:04 AM  Frazier Butt., PT   [facility] [address] [place], [place], [address] Phone: [phone]   Fax:  [phone]  Name: Benjamin Hahn MRN: [id number] Date of Birth: 04-Mar-1933

## 2020-01-01 ENCOUNTER — Ambulatory Visit: Payer: Medicare Other | Attending: Physician Assistant | Admitting: Physical Therapy

## 2020-01-01 ENCOUNTER — Other Ambulatory Visit: Payer: Self-pay

## 2020-01-01 ENCOUNTER — Other Ambulatory Visit: Payer: Self-pay | Admitting: Internal Medicine

## 2020-01-01 DIAGNOSIS — R2681 Unsteadiness on feet: Secondary | ICD-10-CM | POA: Insufficient documentation

## 2020-01-01 DIAGNOSIS — R2689 Other abnormalities of gait and mobility: Secondary | ICD-10-CM | POA: Diagnosis not present

## 2020-01-01 NOTE — Therapy (Addendum)
[facility] [address] [place], [place], [address] Phone: [phone]   Fax:  [phone]  Physical Therapy Treatment/10th Visit Progress Note  Patient Details  Name: Benjamin Hahn MRN: [id number] Date of Birth: [birth year] Referring Provider (PT): Jovita Gamma   Encounter Date: 01/01/2020  PT End of Session - 01/01/20 0853    Visit Number  20    Number of Visits  29    Date for PT Re-Evaluation  69/62/95   per recert 10/01/4130-44 day recert for 8 wk POC recert   Authorization Type  Medicare/BCBS-Will need 10th visit progress note    PT Start Time  0850    PT Stop Time  0930    PT Time Calculation (min)  40 min    Activity Tolerance  Patient tolerated treatment well    Behavior During Therapy  [facility] for tasks assessed/performed;Impulsive   impulsive with transfers      Past Medical History:  Diagnosis Date  . Anxiety   . Arthritis    might be in back, no problems  . BPH (benign prostatic hyperplasia)   . Depression 1990s  . GERD (gastroesophageal reflux disease)    occasional  . Hypercholesteremia    controled  . Hypothyroid   . MI (myocardial infarction) ([place])   . Shingles May 2014   "Right face, still has some"  . Temporal arteritis ([place])   . Transient blindness of both eyes     Past Surgical History:  Procedure Laterality Date  . ARTERY BIOPSY Right 06/20/2013   Procedure: BIOPSY TEMPORAL ARTERY RIGHT;  Surgeon: Earnstine Regal, MD;  Location: [facility];  Service: General;  Laterality: Right;  . CARDIAC CATHETERIZATION N/A 09/02/2015   Procedure: Left Heart Cath and Coronary Angiography;  Surgeon: Charolette Forward, MD;  Location: [place] [facility];  Service: Cardiovascular;  Laterality: N/A;  . CARDIAC CATHETERIZATION N/A 09/02/2015   Procedure: Coronary Stent Intervention;  Surgeon: Charolette Forward, MD;  Location: [place] [facility];  Service: Cardiovascular;  Laterality: N/A;  1.  mid RCA       (3.0/28mm Xience) 2.  Mid LAD      (3.0/23mm Xience)  . CRANIOTOMY N/A 04/19/2019   Procedure: CRANIOTOMY HEMATOMA EVACUATION SUBDURAL;  Surgeon: Jovita Gamma, MD;  Location: [place];  Service: Neurosurgery;  Laterality: N/A;  CRANIOTOMY HEMATOMA EVACUATION SUBDURAL  . None      There were no vitals filed for this visit.  Subjective Assessment - 01/01/20 0853    Subjective  Had a busy time with daughter being in town.  Did not do my exercises since I saw you last.    Patient Stated Goals  Pt's goals are to improve walking, balance, strength.  Would like to get back to driving.    Currently in Pain?  No/denies                       [facility] Adult PT Treatment/Exercise - 01/01/20 0856      Transfers   Transfers  Sit to Stand;Stand to Sit    Sit to Stand  5: Supervision;4: Min guard;With upper extremity assist;Without upper extremity assist;From bed;From chair/3-in-1    Sit to Stand Details  Verbal cues for technique    Sit to Stand Details (indicate cue type and reason)  Cues to slow pace and increase forward lean to initiate safe transfer.  He is able to do this for about 3 repetitions (from mat and from  chair) prior to speeding up and decreasing forward lean to having retropulsion up to standing.    Stand to Sit  5: Supervision;4: Min guard;With upper extremity assist;Without upper extremity assist;To bed;To chair/3-in-1    Stand to Sit Details (indicate cue type and reason)  Verbal cues for precautions/safety    Stand to Sit Details  Cues to fully turn and reach for chair to sit    Number of Reps  10 reps;2 sets   from mat, from chair   Transfer Cueing  cues for nose over toes for increased forward weightshift; 2 episodes from mat with strong posterior lean.  At least 4 reps from chair with posterior lean.      Ambulation/Gait   Ambulation/Gait  Yes    Ambulation/Gait Assistance  4: Min guard;5: Supervision    Ambulation/Gait Assistance Details  Cues to increase step  length, improve foot clearance with gait.    Ambulation Distance (Feet)  400 Feet   200   Assistive device  Rollator    Gait Pattern  Step-through pattern;Decreased step length - right;Decreased step length - left;Narrow base of support;Trunk flexed;Shuffle;Step-to pattern;Poor foot clearance - left;Poor foot clearance - right    Ambulation Surface  Level;Indoor    Gait velocity  17.6 sec = 1.86 ft/sec      Timed Up and Go Test   TUG  Normal TUG    Normal TUG (seconds)  16.31      Self-Care   Self-Care  Other Self-Care Comments    Other Self-Care Comments   Discussed progress towards goals (this is a 10th visit progress note); discussed that gait velocity fluctuates and that his not performing HEP consistently is affecting progress with goals.  Discussed decreased safety awareness with transfers and gait and needing cueing from PT (and likely from family members) consistently to improve safety with transfers and gait.  Discussed likely planned d/c after next week.         Neuro Re-education:   Stanidng at counter:   -Lateral weightshifting x 10 reps with wide BOS -Side step and weigthshift, progressing to 90 degree/quarter turns, 5 reps each side to work on initiating turns -Side step and weigthshift, x 10 reps each side, cues for stepping high and wide and facilitation cues for weigthshift through hips. -Side step together, R and L, x 10 reps, cues for increased step height/foot clearance. -Back step together, R and L legs leading, x 5 reps each with UE support at counter.  Standing at locked rollator: -Forward/back kicks, x 10 reps each leg -Marching in place x 10 reps with cues for increased step height     PT Education - 01/01/20 1138    Education Details  See self-care; discussed limited progress towards goals (pt not doing HEP, continued decreased safety awareness) and likely d/c next week    Person(s) Educated  Patient    Methods  Explanation    Comprehension  Verbalized  understanding       PT Short Term Goals - 12/07/19 0859      PT SHORT TERM GOAL #1   Title  Pt will be independent with HEP for improved strength, balance, and gait.  TARGET 11/24/2019    Time  4    Period  Weeks    Status  Revised      PT SHORT TERM GOAL #2   Title  Pt will improve TUG score to less than or equal to 13.5 seconds for decreased fall risk.  Baseline  10/26/2019:  15.13 sec    Time  4    Period  Weeks    Status  Not Met      PT SHORT TERM GOAL #3   Title  Pt will improve Berg Balance score to at least 43/56 for decreased fall risk.    Baseline  10/26/2019:  38/56; 12/07/19 43/56    Time  4    Period  Weeks    Status  Achieved      PT SHORT TERM GOAL #4   Title  Pt will improve 5x sit<>stand to less than or equal to 18 seconds for decreased fall risk.    Baseline  21.44 sec with posterior lean 10/26/2019; 13.86 sec 12/07/2019    Time  4    Period  Weeks    Status  Achieved      PT SHORT TERM GOAL #5   Title  Pt will verbalize understanding of fall prevention in home envrionment.    Time  4    Period  Weeks    Status  New        PT Long Term Goals - 12/19/19 1439      PT LONG TERM GOAL #1   Title  Pt will be independent with progression of HEP for improved balance, strength, and gait.  For all LTGs:  UPDATED TARGET5/21/2021 (extended due to weeks remain in POC)    Time  8    Period  Weeks    Status  On-going      PT LONG TERM GOAL #2   Title  Pt will improve TUG cognitive score to less than or equal to 15 seconds for decreased fall risk.    Baseline  19.12 sec 10/26/2019    Time  8    Period  Weeks    Status  On-going      PT LONG TERM GOAL #3   Title  Pt will improve Berg score to at least 48/56 for decreased fall risk.    Baseline  38/56 10/26/2019; 43/56 12/07/2019    Time  8    Period  Weeks    Status  On-going      PT LONG TERM GOAL #4   Title  Pt will improve gait velocity to at least 2.62 ft/sec for improved gait efficiency and safety.    Time   8    Period  Weeks    Status  On-going      PT LONG TERM GOAL #5   Title  Pt will perform at least 8 of 10 reps of sit<>stand from 18" surfaces with no posterior lean, for improved transfers.    Time  8    Period  Weeks    Status  On-going      PT LONG TERM GOAL #6   Title  Pt will ambulate at least 500 ft, indoor and outdoor surfaces, for community ambulation, with appropriate assistive device and supervision.    Time  8    Period  Weeks    Status  On-going      PT LONG TERM GOAL #7   Title  Pt will report 50% less "fullness in head/dizziness" with turns and head turns, for improved balance.    Baseline  Reports symptoms as 6/10, progressing to headache with 360 turn and step taps 12/07/2019    Time  4    Period  Weeks    Status  New  Plan - 01/01/20 1140    Clinical Impression Statement  10th Visit PROGRESS NOTE for 20th visit; spanning dates 09/28/2019-01/01/2020 (pt had mised several weeks in February and March and then returned to therapy):  Objective measures:  gait velocity 1.86 ft/sec; TUG 16.31 seconds (gait velocity is slower than 3/4/measures of 2.34 ft/sec); TUG is slightly slower than 3/4 measure of 15.13, but improved from 12/07/2019 measure.  Pt's functional mobility has fluctuated, and pt consistently needs verbal cues for safety and technique with transfers and gait. At last [facility] Check, he had improved score to 43/56.  Pt remains at fall risk and PT has recommended use of rollator (pt reports using in home and in office, but does not bring into therapy sessions).  Pt's wife has had recent surgery and family has not been present for PT sessions; will recommend pt have family available for next several sessions for family education on transfer and gait safety, fall prevention, and HEP, with plans for discharge in next 2-3 visits.    Personal Factors and Comorbidities  Comorbidity 2;Behavior Pattern   hx of falls   Comorbidities  HTN, hyperlipidemia     Examination-Activity Limitations  Locomotion Level;Transfers;Squat;Stairs;Stand;Other    Examination-Participation Restrictions  Community Activity;Driving;Other   walking in neighborhood, exercise at fitness center   Stability/Clinical Decision Making  Evolving/Moderate complexity    PT Frequency  2x / week    PT Duration  8 weeks   per recert 04/29/7590   PT Treatment/Interventions  ADLs/Self Care Home Management;Gait training;Stair training;Functional mobility training;Therapeutic activities;Therapeutic exercise;Balance training;Neuromuscular re-education;Patient/family education;Canalith Repostioning;Vestibular    PT Next Visit Plan  Family education:  fall prevention, HEP, transfer safety, gait safety; plan for d/c by end of scheduled visits (week of 01/08/2020)    PT Home Exercise Plan  Access Code: [email or web address] (condensed to this access code on 11/08/19.  This code should contain all current and appropriate exercises    Consulted and Agree with Plan of Care  Patient       Patient will benefit from skilled therapeutic intervention in order to improve the following deficits and impairments:  Abnormal gait, Difficulty walking, Decreased activity tolerance, Decreased balance, Decreased mobility, Decreased strength, Postural dysfunction, Decreased safety awareness, Dizziness  Visit Diagnosis: Unsteadiness on feet  Other abnormalities of gait and mobility     Problem List Patient Active Problem List   Diagnosis Date Noted  . Routine general medical examination at a health care facility 12/09/2019  . Hypotension 12/07/2019  . Psychophysiological insomnia 12/07/2019  . Deficiency anemia 06/01/2019  . Vitamin D deficiency 06/01/2019  . Chronic idiopathic constipation 06/01/2019  . Hyperglycemia 04/15/2019  . Mild intermittent asthma with acute exacerbation 02/08/2018  . Hypothyroidism 03/14/2013  . Hyperlipidemia LDL goal <70 03/14/2013  . BPH (benign prostatic hypertrophy) 03/14/2013  .  Hypogonadism, male 03/14/2013  . Erectile dysfunction 03/14/2013    Frazier Butt. 01/01/2020, 11:47 AM  Frazier Butt., PT  [facility] [address] [place], [place], [address] Phone: [phone]   Fax:  [phone]  Name: Benjamin Hahn MRN: [id number] Date of Birth: [birth year]

## 2020-01-05 ENCOUNTER — Ambulatory Visit: Payer: Medicare Other | Admitting: Physical Therapy

## 2020-01-08 ENCOUNTER — Ambulatory Visit: Payer: Medicare Other | Admitting: Physical Therapy

## 2020-01-08 ENCOUNTER — Encounter: Payer: Self-pay | Admitting: Physical Therapy

## 2020-01-08 ENCOUNTER — Other Ambulatory Visit: Payer: Self-pay

## 2020-01-08 DIAGNOSIS — R2689 Other abnormalities of gait and mobility: Secondary | ICD-10-CM | POA: Diagnosis not present

## 2020-01-08 DIAGNOSIS — R2681 Unsteadiness on feet: Secondary | ICD-10-CM

## 2020-01-08 NOTE — Therapy (Signed)
[name] [place], [place], [address] Phone: [phone]   Fax:  [phone]  Physical Therapy Treatment  Patient Details  Name: Benjamin Hahn MRN: [id number] Date of Birth: 1932/10/05 Referring Provider (PT): Jovita Gamma   Encounter Date: 01/08/2020  PT End of Session - 01/08/20 0933    Visit Number  21    Number of Visits  29    Date for PT Re-Evaluation  45/80/99   per recert 03/26/3824-05 day recert for 8 wk POC recert   Authorization Type  Medicare/BCBS-Will need 10th visit progress note    PT Start Time  0848    PT Stop Time  0931    PT Time Calculation (min)  43 min    Activity Tolerance  Patient tolerated treatment well    Behavior During Therapy  [facility] for tasks assessed/performed;Impulsive   impulsive with transfers      Past Medical History:  Diagnosis Date  . Anxiety   . Arthritis    might be in back, no problems  . BPH (benign prostatic hyperplasia)   . Depression 1990s  . GERD (gastroesophageal reflux disease)    occasional  . Hypercholesteremia    controled  . Hypothyroid   . MI (myocardial infarction) ([place])   . Shingles May 2014   "Right face, still has some"  . Temporal arteritis ([place])   . Transient blindness of both eyes     Past Surgical History:  Procedure Laterality Date  . ARTERY BIOPSY Right 06/20/2013   Procedure: BIOPSY TEMPORAL ARTERY RIGHT;  Surgeon: Earnstine Regal, MD;  Location: [facility];  Service: General;  Laterality: Right;  . CARDIAC CATHETERIZATION N/A 09/02/2015   Procedure: Left Heart Cath and Coronary Angiography;  Surgeon: Charolette Forward, MD;  Location: [facility];  Service: Cardiovascular;  Laterality: N/A;  . CARDIAC CATHETERIZATION N/A 09/02/2015   Procedure: Coronary Stent Intervention;  Surgeon: Charolette Forward, MD;  Location: [facility];  Service: Cardiovascular;  Laterality: N/A;  1.  mid RCA      (3.0/28mm Xience) 2.  Mid LAD     (3.0/23mm Xience)  . CRANIOTOMY N/A 04/19/2019   Procedure: CRANIOTOMY HEMATOMA EVACUATION SUBDURAL;  Surgeon: Jovita Gamma, MD;  Location: [facility];  Service: Neurosurgery;  Laterality: N/A;  CRANIOTOMY HEMATOMA EVACUATION SUBDURAL  . None      There were no vitals filed for this visit.  Subjective Assessment - 01/08/20 0854    Subjective  Had a hard time with getting up from chairs in the bank the other day.    Patient is accompained by:  Family member   wife   Patient Stated Goals  Pt's goals are to improve walking, balance, strength.  Would like to get back to driving.    Currently in Pain?  No/denies                        [facility] Adult PT Treatment/Exercise - 01/08/20 0900      Transfers   Transfers  Sit to Stand;Stand to Sit    Sit to Stand  5: Supervision;4: Min guard;With upper extremity assist;Without upper extremity assist;From bed;From chair/3-in-1    Sit to Stand Details  Verbal cues for technique    Sit to Stand Details (indicate cue type and reason)  Cues for SLOW, controlled motion (wife present initially for instruction); cues for scooting forward, foot placement, increased forward lean to initiate standing.  Stand to Sit  5: Supervision;4: Min guard;With upper extremity assist;Without upper extremity assist;To bed;To chair/3-in-1    Stand to Sit Details (indicate cue type and reason)  Verbal cues for precautions/safety    Stand to Sit Details  Cues to fully turn to squat and slowly sit    Number of Reps  10 reps;2 sets    Comments  Pt performs 8 of 10 reps without posterior lean.  When cues removed, he has progressively more posteriorly lean and retropulsion.      Ambulation/Gait   Ambulation/Gait  Yes    Ambulation/Gait Assistance  4: Min guard    Ambulation/Gait Assistance Details  Pt does not bring in rollator; gait trials without device, and with rollator at end of session.    Ambulation Distance (Feet)  100 Feet   x 2; 50 ft x 3 reps    Assistive device  None;Rollator    Gait Pattern  Step-through pattern;Decreased step length - right;Decreased step length - left;Narrow base of support;Trunk flexed;Shuffle;Step-to pattern;Poor foot clearance - left;Poor foot clearance - right    Ambulation Surface  Level;Indoor    Gait velocity  23.91 sec = 1.37 ft/sec    20.62 (no device); 17.85 sec rollator:  1.83 ft/sec     Standardized Balance Assessment   Standardized Balance Assessment  Berg Balance Test      Berg Balance Test   Sit to Stand  Able to stand  independently using hands    Standing Unsupported  Able to stand safely 2 minutes    Sitting with Back Unsupported but Feet Supported on Floor or Stool  Able to sit safely and securely 2 minutes    Stand to Sit  Controls descent by using hands    Transfers  Able to transfer safely, definite need of hands    Standing Unsupported with Eyes Closed  Able to stand 10 seconds safely    Standing Ubsupported with Feet Together  Able to place feet together independently and stand 1 minute safely    From Standing, Reach Forward with Outstretched Arm  Can reach forward >12 cm safely (5")    From Standing Position, Pick up Object from Floor  Able to pick up shoe, needs supervision    From Standing Position, Turn to Look Behind Over each Shoulder  Looks behind from both sides and weight shifts well    Turn 360 Degrees  Able to turn 360 degrees safely but slowly    Standing Unsupported, Alternately Place Feet on Step/Stool  Needs assistance to keep from falling or unable to try   Pt would fall if unaided after 2nd step   Standing Unsupported, One Foot in Front  Able to take small step independently and hold 30 seconds    Standing on One Leg  Unable to try or needs assist to prevent fall    Total Score  39      Timed Up and Go Test   TUG  Normal TUG      Self-Care   Self-Care  Other Self-Care Comments    Other Self-Care Comments   Wife present at beginning of session and end of session,  with PT having frank conversation about pt's (lack of) progress and fluctuating mobility measures.  PT discussed that pt continues to need cues for technique for sit<>stand transfers (despite multi-modal cues and practice in various PT sessions); disucssed pt's distractability with gait and transfers, his speed of motions (speeding up tends to decrease safety), his conitnued shuffling  with gait despite cues, repeated practice.  Discussed optimal safety is for pt to have supervision and cues for transfers and gait and to use gait with rollator at all times.  Discussed return visit to MD, with possible request to consult neurologist, as pt's mobility measures are generally not improving despite therapy and he remains at significant risk of falls.               PT Education - 01/08/20 1136    Education Details  See self-care-plans for d/c next viist due to decreased progres and continued fall risk; recommend pt follow up with MD    Person(s) Educated  Patient;Spouse    Methods  Explanation    Comprehension  Verbalized understanding       PT Short Term Goals - 12/07/19 0859      PT SHORT TERM GOAL #1   Title  Pt will be independent with HEP for improved strength, balance, and gait.  TARGET 11/24/2019    Time  4    Period  Weeks    Status  Revised      PT SHORT TERM GOAL #2   Title  Pt will improve TUG score to less than or equal to 13.5 seconds for decreased fall risk.    Baseline  10/26/2019:  15.13 sec    Time  4    Period  Weeks    Status  Not Met      PT SHORT TERM GOAL #3   Title  Pt will improve Berg Balance score to at least 43/56 for decreased fall risk.    Baseline  10/26/2019:  38/56; 12/07/19 43/56    Time  4    Period  Weeks    Status  Achieved      PT SHORT TERM GOAL #4   Title  Pt will improve 5x sit<>stand to less than or equal to 18 seconds for decreased fall risk.    Baseline  21.44 sec with posterior lean 10/26/2019; 13.86 sec 12/07/2019    Time  4    Period  Weeks     Status  Achieved      PT SHORT TERM GOAL #5   Title  Pt will verbalize understanding of fall prevention in home envrionment.    Time  4    Period  Weeks    Status  New        PT Long Term Goals - 01/08/20 0917      PT LONG TERM GOAL #1   Title  Pt will be independent with progression of HEP for improved balance, strength, and gait.  For all LTGs:  UPDATED TARGET5/21/2021 (extended due to weeks remain in POC)    Time  8    Period  Weeks    Status  On-going      PT LONG TERM GOAL #2   Title  Pt will improve TUG cognitive score to less than or equal to 15 seconds for decreased fall risk.    Baseline  19.12 sec 10/26/2019    Time  8    Period  Weeks    Status  On-going      PT LONG TERM GOAL #3   Title  Pt will improve Berg score to at least 48/56 for decreased fall risk.    Baseline  38/56 10/26/2019; 43/56 12/07/2019; 39/56 01/08/2020    Time  8    Period  Weeks    Status  Not Met  PT LONG TERM GOAL #4   Title  Pt will improve gait velocity to at least 2.62 ft/sec for improved gait efficiency and safety.    Baseline  1.3 ft/sec no device; 1.8 ft/sec rollator 01/08/2020    Time  8    Period  Weeks    Status  Not Met      PT LONG TERM GOAL #5   Title  Pt will perform at least 8 of 10 reps of sit<>stand from 18" surfaces with no posterior lean, for improved transfers.    Time  8    Period  Weeks    Status  Not Met      PT LONG TERM GOAL #6   Title  Pt will ambulate at least 500 ft, indoor and outdoor surfaces, for community ambulation, with appropriate assistive device and supervision.    Time  8    Period  Weeks    Status  On-going      PT LONG TERM GOAL #7   Title  Pt will report 50% less "fullness in head/dizziness" with turns and head turns, for improved balance.    Baseline  Reports symptoms as 6/10, progressing to headache with 360 turn and step taps 12/07/2019; occasional reports of fullness in head with certain activities    Time  4    Period  Weeks    Status   Partially Met            Plan - 01/08/20 1137    Clinical Impression Statement  Began formally assessing LTGs this visit, with pt not meeting LTGs 3, 4, 5.  Berg Balance test has decreased from 43/56 to 39/56 today.  Gait velocity has continued to fluctuate, with 1.3 ft/sec with no device and 1.8 ft/sec with rollator today.  He continues to demonstrate retropulsion with attempts at sit<>stand, particularly near end of 10 rep sets, as he will speed up motion, despite cues.  Had frank discussion with pt and wife that despite therapy's efforts, he remains at significant fall risk and needs supervision and cues for optimal safety.  He is continuing to display hastening of gait and mobility, shuffling steps, especially when not using assistive device; retropulsion with trasnfers, overall decreased safety awareness with mobility. Discussed plans for d/c this week due to decreased progress with therapy, with recommendation for pt to follow up with MD due to above stated mobility concerns.    Personal Factors and Comorbidities  Comorbidity 2;Behavior Pattern   hx of falls   Comorbidities  HTN, hyperlipidemia    Examination-Activity Limitations  Locomotion Level;Transfers;Squat;Stairs;Stand;Other    Examination-Participation Restrictions  Community Activity;Driving;Other   walking in neighborhood, exercise at fitness center   Stability/Clinical Decision Making  Evolving/Moderate complexity    PT Frequency  2x / week    PT Duration  8 weeks   per recert 04/26/2670   PT Treatment/Interventions  ADLs/Self Care Home Management;Gait training;Stair training;Functional mobility training;Therapeutic activities;Therapeutic exercise;Balance training;Neuromuscular re-education;Patient/family education;Canalith Repostioning;Vestibular    PT Next Visit Plan  Check remaining goals and plan for d/c    PT Home Exercise Plan  Access Code: [email or web address] (condensed to this access code on 11/08/19.  This code should contain all  current and appropriate exercises    Consulted and Agree with Plan of Care  Patient;Family member/caregiver    Family Member Consulted  wife-at beginning and end of session       Patient will benefit from skilled therapeutic intervention in order to improve the following deficits and  impairments:  Abnormal gait, Difficulty walking, Decreased activity tolerance, Decreased balance, Decreased mobility, Decreased strength, Postural dysfunction, Decreased safety awareness, Dizziness  Visit Diagnosis: Unsteadiness on feet  Other abnormalities of gait and mobility     Problem List Patient Active Problem List   Diagnosis Date Noted  . Routine general medical examination at a health care facility 12/09/2019  . Hypotension 12/07/2019  . Psychophysiological insomnia 12/07/2019  . Deficiency anemia 06/01/2019  . Vitamin D deficiency 06/01/2019  . Chronic idiopathic constipation 06/01/2019  . Hyperglycemia 04/15/2019  . Mild intermittent asthma with acute exacerbation 02/08/2018  . Hypothyroidism 03/14/2013  . Hyperlipidemia LDL goal <70 03/14/2013  . BPH (benign prostatic hypertrophy) 03/14/2013  . Hypogonadism, male 03/14/2013  . Erectile dysfunction 03/14/2013    Lisanne Ponce W. 01/08/2020, 11:50 AM Frazier Butt., PT  [facility] [address] [place], [place], [address] Phone: [phone]   Fax:  [phone]  Name: Benjamin Hahn MRN: [id number] Date of Birth: [birth year]

## 2020-01-12 ENCOUNTER — Ambulatory Visit: Payer: Medicare Other | Admitting: Physical Therapy

## 2020-01-26 DIAGNOSIS — S2231XD Fracture of one rib, right side, subsequent encounter for fracture with routine healing: Secondary | ICD-10-CM | POA: Diagnosis not present

## 2020-01-30 ENCOUNTER — Telehealth: Payer: Self-pay | Admitting: Internal Medicine

## 2020-01-30 NOTE — Telephone Encounter (Signed)
Recv'd records from [facility] forwarded to Dr. Scarlette Calico 6/8/21fbg

## 2020-02-05 ENCOUNTER — Encounter: Payer: Self-pay | Admitting: Physical Therapy

## 2020-02-05 NOTE — Therapy (Signed)
[facility] [address] [place], [place], [address] Phone: [phone]   Fax:  [phone]  Patient Details  Name: Benjamin Hahn MRN: [id number] Date of Birth: [birth year] Referring Provider:  No ref. provider found  Encounter Date: 02/05/2020  PHYSICAL THERAPY DISCHARGE SUMMARY  Visits from Start of Care: 21  Current functional level related to goals / functional outcomes:  PT Long Term Goals - 01/08/20 0917      PT LONG TERM GOAL #1   Title Pt will be independent with progression of HEP for improved balance, strength, and gait.  For all LTGs:  UPDATED TARGET5/21/2021 (extended due to weeks remain in POC)    Time 8    Period Weeks    Status On-going      PT LONG TERM GOAL #2   Title Pt will improve TUG cognitive score to less than or equal to 15 seconds for decreased fall risk.    Baseline 19.12 sec 10/26/2019    Time 8    Period Weeks    Status On-going      PT LONG TERM GOAL #3   Title Pt will improve Berg score to at least 48/56 for decreased fall risk.    Baseline 38/56 10/26/2019; 43/56 12/07/2019; 39/56 01/08/2020    Time 8    Period Weeks    Status Not Met      PT LONG TERM GOAL #4   Title Pt will improve gait velocity to at least 2.62 ft/sec for improved gait efficiency and safety.    Baseline 1.3 ft/sec no device; 1.8 ft/sec rollator 01/08/2020    Time 8    Period Weeks    Status Not Met      PT LONG TERM GOAL #5   Title Pt will perform at least 8 of 10 reps of sit<>stand from 18" surfaces with no posterior lean, for improved transfers.    Time 8    Period Weeks    Status Not Met      PT LONG TERM GOAL #6   Title Pt will ambulate at least 500 ft, indoor and outdoor surfaces, for community ambulation, with appropriate assistive device and supervision.    Time 8    Period Weeks    Status On-going      PT LONG TERM GOAL #7   Title Pt will report 50% less "fullness in head/dizziness" with turns  and head turns, for improved balance.    Baseline Reports symptoms as 6/10, progressing to headache with 360 turn and step taps 12/07/2019; occasional reports of fullness in head with certain activities    Time 4    Period Weeks    Status Partially Met             Remaining deficits: Pt remains at fall risk per Berg, TUG, gait velocity scores; he continues to demonstrate decreased safety awareness, decreased safety and independence with transfers and gait.   Education / Equipment: Educated throughout course of therapy in HEP progression, fall prevention education.  Plan: Patient agrees to discharge.  Patient goals were not met. Patient is being discharged due to lack of progress.  ?????       Derhonda Eastlick W. 02/05/2020, 8:40 AM Frazier Butt., PT [facility] [address] [place], [place], [address] Phone: [phone]   Fax:  [phone]

## 2020-02-07 ENCOUNTER — Other Ambulatory Visit (HOSPITAL_COMMUNITY): Payer: Self-pay | Admitting: *Deleted

## 2020-02-07 MED ORDER — HYDROXYZINE PAMOATE 50 MG PO CAPS: 50.0000 mg | ORAL_CAPSULE | Freq: Every evening | ORAL | 1 refills | Status: DC

## 2020-02-07 MED ORDER — HYDROXYZINE HCL 25 MG PO TABS: 25.0000 mg | ORAL_TABLET | Freq: Every day | ORAL | 1 refills | Status: DC

## 2020-02-28 ENCOUNTER — Other Ambulatory Visit (HOSPITAL_COMMUNITY): Payer: Self-pay | Admitting: *Deleted

## 2020-02-28 ENCOUNTER — Telehealth (INDEPENDENT_AMBULATORY_CARE_PROVIDER_SITE_OTHER): Payer: Medicare Other | Admitting: Psychiatry

## 2020-02-28 ENCOUNTER — Other Ambulatory Visit: Payer: Self-pay

## 2020-02-28 DIAGNOSIS — F324 Major depressive disorder, single episode, in partial remission: Secondary | ICD-10-CM | POA: Diagnosis not present

## 2020-02-28 MED ORDER — ALPRAZOLAM 0.5 MG PO TABS: 0.5000 mg | ORAL_TABLET | Freq: Every evening | ORAL | 4 refills | Status: DC | PRN

## 2020-02-28 MED ORDER — TEMAZEPAM 15 MG PO CAPS: 15.0000 mg | ORAL_CAPSULE | Freq: Every evening | ORAL | 0 refills | Status: DC | PRN

## 2020-02-28 MED ORDER — BUPROPION HCL ER (XL) 300 MG PO TB24: 300.0000 mg | ORAL_TABLET | Freq: Every day | ORAL | 2 refills | Status: DC

## 2020-02-28 MED ORDER — ALPRAZOLAM 0.5 MG PO TABS
0.5000 mg | ORAL_TABLET | ORAL | 0 refills | Status: DC | PRN
Start: 2020-02-28 — End: 2020-02-28

## 2020-02-28 MED ORDER — HYDROXYZINE HCL 25 MG PO TABS: 25.0000 mg | ORAL_TABLET | Freq: Every day | ORAL | 1 refills | Status: DC

## 2020-02-28 NOTE — Progress Notes (Signed)
Patient ID: Benjamin Hahn, male   DOB: 1933/01/24, 86 y.o.   MRN: [id number] [facility] MD/PA/NP OP Progress Note  02/28/2020 2:57 PM Benjamin Hahn  MRN:  [id number]  Chief Complaint: Major depression remission Subjective: Doing great  Today the patient is doing fair.  He has 1 or 2 days that he gets depressed.  Otherwise he denies daily depression.  He is not really anxious either.  He works about 2 or 3 hours 4 days a week.  He feels very limited.  He can walk without a walker.  He no longer drives.  He is not going to move to [place].  The patient misses an old girlfriend that he was communicating with for about a year.  That is ended.  Patient says she has difficulty falling off to sleep.  But once asleep he sleeps well and attributed to the Restoril.  He still enjoying some things.  He is unhappy with his marriage.  He feels he does not keep busy and.  Feels like he should start writing again.  He does watch TV.  He is functioning fairly well.  He still goes to his office through the week.  Patient is not suicidal.  He denies chest pain or shortness of breath or any neurological symptoms.  Past Medical History:  Diagnosis Date  . Anxiety   . Arthritis    might be in back, no problems  . BPH (benign prostatic hyperplasia)   . Depression 1990s  . GERD (gastroesophageal reflux disease)    occasional  . Hypercholesteremia    controled  . Hypothyroid   . MI (myocardial infarction) ([place])   . Shingles May 2014   "Right face, still has some"  . Temporal arteritis ([place])   . Transient blindness of both eyes     Past Surgical History:  Procedure Laterality Date  . ARTERY BIOPSY Right 06/20/2013   Procedure: BIOPSY TEMPORAL ARTERY RIGHT;  Surgeon: Earnstine Regal, MD;  [facility];  Service: General;  Laterality: Right;  . CARDIAC CATHETERIZATION N/A 09/02/2015   Procedure: Left Heart Cath and Coronary Angiography;  Surgeon: Charolette Forward, MD;  Location: [place] [facility];  Service:  Cardiovascular;  Laterality: N/A;  . CARDIAC CATHETERIZATION N/A 09/02/2015   Procedure: Coronary Stent Intervention;  Surgeon: Charolette Forward, MD;  Location: [place] [facility];  Service: Cardiovascular;  Laterality: N/A;  1.  mid RCA      (3.0/28mm Xience) 2.  Mid LAD      (3.0/23mm Xience)  . CRANIOTOMY N/A 04/19/2019   Procedure: CRANIOTOMY HEMATOMA EVACUATION SUBDURAL;  Surgeon: Jovita Gamma, MD;  Location: [place];  Service: Neurosurgery;  Laterality: N/A;  CRANIOTOMY HEMATOMA EVACUATION SUBDURAL  . None      Family Psychiatric History:   Family History:  Family History  Problem Relation Age of Onset  . Stroke Father        Died, 80s  . Stroke Sister        Living, 32  . Heart disease Mother        Died, 65  . Healthy Daughter   . Diabetes Maternal Grandmother   . Hypertension Neg Hx     Social History:  Social History   Socioeconomic History  . Marital status: Married    Spouse name: Not on file  . Number of children: Not on file  . Years of education: Not on file  . Highest education level: Not on file  Occupational History  .  Not on file  Tobacco Use  . Smoking status: Former Smoker    Types: Cigarettes    Quit date: 08/24/1966    Years since quitting: 53.5  . Smokeless tobacco: Never Used  Substance and Sexual Activity  . Alcohol use: Not Currently    Comment: socially  . Drug use: Never  . Sexual activity: Not on file  Other Topics Concern  . Not on file  Social History Narrative   ** Merged History Encounter **       Currently works as a Midwife.  Lives with wife and they have two healthy daughters.   Social Determinants of Health   Financial Resource Strain:   . Difficulty of Paying Living Expenses:   Food Insecurity:   . Worried About Charity fundraiser in the Last Year:   . Arboriculturist in the Last Year:   Transportation Needs:   . Film/video editor (Medical):   [name] Lack of Transportation (Non-Medical):   Physical Activity:   .  Days of Exercise per Week:   . Minutes of Exercise per Session:   Stress:   . Feeling of Stress :   Social Connections:   . Frequency of Communication with Friends and Family:   . Frequency of Social Gatherings with Friends and Family:   . Attends Religious Services:   . Active Member of Clubs or Organizations:   . Attends Archivist Meetings:   [name] Marital Status:     Allergies:  Allergies  Allergen Reactions  . Ativan [Lorazepam] Anxiety    Per other chart in Epic  . Lorazepam Anxiety    Metabolic Disorder Labs: Lab Results  Component Value Date   HGBA1C 5.5 04/14/2019   No results found for: PROLACTIN Lab Results  Component Value Date   CHOL 126 06/01/2019   TRIG 91.0 06/01/2019   HDL 56.50 06/01/2019   CHOLHDL 2 06/01/2019   VLDL 18.2 06/01/2019   LDLCALC 51 06/01/2019   LDLCALC 52 07/28/2018     Current Medications: Current Outpatient Medications  Medication Sig Dispense Refill  . acetaminophen (TYLENOL) 325 MG tablet Take 1-2 tablets (325-650 mg total) by mouth every 4 (four) hours as needed for mild pain or headache.    [name] acetaminophen (TYLENOL) 325 MG tablet Take 325-650 mg by mouth every 6 (six) hours as needed for mild pain or headache.    . ALPRAZolam (XANAX) 0.5 MG tablet Take 1 tablet (0.5 mg total) by mouth at bedtime as needed for anxiety. 30 tablet 4  . ALPRAZolam (XANAX) 0.5 MG tablet Take 1 tablet (0.5 mg total) by mouth every 3 (three) hours as needed for anxiety. 60 tablet 0  . atorvastatin (LIPITOR) 40 MG tablet Take 1 tablet (40 mg total) by mouth daily at 6 PM. 30 tablet 3  . buPROPion (WELLBUTRIN XL) 300 MG 24 hr tablet Take 1 tablet (300 mg total) by mouth daily. 90 tablet 2  . HYDROcodone-acetaminophen (NORCO) 10-325 MG tablet     . hydrOXYzine (ATARAX/VISTARIL) 25 MG tablet Take 1 tablet (25 mg total) by mouth at bedtime. 30 tablet 1  . levothyroxine (SYNTHROID) 88 MCG tablet TAKE 1 TABLET BY MOUTH DAILY. 90 tablet 1  . lidocaine  (LIDODERM) 5 % Place 1 patch onto the skin daily. Remove & Discard patch within 12 hours or as directed by MD 30 patch 0  . metoprolol succinate (TOPROL-XL) 25 MG 24 hr tablet TAKE (1/2) TABLET DAILY. 15 tablet 0  .  Plecanatide (TRULANCE) 3 MG TABS Take 1 tablet by mouth daily. 90 tablet 1  . ramipril (ALTACE) 1.25 MG capsule Take 1.25 mg by mouth daily after supper.    . temazepam (RESTORIL) 15 MG capsule Take 1 capsule (15 mg total) by mouth at bedtime as needed for sleep. 30 capsule 0  . temazepam (RESTORIL) 15 MG capsule Take 1 capsule (15 mg total) by mouth at bedtime as needed for sleep. 30 capsule 0  . venlafaxine XR (EFFEXOR-XR) 37.5 MG 24 hr capsule Take 1 capsule (37.5 mg total) by mouth daily. 30 capsule 6   No current facility-administered medications for this visit.    Neurologic: Headache: No Seizure: No Paresthesias: No  Musculoskeletal: Strength & Muscle Tone: within normal limits Gait & Station: normal Patient leans: NA  Psychiatric Specialty Exam: ROS  There were no vitals taken for this visit.There is no height or weight on file to calculate BMI.  General Appearance: Fairly Groomed  Eye Contact:  Good  Speech:  Clear and Coherent  Volume:  Normal  Mood:  Euthymic  Affect:  Appropriate  Thought Process:  Coherent  Orientation:  Full (Time, Place, and Person)  Thought Content:  WDL  Suicidal Thoughts:  No  Homicidal Thoughts:  No  Memory:  NA  Judgement:  Good  Insight:  Good  Psychomotor Activity:  Normal  Concentration:  Good  Recall:  Good  Fund of Knowledge: Good  Language: Good  Akathisia:  No  Handed:  Right  AIMS (if indicated):    Assets:  Desire for Improvement  ADL's:  Intact  Cognition: WNL  Sleep:      Treatment/plan  This patient is #1 problem is major depression.  He seems to be doing fairly well taking Effexor 37.5 mg together with Wellbutrin 150 mg both in the morning.  His second problem is that of insomnia.  The patient will  switch off between Xanax 0.5 and Restoril 15 mg.  With this combination he says he sleeps well.  He says he is eating fairly well.  He goes to physical therapy regularly.  The patient has some fixation on an old girlfriend but generally this patient has minimal psychiatric symptomatology.  I think he is just trying to exhaust after having significant brain injuries.  Patient was given a telephone number to reach me for emergencies.  Patient should return to see me in approximately 6 to 7 weeks.  02/28/2020, 2:57 PM [facility] MD Progress Note  02/28/2020 2:57 PM Benjamin Hahn  MRN:  [id number] Subjective:  Feels well Principal Problem: Major depression, chronic, mild/residual Diagnosis:  Major depression, recurrent residual Today the patient is doing fairly well. He's been having some medical problems. He recently went for Thanksgiving to a length but unfortunately had a urinary tract infection. He also since then has been coughing and recently started on antibiotic. Emotionally he is fairly stable. He denies daily depression. He stays very active at work. He's been unable to exercise. The patient is sleeping well and has an increase in his appetite. His energy is good. Is no problems thinking or concentrating. He is recently having a productive cough but is no shortness of breath. He denies any chest pain. Is a stable relationship with his wife. His kidneys are good. He has 4 grandchildren. Everybody in his home is good. He only issue is that a number mornings he awakens and he feels oversedated. It occurs point of his wife feels uncomfortable with him driving will taken  to work. Today reviewed his medications and is evident that he likely needs reduction. He also notes that his memory has change in his and is good. 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 Patient Active Problem List   Diagnosis Date Noted  . Routine general medical examination at a health  care facility [Z00.00] 12/09/2019  . Hypotension [I95.9] 12/07/2019  . Psychophysiological insomnia [F51.04] 12/07/2019  . Deficiency anemia [D53.9] 06/01/2019  . Vitamin D deficiency [E55.9] 06/01/2019  . Chronic idiopathic constipation [K59.04] 06/01/2019  . Hyperglycemia [R73.9] 04/15/2019  . Mild intermittent asthma with acute exacerbation [J45.21] 02/08/2018  . Hypothyroidism [E03.9] 03/14/2013  . Hyperlipidemia LDL goal <70 [E78.5] 03/14/2013  . BPH (benign prostatic hypertrophy) [N40.0] 03/14/2013  . Hypogonadism, male [E29.1] 03/14/2013  . Erectile dysfunction [N52.9] 03/14/2013   Total Time spent with patient: 30 minutes  Past Psychiatric History:   Past Medical History:  Past Medical History:  Diagnosis Date  . Anxiety   . Arthritis    might be in back, no problems  . BPH (benign prostatic hyperplasia)   . Depression 1990s  . GERD (gastroesophageal reflux disease)    occasional  . Hypercholesteremia    controled  . Hypothyroid   . MI (myocardial infarction) ([place])   . Shingles May 2014   "Right face, still has some"  . Temporal arteritis ([place])   . Transient blindness of both eyes     Past Surgical History:  Procedure Laterality Date  . ARTERY BIOPSY Right 06/20/2013   Procedure: BIOPSY TEMPORAL ARTERY RIGHT;  Surgeon: Earnstine Regal, MD;  [facility];  Service: General;  Laterality: Right;  . CARDIAC CATHETERIZATION N/A 09/02/2015   Procedure: Left Heart Cath and Coronary Angiography;  Surgeon: Charolette Forward, MD;  Location: [place] [facility];  Service: Cardiovascular;  Laterality: N/A;  . CARDIAC CATHETERIZATION N/A 09/02/2015   Procedure: Coronary Stent Intervention;  Surgeon: Charolette Forward, MD;  Location: [place] [facility];  Service: Cardiovascular;  Laterality: N/A;  1.  mid RCA      (3.0/28mm Xience) 2.  Mid LAD      (3.0/23mm Xience)  . CRANIOTOMY N/A 04/19/2019   Procedure: CRANIOTOMY HEMATOMA EVACUATION SUBDURAL;  Surgeon: Jovita Gamma, MD;   Location: [employer];  Service: Neurosurgery;  Laterality: N/A;  CRANIOTOMY HEMATOMA EVACUATION SUBDURAL  . None     Family History:  Family History  Problem Relation Age of Onset  . Stroke Father        Died, 80s  . Stroke Sister        Living, 54  . Heart disease Mother        Died, 38  . Healthy Daughter   . Diabetes Maternal Grandmother   . Hypertension Neg Hx    Family Psychiatric  History:  Social History:  Social History   Substance and Sexual Activity  Alcohol Use Not Currently   Comment: socially     Social History   Substance and Sexual Activity  Drug Use Never    Social History   Socioeconomic History  . Marital status: Married    Spouse name: Not on file  . Number of children: Not on file  . Years of education: Not on file  . Highest education level: Not on file  Occupational History  . Not on file  Tobacco Use  . Smoking status: Former Smoker    Types: Cigarettes    Quit date: 08/24/1966  Years since quitting: 53.5  . Smokeless tobacco: Never Used  Substance and Sexual Activity  . Alcohol use: Not Currently    Comment: socially  . Drug use: Never  . Sexual activity: Not on file  Other Topics Concern  . Not on file  Social History Narrative   ** Merged History Encounter **       Currently works as a Midwife.  Lives with wife and they have two healthy daughters.   Social Determinants of Health   Financial Resource Strain:   . Difficulty of Paying Living Expenses:   Food Insecurity:   . Worried About Charity fundraiser in the Last Year:   . Arboriculturist in the Last Year:   Transportation Needs:   . Film/video editor (Medical):   [name] Lack of Transportation (Non-Medical):   Physical Activity:   . Days of Exercise per Week:   . Minutes of Exercise per Session:   Stress:   . Feeling of Stress :   Social Connections:   . Frequency of Communication with Friends and Family:   . Frequency of Social Gatherings with Friends and Family:    . Attends Religious Services:   . Active Member of Clubs or Organizations:   . Attends Archivist Meetings:   [name] Marital Status:    Additional Social History:                         Sleep: Good  Appetite:  Good  Current Medications: Current Outpatient Medications  Medication Sig Dispense Refill  . acetaminophen (TYLENOL) 325 MG tablet Take 1-2 tablets (325-650 mg total) by mouth every 4 (four) hours as needed for mild pain or headache.    [name] acetaminophen (TYLENOL) 325 MG tablet Take 325-650 mg by mouth every 6 (six) hours as needed for mild pain or headache.    . ALPRAZolam (XANAX) 0.5 MG tablet Take 1 tablet (0.5 mg total) by mouth at bedtime as needed for anxiety. 30 tablet 4  . ALPRAZolam (XANAX) 0.5 MG tablet Take 1 tablet (0.5 mg total) by mouth every 3 (three) hours as needed for anxiety. 60 tablet 0  . atorvastatin (LIPITOR) 40 MG tablet Take 1 tablet (40 mg total) by mouth daily at 6 PM. 30 tablet 3  . buPROPion (WELLBUTRIN XL) 300 MG 24 hr tablet Take 1 tablet (300 mg total) by mouth daily. 90 tablet 2  . HYDROcodone-acetaminophen (NORCO) 10-325 MG tablet     . hydrOXYzine (ATARAX/VISTARIL) 25 MG tablet Take 1 tablet (25 mg total) by mouth at bedtime. 30 tablet 1  . levothyroxine (SYNTHROID) 88 MCG tablet TAKE 1 TABLET BY MOUTH DAILY. 90 tablet 1  . lidocaine (LIDODERM) 5 % Place 1 patch onto the skin daily. Remove & Discard patch within 12 hours or as directed by MD 30 patch 0  . metoprolol succinate (TOPROL-XL) 25 MG 24 hr tablet TAKE (1/2) TABLET DAILY. 15 tablet 0  . Plecanatide (TRULANCE) 3 MG TABS Take 1 tablet by mouth daily. 90 tablet 1  . ramipril (ALTACE) 1.25 MG capsule Take 1.25 mg by mouth daily after supper.    . temazepam (RESTORIL) 15 MG capsule Take 1 capsule (15 mg total) by mouth at bedtime as needed for sleep. 30 capsule 0  . temazepam (RESTORIL) 15 MG capsule Take 1 capsule (15 mg total) by mouth at bedtime as needed for sleep. 30  capsule 0  . venlafaxine XR (  EFFEXOR-XR) 37.5 MG 24 hr capsule Take 1 capsule (37.5 mg total) by mouth daily. 30 capsule 6   No current facility-administered medications for this visit.    Lab Results:  No results found for this or any previous visit (from the past 48 hour(s)).  Physical Findings: AIMS:  , ,  ,  ,    CIWA:    COWS:     Musculoskeletal: Strength & Muscle Tone: within normal limits Gait & Station: normal Patient leans: Right  Psychiatric Specialty Exam: ROS  There were no vitals taken for this visit.There is no height or weight on file to calculate BMI.  General Appearance: Negative  Eye Contact::  Good  Speech:  Clear and Coherent  Volume:  Normal  Mood:  NA  Affect:  Appropriate  Thought Process:  Coherent  Orientation:  Full (Time, Place, and Person)  Thought Content:  WDL  Suicidal Thoughts:  No  Homicidal Thoughts:  No  Memory:  NA  Judgement:  Good  Insight:  Good  Psychomotor Activity:  Normal  Concentration:  Good  Recall:  Good  Fund of Knowledge:Good  Language: Good  Akathisia:  No  Handed:  Right  AIMS (if indicated):     Assets:  Desire for Improvement  ADL's:  Intact  Cognition: WNL  Sleep:      [facility] MD Progress Note  02/28/2020 2:57 PM Benjamin Hahn  MRN:  [id number] Subjective:  Feels well Principal Problem: Major depression, chronic, mild/residual Diagnosis:  Major depression, recurrent residual Today the patient is doing fairly well. He's been having some medical problems. He recently went for Thanksgiving to a length but unfortunately had a urinary tract infection. He also since then has been coughing and recently started on antibiotic. Emotionally he is fairly stable. He denies daily depression. He stays very active at work. He's been unable to exercise. The patient is sleeping well and has an increase in his appetite. His energy is good. Is no problems thinking or concentrating. He is recently having a  productive cough but is no shortness of breath. He denies any chest pain. Is a stable relationship with his wife. His kidneys are good. He has 4 grandchildren. Everybody in his home is good. He only issue is that a number mornings he awakens and he feels oversedated. It occurs point of his wife feels uncomfortable with him driving will taken to work. Today reviewed his medications and is evident that he likely needs reduction. He also notes that his memory has change in his and is good. 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 Patient Active Problem List   Diagnosis Date Noted  . Routine general medical examination at a health care facility [Z00.00] 12/09/2019  . Hypotension [I95.9] 12/07/2019  . Psychophysiological insomnia [F51.04] 12/07/2019  . Deficiency anemia [D53.9] 06/01/2019  . Vitamin D deficiency [E55.9] 06/01/2019  . Chronic idiopathic constipation [K59.04] 06/01/2019  . Hyperglycemia [R73.9] 04/15/2019  . Mild intermittent asthma with acute exacerbation [J45.21] 02/08/2018  . Hypothyroidism [E03.9] 03/14/2013  . Hyperlipidemia LDL goal <70 [E78.5] 03/14/2013  . BPH (benign prostatic hypertrophy) [N40.0] 03/14/2013  . Hypogonadism, male [E29.1] 03/14/2013  . Erectile dysfunction [N52.9] 03/14/2013   Total Time spent with patient: 30 minutes  Past Psychiatric History:   Past Medical History:  Past Medical History:  Diagnosis Date  . Anxiety   . Arthritis    might be in back, no problems  .  BPH (benign prostatic hyperplasia)   . Depression 1990s  . GERD (gastroesophageal reflux disease)    occasional  . Hypercholesteremia    controled  . Hypothyroid   . MI (myocardial infarction) ([place])   . Shingles May 2014   "Right face, still has some"  . Temporal arteritis ([place])   . Transient blindness of both eyes     Past Surgical History:  Procedure Laterality Date  . ARTERY BIOPSY Right 06/20/2013   Procedure: BIOPSY  TEMPORAL ARTERY RIGHT;  Surgeon: Earnstine Regal, MD;  [facility];  Service: General;  Laterality: Right;  . CARDIAC CATHETERIZATION N/A 09/02/2015   Procedure: Left Heart Cath and Coronary Angiography;  Surgeon: Charolette Forward, MD;  Location: [place] [facility];  Service: Cardiovascular;  Laterality: N/A;  . CARDIAC CATHETERIZATION N/A 09/02/2015   Procedure: Coronary Stent Intervention;  Surgeon: Charolette Forward, MD;  Location: [place] [facility];  Service: Cardiovascular;  Laterality: N/A;  1.  mid RCA      (3.0/28mm Xience) 2.  Mid LAD      (3.0/23mm Xience)  . CRANIOTOMY N/A 04/19/2019   Procedure: CRANIOTOMY HEMATOMA EVACUATION SUBDURAL;  Surgeon: Jovita Gamma, MD;  Location: [place];  Service: Neurosurgery;  Laterality: N/A;  CRANIOTOMY HEMATOMA EVACUATION SUBDURAL  . None     Family History:  Family History  Problem Relation Age of Onset  . Stroke Father        Died, 80s  . Stroke Sister        Living, 64  . Heart disease Mother        Died, 25  . Healthy Daughter   . Diabetes Maternal Grandmother   . Hypertension Neg Hx    Family Psychiatric  History:  Social History:  Social History   Substance and Sexual Activity  Alcohol Use Not Currently   Comment: socially     Social History   Substance and Sexual Activity  Drug Use Never    Social History   Socioeconomic History  . Marital status: Married    Spouse name: Not on file  . Number of children: Not on file  . Years of education: Not on file  . Highest education level: Not on file  Occupational History  . Not on file  Tobacco Use  . Smoking status: Former Smoker    Types: Cigarettes    Quit date: 08/24/1966    Years since quitting: 53.5  . Smokeless tobacco: Never Used  Substance and Sexual Activity  . Alcohol use: Not Currently    Comment: socially  . Drug use: Never  . Sexual activity: Not on file  Other Topics Concern  . Not on file  Social History Narrative   ** Merged History Encounter **        Currently works as a Midwife.  Lives with wife and they have two healthy daughters.   Social Determinants of Health   Financial Resource Strain:   . Difficulty of Paying Living Expenses:   Food Insecurity:   . Worried About Charity fundraiser in the Last Year:   . Arboriculturist in the Last Year:   Transportation Needs:   . Film/video editor (Medical):   [name] Lack of Transportation (Non-Medical):   Physical Activity:   . Days of Exercise per Week:   . Minutes of Exercise per Session:   Stress:   . Feeling of Stress :   Social Connections:   . Frequency of Communication  with Friends and Family:   . Frequency of Social Gatherings with Friends and Family:   . Attends Religious Services:   . Active Member of Clubs or Organizations:   . Attends Archivist Meetings:   [name] Marital Status:    Additional Social History:                         Sleep: Good  Appetite:  Good  Current Medications: Current Outpatient Medications  Medication Sig Dispense Refill  . acetaminophen (TYLENOL) 325 MG tablet Take 1-2 tablets (325-650 mg total) by mouth every 4 (four) hours as needed for mild pain or headache.    [name] acetaminophen (TYLENOL) 325 MG tablet Take 325-650 mg by mouth every 6 (six) hours as needed for mild pain or headache.    . ALPRAZolam (XANAX) 0.5 MG tablet Take 1 tablet (0.5 mg total) by mouth at bedtime as needed for anxiety. 30 tablet 4  . ALPRAZolam (XANAX) 0.5 MG tablet Take 1 tablet (0.5 mg total) by mouth every 3 (three) hours as needed for anxiety. 60 tablet 0  . atorvastatin (LIPITOR) 40 MG tablet Take 1 tablet (40 mg total) by mouth daily at 6 PM. 30 tablet 3  . buPROPion (WELLBUTRIN XL) 300 MG 24 hr tablet Take 1 tablet (300 mg total) by mouth daily. 90 tablet 2  . HYDROcodone-acetaminophen (NORCO) 10-325 MG tablet     . hydrOXYzine (ATARAX/VISTARIL) 25 MG tablet Take 1 tablet (25 mg total) by mouth at bedtime. 30 tablet 1  . levothyroxine  (SYNTHROID) 88 MCG tablet TAKE 1 TABLET BY MOUTH DAILY. 90 tablet 1  . lidocaine (LIDODERM) 5 % Place 1 patch onto the skin daily. Remove & Discard patch within 12 hours or as directed by MD 30 patch 0  . metoprolol succinate (TOPROL-XL) 25 MG 24 hr tablet TAKE (1/2) TABLET DAILY. 15 tablet 0  . Plecanatide (TRULANCE) 3 MG TABS Take 1 tablet by mouth daily. 90 tablet 1  . ramipril (ALTACE) 1.25 MG capsule Take 1.25 mg by mouth daily after supper.    . temazepam (RESTORIL) 15 MG capsule Take 1 capsule (15 mg total) by mouth at bedtime as needed for sleep. 30 capsule 0  . temazepam (RESTORIL) 15 MG capsule Take 1 capsule (15 mg total) by mouth at bedtime as needed for sleep. 30 capsule 0  . venlafaxine XR (EFFEXOR-XR) 37.5 MG 24 hr capsule Take 1 capsule (37.5 mg total) by mouth daily. 30 capsule 6   No current facility-administered medications for this visit.    Lab Results:  No results found for this or any previous visit (from the past 48 hour(s)).  Physical Findings: AIMS:  , ,  ,  ,    CIWA:    COWS:     Musculoskeletal: Strength & Muscle Tone: within normal limits Gait & Station: normal Patient leans: Right  Psychiatric Specialty Exam: ROS  There were no vitals taken for this visit.There is no height or weight on file to calculate BMI.  General Appearance: Negative  Eye Contact::  Good  Speech:  Clear and Coherent  Volume:  Normal  Mood:  NA  Affect:  Appropriate  Thought Process:  Coherent  Orientation:  Full (Time, Place, and Person)  Thought Content:  WDL  Suicidal Thoughts:  No  Homicidal Thoughts:  No  Memory:  NA  Judgement:  Good  Insight:  Good  Psychomotor Activity:  Normal  Concentration:  Good  Recall:  Roel Cluck of Knowledge:Good  Language: Good  Akathisia:  No  Handed:  Right  AIMS (if indicated):     Assets:  Desire for Improvement  ADL's:  Intact  Cognition: WNL  Sleep:      Treatment Plan Summary: Today the patient has 2 problems.  His  first is that of major depression.  He takes a low-dose of Effexor 37.5 mg together with 150 mg of Wellbutrin.  He is not depressed on a regular basis.  He is not anhedonic.  He is not suicidal.  His second problem is problems with sleep.  He takes Restoril 15 mg at night which helps him stay asleep but he cannot fall asleep.  Spent over an hour up.  Today I asked him to take the Restoril at night and if he does not fall asleep within a half an hour to then take a Xanax 0.5 mg.  Is not clear how often he takes the Vistaril but that is mainly used for anxiety.  This patient will be seen again in 3 months.

## 2020-04-05 ENCOUNTER — Telehealth: Payer: Self-pay | Admitting: Internal Medicine

## 2020-04-05 NOTE — Telephone Encounter (Signed)
New Message:   Pt's spouse is calling and states they received a bill for labs rendered of $179.00 for dos of 12/07/19. She states that it is stating that the doctor didn't request these labs or the appt didn't match the labs ordered. She states they are not paying this bill and states it was billed incorrectly to insurance. Please advise.

## 2020-04-11 DIAGNOSIS — Z23 Encounter for immunization: Secondary | ICD-10-CM | POA: Diagnosis not present

## 2020-05-28 DIAGNOSIS — Z23 Encounter for immunization: Secondary | ICD-10-CM | POA: Diagnosis not present

## 2020-05-29 ENCOUNTER — Other Ambulatory Visit: Payer: Self-pay | Admitting: Internal Medicine

## 2020-06-05 ENCOUNTER — Telehealth (INDEPENDENT_AMBULATORY_CARE_PROVIDER_SITE_OTHER): Payer: Medicare Other | Admitting: Psychiatry

## 2020-06-05 ENCOUNTER — Other Ambulatory Visit: Payer: Self-pay

## 2020-06-05 DIAGNOSIS — F324 Major depressive disorder, single episode, in partial remission: Secondary | ICD-10-CM | POA: Diagnosis not present

## 2020-06-05 DIAGNOSIS — M7581 Other shoulder lesions, right shoulder: Secondary | ICD-10-CM | POA: Diagnosis not present

## 2020-06-05 MED ORDER — TEMAZEPAM 15 MG PO CAPS: 15.0000 mg | ORAL_CAPSULE | Freq: Every evening | ORAL | 4 refills | Status: DC | PRN

## 2020-06-05 MED ORDER — ALPRAZOLAM 0.5 MG PO TABS
ORAL_TABLET | ORAL | 3 refills | Status: DC
Start: 2020-06-05 — End: 2020-09-04

## 2020-06-05 MED ORDER — VENLAFAXINE HCL ER 37.5 MG PO CP24: 37.5000 mg | ORAL_CAPSULE | Freq: Every day | ORAL | 6 refills | Status: DC

## 2020-06-05 MED ORDER — HYDROXYZINE HCL 25 MG PO TABS: 25.0000 mg | ORAL_TABLET | Freq: Every day | ORAL | 1 refills | Status: DC

## 2020-06-05 MED ORDER — TEMAZEPAM 15 MG PO CAPS: 15.0000 mg | ORAL_CAPSULE | Freq: Every evening | ORAL | 0 refills | Status: DC | PRN

## 2020-06-05 NOTE — Progress Notes (Signed)
Patient ID: Benjamin Hahn, male   DOB: 01-02-1933, 86 y.o.   MRN: [id number] [facility] MD/PA/NP OP Progress Note  06/05/2020 3:56 PM Benjamin Hahn  MRN:  [id number]  Chief Complaint: Major depression remission Subjective: Doing great  Today patient is doing better.  He says his mood is good.  As usual there is some confusion about the different medicines he is on.  He will continue taking Effexor 37.5 mg and Wellbutrin 150 mg.  For reasons that are not clear the patient also is taking some Xanax at night.  He says the Restoril works really well and he sleeps well.  He is eating well.  He says he works 4 days a week.  The patient's mood seems to be pretty good.  He is mainly focused on his medications.  We talked about Vistaril which she seems to have problems understanding that he can take for anxiety.  The patient says that he takes Xanax at night but then implies that he takes it for anxiety.  He clarified with him that he should not take it anymore if none taken only during the day only at half as needed.  The be given 15.5 mg pills which are less than 1 month.  Patient is not suicidal.  He drinks no alcohol.  Past Medical History:  Diagnosis Date  . Anxiety   . Arthritis    might be in back, no problems  . BPH (benign prostatic hyperplasia)   . Depression 1990s  . GERD (gastroesophageal reflux disease)    occasional  . Hypercholesteremia    controled  . Hypothyroid   . MI (myocardial infarction) ([place])   . Shingles May 2014   "Right face, still has some"  . Temporal arteritis ([place])   . Transient blindness of both eyes     Past Surgical History:  Procedure Laterality Date  . ARTERY BIOPSY Right 06/20/2013   Procedure: BIOPSY TEMPORAL ARTERY RIGHT;  Surgeon: Earnstine Regal, MD;  [facility]: [facility];  Service: General;  Laterality: Right;  . CARDIAC CATHETERIZATION N/A 09/02/2015   Procedure: Left Heart Cath and Coronary Angiography;  Surgeon: Charolette Forward, MD;  Location: [place] [facility];  Service: Cardiovascular;  Laterality: N/A;  . CARDIAC CATHETERIZATION N/A 09/02/2015   Procedure: Coronary Stent Intervention;  Surgeon: Charolette Forward, MD;  Location: [place] [facility];  Service: Cardiovascular;  Laterality: N/A;  1.  mid RCA      (3.0/28mm Xience) 2.  Mid LAD      (3.0/23mm Xience)  . CRANIOTOMY N/A 04/19/2019   Procedure: CRANIOTOMY HEMATOMA EVACUATION SUBDURAL;  Surgeon: Jovita Gamma, MD;  Location: [place];  Service: Neurosurgery;  Laterality: N/A;  CRANIOTOMY HEMATOMA EVACUATION SUBDURAL  . None      Family Psychiatric History:   Family History:  Family History  Problem Relation Age of Onset  . Stroke Father        Died, 80s  . Stroke Sister        Living, 70  . Heart disease Mother        Died, 3  . Healthy Daughter   . Diabetes Maternal Grandmother   . Hypertension Neg Hx     Social History:  Social History   Socioeconomic History  . Marital status: Married    Spouse name: Not on file  . Number of children: Not on file  . Years of education: Not on file  . Highest education level: Not on file  Occupational History  .  Not on file  Tobacco Use  . Smoking status: Former Smoker    Types: Cigarettes    Quit date: 08/24/1966    Years since quitting: 53.8  . Smokeless tobacco: Never Used  Substance and Sexual Activity  . Alcohol use: Not Currently    Comment: socially  . Drug use: Never  . Sexual activity: Not on file  Other Topics Concern  . Not on file  Social History Narrative   ** Merged History Encounter **       Currently works as a Midwife.  Lives with wife and they have two healthy daughters.   Social Determinants of Health   Financial Resource Strain:   . Difficulty of Paying Living Expenses: Not on file  Food Insecurity:   . Worried About Charity fundraiser in the Last Year: Not on file  . Ran Out of Food in the Last Year: Not on file  Transportation Needs:   . Lack of Transportation (Medical): Not on file  . Lack  of Transportation (Non-Medical): Not on file  Physical Activity:   . Days of Exercise per Week: Not on file  . Minutes of Exercise per Session: Not on file  Stress:   . Feeling of Stress : Not on file  Social Connections:   . Frequency of Communication with Friends and Family: Not on file  . Frequency of Social Gatherings with Friends and Family: Not on file  . Attends Religious Services: Not on file  . Active Member of Clubs or Organizations: Not on file  . Attends Archivist Meetings: Not on file  . Marital Status: Not on file    Allergies:  Allergies  Allergen Reactions  . Ativan [Lorazepam] Anxiety    Per other chart in Epic  . Lorazepam Anxiety    Metabolic Disorder Labs: Lab Results  Component Value Date   HGBA1C 5.5 04/14/2019   No results found for: PROLACTIN Lab Results  Component Value Date   CHOL 126 06/01/2019   TRIG 91.0 06/01/2019   HDL 56.50 06/01/2019   CHOLHDL 2 06/01/2019   VLDL 18.2 06/01/2019   LDLCALC 51 06/01/2019   LDLCALC 52 07/28/2018     Current Medications: Current Outpatient Medications  Medication Sig Dispense Refill  . acetaminophen (TYLENOL) 325 MG tablet Take 1-2 tablets (325-650 mg total) by mouth every 4 (four) hours as needed for mild pain or headache.    [name] acetaminophen (TYLENOL) 325 MG tablet Take 325-650 mg by mouth every 6 (six) hours as needed for mild pain or headache.    . ALPRAZolam (XANAX) 0.5 MG tablet Half q day  prn 15 tablet 3  . atorvastatin (LIPITOR) 40 MG tablet Take 1 tablet (40 mg total) by mouth daily at 6 PM. 30 tablet 3  . buPROPion (WELLBUTRIN XL) 300 MG 24 hr tablet Take 1 tablet (300 mg total) by mouth daily. 90 tablet 2  . HYDROcodone-acetaminophen (NORCO) 10-325 MG tablet     . hydrOXYzine (ATARAX/VISTARIL) 25 MG tablet Take 1 tablet (25 mg total) by mouth at bedtime. 30 tablet 1  . levothyroxine (SYNTHROID) 88 MCG tablet TAKE 1 TABLET BY MOUTH DAILY. 30 tablet 0  . lidocaine (LIDODERM) 5 % Place  1 patch onto the skin daily. Remove & Discard patch within 12 hours or as directed by MD 30 patch 0  . metoprolol succinate (TOPROL-XL) 25 MG 24 hr tablet TAKE (1/2) TABLET DAILY. 15 tablet 0  . Plecanatide (TRULANCE) 3 MG TABS  Take 1 tablet by mouth daily. 90 tablet 1  . ramipril (ALTACE) 1.25 MG capsule Take 1.25 mg by mouth daily after supper.    . temazepam (RESTORIL) 15 MG capsule Take 1 capsule (15 mg total) by mouth at bedtime as needed for sleep. 30 capsule 0  . temazepam (RESTORIL) 15 MG capsule Take 1 capsule (15 mg total) by mouth at bedtime as needed for sleep. 30 capsule 4  . venlafaxine XR (EFFEXOR-XR) 37.5 MG 24 hr capsule Take 1 capsule (37.5 mg total) by mouth daily. 30 capsule 6   No current facility-administered medications for this visit.    Neurologic: Headache: No Seizure: No Paresthesias: No  Musculoskeletal: Strength & Muscle Tone: within normal limits Gait & Station: normal Patient leans: NA  Psychiatric Specialty Exam: ROS  There were no vitals taken for this visit.There is no height or weight on file to calculate BMI.  General Appearance: Fairly Groomed  Eye Contact:  Good  Speech:  Clear and Coherent  Volume:  Normal  Mood:  Euthymic  Affect:  Appropriate  Thought Process:  Coherent  Orientation:  Full (Time, Place, and Person)  Thought Content:  WDL  Suicidal Thoughts:  No  Homicidal Thoughts:  No  Memory:  NA  Judgement:  Good  Insight:  Good  Psychomotor Activity:  Normal  Concentration:  Good  Recall:  Good  Fund of Knowledge: Good  Language: Good  Akathisia:  No  Handed:  Right  AIMS (if indicated):    Assets:  Desire for Improvement  ADL's:  Intact  Cognition: WNL  Sleep:      Treatment/plan  This patient is #1 problem is major depression.  He seems to be doing fairly well taking Effexor 37.5 mg together with Wellbutrin 150 mg both in the morning.  His second problem is that of insomnia.  The patient will switch off between Xanax  0.5 and Restoril 15 mg.  With this combination he says he sleeps well.  He says he is eating fairly well.  He goes to physical therapy regularly.  The patient has some fixation on an old girlfriend but generally this patient has minimal psychiatric symptomatology.  I think he is just trying to exhaust after having significant brain injuries.  Patient was given a telephone number to reach me for emergencies.  Patient should return to see me in approximately 6 to 7 weeks.  06/05/2020, 3:56 PM [facility] MD Progress Note  06/05/2020 3:56 PM Benjamin Hahn  MRN:  [id number] Subjective:  Feels well Principal Problem: Major depression, chronic, mild/residual Diagnosis:  Major depression, recurrent residual Today the patient is doing fairly well. He's been having some medical problems. He recently went for Thanksgiving to a length but unfortunately had a urinary tract infection. He also since then has been coughing and recently started on antibiotic. Emotionally he is fairly stable. He denies daily depression. He stays very active at work. He's been unable to exercise. The patient is sleeping well and has an increase in his appetite. His energy is good. Is no problems thinking or concentrating. He is recently having a productive cough but is no shortness of breath. He denies any chest pain. Is a stable relationship with his wife. His kidneys are good. He has 4 grandchildren. Everybody in his home is good. He only issue is that a number mornings he awakens and he feels oversedated. It occurs point of his wife feels uncomfortable with him driving will taken to work. Today reviewed his  medications and is evident that he likely needs reduction. He also notes that his memory has change in his and is good. 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 Patient Active Problem List   Diagnosis Date Noted  . Routine general medical examination at a health care facility [Z00.00]  12/09/2019  . Hypotension [I95.9] 12/07/2019  . Psychophysiological insomnia [F51.04] 12/07/2019  . Deficiency anemia [D53.9] 06/01/2019  . Vitamin D deficiency [E55.9] 06/01/2019  . Chronic idiopathic constipation [K59.04] 06/01/2019  . Hyperglycemia [R73.9] 04/15/2019  . Mild intermittent asthma with acute exacerbation [J45.21] 02/08/2018  . Hypothyroidism [E03.9] 03/14/2013  . Hyperlipidemia LDL goal <70 [E78.5] 03/14/2013  . BPH (benign prostatic hypertrophy) [N40.0] 03/14/2013  . Hypogonadism, male [E29.1] 03/14/2013  . Erectile dysfunction [N52.9] 03/14/2013   Total Time spent with patient: 30 minutes  Past Psychiatric History:   Past Medical History:  Past Medical History:  Diagnosis Date  . Anxiety   . Arthritis    might be in back, no problems  . BPH (benign prostatic hyperplasia)   . Depression 1990s  . GERD (gastroesophageal reflux disease)    occasional  . Hypercholesteremia    controled  . Hypothyroid   . MI (myocardial infarction) ([place])   . Shingles May 2014   "Right face, still has some"  . Temporal arteritis ([place])   . Transient blindness of both eyes     Past Surgical History:  Procedure Laterality Date  . ARTERY BIOPSY Right 06/20/2013   Procedure: BIOPSY TEMPORAL ARTERY RIGHT;  Surgeon: Earnstine Regal, MD;  [facility];  Service: General;  Laterality: Right;  . CARDIAC CATHETERIZATION N/A 09/02/2015   Procedure: Left Heart Cath and Coronary Angiography;  Surgeon: Charolette Forward, MD;  Location: [place] [facility];  Service: Cardiovascular;  Laterality: N/A;  . CARDIAC CATHETERIZATION N/A 09/02/2015   Procedure: Coronary Stent Intervention;  Surgeon: Charolette Forward, MD;  Location: [place] [facility];  Service: Cardiovascular;  Laterality: N/A;  1.  mid RCA      (3.0/28mm Xience) 2.  Mid LAD      (3.0/23mm Xience)  . CRANIOTOMY N/A 04/19/2019   Procedure: CRANIOTOMY HEMATOMA EVACUATION SUBDURAL;  Surgeon: Jovita Gamma, MD;  Location: [place];  Service:  Neurosurgery;  Laterality: N/A;  CRANIOTOMY HEMATOMA EVACUATION SUBDURAL  . None     Family History:  Family History  Problem Relation Age of Onset  . Stroke Father        Died, 80s  . Stroke Sister        Living, 25  . Heart disease Mother        Died, 61  . Healthy Daughter   . Diabetes Maternal Grandmother   . Hypertension Neg Hx    Family Psychiatric  History:  Social History:  Social History   Substance and Sexual Activity  Alcohol Use Not Currently   Comment: socially     Social History   Substance and Sexual Activity  Drug Use Never    Social History   Socioeconomic History  . Marital status: Married    Spouse name: Not on file  . Number of children: Not on file  . Years of education: Not on file  . Highest education level: Not on file  Occupational History  . Not on file  Tobacco Use  . Smoking status: Former Smoker    Types: Cigarettes    Quit date: 08/24/1966    Years since  quitting: 53.8  . Smokeless tobacco: Never Used  Substance and Sexual Activity  . Alcohol use: Not Currently    Comment: socially  . Drug use: Never  . Sexual activity: Not on file  Other Topics Concern  . Not on file  Social History Narrative   ** Merged History Encounter **       Currently works as a Midwife.  Lives with wife and they have two healthy daughters.   Social Determinants of Health   Financial Resource Strain:   . Difficulty of Paying Living Expenses: Not on file  Food Insecurity:   . Worried About Charity fundraiser in the Last Year: Not on file  . Ran Out of Food in the Last Year: Not on file  Transportation Needs:   . Lack of Transportation (Medical): Not on file  . Lack of Transportation (Non-Medical): Not on file  Physical Activity:   . Days of Exercise per Week: Not on file  . Minutes of Exercise per Session: Not on file  Stress:   . Feeling of Stress : Not on file  Social Connections:   . Frequency of Communication with Friends and Family:  Not on file  . Frequency of Social Gatherings with Friends and Family: Not on file  . Attends Religious Services: Not on file  . Active Member of Clubs or Organizations: Not on file  . Attends Archivist Meetings: Not on file  . Marital Status: Not on file   Additional Social History:                         Sleep: Good  Appetite:  Good  Current Medications: Current Outpatient Medications  Medication Sig Dispense Refill  . acetaminophen (TYLENOL) 325 MG tablet Take 1-2 tablets (325-650 mg total) by mouth every 4 (four) hours as needed for mild pain or headache.    [name] acetaminophen (TYLENOL) 325 MG tablet Take 325-650 mg by mouth every 6 (six) hours as needed for mild pain or headache.    . ALPRAZolam (XANAX) 0.5 MG tablet Half q day  prn 15 tablet 3  . atorvastatin (LIPITOR) 40 MG tablet Take 1 tablet (40 mg total) by mouth daily at 6 PM. 30 tablet 3  . buPROPion (WELLBUTRIN XL) 300 MG 24 hr tablet Take 1 tablet (300 mg total) by mouth daily. 90 tablet 2  . HYDROcodone-acetaminophen (NORCO) 10-325 MG tablet     . hydrOXYzine (ATARAX/VISTARIL) 25 MG tablet Take 1 tablet (25 mg total) by mouth at bedtime. 30 tablet 1  . levothyroxine (SYNTHROID) 88 MCG tablet TAKE 1 TABLET BY MOUTH DAILY. 30 tablet 0  . lidocaine (LIDODERM) 5 % Place 1 patch onto the skin daily. Remove & Discard patch within 12 hours or as directed by MD 30 patch 0  . metoprolol succinate (TOPROL-XL) 25 MG 24 hr tablet TAKE (1/2) TABLET DAILY. 15 tablet 0  . Plecanatide (TRULANCE) 3 MG TABS Take 1 tablet by mouth daily. 90 tablet 1  . ramipril (ALTACE) 1.25 MG capsule Take 1.25 mg by mouth daily after supper.    . temazepam (RESTORIL) 15 MG capsule Take 1 capsule (15 mg total) by mouth at bedtime as needed for sleep. 30 capsule 0  . temazepam (RESTORIL) 15 MG capsule Take 1 capsule (15 mg total) by mouth at bedtime as needed for sleep. 30 capsule 4  . venlafaxine XR (EFFEXOR-XR) 37.5 MG 24 hr capsule  Take 1 capsule (  37.5 mg total) by mouth daily. 30 capsule 6   No current facility-administered medications for this visit.    Lab Results:  No results found for this or any previous visit (from the past 48 hour(s)).  Physical Findings: AIMS:  , ,  ,  ,    CIWA:    COWS:     Musculoskeletal: Strength & Muscle Tone: within normal limits Gait & Station: normal Patient leans: Right  Psychiatric Specialty Exam: ROS  There were no vitals taken for this visit.There is no height or weight on file to calculate BMI.  General Appearance: Negative  Eye Contact::  Good  Speech:  Clear and Coherent  Volume:  Normal  Mood:  NA  Affect:  Appropriate  Thought Process:  Coherent  Orientation:  Full (Time, Place, and Person)  Thought Content:  WDL  Suicidal Thoughts:  No  Homicidal Thoughts:  No  Memory:  NA  Judgement:  Good  Insight:  Good  Psychomotor Activity:  Normal  Concentration:  Good  Recall:  Good  Fund of Knowledge:Good  Language: Good  Akathisia:  No  Handed:  Right  AIMS (if indicated):     Assets:  Desire for Improvement  ADL's:  Intact  Cognition: WNL  Sleep:      [facility]. [facility] MD Progress Note  06/05/2020 3:56 PM Benjamin Hahn  MRN:  [id number] Subjective:  Feels well Principal Problem: Major depression, chronic, mild/residual Diagnosis:  Major depression, recurrent residual Today the patient is doing fairly well. He's been having some medical problems. He recently went for Thanksgiving to a length but unfortunately had a urinary tract infection. He also since then has been coughing and recently started on antibiotic. Emotionally he is fairly stable. He denies daily depression. He stays very active at work. He's been unable to exercise. The patient is sleeping well and has an increase in his appetite. His energy is good. Is no problems thinking or concentrating. He is recently having a productive cough but is no shortness of breath. He denies any  chest pain. Is a stable relationship with his wife. His kidneys are good. He has 4 grandchildren. Everybody in his home is good. He only issue is that a number mornings he awakens and he feels oversedated. It occurs point of his wife feels uncomfortable with him driving will taken to work. Today reviewed his medications and is evident that he likely needs reduction. He also notes that his memory has change in his and is good. 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 Patient Active Problem List   Diagnosis Date Noted  . Routine general medical examination at a health care facility [Z00.00] 12/09/2019  . Hypotension [I95.9] 12/07/2019  . Psychophysiological insomnia [F51.04] 12/07/2019  . Deficiency anemia [D53.9] 06/01/2019  . Vitamin D deficiency [E55.9] 06/01/2019  . Chronic idiopathic constipation [K59.04] 06/01/2019  . Hyperglycemia [R73.9] 04/15/2019  . Mild intermittent asthma with acute exacerbation [J45.21] 02/08/2018  . Hypothyroidism [E03.9] 03/14/2013  . Hyperlipidemia LDL goal <70 [E78.5] 03/14/2013  . BPH (benign prostatic hypertrophy) [N40.0] 03/14/2013  . Hypogonadism, male [E29.1] 03/14/2013  . Erectile dysfunction [N52.9] 03/14/2013   Total Time spent with patient: 30 minutes  Past Psychiatric History:   Past Medical History:  Past Medical History:  Diagnosis Date  . Anxiety   . Arthritis    might be in back, no problems  . BPH (benign prostatic hyperplasia)   . Depression  1990s  . GERD (gastroesophageal reflux disease)    occasional  . Hypercholesteremia    controled  . Hypothyroid   . MI (myocardial infarction) ([place])   . Shingles May 2014   "Right face, still has some"  . Temporal arteritis ([place])   . Transient blindness of both eyes     Past Surgical History:  Procedure Laterality Date  . ARTERY BIOPSY Right 06/20/2013   Procedure: BIOPSY TEMPORAL ARTERY RIGHT;  Surgeon: Earnstine Regal, MD;  [facility];  Service: General;  Laterality: Right;  . CARDIAC CATHETERIZATION N/A 09/02/2015   Procedure: Left Heart Cath and Coronary Angiography;  Surgeon: Charolette Forward, MD;  Location: [place] [facility];  Service: Cardiovascular;  Laterality: N/A;  . CARDIAC CATHETERIZATION N/A 09/02/2015   Procedure: Coronary Stent Intervention;  Surgeon: Charolette Forward, MD;  Location: [place] [facility];  Service: Cardiovascular;  Laterality: N/A;  1.  mid RCA      (3.0/28mm Xience) 2.  Mid LAD      (3.0/23mm Xience)  . CRANIOTOMY N/A 04/19/2019   Procedure: CRANIOTOMY HEMATOMA EVACUATION SUBDURAL;  Surgeon: Jovita Gamma, MD;  Location: [place];  Service: Neurosurgery;  Laterality: N/A;  CRANIOTOMY HEMATOMA EVACUATION SUBDURAL  . None     Family History:  Family History  Problem Relation Age of Onset  . Stroke Father        Died, 80s  . Stroke Sister        Living, 7  . Heart disease Mother        Died, 55  . Healthy Daughter   . Diabetes Maternal Grandmother   . Hypertension Neg Hx    Family Psychiatric  History:  Social History:  Social History   Substance and Sexual Activity  Alcohol Use Not Currently   Comment: socially     Social History   Substance and Sexual Activity  Drug Use Never    Social History   Socioeconomic History  . Marital status: Married    Spouse name: Not on file  . Number of children: Not on file  . Years of education: Not on file  . Highest education level: Not on file  Occupational History  . Not on file  Tobacco Use  . Smoking status: Former Smoker    Types: Cigarettes    Quit date: 08/24/1966    Years since quitting: 53.8  . Smokeless tobacco: Never Used  Substance and Sexual Activity  . Alcohol use: Not Currently    Comment: socially  . Drug use: Never  . Sexual activity: Not on file  Other Topics Concern  . Not on file  Social History Narrative   ** Merged History Encounter **       Currently works as a Midwife.  Lives with wife and they have  two healthy daughters.   Social Determinants of Health   Financial Resource Strain:   . Difficulty of Paying Living Expenses: Not on file  Food Insecurity:   . Worried About Charity fundraiser in the Last Year: Not on file  . Ran Out of Food in the Last Year: Not on file  Transportation Needs:   . Lack of Transportation (Medical): Not on file  . Lack of Transportation (Non-Medical): Not on file  Physical Activity:   . Days of Exercise per Week: Not on file  . Minutes of Exercise per Session: Not on file  Stress:   . Feeling of Stress : Not on file  Social Connections:   . Frequency of Communication with Friends and Family: Not on file  . Frequency of Social Gatherings with Friends and Family: Not on file  . Attends Religious Services: Not on file  . Active Member of Clubs or Organizations: Not on file  . Attends Archivist Meetings: Not on file  . Marital Status: Not on file   Additional Social History:                         Sleep: Good  Appetite:  Good  Current Medications: Current Outpatient Medications  Medication Sig Dispense Refill  . acetaminophen (TYLENOL) 325 MG tablet Take 1-2 tablets (325-650 mg total) by mouth every 4 (four) hours as needed for mild pain or headache.    [name] acetaminophen (TYLENOL) 325 MG tablet Take 325-650 mg by mouth every 6 (six) hours as needed for mild pain or headache.    . ALPRAZolam (XANAX) 0.5 MG tablet Half q day  prn 15 tablet 3  . atorvastatin (LIPITOR) 40 MG tablet Take 1 tablet (40 mg total) by mouth daily at 6 PM. 30 tablet 3  . buPROPion (WELLBUTRIN XL) 300 MG 24 hr tablet Take 1 tablet (300 mg total) by mouth daily. 90 tablet 2  . HYDROcodone-acetaminophen (NORCO) 10-325 MG tablet     . hydrOXYzine (ATARAX/VISTARIL) 25 MG tablet Take 1 tablet (25 mg total) by mouth at bedtime. 30 tablet 1  . levothyroxine (SYNTHROID) 88 MCG tablet TAKE 1 TABLET BY MOUTH DAILY. 30 tablet 0  . lidocaine (LIDODERM) 5 % Place 1  patch onto the skin daily. Remove & Discard patch within 12 hours or as directed by MD 30 patch 0  . metoprolol succinate (TOPROL-XL) 25 MG 24 hr tablet TAKE (1/2) TABLET DAILY. 15 tablet 0  . Plecanatide (TRULANCE) 3 MG TABS Take 1 tablet by mouth daily. 90 tablet 1  . ramipril (ALTACE) 1.25 MG capsule Take 1.25 mg by mouth daily after supper.    . temazepam (RESTORIL) 15 MG capsule Take 1 capsule (15 mg total) by mouth at bedtime as needed for sleep. 30 capsule 0  . temazepam (RESTORIL) 15 MG capsule Take 1 capsule (15 mg total) by mouth at bedtime as needed for sleep. 30 capsule 4  . venlafaxine XR (EFFEXOR-XR) 37.5 MG 24 hr capsule Take 1 capsule (37.5 mg total) by mouth daily. 30 capsule 6   No current facility-administered medications for this visit.    Lab Results:  No results found for this or any previous visit (from the past 48 hour(s)).  Physical Findings: AIMS:  , ,  ,  ,    CIWA:    COWS:     Musculoskeletal: Strength & Muscle Tone: within normal limits Gait & Station: normal Patient leans: Right  Psychiatric Specialty Exam: ROS  There were no vitals taken for this visit.There is no height or weight on file to calculate BMI.  General Appearance: Negative  Eye Contact::  Good  Speech:  Clear and Coherent  Volume:  Normal  Mood:  NA  Affect:  Appropriate  Thought Process:  Coherent  Orientation:  Full (Time, Place, and Person)  Thought Content:  WDL  Suicidal Thoughts:  No  Homicidal Thoughts:  No  Memory:  NA  Judgement:  Good  Insight:  Good  Psychomotor Activity:  Normal  Concentration:  Good  Recall:  Good  Fund of Knowledge:Good  Language: Good  Akathisia:  No  Handed:  Right  AIMS (if indicated):     Assets:  Desire for Improvement  ADL's:  Intact  Cognition: WNL  Sleep:      Treatment Plan Summary: This patient's first problem is that of major depression.  He will continue taking Wellbutrin 150 mg and Effexor as prescribed.  His second problem  is that of insomnia.  The patient is doing very well taking Restoril and will continue taking that.  He has episodic anxiety.  For that he takes Xanax 0.5 mg a half as needed.  I shared with him that he can also take Vistaril instead of the Xanax.  In reality I do not mind if he takes some Vistaril and Xanax both of them on a as needed basis.  Will stay on Restoril for sleep.  To be seen again in a few months.  The patient actually is doing better than usual.

## 2020-06-19 IMAGING — CT CT HEAD W/O CM
2 of 4 series · 12 of 47 positions shown, 15 images · non-contrast
Comparison: CT 04/21/2019, MRI 04/17/2019

CLINICAL DATA: Follow-up craniotomy for subdural hematoma

EXAM:
CT HEAD WITHOUT CONTRAST
TECHNIQUE: Contiguous axial images were obtained from the base of the skull
through the vertex without intravenous contrast.

[Series 5: head 3.0 mpr cor · coronal · 0.35mm/px · 3 of 70 slices shown]
[im 24/70  brain]
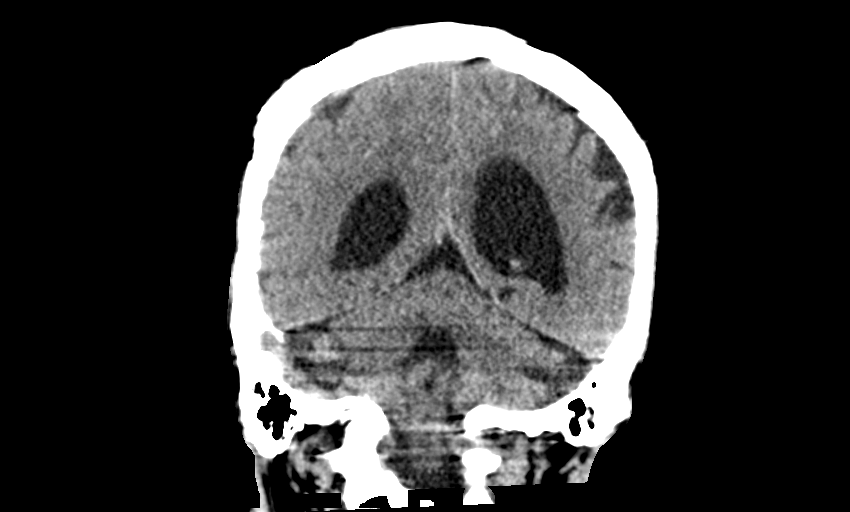
[im 31/70  brain]
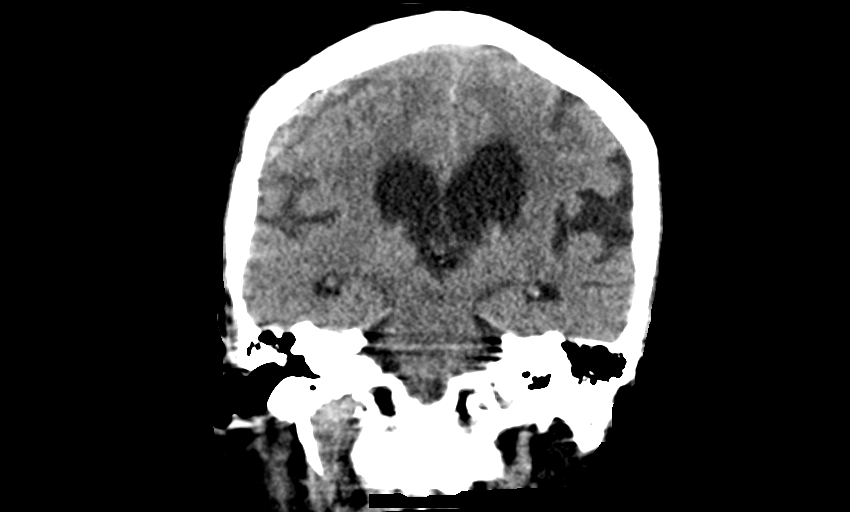
[im 39/70  brain]
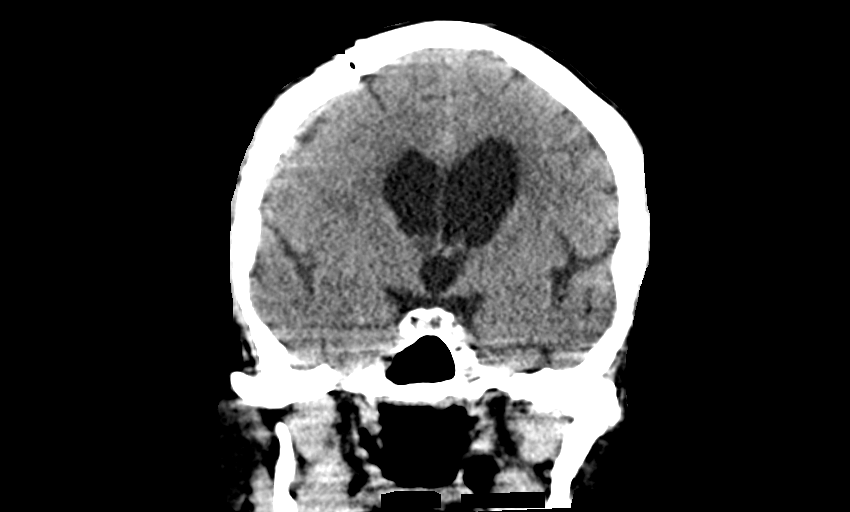

[Series 7: head 3.0 mpr ax · axial · 0.34mm/px · z∈[-117,+29]mm · 9 of 57 slices shown, 12 images]
[im 4/57  brain]
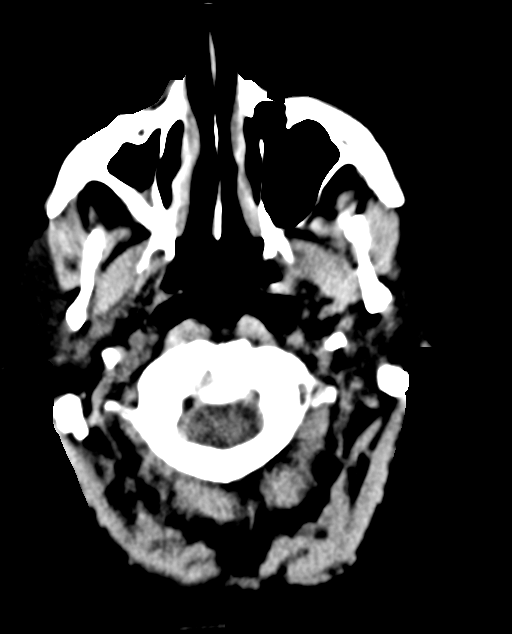
[im 4/57  bone]
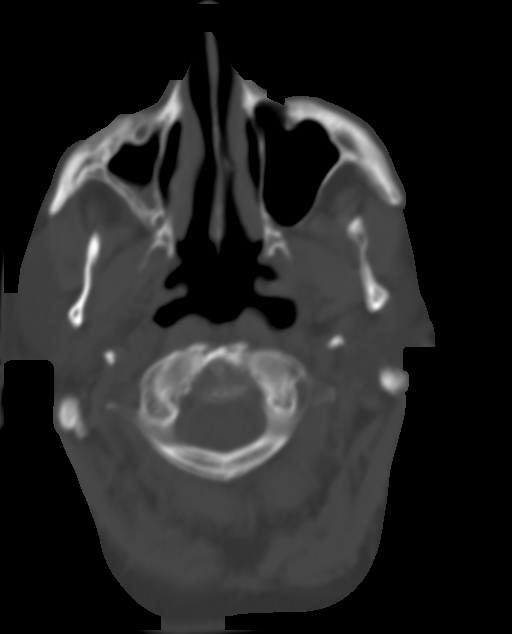
[im 10/57  brain]
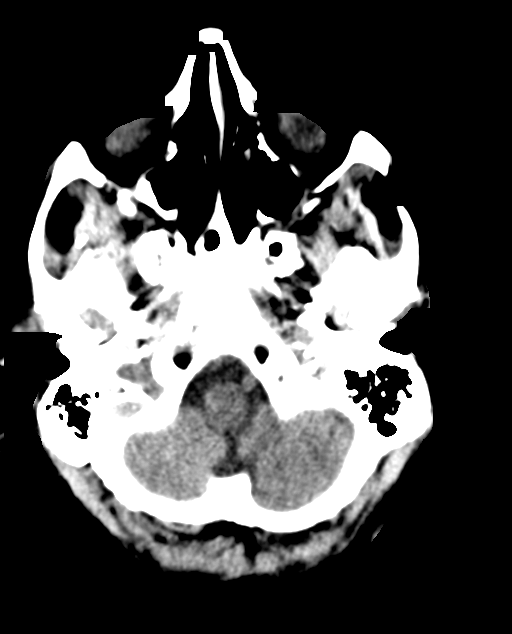
[im 17/57  brain]
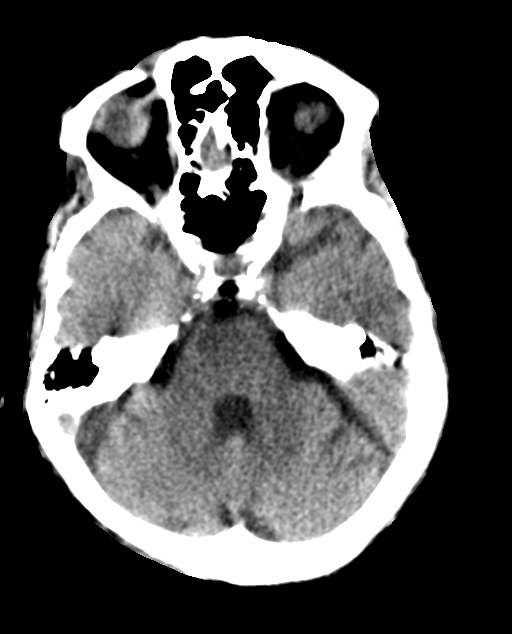
[im 24/57  brain]
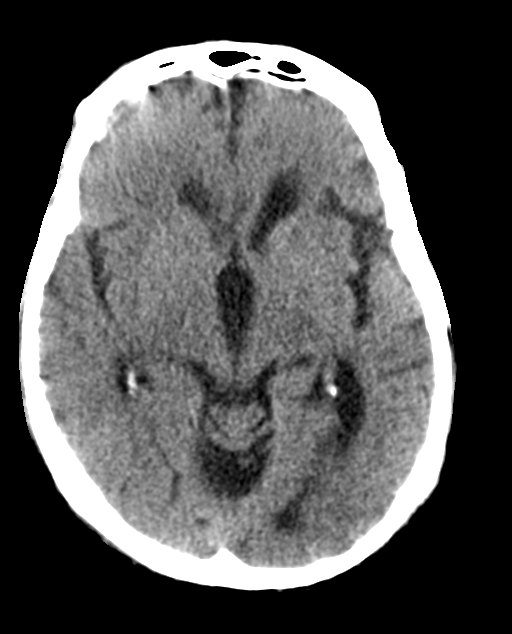
[im 30/57  brain]
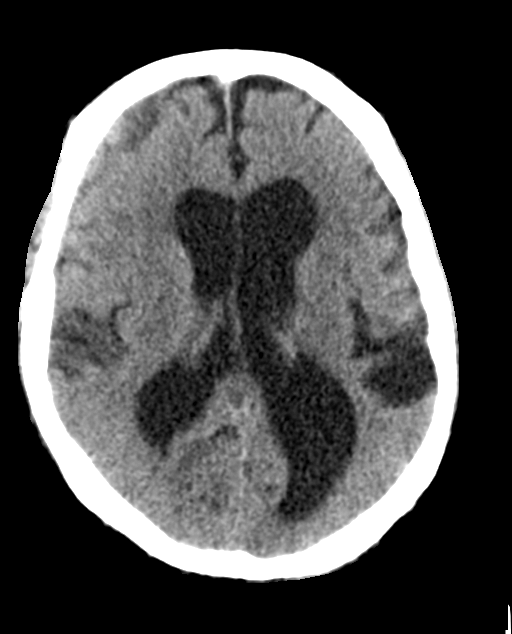
[im 30/57  bone]
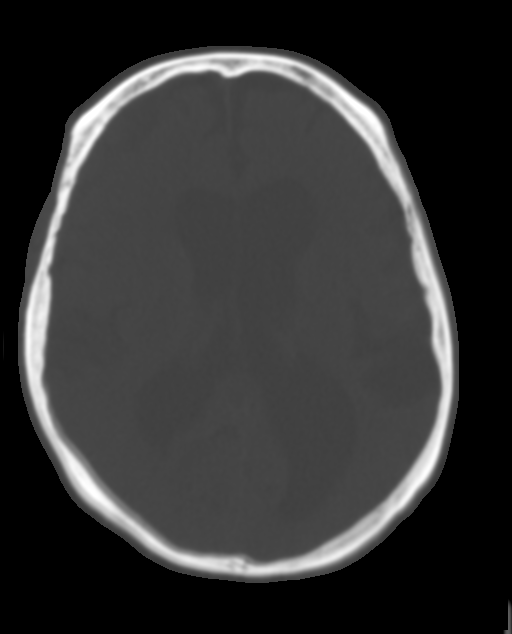
[im 33/57  brain]
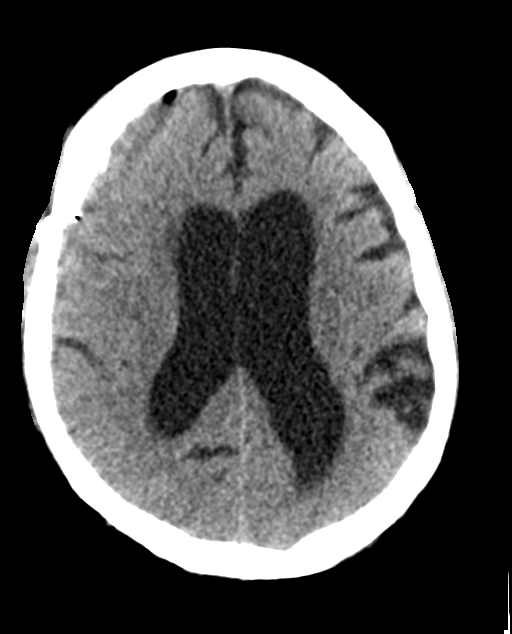
[im 40/57  brain]
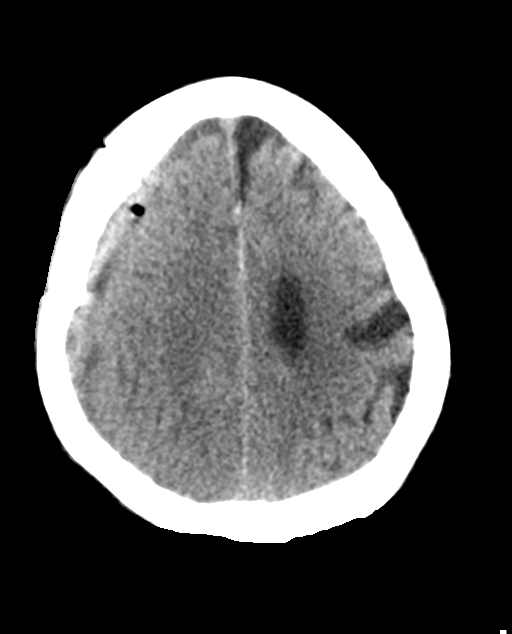
[im 47/57  brain]
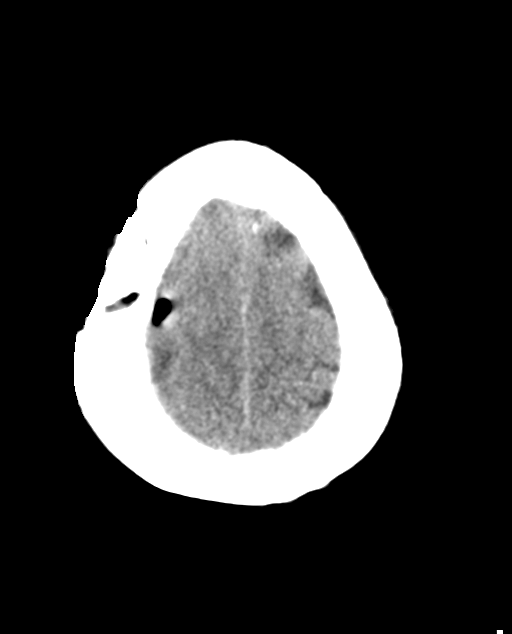
[im 53/57  brain]
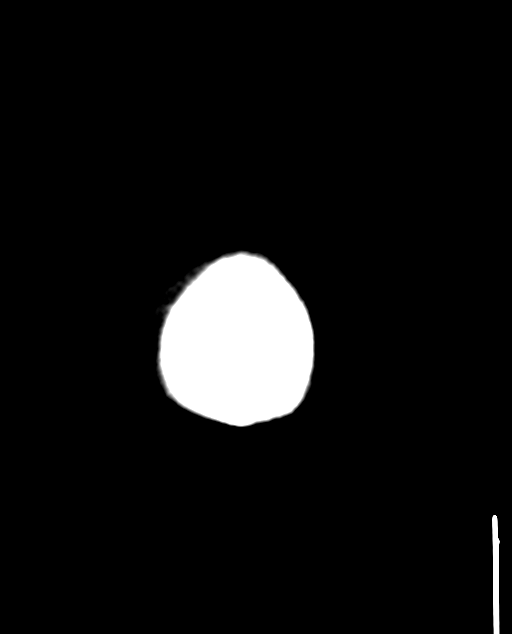
[im 53/57  bone]
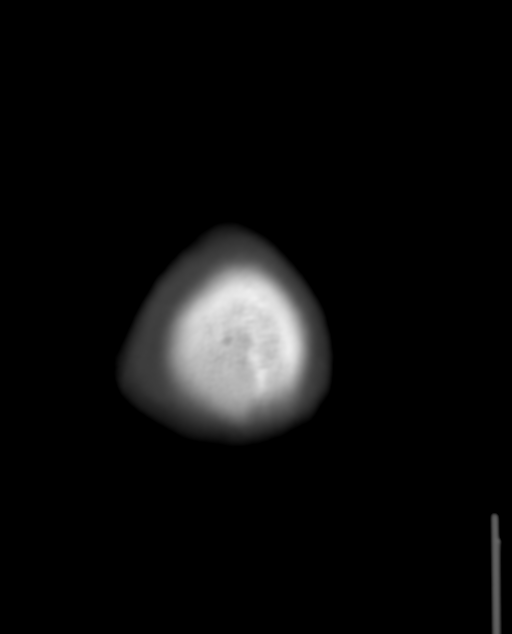

[12 of 47 positions shown; findings below may reference images not displayed]

FINDINGS: Brain: Right subdural drain has been removed since the prior CT.
Mixed density subdural fluid collection on the left shows
progression since the prior CT of 04/21/2019. Low-density fluid
anteriorly in the right frontal lobe measures 10 mm in thickness
which is similar in size to the prior study. Right frontal parietal
convexity subdural fluid is mixed density and measures approximately
6 mm unchanged from the prior study. Decreased subdural gas since
the prior study.

Moderate ventricular enlargement unchanged.

No acute infarct.  No new hemorrhage.  No midline shift.

Vascular: Negative for hyperdense vessel

Skull: Right frontal craniotomy.  Craniotomy flap in good position.

Sinuses/Orbits: Mucoperiosteal thickening right maxillary sinus.
Air-fluid level right maxillary sinus.

Other: None
IMPRESSION: Postop right craniotomy for subdural hematoma drainage. Residual
subdural fluid on the right shows progressive decrease in density
and is similar in size to the prior CT

Moderate ventricular enlargement stable.

No new hemorrhage.

## 2020-07-08 DIAGNOSIS — N401 Enlarged prostate with lower urinary tract symptoms: Secondary | ICD-10-CM | POA: Diagnosis not present

## 2020-07-08 DIAGNOSIS — R3914 Feeling of incomplete bladder emptying: Secondary | ICD-10-CM | POA: Diagnosis not present

## 2020-07-22 ENCOUNTER — Ambulatory Visit: Payer: BLUE CROSS/BLUE SHIELD | Admitting: Physical Therapy

## 2020-07-29 ENCOUNTER — Other Ambulatory Visit: Payer: Self-pay

## 2020-07-29 ENCOUNTER — Ambulatory Visit: Payer: Medicare Other | Attending: Internal Medicine | Admitting: Physical Therapy

## 2020-07-29 DIAGNOSIS — R2681 Unsteadiness on feet: Secondary | ICD-10-CM | POA: Insufficient documentation

## 2020-07-29 DIAGNOSIS — M6281 Muscle weakness (generalized): Secondary | ICD-10-CM | POA: Insufficient documentation

## 2020-07-29 DIAGNOSIS — R2689 Other abnormalities of gait and mobility: Secondary | ICD-10-CM | POA: Diagnosis not present

## 2020-07-29 DIAGNOSIS — R293 Abnormal posture: Secondary | ICD-10-CM | POA: Diagnosis not present

## 2020-07-29 NOTE — Therapy (Signed)
[facility] [address] [place], [place], [address] Phone: [phone]   Fax:  [phone]  Physical Therapy Evaluation  Patient Details  Name: Benjamin Hahn MRN: [id number] Date of Birth: 1932-10-21 Referring Provider (PT): Berle Mull, MD   Encounter Date: 07/29/2020   PT End of Session - 07/29/20 1548    Visit Number 1    Number of Visits 18    Date for PT Re-Evaluation 10/27/20    Authorization Type Medicare    Authorization Time Period Will need KX starting after eval (due to therapy already this year)    Progress Note Due on Visit 10    PT Start Time 0846    PT Stop Time 0931    PT Time Calculation (min) 45 min    Equipment Utilized During Treatment Gait belt    Activity Tolerance Patient tolerated treatment well    Behavior During Therapy [facility] for tasks assessed/performed           Past Medical History:  Diagnosis Date  . Anxiety   . Arthritis    might be in back, no problems  . BPH (benign prostatic hyperplasia)   . Depression 1990s  . GERD (gastroesophageal reflux disease)    occasional  . Hypercholesteremia    controled  . Hypothyroid   . MI (myocardial infarction) ([place])   . Shingles May 2014   "Right face, still has some"  . Temporal arteritis ([place])   . Transient blindness of both eyes     Past Surgical History:  Procedure Laterality Date  . ARTERY BIOPSY Right 06/20/2013   Procedure: BIOPSY TEMPORAL ARTERY RIGHT;  Surgeon: Earnstine Regal, MD;  Location: [facility];  Service: General;  Laterality: Right;  . CARDIAC CATHETERIZATION N/A 09/02/2015   Procedure: Left Heart Cath and Coronary Angiography;  Surgeon: Charolette Forward, MD;  Location: [facility];  Service: Cardiovascular;  Laterality: N/A;  . CARDIAC CATHETERIZATION N/A 09/02/2015   Procedure: Coronary Stent Intervention;  Surgeon: Charolette Forward, MD;  Location: [facility];  Service: Cardiovascular;  Laterality: N/A;  1.   mid RCA      (3.0/28mm Xience) 2.  Mid LAD      (3.0/23mm Xience)  . CRANIOTOMY N/A 04/19/2019   Procedure: CRANIOTOMY HEMATOMA EVACUATION SUBDURAL;  Surgeon: Jovita Gamma, MD;  Location: [facility];  Service: Neurosurgery;  Laterality: N/A;  CRANIOTOMY HEMATOMA EVACUATION SUBDURAL  . None      There were no vitals filed for this visit.    Subjective Assessment - 07/29/20 0852    Subjective Have had no falls in the past 6 months.  One of my doctors wanted to have me come back for balance.  Some mornings, the balance is pretty bad.  Using the rollator in the house and have one at the office.    Patient is accompained by: Family member   wife   Patient Stated Goals Patient:  Would like to graduate to cane if I could.  Wife:  want to make sure he's balanced and not falling; the walker is okay with me.    Currently in Pain? No/denies              [facility] PT Assessment - 07/29/20 0854      Assessment   Medical Diagnosis Balance    Referring Provider (PT) Berle Mull, MD    Onset Date/Surgical Date 07/05/20   MD order   Hand Dominance Right  Precautions   Precautions Fall      Balance Screen   Has the patient fallen in the past 6 months No    Has the patient had a decrease in activity level because of a fear of falling?  No    Is the patient reluctant to leave their home because of a fear of falling?  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    Leisure Enjoys playing golf, but has not played since Covid; goes  to [employer] for shows      Observation/Other Assessments   Focus on Therapeutic Outcomes (FOTO)  NA      Posture/Postural Control   Posture/Postural Control Postural limitations    Postural Limitations Forward head;Rounded Shoulders      ROM / Strength   AROM / PROM / Strength Strength      Strength   Overall Strength Deficits    Strength Assessment Site Hip;Knee;Ankle    Right/Left Hip Right;Left    Right Hip Flexion 3+/5    Left Hip Flexion 4/5    Right/Left Knee Right;Left    Right Knee Flexion 3+/5    Right Knee Extension 3+/5    Left Knee Flexion 4/5    Left Knee Extension 4/5    Right/Left Ankle Right;Left    Right Ankle Dorsiflexion 4/5    Left Ankle Dorsiflexion 4/5      Transfers   Transfers Sit to Stand;Stand to Sit    Sit to Stand 5: Supervision;With upper extremity assist;From chair/3-in-1    Five time sit to stand comments  19.13    Stand to Sit 5: Supervision;With upper extremity assist;To chair/3-in-1    Transfer Cueing Cues to fully turn to sit, as he lets go of rollator to turn early to sit    Comments Unable to stand up without UE support      Ambulation/Gait   Ambulation/Gait Yes    Ambulation/Gait Assistance 4: Min guard    Ambulation Distance (Feet) 120 Feet    Assistive device Rollator    Gait Pattern Step-through pattern;Step-to pattern;Decreased step length - right;Decreased hip/knee flexion - right;Decreased dorsiflexion - right;Shuffle    Ambulation Surface Level;Indoor    Gait velocity 16.22 sec = 2 ft/sec   1.76 ft/sec with conversation tasks     Standardized Balance Assessment   Standardized Balance Assessment Timed Up and Go Test;Berg Balance Test      Berg Balance Test   Sit to Stand Able to stand  independently using hands    Standing Unsupported Able to stand 2 minutes with supervision    Sitting with Back Unsupported but Feet Supported on Floor or Stool Able to sit safely and securely 2 minutes    Stand to Sit Sits  independently, has uncontrolled descent    Transfers Able to transfer with verbal cueing and /or supervision    Standing Unsupported with Eyes Closed Able to stand 10  seconds with supervision    Standing Unsupported with Feet Together Able to place feet together independently but unable to hold for 30 seconds   12.29 sec veers to R   From Standing, Reach Forward with Outstretched Arm Reaches forward but needs supervision    From Standing Position, Pick up Object from Floor Able to pick up shoe, needs supervision    From Standing Position, Turn to Look Behind Over each Shoulder Needs supervision when turning    Turn 360 Degrees Needs assistance while turning    Standing Unsupported, Alternately Place Feet on Step/Stool Needs assistance to keep from falling or unable to try    Standing Unsupported, One Foot in [place] help to step but can hold 15 seconds    Standing on One Leg Unable to try or needs assist to prevent fall    Total Score 24    Berg comment: Scores <45/56 indicate increased fall risk.      Timed Up and Go Test   Normal TUG (seconds) 17.12    TUG Comments Scores >13.5 sec indicate increased fall risk.                      Objective measurements completed on examination: See above findings.               PT Education - 07/29/20 1547    Education Details PT eval results, educated that pt is at fall risk, needs to continue to use rollator; PT POC    Person(s) Educated Patient;Spouse    Methods Explanation    Comprehension Verbalized understanding            PT Short Term Goals - 07/29/20 1557      PT SHORT TERM GOAL #1   Title Pt will perform HEP with family supervision, for improved strength, balance, and gait.  TARGET 08/30/2020    Time 5    Period Weeks    Status New      PT SHORT TERM GOAL #2   Title Pt will improve TUG score to less than or equal to 15 seconds for decreased fall risk.    Baseline 17.12    Time 5    Period Weeks     Status New      PT SHORT TERM GOAL #3   Title Pt will improve Berg Balance score to at least 29/56 for decreased fall risk.    Baseline 24/56    Time 5    Period Weeks    Status New      PT SHORT TERM GOAL #4   Title Pt will improve 5x sit<>stand to less than or equal to 15 seconds for improved functional lower extremity strength.    Baseline 19.13 sec at eval    Time 5    Period Weeks    Status New      PT SHORT TERM GOAL #5   Title Pt will verbalize understanding of fall prevention in home envrionment.    Time 5    Period Weeks    Status New             PT Long Term Goals - 07/29/20 1600      PT LONG TERM GOAL #1   Title Pt will perform progression of HEP for improved balance, strength, and gait.  For all LTGs:  TARGET 09/27/2020    Time 9    Period Weeks    Status New  PT LONG TERM GOAL #2   Title Pt will improve TUG score to less than or equal to 13.5 seconds for decreased fall risk.    Baseline 17.12 at eval    Time 9    Period Weeks    Status New      PT LONG TERM GOAL #3   Title Pt will improve Berg score to at least 39/56 for decreased fall risk.    Baseline 24/56 eval    Time 9    Period Weeks    Status New      PT LONG TERM GOAL #4   Title Pt will improve gait velocity to at least 2.62 ft/sec for improved gait efficiency and safety.    Baseline 2.2 ft/sec    Time 9    Period Weeks    Status New      PT LONG TERM GOAL #5   Title Pt will verbalize/demo understanding of continued community fitness plan to maximize functional gains made in PT.    Time 9    Period Weeks    Status New                  Plan - 07/29/20 1550    Clinical Impression Statement Pt is an 86 year old male who presents to OPPT with dx of balance difficulty, with hx of falls and a SDH (over 1 year ago, for which pt was seen at this clinic previously).  He is known to this clinic, and was discharged from previous bout of therapy in May 2021.  He presents today with  no new c/o falls; he demo increased fall risk per Merrilee Jansky and TUG scores, decreased functional lower extremity strength (unable to get out of chair without UE support), abnormal posture, decreased independence with gait and mobility.  His objective measures for Berg, TUG and 5x sit<>stand have declined since d/c in May.  He is not currently doing previous HEP or going to gym.  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  He will benefit from skilled PT to address the above stated deficits for decreased fall risk and improved overall functional mobility.    Personal Factors and Comorbidities Comorbidity 3+;Time since onset of injury/illness/exacerbation    Comorbidities See PMH    Examination-Activity Limitations Locomotion Level;Transfers;Stand    Examination-Participation Restrictions Community Activity;Other   Community exercise; going out to eat   Stability/Clinical Decision Making Evolving/Moderate complexity    Clinical Decision Making Moderate    Rehab Potential Good    PT Frequency 2x / week    PT Duration Other (comment)   9 weeks, including eval week   PT Treatment/Interventions ADLs/Self Care Home Management;DME Instruction;Gait training;Stair training;Functional mobility training;Therapeutic activities;Therapeutic exercise;Balance training;Neuromuscular re-education;Patient/family education    PT Next Visit Plan Initiate HEP for home to include functional strengthening and balance (SLS, tandem, vestibular system use); use of machines in clinic (seated stepper, leg press) that pt can be using in the gym he has access to    Consulted and Agree with Plan of Care Patient;Family member/caregiver    Family Member Consulted wife           Patient will benefit from skilled therapeutic intervention in order to improve the following deficits and impairments:  Abnormal gait, Difficulty walking, Decreased safety awareness, Decreased balance, Decreased mobility, Decreased strength, Postural  dysfunction  Visit Diagnosis: Other abnormalities of gait and mobility  Unsteadiness on feet  Muscle weakness (generalized)  Abnormal posture  Problem List Patient Active Problem List   Diagnosis Date Noted  . Routine general medical examination at a health care facility 12/09/2019  . Hypotension 12/07/2019  . Psychophysiological insomnia 12/07/2019  . Deficiency anemia 06/01/2019  . Vitamin D deficiency 06/01/2019  . Chronic idiopathic constipation 06/01/2019  . Hyperglycemia 04/15/2019  . Mild intermittent asthma with acute exacerbation 02/08/2018  . Hypothyroidism 03/14/2013  . Hyperlipidemia LDL goal <70 03/14/2013  . BPH (benign prostatic hypertrophy) 03/14/2013  . Hypogonadism, male 03/14/2013  . Erectile dysfunction 03/14/2013    Benjamin Butt. 07/29/2020, 4:04 PM  Benjamin Butt., PT   [facility] [address] [place], [place], [address] Phone: [phone]   Fax:  [phone]  Name: Benjamin Hahn MRN: [id number] Date of Birth: 04/13/1933

## 2020-07-31 ENCOUNTER — Encounter: Payer: Self-pay | Admitting: Physical Therapy

## 2020-07-31 ENCOUNTER — Other Ambulatory Visit: Payer: Self-pay

## 2020-07-31 ENCOUNTER — Ambulatory Visit: Payer: Medicare Other | Admitting: Physical Therapy

## 2020-07-31 DIAGNOSIS — R2689 Other abnormalities of gait and mobility: Secondary | ICD-10-CM

## 2020-07-31 DIAGNOSIS — R293 Abnormal posture: Secondary | ICD-10-CM

## 2020-07-31 DIAGNOSIS — R2681 Unsteadiness on feet: Secondary | ICD-10-CM

## 2020-07-31 DIAGNOSIS — M6281 Muscle weakness (generalized): Secondary | ICD-10-CM | POA: Diagnosis not present

## 2020-07-31 NOTE — Patient Instructions (Signed)
Access Code: [email or web address] URL: [email or web address] Date: 07/31/2020 Prepared by: Nita Sells  Exercises Standing March with Counter Support - 1 x daily - 5 x weekly - 3 sets - 10 reps Standing Hip Abduction with Counter Support - 1 x daily - 5 x weekly - 3 sets - 10 reps Heel rises with counter support - 1 x daily - 5 x weekly - 3 sets - 10 reps Recumbent Bike - 1 x daily - 3 x weekly - 5-7 minutes time

## 2020-07-31 NOTE — Therapy (Signed)
[facility] [address] [place], [place], [address] Phone: [phone]   Fax:  [phone]  Physical Therapy Treatment  Patient Details  Name: Benjamin Hahn MRN: [id number] Date of Birth: [birth year] Referring Provider (PT): Berle Mull, MD   Encounter Date: 07/31/2020   PT End of Session - 07/31/20 1043    Visit Number 2    Number of Visits 18    Date for PT Re-Evaluation 10/27/20    Authorization Type Medicare    Authorization Time Period Will need KX starting after eval (due to therapy already this year)    Progress Note Due on Visit 10    PT Start Time 0847    PT Stop Time 0933    PT Time Calculation (min) 46 min    Equipment Utilized During Treatment Gait belt    Activity Tolerance Patient tolerated treatment well    Behavior During Therapy [facility] for tasks assessed/performed           Past Medical History:  Diagnosis Date  . Anxiety   . Arthritis    might be in back, no problems  . BPH (benign prostatic hyperplasia)   . Depression 1990s  . GERD (gastroesophageal reflux disease)    occasional  . Hypercholesteremia    controled  . Hypothyroid   . MI (myocardial infarction) ([place])   . Shingles May 2014   "Right face, still has some"  . Temporal arteritis ([place])   . Transient blindness of both eyes     Past Surgical History:  Procedure Laterality Date  . ARTERY BIOPSY Right 06/20/2013   Procedure: BIOPSY TEMPORAL ARTERY RIGHT;  Surgeon: Earnstine Regal, MD;  Location: [facility];  Service: General;  Laterality: Right;  . CARDIAC CATHETERIZATION N/A 09/02/2015   Procedure: Left Heart Cath and Coronary Angiography;  Surgeon: Charolette Forward, MD;  Location: [facility];  Service: Cardiovascular;  Laterality: N/A;  . CARDIAC CATHETERIZATION N/A 09/02/2015   Procedure: Coronary Stent Intervention;  Surgeon: Charolette Forward, MD;  Location: [facility];  Service: Cardiovascular;  Laterality: N/A;  1.   mid RCA      (3.0/28mm Xience) 2.  Mid LAD      (3.0/23mm Xience)  . CRANIOTOMY N/A 04/19/2019   Procedure: CRANIOTOMY HEMATOMA EVACUATION SUBDURAL;  Surgeon: Jovita Gamma, MD;  Location: [facility];  Service: Neurosurgery;  Laterality: N/A;  CRANIOTOMY HEMATOMA EVACUATION SUBDURAL  . None      There were no vitals filed for this visit.   Subjective Assessment - 07/31/20 0849    Subjective "I need some exercises I can work on at home."    Patient is accompained by: Family member   wife in lobby   Patient Stated Goals Patient:  Would like to graduate to cane if I could.  Wife:  want to make sure he's balanced and not falling; the walker is okay with me.                             [place] Adult PT Treatment/Exercise - 07/31/20 0001      Transfers   Transfers Sit to Stand;Stand to Sit    Sit to Stand 5: Supervision;With upper extremity assist;From chair/3-in-1    Stand to Sit 5: Supervision;With upper extremity assist;To chair/3-in-1    Transfer Cueing cues to stay close to rollator and turn completely before sitting      Ambulation/Gait   Ambulation/Gait  Yes    Ambulation/Gait Assistance 4: Min guard    Ambulation Distance (Feet) 120 Feet   x 3 and 150' x 1   Assistive device Rollator    Gait Pattern Step-through pattern;Step-to pattern;Decreased step length - right;Decreased hip/knee flexion - right;Decreased dorsiflexion - right;Shuffle    Ambulation Surface Level;Indoor    Gait Comments cues for increased step length bil to decrease shuffling.  Pt with improved gait pattern after hamstring stretch and with increased mobility.      Posture/Postural Control   Posture/Postural Control Postural limitations    Postural Limitations Forward head;Rounded Shoulders      Exercises   Exercises Knee/Hip      Knee/Hip Exercises: Aerobic   Other Aerobic Scifit level 2.5 all 4 extremities x 5 minutes.  Initially at 60 rpm progressing to 70.  Pt reports intensity level  9/10.      Knee/Hip Exercises: Machines for Strengthening   Total Gym Leg Press bil LE 50# x 10, 60# x 10 and 65# x 10.  Did hold in extension with each change in # for hamstring stretch.      Knee/Hip Exercises: Standing   Heel Raises Both;3 sets;10 reps;2 seconds    Hip Flexion AROM;Stengthening;Both;3 sets;10 reps;Knee bent;Other (comment)   with 2# weight   Hip Abduction AROM;Stengthening;Both;3 sets;10 reps;Knee straight;Other (comment)   2# weight                 PT Education - 07/31/20 1043    Education Details HEP, continuing to use rollator for safety    Person(s) Educated Patient    Methods Explanation;Demonstration;Verbal cues;Handout    Comprehension Verbalized understanding;Need further instruction            PT Short Term Goals - 07/29/20 1557      PT SHORT TERM GOAL #1   Title Pt will perform HEP with family supervision, for improved strength, balance, and gait.  TARGET 08/30/2020    Time 5    Period Weeks    Status New      PT SHORT TERM GOAL #2   Title Pt will improve TUG score to less than or equal to 15 seconds for decreased fall risk.    Baseline 17.12    Time 5    Period Weeks    Status New      PT SHORT TERM GOAL #3   Title Pt will improve Berg Balance score to at least 29/56 for decreased fall risk.    Baseline 24/56    Time 5    Period Weeks    Status New      PT SHORT TERM GOAL #4   Title Pt will improve 5x sit<>stand to less than or equal to 15 seconds for improved functional lower extremity strength.    Baseline 19.13 sec at eval    Time 5    Period Weeks    Status New      PT SHORT TERM GOAL #5   Title Pt will verbalize understanding of fall prevention in home envrionment.    Time 5    Period Weeks    Status New             PT Long Term Goals - 07/29/20 1600      PT LONG TERM GOAL #1   Title Pt will perform progression of HEP for improved balance, strength, and gait.  For all LTGs:  TARGET 09/27/2020    Time 9     Period  Weeks    Status New      PT LONG TERM GOAL #2   Title Pt will improve TUG score to less than or equal to 13.5 seconds for decreased fall risk.    Baseline 17.12 at eval    Time 9    Period Weeks    Status New      PT LONG TERM GOAL #3   Title Pt will improve Berg score to at least 39/56 for decreased fall risk.    Baseline 24/56 eval    Time 9    Period Weeks    Status New      PT LONG TERM GOAL #4   Title Pt will improve gait velocity to at least 2.62 ft/sec for improved gait efficiency and safety.    Baseline 2.2 ft/sec    Time 9    Period Weeks    Status New      PT LONG TERM GOAL #5   Title Pt will verbalize/demo understanding of continued community fitness plan to maximize functional gains made in PT.    Time 9    Period Weeks    Status New                 Plan - 07/31/20 1044    Clinical Impression Statement Session focused on developing HEP that pt can perform at home and in gym.  Pt needs cues for safety and technique.  Gait improved with bil hamstring stretching.  Continues to need reinforcement to increase activity at home.  Cont per poc.    Personal Factors and Comorbidities Comorbidity 3+;Time since onset of injury/illness/exacerbation    Comorbidities See PMH    Examination-Activity Limitations Locomotion Level;Transfers;Stand    Examination-Participation Restrictions Community Activity;Other   Community exercise; going out to eat   Stability/Clinical Decision Making Evolving/Moderate complexity    Rehab Potential Good    PT Frequency 2x / week    PT Duration Other (comment)   9 weeks, including eval week   PT Treatment/Interventions ADLs/Self Care Home Management;DME Instruction;Gait training;Stair training;Functional mobility training;Therapeutic activities;Therapeutic exercise;Balance training;Neuromuscular re-education;Patient/family education    PT Next Visit Plan review HEP provided last session.  continue use of machines in clinic (seated  stepper, leg press) that pt can be using in the gym he has access to    Consulted and Agree with Plan of Care Patient;Family member/caregiver    Family Member Consulted wife           Patient will benefit from skilled therapeutic intervention in order to improve the following deficits and impairments:  Abnormal gait, Difficulty walking, Decreased safety awareness, Decreased balance, Decreased mobility, Decreased strength, Postural dysfunction  Visit Diagnosis: Other abnormalities of gait and mobility  Unsteadiness on feet  Muscle weakness (generalized)  Abnormal posture     Problem List Patient Active Problem List   Diagnosis Date Noted  . Routine general medical examination at a health care facility 12/09/2019  . Hypotension 12/07/2019  . Psychophysiological insomnia 12/07/2019  . Deficiency anemia 06/01/2019  . Vitamin D deficiency 06/01/2019  . Chronic idiopathic constipation 06/01/2019  . Hyperglycemia 04/15/2019  . Mild intermittent asthma with acute exacerbation 02/08/2018  . Hypothyroidism 03/14/2013  . Hyperlipidemia LDL goal <70 03/14/2013  . BPH (benign prostatic hypertrophy) 03/14/2013  . Hypogonadism, male 03/14/2013  . Erectile dysfunction 03/14/2013    Narda Bonds, PTA [facility] 07/31/20 10:49 AM Phone: [phone] Fax: [facility] [phone]  [place], [place], [address] Phone: [phone]   Fax:  [phone]  Name: Benjamin Hahn MRN: [id number] Date of Birth: 1932/12/19

## 2020-08-07 ENCOUNTER — Ambulatory Visit: Payer: Medicare Other | Admitting: Physical Therapy

## 2020-08-09 ENCOUNTER — Ambulatory Visit: Payer: Medicare Other | Admitting: Physical Therapy

## 2020-08-09 ENCOUNTER — Other Ambulatory Visit: Payer: Self-pay

## 2020-08-09 ENCOUNTER — Encounter: Payer: Self-pay | Admitting: Physical Therapy

## 2020-08-09 DIAGNOSIS — M6281 Muscle weakness (generalized): Secondary | ICD-10-CM | POA: Diagnosis not present

## 2020-08-09 DIAGNOSIS — R2689 Other abnormalities of gait and mobility: Secondary | ICD-10-CM

## 2020-08-09 DIAGNOSIS — R293 Abnormal posture: Secondary | ICD-10-CM

## 2020-08-09 DIAGNOSIS — R2681 Unsteadiness on feet: Secondary | ICD-10-CM

## 2020-08-09 NOTE — Patient Instructions (Signed)
Access Code: [email or web address] URL: [email or web address] Date: 08/09/2020 Prepared by: Willow Ora  Exercises Standing March with Counter Support - 1 x daily - 5 x weekly - 1 sets - 10 reps - 3 hold Standing Hip Abduction with Counter Support - 1 x daily - 5 x weekly - 1 sets - 10 reps Heel rises with counter support - 1 x daily - 5 x weekly - 3 sets - 10 reps Recumbent Bike - 1 x daily - 3 x weekly - 5-7 minutes time Standing Tandem Balance with Counter Support - 1 x daily - 5 x weekly - 1 sets - 3 reps - 30 hold Standing Single Leg Stance with Counter Support - 1 x daily - 5 x weekly - 1 sets - 3 reps - 10 hold

## 2020-08-09 NOTE — Therapy (Signed)
[facility] [address] [place], [place], [address] Phone: [phone]   Fax:  [phone]  Physical Therapy Treatment  Patient Details  Name: Benjamin Hahn MRN: [id number] Date of Birth: 1932/11/09 Referring Provider (PT): Berle Mull, MD   Encounter Date: 08/09/2020   PT End of Session - 08/09/20 0851    Visit Number 3    Number of Visits 18    Date for PT Re-Evaluation 10/27/20    Authorization Type Medicare    Authorization Time Period Will need KX starting after eval (due to therapy already this year)    Progress Note Due on Visit 10    PT Start Time 0846    PT Stop Time 0925    PT Time Calculation (min) 39 min    Equipment Utilized During Treatment Gait belt    Activity Tolerance Patient tolerated treatment well    Behavior During Therapy [facility] for tasks assessed/performed           Past Medical History:  Diagnosis Date  . Anxiety   . Arthritis    might be in back, no problems  . BPH (benign prostatic hyperplasia)   . Depression 1990s  . GERD (gastroesophageal reflux disease)    occasional  . Hypercholesteremia    controled  . Hypothyroid   . MI (myocardial infarction) ([place])   . Shingles May 2014   "Right face, still has some"  . Temporal arteritis ([place])   . Transient blindness of both eyes     Past Surgical History:  Procedure Laterality Date  . ARTERY BIOPSY Right 06/20/2013   Procedure: BIOPSY TEMPORAL ARTERY RIGHT;  Surgeon: Earnstine Regal, MD;  [facility];  Service: General;  Laterality: Right;  . CARDIAC CATHETERIZATION N/A 09/02/2015   Procedure: Left Heart Cath and Coronary Angiography;  Surgeon: Charolette Forward, MD;  Location: [place];  Service: Cardiovascular;  Laterality: N/A;  . CARDIAC CATHETERIZATION N/A 09/02/2015   Procedure: Coronary Stent Intervention;  Surgeon: Charolette Forward, MD;  Location: [facility];  Service: Cardiovascular;  Laterality: N/A;  1.   mid RCA      (3.0/28mm Xience) 2.  Mid LAD      (3.0/23mm Xience)  . CRANIOTOMY N/A 04/19/2019   Procedure: CRANIOTOMY HEMATOMA EVACUATION SUBDURAL;  Surgeon: Jovita Gamma, MD;  Location: [facility];  Service: Neurosurgery;  Laterality: N/A;  CRANIOTOMY HEMATOMA EVACUATION SUBDURAL  . None      There were no vitals filed for this visit.   Subjective Assessment - 08/09/20 0851    Subjective No new complaints. No falls or pain to report. States the HEP is "easy".    Patient is accompained by: Family member   spouse in lobby   Patient Stated Goals Patient:  Would like to graduate to cane if I could.  Wife:  want to make sure he's balanced and not falling; the walker is okay with me.    Currently in Pain? No/denies                [facility] Adult PT Treatment/Exercise - 08/09/20 0909      Transfers   Transfers Sit to Stand;Stand to Sit    Sit to Stand 5: Supervision;With upper extremity assist;From chair/3-in-1    Stand to Sit 5: Supervision;With upper extremity assist;To chair/3-in-1      Ambulation/Gait   Ambulation/Gait Yes    Ambulation/Gait Assistance 4: Min guard    Ambulation/Gait Assistance Details cues for posture  and to stay with rollator needed at times with    Ambulation Distance (Feet) 100 Feet   x2, plus around gym`   Assistive device Rollator    Gait Pattern Step-through pattern;Step-to pattern;Decreased step length - right;Decreased hip/knee flexion - right;Decreased dorsiflexion - right;Shuffle    Ambulation Surface Level;Indoor      Exercises   Exercises Other Exercises    Other Exercises  reviewed HEP from last session. Pt needed cues on all ex's as he did not recall doing them "this is new" was stated for each one. Added new ex's for balance with cues, min guard assist. Handout given for all ex's today.      Knee/Hip Exercises: Aerobic   Other Aerobic Scifit UE/LEs on level 2.5 for 4 minutes, then level 3.0 for 4 minutes with RPM >/= 50 for strengthening and  activity tolerance.          Issued to HEP today:   Access Code: [email or web address] URL: [email or web address] Date: 08/09/2020 Prepared by: Willow Ora  Exercises Standing March with Counter Support - 1 x daily - 5 x weekly - 1 sets - 10 reps - 3 hold Standing Hip Abduction with Counter Support - 1 x daily - 5 x weekly - 1 sets - 10 reps Heel rises with counter support - 1 x daily - 5 x weekly - 3 sets - 10 reps Recumbent Bike - 1 x daily - 3 x weekly - 5-7 minutes time Standing Tandem Balance with Counter Support - 1 x daily - 5 x weekly - 1 sets - 3 reps - 30 hold Standing Single Leg Stance with Counter Support - 1 x daily - 5 x weekly - 1 sets - 3 reps - 10 hold       PT Education - 08/09/20 1429    Education Details HEP for strengthening and balance    Person(s) Educated Patient    Methods Explanation;Demonstration;Verbal cues;Tactile cues;Handout    Comprehension Verbalized understanding;Returned demonstration;Verbal cues required;Tactile cues required;Need further instruction            PT Short Term Goals - 07/29/20 1557      PT SHORT TERM GOAL #1   Title Pt will perform HEP with family supervision, for improved strength, balance, and gait.  TARGET 08/30/2020    Time 5    Period Weeks    Status New      PT SHORT TERM GOAL #2   Title Pt will improve TUG score to less than or equal to 15 seconds for decreased fall risk.    Baseline 17.12    Time 5    Period Weeks    Status New      PT SHORT TERM GOAL #3   Title Pt will improve Berg Balance score to at least 29/56 for decreased fall risk.    Baseline 24/56    Time 5    Period Weeks    Status New      PT SHORT TERM GOAL #4   Title Pt will improve 5x sit<>stand to less than or equal to 15 seconds for improved functional lower extremity strength.    Baseline 19.13 sec at eval    Time 5    Period Weeks    Status New      PT SHORT TERM GOAL #5   Title Pt will verbalize understanding of fall prevention  in home envrionment.    Time 5    Period Weeks    Status New  PT Long Term Goals - 07/29/20 1600      PT LONG TERM GOAL #1   Title Pt will perform progression of HEP for improved balance, strength, and gait.  For all LTGs:  TARGET 09/27/2020    Time 9    Period Weeks    Status New      PT LONG TERM GOAL #2   Title Pt will improve TUG score to less than or equal to 13.5 seconds for decreased fall risk.    Baseline 17.12 at eval    Time 9    Period Weeks    Status New      PT LONG TERM GOAL #3   Title Pt will improve Berg score to at least 39/56 for decreased fall risk.    Baseline 24/56 eval    Time 9    Period Weeks    Status New      PT LONG TERM GOAL #4   Title Pt will improve gait velocity to at least 2.62 ft/sec for improved gait efficiency and safety.    Baseline 2.2 ft/sec    Time 9    Period Weeks    Status New      PT LONG TERM GOAL #5   Title Pt will verbalize/demo understanding of continued community fitness plan to maximize functional gains made in PT.    Time 9    Period Weeks    Status New                 Plan - 08/09/20 9211    Clinical Impression Statement Today's skilled session intially focused on review of HEP issued at last session with cues needed for correct technique/form. Added 2 new ex's for balance with no issues noted or reported in session when performed with cues on technique/form. Session ended with use of Scifit for strengthening and activity tolerance. The pt is making progress toward goals and should benefit from continued PT to progress toward unmet goals.    Personal Factors and Comorbidities Comorbidity 3+;Time since onset of injury/illness/exacerbation    Comorbidities See PMH    Examination-Activity Limitations Locomotion Level;Transfers;Stand    Examination-Participation Restrictions Community Activity;Other   Community exercise; going out to eat   Stability/Clinical Decision Making Evolving/Moderate complexity     Rehab Potential Good    PT Frequency 2x / week    PT Duration Other (comment)   9 weeks, including eval week   PT Treatment/Interventions ADLs/Self Care Home Management;DME Instruction;Gait training;Stair training;Functional mobility training;Therapeutic activities;Therapeutic exercise;Balance training;Neuromuscular re-education;Patient/family education    PT Next Visit Plan continue use of machines in clinic (seated stepper, leg press) that pt can be using in the gym he has access to. continue to work on strengthening and balance training, gait with rollator around objects, ramps/curbs as some point    PT Home Exercise Plan Access Code: [place] and Agree with Plan of Care Patient;Family member/caregiver    Family Member Consulted wife           Patient will benefit from skilled therapeutic intervention in order to improve the following deficits and impairments:  Abnormal gait,Difficulty walking,Decreased safety awareness,Decreased balance,Decreased mobility,Decreased strength,Postural dysfunction  Visit Diagnosis: Other abnormalities of gait and mobility  Unsteadiness on feet  Muscle weakness (generalized)  Abnormal posture     Problem List Patient Active Problem List   Diagnosis Date Noted  . Routine general medical examination at a health care facility 12/09/2019  . Hypotension 12/07/2019  .  Psychophysiological insomnia 12/07/2019  . Deficiency anemia 06/01/2019  . Vitamin D deficiency 06/01/2019  . Chronic idiopathic constipation 06/01/2019  . Hyperglycemia 04/15/2019  . Mild intermittent asthma with acute exacerbation 02/08/2018  . Hypothyroidism 03/14/2013  . Hyperlipidemia LDL goal <70 03/14/2013  . BPH (benign prostatic hypertrophy) 03/14/2013  . Hypogonadism, male 03/14/2013  . Erectile dysfunction 03/14/2013    Willow Ora, PTA, [facility] Outpatient Neuro [facility] [address] [place], [place] [address] [phone] 08/09/20, 2:30 PM     Name: Benjamin Hahn MRN: [id number] Date of Birth: 24-Apr-1933

## 2020-08-15 ENCOUNTER — Ambulatory Visit: Payer: Medicare Other | Admitting: Physical Therapy

## 2020-08-21 ENCOUNTER — Ambulatory Visit: Payer: Medicare Other | Admitting: Physical Therapy

## 2020-08-21 ENCOUNTER — Other Ambulatory Visit: Payer: Self-pay

## 2020-08-21 ENCOUNTER — Encounter: Payer: Self-pay | Admitting: Physical Therapy

## 2020-08-21 DIAGNOSIS — M6281 Muscle weakness (generalized): Secondary | ICD-10-CM

## 2020-08-21 DIAGNOSIS — R2681 Unsteadiness on feet: Secondary | ICD-10-CM | POA: Diagnosis not present

## 2020-08-21 DIAGNOSIS — R2689 Other abnormalities of gait and mobility: Secondary | ICD-10-CM | POA: Diagnosis not present

## 2020-08-21 DIAGNOSIS — R293 Abnormal posture: Secondary | ICD-10-CM | POA: Diagnosis not present

## 2020-08-21 NOTE — Patient Instructions (Signed)
Access Code: [email or web address] URL: [email or web address] Date: 08/21/2020 Prepared by: Lonia Blood  Exercises Standing March with Counter Support - 1 x daily - 5 x weekly - 1 sets - 10 reps - 3 hold Standing Hip Abduction with Counter Support - 1 x daily - 5 x weekly - 1 sets - 10 reps Heel rises with counter support - 1 x daily - 5 x weekly - 3 sets - 10 reps Recumbent Bike - 1 x daily - 3 x weekly - 5-7 minutes time Standing Tandem Balance with Counter Support - 1 x daily - 5 x weekly - 1 sets - 3 reps - 30 hold Standing Single Leg Stance with Counter Support - 1 x daily - 5 x weekly - 1 sets - 3 reps - 10 hold Mini Squat with Counter Support - 1 x daily - 5 x weekly - 3 sets - 10 reps

## 2020-08-21 NOTE — Therapy (Signed)
[facility] [address] [place], [place], [address] Phone: [phone]   Fax:  [phone]  Physical Therapy Treatment  Patient Details  Name: Benjamin Hahn MRN: [id number] Date of Birth: 1933/01/21 Referring Provider (PT): Pati Gallo, MD   Encounter Date: 08/21/2020   PT End of Session - 08/21/20 0938    Visit Number 4    Number of Visits 18    Date for PT Re-Evaluation 10/27/20    Authorization Type Medicare    Authorization Time Period Will need KX starting after eval (due to therapy already this year)    Progress Note Due on Visit 10    PT Start Time [phone]    PT Stop Time 0930    PT Time Calculation (min) 44 min    Activity Tolerance Patient tolerated treatment well    Behavior During Therapy [facility] for tasks assessed/performed           Past Medical History:  Diagnosis Date   Anxiety    Arthritis    might be in back, no problems   BPH (benign prostatic hyperplasia)    Depression 1990s   GERD (gastroesophageal reflux disease)    occasional   Hypercholesteremia    controled   Hypothyroid    MI (myocardial infarction) [facility])    Shingles May 2014   "Right face, still has some"   Temporal arteritis (HCC)    Transient blindness of both eyes     Past Surgical History:  Procedure Laterality Date   ARTERY BIOPSY Right 06/20/2013   Procedure: BIOPSY TEMPORAL ARTERY RIGHT;  Surgeon: Velora Heckler, MD;  Location: [facility] ORS;  Service: General;  Laterality: Right;   CARDIAC CATHETERIZATION N/A 09/02/2015   Procedure: Left Heart Cath and Coronary Angiography;  Surgeon: Rinaldo Cloud, MD;  Location: [facility] INVASIVE CV LAB;  Service: Cardiovascular;  Laterality: N/A;   CARDIAC CATHETERIZATION N/A 09/02/2015   Procedure: Coronary Stent Intervention;  Surgeon: Rinaldo Cloud, MD;  Location: [facility] INVASIVE CV LAB;  Service: Cardiovascular;  Laterality: N/A;  1.  mid RCA      (3.0/28mm Xience) 2.  Mid LAD       (3.0/23mm Xience)   CRANIOTOMY N/A 04/19/2019   Procedure: CRANIOTOMY HEMATOMA EVACUATION SUBDURAL;  Surgeon: Shirlean Kelly, MD;  Location: [facility] OR;  Service: Neurosurgery;  Laterality: N/A;  CRANIOTOMY HEMATOMA EVACUATION SUBDURAL   None      There were no vitals filed for this visit.   Subjective Assessment - 08/21/20 0848    Subjective Exercises are going good.  No falls.    Patient is accompained by: Family member   spouse in lobby   Patient Stated Goals Patient:  Would like to graduate to cane if I could.  Wife:  want to make sure he's balanced and not falling; the walker is okay with me.    Currently in Pain? No/denies                             [facility] Adult PT Treatment/Exercise - 08/21/20 0001      Ambulation/Gait   Ambulation/Gait Yes    Ambulation/Gait Assistance 4: Min guard    Ambulation/Gait Assistance Details Cues for posture, for increased step length and push-off    Ambulation Distance (Feet) 215 Feet   80 ft x 2   Assistive device Rollator    Gait Pattern Step-through pattern;Step-to pattern;Decreased step length - right;Decreased hip/knee flexion - right;Decreased  dorsiflexion - right;Shuffle    Ambulation Surface Indoor;Level      Knee/Hip Exercises: Standing   Hip Flexion AROM;Stengthening;Right;Left;1 set;10 reps;Knee straight   At locked rollator   Hip Flexion Limitations Cues to "kick" for initiating increased step length with gait    Forward Step Up Right;Left;10 reps;Hand Hold: 2;Step Height: 4";2 sets    Forward Step Up Limitations 1st set:  step up/up, down/down x 10 reps; then 2nd step single limb step up 3 sec hold    Functional Squat 1 set;10 reps    Functional Squat Limitations Standing locked rollator, but instructed to perform at counter at home      Knee/Hip Exercises: Seated   Other Seated Knee/Hip Exercises ankle pumps x 10 reps    Marching Strengthening;Right;Left;1 set;10 reps            Reviewed Exercises in HEP, pt  reports doing about 3x/wk and not doing the recumbent bike over the Christmas holiday.  Standing March with Counter Support - 1 x daily - 5 x weekly - 1 sets - 10 reps - 3 hold Standing Hip Abduction with Counter Support - 1 x daily - 5 x weekly - 1 sets - 10 reps Heel rises with counter support - 1 x daily - 5 x weekly - 3 sets - 10 reps Recumbent Bike - 1 x daily - 3 x weekly - 5-7 minutes time (did not perform today) Standing Tandem Balance with Counter Support - 1 x daily - 5 x weekly - 1 sets - 3 reps - 30 hold Standing Single Leg Stance with Counter Support - 1 x daily - 5 x weekly - 1 sets - 3 reps - 10 hold  Pt reports dizziness after standing tandem balance.  Sat and took BP:  102/58, HR 68       PT Education - 08/21/20 0938    Education Details Added minisquat to HEP; discussed scheduling, with pt preferring to go down to 1x/wk    Person(s) Educated Patient    Methods Explanation;Demonstration;Handout;Verbal cues    Comprehension Verbalized understanding;Returned demonstration;Verbal cues required            PT Short Term Goals - 07/29/20 1557      PT SHORT TERM GOAL #1   Title Pt will perform HEP with family supervision, for improved strength, balance, and gait.  TARGET 08/30/2020    Time 5    Period Weeks    Status New      PT SHORT TERM GOAL #2   Title Pt will improve TUG score to less than or equal to 15 seconds for decreased fall risk.    Baseline 17.12    Time 5    Period Weeks    Status New      PT SHORT TERM GOAL #3   Title Pt will improve Berg Balance score to at least 29/56 for decreased fall risk.    Baseline 24/56    Time 5    Period Weeks    Status New      PT SHORT TERM GOAL #4   Title Pt will improve 5x sit<>stand to less than or equal to 15 seconds for improved functional lower extremity strength.    Baseline 19.13 sec at eval    Time 5    Period Weeks    Status New      PT SHORT TERM GOAL #5   Title Pt will verbalize understanding of  fall prevention in home envrionment.  Time 5    Period Weeks    Status New             PT Long Term Goals - 07/29/20 1600      PT LONG TERM GOAL #1   Title Pt will perform progression of HEP for improved balance, strength, and gait.  For all LTGs:  TARGET 09/27/2020    Time 9    Period Weeks    Status New      PT LONG TERM GOAL #2   Title Pt will improve TUG score to less than or equal to 13.5 seconds for decreased fall risk.    Baseline 17.12 at eval    Time 9    Period Weeks    Status New      PT LONG TERM GOAL #3   Title Pt will improve Berg score to at least 39/56 for decreased fall risk.    Baseline 24/56 eval    Time 9    Period Weeks    Status New      PT LONG TERM GOAL #4   Title Pt will improve gait velocity to at least 2.62 ft/sec for improved gait efficiency and safety.    Baseline 2.2 ft/sec    Time 9    Period Weeks    Status New      PT LONG TERM GOAL #5   Title Pt will verbalize/demo understanding of continued community fitness plan to maximize functional gains made in PT.    Time 9    Period Weeks    Status New                 Plan - 08/21/20 0939    Clinical Impression Statement Reviewed HEP this visit, with pt return demo understanding with picture cues on computer and VCs of therapist.  Pt does report not remembering the SLS exercise added last visit.  He has a bit of reported dizziness during review of HEP, but subsides with seated rest break.  Focused most of session on lower extremity strengthening and gait training with increased step length and foot clearance.  He will continue to benefit from skilled PT to address balance, strength, and gait for decreased fall risk and improved functional mobility.    Personal Factors and Comorbidities Comorbidity 3+;Time since onset of injury/illness/exacerbation    Comorbidities See PMH    Examination-Activity Limitations Locomotion Level;Transfers;Stand    Examination-Participation Restrictions  Community Activity;Other   Community exercise; going out to eat   Stability/Clinical Decision Making Evolving/Moderate complexity    Rehab Potential Good    PT Frequency 2x / week    PT Duration Other (comment)   9 weeks, including eval week   PT Treatment/Interventions ADLs/Self Care Home Management;DME Instruction;Gait training;Stair training;Functional mobility training;Therapeutic activities;Therapeutic exercise;Balance training;Neuromuscular re-education;Patient/family education    PT Next Visit Plan Check STGs; continue use of machines in clinic (seated stepper, leg press) that pt can be using in the gym he has access to. continue to work on strengthening and balance training, gait with rollator around objects, ramps/curbs as some point.  Review minisquats added to HEP.  (Pt requests to go down to 1x/wk due to transportation issues with wife, so will need to make sure he is performing HEP, walking, gym activities at home).    PT Home Exercise Plan Access Code: [email or web address]    Consulted and Agree with Plan of Care Patient           Patient will benefit from  skilled therapeutic intervention in order to improve the following deficits and impairments:  Abnormal gait,Difficulty walking,Decreased safety awareness,Decreased balance,Decreased mobility,Decreased strength,Postural dysfunction  Visit Diagnosis: Muscle weakness (generalized)  Other abnormalities of gait and mobility  Unsteadiness on feet     Problem List Patient Active Problem List   Diagnosis Date Noted   Routine general medical examination at a health care facility 12/09/2019   Hypotension 12/07/2019   Psychophysiological insomnia 12/07/2019   Deficiency anemia 06/01/2019   Vitamin D deficiency 06/01/2019   Chronic idiopathic constipation 06/01/2019   Hyperglycemia 04/15/2019   Mild intermittent asthma with acute exacerbation 02/08/2018   Hypothyroidism 03/14/2013   Hyperlipidemia LDL goal <70 03/14/2013    BPH (benign prostatic hypertrophy) 03/14/2013   Hypogonadism, male 03/14/2013   Erectile dysfunction 03/14/2013    Aliyana Dlugosz W. 08/21/2020, 9:47 AM Gean Maidens., PT  [facility] [address] [place], [place], [address] Phone: [phone]   Fax:  [phone]  Name: Benjamin Hahn MRN: [id number] Date of Birth: 1933-06-20

## 2020-08-28 ENCOUNTER — Ambulatory Visit: Payer: Medicare Other | Admitting: Physical Therapy

## 2020-08-29 DIAGNOSIS — M7581 Other shoulder lesions, right shoulder: Secondary | ICD-10-CM | POA: Diagnosis not present

## 2020-08-30 ENCOUNTER — Other Ambulatory Visit: Payer: Self-pay | Admitting: Internal Medicine

## 2020-09-02 ENCOUNTER — Ambulatory Visit: Payer: Medicare Other | Admitting: Physical Therapy

## 2020-09-03 ENCOUNTER — Telehealth: Payer: Self-pay | Admitting: Internal Medicine

## 2020-09-03 ENCOUNTER — Other Ambulatory Visit: Payer: Self-pay | Admitting: Internal Medicine

## 2020-09-03 NOTE — Telephone Encounter (Signed)
I am not excepting new patients now and did not except interoffice transfers.

## 2020-09-03 NOTE — Telephone Encounter (Signed)
Ok with me 

## 2020-09-03 NOTE — Telephone Encounter (Signed)
   Spouse calling to request Benjamin Hahn appointment with Benjamin Hahn The patient feels Benjamin Hahn is a better fit for patient   Please advise

## 2020-09-04 ENCOUNTER — Other Ambulatory Visit: Payer: Self-pay

## 2020-09-04 ENCOUNTER — Telehealth (INDEPENDENT_AMBULATORY_CARE_PROVIDER_SITE_OTHER): Payer: Medicare Other | Admitting: Psychiatry

## 2020-09-04 DIAGNOSIS — F330 Major depressive disorder, recurrent, mild: Secondary | ICD-10-CM | POA: Diagnosis not present

## 2020-09-04 MED ORDER — BUPROPION HCL ER (XL) 300 MG PO TB24
300.0000 mg | ORAL_TABLET | Freq: Every day | ORAL | 2 refills | Status: DC
Start: 2020-09-04 — End: 2020-10-02

## 2020-09-04 MED ORDER — TEMAZEPAM 15 MG PO CAPS: 15.0000 mg | ORAL_CAPSULE | Freq: Every evening | ORAL | 4 refills | Status: DC | PRN

## 2020-09-04 MED ORDER — ALPRAZOLAM 0.5 MG PO TABS: ORAL_TABLET | ORAL | 3 refills | Status: DC

## 2020-09-04 MED ORDER — VENLAFAXINE HCL ER 75 MG PO CP24: 75.0000 mg | ORAL_CAPSULE | Freq: Every day | ORAL | 6 refills | Status: DC

## 2020-09-04 NOTE — Progress Notes (Signed)
Patient ID: Benjamin Hahn, male   DOB: 05/09/1933, 87 y.o.   MRN: [id number] [facility] MD/PA/NP OP Progress Note  09/04/2020 2:57 PM DOUG MASLO  MRN:  [id number]  Chief Complaint: Major depression remission Subjective: Doing great  At this time the patient is not doing as well as usual.  The patient feels isolated and lonely.  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  He describes not having any close friends as most of them have died.  He has issues with his wife.  He has 2 daughters and 1 in [place] and one in [place] which apparently does not have all that much contact with.  The patient is very inactive.  He does watch some TV.  He is sleeping and eating fairly well.  He takes Restoril 15 mg and then half of the week will need an extra small dose of Xanax 0.5 mg half of the pill to get him to sleep the rest of the night through.  He does not take any Xanax during the day.  Patient is takes Wellbutrin 300 mg and Effexor 37.5 mg.  Patient is not drinking any alcohol.  He is very physically limited.  He cannot drive.  He uses a walker.  He needs assistance to get around.  I think the patient is lonely and isolated and wishes to see me face-to-face.  I think the possibility of recommending his therapy will be important for this patient.  He is not suicidal.  His thinking and concentration to be intact.  He denies a feeling of worthlessness.  The patient has no psychotic symptomatology.  Past Medical History:  Diagnosis Date  . Anxiety   . Arthritis    might be in back, no problems  . BPH (benign prostatic hyperplasia)   . Depression 1990s  . GERD (gastroesophageal reflux disease)    occasional  . Hypercholesteremia    controled  . Hypothyroid   . MI (myocardial infarction) ([place])   . Shingles May 2014   "Right face, still has some"  . Temporal arteritis ([place])   . Transient blindness of both eyes     Past Surgical History:  Procedure Laterality Date  . ARTERY BIOPSY Right 06/20/2013    Procedure: BIOPSY TEMPORAL ARTERY RIGHT;  Surgeon: Earnstine Regal, MD;  [facility];  Service: General;  Laterality: Right;  . CARDIAC CATHETERIZATION N/A 09/02/2015   Procedure: Left Heart Cath and Coronary Angiography;  Surgeon: Charolette Forward, MD;  Location: [place] [facility];  Service: Cardiovascular;  Laterality: N/A;  . CARDIAC CATHETERIZATION N/A 09/02/2015   Procedure: Coronary Stent Intervention;  Surgeon: Charolette Forward, MD;  Location: [place] [facility];  Service: Cardiovascular;  Laterality: N/A;  1.  mid RCA      (3.0/28mm Xience) 2.  Mid LAD      (3.0/23mm Xience)  . CRANIOTOMY N/A 04/19/2019   Procedure: CRANIOTOMY HEMATOMA EVACUATION SUBDURAL;  Surgeon: Jovita Gamma, MD;  Location: [place];  Service: Neurosurgery;  Laterality: N/A;  CRANIOTOMY HEMATOMA EVACUATION SUBDURAL  . None      Family Psychiatric History:   Family History:  Family History  Problem Relation Age of Onset  . Stroke Father        Died, 80s  . Stroke Sister        Living, 74  . Heart disease Mother        Died, 70  . Healthy Daughter   . Diabetes Maternal Grandmother   . Hypertension Neg  Hx     Social History:  Social History   Socioeconomic History  . Marital status: Married    Spouse name: Not on file  . Number of children: Not on file  . Years of education: Not on file  . Highest education level: Not on file  Occupational History  . Not on file  Tobacco Use  . Smoking status: Former Smoker    Types: Cigarettes    Quit date: 08/24/1966    Years since quitting: 54.0  . Smokeless tobacco: Never Used  Substance and Sexual Activity  . Alcohol use: Not Currently    Comment: socially  . Drug use: Never  . Sexual activity: Not on file  Other Topics Concern  . Not on file  Social History Narrative   ** Merged History Encounter **       Currently works as a Midwife.  Lives with wife and they have two healthy daughters.   Social Determinants of Health   Financial Resource  Strain: Not on file  Food Insecurity: Not on file  Transportation Needs: Not on file  Physical Activity: Not on file  Stress: Not on file  Social Connections: Not on file    Allergies:  Allergies  Allergen Reactions  . Ativan [Lorazepam] Anxiety    Per other chart in Epic  . Lorazepam Anxiety    Metabolic Disorder Labs: Lab Results  Component Value Date   HGBA1C 5.5 04/14/2019   No results found for: PROLACTIN Lab Results  Component Value Date   CHOL 126 06/01/2019   TRIG 91.0 06/01/2019   HDL 56.50 06/01/2019   CHOLHDL 2 06/01/2019   VLDL 18.2 06/01/2019   LDLCALC 51 06/01/2019   LDLCALC 52 07/28/2018     Current Medications: Current Outpatient Medications  Medication Sig Dispense Refill  . acetaminophen (TYLENOL) 325 MG tablet Take 1-2 tablets (325-650 mg total) by mouth every 4 (four) hours as needed for mild pain or headache.    [name] acetaminophen (TYLENOL) 325 MG tablet Take 325-650 mg by mouth every 6 (six) hours as needed for mild pain or headache.    . ALPRAZolam (XANAX) 0.5 MG tablet Half q day  prn 25 tablet 3  . atorvastatin (LIPITOR) 40 MG tablet Take 1 tablet (40 mg total) by mouth daily at 6 PM. 30 tablet 3  . buPROPion (WELLBUTRIN XL) 300 MG 24 hr tablet Take 1 tablet (300 mg total) by mouth daily. 90 tablet 2  . HYDROcodone-acetaminophen (NORCO) 10-325 MG tablet     . hydrOXYzine (ATARAX/VISTARIL) 25 MG tablet Take 1 tablet (25 mg total) by mouth at bedtime. 30 tablet 1  . levothyroxine (SYNTHROID) 88 MCG tablet TAKE 1 TABLET BY MOUTH DAILY. 30 tablet 0  . lidocaine (LIDODERM) 5 % Place 1 patch onto the skin daily. Remove & Discard patch within 12 hours or as directed by MD 30 patch 0  . metoprolol succinate (TOPROL-XL) 25 MG 24 hr tablet TAKE (1/2) TABLET DAILY. 15 tablet 0  . Plecanatide (TRULANCE) 3 MG TABS Take 1 tablet by mouth daily. (Patient not taking: Reported on 07/29/2020) 90 tablet 1  . ramipril (ALTACE) 1.25 MG capsule Take 1.25 mg by mouth  daily after supper.    . temazepam (RESTORIL) 15 MG capsule Take 1 capsule (15 mg total) by mouth at bedtime as needed for sleep. (Patient not taking: Reported on 07/29/2020) 30 capsule 0  . temazepam (RESTORIL) 15 MG capsule Take 1 capsule (15 mg total) by mouth at  bedtime as needed for sleep. 30 capsule 4  . venlafaxine XR (EFFEXOR-XR) 75 MG 24 hr capsule Take 1 capsule (75 mg total) by mouth daily. 30 capsule 6   No current facility-administered medications for this visit.    Neurologic: Headache: No Seizure: No Paresthesias: No  Musculoskeletal: Strength & Muscle Tone: within normal limits Gait & Station: normal Patient leans: NA  Psychiatric Specialty Exam: ROS  There were no vitals taken for this visit.There is no height or weight on file to calculate BMI.  General Appearance: Fairly Groomed  Eye Contact:  Good  Speech:  Clear and Coherent  Volume:  Normal  Mood:  Euthymic  Affect:  Appropriate  Thought Process:  Coherent  Orientation:  Full (Time, Place, and Person)  Thought Content:  WDL  Suicidal Thoughts:  No  Homicidal Thoughts:  No  Memory:  NA  Judgement:  Good  Insight:  Good  Psychomotor Activity:  Normal  Concentration:  Good  Recall:  Good  Fund of Knowledge: Good  Language: Good  Akathisia:  No  Handed:  Right  AIMS (if indicated):    Assets:  Desire for Improvement  ADL's:  Intact  Cognition: WNL  Sleep:      Treatment/plan This patient's first problem is that of major depression which seems to have gotten worse.  Today we will go ahead and increase his Effexor from 37.5 mg to a higher dose of 75 mg.  He will continue taking Wellbutrin 300 mg in the morning and he will continue taking Xanax 0.5 mg half of a pill as needed at night.  His second problem is that of insomnia.  He will continue taking Restoril at 15 mg.  Patient will be seen again hopefully face-to-face in 2 months.  I think the patient is showing some degree of decline.  09/04/2020,  2:57 PM [facility] MD Progress Note  09/04/2020 2:57 PM MALYKAI SCHOMMER  MRN:  [id number] Subjective:  Feels well Principal Problem: Major depression, chronic, mild/residual Diagnosis:  Major depression, recurrent residual Today the patient is doing fairly well. He's been having some medical problems. He recently went for Thanksgiving to a length but unfortunately had a urinary tract infection. He also since then has been coughing and recently started on antibiotic. Emotionally he is fairly stable. He denies daily depression. He stays very active at work. He's been unable to exercise. The patient is sleeping well and has an increase in his appetite. His energy is good. Is no problems thinking or concentrating. He is recently having a productive cough but is no shortness of breath. He denies any chest pain. Is a stable relationship with his wife. His kidneys are good. He has 4 grandchildren. Everybody in his home is good. He only issue is that a number mornings he awakens and he feels oversedated. It occurs point of his wife feels uncomfortable with him driving will taken to work. Today reviewed his medications and is evident that he likely needs reduction. He also notes that his memory has change in his and is good. 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 Patient Active Problem List   Diagnosis Date Noted  . Routine general medical examination at a health care facility [Z00.00] 12/09/2019  . Hypotension [I95.9] 12/07/2019  . Psychophysiological insomnia [F51.04] 12/07/2019  . Deficiency anemia [D53.9] 06/01/2019  . Vitamin D deficiency [E55.9] 06/01/2019  . Chronic idiopathic constipation [K59.04] 06/01/2019  . Hyperglycemia [R73.9] 04/15/2019  .  Mild intermittent asthma with acute exacerbation [J45.21] 02/08/2018  . Hypothyroidism [E03.9] 03/14/2013  . Hyperlipidemia LDL goal <70 [E78.5] 03/14/2013  . BPH (benign prostatic hypertrophy) [N40.0]  03/14/2013  . Hypogonadism, male [E29.1] 03/14/2013  . Erectile dysfunction [N52.9] 03/14/2013   Total Time spent with patient: 30 minutes  Past Psychiatric History:   Past Medical History:  Past Medical History:  Diagnosis Date  . Anxiety   . Arthritis    might be in back, no problems  . BPH (benign prostatic hyperplasia)   . Depression 1990s  . GERD (gastroesophageal reflux disease)    occasional  . Hypercholesteremia    controled  . Hypothyroid   . MI (myocardial infarction) ([place])   . Shingles May 2014   "Right face, still has some"  . Temporal arteritis ([place])   . Transient blindness of both eyes     Past Surgical History:  Procedure Laterality Date  . ARTERY BIOPSY Right 06/20/2013   Procedure: BIOPSY TEMPORAL ARTERY RIGHT;  Surgeon: Earnstine Regal, MD;  [facility];  Service: General;  Laterality: Right;  . CARDIAC CATHETERIZATION N/A 09/02/2015   Procedure: Left Heart Cath and Coronary Angiography;  Surgeon: Charolette Forward, MD;  Location: [place] [facility];  Service: Cardiovascular;  Laterality: N/A;  . CARDIAC CATHETERIZATION N/A 09/02/2015   Procedure: Coronary Stent Intervention;  Surgeon: Charolette Forward, MD;  Location: [place] [facility];  Service: Cardiovascular;  Laterality: N/A;  1.  mid RCA      (3.0/28mm Xience) 2.  Mid LAD      (3.0/23mm Xience)  . CRANIOTOMY N/A 04/19/2019   Procedure: CRANIOTOMY HEMATOMA EVACUATION SUBDURAL;  Surgeon: Jovita Gamma, MD;  Location: [place];  Service: Neurosurgery;  Laterality: N/A;  CRANIOTOMY HEMATOMA EVACUATION SUBDURAL  . None     Family History:  Family History  Problem Relation Age of Onset  . Stroke Father        Died, 80s  . Stroke Sister        Living, 54  . Heart disease Mother        Died, 32  . Healthy Daughter   . Diabetes Maternal Grandmother   . Hypertension Neg Hx    Family Psychiatric  History:  Social History:  Social History   Substance and Sexual Activity  Alcohol Use Not Currently    Comment: socially     Social History   Substance and Sexual Activity  Drug Use Never    Social History   Socioeconomic History  . Marital status: Married    Spouse name: Not on file  . Number of children: Not on file  . Years of education: Not on file  . Highest education level: Not on file  Occupational History  . Not on file  Tobacco Use  . Smoking status: Former Smoker    Types: Cigarettes    Quit date: 08/24/1966    Years since quitting: 54.0  . Smokeless tobacco: Never Used  Substance and Sexual Activity  . Alcohol use: Not Currently    Comment: socially  . Drug use: Never  . Sexual activity: Not on file  Other Topics Concern  . Not on file  Social History Narrative   ** Merged History Encounter **       Currently works as a Midwife.  Lives with wife and they have two healthy daughters.   Social Determinants of Health   Financial Resource Strain: Not on file  Food Insecurity: Not on file  Transportation Needs: Not on file  Physical Activity: Not on file  Stress: Not on file  Social Connections: Not on file   Additional Social History:                         Sleep: Good  Appetite:  Good  Current Medications: Current Outpatient Medications  Medication Sig Dispense Refill  . acetaminophen (TYLENOL) 325 MG tablet Take 1-2 tablets (325-650 mg total) by mouth every 4 (four) hours as needed for mild pain or headache.    [name] acetaminophen (TYLENOL) 325 MG tablet Take 325-650 mg by mouth every 6 (six) hours as needed for mild pain or headache.    . ALPRAZolam (XANAX) 0.5 MG tablet Half q day  prn 25 tablet 3  . atorvastatin (LIPITOR) 40 MG tablet Take 1 tablet (40 mg total) by mouth daily at 6 PM. 30 tablet 3  . buPROPion (WELLBUTRIN XL) 300 MG 24 hr tablet Take 1 tablet (300 mg total) by mouth daily. 90 tablet 2  . HYDROcodone-acetaminophen (NORCO) 10-325 MG tablet     . hydrOXYzine (ATARAX/VISTARIL) 25 MG tablet Take 1 tablet (25 mg total) by  mouth at bedtime. 30 tablet 1  . levothyroxine (SYNTHROID) 88 MCG tablet TAKE 1 TABLET BY MOUTH DAILY. 30 tablet 0  . lidocaine (LIDODERM) 5 % Place 1 patch onto the skin daily. Remove & Discard patch within 12 hours or as directed by MD 30 patch 0  . metoprolol succinate (TOPROL-XL) 25 MG 24 hr tablet TAKE (1/2) TABLET DAILY. 15 tablet 0  . Plecanatide (TRULANCE) 3 MG TABS Take 1 tablet by mouth daily. (Patient not taking: Reported on 07/29/2020) 90 tablet 1  . ramipril (ALTACE) 1.25 MG capsule Take 1.25 mg by mouth daily after supper.    . temazepam (RESTORIL) 15 MG capsule Take 1 capsule (15 mg total) by mouth at bedtime as needed for sleep. (Patient not taking: Reported on 07/29/2020) 30 capsule 0  . temazepam (RESTORIL) 15 MG capsule Take 1 capsule (15 mg total) by mouth at bedtime as needed for sleep. 30 capsule 4  . venlafaxine XR (EFFEXOR-XR) 75 MG 24 hr capsule Take 1 capsule (75 mg total) by mouth daily. 30 capsule 6   No current facility-administered medications for this visit.    Lab Results:  No results found for this or any previous visit (from the past 48 hour(s)).  Physical Findings: AIMS:  , ,  ,  ,    CIWA:    COWS:     Musculoskeletal: Strength & Muscle Tone: within normal limits Gait & Station: normal Patient leans: Right  Psychiatric Specialty Exam: ROS  There were no vitals taken for this visit.There is no height or weight on file to calculate BMI.  General Appearance: Negative  Eye Contact::  Good  Speech:  Clear and Coherent  Volume:  Normal  Mood:  NA  Affect:  Appropriate  Thought Process:  Coherent  Orientation:  Full (Time, Place, and Person)  Thought Content:  WDL  Suicidal Thoughts:  No  Homicidal Thoughts:  No  Memory:  NA  Judgement:  Good  Insight:  Good  Psychomotor Activity:  Normal  Concentration:  Good  Recall:  Good  Fund of Knowledge:Good  Language: Good  Akathisia:  No  Handed:  Right  AIMS (if indicated):     Assets:  Desire  for Improvement  ADL's:  Intact  Cognition: WNL  Sleep:  [facility] MD Progress Note  09/04/2020 2:57 PM YASSIR ENIS  MRN:  [id number] Subjective:  Feels well Principal Problem: Major depression, chronic, mild/residual Diagnosis:  Major depression, recurrent residual Today the patient is doing fairly well. He's been having some medical problems. He recently went for Thanksgiving to a length but unfortunately had a urinary tract infection. He also since then has been coughing and recently started on antibiotic. Emotionally he is fairly stable. He denies daily depression. He stays very active at work. He's been unable to exercise. The patient is sleeping well and has an increase in his appetite. His energy is good. Is no problems thinking or concentrating. He is recently having a productive cough but is no shortness of breath. He denies any chest pain. Is a stable relationship with his wife. His kidneys are good. He has 4 grandchildren. Everybody in his home is good. He only issue is that a number mornings he awakens and he feels oversedated. It occurs point of his wife feels uncomfortable with him driving will taken to work. Today reviewed his medications and is evident that he likely needs reduction. He also notes that his memory has change in his and is good. 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 Patient Active Problem List   Diagnosis Date Noted  . Routine general medical examination at a health care facility [Z00.00] 12/09/2019  . Hypotension [I95.9] 12/07/2019  . Psychophysiological insomnia [F51.04] 12/07/2019  . Deficiency anemia [D53.9] 06/01/2019  . Vitamin D deficiency [E55.9] 06/01/2019  . Chronic idiopathic constipation [K59.04] 06/01/2019  . Hyperglycemia [R73.9] 04/15/2019  . Mild intermittent asthma with acute exacerbation [J45.21] 02/08/2018  . Hypothyroidism [E03.9] 03/14/2013  . Hyperlipidemia LDL  goal <70 [E78.5] 03/14/2013  . BPH (benign prostatic hypertrophy) [N40.0] 03/14/2013  . Hypogonadism, male [E29.1] 03/14/2013  . Erectile dysfunction [N52.9] 03/14/2013   Total Time spent with patient: 30 minutes  Past Psychiatric History:   Past Medical History:  Past Medical History:  Diagnosis Date  . Anxiety   . Arthritis    might be in back, no problems  . BPH (benign prostatic hyperplasia)   . Depression 1990s  . GERD (gastroesophageal reflux disease)    occasional  . Hypercholesteremia    controled  . Hypothyroid   . MI (myocardial infarction) ([place])   . Shingles May 2014   "Right face, still has some"  . Temporal arteritis ([place])   . Transient blindness of both eyes     Past Surgical History:  Procedure Laterality Date  . ARTERY BIOPSY Right 06/20/2013   Procedure: BIOPSY TEMPORAL ARTERY RIGHT;  Surgeon: Earnstine Regal, MD;  [facility];  Service: General;  Laterality: Right;  . CARDIAC CATHETERIZATION N/A 09/02/2015   Procedure: Left Heart Cath and Coronary Angiography;  Surgeon: Charolette Forward, MD;  Location: [place] [facility];  Service: Cardiovascular;  Laterality: N/A;  . CARDIAC CATHETERIZATION N/A 09/02/2015   Procedure: Coronary Stent Intervention;  Surgeon: Charolette Forward, MD;  Location: [place] [facility];  Service: Cardiovascular;  Laterality: N/A;  1.  mid RCA      (3.0/28mm Xience) 2.  Mid LAD      (3.0/23mm Xience)  . CRANIOTOMY N/A 04/19/2019   Procedure: CRANIOTOMY HEMATOMA EVACUATION SUBDURAL;  Surgeon: Jovita Gamma, MD;  Location: [place];  Service: Neurosurgery;  Laterality: N/A;  CRANIOTOMY HEMATOMA EVACUATION SUBDURAL  . None     Family History:  Family History  Problem Relation Age of Onset  . Stroke Father        Died, 4880s  . Stroke Sister        Living, 3075  . Heart disease Mother        Died, 3792  . Healthy Daughter   . Diabetes Maternal Grandmother   . Hypertension Neg Hx    Family Psychiatric  History:  Social History:  Social  History   Substance and Sexual Activity  Alcohol Use Not Currently   Comment: socially     Social History   Substance and Sexual Activity  Drug Use Never    Social History   Socioeconomic History  . Marital status: Married    Spouse name: Not on file  . Number of children: Not on file  . Years of education: Not on file  . Highest education level: Not on file  Occupational History  . Not on file  Tobacco Use  . Smoking status: Former Smoker    Types: Cigarettes    Quit date: 08/24/1966    Years since quitting: 54.0  . Smokeless tobacco: Never Used  Substance and Sexual Activity  . Alcohol use: Not Currently    Comment: socially  . Drug use: Never  . Sexual activity: Not on file  Other Topics Concern  . Not on file  Social History Narrative   ** Merged History Encounter **       Currently works as a Scientist, clinical (histocompatibility and immunogenetics)civil lawyer.  Lives with wife and they have two healthy daughters.   Social Determinants of Health   Financial Resource Strain: Not on file  Food Insecurity: Not on file  Transportation Needs: Not on file  Physical Activity: Not on file  Stress: Not on file  Social Connections: Not on file   Additional Social History:                         Sleep: Good  Appetite:  Good  Current Medications: Current Outpatient Medications  Medication Sig Dispense Refill  . acetaminophen (TYLENOL) 325 MG tablet Take 1-2 tablets (325-650 mg total) by mouth every 4 (four) hours as needed for mild pain or headache.    [name]. acetaminophen (TYLENOL) 325 MG tablet Take 325-650 mg by mouth every 6 (six) hours as needed for mild pain or headache.    . ALPRAZolam (XANAX) 0.5 MG tablet Half q day  prn 25 tablet 3  . atorvastatin (LIPITOR) 40 MG tablet Take 1 tablet (40 mg total) by mouth daily at 6 PM. 30 tablet 3  . buPROPion (WELLBUTRIN XL) 300 MG 24 hr tablet Take 1 tablet (300 mg total) by mouth daily. 90 tablet 2  . HYDROcodone-acetaminophen (NORCO) 10-325 MG tablet     .  hydrOXYzine (ATARAX/VISTARIL) 25 MG tablet Take 1 tablet (25 mg total) by mouth at bedtime. 30 tablet 1  . levothyroxine (SYNTHROID) 88 MCG tablet TAKE 1 TABLET BY MOUTH DAILY. 30 tablet 0  . lidocaine (LIDODERM) 5 % Place 1 patch onto the skin daily. Remove & Discard patch within 12 hours or as directed by MD 30 patch 0  . metoprolol succinate (TOPROL-XL) 25 MG 24 hr tablet TAKE (1/2) TABLET DAILY. 15 tablet 0  . Plecanatide (TRULANCE) 3 MG TABS Take 1 tablet by mouth daily. (Patient not taking: Reported on 07/29/2020) 90 tablet 1  . ramipril (ALTACE) 1.25 MG capsule Take 1.25 mg by mouth daily after supper.    . temazepam (RESTORIL) 15  MG capsule Take 1 capsule (15 mg total) by mouth at bedtime as needed for sleep. (Patient not taking: Reported on 07/29/2020) 30 capsule 0  . temazepam (RESTORIL) 15 MG capsule Take 1 capsule (15 mg total) by mouth at bedtime as needed for sleep. 30 capsule 4  . venlafaxine XR (EFFEXOR-XR) 75 MG 24 hr capsule Take 1 capsule (75 mg total) by mouth daily. 30 capsule 6   No current facility-administered medications for this visit.    Lab Results:  No results found for this or any previous visit (from the past 48 hour(s)).  Physical Findings: AIMS:  , ,  ,  ,    CIWA:    COWS:     Musculoskeletal: Strength & Muscle Tone: within normal limits Gait & Station: normal Patient leans: Right  Psychiatric Specialty Exam: ROS  There were no vitals taken for this visit.There is no height or weight on file to calculate BMI.  General Appearance: Negative  Eye Contact::  Good  Speech:  Clear and Coherent  Volume:  Normal  Mood:  NA  Affect:  Appropriate  Thought Process:  Coherent  Orientation:  Full (Time, Place, and Person)  Thought Content:  WDL  Suicidal Thoughts:  No  Homicidal Thoughts:  No  Memory:  NA  Judgement:  Good  Insight:  Good  Psychomotor Activity:  Normal  Concentration:  Good  Recall:  Good  Fund of Knowledge:Good  Language: Good   Akathisia:  No  Handed:  Right  AIMS (if indicated):     Assets:  Desire for Improvement  ADL's:  Intact  Cognition: WNL  Sleep:      Treatment Plan Summary: This patient's first problem is that of major depression.  He will continue taking Wellbutrin 150 mg and Effexor as prescribed.  His second problem is that of insomnia.  The patient is doing very well taking Restoril and will continue taking that.  He has episodic anxiety.  For that he takes Xanax 0.5 mg a half as needed.  I shared with him that he can also take Vistaril instead of the Xanax.  In reality I do not mind if he takes some Vistaril and Xanax both of them on a as needed basis.  Will stay on Restoril for sleep.  To be seen again in a few months.  The patient actually is doing better than usual.

## 2020-09-05 NOTE — Telephone Encounter (Addendum)
° ° °  Patient and spouse made aware Dr Quay Burow not able to accept patient

## 2020-09-05 NOTE — Telephone Encounter (Addendum)
° °  Dr Sharlet Salina are you willing to see patient (TOC)  Spouse states she was not pleased with last office visit Also she is concerned about refill for levothyroxine (SYNTHROID) 88 MCG tablet Declined to schedule a follow up appointment to get refill (last seen 11/2019

## 2020-09-09 NOTE — Telephone Encounter (Signed)
We would not be able to transfer care without a visit so if they are unwilling to come in they may be better off to try to get refill from current PCP.

## 2020-09-11 ENCOUNTER — Ambulatory Visit: Payer: Medicare Other | Admitting: Physical Therapy

## 2020-09-11 ENCOUNTER — Other Ambulatory Visit: Payer: Self-pay | Admitting: Internal Medicine

## 2020-09-11 NOTE — Telephone Encounter (Signed)
Patients wife calling to see if they can get a short term refill for the medication, he only has 2 left and he has an appointment on 01.26.22

## 2020-09-16 ENCOUNTER — Ambulatory Visit: Payer: Medicare Other | Attending: Internal Medicine | Admitting: Physical Therapy

## 2020-09-16 ENCOUNTER — Encounter: Payer: Self-pay | Admitting: Physical Therapy

## 2020-09-16 ENCOUNTER — Other Ambulatory Visit: Payer: Self-pay

## 2020-09-16 DIAGNOSIS — R293 Abnormal posture: Secondary | ICD-10-CM | POA: Insufficient documentation

## 2020-09-16 DIAGNOSIS — R2681 Unsteadiness on feet: Secondary | ICD-10-CM | POA: Insufficient documentation

## 2020-09-16 DIAGNOSIS — R2689 Other abnormalities of gait and mobility: Secondary | ICD-10-CM | POA: Insufficient documentation

## 2020-09-16 DIAGNOSIS — M6281 Muscle weakness (generalized): Secondary | ICD-10-CM | POA: Diagnosis not present

## 2020-09-16 NOTE — Patient Instructions (Addendum)
It is important to avoid accidents which may result in broken bones.  Here are a few ideas on how to make your home safer so you will be less likely to trip or fall.  1. Use nonskid mats or non slip strips in your shower or tub, on your bathroom floor and around sinks.  If you know that you have spilled water, wipe it up! 2. In the bathroom, it is important to have properly installed grab bars on the walls or on the edge of the tub.  Towel racks are NOT strong enough for you to hold onto or to pull on for support. 3. Stairs and hallways should have enough light.  Add lamps or night lights if you need ore light. 4. It is good to have handrails on both sides of the stairs if possible.  Always fix broken handrails right away. 5. It is important to see the edges of steps.  Paint the edges of outdoor steps white so you can see them better.  Put colored tape on the edge of inside steps. 6. Throw-rugs are dangerous because they can slide.  Removing the rugs is the best idea, but if they must stay, add adhesive carpet tape to prevent slipping. 7. Do not keep things on stairs or in the halls.  Remove small furniture that blocks the halls as it may cause you to trip.  Keep telephone and electrical cords out of the way where you walk. 8. Always were sturdy, rubber-soled shoes for good support.  Never wear just socks, especially on the stairs.  Socks may cause you to slip or fall.  Do not wear full-length housecoats as you can easily trip on the bottom.  9. Place the things you use the most on the shelves that are the easiest to reach.  If you use a stepstool, make sure it is in good condition.  If you feel unsteady, DO NOT climb, ask for help. 10. If a health professional advises you to use a cane or walker, do not be ashamed.  These items can keep you from falling and breaking your bones.   *Written instruction for standing gentle shoulder circles clockwise/counterclockwise 5-10 reps, 1-2x/day

## 2020-09-16 NOTE — Therapy (Signed)
[facility] [address] [place], [place], [address] Phone: [phone]   Fax:  [phone]  Physical Therapy Treatment  Patient Details  Name: Benjamin Hahn MRN: [id number] Date of Birth: [birth year] Referring Provider (PT): Berle Mull, MD   Encounter Date: 09/16/2020   PT End of Session - 09/16/20 2143    Visit Number 5    Number of Visits 18    Date for PT Re-Evaluation 10/27/20    Authorization Type Medicare    Authorization Time Period Will need KX starting after eval (due to therapy already this year)    Progress Note Due on Visit 10    PT Start Time 0850    PT Stop Time 0934    PT Time Calculation (min) 44 min    Activity Tolerance Patient tolerated treatment well    Behavior During Therapy [facility] for tasks assessed/performed           Past Medical History:  Diagnosis Date  . Anxiety   . Arthritis    might be in back, no problems  . BPH (benign prostatic hyperplasia)   . Depression 1990s  . GERD (gastroesophageal reflux disease)    occasional  . Hypercholesteremia    controled  . Hypothyroid   . MI (myocardial infarction) ([place])   . Shingles May 2014   "Right face, still has some"  . Temporal arteritis ([place])   . Transient blindness of both eyes     Past Surgical History:  Procedure Laterality Date  . ARTERY BIOPSY Right 06/20/2013   Procedure: BIOPSY TEMPORAL ARTERY RIGHT;  Surgeon: Earnstine Regal, MD;  Location: [facility];  Service: General;  Laterality: Right;  . CARDIAC CATHETERIZATION N/A 09/02/2015   Procedure: Left Heart Cath and Coronary Angiography;  Surgeon: Charolette Forward, MD;  Location: [facility];  Service: Cardiovascular;  Laterality: N/A;  . CARDIAC CATHETERIZATION N/A 09/02/2015   Procedure: Coronary Stent Intervention;  Surgeon: Charolette Forward, MD;  Location: [facility];  Service: Cardiovascular;  Laterality: N/A;  1.  mid RCA      (3.0/28mm Xience) 2.  Mid LAD       (3.0/23mm Xience)  . CRANIOTOMY N/A 04/19/2019   Procedure: CRANIOTOMY HEMATOMA EVACUATION SUBDURAL;  Surgeon: Jovita Gamma, MD;  Location: [facility];  Service: Neurosurgery;  Laterality: N/A;  CRANIOTOMY HEMATOMA EVACUATION SUBDURAL  . None      There were no vitals filed for this visit.   Subjective Assessment - 09/16/20 0854    Subjective My shoulder has been bothering me a lot over the past several weeks.  Have a separated rotator cuff (previous), but it is really acting up.  Had a shot, but that hasn't helped.  No falls.    Patient is accompained by: Family member   spouse in lobby   Patient Stated Goals Patient:  Would like to graduate to cane if I could.  Wife:  want to make sure he's balanced and not falling; the walker is okay with me.    Currently in Pain? Yes    Pain Score 8     Pain Location Shoulder    Pain Orientation Right    Pain Descriptors / Indicators Aching    Pain Type Acute pain    Pain Onset 1 to 4 weeks ago    Pain Frequency Intermittent    Aggravating Factors  lifting    Pain Relieving Factors resting position; Voltaren gel  [facility] Adult PT Treatment/Exercise - 09/16/20 0001      Transfers   Transfers Sit to Stand;Stand to Sit    Sit to Stand 5: Supervision;With upper extremity assist;From chair/3-in-1    Sit to Stand Details (indicate cue type and reason) Pt tends to have posterior lean/retropulsion with attempts at sit<>stand from mat surface when not using UE support    Five time sit to stand comments  15.63    Stand to Sit 5: Supervision;With upper extremity assist;To chair/3-in-1      Standardized Balance Assessment   Standardized Balance Assessment Berg Balance Test;Timed Up and Go Test      Berg Balance Test   Sit to Stand Able to stand  independently using hands    Standing Unsupported Able to stand safely 2 minutes    Sitting with Back Unsupported but Feet Supported on Floor or Stool Able to sit  safely and securely 2 minutes    Stand to Sit Sits safely with minimal use of hands    Transfers Able to transfer with verbal cueing and /or supervision    Standing Unsupported with Eyes Closed Able to stand 10 seconds safely    Standing Ubsupported with Feet Together Able to place feet together independently and stand for 1 minute with supervision    From Standing, Reach Forward with Outstretched Arm Reaches forward but needs supervision    From Standing Position, Pick up Object from [place] to pick up shoe, needs supervision    From Standing Position, Turn to Look Behind Over each Shoulder Looks behind from both sides and weight shifts well    Turn 360 Degrees Needs close supervision or verbal cueing    Standing Unsupported, Alternately Place Feet on Step/Stool Needs assistance to keep from falling or unable to try    Standing Unsupported, One Foot in Front Able to take small step independently and hold 30 seconds    Standing on One Leg Tries to lift leg/unable to hold 3 seconds but remains standing independently   1 sec LLE, <1 sec RLE   Total Score 36      Timed Up and Go Test   TUG Normal TUG    Normal TUG (seconds) 17.81   16.28, 18.50     Self-Care   Self-Care Other Self-Care Comments    Other Self-Care Comments  Discussed progress towards goals, POC and need to add additional appointments to complete POC (due to missed appts for shoulder pain and for inclement weather); discussed fall prevention education and provided handout; discussed that pt remains at fall risk per Merrilee Jansky an TUG scores and continues to need to use rollator for optimal safety in home            Reviewed Exercises from HEP-occasional cues for technique, slowed pace, and posture Standing March with Counter Support - 1 x daily - 5 x weekly - 1 sets - 10 reps - 3 hold Standing Hip Abduction with Counter Support - 1 x daily - 5 x weekly - 1 sets - 10 reps Heel rises with counter support - 1 x daily - 5 x weekly -  3 sets - 10 reps Recumbent Bike - 1 x daily - 3 x weekly - 5-7 minutes time (ddi not review today) Standing Tandem Balance with Counter Support - 1 x daily - 5 x weekly - 1 sets - 3 reps - 30 hold Standing Single Leg Stance with Counter Support - 1 x daily - 5 x weekly -  1 sets - 3 reps - 10 hold Mini Squat with Counter Support - 1 x daily - 5 x weekly - 3 sets - 10 reps   Shoulder rolls, RUE, clockwise and counterclockwise 5 reps, x 2 sets.  Cues for relaxed motion, followed by attention to upright posture, as a means to relax R shoulder and help with posture.     See self-care for additional education  PT Education - 09/16/20 2135    Education Details Educated in fall prevention; also wrote on handout and pt performed gentle shoulder circles, with LUE support, clockwise/counterclockwise 5-10 reps, 1-2 x/day    Person(s) Educated Patient;Child(ren)   daughter, Sharee Pimple           PT Short Term Goals - 09/16/20 2149      PT SHORT TERM GOAL #1   Title Pt will perform HEP with family supervision, for improved strength, balance, and gait.  TARGET 08/30/2020    Time 5    Period Weeks    Status Achieved      PT SHORT TERM GOAL #2   Title Pt will improve TUG score to less than or equal to 15 seconds for decreased fall risk.    Baseline 17.12; 17.81 09/16/20    Time 5    Period Weeks    Status Not Met      PT SHORT TERM GOAL #3   Title Pt will improve Berg Balance score to at least 29/56 for decreased fall risk.    Baseline 24/56; 36/56 09/16/2020    Time 5    Period Weeks    Status Achieved      PT SHORT TERM GOAL #4   Title Pt will improve 5x sit<>stand to less than or equal to 15 seconds for improved functional lower extremity strength.    Baseline 19.13 sec at eval; 5x:  15.63 09/16/2020    Time 5    Period Weeks    Status Partially Met      PT SHORT TERM GOAL #5   Title Pt will verbalize understanding of fall prevention in home envrionment.    Time 5    Period Weeks     Status Achieved             PT Long Term Goals - 09/16/20 2200      PT LONG TERM GOAL #1   Title Pt will perform progression of HEP for improved balance, strength, and gait.  For all LTGs:  TARGET 10/18/2020    Time 9    Period Weeks    Status New      PT LONG TERM GOAL #2   Title Pt will improve TUG score to less than or equal to 13.5 seconds for decreased fall risk.    Baseline 17.12 at eval    Time 9    Period Weeks    Status New      PT LONG TERM GOAL #3   Title Pt will improve Berg score to at least 39/56 for decreased fall risk.    Baseline 24/56 eval    Time 9    Period Weeks    Status New      PT LONG TERM GOAL #4   Title Pt will improve gait velocity to at least 2.62 ft/sec for improved gait efficiency and safety.    Baseline 2.2 ft/sec    Time 9    Period Weeks    Status New      PT LONG TERM GOAL #  5   Title Pt will verbalize/demo understanding of continued community fitness plan to maximize functional gains made in PT.    Time 9    Period Weeks    Status New                 Plan - 09/16/20 2144    Clinical Impression Statement Pt has not been to PT since 08/21/2020, due to cancellations because of shoulder pain and due to inclement weather.  STGs assessed today, with pt meeting 2 of 4 STGs.  STG 1 met for HEP, STG 3 met for improved Berg.  STG 2 not met for TUG score; STG 4 partially met, with improved 5x sit<>stand improved, just not to goal level.  Pt is making slow, steady progress, but remains at fall risk with decreased safety awareness.  He will continue to benefit from skilled PT to further address balance and gait, posture, and strength for overall improved functional mobility.  Please note modified LTG dates, as weeks remain in POC.    Personal Factors and Comorbidities Comorbidity 3+;Time since onset of injury/illness/exacerbation    Comorbidities See PMH    Examination-Activity Limitations Locomotion Level;Transfers;Stand     Examination-Participation Restrictions Community Activity;Other   Community exercise; going out to eat   Stability/Clinical Decision Making Evolving/Moderate complexity    Rehab Potential Good    PT Frequency 2x / week    PT Duration Other (comment)   9 weeks, including eval week   PT Treatment/Interventions ADLs/Self Care Home Management;DME Instruction;Gait training;Stair training;Functional mobility training;Therapeutic activities;Therapeutic exercise;Balance training;Neuromuscular re-education;Patient/family education    PT Next Visit Plan Make sure pt scheduled more appts; check about shoulder circles.  Continue use of machines in clinic (seated stepper, leg press) that pt can be using in the gym he has access to. continue to work on strengthening and balance training, gait with rollator around objects, ramps/curbs as some point.  (Pt requests to go down to 1x/wk due to transportation issues with wife, so will need to make sure he is performing HEP, walking, gym activities at home).    PT Home Exercise Plan Access Code: [email or web address]    Consulted and Agree with Plan of Care Patient           Patient will benefit from skilled therapeutic intervention in order to improve the following deficits and impairments:  Abnormal gait,Difficulty walking,Decreased safety awareness,Decreased balance,Decreased mobility,Decreased strength,Postural dysfunction  Visit Diagnosis: Unsteadiness on feet  Other abnormalities of gait and mobility  Muscle weakness (generalized)  Abnormal posture     Problem List Patient Active Problem List   Diagnosis Date Noted  . Routine general medical examination at a health care facility 12/09/2019  . Hypotension 12/07/2019  . Psychophysiological insomnia 12/07/2019  . Deficiency anemia 06/01/2019  . Vitamin D deficiency 06/01/2019  . Chronic idiopathic constipation 06/01/2019  . Hyperglycemia 04/15/2019  . Mild intermittent asthma with acute exacerbation  02/08/2018  . Hypothyroidism 03/14/2013  . Hyperlipidemia LDL goal <70 03/14/2013  . BPH (benign prostatic hypertrophy) 03/14/2013  . Hypogonadism, male 03/14/2013  . Erectile dysfunction 03/14/2013    Frazier Butt. 09/16/2020, 10:04 PM  Frazier Butt., PT   [facility] [address] [place], [place], [address] Phone: [phone]   Fax:  [phone]  Name: Benjamin Hahn MRN: [id number] Date of Birth: September 27, 1932

## 2020-09-18 ENCOUNTER — Ambulatory Visit: Payer: BLUE CROSS/BLUE SHIELD | Admitting: Internal Medicine

## 2020-09-18 ENCOUNTER — Ambulatory Visit: Payer: BLUE CROSS/BLUE SHIELD | Admitting: Physical Therapy

## 2020-09-25 ENCOUNTER — Ambulatory Visit: Payer: BLUE CROSS/BLUE SHIELD | Admitting: Internal Medicine

## 2020-09-25 ENCOUNTER — Ambulatory Visit: Payer: Medicare Other | Admitting: Physical Therapy

## 2020-09-30 ENCOUNTER — Other Ambulatory Visit: Payer: Self-pay

## 2020-09-30 ENCOUNTER — Ambulatory Visit: Payer: Medicare Other | Attending: Internal Medicine | Admitting: Physical Therapy

## 2020-09-30 ENCOUNTER — Encounter: Payer: Self-pay | Admitting: Physical Therapy

## 2020-09-30 DIAGNOSIS — R2681 Unsteadiness on feet: Secondary | ICD-10-CM

## 2020-09-30 DIAGNOSIS — M6281 Muscle weakness (generalized): Secondary | ICD-10-CM | POA: Diagnosis not present

## 2020-09-30 DIAGNOSIS — R2689 Other abnormalities of gait and mobility: Secondary | ICD-10-CM | POA: Diagnosis not present

## 2020-09-30 NOTE — Therapy (Signed)
[facility] [address] [place], [place], [address] Phone: [phone]   Fax:  [phone]  Physical Therapy Treatment/PROGRESS NOTE/RECERT  Patient Details  Name: Benjamin Hahn MRN: [id number] Date of Birth: 27-Aug-1932 Referring Provider (PT): Berle Mull, MD   Encounter Date: 09/30/2020   PT End of Session - 09/30/20 2017    Visit Number 6    Number of Visits 18    Date for PT Re-Evaluation [id number]   for recert completed [id number]   Authorization Type Medicare    Authorization Time Period --    Progress Note Due on Visit --   completed at visit 6, due to recert and 60 days since eval   PT Start Time 0851    PT Stop Time 0930    PT Time Calculation (min) 39 min    Activity Tolerance Patient tolerated treatment well    Behavior During Therapy [facility] for tasks assessed/performed           Past Medical History:  Diagnosis Date  . Anxiety   . Arthritis    might be in back, no problems  . BPH (benign prostatic hyperplasia)   . Depression 1990s  . GERD (gastroesophageal reflux disease)    occasional  . Hypercholesteremia    controled  . Hypothyroid   . MI (myocardial infarction) ([place])   . Shingles May 2014   "Right face, still has some"  . Temporal arteritis ([place])   . Transient blindness of both eyes     Past Surgical History:  Procedure Laterality Date  . ARTERY BIOPSY Right 06/20/2013   Procedure: BIOPSY TEMPORAL ARTERY RIGHT;  Surgeon: Earnstine Regal, MD;  Location: [facility];  Service: General;  Laterality: Right;  . CARDIAC CATHETERIZATION N/A 09/02/2015   Procedure: Left Heart Cath and Coronary Angiography;  Surgeon: Charolette Forward, MD;  Location: [facility];  Service: Cardiovascular;  Laterality: N/A;  . CARDIAC CATHETERIZATION N/A 09/02/2015   Procedure: Coronary Stent Intervention;  Surgeon: Charolette Forward, MD;  Location: [facility];  Service: Cardiovascular;  Laterality: N/A;  1.  mid  RCA      (3.0/28mm Xience) 2.  Mid LAD      (3.0/23mm Xience)  . CRANIOTOMY N/A 04/19/2019   Procedure: CRANIOTOMY HEMATOMA EVACUATION SUBDURAL;  Surgeon: Jovita Gamma, MD;  Location: [facility];  Service: Neurosurgery;  Laterality: N/A;  CRANIOTOMY HEMATOMA EVACUATION SUBDURAL  . None      There were no vitals filed for this visit.   Subjective Assessment - 09/30/20 0854    Subjective No changes, no falls.  Been trying to work on my exercises and I feel I'm stronger.  Been trying to walk by myself some.    Patient is accompained by: Family member   spouse in lobby   Patient Stated Goals Patient:  Would like to graduate to cane if I could.  Wife:  want to make sure he's balanced and not falling; the walker is okay with me.    Currently in Pain? Yes    Pain Score 0-No pain   certain movements aggravate   Pain Location Shoulder                             [facility] Adult PT Treatment/Exercise - 09/30/20 0001      Self-Care   Self-Care Other Self-Care Comments    Other Self-Care Comments  Wife present at end of session,  as pt feels he is ready to finish PT today (pt has only been seen twice since beginning of January due to their scheduling conflicts and cancellation from PT last week due to PT unavailable).  Discussed importance of consistency of therapy sessions to make appreciable changes in balance and pt agrees to schedule 3 additional visits to complete POC.  Discussed safety with gait and reminded pt he needs rollator due to his high fall risk and should not go without rollator.      Knee/Hip Exercises: Aerobic   Other Aerobic Scifit UE/LEs on level 2.5 for 8 minutes, with RPM >/=45- 50 for strengthening and activity tolerance.  Cues for long strides on Scifit, as pt tends to go to short, fast steps, which ultimately decrease his speed.  Discussed this as a means to help carryover longer RLE step length and decreased shuffling with gait.            Neuro  Re-education:  Perofrmed the following exercises at counter, with cues for technique.  10 reps each, then with printed HEP, performed additional 10 reps with visual cues of handout and PT VCs.  At end of session, PT briefly went over exercises with wife.  Step forward with counter support - 1 x daily - 5 x weekly - 1-2 sets - 10 reps   -Standing beside counter, forward step and weightshift, then return to midline.  Cues for foot clearance and heelstrike Side Stepping with Counter Support - 1 x daily - 5 x weekly - 1-2 sets - 10 reps  -Facing counter, side step and weightshift, then return to midline.  Cues for foot clearance and for adequate weightshift. Staggered Stance Forward Backward Weight Shift with Counter Support - 1 x daily - 5 x weekly - 1-2 sets - 10 reps  -facing counter, cues for improved weightshifting/excursion anterior/posterior through hips.  Cues to slow pace        PT Education - 09/30/20 2017    Education Details Updates to HEP-see instructions; see also self-care for discussion on POC    Person(s) Educated Patient;Spouse    Methods Explanation;Demonstration;Handout;Verbal cues    Comprehension Verbalized understanding;Returned demonstration;Verbal cues required;Need further instruction            PT Short Term Goals - 09/30/20 2022      PT SHORT TERM GOAL #1   Title ---             PT Long Term Goals - 09/30/20 2020      PT LONG TERM GOAL #1   Title Pt will perform progression of HEP for improved balance, strength, and gait.  For all LTGs:  TARGET 11/01/2020    Time 4    Period Weeks    Status On-going      PT LONG TERM GOAL #2   Title Pt will improve TUG score to less than or equal to 13.5 seconds for decreased fall risk.    Baseline 17.12 at eval, 17.81 09/16/20    Time 4    Period Weeks    Status On-going      PT LONG TERM GOAL #3   Title Pt will improve Berg score to at least 41/56 for decreased fall risk.    Baseline 24/56 eval, 36/56 09/16/20     Time 4    Period Weeks    Status Revised      PT LONG TERM GOAL #4   Title Pt will improve gait velocity to at least 2.62  ft/sec for improved gait efficiency and safety.    Baseline 2.2 ft/sec    Time 4    Period Weeks    Status On-going      PT LONG TERM GOAL #5   Title Pt will verbalize/demo understanding of continued community fitness plan to maximize functional gains made in PT.    Time 4    Period Weeks    Status On-going                 Plan - 09/30/20 2024    Clinical Impression Statement Skilled PT session today focused on lower extremity strengthening and balance exercises, with additions provided to HEP.  Pt feels he is ready for d/c and for walking without rollator; however, in discussion with pt and wife, discussed importance of consistency of therapy sessions and continuing to work in therapy sessions and use rollator for safety.  Pt in agreement to conitnue therapy.  Pt demo decreased safety awareness overall, and needs cues for reminders for slowed pace of exercises and needs frequent cues for RLE foot clearance with gait.  Please note (6)th visit Progress report, covering dates 07/29/2020-09/30/2020.  Recent objective measures:  5x sit<>stand 15.63, Berg 36/56 (improved from 24/56), TUG score 17.81 sec with rollator.  STGs checked last visit, with pt meeting 2 of 4 STGs.  LTGs revised and continued, with recert completed this visit as well.  Pt will continue to benefit from skilled PT to further address balance and strength for improved overall functional mobility and decreased fall risk.    Personal Factors and Comorbidities Comorbidity 3+;Time since onset of injury/illness/exacerbation    Comorbidities See PMH    Examination-Activity Limitations Locomotion Level;Transfers;Stand    Examination-Participation Restrictions Community Activity;Other   Community exercise; going out to eat   Stability/Clinical Decision Making Evolving/Moderate complexity    Rehab Potential  Good    PT Frequency 1x / week    PT Duration 4 weeks   recert 12/29/8467   PT Treatment/Interventions ADLs/Self Care Home Management;DME Instruction;Gait training;Stair training;Functional mobility training;Therapeutic activities;Therapeutic exercise;Balance training;Neuromuscular re-education;Patient/family education    PT Next Visit Plan Review updates to HEP added this visit; pt has agreed to 3 additional visits; need to focus on balance, functional strength/transfers, use of machines; work towards [place].    PT Home Exercise Plan Access Code: [email or web address]    Consulted and Agree with Plan of Care Patient;Family member/caregiver    Family Member Consulted wife at end of session           Patient will benefit from skilled therapeutic intervention in order to improve the following deficits and impairments:  Abnormal gait,Difficulty walking,Decreased safety awareness,Decreased balance,Decreased mobility,Decreased strength,Postural dysfunction  Visit Diagnosis: Unsteadiness on feet  Muscle weakness (generalized)  Other abnormalities of gait and mobility     Problem List Patient Active Problem List   Diagnosis Date Noted  . Routine general medical examination at a health care facility 12/09/2019  . Hypotension 12/07/2019  . Psychophysiological insomnia 12/07/2019  . Deficiency anemia 06/01/2019  . Vitamin D deficiency 06/01/2019  . Chronic idiopathic constipation 06/01/2019  . Hyperglycemia 04/15/2019  . Mild intermittent asthma with acute exacerbation 02/08/2018  . Hypothyroidism 03/14/2013  . Hyperlipidemia LDL goal <70 03/14/2013  . BPH (benign prostatic hypertrophy) 03/14/2013  . Hypogonadism, male 03/14/2013  . Erectile dysfunction 03/14/2013    Frazier Butt. 09/30/2020, 8:32 PM Frazier Butt., PT  [facility] [address] [place], [place], [address] Phone: [phone]  Fax:  [phone]  Name: Benjamin Hahn MRN: [id number] Date of Birth: [birth year]

## 2020-09-30 NOTE — Patient Instructions (Signed)
Access Code: [email or web address] URL: [email or web address] Date: 09/30/2020 Prepared by: Jennalee Greaves  Exercises Standing March with Counter Support - 1 x daily - 5 x weekly - 1 sets - 10 reps - 3 hold Standing Hip Abduction with Counter Support - 1 x daily - 5 x weekly - 1 sets - 10 reps Heel rises with counter support - 1 x daily - 5 x weekly - 3 sets - 10 reps Recumbent Bike - 1 x daily - 3 x weekly - 5-7 minutes time Standing Tandem Balance with Counter Support - 1 x daily - 5 x weekly - 1 sets - 3 reps - 30 hold Standing Single Leg Stance with Counter Support - 1 x daily - 5 x weekly - 1 sets - 3 reps - 10 hold Mini Squat with Counter Support - 1 x daily - 5 x weekly - 3 sets - 10 reps Step forward with counter support - 1 x daily - 5 x weekly - 1-2 sets - 10 reps Side Stepping with Counter Support - 1 x daily - 5 x weekly - 1-2 sets - 10 reps Staggered Stance Forward Backward Weight Shift with Counter Support - 1 x daily - 5 x weekly - 1-2 sets - 10 reps  

## 2020-10-02 ENCOUNTER — Other Ambulatory Visit: Payer: Self-pay

## 2020-10-02 ENCOUNTER — Other Ambulatory Visit (HOSPITAL_COMMUNITY): Payer: Self-pay | Admitting: Psychiatry

## 2020-10-02 ENCOUNTER — Ambulatory Visit (INDEPENDENT_AMBULATORY_CARE_PROVIDER_SITE_OTHER): Payer: Medicare Other | Admitting: Internal Medicine

## 2020-10-02 ENCOUNTER — Encounter: Payer: Self-pay | Admitting: Internal Medicine

## 2020-10-02 ENCOUNTER — Other Ambulatory Visit (HOSPITAL_COMMUNITY): Payer: Self-pay | Admitting: *Deleted

## 2020-10-02 VITALS — BP 118/62 | HR 52 | Temp 97.8°F | Resp 18 | Ht 67.5 in | Wt 186.2 lb

## 2020-10-02 DIAGNOSIS — R739 Hyperglycemia, unspecified: Secondary | ICD-10-CM | POA: Diagnosis not present

## 2020-10-02 DIAGNOSIS — E559 Vitamin D deficiency, unspecified: Secondary | ICD-10-CM

## 2020-10-02 DIAGNOSIS — E785 Hyperlipidemia, unspecified: Secondary | ICD-10-CM

## 2020-10-02 DIAGNOSIS — D539 Nutritional anemia, unspecified: Secondary | ICD-10-CM

## 2020-10-02 DIAGNOSIS — R001 Bradycardia, unspecified: Secondary | ICD-10-CM | POA: Diagnosis not present

## 2020-10-02 DIAGNOSIS — E039 Hypothyroidism, unspecified: Secondary | ICD-10-CM | POA: Diagnosis not present

## 2020-10-02 LAB — VITAMIN B12: Vitamin B-12: 329 pg/mL (ref 211–911)

## 2020-10-02 LAB — CBC WITH DIFFERENTIAL/PLATELET
Basophils Absolute: 0 10*3/uL (ref 0.0–0.1)
Basophils Relative: 0.6 % (ref 0.0–3.0)
Eosinophils Absolute: 0.1 10*3/uL (ref 0.0–0.7)
Eosinophils Relative: 1.9 % (ref 0.0–5.0)
HCT: 34.5 % — ABNORMAL LOW (ref 39.0–52.0)
Hemoglobin: 11.7 g/dL — ABNORMAL LOW (ref 13.0–17.0)
Lymphocytes Relative: 33.6 % (ref 12.0–46.0)
Lymphs Abs: 2.5 10*3/uL (ref 0.7–4.0)
MCHC: 34 g/dL (ref 30.0–36.0)
MCV: 91.5 fl (ref 78.0–100.0)
Monocytes Absolute: 0.7 10*3/uL (ref 0.1–1.0)
Monocytes Relative: 9.7 % (ref 3.0–12.0)
Neutro Abs: 4.1 10*3/uL (ref 1.4–7.7)
Neutrophils Relative %: 54.2 % (ref 43.0–77.0)
Platelets: 233 10*3/uL (ref 150.0–400.0)
RBC: 3.77 Mil/uL — ABNORMAL LOW (ref 4.22–5.81)
RDW: 13.9 % (ref 11.5–15.5)
WBC: 7.5 10*3/uL (ref 4.0–10.5)

## 2020-10-02 LAB — FERRITIN: Ferritin: 166.7 ng/mL (ref 22.0–322.0)

## 2020-10-02 LAB — HEPATIC FUNCTION PANEL
ALT: 23 U/L (ref 0–53)
AST: 20 U/L (ref 0–37)
Albumin: 4.1 g/dL (ref 3.5–5.2)
Alkaline Phosphatase: 50 U/L (ref 39–117)
Bilirubin, Direct: 0.1 mg/dL (ref 0.0–0.3)
Total Bilirubin: 0.5 mg/dL (ref 0.2–1.2)
Total Protein: 6.7 g/dL (ref 6.0–8.3)

## 2020-10-02 LAB — LIPID PANEL
Cholesterol: 147 mg/dL (ref 0–200)
HDL: 79.3 mg/dL (ref >39.00)
LDL Cholesterol: 57 mg/dL (ref 0–99)
NonHDL: 68.07
Total CHOL/HDL Ratio: 2
Triglycerides: 55 mg/dL (ref 0.0–149.0)
VLDL: 11 mg/dL (ref 0.0–40.0)

## 2020-10-02 LAB — BASIC METABOLIC PANEL
BUN: 22 mg/dL (ref 6–23)
CO2: 30 mEq/L (ref 19–32)
Calcium: 9.2 mg/dL (ref 8.4–10.5)
Chloride: 103 mEq/L (ref 96–112)
Creatinine, Ser: 1.24 mg/dL (ref 0.40–1.50)
GFR: 52.96 mL/min — ABNORMAL LOW (ref >60.00)
Glucose, Bld: 71 mg/dL (ref 70–99)
Potassium: 4.2 mEq/L (ref 3.5–5.1)
Sodium: 140 mEq/L (ref 135–145)

## 2020-10-02 LAB — TSH: TSH: 2.24 u[IU]/mL (ref 0.35–4.50)

## 2020-10-02 LAB — FOLATE: Folate: 17.7 ng/mL (ref >5.9)

## 2020-10-02 LAB — IRON: Iron: 78 ug/dL (ref 42–165)

## 2020-10-02 MED ORDER — BUPROPION HCL ER (XL) 300 MG PO TB24: 300.0000 mg | ORAL_TABLET | Freq: Every day | ORAL | 2 refills | Status: DC

## 2020-10-02 MED ORDER — VENLAFAXINE HCL ER 37.5 MG PO CP24: 37.5000 mg | ORAL_CAPSULE | Freq: Every day | ORAL | 6 refills | Status: DC

## 2020-10-02 MED ORDER — BUPROPION HCL ER (SR) 100 MG PO TB12: ORAL_TABLET | ORAL | 3 refills | Status: DC

## 2020-10-02 NOTE — Patient Instructions (Signed)
[employer] medicine (25th ed., pp. [phone]). [place], [place]: Elsevier.">  Anemia  Anemia is a condition in which there is not enough red blood cells or hemoglobin in the blood. Hemoglobin is a substance in red blood cells that carries oxygen. When you do not have enough red blood cells or hemoglobin (are anemic), your body cannot get enough oxygen and your organs may not work properly. As a result, you may feel very tired or have other problems. What are the causes? Common causes of anemia include:  Excessive bleeding. Anemia can be caused by excessive bleeding inside or outside the body, including bleeding from the intestines or from heavy menstrual periods in females.  Poor nutrition.  Long-lasting (chronic) kidney, thyroid, and liver disease.  Bone marrow disorders, spleen problems, and blood disorders.  Cancer and treatments for cancer.  HIV (human immunodeficiency virus) and AIDS (acquired immunodeficiency syndrome).  Infections, medicines, and autoimmune disorders that destroy red blood cells. What are the signs or symptoms? Symptoms of this condition include:  Minor weakness.  Dizziness.  Headache, or difficulties concentrating and sleeping.  Heartbeats that feel irregular or faster than normal (palpitations).  Shortness of breath, especially with exercise.  Pale skin, lips, and nails, or cold hands and feet.  Indigestion and nausea. Symptoms may occur suddenly or develop slowly. If your anemia is mild, you may not have symptoms. How is this diagnosed? This condition is diagnosed based on blood tests, your medical history, and a physical exam. In some cases, a test may be needed in which cells are removed from the soft tissue inside of a bone and looked at under a microscope (bone marrow biopsy). Your health care provider may also check your stool (feces) for blood and may do additional testing to look for the cause of your bleeding. Other tests may  include:  Imaging tests, such as a CT scan or MRI.  A procedure to see inside your esophagus and stomach (endoscopy).  A procedure to see inside your colon and rectum (colonoscopy). How is this treated? Treatment for this condition depends on the cause. If you continue to lose a lot of blood, you may need to be treated at a hospital. Treatment may include:  Taking supplements of iron, vitamin Q68, or folic acid.  Taking a hormone medicine (erythropoietin) that can help to stimulate red blood cell growth.  Having a blood transfusion. This may be needed if you lose a lot of blood.  Making changes to your diet.  Having surgery to remove your spleen. Follow these instructions at home:  Take over-the-counter and prescription medicines only as told by your health care provider.  Take supplements only as told by your health care provider.  Follow any diet instructions that you were given by your health care provider.  Keep all follow-up visits as told by your health care provider. This is important. Contact a health care provider if:  You develop new bleeding anywhere in the body. Get help right away if:  You are very weak.  You are short of breath.  You have pain in your abdomen or chest.  You are dizzy or feel faint.  You have trouble concentrating.  You have bloody stools, black stools, or tarry stools.  You vomit repeatedly or you vomit up blood. These symptoms may represent a serious problem that is an emergency. Do not wait to see if the symptoms will go away. Get medical help right away. Call your local emergency services (911 in the [place] Do not  drive yourself to the hospital. Summary  Anemia is a condition in which you do not have enough red blood cells or enough of a substance in your red blood cells that carries oxygen (hemoglobin).  Symptoms may occur suddenly or develop slowly.  If your anemia is mild, you may not have symptoms.  This condition is  diagnosed with blood tests, a medical history, and a physical exam. Other tests may be needed.  Treatment for this condition depends on the cause of the anemia. This information is not intended to replace advice given to you by your health care provider. Make sure you discuss any questions you have with your health care provider. Document Revised: 07/18/2019 Document Reviewed: 07/18/2019 Elsevier Patient Education  2021 [employer].  

## 2020-10-02 NOTE — Progress Notes (Signed)
Subjective:  Patient ID: Benjamin Hahn, male    DOB: Jun 21, 1933  Age: 87 y.o. MRN: [id number]  CC: Hypertension and Hypothyroidism  This visit occurred during the SARS-CoV-2 public health emergency.  Safety protocols were in place, including screening questions prior to the visit, additional usage of staff PPE, and extensive cleaning of exam room while observing appropriate contact time as indicated for disinfecting solutions.    HPI Benjamin Hahn presents for f/up -  He feels like his thyroid dosage is adequate.  He denies changes in his bowel habits, sleep pattern, weight, or energy level.  He denies palpitations, dizziness, lightheadedness, or near syncope.  Outpatient Medications Prior to Visit  Medication Sig Dispense Refill  . acetaminophen (TYLENOL) 325 MG tablet Take 1-2 tablets (325-650 mg total) by mouth every 4 (four) hours as needed for mild pain or headache.    [name] acetaminophen (TYLENOL) 325 MG tablet Take 325-650 mg by mouth every 6 (six) hours as needed for mild pain or headache.    . ALPRAZolam (XANAX) 0.5 MG tablet Half q day  prn 25 tablet 3  . atorvastatin (LIPITOR) 40 MG tablet Take 1 tablet (40 mg total) by mouth daily at 6 PM. 30 tablet 3  . HYDROcodone-acetaminophen (NORCO) 10-325 MG tablet     . hydrOXYzine (ATARAX/VISTARIL) 25 MG tablet Take 1 tablet (25 mg total) by mouth at bedtime. 30 tablet 1  . lidocaine (LIDODERM) 5 % Place 1 patch onto the skin daily. Remove & Discard patch within 12 hours or as directed by MD 30 patch 0  . LORazepam (ATIVAN) 0.5 MG tablet Take 0.5 mg by mouth at bedtime.    . ramipril (ALTACE) 1.25 MG capsule Take 1.25 mg by mouth daily after supper.    [name] buPROPion (WELLBUTRIN XL) 300 MG 24 hr tablet Take 1 tablet (300 mg total) by mouth daily. 90 tablet 2  . levothyroxine (SYNTHROID) 88 MCG tablet TAKE 1 TABLET BY MOUTH DAILY. 30 tablet 0  . temazepam (RESTORIL) 15 MG capsule Take 1 capsule (15 mg total) by mouth at bedtime as  needed for sleep. 30 capsule 0  . temazepam (RESTORIL) 15 MG capsule Take 1 capsule (15 mg total) by mouth at bedtime as needed for sleep. 30 capsule 4  . venlafaxine XR (EFFEXOR-XR) 75 MG 24 hr capsule Take 1 capsule (75 mg total) by mouth daily. 30 capsule 6  . buPROPion (WELLBUTRIN) 100 MG tablet Take 100 mg by mouth daily.    . metoprolol succinate (TOPROL-XL) 25 MG 24 hr tablet TAKE (1/2) TABLET DAILY. 15 tablet 0  . Plecanatide (TRULANCE) 3 MG TABS Take 1 tablet by mouth daily. (Patient not taking: Reported on 10/02/2020) 90 tablet 1   No facility-administered medications prior to visit.    ROS Review of Systems  Constitutional: Negative for diaphoresis and fatigue.  HENT: Negative.   Eyes: Negative.   Respiratory: Negative for cough, chest tightness, shortness of breath and wheezing.   Cardiovascular: Negative for chest pain, palpitations and leg swelling.  Gastrointestinal: Negative for abdominal pain, constipation, diarrhea and vomiting.  Endocrine: Negative.  Negative for cold intolerance and heat intolerance.  Genitourinary: Negative.  Negative for difficulty urinating.  Musculoskeletal: Positive for arthralgias. Negative for myalgias.  Skin: Negative.  Negative for color change.  Neurological: Negative.  Negative for dizziness and weakness.  Hematological: Negative for adenopathy. Does not bruise/bleed easily.  Psychiatric/Behavioral: Negative.     Objective:  BP 118/62   Pulse (!) 52  Temp 97.8 F (36.6 C) (Oral)   Resp 18   Ht 5' 7.5" (1.715 m)   Wt 186 lb 3.2 oz (84.5 kg)   SpO2 97%   BMI 28.73 kg/m   BP Readings from Last 3 Encounters:  10/02/20 118/62  12/07/19 (!) 98/58  10/19/19 126/60    Wt Readings from Last 3 Encounters:  10/02/20 186 lb 3.2 oz (84.5 kg)  12/07/19 180 lb (81.6 kg)  10/19/19 182 lb (82.6 kg)    Physical Exam Vitals reviewed.  HENT:     Nose: Nose normal.     Mouth/Throat:     Mouth: Mucous membranes are moist.  Eyes:      General: No scleral icterus.    Conjunctiva/sclera: Conjunctivae normal.  Cardiovascular:     Rate and Rhythm: Bradycardia present. Frequent extrasystoles are present.    Comments: He refused to allow me to do an EKG today Pulmonary:     Effort: Pulmonary effort is normal.     Breath sounds: No stridor. No wheezing, rhonchi or rales.  Abdominal:     General: Abdomen is flat.     Palpations: There is no mass.     Tenderness: There is no abdominal tenderness. There is no guarding.  Musculoskeletal:        General: No deformity. Normal range of motion.     Cervical back: Neck supple.     Right lower leg: Edema (trace pitting) present.     Left lower leg: Edema (trace pitting) present.  Lymphadenopathy:     Cervical: No cervical adenopathy.  Skin:    General: Skin is warm and dry.  Neurological:     General: No focal deficit present.     Lab Results  Component Value Date   WBC 7.5 10/02/2020   HGB 11.7 (L) 10/02/2020   HCT 34.5 (L) 10/02/2020   PLT 233.0 10/02/2020   GLUCOSE 71 10/02/2020   CHOL 147 10/02/2020   TRIG 55.0 10/02/2020   HDL 79.30 10/02/2020   LDLCALC 57 10/02/2020   ALT 23 10/02/2020   AST 20 10/02/2020   NA 140 10/02/2020   K 4.2 10/02/2020   CL 103 10/02/2020   CREATININE 1.24 10/02/2020   BUN 22 10/02/2020   CO2 30 10/02/2020   TSH 2.24 10/02/2020   INR 1.1 10/11/2019   HGBA1C 5.5 04/14/2019    CT Head Wo Contrast  Result Date: 10/11/2019 CLINICAL DATA:  87 year old who fell off of a curb while at a shopping center earlier today with loss of consciousness and laceration to the LEFT eyebrow. Patient is amnestic to the event. Initial encounter. Personal history of subdural hematoma. EXAM: CT HEAD WITHOUT CONTRAST CT CERVICAL SPINE WITHOUT CONTRAST TECHNIQUE: Multidetector CT imaging of the head and cervical spine was performed following the standard protocol without intravenous contrast. Multiplanar CT image reconstructions of the cervical spine were  also generated. COMPARISON:  05/31/2019 and earlier. FINDINGS: CT HEAD FINDINGS Brain: Moderate cortical, deep and cerebellar atrophy as noted previously. No mass lesion. No midline shift. No acute hemorrhage or hematoma. No extra-axial fluid collections. No evidence of acute infarction. Dural thickening beneath the RIGHT frontal craniotomy flap. Vascular: Moderate to severe BILATERAL carotid siphon and mild BILATERAL vertebral artery atherosclerosis. No hyperdense vessel. Skull: Prior RIGHT frontal craniotomy. No skull fracture or other focal osseous abnormality involving the skull. Sinuses/Orbits: Preseptal soft tissue swelling/hematoma anterior to the LEFT orbit. No evidence of intraorbital hemorrhage. Other: None. CT CERVICAL SPINE FINDINGS Alignment: Anatomic posterior alignment.  Straightening of the usual cervical lordosis. Facet joints anatomically aligned throughout with severe diffuse degenerative changes. Skull base and vertebrae: No fractures identified involving the cervical spine. Coronal reformatted images demonstrate an intact craniocervical junction, intact dens and intact lateral masses throughout. Degenerative changes at the C1-C2 articulation with calcified pannus POSTERIOR to the dens. Soft tissues and spinal canal: No evidence of paraspinous or spinal canal hematoma. No evidence of spinal stenosis. Disc levels: Severe disc space narrowing and associated endplate hypertrophic changes at C6-7. Moderate disc space narrowing at C5-6 and C7-T1. Calcification within the C2-3 disc. Combination of facet and uncinate hypertrophy account for multilevel foraminal stenoses including severe BILATERAL C3-4, moderate BILATERAL C4-5, severe BILATERAL C5-6, severe BILATERAL C6-7. Upper chest: Visualized lung apices clear. Mild atherosclerosis involving the visualized proximal great vessels. Other: DISH involving the visualized UPPER thoracic spine. BILATERAL cervical carotid atherosclerosis. IMPRESSION: 1. No  acute intracranial abnormality. 2. Moderate generalized atrophy. 3. No cervical spine fractures identified. 4. Multilevel degenerative disc disease, spondylosis and foraminal stenoses throughout the cervical spine as detailed above. 5. Preseptal soft tissue swelling/hematoma anterior to the LEFT orbit without evidence of intraorbital hemorrhage. Electronically Signed   By: Evangeline Dakin M.D.   On: 10/11/2019 18:10   CT Cervical Spine Wo Contrast  Result Date: 10/11/2019 CLINICAL DATA:  87 year old who fell off of a curb while at a shopping center earlier today with loss of consciousness and laceration to the LEFT eyebrow. Patient is amnestic to the event. Initial encounter. Personal history of subdural hematoma. EXAM: CT HEAD WITHOUT CONTRAST CT CERVICAL SPINE WITHOUT CONTRAST TECHNIQUE: Multidetector CT imaging of the head and cervical spine was performed following the standard protocol without intravenous contrast. Multiplanar CT image reconstructions of the cervical spine were also generated. COMPARISON:  05/31/2019 and earlier. FINDINGS: CT HEAD FINDINGS Brain: Moderate cortical, deep and cerebellar atrophy as noted previously. No mass lesion. No midline shift. No acute hemorrhage or hematoma. No extra-axial fluid collections. No evidence of acute infarction. Dural thickening beneath the RIGHT frontal craniotomy flap. Vascular: Moderate to severe BILATERAL carotid siphon and mild BILATERAL vertebral artery atherosclerosis. No hyperdense vessel. Skull: Prior RIGHT frontal craniotomy. No skull fracture or other focal osseous abnormality involving the skull. Sinuses/Orbits: Preseptal soft tissue swelling/hematoma anterior to the LEFT orbit. No evidence of intraorbital hemorrhage. Other: None. CT CERVICAL SPINE FINDINGS Alignment: Anatomic posterior alignment. Straightening of the usual cervical lordosis. Facet joints anatomically aligned throughout with severe diffuse degenerative changes. Skull base and  vertebrae: No fractures identified involving the cervical spine. Coronal reformatted images demonstrate an intact craniocervical junction, intact dens and intact lateral masses throughout. Degenerative changes at the C1-C2 articulation with calcified pannus POSTERIOR to the dens. Soft tissues and spinal canal: No evidence of paraspinous or spinal canal hematoma. No evidence of spinal stenosis. Disc levels: Severe disc space narrowing and associated endplate hypertrophic changes at C6-7. Moderate disc space narrowing at C5-6 and C7-T1. Calcification within the C2-3 disc. Combination of facet and uncinate hypertrophy account for multilevel foraminal stenoses including severe BILATERAL C3-4, moderate BILATERAL C4-5, severe BILATERAL C5-6, severe BILATERAL C6-7. Upper chest: Visualized lung apices clear. Mild atherosclerosis involving the visualized proximal great vessels. Other: DISH involving the visualized UPPER thoracic spine. BILATERAL cervical carotid atherosclerosis. IMPRESSION: 1. No acute intracranial abnormality. 2. Moderate generalized atrophy. 3. No cervical spine fractures identified. 4. Multilevel degenerative disc disease, spondylosis and foraminal stenoses throughout the cervical spine as detailed above. 5. Preseptal soft tissue swelling/hematoma anterior to the LEFT orbit without evidence  of intraorbital hemorrhage. Electronically Signed   By: Evangeline Dakin M.D.   On: 10/11/2019 18:10    Assessment & Plan:   Gilverto was seen today for hypertension and hypothyroidism.  Diagnoses and all orders for this visit:  Acquired hypothyroidism- His TSH is in the normal range.  He will stay on the current dose of T4. -     Cancel: TSH; Future -     levothyroxine (SYNTHROID) 88 MCG tablet; Take 1 tablet (88 mcg total) by mouth daily.  Deficiency anemia- He remains anemic.  I will screen him for vitamin deficiencies. -     Cancel: CBC with Differential/Platelet; Future -     CBC with  Differential/Platelet; Future -     Vitamin B12; Future -     Iron; Future -     Vitamin B1; Future -     Folate; Future -     Ferritin; Future -     Ferritin -     Folate -     Vitamin B1 -     Iron -     Vitamin B12 -     CBC with Differential/Platelet  Hyperlipidemia LDL goal <70- He has achieved his LDL goal is doing well on the statin. -     Lipid panel; Future -     TSH; Future -     Hepatic function panel; Future -     Hepatic function panel -     TSH -     Lipid panel  Vitamin D deficiency  Hyperglycemia -     Basic metabolic panel; Future -     Basic metabolic panel  Bradycardia -     Cancel: Ambulatory referral to Cardiology -     Ambulatory referral to Cardiology   I have discontinued Cristopher Estimable. Buikema's Trulance, metoprolol succinate, and buPROPion. I have also changed his levothyroxine. Additionally, I am having him maintain his acetaminophen, atorvastatin, lidocaine, ramipril, acetaminophen, HYDROcodone-acetaminophen, hydrOXYzine, ALPRAZolam, and LORazepam.  Meds ordered this encounter  Medications  . levothyroxine (SYNTHROID) 88 MCG tablet    Sig: Take 1 tablet (88 mcg total) by mouth daily.    Dispense:  90 tablet    Refill:  1     Follow-up: Return in about 3 months (around 12/30/2020).  Scarlette Calico, MD

## 2020-10-03 ENCOUNTER — Telehealth (HOSPITAL_COMMUNITY): Payer: Self-pay | Admitting: *Deleted

## 2020-10-03 MED ORDER — LEVOTHYROXINE SODIUM 88 MCG PO TABS: 88.0000 ug | ORAL_TABLET | Freq: Every day | ORAL | 1 refills | Status: DC

## 2020-10-03 NOTE — Telephone Encounter (Signed)
Writer spoke with pt advising him to take two Ativan 0.5mg  (1 mg total ) qhs per verbal order from Dr. Casimiro Needle. Med ed reinforced. Pt verbalizes understanding.

## 2020-10-07 ENCOUNTER — Other Ambulatory Visit: Payer: Self-pay

## 2020-10-07 ENCOUNTER — Ambulatory Visit: Payer: Medicare Other | Admitting: Physical Therapy

## 2020-10-07 VITALS — BP 122/64

## 2020-10-07 DIAGNOSIS — R2681 Unsteadiness on feet: Secondary | ICD-10-CM | POA: Diagnosis not present

## 2020-10-07 DIAGNOSIS — R2689 Other abnormalities of gait and mobility: Secondary | ICD-10-CM | POA: Diagnosis not present

## 2020-10-07 DIAGNOSIS — M6281 Muscle weakness (generalized): Secondary | ICD-10-CM | POA: Diagnosis not present

## 2020-10-07 LAB — VITAMIN B1: Vitamin B1 (Thiamine): 24 nmol/L (ref 8–30)

## 2020-10-07 NOTE — Therapy (Addendum)
[facility] [address] [place], [place], [address] Phone: [phone]   Fax:  [phone]  Physical Therapy Treatment  Patient Details  Name: Benjamin Hahn MRN: [id number] Date of Birth: Dec 13, 1932 Referring Provider (PT): Berle Mull, MD   Encounter Date: 10/07/2020   PT End of Session - 10/07/20 0936    Visit Number 7    Number of Visits 18    Date for PT Re-Evaluation 24/58/09   Recert completed 05/01/3381   Authorization Type Medicare    Progress Note Due on Visit --   completed at visit 6, due to recert   PT Start Time 0934    PT Stop Time 1015    PT Time Calculation (min) 41 min    Equipment Utilized During Treatment Gait belt    Activity Tolerance Patient tolerated treatment well    Behavior During Therapy [facility] for tasks assessed/performed           Past Medical History:  Diagnosis Date  . Anxiety   . Arthritis    might be in back, no problems  . BPH (benign prostatic hyperplasia)   . Depression 1990s  . GERD (gastroesophageal reflux disease)    occasional  . Hypercholesteremia    controled  . Hypothyroid   . MI (myocardial infarction) ([place])   . Shingles May 2014   "Right face, still has some"  . Temporal arteritis ([place])   . Transient blindness of both eyes     Past Surgical History:  Procedure Laterality Date  . ARTERY BIOPSY Right 06/20/2013   Procedure: BIOPSY TEMPORAL ARTERY RIGHT;  Surgeon: Earnstine Regal, MD;  Location: [facility];  Service: General;  Laterality: Right;  . CARDIAC CATHETERIZATION N/A 09/02/2015   Procedure: Left Heart Cath and Coronary Angiography;  Surgeon: Charolette Forward, MD;  Location: [facility];  Service: Cardiovascular;  Laterality: N/A;  . CARDIAC CATHETERIZATION N/A 09/02/2015   Procedure: Coronary Stent Intervention;  Surgeon: Charolette Forward, MD;  Location: [facility];  Service: Cardiovascular;  Laterality: N/A;  1.  mid RCA      (3.0/28mm Xience) 2.   Mid LAD      (3.0/23mm Xience)  . CRANIOTOMY N/A 04/19/2019   Procedure: CRANIOTOMY HEMATOMA EVACUATION SUBDURAL;  Surgeon: Jovita Gamma, MD;  Location: [facility];  Service: Neurosurgery;  Laterality: N/A;  CRANIOTOMY HEMATOMA EVACUATION SUBDURAL  . None      Vitals:   10/07/20 1135  BP: 122/64   BP measured due to pt's c/o dizziness with standing exercises.  Dizziness subsides upon sitting.  BP measured in sitting.   Subjective Assessment - 10/07/20 0932    Subjective Nothing new.  No falls.  Wife reports he's doing better with R foot stepping longer.    Patient is accompained by: Family member   spouse in lobby   Patient Stated Goals Patient:  Would like to graduate to cane if I could.  Wife:  want to make sure he's balanced and not falling; the walker is okay with me.    Currently in Pain? No/denies                             [facility] Adult PT Treatment/Exercise - 10/07/20 0001      Transfers   Transfers Sit to Stand;Stand to Sit    Sit to Stand 5: Supervision;With upper extremity assist;From chair/3-in-1;From bed    Stand to Sit 5:  Supervision;With upper extremity assist;To chair/3-in-1;To bed    Transfer Cueing Cues to make sure brakes are locked and for upright posture upon standing.    Comments At least 5 reps throughout session, from mat table or from locked rollator.      Ambulation/Gait   Ambulation/Gait Yes    Ambulation/Gait Assistance 4: Min guard    Ambulation/Gait Assistance Details Counted steps in 20 ft distance (16 steps), then with cues for longer strides and improved attention to taking longer steps on RLE, pt takes 15 steps in same distance.  Continued occasional cues, but pt with overall improved awareness of taking increased RLE step lnegth and foot clearance, >50% of the time.    Ambulation Distance (Feet) 345 Feet   230; 100 ft   Assistive device Rollator    Gait Pattern Step-through pattern;Step-to pattern;Decreased step length -  right;Decreased hip/knee flexion - right;Decreased dorsiflexion - right;Shuffle    Ambulation Surface Level;Indoor    Gait Comments Pt wants to try to work without walker in PT session.  PT explained that due to pt's inconsistent RLE foot clearance and step length and due to decreased balance, pt is at high fall risk and using rollator is safest option.               Balance Exercises - 10/07/20 0001      Balance Exercises: Standing   Stepping Strategy Anterior;Lateral;UE support;10 reps;Limitations    Stepping Strategy Limitations One set, consecutive step over obstacle, 10 reps, then return to middle.  2nd set alternating legs, stepping over obstacles, x 10 reps.  UE support and cues for slowed pace and brief pause in the middle position.    Sidestepping 3 reps;Limitations    Sidestepping Limitations Initial sidestep along counter, R and L, counted 6 steps, then additional 2 trails, pt able to perform in 4 steps (counting steps:  less steps = longer steps)    Heel Raises Both;10 reps;Limitations    Heel Raises Limitations 3 sec hold    Toe Raise Both;10 reps;Limitations    Toe Raise Limitations 3 sec hold    Other Standing Exercises Forwards/back stepping along counter, counting steps (11 initial forward), then with longer steps, pt takes 5-6 steps forward, additional 2 reps.  Cues for equal, even steps in backwards direction.          Seated ankle dorsiflexion/plantarflexion, 10 reps 3 sec hold  Reviewed HEP given last visit:  Pt requires verbal and tactile cues for technique throughout and to slow pace of exercises.  Step forward with counter support - 1 x daily - 5 x weekly - 1-2 sets - 10 reps  -Cues for increased foot clearance and step length Side Stepping with Counter Support - 1 x daily - 5 x weekly - 1-2 sets - 10 reps  -Cues for increased foot clearance and step length.   Staggered Stance Forward Backward Weight Shift with Counter Support - 1 x daily - 5 x weekly - 1-2  sets - 10 reps  -Cues for technique for rocking through hips and for ankle dorsiflexion     PT Short Term Goals - 09/30/20 2022      PT SHORT TERM GOAL #1   Title ---             PT Long Term Goals - 09/30/20 2020      PT LONG TERM GOAL #1   Title Pt will perform progression of HEP for improved balance, strength, and gait.  For  all LTGs:  TARGET 11/01/2020    Time 4    Period Weeks    Status On-going      PT LONG TERM GOAL #2   Title Pt will improve TUG score to less than or equal to 13.5 seconds for decreased fall risk.    Baseline 17.12 at eval, 17.81 09/16/20    Time 4    Period Weeks    Status On-going      PT LONG TERM GOAL #3   Title Pt will improve Berg score to at least 41/56 for decreased fall risk.    Baseline 24/56 eval, 36/56 09/16/20    Time 4    Period Weeks    Status Revised      PT LONG TERM GOAL #4   Title Pt will improve gait velocity to at least 2.62 ft/sec for improved gait efficiency and safety.    Baseline 2.2 ft/sec    Time 4    Period Weeks    Status On-going      PT LONG TERM GOAL #5   Title Pt will verbalize/demo understanding of continued community fitness plan to maximize functional gains made in PT.    Time 4    Period Weeks    Status On-going                 Plan - 10/07/20 1130    Clinical Impression Statement Reviewed HEP from previous visit, with pt needing cues throughout for slowed pace and for optimal technique.  Progressed in session to using obstacles for stepping exercises, with improved foot clearance and improved slowed pace.  Pt does have narrowed BOS upon returning to midline, and despite multi-modal cues, pt does not widen BOS.  He has one episode of LOB towards the right at counter due to narrowed BOS, needing therapist assist to steady/regain balance.  He continues to benefit from skilled PT to address balance, functional strength, and gait for improved overall functional mobility and decreased fall risk.  (Due to  scheduling conflicts, pt will miss next several weeks and will resume scheduled sessions early March).    Personal Factors and Comorbidities Comorbidity 3+;Time since onset of injury/illness/exacerbation    Comorbidities See PMH    Examination-Activity Limitations Locomotion Level;Transfers;Stand    Examination-Participation Restrictions Community Activity;Other   Community exercise; going out to eat   Stability/Clinical Decision Making Evolving/Moderate complexity    Rehab Potential Good    PT Frequency 1x / week    PT Duration 4 weeks   recert 10/03/7987   PT Treatment/Interventions ADLs/Self Care Home Management;DME Instruction;Gait training;Stair training;Functional mobility training;Therapeutic activities;Therapeutic exercise;Balance training;Neuromuscular re-education;Patient/family education    PT Next Visit Plan Review recent updates to HEP again; work with obstacles/counting steps for improved step length and foot clearance.  Need to focus on balance, functional strength/transfers, use of machines; work towards LTGs (likely planning on d/c at end of scheduled visits, per pt's earlier request).    PT Home Exercise Plan Access Code: [email or web address]    Consulted and Agree with Plan of Care Patient;Family member/caregiver    Family Member Consulted wife at end of session           Patient will benefit from skilled therapeutic intervention in order to improve the following deficits and impairments:  Abnormal gait,Difficulty walking,Decreased safety awareness,Decreased balance,Decreased mobility,Decreased strength,Postural dysfunction  Visit Diagnosis: Other abnormalities of gait and mobility  Unsteadiness on feet     Problem List Patient Active Problem List   Diagnosis Date  Noted  . Hyperglycemia 10/02/2020  . Bradycardia 10/02/2020  . Routine general medical examination at a health care facility 12/09/2019  . Psychophysiological insomnia 12/07/2019  . Deficiency anemia 06/01/2019  .  Vitamin D deficiency 06/01/2019  . Chronic idiopathic constipation 06/01/2019  . Mild intermittent asthma with acute exacerbation 02/08/2018  . Hypothyroidism 03/14/2013  . Hyperlipidemia LDL goal <70 03/14/2013  . BPH (benign prostatic hypertrophy) 03/14/2013  . Hypogonadism, male 03/14/2013  . Erectile dysfunction 03/14/2013    Frazier Butt. 10/07/2020, 11:37 AM  Frazier Butt., PT   [facility] [address] [place], [place], [address] Phone: [phone]   Fax:  [phone]  Name: Benjamin Hahn MRN: [id number] Date of Birth: 20-Jan-1933

## 2020-10-21 ENCOUNTER — Ambulatory Visit: Payer: Medicare Other | Admitting: Physical Therapy

## 2020-10-23 ENCOUNTER — Encounter: Payer: BLUE CROSS/BLUE SHIELD | Admitting: Internal Medicine

## 2020-10-28 ENCOUNTER — Encounter: Payer: Self-pay | Admitting: Physical Therapy

## 2020-10-28 ENCOUNTER — Ambulatory Visit: Payer: Medicare Other | Attending: Internal Medicine | Admitting: Physical Therapy

## 2020-10-28 ENCOUNTER — Other Ambulatory Visit: Payer: Self-pay

## 2020-10-28 DIAGNOSIS — R2689 Other abnormalities of gait and mobility: Secondary | ICD-10-CM | POA: Diagnosis not present

## 2020-10-28 DIAGNOSIS — R42 Dizziness and giddiness: Secondary | ICD-10-CM | POA: Insufficient documentation

## 2020-10-28 DIAGNOSIS — M6281 Muscle weakness (generalized): Secondary | ICD-10-CM | POA: Insufficient documentation

## 2020-10-28 DIAGNOSIS — R2681 Unsteadiness on feet: Secondary | ICD-10-CM | POA: Insufficient documentation

## 2020-10-28 DIAGNOSIS — R293 Abnormal posture: Secondary | ICD-10-CM | POA: Insufficient documentation

## 2020-10-28 NOTE — Therapy (Signed)
[facility] [address] [place], [place], [address] Phone: [phone]   Fax:  [phone]  Physical Therapy Treatment  Patient Details  Name: Benjamin Hahn MRN: [id number] Date of Birth: October 27, 1932 Referring Provider (PT): Berle Mull, MD   Encounter Date: 10/28/2020   PT End of Session - 10/28/20 1155    Visit Number 8    Number of Visits 18    Date for PT Re-Evaluation 85/02/77   Recert completed 11/22/2876   Authorization Type Medicare    Progress Note Due on Visit --   completed at visit 6, due to recert   PT Start Time 0850    PT Stop Time 0930    PT Time Calculation (min) 40 min    Equipment Utilized During Treatment Gait belt    Activity Tolerance Patient tolerated treatment well    Behavior During Therapy [facility] for tasks assessed/performed           Past Medical History:  Diagnosis Date  . Anxiety   . Arthritis    might be in back, no problems  . BPH (benign prostatic hyperplasia)   . Depression 1990s  . GERD (gastroesophageal reflux disease)    occasional  . Hypercholesteremia    controled  . Hypothyroid   . MI (myocardial infarction) ([place])   . Shingles May 2014   "Right face, still has some"  . Temporal arteritis ([place])   . Transient blindness of both eyes     Past Surgical History:  Procedure Laterality Date  . ARTERY BIOPSY Right 06/20/2013   Procedure: BIOPSY TEMPORAL ARTERY RIGHT;  Surgeon: Earnstine Regal, MD;  Location: [facility];  Service: General;  Laterality: Right;  . CARDIAC CATHETERIZATION N/A 09/02/2015   Procedure: Left Heart Cath and Coronary Angiography;  Surgeon: Charolette Forward, MD;  Location: [facility] [place] [facility];  Service: Cardiovascular;  Laterality: N/A;  . CARDIAC CATHETERIZATION N/A 09/02/2015   Procedure: Coronary Stent Intervention;  Surgeon: Charolette Forward, MD;  Location: [facility];  Service: Cardiovascular;  Laterality: N/A;  1.  mid RCA      (3.0/28mm Xience) 2.   Mid LAD      (3.0/23mm Xience)  . CRANIOTOMY N/A 04/19/2019   Procedure: CRANIOTOMY HEMATOMA EVACUATION SUBDURAL;  Surgeon: Jovita Gamma, MD;  Location: [name];  Service: Neurosurgery;  Laterality: N/A;  CRANIOTOMY HEMATOMA EVACUATION SUBDURAL  . None      There were no vitals filed for this visit.   Subjective Assessment - 10/28/20 0853    Subjective No falls, feel balance is pretty good.  Got a new, lighter walker.  Easier for my wife to put in and out of the car.    Patient is accompained by: Family member   spouse in lobby   Patient Stated Goals Patient:  Would like to graduate to cane if I could.  Wife:  want to make sure he's balanced and not falling; the walker is okay with me.    Currently in Pain? No/denies                             [facility] Adult PT Treatment/Exercise - 10/28/20 0001      Transfers   Transfers Sit to Stand;Stand to Sit    Sit to Stand 5: Supervision;With upper extremity assist;From chair/3-in-1;From bed    Stand to Sit 5: Supervision;With upper extremity assist;To chair/3-in-1;To bed    Comments At least  5 reps throughout session, from mat table or from locked rollator.      Ambulation/Gait   Ambulation/Gait Yes    Ambulation/Gait Assistance 4: Min guard    Ambulation Distance (Feet) 115 Feet   x 2, 60 x 2; then 230 ft   Assistive device Rollator   Light-weight version of front wheeled RW   Gait Pattern Step-through pattern;Step-to pattern;Decreased step length - right;Decreased hip/knee flexion - right;Decreased dorsiflexion - right;Shuffle;Poor foot clearance - right    Ambulation Surface Level    Gait Comments Brought in lighter-weight version of front-wheeled walker today, which is new to patient; adjusted height to raise to appropriate height; also added tennis balls to help with steering of this walker.  Cues to use bar as target for increased step length, especially on RLE.      Knee/Hip Exercises: Aerobic   Other Aerobic Scifit  4 extremities on level 2.5 for 5 minutes, with RPM >/=45- 50 for strengthening and activity tolerance.  Cues for long strides on Scifit, as pt tends to go to short, fast steps, which ultimately decrease his speed.             Reviewed HEP (recent updates):  Pt requires cues for slowed pace and technique  Step forward with counter support - 1 x daily - 5 x weekly - 1-2 sets - 10 reps  -2nd set performed stepping over tall 2" block.  Cues to slow/stop between reps Side Stepping with Counter Support - 1 x daily - 5 x weekly - 1-2 sets - 10 reps  2nd set performed stepping over tall 2" block.  Cues to slow/stop between reps Staggered Stance Forward Backward Weight Shift with Counter Support - 1 x daily - 5 x weekly - 1-2 sets - 10 reps  -2nd set performed with brief hold at end range for increased ankle dorsiflexion   Pt rates effort level as 9/10 with 2nd set for increased intensity and deliberate movement patterns.  Rates effort level as 6/10 on first set.  Explained how he can increase deliberate effort and intensity with exercises at home.         PT Short Term Goals - 09/30/20 2022      PT SHORT TERM GOAL #1   Title ---             PT Long Term Goals - 09/30/20 2020      PT LONG TERM GOAL #1   Title Pt will perform progression of HEP for improved balance, strength, and gait.  For all LTGs:  TARGET 11/01/2020    Time 4    Period Weeks    Status On-going      PT LONG TERM GOAL #2   Title Pt will improve TUG score to less than or equal to 13.5 seconds for decreased fall risk.    Baseline 17.12 at eval, 17.81 09/16/20    Time 4    Period Weeks    Status On-going      PT LONG TERM GOAL #3   Title Pt will improve Berg score to at least 41/56 for decreased fall risk.    Baseline 24/56 eval, 36/56 09/16/20    Time 4    Period Weeks    Status Revised      PT LONG TERM GOAL #4   Title Pt will improve gait velocity to at least 2.62 ft/sec for improved gait efficiency  and safety.    Baseline 2.2 ft/sec    Time  4    Period Weeks    Status On-going      PT LONG TERM GOAL #5   Title Pt will verbalize/demo understanding of continued community fitness plan to maximize functional gains made in PT.    Time 4    Period Weeks    Status On-going                 Plan - 10/28/20 1156    Clinical Impression Statement Pt has missed last several weeks due to scheduling conflicts.  He returns today with new lighter weight walker, which PT adjusts and adds tennis balls for apporpriate height and ease of steering.  Continued to work on exercises for lower extremity functional strenghtening and increased intensity/deliberate movements in RLE.  Pt is able to improve with targeted cues and reminders, but does revert back to smaller step length and smaller motion on RLE after cues removed.  Next week is last visit in [place], and may look at discharge.    Personal Factors and Comorbidities Comorbidity 3+;Time since onset of injury/illness/exacerbation    Comorbidities See PMH    Examination-Activity Limitations Locomotion Level;Transfers;Stand    Examination-Participation Restrictions Community Activity;Other   Community exercise; going out to eat   Stability/Clinical Decision Making Evolving/Moderate complexity    Rehab Potential Good    PT Frequency 1x / week    PT Duration 4 weeks   recert 4/0/9811   PT Treatment/Interventions ADLs/Self Care Home Management;DME Instruction;Gait training;Stair training;Functional mobility training;Therapeutic activities;Therapeutic exercise;Balance training;Neuromuscular re-education;Patient/family education    PT Next Visit Plan Check LTGs and plan for d/c next week; invite wife to session for instructions/education    PT Home Exercise Plan Access Code: [email or web address]    Consulted and Agree with Plan of Care Patient;Family member/caregiver    Family Member Consulted wife at end of session           Patient will benefit from skilled  therapeutic intervention in order to improve the following deficits and impairments:  Abnormal gait,Difficulty walking,Decreased safety awareness,Decreased balance,Decreased mobility,Decreased strength,Postural dysfunction  Visit Diagnosis: Other abnormalities of gait and mobility  Unsteadiness on feet  Muscle weakness (generalized)     Problem List Patient Active Problem List   Diagnosis Date Noted  . Hyperglycemia 10/02/2020  . Bradycardia 10/02/2020  . Routine general medical examination at a health care facility 12/09/2019  . Psychophysiological insomnia 12/07/2019  . Deficiency anemia 06/01/2019  . Vitamin D deficiency 06/01/2019  . Chronic idiopathic constipation 06/01/2019  . Mild intermittent asthma with acute exacerbation 02/08/2018  . Hypothyroidism 03/14/2013  . Hyperlipidemia LDL goal <70 03/14/2013  . BPH (benign prostatic hypertrophy) 03/14/2013  . Hypogonadism, male 03/14/2013  . Erectile dysfunction 03/14/2013    Benjamin Nyman W. 10/28/2020, 12:00 PM  Frazier Butt., PT   [facility] [address] [place], [place], [address] Phone: [phone]   Fax:  [phone]  Name: Benjamin Hahn MRN: [id number] Date of Birth: Jan 14, 1933

## 2020-11-04 ENCOUNTER — Ambulatory Visit: Payer: Medicare Other | Admitting: Physical Therapy

## 2020-11-04 ENCOUNTER — Other Ambulatory Visit: Payer: Self-pay

## 2020-11-04 ENCOUNTER — Encounter: Payer: Self-pay | Admitting: Physical Therapy

## 2020-11-04 DIAGNOSIS — M6281 Muscle weakness (generalized): Secondary | ICD-10-CM | POA: Diagnosis not present

## 2020-11-04 DIAGNOSIS — R42 Dizziness and giddiness: Secondary | ICD-10-CM | POA: Diagnosis not present

## 2020-11-04 DIAGNOSIS — R2689 Other abnormalities of gait and mobility: Secondary | ICD-10-CM | POA: Diagnosis not present

## 2020-11-04 DIAGNOSIS — R2681 Unsteadiness on feet: Secondary | ICD-10-CM

## 2020-11-04 DIAGNOSIS — R293 Abnormal posture: Secondary | ICD-10-CM

## 2020-11-04 NOTE — Patient Instructions (Signed)
Access Code: [email or web address] URL: [email or web address] Date: 09/30/2020 Prepared by: Mady Haagensen  Exercises Standing March with Counter Support - 1 x daily - 5 x weekly - 1 sets - 10 reps - 3 hold Standing Hip Abduction with Counter Support - 1 x daily - 5 x weekly - 1 sets - 10 reps Heel rises with counter support - 1 x daily - 5 x weekly - 3 sets - 10 reps Recumbent Bike - 1 x daily - 3 x weekly - 5-7 minutes time Standing Tandem Balance with Counter Support - 1 x daily - 5 x weekly - 1 sets - 3 reps - 30 hold Standing Single Leg Stance with Counter Support - 1 x daily - 5 x weekly - 1 sets - 3 reps - 10 hold Mini Squat with Counter Support - 1 x daily - 5 x weekly - 3 sets - 10 reps Step forward with counter support - 1 x daily - 5 x weekly - 1-2 sets - 10 reps Side Stepping with Counter Support - 1 x daily - 5 x weekly - 1-2 sets - 10 reps Staggered Stance Forward Backward Weight Shift with Counter Support - 1 x daily - 5 x weekly - 1-2 sets - 10 reps

## 2020-11-04 NOTE — Therapy (Signed)
[facility] [address] [place], [place], [address] Phone: [phone]   Fax:  [phone]  Physical Therapy Treatment  Patient Details  Name: Benjamin Hahn MRN: [id number] Date of Birth: Mar 18, 1933 Referring Provider (PT): Berle Mull, MD   Encounter Date: 11/04/2020   PT End of Session - 11/04/20 1044    Visit Number 9    Number of Visits 18    Date for PT Re-Evaluation 49/67/59   Recert completed 08/29/3844   Authorization Type Medicare    Authorization Time Period --    Progress Note Due on Visit --   completed at visit 6, due to recert   PT Start Time 0850    PT Stop Time 0934    PT Time Calculation (min) 44 min    Equipment Utilized During Treatment --    Activity Tolerance Patient tolerated treatment well    Behavior During Therapy [facility] for tasks assessed/performed           Past Medical History:  Diagnosis Date  . Anxiety   . Arthritis    might be in back, no problems  . BPH (benign prostatic hyperplasia)   . Depression 1990s  . GERD (gastroesophageal reflux disease)    occasional  . Hypercholesteremia    controled  . Hypothyroid   . MI (myocardial infarction) ([place])   . Shingles May 2014   "Right face, still has some"  . Temporal arteritis ([place])   . Transient blindness of both eyes     Past Surgical History:  Procedure Laterality Date  . ARTERY BIOPSY Right 06/20/2013   Procedure: BIOPSY TEMPORAL ARTERY RIGHT;  Surgeon: Earnstine Regal, MD;  [facility];  Service: General;  Laterality: Right;  . CARDIAC CATHETERIZATION N/A 09/02/2015   Procedure: Left Heart Cath and Coronary Angiography;  Surgeon: Charolette Forward, MD;  Location: [facility];  Service: Cardiovascular;  Laterality: N/A;  . CARDIAC CATHETERIZATION N/A 09/02/2015   Procedure: Coronary Stent Intervention;  Surgeon: Charolette Forward, MD;  Location: [facility];  Service: Cardiovascular;  Laterality: N/A;  1.  mid RCA       (3.0/28mm Xience) 2.  Mid LAD      (3.0/23mm Xience)  . CRANIOTOMY N/A 04/19/2019   Procedure: CRANIOTOMY HEMATOMA EVACUATION SUBDURAL;  Surgeon: Jovita Gamma, MD;  Location: [facility];  Service: Neurosurgery;  Laterality: N/A;  CRANIOTOMY HEMATOMA EVACUATION SUBDURAL  . None      There were no vitals filed for this visit.   Subjective Assessment - 11/04/20 0858    Subjective Cues this morning for safety when walking with new lighter RW; cued pt to stop walking if he has to let go to prevent LOB forwards.  Pt reporting dizziness and room spinning that began this past week.    Patient is accompained by: Family member   spouse in lobby   Patient Stated Goals Patient:  Would like to graduate to cane if I could.  Wife:  want to make sure he's balanced and not falling; the walker is okay with me.    Currently in Pain? No/denies              [facility] PT Assessment - 11/04/20 0902      Assessment   Medical Diagnosis Balance    Referring Provider (PT) Berle Mull, MD    Onset Date/Surgical Date 07/05/20    Hand Dominance Right      Precautions   Precautions Fall  Prior Function   Level of Independence Independent      Standardized Balance Assessment   Standardized Balance Assessment Berg Balance Test;Timed Up and Go Test;10 meter walk test    10 Meter Walk 18.19 seconds or 1.8 ft/sec with new RW      Berg Balance Test   Sit to Stand Able to stand  independently using hands    Standing Unsupported Able to stand safely 2 minutes    Sitting with Back Unsupported but Feet Supported on Floor or Stool Able to sit safely and securely 2 minutes    Stand to Sit Uses backs of legs against chair to control descent    Transfers Able to transfer safely, definite need of hands    Standing Unsupported with Eyes Closed Able to stand 10 seconds safely    Standing Unsupported with Feet Together Able to place feet together independently and stand for 1 minute with supervision    From  Standing, Reach Forward with Outstretched Arm Can reach forward >12 cm safely (5")    From Standing Position, Pick up Object from Floor Able to pick up shoe, needs supervision    From Standing Position, Turn to Look Behind Over each Shoulder Looks behind one side only/other side shows less weight shift    Turn 360 Degrees Needs assistance while turning    Standing Unsupported, Alternately Place Feet on Step/Stool Able to complete >2 steps/needs minimal assist    Standing Unsupported, One Foot in Front Able to take small step independently and hold 30 seconds    Standing on One Leg Unable to try or needs assist to prevent fall    Total Score 35    Berg comment: Scores <45/56 indicate increased fall risk.      Timed Up and Go Test   TUG Normal TUG    Normal TUG (seconds) 16.5    TUG Comments Scores >13.5 sec indicate increased fall risk.                         [facility] Adult PT Treatment/Exercise - 11/04/20 1036      Transfers   Transfers Sit to Stand;Stand to [employer]    Sit to Stand 4: Min assist    Sit to Stand Details (indicate cue type and reason) training for sit > stand while decreasing tendency to fall posterior when standing.  Provided cues for more upright posture (anterior pelvic) tilt to shift COG more forwards on BOS and to increase anterior lean and tibial translation to shift COG over BOS before standing.  Performed x5 reps having pt reach forwards with UE (as tolerated due to RTC injury) and therapist facilitating increased anterior lean    Stand to Sit 4: Min assist    Stand to Sit Details therapist facilitating increased anterior lean from hips to offset posterior weight shift when sitting and improve control when descending.  Multiple cues to reach back for surface prior to starting stand >sit    Stand Pivot Transfers 4: Min assist    Stand Pivot Transfer Details (indicate cue type and reason) after TUG and pt performing stand > sit before fully  pivoting and while holding RW performed multiple repetitions of pivot prior to sitting and cued pt to focus on the "turn" and not the "sitting".  Cued to perform full pivot with RW and then reach back for seat before starting stand > sit for improved safety and control.  Therapeutic Activites    Therapeutic Activities Other Therapeutic Activities    Other Therapeutic Activities initiated review of HEP with staggered stance weight shifting - cued pt to shift from pelvis and use of ankles for push off (pt tends shift by flexing knees).  Wife reports pt does not consistently do exercises.  Discussed importance of setting aside time each day to perform HEP or go to gym; advised pt to continue to use "therapy time" as HEP time once D/C from therapy to encourage consistentcy.  Not able to review full HEP, set up one more session with primary PT to review before D/C.                  PT Education - 11/04/20 1043    Education Details safety with RW and transfers, see TA; progress towards goals, one more visit scheduled to review HEP and recommendations    Person(s) Educated Patient;Spouse    Methods Explanation;Demonstration    Comprehension Verbalized understanding;Returned demonstration            PT Short Term Goals - 09/30/20 2022      PT SHORT TERM GOAL #1   Title ---             PT Long Term Goals - 11/04/20 1046      PT LONG TERM GOAL #1   Title Pt will perform progression of HEP for improved balance, strength, and gait.    Time 4    Period Weeks    Status On-going    Target Date 11/29/20      PT LONG TERM GOAL #2   Title Pt will improve TUG score to less than or equal to 13.5 seconds for decreased fall risk.    Baseline 16 seconds with RW - improved but not to goal    Status Partially Met      PT LONG TERM GOAL #3   Title Pt will improve Berg score to at least 41/56 for decreased fall risk.    Baseline 35/56 no change since January    Status Not Met      PT  LONG TERM GOAL #4   Title Pt will improve gait velocity to at least 2.62 ft/sec for improved gait efficiency and safety.    Baseline 1.8 ft/sec with RW    Status Not Met      PT LONG TERM GOAL #5   Title Pt will verbalize/demo understanding of continued community fitness plan to maximize functional gains made in PT.    Time 4    Period Weeks    Status On-going    Target Date 11/29/20                 Plan - 11/04/20 1044    Clinical Impression Statement Initiated assessment of patient progress towards LTG.  Pt partially met TUG goal as his time to perform improved by 1 second with RW but is still >13.5 seconds indicating increased risk for falls; pt also continues to require cues for safety with sit <> stand and stand pivot with RW.  Pt did not met gait velocity goal or BERG goal; these scores improved from eval but have not improved since goal check in January.  Pt continues to demonstrate increased difficulty with activities and movement that requires single limb stance stability, especially when he does not have UE support.  Pt also continues to require supervision, cues and encouragement to perform HEP consistently and effectively.  Pt also is  reporting onset of dizziness which may need assessment prior to D/C if it continues to affect pt with mobility.  Will review final HEP and recommendations at final visit added next week.    Personal Factors and Comorbidities Comorbidity 3+;Time since onset of injury/illness/exacerbation    Comorbidities See PMH    Examination-Activity Limitations Locomotion Level;Transfers;Stand    Examination-Participation Restrictions Community Activity;Other   Community exercise; going out to eat   Stability/Clinical Decision Making Evolving/Moderate complexity    Rehab Potential Good    PT Frequency 1x / week    PT Duration 4 weeks   recert 11/26/8097   PT Treatment/Interventions ADLs/Self Care Home Management;DME Instruction;Gait training;Stair  training;Functional mobility training;Therapeutic activities;Therapeutic exercise;Balance training;Neuromuscular re-education;Patient/family education    PT Next Visit Plan 10th visit PN or already done on visit 6??  LTG have been checked - just need to review/finalize HEP and community wellness recommendations.  Need to add a stretch for Hamstrings/gastroc ROM.   Towards the end of the session he reported dizziness with turning and said he had an acute onset of vertigo with supine <> sit at home - did not have time to assess, if you want to add another visit with me, Jerene Pitch or Vinnie Level to assess please let me know!    PT Home Exercise Plan Access Code: [email or web address]    Consulted and Agree with Plan of Care Patient;Family member/caregiver    Family Member Consulted wife           Patient will benefit from skilled therapeutic intervention in order to improve the following deficits and impairments:  Abnormal gait,Difficulty walking,Decreased safety awareness,Decreased balance,Decreased mobility,Decreased strength,Postural dysfunction  Visit Diagnosis: Other abnormalities of gait and mobility  Unsteadiness on feet  Muscle weakness (generalized)  Abnormal posture  Dizziness and giddiness     Problem List Patient Active Problem List   Diagnosis Date Noted  . Hyperglycemia 10/02/2020  . Bradycardia 10/02/2020  . Routine general medical examination at a health care facility 12/09/2019  . Psychophysiological insomnia 12/07/2019  . Deficiency anemia 06/01/2019  . Vitamin D deficiency 06/01/2019  . Chronic idiopathic constipation 06/01/2019  . Mild intermittent asthma with acute exacerbation 02/08/2018  . Hypothyroidism 03/14/2013  . Hyperlipidemia LDL goal <70 03/14/2013  . BPH (benign prostatic hypertrophy) 03/14/2013  . Hypogonadism, male 03/14/2013  . Erectile dysfunction 03/14/2013    Rico Junker, PT, DPT 11/04/20    10:55 AM    [facility] [address] [place], [place], [address] Phone: [phone]   Fax:  [phone]  Name: Benjamin Hahn MRN: [id number] Date of Birth: 09/22/1932

## 2020-11-05 ENCOUNTER — Ambulatory Visit (INDEPENDENT_AMBULATORY_CARE_PROVIDER_SITE_OTHER): Payer: Medicare Other | Admitting: Psychiatry

## 2020-11-05 DIAGNOSIS — F3341 Major depressive disorder, recurrent, in partial remission: Secondary | ICD-10-CM | POA: Diagnosis not present

## 2020-11-05 MED ORDER — VENLAFAXINE HCL ER 37.5 MG PO CP24: 37.5000 mg | ORAL_CAPSULE | Freq: Every day | ORAL | 6 refills | Status: DC

## 2020-11-05 MED ORDER — HYDROXYZINE HCL 25 MG PO TABS: 25.0000 mg | ORAL_TABLET | Freq: Every day | ORAL | 1 refills | Status: DC

## 2020-11-05 MED ORDER — LORAZEPAM 0.5 MG PO TABS: 0.5000 mg | ORAL_TABLET | Freq: Every day | ORAL | 4 refills | Status: DC

## 2020-11-05 MED ORDER — BUPROPION HCL ER (SR) 100 MG PO TB12: ORAL_TABLET | ORAL | 4 refills | Status: DC

## 2020-11-05 NOTE — Progress Notes (Signed)
Patient ID: Benjamin Hahn, male   DOB: 09-28-1932, 87 y.o.   MRN: [id number] [facility] MD/PA/NP OP Progress Note  11/05/2020 3:26 PM ZAION HREHA  MRN:  [id number]  Chief Complaint: Major depression remission Subjective: Doing great  Today patient is actually doing better.  He continues working a few days during the week for only an hour or 2.  His wife takes him to his workplace.  Uses walker.  The patient is enjoying some TV and is watching some basketball particularly the tournament.  He is also reading.  He is sleeping and eating well.  He is very hyper focused on his medicines.  Since I have seen him we made some changes in his medicines.  I think overall it is beneficial.  We reduced his Effexor from 75 mg down to 37.5.  He said that higher doses really did a trigger point his head and was too much.  The patient was taking Wellbutrin but the pills were too large.  Therefore we changed his Wellbutrin down to a 100 mg pill slow release and he takes 2 each morning.  He is only getting 60 so is not really taking any more in the afternoon.  His sleeping medication is Restoril 15 mg and Ativan 0.5 mg.  He does not take both of them at the same time.  Rather he takes 1 and then switches to the.  He never takes them to get them.  He was taking Vistaril 25 mg nightly but today we asked him to stop it.  He describes getting up in the morning and feeling somewhat groggy.  The patient is eating very well.  He is got a reasonable amount of energy.  Today we educated him that his medicines do not affect his heart or his liver.  The patient drinks no alcohol.  He seems to be getting along fairly well with his wife at this time.  Past Medical History:  Diagnosis Date  . Anxiety   . Arthritis    might be in back, no problems  . BPH (benign prostatic hyperplasia)   . Depression 1990s  . GERD (gastroesophageal reflux disease)    occasional  . Hypercholesteremia    controled  . Hypothyroid   . MI  (myocardial infarction) ([place])   . Shingles May 2014   "Right face, still has some"  . Temporal arteritis ([place])   . Transient blindness of both eyes     Past Surgical History:  Procedure Laterality Date  . ARTERY BIOPSY Right 06/20/2013   Procedure: BIOPSY TEMPORAL ARTERY RIGHT;  Surgeon: Earnstine Regal, MD;  [facility];  Service: General;  Laterality: Right;  . CARDIAC CATHETERIZATION N/A 09/02/2015   Procedure: Left Heart Cath and Coronary Angiography;  Surgeon: Charolette Forward, MD;  Location: [place] [facility];  Service: Cardiovascular;  Laterality: N/A;  . CARDIAC CATHETERIZATION N/A 09/02/2015   Procedure: Coronary Stent Intervention;  Surgeon: Charolette Forward, MD;  Location: [place] [facility];  Service: Cardiovascular;  Laterality: N/A;  1.  mid RCA      (3.0/28mm Xience) 2.  Mid LAD      (3.0/23mm Xience)  . CRANIOTOMY N/A 04/19/2019   Procedure: CRANIOTOMY HEMATOMA EVACUATION SUBDURAL;  Surgeon: Jovita Gamma, MD;  Location: [place];  Service: Neurosurgery;  Laterality: N/A;  CRANIOTOMY HEMATOMA EVACUATION SUBDURAL  . None      Family Psychiatric History:   Family History:  Family History  Problem Relation Age of Onset  .  Stroke Father        Died, 80s  . Stroke Sister        Living, 33  . Heart disease Mother        Died, 84  . Healthy Daughter   . Diabetes Maternal Grandmother   . Hypertension Neg Hx     Social History:  Social History   Socioeconomic History  . Marital status: Married    Spouse name: Not on file  . Number of children: Not on file  . Years of education: Not on file  . Highest education level: Not on file  Occupational History  . Not on file  Tobacco Use  . Smoking status: Former Smoker    Types: Cigarettes    Quit date: 08/24/1966    Years since quitting: 54.2  . Smokeless tobacco: Never Used  Substance and Sexual Activity  . Alcohol use: Not Currently    Comment: socially  . Drug use: Never  . Sexual activity: Not on file  Other  Topics Concern  . Not on file  Social History Narrative   ** Merged History Encounter **       Currently works as a Midwife.  Lives with wife and they have two healthy daughters.   Social Determinants of Health   Financial Resource Strain: Not on file  Food Insecurity: Not on file  Transportation Needs: Not on file  Physical Activity: Not on file  Stress: Not on file  Social Connections: Not on file    Allergies:  Allergies  Allergen Reactions  . Ativan [Lorazepam] Anxiety    Per other chart in Epic  . Lorazepam Anxiety    Metabolic Disorder Labs: Lab Results  Component Value Date   HGBA1C 5.5 04/14/2019   No results found for: PROLACTIN Lab Results  Component Value Date   CHOL 147 10/02/2020   TRIG 55.0 10/02/2020   HDL 79.30 10/02/2020   CHOLHDL 2 10/02/2020   VLDL 11.0 10/02/2020   LDLCALC 57 10/02/2020   LDLCALC 51 06/01/2019     Current Medications: Current Outpatient Medications  Medication Sig Dispense Refill  . acetaminophen (TYLENOL) 325 MG tablet Take 1-2 tablets (325-650 mg total) by mouth every 4 (four) hours as needed for mild pain or headache.    [name] acetaminophen (TYLENOL) 325 MG tablet Take 325-650 mg by mouth every 6 (six) hours as needed for mild pain or headache.    . ALPRAZolam (XANAX) 0.5 MG tablet Half q day  prn 25 tablet 3  . atorvastatin (LIPITOR) 40 MG tablet Take 1 tablet (40 mg total) by mouth daily at 6 PM. 30 tablet 3  . buPROPion (WELLBUTRIN SR) 100 MG 12 hr tablet Take two tablets (200 mg total) by mouth each morning. 60 tablet 4  . HYDROcodone-acetaminophen (NORCO) 10-325 MG tablet     . hydrOXYzine (ATARAX/VISTARIL) 25 MG tablet Take 1 tablet (25 mg total) by mouth at bedtime. 30 tablet 1  . levothyroxine (SYNTHROID) 88 MCG tablet Take 1 tablet (88 mcg total) by mouth daily. 90 tablet 1  . lidocaine (LIDODERM) 5 % Place 1 patch onto the skin daily. Remove & Discard patch within 12 hours or as directed by MD 30 patch 0  .  LORazepam (ATIVAN) 0.5 MG tablet Take 1 tablet (0.5 mg total) by mouth at bedtime. 30 tablet 4  . ramipril (ALTACE) 1.25 MG capsule Take 1.25 mg by mouth daily after supper.    . venlafaxine XR (EFFEXOR-XR) 37.5 MG 24  hr capsule Take 1 capsule (37.5 mg total) by mouth daily. 1 qam 30 capsule 6   No current facility-administered medications for this visit.    Neurologic: Headache: No Seizure: No Paresthesias: No  Musculoskeletal: Strength & Muscle Tone: within normal limits Gait & Station: normal Patient leans: NA  Psychiatric Specialty Exam: ROS  There were no vitals taken for this visit.There is no height or weight on file to calculate BMI.  General Appearance: Fairly Groomed  Eye Contact:  Good  Speech:  Clear and Coherent  Volume:  Normal  Mood:  Euthymic  Affect:  Appropriate  Thought Process:  Coherent  Orientation:  Full (Time, Place, and Person)  Thought Content:  WDL  Suicidal Thoughts:  No  Homicidal Thoughts:  No  Memory:  NA  Judgement:  Good  Insight:  Good  Psychomotor Activity:  Normal  Concentration:  Good  Recall:  Good  Fund of Knowledge: Good  Language: Good  Akathisia:  No  Handed:  Right  AIMS (if indicated):    Assets:  Desire for Improvement  ADL's:  Intact  Cognition: WNL  Sleep:      Treatment/plan Today we clarified his medications.  The patient has major depression and he will continue taking Wellbutrin 100 mg slow release 2 in the morning.  He will continue taking Effexor at the lower dose of 37.5 mg.  His second problem is that of insomnia.  He will continue alternating Restoril 15 mg with 8.5 mg dose of Ativan.  For now he will take no more Vistaril.  The patient seemed to understand these medications and he seems to be good with it.  He still has Xanax available for him 0.5 mg a half of a pill which she will take now and then in the middle of the night if he wakes up.  He does not take it regularly.  Patient is not suicidal.  I think he  is functioning better. 11/05/2020, 3:26 PM [facility] MD Progress Note  11/05/2020 3:26 PM AIKAM Hahn  MRN:  [id number] Subjective:  Feels well Principal Problem: Major depression, chronic, mild/residual Diagnosis:  Major depression, recurrent residual Today the patient is doing fairly well. He's been having some medical problems. He recently went for Thanksgiving to a length but unfortunately had a urinary tract infection. He also since then has been coughing and recently started on antibiotic. Emotionally he is fairly stable. He denies daily depression. He stays very active at work. He's been unable to exercise. The patient is sleeping well and has an increase in his appetite. His energy is good. Is no problems thinking or concentrating. He is recently having a productive cough but is no shortness of breath. He denies any chest pain. Is a stable relationship with his wife. His kidneys are good. He has 4 grandchildren. Everybody in his home is good. He only issue is that a number mornings he awakens and he feels oversedated. It occurs point of his wife feels uncomfortable with him driving will taken to work. Today reviewed his medications and is evident that he likely needs reduction. He also notes that his memory has change in his and is good. 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 Patient Active Problem List   Diagnosis Date Noted  . Hyperglycemia [R73.9] 10/02/2020  . Bradycardia [R00.1] 10/02/2020  . Routine general medical examination at a health care facility [Z00.00] 12/09/2019  . Psychophysiological insomnia [F51.04] 12/07/2019  .  Deficiency anemia [D53.9] 06/01/2019  . Vitamin D deficiency [E55.9] 06/01/2019  . Chronic idiopathic constipation [K59.04] 06/01/2019  . Mild intermittent asthma with acute exacerbation [J45.21] 02/08/2018  . Hypothyroidism [E03.9] 03/14/2013  . Hyperlipidemia LDL goal <70 [E78.5] 03/14/2013  . BPH (benign  prostatic hypertrophy) [N40.0] 03/14/2013  . Hypogonadism, male [E29.1] 03/14/2013  . Erectile dysfunction [N52.9] 03/14/2013   Total Time spent with patient: 30 minutes  Past Psychiatric History:   Past Medical History:  Past Medical History:  Diagnosis Date  . Anxiety   . Arthritis    might be in back, no problems  . BPH (benign prostatic hyperplasia)   . Depression 1990s  . GERD (gastroesophageal reflux disease)    occasional  . Hypercholesteremia    controled  . Hypothyroid   . MI (myocardial infarction) ([place])   . Shingles May 2014   "Right face, still has some"  . Temporal arteritis ([place])   . Transient blindness of both eyes     Past Surgical History:  Procedure Laterality Date  . ARTERY BIOPSY Right 06/20/2013   Procedure: BIOPSY TEMPORAL ARTERY RIGHT;  Surgeon: Earnstine Regal, MD;  [facility];  Service: General;  Laterality: Right;  . CARDIAC CATHETERIZATION N/A 09/02/2015   Procedure: Left Heart Cath and Coronary Angiography;  Surgeon: Charolette Forward, MD;  Location: [place] [facility];  Service: Cardiovascular;  Laterality: N/A;  . CARDIAC CATHETERIZATION N/A 09/02/2015   Procedure: Coronary Stent Intervention;  Surgeon: Charolette Forward, MD;  Location: [place] [facility];  Service: Cardiovascular;  Laterality: N/A;  1.  mid RCA      (3.0/28mm Xience) 2.  Mid LAD      (3.0/23mm Xience)  . CRANIOTOMY N/A 04/19/2019   Procedure: CRANIOTOMY HEMATOMA EVACUATION SUBDURAL;  Surgeon: Jovita Gamma, MD;  Location: [place];  Service: Neurosurgery;  Laterality: N/A;  CRANIOTOMY HEMATOMA EVACUATION SUBDURAL  . None     Family History:  Family History  Problem Relation Age of Onset  . Stroke Father        Died, 80s  . Stroke Sister        Living, 71  . Heart disease Mother        Died, 62  . Healthy Daughter   . Diabetes Maternal Grandmother   . Hypertension Neg Hx    Family Psychiatric  History:  Social History:  Social History   Substance and Sexual Activity   Alcohol Use Not Currently   Comment: socially     Social History   Substance and Sexual Activity  Drug Use Never    Social History   Socioeconomic History  . Marital status: Married    Spouse name: Not on file  . Number of children: Not on file  . Years of education: Not on file  . Highest education level: Not on file  Occupational History  . Not on file  Tobacco Use  . Smoking status: Former Smoker    Types: Cigarettes    Quit date: 08/24/1966    Years since quitting: 54.2  . Smokeless tobacco: Never Used  Substance and Sexual Activity  . Alcohol use: Not Currently    Comment: socially  . Drug use: Never  . Sexual activity: Not on file  Other Topics Concern  . Not on file  Social History Narrative   ** Merged History Encounter **       Currently works as a Midwife.  Lives with wife and they have two healthy daughters.  Social Determinants of Health   Financial Resource Strain: Not on file  Food Insecurity: Not on file  Transportation Needs: Not on file  Physical Activity: Not on file  Stress: Not on file  Social Connections: Not on file   Additional Social History:                         Sleep: Good  Appetite:  Good  Current Medications: Current Outpatient Medications  Medication Sig Dispense Refill  . acetaminophen (TYLENOL) 325 MG tablet Take 1-2 tablets (325-650 mg total) by mouth every 4 (four) hours as needed for mild pain or headache.    [name] acetaminophen (TYLENOL) 325 MG tablet Take 325-650 mg by mouth every 6 (six) hours as needed for mild pain or headache.    . ALPRAZolam (XANAX) 0.5 MG tablet Half q day  prn 25 tablet 3  . atorvastatin (LIPITOR) 40 MG tablet Take 1 tablet (40 mg total) by mouth daily at 6 PM. 30 tablet 3  . buPROPion (WELLBUTRIN SR) 100 MG 12 hr tablet Take two tablets (200 mg total) by mouth each morning. 60 tablet 4  . HYDROcodone-acetaminophen (NORCO) 10-325 MG tablet     . hydrOXYzine (ATARAX/VISTARIL) 25 MG  tablet Take 1 tablet (25 mg total) by mouth at bedtime. 30 tablet 1  . levothyroxine (SYNTHROID) 88 MCG tablet Take 1 tablet (88 mcg total) by mouth daily. 90 tablet 1  . lidocaine (LIDODERM) 5 % Place 1 patch onto the skin daily. Remove & Discard patch within 12 hours or as directed by MD 30 patch 0  . LORazepam (ATIVAN) 0.5 MG tablet Take 1 tablet (0.5 mg total) by mouth at bedtime. 30 tablet 4  . ramipril (ALTACE) 1.25 MG capsule Take 1.25 mg by mouth daily after supper.    . venlafaxine XR (EFFEXOR-XR) 37.5 MG 24 hr capsule Take 1 capsule (37.5 mg total) by mouth daily. 1 qam 30 capsule 6   No current facility-administered medications for this visit.    Lab Results:  No results found for this or any previous visit (from the past 48 hour(s)).  Physical Findings: AIMS:  , ,  ,  ,    CIWA:    COWS:     Musculoskeletal: Strength & Muscle Tone: within normal limits Gait & Station: normal Patient leans: Right  Psychiatric Specialty Exam: ROS  There were no vitals taken for this visit.There is no height or weight on file to calculate BMI.  General Appearance: Negative  Eye Contact::  Good  Speech:  Clear and Coherent  Volume:  Normal  Mood:  NA  Affect:  Appropriate  Thought Process:  Coherent  Orientation:  Full (Time, Place, and Person)  Thought Content:  WDL  Suicidal Thoughts:  No  Homicidal Thoughts:  No  Memory:  NA  Judgement:  Good  Insight:  Good  Psychomotor Activity:  Normal  Concentration:  Good  Recall:  Good  Fund of Knowledge:Good  Language: Good  Akathisia:  No  Handed:  Right  AIMS (if indicated):     Assets:  Desire for Improvement  ADL's:  Intact  Cognition: WNL  Sleep:      [facility] MD Progress Note  11/05/2020 3:26 PM Benjamin Hahn  MRN:  [id number] Subjective:  Feels well Principal Problem: Major depression, chronic, mild/residual Diagnosis:  Major depression, recurrent residual Today the patient is doing fairly well.  He's been having some medical problems. He  recently went for Thanksgiving to a length but unfortunately had a urinary tract infection. He also since then has been coughing and recently started on antibiotic. Emotionally he is fairly stable. He denies daily depression. He stays very active at work. He's been unable to exercise. The patient is sleeping well and has an increase in his appetite. His energy is good. Is no problems thinking or concentrating. He is recently having a productive cough but is no shortness of breath. He denies any chest pain. Is a stable relationship with his wife. His kidneys are good. He has 4 grandchildren. Everybody in his home is good. He only issue is that a number mornings he awakens and he feels oversedated. It occurs point of his wife feels uncomfortable with him driving will taken to work. Today reviewed his medications and is evident that he likely needs reduction. He also notes that his memory has change in his and is good. 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 Patient Active Problem List   Diagnosis Date Noted  . Hyperglycemia [R73.9] 10/02/2020  . Bradycardia [R00.1] 10/02/2020  . Routine general medical examination at a health care facility [Z00.00] 12/09/2019  . Psychophysiological insomnia [F51.04] 12/07/2019  . Deficiency anemia [D53.9] 06/01/2019  . Vitamin D deficiency [E55.9] 06/01/2019  . Chronic idiopathic constipation [K59.04] 06/01/2019  . Mild intermittent asthma with acute exacerbation [J45.21] 02/08/2018  . Hypothyroidism [E03.9] 03/14/2013  . Hyperlipidemia LDL goal <70 [E78.5] 03/14/2013  . BPH (benign prostatic hypertrophy) [N40.0] 03/14/2013  . Hypogonadism, male [E29.1] 03/14/2013  . Erectile dysfunction [N52.9] 03/14/2013   Total Time spent with patient: 30 minutes  Past Psychiatric History:   Past Medical History:  Past Medical History:  Diagnosis Date  . Anxiety   . Arthritis     might be in back, no problems  . BPH (benign prostatic hyperplasia)   . Depression 1990s  . GERD (gastroesophageal reflux disease)    occasional  . Hypercholesteremia    controled  . Hypothyroid   . MI (myocardial infarction) ([place])   . Shingles May 2014   "Right face, still has some"  . Temporal arteritis ([place])   . Transient blindness of both eyes     Past Surgical History:  Procedure Laterality Date  . ARTERY BIOPSY Right 06/20/2013   Procedure: BIOPSY TEMPORAL ARTERY RIGHT;  Surgeon: Earnstine Regal, MD;  [facility];  Service: General;  Laterality: Right;  . CARDIAC CATHETERIZATION N/A 09/02/2015   Procedure: Left Heart Cath and Coronary Angiography;  Surgeon: Charolette Forward, MD;  Location: [place] [facility];  Service: Cardiovascular;  Laterality: N/A;  . CARDIAC CATHETERIZATION N/A 09/02/2015   Procedure: Coronary Stent Intervention;  Surgeon: Charolette Forward, MD;  Location: [place] [facility];  Service: Cardiovascular;  Laterality: N/A;  1.  mid RCA      (3.0/28mm Xience) 2.  Mid LAD      (3.0/23mm Xience)  . CRANIOTOMY N/A 04/19/2019   Procedure: CRANIOTOMY HEMATOMA EVACUATION SUBDURAL;  Surgeon: Jovita Gamma, MD;  Location: [place];  Service: Neurosurgery;  Laterality: N/A;  CRANIOTOMY HEMATOMA EVACUATION SUBDURAL  . None     Family History:  Family History  Problem Relation Age of Onset  . Stroke Father        Died, 80s  . Stroke Sister        Living, 29  . Heart disease Mother        Died, 65  .  Healthy Daughter   . Diabetes Maternal Grandmother   . Hypertension Neg Hx    Family Psychiatric  History:  Social History:  Social History   Substance and Sexual Activity  Alcohol Use Not Currently   Comment: socially     Social History   Substance and Sexual Activity  Drug Use Never    Social History   Socioeconomic History  . Marital status: Married    Spouse name: Not on file  . Number of children: Not on file  . Years of education: Not on file  .  Highest education level: Not on file  Occupational History  . Not on file  Tobacco Use  . Smoking status: Former Smoker    Types: Cigarettes    Quit date: 08/24/1966    Years since quitting: 54.2  . Smokeless tobacco: Never Used  Substance and Sexual Activity  . Alcohol use: Not Currently    Comment: socially  . Drug use: Never  . Sexual activity: Not on file  Other Topics Concern  . Not on file  Social History Narrative   ** Merged History Encounter **       Currently works as a Midwife.  Lives with wife and they have two healthy daughters.   Social Determinants of Health   Financial Resource Strain: Not on file  Food Insecurity: Not on file  Transportation Needs: Not on file  Physical Activity: Not on file  Stress: Not on file  Social Connections: Not on file   Additional Social History:                         Sleep: Good  Appetite:  Good  Current Medications: Current Outpatient Medications  Medication Sig Dispense Refill  . acetaminophen (TYLENOL) 325 MG tablet Take 1-2 tablets (325-650 mg total) by mouth every 4 (four) hours as needed for mild pain or headache.    [name] acetaminophen (TYLENOL) 325 MG tablet Take 325-650 mg by mouth every 6 (six) hours as needed for mild pain or headache.    . ALPRAZolam (XANAX) 0.5 MG tablet Half q day  prn 25 tablet 3  . atorvastatin (LIPITOR) 40 MG tablet Take 1 tablet (40 mg total) by mouth daily at 6 PM. 30 tablet 3  . buPROPion (WELLBUTRIN SR) 100 MG 12 hr tablet Take two tablets (200 mg total) by mouth each morning. 60 tablet 4  . HYDROcodone-acetaminophen (NORCO) 10-325 MG tablet     . hydrOXYzine (ATARAX/VISTARIL) 25 MG tablet Take 1 tablet (25 mg total) by mouth at bedtime. 30 tablet 1  . levothyroxine (SYNTHROID) 88 MCG tablet Take 1 tablet (88 mcg total) by mouth daily. 90 tablet 1  . lidocaine (LIDODERM) 5 % Place 1 patch onto the skin daily. Remove & Discard patch within 12 hours or as directed by MD 30 patch  0  . LORazepam (ATIVAN) 0.5 MG tablet Take 1 tablet (0.5 mg total) by mouth at bedtime. 30 tablet 4  . ramipril (ALTACE) 1.25 MG capsule Take 1.25 mg by mouth daily after supper.    . venlafaxine XR (EFFEXOR-XR) 37.5 MG 24 hr capsule Take 1 capsule (37.5 mg total) by mouth daily. 1 qam 30 capsule 6   No current facility-administered medications for this visit.    Lab Results:  No results found for this or any previous visit (from the past 48 hour(s)).  Physical Findings: AIMS:  , ,  ,  ,  CIWA:    COWS:     Musculoskeletal: Strength & Muscle Tone: within normal limits Gait & Station: normal Patient leans: Right  Psychiatric Specialty Exam: ROS  There were no vitals taken for this visit.There is no height or weight on file to calculate BMI.  General Appearance: Negative  Eye Contact::  Good  Speech:  Clear and Coherent  Volume:  Normal  Mood:  NA  Affect:  Appropriate  Thought Process:  Coherent  Orientation:  Full (Time, Place, and Person)  Thought Content:  WDL  Suicidal Thoughts:  No  Homicidal Thoughts:  No  Memory:  NA  Judgement:  Good  Insight:  Good  Psychomotor Activity:  Normal  Concentration:  Good  Recall:  Good  Fund of Knowledge:Good  Language: Good  Akathisia:  No  Handed:  Right  AIMS (if indicated):     Assets:  Desire for Improvement  ADL's:  Intact  Cognition: WNL  Sleep:      Treatment Plan Summary: This patient's first problem is that of major depression.  He will continue taking Wellbutrin 150 mg and Effexor as prescribed.  His second problem is that of insomnia.  The patient is doing very well taking Restoril and will continue taking that.  He has episodic anxiety.  For that he takes Xanax 0.5 mg a half as needed.  I shared with him that he can also take Vistaril instead of the Xanax.  In reality I do not mind if he takes some Vistaril and Xanax both of them on a as needed basis.  Will stay on Restoril for sleep.  To be seen again in a few  months.  The patient actually is doing better than usual.

## 2020-11-12 ENCOUNTER — Ambulatory Visit: Payer: Medicare Other | Admitting: Physical Therapy

## 2020-11-12 ENCOUNTER — Other Ambulatory Visit: Payer: Self-pay

## 2020-11-12 DIAGNOSIS — R42 Dizziness and giddiness: Secondary | ICD-10-CM | POA: Diagnosis not present

## 2020-11-12 DIAGNOSIS — R293 Abnormal posture: Secondary | ICD-10-CM | POA: Diagnosis not present

## 2020-11-12 DIAGNOSIS — M6281 Muscle weakness (generalized): Secondary | ICD-10-CM | POA: Diagnosis not present

## 2020-11-12 DIAGNOSIS — R2681 Unsteadiness on feet: Secondary | ICD-10-CM

## 2020-11-12 DIAGNOSIS — R2689 Other abnormalities of gait and mobility: Secondary | ICD-10-CM

## 2020-11-12 NOTE — Therapy (Signed)
[facility] [address] [place], [place], [address] Phone: [phone]   Fax:  [phone]  Physical Therapy Treatment/Discharge Summary  Patient Details  Name: Benjamin Hahn MRN: [id number] Date of Birth: Apr 25, 1933 Referring Provider (PT): Berle Mull, MD   PHYSICAL THERAPY DISCHARGE SUMMARY  Visits from Start of Care: 9  Current functional level related to goals / functional outcomes: See LTGs below.  Pt has met 1 of 5 LTGs.   Remaining deficits: Balance, decreased safety awareness, decreased independence with gait   Education / Equipment: Educated in [employer], continued fitness, continued need to use RW due to fall risk.  Plan: Patient agrees to discharge.  Patient goals were not met. Patient is being discharged due to                                                     ?????maximizing rehab potential at this time.       Encounter Date: 11/12/2020   PT End of Session - 11/12/20 1000    Visit Number 10    Number of Visits 18    Date for PT Re-Evaluation 77/93/90   Recert completed 3/0/0923   Authorization Type Medicare    Progress Note Due on Visit --   completed at visit 6, due to recert   PT Start Time 0849    PT Stop Time 0931    PT Time Calculation (min) 42 min    Activity Tolerance Patient tolerated treatment well    Behavior During Therapy [facility] for tasks assessed/performed           Past Medical History:  Diagnosis Date  . Anxiety   . Arthritis    might be in back, no problems  . BPH (benign prostatic hyperplasia)   . Depression 1990s  . GERD (gastroesophageal reflux disease)    occasional  . Hypercholesteremia    controled  . Hypothyroid   . MI (myocardial infarction) ([place])   . Shingles May 2014   "Right face, still has some"  . Temporal arteritis ([place])   . Transient blindness of both eyes     Past Surgical History:  Procedure Laterality Date  . ARTERY BIOPSY Right 06/20/2013    Procedure: BIOPSY TEMPORAL ARTERY RIGHT;  Surgeon: Earnstine Regal, MD;  Location: [facility];  Service: General;  Laterality: Right;  . CARDIAC CATHETERIZATION N/A 09/02/2015   Procedure: Left Heart Cath and Coronary Angiography;  Surgeon: Charolette Forward, MD;  Location: [facility];  Service: Cardiovascular;  Laterality: N/A;  . CARDIAC CATHETERIZATION N/A 09/02/2015   Procedure: Coronary Stent Intervention;  Surgeon: Charolette Forward, MD;  Location: [facility];  Service: Cardiovascular;  Laterality: N/A;  1.  mid RCA      (3.0/28mm Xience) 2.  Mid LAD      (3.0/23mm Xience)  . CRANIOTOMY N/A 04/19/2019   Procedure: CRANIOTOMY HEMATOMA EVACUATION SUBDURAL;  Surgeon: Jovita Gamma, MD;  Location: [facility];  Service: Neurosurgery;  Laterality: N/A;  CRANIOTOMY HEMATOMA EVACUATION SUBDURAL  . None      There were no vitals filed for this visit.   Subjective Assessment - 11/12/20 0851    Subjective No falls, no changes since last week.  Pt wants to discharge this visit.    Patient is accompained by: Family member  daughter, Benjamin Hahn   Patient Stated Goals Patient:  Would like to graduate to cane if I could.  Wife:  want to make sure he's balanced and not falling; the walker is okay with me.    Currently in Pain? No/denies                  Reviewed HEP this visit, with pt needing cues for correct performance  Standing March with Counter Support - 1 x daily - 5 x weekly - 1 sets - 10 reps - 3 hold-cues for 3 sec hold Standing Hip Abduction with Counter Support - 1 x daily - 5 x weekly - 1 sets - 10 reps-cues for hold time Heel rises with counter support - 1 x daily - 5 x weekly - 3 sets - 10 reps (Recumbent Bike - 1 x daily - 3 x weekly - 5-7 minutes time)-pt performs once weekly at fitness center Standing Tandem Balance with Counter Support - 1 x daily - 5 x weekly - 1 sets - 3 reps - 30 hold-cues for posture, UE support for balance Standing Single Leg Stance with Counter Support -  1 x daily - 5 x weekly - 1 sets - 3 reps - 10 hold-cues for posture Mini Squat with Counter Support - 1 x daily - 5 x weekly - 3 sets - 10 reps-cues for technique and posture Step forward with counter support - 1 x daily - 5 x weekly - 1-2 sets - 10 reps Side Stepping with Counter Support - 1 x daily - 5 x weekly - 1-2 sets - 10 reps-cues for weightshift/technique Staggered Stance Forward Backward Weight Shift with Counter Support - 1 x daily - 5 x weekly - 1-2 sets - 10 reps  Additional performance of HEP with daughter at end of session (pt performs 3-5 reps of each exercise) Reprinted copies of HEP exercises along with ex chart for tracking.  Suggested pt make "appointment time" each day to do his exercises.            [facility] Adult PT Treatment/Exercise - 11/12/20 0001      Self-Care   Self-Care Other Self-Care Comments    Other Self-Care Comments  Educated pt and daughter (last part of session) in pt's fall risk with little change in [place] and TUG scores since last check in January.  Discussed his need to continue to use RW at all times and to be more consistent with performing HEP.  Discussed that he currently has enough exercises (daughter/wife had requested to give more) and that he needs family supervision/cueing to remember to do them correctly and consistently.  Discussed pt's progress towards goals (limited this POC due to longer gaps between consistent appoitnments-scheduling issues, and decreased consistency with ex).  Discussed what would warrant his return to therapy down the road.                  PT Education - 11/12/20 0959    Education Details See self-care-pt/daughter in agreement with d/c this visit.    Person(s) Educated Patient;Child(ren)    Methods Explanation;Demonstration;Handout;Verbal cues   Reprinted pt's HEP   Comprehension Verbalized understanding;Returned demonstration;Verbal cues required            PT Short Term Goals - 09/30/20 2022      PT  SHORT TERM GOAL #1   Title ---             PT Long Term Goals - 11/12/20 2035  PT LONG TERM GOAL #1   Title Pt will perform progression of HEP for improved balance, strength, and gait.    Baseline Reports performing at home; always needs cues in therapy session review; reveiwed/performed with daughter present and discussed exercise chart to help perform 5x/wk    Time 4    Period Weeks    Status Partially Met      PT LONG TERM GOAL #2   Title Pt will improve TUG score to less than or equal to 13.5 seconds for decreased fall risk.    Baseline 16 seconds with RW - improved but not to goal    Status Partially Met      PT LONG TERM GOAL #3   Title Pt will improve Berg score to at least 41/56 for decreased fall risk.    Baseline 35/56 no change since January    Status Not Met      PT LONG TERM GOAL #4   Title Pt will improve gait velocity to at least 2.62 ft/sec for improved gait efficiency and safety.    Baseline 1.8 ft/sec with RW    Status Not Met      PT LONG TERM GOAL #5   Title Pt will verbalize/demo understanding of continued community fitness plan to maximize functional gains made in PT.    Baseline continues to use bike at fitness center-can only fit in once per week    Time 4    Period Weeks    Status Achieved                 Plan - 11/12/20 1002    Clinical Impression Statement Pt reports no additional dizziness episodes, and he would like to discharge today (previously discussed with this therapist due to pt's scheduling/work).  Assessed remaining LTGs this visit, with pt partially meeting LTG 1 for HEP (needs consistent cues in therapy and daughter educated in HEP/consistency of performace today).  LTG 5 met, as pt plans to continue work on fitness machines in exercise room at club, likely once per week.  Overall, pt has not made gains in obejctive measures since last check in Jan/Feb of Berg/TUG scores.  He remains at fall risk and he continues to need  cues in therapy sessions for posture, correct performance of HEP and for foot clearance with gait.  Educated pt's daughter today in part of session on importance of consistency of exercises in HEP and continued need for using RW due to pt's continued fall risk.  She verbalizes understnading.  Pt is appropriate for d/c at this time.    Personal Factors and Comorbidities Comorbidity 3+;Time since onset of injury/illness/exacerbation    Comorbidities See PMH    Examination-Activity Limitations Locomotion Level;Transfers;Stand    Examination-Participation Restrictions Community Activity;Other   Community exercise; going out to eat   Stability/Clinical Decision Making Evolving/Moderate complexity    Rehab Potential Good    PT Frequency 1x / week    PT Duration 4 weeks   recert 02/28/2422   PT Treatment/Interventions ADLs/Self Care Home Management;DME Instruction;Gait training;Stair training;Functional mobility training;Therapeutic activities;Therapeutic exercise;Balance training;Neuromuscular re-education;Patient/family education    PT Next Visit Plan D/C this visit    PT Home Exercise Plan Access Code: [email or web address]    Consulted and Agree with Plan of Care Patient;Family member/caregiver    Family Member Consulted daughter, Benjamin Hahn           Patient will benefit from skilled therapeutic intervention in order to improve the following deficits and impairments:  Abnormal gait,Difficulty walking,Decreased safety awareness,Decreased balance,Decreased mobility,Decreased strength,Postural dysfunction  Visit Diagnosis: Unsteadiness on feet  Other abnormalities of gait and mobility     Problem List Patient Active Problem List   Diagnosis Date Noted  . Hyperglycemia 10/02/2020  . Bradycardia 10/02/2020  . Routine general medical examination at a health care facility 12/09/2019  . Psychophysiological insomnia 12/07/2019  . Deficiency anemia 06/01/2019  . Vitamin D deficiency 06/01/2019  . Chronic  idiopathic constipation 06/01/2019  . Mild intermittent asthma with acute exacerbation 02/08/2018  . Hypothyroidism 03/14/2013  . Hyperlipidemia LDL goal <70 03/14/2013  . BPH (benign prostatic hypertrophy) 03/14/2013  . Hypogonadism, male 03/14/2013  . Erectile dysfunction 03/14/2013    Frazier Butt. 11/12/2020, 10:08 AM  Frazier Butt., PT   [facility] [address] [place], [place], [address] Phone: [phone]   Fax:  [phone]  Name: Benjamin Hahn MRN: [id number] Date of Birth: 1933/01/04

## 2020-11-22 DIAGNOSIS — Z23 Encounter for immunization: Secondary | ICD-10-CM | POA: Diagnosis not present

## 2020-12-05 DIAGNOSIS — N5201 Erectile dysfunction due to arterial insufficiency: Secondary | ICD-10-CM | POA: Diagnosis not present

## 2020-12-05 DIAGNOSIS — R311 Benign essential microscopic hematuria: Secondary | ICD-10-CM | POA: Diagnosis not present

## 2020-12-05 DIAGNOSIS — N401 Enlarged prostate with lower urinary tract symptoms: Secondary | ICD-10-CM | POA: Diagnosis not present

## 2020-12-05 DIAGNOSIS — R3914 Feeling of incomplete bladder emptying: Secondary | ICD-10-CM | POA: Diagnosis not present

## 2020-12-12 ENCOUNTER — Other Ambulatory Visit (HOSPITAL_COMMUNITY): Payer: Self-pay | Admitting: *Deleted

## 2020-12-12 MED ORDER — ALPRAZOLAM 0.5 MG PO TABS: ORAL_TABLET | ORAL | 3 refills | Status: DC

## 2021-01-09 ENCOUNTER — Telehealth: Payer: Self-pay | Admitting: Internal Medicine

## 2021-01-09 NOTE — Telephone Encounter (Signed)
LVM for pt to rtn my call to schedule AWV with [facility]. Please schedule AWV if pt calls the office  

## 2021-01-09 NOTE — Progress Notes (Signed)
  Chronic Care Management   Outreach Note  01/09/2021 Name: WILLY PINKERTON MRN: [id number] DOB: [birth year]  Referred by: Janith Lima, MD Reason for referral : No chief complaint on file.   An unsuccessful telephone outreach was attempted today. The patient was referred to the pharmacist for assistance with care management and care coordination.   Follow Up Plan:   [place]

## 2021-01-23 ENCOUNTER — Ambulatory Visit (INDEPENDENT_AMBULATORY_CARE_PROVIDER_SITE_OTHER): Payer: Medicare Other

## 2021-01-23 ENCOUNTER — Other Ambulatory Visit: Payer: Self-pay

## 2021-01-23 VITALS — BP 102/60 | HR 80 | Temp 98.0°F | Ht 67.5 in | Wt 183.2 lb

## 2021-01-23 DIAGNOSIS — Z0000 Encounter for general adult medical examination without abnormal findings: Secondary | ICD-10-CM | POA: Diagnosis not present

## 2021-01-23 NOTE — Patient Instructions (Addendum)
Benjamin Hahn , Thank you for taking time to come for your Medicare Wellness Visit. I appreciate your ongoing commitment to your health goals. Please review the following plan we discussed and let me know if I can assist you in the future.   Screening recommendations/referrals: Colonoscopy: no repeat due to age Recommended yearly ophthalmology/optometry visit for glaucoma screening and checkup Recommended yearly dental visit for hygiene and checkup  Vaccinations: Influenza vaccine: 05/28/2020 Pneumococcal vaccine: 02/14/2014, 07/28/2018 Tdap vaccine: 11/28/2014; due every 10 years Shingles vaccine: never done; can check with local pharmacy for vaccine   Covid-19: 09/08/2019, 09/27/2019, 04/11/2020, 11/22/2020  Advanced directives: Please bring a copy of your health care power of attorney and living will to the office at your convenience.  Conditions/risks identified: Yes; Reviewed health maintenance screenings with patient today and relevant education, vaccines, and/or referrals were provided. Please continue to do your personal lifestyle choices by: daily care of teeth and gums, regular physical activity (goal should be 5 days a week for 30 minutes), eat a healthy diet, avoid tobacco and drug use, limiting any alcohol intake, taking a low-dose aspirin (if not allergic or have been advised by your provider otherwise) and taking vitamins and minerals as recommended by your provider. Continue doing brain stimulating activities (puzzles, reading, adult coloring books, staying active) to keep memory sharp. Continue to eat heart healthy diet (full of fruits, vegetables, whole grains, lean protein, water--limit salt, fat, and sugar intake) and increase physical activity as tolerated.  Next appointment: Please schedule your next Medicare Wellness Visit with your Nurse Health Advisor in 1 year by calling [phone]. Preventive Care 26 Years and Older, Male Preventive care refers to lifestyle choices and visits  with your health care provider that can promote health and wellness. What does preventive care include?  A yearly physical exam. This is also called an annual well check.  Dental exams once or twice a year.  Routine eye exams. Ask your health care provider how often you should have your eyes checked.  Personal lifestyle choices, including:  Daily care of your teeth and gums.  Regular physical activity.  Eating a healthy diet.  Avoiding tobacco and drug use.  Limiting alcohol use.  Practicing safe sex.  Taking low doses of aspirin every day.  Taking vitamin and mineral supplements as recommended by your health care provider. What happens during an annual well check? The services and screenings done by your health care provider during your annual well check will depend on your age, overall health, lifestyle risk factors, and family history of disease. Counseling  Your health care provider may ask you questions about your:  Alcohol use.  Tobacco use.  Drug use.  Emotional well-being.  Home and relationship well-being.  Sexual activity.  Eating habits.  History of falls.  Memory and ability to understand (cognition).  Work and work Statistician. Screening  You may have the following tests or measurements:  Height, weight, and BMI.  Blood pressure.  Lipid and cholesterol levels. These may be checked every 5 years, or more frequently if you are over 85 years old.  Skin check.  Lung cancer screening. You may have this screening every year starting at age 68 if you have a 30-pack-year history of smoking and currently smoke or have quit within the past 15 years.  Fecal occult blood test (FOBT) of the stool. You may have this test every year starting at age 50.  Flexible sigmoidoscopy or colonoscopy. You may have a sigmoidoscopy every 5 years  or a colonoscopy every 10 years starting at age 54.  Prostate cancer screening. Recommendations will vary depending on  your family history and other risks.  Hepatitis C blood test.  Hepatitis B blood test.  Sexually transmitted disease (STD) testing.  Diabetes screening. This is done by checking your blood sugar (glucose) after you have not eaten for a while (fasting). You may have this done every 1-3 years.  Abdominal aortic aneurysm (AAA) screening. You may need this if you are a current or former smoker.  Osteoporosis. You may be screened starting at age 28 if you are at high risk. Talk with your health care provider about your test results, treatment options, and if necessary, the need for more tests. Vaccines  Your health care provider may recommend certain vaccines, such as:  Influenza vaccine. This is recommended every year.  Tetanus, diphtheria, and acellular pertussis (Tdap, Td) vaccine. You may need a Td booster every 10 years.  Zoster vaccine. You may need this after age 45.  Pneumococcal 13-valent conjugate (PCV13) vaccine. One dose is recommended after age 71.  Pneumococcal polysaccharide (PPSV23) vaccine. One dose is recommended after age 34. Talk to your health care provider about which screenings and vaccines you need and how often you need them. This information is not intended to replace advice given to you by your health care provider. Make sure you discuss any questions you have with your health care provider. Document Released: 09/06/2015 Document Revised: 04/29/2016 Document Reviewed: 06/11/2015 Elsevier Interactive Patient Education  2017 [place] Prevention in the Home Falls can cause injuries. They can happen to people of all ages. There are many things you can do to make your home safe and to help prevent falls. What can I do on the outside of my home?  Regularly fix the edges of walkways and driveways and fix any cracks.  Remove anything that might make you trip as you walk through a door, such as a raised step or threshold.  Trim any bushes or trees on  the path to your home.  Use bright outdoor lighting.  Clear any walking paths of anything that might make someone trip, such as rocks or tools.  Regularly check to see if handrails are loose or broken. Make sure that both sides of any steps have handrails.  Any raised decks and porches should have guardrails on the edges.  Have any leaves, snow, or ice cleared regularly.  Use sand or salt on walking paths during winter.  Clean up any spills in your garage right away. This includes oil or grease spills. What can I do in the bathroom?  Use night lights.  Install grab bars by the toilet and in the tub and shower. Do not use towel bars as grab bars.  Use non-skid mats or decals in the tub or shower.  If you need to sit down in the shower, use a plastic, non-slip stool.  Keep the floor dry. Clean up any water that spills on the floor as soon as it happens.  Remove soap buildup in the tub or shower regularly.  Attach bath mats securely with double-sided non-slip rug tape.  Do not have throw rugs and other things on the floor that can make you trip. What can I do in the bedroom?  Use night lights.  Make sure that you have a light by your bed that is easy to reach.  Do not use any sheets or blankets that are too big for your  bed. They should not hang down onto the floor.  Have a firm chair that has side arms. You can use this for support while you get dressed.  Do not have throw rugs and other things on the floor that can make you trip. What can I do in the kitchen?  Clean up any spills right away.  Avoid walking on wet floors.  Keep items that you use a lot in easy-to-reach places.  If you need to reach something above you, use a strong step stool that has a grab bar.  Keep electrical cords out of the way.  Do not use floor polish or wax that makes floors slippery. If you must use wax, use non-skid floor wax.  Do not have throw rugs and other things on the floor that  can make you trip. What can I do with my stairs?  Do not leave any items on the stairs.  Make sure that there are handrails on both sides of the stairs and use them. Fix handrails that are broken or loose. Make sure that handrails are as long as the stairways.  Check any carpeting to make sure that it is firmly attached to the stairs. Fix any carpet that is loose or worn.  Avoid having throw rugs at the top or bottom of the stairs. If you do have throw rugs, attach them to the floor with carpet tape.  Make sure that you have a light switch at the top of the stairs and the bottom of the stairs. If you do not have them, ask someone to add them for you. What else can I do to help prevent falls?  Wear shoes that:  Do not have high heels.  Have rubber bottoms.  Are comfortable and fit you well.  Are closed at the toe. Do not wear sandals.  If you use a stepladder:  Make sure that it is fully opened. Do not climb a closed stepladder.  Make sure that both sides of the stepladder are locked into place.  Ask someone to hold it for you, if possible.  Clearly mark and make sure that you can see:  Any grab bars or handrails.  First and last steps.  Where the edge of each step is.  Use tools that help you move around (mobility aids) if they are needed. These include:  Canes.  Walkers.  Scooters.  Crutches.  Turn on the lights when you go into a dark area. Replace any light bulbs as soon as they burn out.  Set up your furniture so you have a clear path. Avoid moving your furniture around.  If any of your floors are uneven, fix them.  If there are any pets around you, be aware of where they are.  Review your medicines with your doctor. Some medicines can make you feel dizzy. This can increase your chance of falling. Ask your doctor what other things that you can do to help prevent falls. This information is not intended to replace advice given to you by your health care  provider. Make sure you discuss any questions you have with your health care provider. Document Released: 06/06/2009 Document Revised: 01/16/2016 Document Reviewed: 09/14/2014 Elsevier Interactive Patient Education  2017 [employer].

## 2021-01-23 NOTE — Progress Notes (Signed)
Subjective:   Benjamin Hahn is a 87 y.o. male who presents for Medicare Annual/Subsequent preventive examination.  Review of Systems    No ROS. Medicare Wellness Visit. Additional risk factors are reflected in social history. Cardiac Risk Factors include: advanced age (>42men, >58 women);dyslipidemia;family history of premature cardiovascular disease;male gender     Objective:    Today's Vitals   01/23/21 1541  BP: 102/60  Pulse: 80  Temp: 98 F (36.7 C)  SpO2: 93%  Weight: 183 lb 3.2 oz (83.1 kg)  Height: 5' 7.5" (1.715 m)  PainSc: 0-No pain   Body mass index is 28.27 kg/m.  Advanced Directives 01/23/2021 07/29/2020 08/15/2019 04/21/2019 04/19/2019 04/17/2019 11/04/2016  Does Patient Have a Medical Advance Directive? Yes Yes Yes No No No No  Type of Advance Directive - - [place];Living will - - - -  Does patient want to make changes to medical advance directive? Yes (MAU/Ambulatory/Procedural Areas - Information given) No - Patient declined No - Patient declined - - - -  Copy of [place] in Chart? - - - - - - -  Would patient like information on creating a medical advance directive? - - - Yes (Inpatient - patient defers creating a medical advance directive at this time - Information given) No - Patient declined No - Patient declined Yes (MAU/Ambulatory/Procedural Areas - Information given)  Pre-existing out of facility DNR order (yellow form or pink MOST form) - - - - - - -    Current Medications (verified) Outpatient Encounter Medications as of 01/23/2021  Medication Sig  . acetaminophen (TYLENOL) 325 MG tablet Take 1-2 tablets (325-650 mg total) by mouth every 4 (four) hours as needed for mild pain or headache.  . ALPRAZolam (XANAX) 0.5 MG tablet Half q day  prn  . atorvastatin (LIPITOR) 40 MG tablet Take 1 tablet (40 mg total) by mouth daily at 6 PM.  . buPROPion (WELLBUTRIN SR) 100 MG 12 hr tablet Take two tablets (200 mg total)  by mouth each morning.  [name] HYDROcodone-acetaminophen (NORCO) 10-325 MG tablet   . hydrOXYzine (ATARAX/VISTARIL) 25 MG tablet Take 1 tablet (25 mg total) by mouth at bedtime.  [name] levothyroxine (SYNTHROID) 88 MCG tablet Take 1 tablet (88 mcg total) by mouth daily.  [name] lidocaine (LIDODERM) 5 % Place 1 patch onto the skin daily. Remove & Discard patch within 12 hours or as directed by MD  . LORazepam (ATIVAN) 0.5 MG tablet Take 1 tablet (0.5 mg total) by mouth at bedtime.  . ramipril (ALTACE) 1.25 MG capsule Take 1.25 mg by mouth daily after supper.  . venlafaxine XR (EFFEXOR-XR) 37.5 MG 24 hr capsule Take 1 capsule (37.5 mg total) by mouth daily. 1 qam  . acetaminophen (TYLENOL) 325 MG tablet Take 325-650 mg by mouth every 6 (six) hours as needed for mild pain or headache.   No facility-administered encounter medications on file as of 01/23/2021.    Allergies (verified) Ativan [lorazepam] and Lorazepam   History: Past Medical History:  Diagnosis Date  . Anxiety   . Arthritis    might be in back, no problems  . BPH (benign prostatic hyperplasia)   . Depression 1990s  . GERD (gastroesophageal reflux disease)    occasional  . Hypercholesteremia    controled  . Hypothyroid   . MI (myocardial infarction) ([place])   . Shingles May 2014   "Right face, still has some"  . Temporal arteritis ([place])   . Transient  blindness of both eyes    Past Surgical History:  Procedure Laterality Date  . ARTERY BIOPSY Right 06/20/2013   Procedure: BIOPSY TEMPORAL ARTERY RIGHT;  Surgeon: Earnstine Regal, MD;  Location: [facility];  Service: General;  Laterality: Right;  . CARDIAC CATHETERIZATION N/A 09/02/2015   Procedure: Left Heart Cath and Coronary Angiography;  Surgeon: Charolette Forward, MD;  Location: [facility];  Service: Cardiovascular;  Laterality: N/A;  . CARDIAC CATHETERIZATION N/A 09/02/2015   Procedure: Coronary Stent Intervention;  Surgeon: Charolette Forward, MD;  Location: [facility];  Service:  Cardiovascular;  Laterality: N/A;  1.  mid RCA      (3.0/28mm Xience) 2.  Mid LAD      (3.0/23mm Xience)  . CRANIOTOMY N/A 04/19/2019   Procedure: CRANIOTOMY HEMATOMA EVACUATION SUBDURAL;  Surgeon: Jovita Gamma, MD;  Location: [facility];  Service: Neurosurgery;  Laterality: N/A;  CRANIOTOMY HEMATOMA EVACUATION SUBDURAL  . None     Family History  Problem Relation Age of Onset  . Stroke Father        Died, 80s  . Stroke Sister        Living, 90  . Heart disease Mother        Died, 73  . Healthy Daughter   . Diabetes Maternal Grandmother   . Hypertension Neg Hx    Social History   Socioeconomic History  . Marital status: Married    Spouse name: Not on file  . Number of children: Not on file  . Years of education: Not on file  . Highest education level: Not on file  Occupational History  . Not on file  Tobacco Use  . Smoking status: Former Smoker    Types: Cigarettes    Quit date: 08/24/1966    Years since quitting: 54.4  . Smokeless tobacco: Never Used  Substance and Sexual Activity  . Alcohol use: Not Currently    Comment: socially  . Drug use: Never  . Sexual activity: Not on file  Other Topics Concern  . Not on file  Social History Narrative   ** Merged History Encounter **       Currently works as a Midwife.  Lives with wife and they have two healthy daughters.   Social Determinants of Health   Financial Resource Strain: Low Risk   . Difficulty of Paying Living Expenses: Not hard at all  Food Insecurity: No Food Insecurity  . Worried About Charity fundraiser in the Last Year: Never true  . Ran Out of Food in the Last Year: Never true  Transportation Needs: No Transportation Needs  . Lack of Transportation (Medical): No  . Lack of Transportation (Non-Medical): No  Physical Activity: Inactive  . Days of Exercise per Week: 0 days  . Minutes of Exercise per Session: 0 min  Stress: No Stress Concern Present  . Feeling of Stress : Not at all  Social  Connections: Socially Integrated  . Frequency of Communication with Friends and Family: More than three times a week  . Frequency of Social Gatherings with Friends and Family: More than three times a week  . Attends Religious Services: More than 4 times per year  . Active Member of Clubs or Organizations: Yes  . Attends Archivist Meetings: More than 4 times per year  . Marital Status: Married    Tobacco Counseling Counseling given: Not Answered   Clinical Intake:  Pre-visit preparation completed: Yes  Pain : No/denies pain  Pain Score: 0-No pain     BMI - recorded: 28.27 Nutritional Status: BMI 25 -29 Overweight Nutritional Risks: None Diabetes: No  How often do you need to have someone help you when you read instructions, pamphlets, or other written materials from your doctor or pharmacy?: 1 - Never What is the last grade level you completed in school?: Civil Law Degree  Diabetic? no  Interpreter Needed?: No  Information entered by :: Lisette Abu, LPN   Activities of Daily Living In your present state of health, do you have any difficulty performing the following activities: 01/23/2021 10/02/2020  Hearing? N N  Vision? N N  Difficulty concentrating or making decisions? N N  Walking or climbing stairs? Y N  Dressing or bathing? Y N  Doing errands, shopping? Y N  Preparing Food and eating ? N -  Using the Toilet? N -  In the past six months, have you accidently leaked urine? N -  Do you have problems with loss of bowel control? N -  Managing your Medications? Y -  Managing your Finances? Y -  Housekeeping or managing your Housekeeping? Y -  Some recent data might be hidden    Patient Care Team: Janith Lima, MD as PCP - General (Internal Medicine) Janith Lima, MD (Internal Medicine)  Indicate any recent Medical Services you may have received from other than [facility] providers in the past year (date may be approximate).     Assessment:   This  is a routine wellness examination for Benjamin Hahn.  Hearing/Vision screen No exam data present  Dietary issues and exercise activities discussed: Current Exercise Habits: The patient does not participate in regular exercise at present, Exercise limited by: respiratory conditions(s);psychological condition(s);cardiac condition(s)  Goals Addressed   None    Depression Screen PHQ 2/9 Scores 01/23/2021 10/02/2020 12/07/2019 05/24/2019 07/28/2018 01/06/2018 11/04/2016  PHQ - 2 Score 0 0 1 0 3 4 3   PHQ- 9 Score - 0 7 2 13 13  -    Fall Risk Fall Risk  01/23/2021 10/02/2020 05/24/2019 07/28/2018 01/06/2018  Falls in the past year? 0 0 1 0 No  Comment - - last fall July 2020 - -  Number falls in past yr: 0 0 1 0 -  Injury with Fall? 0 0 - 0 -  Risk for fall due to : Impaired balance/gait - - Impaired mobility -  Follow up Falls evaluation completed - - Falls evaluation completed -    FALL RISK PREVENTION PERTAINING TO THE HOME:  Any stairs in or around the home? Yes  If so, are there any without handrails? No  Home free of loose throw rugs in walkways, pet beds, electrical cords, etc? Yes  Adequate lighting in your home to reduce risk of falls? Yes   ASSISTIVE DEVICES UTILIZED TO PREVENT FALLS:  Life alert? No  Use of a cane, walker or w/c? Yes  Grab bars in the bathroom? No  Shower chair or bench in shower? Yes  Elevated toilet seat or a handicapped toilet? No   TIMED UP AND GO:  Was the test performed? No .  Length of time to ambulate 10 feet: 0 sec.   Gait slow and steady with assistive device  Cognitive Function: Normal cognitive status assessed by direct observation by this Nurse Health Advisor. No abnormalities found.          Immunizations Immunization History  Administered Date(s) Administered  . Fluad Quad(high Dose 65+) 05/04/2019  . Influenza, High Dose  Seasonal PF 06/28/2014, 06/25/2016, 06/08/2017, 06/03/2018  . Influenza,inj,Quad PF,6+ Mos 08/11/2013  . PFIZER(Purple  Top)SARS-COV-2 Vaccination 09/08/2019, 09/27/2019, 04/11/2020, 11/22/2020  . Pneumococcal Conjugate-13 02/14/2014  . Pneumococcal Polysaccharide-23 07/28/2018  . Tdap 11/28/2014  . Zoster, Live 02/01/2010    TDAP status: Up to date  Flu Vaccine status: Up to date  Pneumococcal vaccine status: Up to date  Covid-19 vaccine status: Completed vaccines  Qualifies for Shingles Vaccine? Yes   Zostavax completed Yes   Shingrix Completed?: No.    Education has been provided regarding the importance of this vaccine. Patient has been advised to call insurance company to determine out of pocket expense if they have not yet received this vaccine. Advised may also receive vaccine at local pharmacy or Health Dept. Verbalized acceptance and understanding.  Screening Tests Health Maintenance  Topic Date Due  . Zoster Vaccines- Shingrix (1 of 2) Never done  . INFLUENZA VACCINE  03/24/2021  . TETANUS/TDAP  11/27/2024  . COVID-19 Vaccine  Completed  . PNA vac Low Risk Adult  Completed  . HPV VACCINES  Aged Out    Health Maintenance  Health Maintenance Due  Topic Date Due  . Zoster Vaccines- Shingrix (1 of 2) Never done    Colorectal cancer screening: No longer required.   Lung Cancer Screening: (Low Dose CT Chest recommended if Age 30-80 years, 30 pack-year currently smoking OR have quit w/in 15years.) does not qualify.   Lung Cancer Screening Referral: no  Additional Screening:  Hepatitis C Screening: does not qualify; Completed no  Vision Screening: Recommended annual ophthalmology exams for early detection of glaucoma and other disorders of the eye. Is the patient up to date with their annual eye exam?  Yes  Who is the provider or what is the name of the office in which the patient attends annual eye exams? Marica Otter, OD. If pt is not established with a provider, would they like to be referred to a provider to establish care? No .   Dental Screening: Recommended annual dental  exams for proper oral hygiene  Community Resource Referral / Chronic Care Management: CRR required this visit?  No   CCM required this visit?  No      Plan:     I have personally reviewed and noted the following in the patient's chart:   . Medical and social history . Use of alcohol, tobacco or illicit drugs  . Current medications and supplements including opioid prescriptions. Patient is currently taking opioid prescriptions. Information provided to patient regarding non-opioid alternatives. Patient advised to discuss non-opioid treatment plan with their provider. . Functional ability and status . Nutritional status . Physical activity . Advanced directives . List of other physicians . Hospitalizations, surgeries, and ER visits in previous 12 months . Vitals . Screenings to include cognitive, depression, and falls . Referrals and appointments  In addition, I have reviewed and discussed with patient certain preventive protocols, quality metrics, and best practice recommendations. A written personalized care plan for preventive services as well as general preventive health recommendations were provided to patient.     Sheral Flow, LPN   12/26/7320   Nurse Notes: n/a

## 2021-02-11 ENCOUNTER — Other Ambulatory Visit: Payer: Self-pay

## 2021-02-11 ENCOUNTER — Telehealth (INDEPENDENT_AMBULATORY_CARE_PROVIDER_SITE_OTHER): Payer: Medicare Other | Admitting: Psychiatry

## 2021-02-11 DIAGNOSIS — F325 Major depressive disorder, single episode, in full remission: Secondary | ICD-10-CM

## 2021-02-11 MED ORDER — BUPROPION HCL ER (SR) 100 MG PO TB12: ORAL_TABLET | ORAL | 4 refills | Status: DC

## 2021-02-11 MED ORDER — ALPRAZOLAM 0.5 MG PO TABS
ORAL_TABLET | ORAL | 3 refills | Status: DC
Start: 2021-02-11 — End: 2021-02-11

## 2021-02-11 MED ORDER — LORAZEPAM 0.5 MG PO TABS: 0.5000 mg | ORAL_TABLET | Freq: Every day | ORAL | 4 refills | Status: DC

## 2021-02-11 MED ORDER — ALPRAZOLAM 0.5 MG PO TABS: ORAL_TABLET | ORAL | 3 refills | Status: DC

## 2021-02-11 MED ORDER — VENLAFAXINE HCL ER 37.5 MG PO CP24: 37.5000 mg | ORAL_CAPSULE | Freq: Every day | ORAL | 6 refills | Status: DC

## 2021-02-11 NOTE — Progress Notes (Signed)
Patient ID: Benjamin Hahn, male   DOB: 01/25/1933, 87 y.o.   MRN: [id number] [facility] MD/PA/NP OP Progress Note  02/11/2021 2:06 PM Benjamin Hahn  MRN:  [id number]  Chief Complaint: Major depression remission Subjective: Doing great  Today the patient is doing very well.  He continues to work a few hours every day.  He is happy with his work.  He uses the computer and uses his cell phone without problem.  His mood is very stable.  He is now sleeping without a problem.  He takes his medicines just as prescribed.  He denies daily depression.  His a good mood no anxiety and is very focused.  No problems thinking or concentrating.  He watches TV and reads a little.  He is getting along better with his wife.  His biggest complaint is the fact that he has great difficulty walking.  He no longer can drive and when he walks he needs a walker.  He does have some episodic falls but does not hurt himself.  His cognitive abilities seem to be pretty stable at this time.  He seems to be functioning fairly well.  Today we talked about the possibility of having a glass of wine with dinner.  I shared with him that I think that is a reasonable thing to do but no more than 1 glass of wine and not late at night.  The patient is reasonably optimistic.  He has no wishes to retire.  We will continue all medications as prescribed and return to see me in 3 months.  Past Medical History:  Diagnosis Date   Anxiety    Arthritis    might be in back, no problems   BPH (benign prostatic hyperplasia)    Depression 1990s   GERD (gastroesophageal reflux disease)    occasional   Hypercholesteremia    controled   Hypothyroid    MI (myocardial infarction) [facility])    Shingles May 2014   "Right face, still has some"   Temporal arteritis ([place])    Transient blindness of both eyes     Past Surgical History:  Procedure Laterality Date   ARTERY BIOPSY Right 06/20/2013   Procedure: BIOPSY TEMPORAL ARTERY RIGHT;  Surgeon: Earnstine Regal, MD;  [facility];  Service: General;  Laterality: Right;   CARDIAC CATHETERIZATION N/A 09/02/2015   Procedure: Left Heart Cath and Coronary Angiography;  Surgeon: Charolette Forward, MD;  Location: [place] [facility];  Service: Cardiovascular;  Laterality: N/A;   CARDIAC CATHETERIZATION N/A 09/02/2015   Procedure: Coronary Stent Intervention;  Surgeon: Charolette Forward, MD;  Location: [place] [facility];  Service: Cardiovascular;  Laterality: N/A;  1.  mid RCA      (3.0/28mm Xience) 2.  Mid LAD      (3.0/23mm Xience)   CRANIOTOMY N/A 04/19/2019   Procedure: CRANIOTOMY HEMATOMA EVACUATION SUBDURAL;  Surgeon: Jovita Gamma, MD;  Location: [place];  Service: Neurosurgery;  Laterality: N/A;  CRANIOTOMY HEMATOMA EVACUATION SUBDURAL   None      Family Psychiatric History:   Family History:  Family History  Problem Relation Age of Onset   Stroke Father        Died, 87s   Stroke Sister        Living, 70   Heart disease Mother        Died, 70   Healthy Daughter    Diabetes Maternal Grandmother    Hypertension Neg Hx     Social History:  Social History   Socioeconomic History   Marital status: Married    Spouse name: Not on file   Number of children: Not on file   Years of education: Not on file   Highest education level: Not on file  Occupational History   Not on file  Tobacco Use   Smoking status: Former    Pack years: 0.00    Types: Cigarettes    Quit date: 08/24/1966    Years since quitting: 54.5   Smokeless tobacco: Never  Substance and Sexual Activity   Alcohol use: Not Currently    Comment: socially   Drug use: Never   Sexual activity: Not on file  Other Topics Concern   Not on file  Social History Narrative   ** Merged History Encounter **       Currently works as a Midwife.  Lives with wife and they have two healthy daughters.   Social Determinants of Health   Financial Resource Strain: Low Risk    Difficulty of Paying Living Expenses: Not hard at all   Food Insecurity: No Food Insecurity   Worried About Charity fundraiser in the Last Year: Never true   [place] in the Last Year: Never true  Transportation Needs: No Transportation Needs   Lack of Transportation (Medical): No   Lack of Transportation (Non-Medical): No  Physical Activity: Inactive   Days of Exercise per Week: 0 days   Minutes of Exercise per Session: 0 min  Stress: No Stress Concern Present   Feeling of Stress : Not at all  Social Connections: Socially Integrated   Frequency of Communication with Friends and Family: More than three times a week   Frequency of Social Gatherings with Friends and Family: More than three times a week   Attends Religious Services: More than 4 times per year   Active Member of [employer] or Organizations: Yes   Attends Archivist Meetings: More than 4 times per year   Marital Status: Married    Allergies:  Allergies  Allergen Reactions   Ativan [Lorazepam] Anxiety    Per other chart in Epic   Lorazepam Anxiety    Metabolic Disorder Labs: Lab Results  Component Value Date   HGBA1C 5.5 04/14/2019   No results found for: PROLACTIN Lab Results  Component Value Date   CHOL 147 10/02/2020   TRIG 55.0 10/02/2020   HDL 79.30 10/02/2020   CHOLHDL 2 10/02/2020   VLDL 11.0 10/02/2020   LDLCALC 57 10/02/2020   LDLCALC 51 06/01/2019     Current Medications: Current Outpatient Medications  Medication Sig Dispense Refill   acetaminophen (TYLENOL) 325 MG tablet Take 1-2 tablets (325-650 mg total) by mouth every 4 (four) hours as needed for mild pain or headache.     acetaminophen (TYLENOL) 325 MG tablet Take 325-650 mg by mouth every 6 (six) hours as needed for mild pain or headache.     ALPRAZolam (XANAX) 0.5 MG tablet Half q day  prn 25 tablet 3   atorvastatin (LIPITOR) 40 MG tablet Take 1 tablet (40 mg total) by mouth daily at 6 PM. 30 tablet 3   buPROPion (WELLBUTRIN SR) 100 MG 12 hr tablet Take two tablets (200 mg  total) by mouth each morning. 60 tablet 4   HYDROcodone-acetaminophen (NORCO) 10-325 MG tablet      hydrOXYzine (ATARAX/VISTARIL) 25 MG tablet Take 1 tablet (25 mg total) by mouth at bedtime. 30 tablet 1   levothyroxine (SYNTHROID)  88 MCG tablet Take 1 tablet (88 mcg total) by mouth daily. 90 tablet 1   lidocaine (LIDODERM) 5 % Place 1 patch onto the skin daily. Remove & Discard patch within 12 hours or as directed by MD 30 patch 0   LORazepam (ATIVAN) 0.5 MG tablet Take 1 tablet (0.5 mg total) by mouth at bedtime. 30 tablet 4   ramipril (ALTACE) 1.25 MG capsule Take 1.25 mg by mouth daily after supper.     venlafaxine XR (EFFEXOR-XR) 37.5 MG 24 hr capsule Take 1 capsule (37.5 mg total) by mouth daily. 1 qam 30 capsule 6   No current facility-administered medications for this visit.    Neurologic: Headache: No Seizure: No Paresthesias: No  Musculoskeletal: Strength & Muscle Tone: within normal limits Gait & Station: normal Patient leans: NA  Psychiatric Specialty Exam: ROS  There were no vitals taken for this visit.There is no height or weight on file to calculate BMI.  General Appearance: Fairly Groomed  Eye Contact:  Good  Speech:  Clear and Coherent  Volume:  Normal  Mood:  Euthymic  Affect:  Appropriate  Thought Process:  Coherent  Orientation:  Full (Time, Place, and Person)  Thought Content:  WDL  Suicidal Thoughts:  No  Homicidal Thoughts:  No  Memory:  NA  Judgement:  Good  Insight:  Good  Psychomotor Activity:  Normal  Concentration:  Good  Recall:  Good  Fund of Knowledge: Good  Language: Good  Akathisia:  No  Handed:  Right  AIMS (if indicated):    Assets:  Desire for Improvement  ADL's:  Intact  Cognition: WNL  Sleep:      Treatment/plan  Once again for a second visit now he is doing well.  He is stable.  He has major clinical depression.  He takes 37.5 mg of Effexor and Wellbutrin 100 mg slow release 2 in the morning.  These seem to meet his needs.   He is not anhedonic.  He is sleeping well.  His second problem is insomnia.  For insomnia he takes Restoril 15 mg alternating with Ativan 0.5 mg.  He is very clear to understand that he should not take both of these agents together.  Generally he will take the Ativan every night and then after a few weeks if it is not working well we will switch to Restoril.  This seems to be as needs.  Often at 1:00 in the morning will awaken and at that time it is reasonable it will take his Xanax.  He will take a half of 0.5 mg.  Therefore essentially he will take 0.25 mg as needed.  Likely he will need it most nights.  The combination of these medicines seem to work well for him.  We will speak again in 3 months. 02/11/2021, 2:06 PM [facility] MD Progress Note  02/11/2021 2:06 PM Benjamin Hahn  MRN:  [id number] Subjective:  Feels well Principal Problem: Major depression, chronic, mild/residual Diagnosis:  Major depression, recurrent residual Today the patient is doing fairly well. He's been having some medical problems. He recently went for Thanksgiving to a length but unfortunately had a urinary tract infection. He also since then has been coughing and recently started on antibiotic. Emotionally he is fairly stable. He denies daily depression. He stays very active at work. He's been unable to exercise. The patient is sleeping well and has an increase in his appetite. His energy is good. Is no problems thinking or concentrating. He is recently  having a productive cough but is no shortness of breath. He denies any chest pain. Is a stable relationship with his wife. His kidneys are good. He has 4 grandchildren. Everybody in his home is good. He only issue is that a number mornings he awakens and he feels oversedated. It occurs point of his wife feels uncomfortable with him driving will taken to work. Today reviewed his medications and is evident that he likely needs reduction. He also notes that his memory has change in his  and is good. 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 Patient Active Problem List   Diagnosis Date Noted   Hyperglycemia [R73.9] 10/02/2020   Bradycardia [R00.1] 10/02/2020   Routine general medical examination at a health care facility [Z00.00] 12/09/2019   Psychophysiological insomnia [F51.04] 12/07/2019   Deficiency anemia [D53.9] 06/01/2019   Vitamin D deficiency [E55.9] 06/01/2019   Chronic idiopathic constipation [K59.04] 06/01/2019   Mild intermittent asthma with acute exacerbation [J45.21] 02/08/2018   Hypothyroidism [E03.9] 03/14/2013   Hyperlipidemia LDL goal <70 [E78.5] 03/14/2013   BPH (benign prostatic hypertrophy) [N40.0] 03/14/2013   Hypogonadism, male [E29.1] 03/14/2013   Erectile dysfunction [N52.9] 03/14/2013   Total Time spent with patient: 30 minutes  Past Psychiatric History:   Past Medical History:  Past Medical History:  Diagnosis Date   Anxiety    Arthritis    might be in back, no problems   BPH (benign prostatic hyperplasia)    Depression 1990s   GERD (gastroesophageal reflux disease)    occasional   Hypercholesteremia    controled   Hypothyroid    MI (myocardial infarction) [facility])    Shingles May 2014   "Right face, still has some"   Temporal arteritis ([place])    Transient blindness of both eyes     Past Surgical History:  Procedure Laterality Date   ARTERY BIOPSY Right 06/20/2013   Procedure: BIOPSY TEMPORAL ARTERY RIGHT;  Surgeon: Earnstine Regal, MD;  [facility];  Service: General;  Laterality: Right;   CARDIAC CATHETERIZATION N/A 09/02/2015   Procedure: Left Heart Cath and Coronary Angiography;  Surgeon: Charolette Forward, MD;  Location: [place] [facility];  Service: Cardiovascular;  Laterality: N/A;   CARDIAC CATHETERIZATION N/A 09/02/2015   Procedure: Coronary Stent Intervention;  Surgeon: Charolette Forward, MD;  Location: [place] [facility];  Service: Cardiovascular;  Laterality: N/A;  1.  mid  RCA      (3.0/28mm Xience) 2.  Mid LAD      (3.0/23mm Xience)   CRANIOTOMY N/A 04/19/2019   Procedure: CRANIOTOMY HEMATOMA EVACUATION SUBDURAL;  Surgeon: Jovita Gamma, MD;  Location: [place];  Service: Neurosurgery;  Laterality: N/A;  CRANIOTOMY HEMATOMA EVACUATION SUBDURAL   None     Family History:  Family History  Problem Relation Age of Onset   Stroke Father        Died, 49s   Stroke Sister        Living, 65   Heart disease Mother        Died, 67   Healthy Daughter    Diabetes Maternal Grandmother    Hypertension Neg Hx    Family Psychiatric  History:  Social History:  Social History   Substance and Sexual Activity  Alcohol Use Not Currently   Comment: socially     Social History   Substance and Sexual Activity  Drug Use Never    Social History   Socioeconomic History  Marital status: Married    Spouse name: Not on file   Number of children: Not on file   Years of education: Not on file   Highest education level: Not on file  Occupational History   Not on file  Tobacco Use   Smoking status: Former    Pack years: 0.00    Types: Cigarettes    Quit date: 08/24/1966    Years since quitting: 54.5   Smokeless tobacco: Never  Substance and Sexual Activity   Alcohol use: Not Currently    Comment: socially   Drug use: Never   Sexual activity: Not on file  Other Topics Concern   Not on file  Social History Narrative   ** Merged History Encounter **       Currently works as a Midwife.  Lives with wife and they have two healthy daughters.   Social Determinants of Health   Financial Resource Strain: Low Risk    Difficulty of Paying Living Expenses: Not hard at all  Food Insecurity: No Food Insecurity   Worried About Charity fundraiser in the Last Year: Never true   [place] in the Last Year: Never true  Transportation Needs: No Transportation Needs   Lack of Transportation (Medical): No   Lack of Transportation (Non-Medical): No  Physical  Activity: Inactive   Days of Exercise per Week: 0 days   Minutes of Exercise per Session: 0 min  Stress: No Stress Concern Present   Feeling of Stress : Not at all  Social Connections: Socially Integrated   Frequency of Communication with Friends and Family: More than three times a week   Frequency of Social Gatherings with Friends and Family: More than three times a week   Attends Religious Services: More than 4 times per year   Active Member of [employer] or Organizations: Yes   Attends Music therapist: More than 4 times per year   Marital Status: Married   Additional Social History:                         Sleep: Good  Appetite:  Good  Current Medications: Current Outpatient Medications  Medication Sig Dispense Refill   acetaminophen (TYLENOL) 325 MG tablet Take 1-2 tablets (325-650 mg total) by mouth every 4 (four) hours as needed for mild pain or headache.     acetaminophen (TYLENOL) 325 MG tablet Take 325-650 mg by mouth every 6 (six) hours as needed for mild pain or headache.     ALPRAZolam (XANAX) 0.5 MG tablet Half q day  prn 25 tablet 3   atorvastatin (LIPITOR) 40 MG tablet Take 1 tablet (40 mg total) by mouth daily at 6 PM. 30 tablet 3   buPROPion (WELLBUTRIN SR) 100 MG 12 hr tablet Take two tablets (200 mg total) by mouth each morning. 60 tablet 4   HYDROcodone-acetaminophen (NORCO) 10-325 MG tablet      hydrOXYzine (ATARAX/VISTARIL) 25 MG tablet Take 1 tablet (25 mg total) by mouth at bedtime. 30 tablet 1   levothyroxine (SYNTHROID) 88 MCG tablet Take 1 tablet (88 mcg total) by mouth daily. 90 tablet 1   lidocaine (LIDODERM) 5 % Place 1 patch onto the skin daily. Remove & Discard patch within 12 hours or as directed by MD 30 patch 0   LORazepam (ATIVAN) 0.5 MG tablet Take 1 tablet (0.5 mg total) by mouth at bedtime. 30 tablet 4   ramipril (ALTACE) 1.25  MG capsule Take 1.25 mg by mouth daily after supper.     venlafaxine XR (EFFEXOR-XR) 37.5 MG 24 hr  capsule Take 1 capsule (37.5 mg total) by mouth daily. 1 qam 30 capsule 6   No current facility-administered medications for this visit.    Lab Results:  No results found for this or any previous visit (from the past 48 hour(s)).  Physical Findings: AIMS:  , ,  ,  ,    CIWA:    COWS:     Musculoskeletal: Strength & Muscle Tone: within normal limits Gait & Station: normal Patient leans: Right  Psychiatric Specialty Exam: ROS  There were no vitals taken for this visit.There is no height or weight on file to calculate BMI.  General Appearance: Negative  Eye Contact::  Good  Speech:  Clear and Coherent  Volume:  Normal  Mood:  NA  Affect:  Appropriate  Thought Process:  Coherent  Orientation:  Full (Time, Place, and Person)  Thought Content:  WDL  Suicidal Thoughts:  No  Homicidal Thoughts:  No  Memory:  NA  Judgement:  Good  Insight:  Good  Psychomotor Activity:  Normal  Concentration:  Good  Recall:  Good  Fund of Knowledge:Good  Language: Good  Akathisia:  No  Handed:  Right  AIMS (if indicated):     Assets:  Desire for Improvement  ADL's:  Intact  Cognition: WNL  Sleep:      [facility] MD Progress Note  02/11/2021 2:06 PM Benjamin Hahn  MRN:  [id number] Subjective:  Feels well Principal Problem: Major depression, chronic, mild/residual Diagnosis:  Major depression, recurrent residual Today the patient is doing fairly well. He's been having some medical problems. He recently went for Thanksgiving to a length but unfortunately had a urinary tract infection. He also since then has been coughing and recently started on antibiotic. Emotionally he is fairly stable. He denies daily depression. He stays very active at work. He's been unable to exercise. The patient is sleeping well and has an increase in his appetite. His energy is good. Is no problems thinking or concentrating. He is recently having a productive cough but is no shortness of breath. He  denies any chest pain. Is a stable relationship with his wife. His kidneys are good. He has 4 grandchildren. Everybody in his home is good. He only issue is that a number mornings he awakens and he feels oversedated. It occurs point of his wife feels uncomfortable with him driving will taken to work. Today reviewed his medications and is evident that he likely needs reduction. He also notes that his memory has change in his and is good. 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 Patient Active Problem List   Diagnosis Date Noted   Hyperglycemia [R73.9] 10/02/2020   Bradycardia [R00.1] 10/02/2020   Routine general medical examination at a health care facility [Z00.00] 12/09/2019   Psychophysiological insomnia [F51.04] 12/07/2019   Deficiency anemia [D53.9] 06/01/2019   Vitamin D deficiency [E55.9] 06/01/2019   Chronic idiopathic constipation [K59.04] 06/01/2019   Mild intermittent asthma with acute exacerbation [J45.21] 02/08/2018   Hypothyroidism [E03.9] 03/14/2013   Hyperlipidemia LDL goal <70 [E78.5] 03/14/2013   BPH (benign prostatic hypertrophy) [N40.0] 03/14/2013   Hypogonadism, male [E29.1] 03/14/2013   Erectile dysfunction [N52.9] 03/14/2013   Total Time spent with patient: 30 minutes  Past Psychiatric History:   Past Medical History:  Past Medical History:  Diagnosis Date   Anxiety    Arthritis    might be in back, no problems   BPH (benign prostatic hyperplasia)    Depression 1990s   GERD (gastroesophageal reflux disease)    occasional   Hypercholesteremia    controled   Hypothyroid    MI (myocardial infarction) [facility])    Shingles May 2014   "Right face, still has some"   Temporal arteritis ([place])    Transient blindness of both eyes     Past Surgical History:  Procedure Laterality Date   ARTERY BIOPSY Right 06/20/2013   Procedure: BIOPSY TEMPORAL ARTERY RIGHT;  Surgeon: Earnstine Regal, MD;  [facility];  Service:  General;  Laterality: Right;   CARDIAC CATHETERIZATION N/A 09/02/2015   Procedure: Left Heart Cath and Coronary Angiography;  Surgeon: Charolette Forward, MD;  Location: [place] [facility];  Service: Cardiovascular;  Laterality: N/A;   CARDIAC CATHETERIZATION N/A 09/02/2015   Procedure: Coronary Stent Intervention;  Surgeon: Charolette Forward, MD;  Location: [place] [facility];  Service: Cardiovascular;  Laterality: N/A;  1.  mid RCA      (3.0/28mm Xience) 2.  Mid LAD      (3.0/23mm Xience)   CRANIOTOMY N/A 04/19/2019   Procedure: CRANIOTOMY HEMATOMA EVACUATION SUBDURAL;  Surgeon: Jovita Gamma, MD;  Location: [place];  Service: Neurosurgery;  Laterality: N/A;  CRANIOTOMY HEMATOMA EVACUATION SUBDURAL   None     Family History:  Family History  Problem Relation Age of Onset   Stroke Father        Died, 74s   Stroke Sister        Living, 34   Heart disease Mother        Died, 65   Healthy Daughter    Diabetes Maternal Grandmother    Hypertension Neg Hx    Family Psychiatric  History:  Social History:  Social History   Substance and Sexual Activity  Alcohol Use Not Currently   Comment: socially     Social History   Substance and Sexual Activity  Drug Use Never    Social History   Socioeconomic History   Marital status: Married    Spouse name: Not on file   Number of children: Not on file   Years of education: Not on file   Highest education level: Not on file  Occupational History   Not on file  Tobacco Use   Smoking status: Former    Pack years: 0.00    Types: Cigarettes    Quit date: 08/24/1966    Years since quitting: 54.5   Smokeless tobacco: Never  Substance and Sexual Activity   Alcohol use: Not Currently    Comment: socially   Drug use: Never   Sexual activity: Not on file  Other Topics Concern   Not on file  Social History Narrative   ** Merged History Encounter **       Currently works as a Midwife.  Lives with wife and they have two healthy daughters.    Social Determinants of Health   Financial Resource Strain: Low Risk    Difficulty of Paying Living Expenses: Not hard at all  Food Insecurity: No Food Insecurity   Worried About Charity fundraiser in the Last Year: Never true   [place] in the Last Year: Never true  Transportation Needs: No Transportation Needs   Lack of Transportation (Medical): No   Lack of Transportation (Non-Medical): No  Physical Activity: Inactive  Days of Exercise per Week: 0 days   Minutes of Exercise per Session: 0 min  Stress: No Stress Concern Present   Feeling of Stress : Not at all  Social Connections: Socially Integrated   Frequency of Communication with Friends and Family: More than three times a week   Frequency of Social Gatherings with Friends and Family: More than three times a week   Attends Religious Services: More than 4 times per year   Active Member of [employer] or Organizations: Yes   Attends Music therapist: More than 4 times per year   Marital Status: Married   Additional Social History:                         Sleep: Good  Appetite:  Good  Current Medications: Current Outpatient Medications  Medication Sig Dispense Refill   acetaminophen (TYLENOL) 325 MG tablet Take 1-2 tablets (325-650 mg total) by mouth every 4 (four) hours as needed for mild pain or headache.     acetaminophen (TYLENOL) 325 MG tablet Take 325-650 mg by mouth every 6 (six) hours as needed for mild pain or headache.     ALPRAZolam (XANAX) 0.5 MG tablet Half q day  prn 25 tablet 3   atorvastatin (LIPITOR) 40 MG tablet Take 1 tablet (40 mg total) by mouth daily at 6 PM. 30 tablet 3   buPROPion (WELLBUTRIN SR) 100 MG 12 hr tablet Take two tablets (200 mg total) by mouth each morning. 60 tablet 4   HYDROcodone-acetaminophen (NORCO) 10-325 MG tablet      hydrOXYzine (ATARAX/VISTARIL) 25 MG tablet Take 1 tablet (25 mg total) by mouth at bedtime. 30 tablet 1   levothyroxine (SYNTHROID) 88  MCG tablet Take 1 tablet (88 mcg total) by mouth daily. 90 tablet 1   lidocaine (LIDODERM) 5 % Place 1 patch onto the skin daily. Remove & Discard patch within 12 hours or as directed by MD 30 patch 0   LORazepam (ATIVAN) 0.5 MG tablet Take 1 tablet (0.5 mg total) by mouth at bedtime. 30 tablet 4   ramipril (ALTACE) 1.25 MG capsule Take 1.25 mg by mouth daily after supper.     venlafaxine XR (EFFEXOR-XR) 37.5 MG 24 hr capsule Take 1 capsule (37.5 mg total) by mouth daily. 1 qam 30 capsule 6   No current facility-administered medications for this visit.    Lab Results:  No results found for this or any previous visit (from the past 48 hour(s)).  Physical Findings: AIMS:  , ,  ,  ,    CIWA:    COWS:     Musculoskeletal: Strength & Muscle Tone: within normal limits Gait & Station: normal Patient leans: Right  Psychiatric Specialty Exam: ROS  There were no vitals taken for this visit.There is no height or weight on file to calculate BMI.  General Appearance: Negative  Eye Contact::  Good  Speech:  Clear and Coherent  Volume:  Normal  Mood:  NA  Affect:  Appropriate  Thought Process:  Coherent  Orientation:  Full (Time, Place, and Person)  Thought Content:  WDL  Suicidal Thoughts:  No  Homicidal Thoughts:  No  Memory:  NA  Judgement:  Good  Insight:  Good  Psychomotor Activity:  Normal  Concentration:  Good  Recall:  Good  Fund of Knowledge:Good  Language: Good  Akathisia:  No  Handed:  Right  AIMS (if indicated):     Assets:  Desire  for Improvement  ADL's:  Intact  Cognition: WNL  Sleep:      Treatment Plan Summary: This patient's first problem is that of major depression.  He will continue taking Wellbutrin 150 mg and Effexor as prescribed.  His second problem is that of insomnia.  The patient is doing very well taking Restoril and will continue taking that.  He has episodic anxiety.  For that he takes Xanax 0.5 mg a half as needed.  I shared with him that he can  also take Vistaril instead of the Xanax.  In reality I do not mind if he takes some Vistaril and Xanax both of them on a as needed basis.  Will stay on Restoril for sleep.  To be seen again in a few months.  The patient actually is doing better than usual.

## 2021-02-13 DIAGNOSIS — M542 Cervicalgia: Secondary | ICD-10-CM | POA: Diagnosis not present

## 2021-02-13 DIAGNOSIS — M25511 Pain in right shoulder: Secondary | ICD-10-CM | POA: Diagnosis not present

## 2021-03-03 ENCOUNTER — Telehealth: Payer: Self-pay | Admitting: Internal Medicine

## 2021-03-03 NOTE — Chronic Care Management (AMB) (Signed)
  Chronic Care Management   Note  03/03/2021 Name: AFNAN EMBERTON MRN: [id number] DOB: 10-08-1932  JUNIE ENGRAM is a 87 y.o. year old male who is a primary care patient of Janith Lima, MD. I reached out to [name] by phone today in response to a referral sent by Mr. Cristopher Estimable Macias's PCP, Janith Lima, MD.   Mr. Palau was given information about Chronic Care Management services today including:  CCM service includes personalized support from designated clinical staff supervised by his physician, including individualized plan of care and coordination with other care providers 24/7 contact phone numbers for assistance for urgent and routine care needs. Service will only be billed when office clinical staff spend 20 minutes or more in a month to coordinate care. Only one practitioner may furnish and bill the service in a calendar month. The patient may stop CCM services at any time (effective at the end of the month) by phone call to the office staff.   Patient agreed to services and verbal consent obtained.   Follow up plan:   Lauretta Grill [employer] Scheduler

## 2021-03-07 DIAGNOSIS — E785 Hyperlipidemia, unspecified: Secondary | ICD-10-CM | POA: Diagnosis not present

## 2021-03-07 DIAGNOSIS — I25118 Atherosclerotic heart disease of native coronary artery with other forms of angina pectoris: Secondary | ICD-10-CM | POA: Diagnosis not present

## 2021-03-07 DIAGNOSIS — N189 Chronic kidney disease, unspecified: Secondary | ICD-10-CM | POA: Diagnosis not present

## 2021-03-07 DIAGNOSIS — R2689 Other abnormalities of gait and mobility: Secondary | ICD-10-CM | POA: Diagnosis not present

## 2021-03-07 DIAGNOSIS — I10 Essential (primary) hypertension: Secondary | ICD-10-CM | POA: Diagnosis not present

## 2021-03-07 DIAGNOSIS — J45909 Unspecified asthma, uncomplicated: Secondary | ICD-10-CM | POA: Diagnosis not present

## 2021-03-25 ENCOUNTER — Other Ambulatory Visit: Payer: Self-pay | Admitting: Internal Medicine

## 2021-03-25 DIAGNOSIS — E039 Hypothyroidism, unspecified: Secondary | ICD-10-CM

## 2021-03-27 ENCOUNTER — Telehealth: Payer: Self-pay

## 2021-03-27 NOTE — Chronic Care Management (AMB) (Signed)
    Chronic Care Management Pharmacy Assistant   Name: MIKOS ENTER  MRN: [id number] DOB: [birth year]  Benjamin Hahn is an 87 y.o. year old male who presents for his initial CCM visit with the clinical pharmacist.  Recent office visits:  10/02/20-Thomas Evalina Field, MD (PCP) General follow up. Labs ordered. Start on levothyroxine (SYNTHROID) 88 MCG tablet. Follow up in 3 months.  Recent consult visits:  02/11/21-Geral Plovsky, MD [facility]) Notes not available. 11/12/20-Amy Minerva Ends, PT (Outpatient Rehabilitation) 11/05/20-Geral Casimiro Needle, MD [facility]) Notes not available. 11/04/20-Audra F. Melrose Nakayama, PT (Outpatient Rehabilitation) 10/28/20-Amy Minerva Ends, PT (Outpatient Rehabilitation) 10/07/20-Amy Minerva Ends, PT (Outpatient Rehabilitation) 09/30/20-Amy Minerva Ends, PT (Outpatient Rehabilitation) Hospital visits:  None in previous 6 months  Medications: Outpatient Encounter Medications as of 03/27/2021  Medication Sig   acetaminophen (TYLENOL) 325 MG tablet Take 1-2 tablets (325-650 mg total) by mouth every 4 (four) hours as needed for mild pain or headache.   acetaminophen (TYLENOL) 325 MG tablet Take 325-650 mg by mouth every 6 (six) hours as needed for mild pain or headache.   ALPRAZolam (XANAX) 0.5 MG tablet Half q day  prn   atorvastatin (LIPITOR) 40 MG tablet Take 1 tablet (40 mg total) by mouth daily at 6 PM.   buPROPion (WELLBUTRIN SR) 100 MG 12 hr tablet Take two tablets (200 mg total) by mouth each morning.   HYDROcodone-acetaminophen (NORCO) 10-325 MG tablet    hydrOXYzine (ATARAX/VISTARIL) 25 MG tablet Take 1 tablet (25 mg total) by mouth at bedtime.   levothyroxine (SYNTHROID) 88 MCG tablet Take 1 tablet (88 mcg total) by mouth daily.   lidocaine (LIDODERM) 5 % Place 1 patch onto the skin daily. Remove & Discard patch within 12 hours or as directed by MD   LORazepam (ATIVAN) 0.5 MG tablet Take 1 tablet (0.5 mg total) by mouth at bedtime.    ramipril (ALTACE) 1.25 MG capsule Take 1.25 mg by mouth daily after supper.   venlafaxine XR (EFFEXOR-XR) 37.5 MG 24 hr capsule Take 1 capsule (37.5 mg total) by mouth daily. 1 qam   No facility-administered encounter medications on file as of 03/27/2021.   Acetaminophen (TYLENOL) 325 MG tablet Last filled:None noted ALPRAZolam (XANAX) 0.5 MG tablet Last filled:03/20/21 50 DS Atorvastatin (LIPITOR) 40 MG tablet Last filled:03/05/21 30 DS BuPROPion (WELLBUTRIN SR) 100 MG 12 hr tablet Last filled:03/05/21 30 DS HYDROcodone-acetaminophen (NORCO) 10-325 MG tablet Last filled:06/05/20 5 DS HydrOXYzine (ATARAX/VISTARIL) 25 MG tablet Last filled:06/13/20 30 DS Levothyroxine (SYNTHROID) 88 MCG tablet Last filled:03/05/21 30 DS Lidocaine (LIDODERM) 5 % Last filled:None noted LORazepam (ATIVAN) 0.5 MG tablet Last filled:03/08/21 30 DS Ramipril (ALTACE) 1.25 MG capsule Last filled:03/05/21 30 DS Venlafaxine XR (EFFEXOR-XR) 37.5 MG 24 hr capsule Last filled:03/05/21 30 DS   Star Rating Drugs: Atorvastatin (LIPITOR) 40 MG tablet Last filled:03/05/21 30 DS  Myriam Elta [place], [place]

## 2021-04-07 ENCOUNTER — Other Ambulatory Visit (HOSPITAL_COMMUNITY): Payer: Self-pay

## 2021-04-07 MED ORDER — TEMAZEPAM 15 MG PO CAPS: 15.0000 mg | ORAL_CAPSULE | Freq: Every evening | ORAL | 1 refills | Status: DC | PRN

## 2021-04-09 ENCOUNTER — Ambulatory Visit (INDEPENDENT_AMBULATORY_CARE_PROVIDER_SITE_OTHER): Payer: Medicare Other

## 2021-04-09 ENCOUNTER — Other Ambulatory Visit: Payer: Self-pay

## 2021-04-09 DIAGNOSIS — E039 Hypothyroidism, unspecified: Secondary | ICD-10-CM

## 2021-04-09 DIAGNOSIS — E785 Hyperlipidemia, unspecified: Secondary | ICD-10-CM | POA: Diagnosis not present

## 2021-04-09 DIAGNOSIS — N400 Enlarged prostate without lower urinary tract symptoms: Secondary | ICD-10-CM | POA: Diagnosis not present

## 2021-04-09 DIAGNOSIS — F5104 Psychophysiologic insomnia: Secondary | ICD-10-CM

## 2021-04-09 NOTE — Patient Instructions (Signed)
Visit Information   PATIENT GOALS:   Goals Addressed             This Visit's Progress    Cope with Chronic Pain       Timeframe:  Long-Range Goal Priority:  High Start Date:   04/09/2021                          Expected End Date:  10/09/2021                    Follow Up Date 10/09/2021   - learn relaxation techniques - practice acceptance of chronic pain - practice relaxation or meditation daily - spend time with positive people - use relaxation during pain    Why is this important?   Stress makes chronic pain feel worse.  Feelings like depression, anxiety, stress and anger can make your body more sensitive to pain.  Learning ways to cope with stress or depression may help you find some relief from the pain.       Manage My Medicine       Timeframe:  Long-Range Goal Priority:  High Start Date:   04/09/2021                         Expected End Date:  10/09/2021                     Follow Up Date 10/08/2021   - keep a list of all the medicines I take; vitamins and herbals too - learn to read medicine labels - use a pillbox to sort medicine - use an alarm clock or phone to remind me to take my medicine    Why is this important?   These steps will help you keep on track with your medicines.        Consent to CCM Services: Benjamin Hahn was given information about Chronic Care Management services including:  CCM service includes personalized support from designated clinical staff supervised by his physician, including individualized plan of care and coordination with other care providers 24/7 contact phone numbers for assistance for urgent and routine care needs. Service will only be billed when office clinical staff spend 20 minutes or more in a month to coordinate care. Only one practitioner may furnish and bill the service in a calendar month. The patient may stop CCM services at any time (effective at the end of the month) by phone call to the office staff. The  patient will be responsible for cost sharing (co-pay) of up to 20% of the service fee (after annual deductible is met).  Patient agreed to services and verbal consent obtained.   Patient verbalizes understanding of instructions provided today and agrees to view in [place].   Telephone follow up appointment with care management team member scheduled for: 6 months The patient has been provided with contact information for the care management team and has been advised to call with any health related questions or concerns.   Tomasa Blase, PharmD Clinical Pharmacist, [place]    CLINICAL CARE PLAN: Patient Care Plan: CCM Care Plan     Problem Identified: HTN, HLD, Depression, Insomnia, Hypothyroidism, BPH, Chronic Pain   Priority: High  Onset Date: 04/09/2021     Goal: Patient-Specific Goal   Start Date: 04/09/2021  Expected End Date: 10/10/2021  This Visit's Progress: On track  Priority: High  Note:  Hypertension/ Previous MI (BP goal <130/80) -Controlled -Current treatment: Ramipril 1.69m - 1 tablet daily  -Medications previously tried: lisinopril, metoprolol succinate   -Current home readings: has not been monitoring at home BP Readings from Last 3 Encounters:  01/23/21 102/60  10/07/20 122/64  10/02/20 118/62  -Current dietary habits: unable to assess due to patient's schedule today  -Current exercise habits: unable to assess at this time -Denies hypotensive/hypertensive symptoms -Educated on BP goals and benefits of medications for prevention of heart attack, stroke and kidney damage; -Counseled to monitor for signs of hyper/hypotension and reach out to office with any issues or concerns -Recommended to continue current medication   Hyperlipidemia: (LDL goal < 70) -Controlled Lab Results  Component Value Date   LDLCALC 57 10/02/2020  -Current treatment: Atorvastatin 464m- 1 tablet daily  -Medications previously tried: n/a  -Current dietary  patterns: unable to assess at this time -Current exercise habits: unable to assess at this time -Educated on Cholesterol goals;  Benefits of statin for ASCVD risk reduction; -Recommended to continue current medication  Depression/Anxiety/ Insomnia (Goal: Promotion of mood / prevention and control of anxiety / promotion of quality sleep) -Controlled -Current treatment: Lorazepam 0.41m10m 1 tablet at bedtime  Alprazolam 0.41mg27m1 tablet daily if needed  Venlafaxine XR 37.41mg 52m capsule daily  Bupropion SR 100mg 20mtablets daily  Hydroxyzine 241mg -541mablet at bedtime  Temazepam 141mg - 70mpsule at bedtime  -Medications previously tried/failed: bupropion XL, lunesta, imipramine, ramelton,  -PHQ9: unable to assess at this time -Follow with Dr. Plovsky Casimiro Needletal health support -Educated on Benefits of medication for symptom control Benefits of cognitive-behavioral therapy with or without medication -Counseled on interaction or lorazepam, alprazolam, and temazepam, patient reports that he alternates the medication that he takes at bedtime, never takes two of these medications in the same day, aware of interaction between the medications and risks associated  Hypothyroidism (Goal: Maintenance of euthyroid levels) -Controlled Lab Results  Component Value Date   TSH 2.24 10/02/2020  -Current treatment  Levothyroxine 88mcg - 49mblet daily  -Medications previously tried: n/a  -Recommended to continue current medication  Chronic Shoulder Pain (Goal: Pain contorl) -Controlled -Current treatment  Acetaminophen 3241mg - 1-11mblet every 4 hours as needed  Celecoxib 100mg - 1 c2mle twice daily  -Medications previously tried: hydrocodone, percocet, naproxen  -Recommended to continue current medication  BPH (Goal: Prevention of disease progression / control of urinary symptoms) -Controlled -Current treatment  Tamsulosin 0.4mg - 2 cap29mes twice daily  -Medications previously tried: n/a   -Recommended to continue current medication  Patient Goals/Self-Care Activities Patient will:  - take medications as prescribed  Follow Up Plan: Telephone follow up appointment with care management team member scheduled for: The patient has been provided with contact information for the care management team and has been advised to call with any health related questions or concerns.

## 2021-04-09 NOTE — Progress Notes (Signed)
Chronic Care Management Pharmacy Note  04/09/2021 Name:  Benjamin Hahn MRN:  [id number] DOB:  09/18/1932  Summary:  - Patient reports that he feels he is doing well, has no issues or concerns with his current medications at this time - Last office blood pressures in range and at goal, LDL, TSH levels at goal, patient reports that BPH symptoms are controlled, chronic shoulder pain controlled with celecoxib -Following with Dr. Casimiro Needle for depression and insomnia - feels this is under control at this time   Recommendations/Changes made from today's visit: -Recommending no changes at this time, patient has follow up with PCP 04/24/21  Subjective: Benjamin Hahn is an 87 y.o. year old male who is a primary patient of Janith Lima, MD.  The CCM team was consulted for assistance with disease management and care coordination needs.    Engaged with patient by telephone for initial visit in response to provider referral for pharmacy case management and/or care coordination services.   Consent to Services:  The patient was given the following information about Chronic Care Management services today, agreed to services, and gave verbal consent: 1. CCM service includes personalized support from designated clinical staff supervised by the primary care provider, including individualized plan of care and coordination with other care providers 2. 24/7 contact phone numbers for assistance for urgent and routine care needs. 3. Service will only be billed when office clinical staff spend 20 minutes or more in a month to coordinate care. 4. Only one practitioner may furnish and bill the service in a calendar month. 5.The patient may stop CCM services at any time (effective at the end of the month) by phone call to the office staff. 6. The patient will be responsible for cost sharing (co-pay) of up to 20% of the service fee (after annual deductible is met). Patient agreed to services and consent  obtained.  Patient Care Team: Janith Lima, MD as PCP - General (Internal Medicine) Janith Lima, MD (Internal Medicine) Marica Otter, [place] as Consulting Physician (Optometry) Indira Sorenson, Darnelle Maffucci, [facility] as Pharmacist (Pharmacist)  Recent office visits:  10/02/20-Thomas Evalina Field, MD (PCP) General follow up. Labs ordered. Adjust levothyroxine (SYNTHROID) dose, now 88 MCG tablet. Discontinue trulance, metoprolol, and bupropion. Follow up in 3 months.    Recent consult visits:  02/11/21-Geral Plovsky, MD [facility]) Notes not available. 11/12/20-Amy Minerva Ends, PT (Outpatient Rehabilitation) 11/05/20-Geral Casimiro Needle, MD [facility]) Notes not available. 11/04/20-Audra F. Melrose Nakayama, PT (Outpatient Rehabilitation) 10/28/20-Amy Minerva Ends, PT (Outpatient Rehabilitation) 10/07/20-Amy Minerva Ends, PT (Outpatient Rehabilitation) 09/30/20-Amy Minerva Ends, PT (Outpatient Rehabilitation) Hospital visits:  None in previous 6 months  Objective:  Lab Results  Component Value Date   CREATININE 1.24 10/02/2020   BUN 22 10/02/2020   GFR 52.96 (L) 10/02/2020   GFRNONAA 45 (L) 10/11/2019   GFRAA 53 (L) 10/11/2019   NA 140 10/02/2020   K 4.2 10/02/2020   CALCIUM 9.2 10/02/2020   CO2 30 10/02/2020   GLUCOSE 71 10/02/2020    Lab Results  Component Value Date/Time   HGBA1C 5.5 04/14/2019 03:25 PM   HGBA1C 5.9 11/01/2017 09:22 AM   GFR 52.96 (L) 10/02/2020 04:15 PM   GFR 50.25 (L) 12/07/2019 03:05 PM    Last diabetic Eye exam:  No results found for: HMDIABEYEEXA  Last diabetic Foot exam:  No results found for: HMDIABFOOTEX   Lab Results  Component Value Date   CHOL 147 10/02/2020   HDL 79.30 10/02/2020   LDLCALC 57  10/02/2020   TRIG 55.0 10/02/2020   CHOLHDL 2 10/02/2020    Hepatic Function Latest Ref Rng & Units 10/02/2020 12/07/2019 10/11/2019  Total Protein 6.0 - 8.3 g/dL 6.7 6.0 6.0(L)  Albumin 3.5 - 5.2 g/dL 4.1 3.9 3.6  AST 0 - 37 U/L 20 15 22   ALT 0 - 53 U/L 23 13  22   Alk Phosphatase 39 - 117 U/L 50 50 44  Total Bilirubin 0.2 - 1.2 mg/dL 0.5 0.4 0.6  Bilirubin, Direct 0.0 - 0.3 mg/dL 0.1 0.1 -    Lab Results  Component Value Date/Time   TSH 2.24 10/02/2020 04:15 PM   TSH 3.08 12/07/2019 03:05 PM   FREET4 0.89 11/01/2017 09:22 AM   FREET4 0.91 03/03/2017 08:27 AM    CBC Latest Ref Rng & Units 10/02/2020 12/07/2019 10/11/2019  WBC 4.0 - 10.5 K/uL 7.5 6.0 -  Hemoglobin 13.0 - 17.0 g/dL 11.7(L) 10.7(L) 10.9(L)  Hematocrit 39.0 - 52.0 % 34.5(L) 31.9(L) 32.0(L)  Platelets 150.0 - 400.0 K/uL 233.0 201.0 -    Lab Results  Component Value Date/Time   VD25OH 37.56 06/01/2019 02:59 PM   VD25OH 28.47 (L) 11/01/2017 09:22 AM    Clinical ASCVD:Yes  The ASCVD Risk score Mikey Bussing DC Jr., et al., 2013) failed to calculate for the following reasons:   The 2013 ASCVD risk score is only valid for ages 46 to 3    Depression screen PHQ 2/9 01/23/2021 10/02/2020 12/07/2019  Decreased Interest 0 0 0  Down, Depressed, Hopeless 0 0 1  PHQ - 2 Score 0 0 1  Altered sleeping - 0 3  Tired, decreased energy - 0 1  Change in appetite - 0 0  Feeling bad or failure about yourself  - 0 1  Trouble concentrating - 0 1  Moving slowly or fidgety/restless - 0 0  Suicidal thoughts - 0 0  PHQ-9 Score - 0 7  Difficult doing work/chores - - -  Some recent data might be hidden    Social History   Tobacco Use  Smoking Status Former   Types: Cigarettes   Quit date: 08/24/1966   Years since quitting: 54.6  Smokeless Tobacco Never   BP Readings from Last 3 Encounters:  01/23/21 102/60  10/07/20 122/64  10/02/20 118/62   Pulse Readings from Last 3 Encounters:  01/23/21 80  10/02/20 (!) 52  12/07/19 (!) 57   Wt Readings from Last 3 Encounters:  01/23/21 183 lb 3.2 oz (83.1 kg)  10/02/20 186 lb 3.2 oz (84.5 kg)  12/07/19 180 lb (81.6 kg)   BMI Readings from Last 3 Encounters:  01/23/21 28.27 kg/m  10/02/20 28.73 kg/m  12/07/19 27.78 kg/m     Assessment/Interventions: Review of patient past medical history, allergies, medications, health status, including review of consultants reports, laboratory and other test data, was performed as part of comprehensive evaluation and provision of chronic care management services.   SDOH:  (Social Determinants of Health) assessments and interventions performed: Yes  SDOH Screenings   Alcohol Screen: Low Risk    Last Alcohol Screening Score (AUDIT): 1  Depression (PHQ2-9): Low Risk    PHQ-2 Score: 0  Financial Resource Strain: Low Risk    Difficulty of Paying Living Expenses: Not hard at all  Food Insecurity: No Food Insecurity   Worried About Charity fundraiser in the Last Year: Never true   Ran Out of Food in the Last Year: Never true  Housing: [place]  Score: 0  Physical Activity: Inactive   Days of Exercise per Week: 0 days   Minutes of Exercise per Session: 0 min  Social Connections: Socially Integrated   Frequency of Communication with Friends and Family: More than three times a week   Frequency of Social Gatherings with Friends and Family: More than three times a week   Attends Religious Services: More than 4 times per year   Active Member of [employer] or Organizations: Yes   Attends Music therapist: More than 4 times per year   Marital Status: Married  Stress: No Stress Concern Present   Feeling of Stress : Not at all  Tobacco Use: Medium Risk   Smoking Tobacco Use: Former   Smokeless Tobacco Use: Never  Transportation Needs: No Data processing manager (Medical): No   Lack of Transportation (Non-Medical): No    CCM Care Plan  No Active Allergies   Medications Reviewed Today     Reviewed by Norma Fredrickson, MD (Physician) on 02/11/21 at 1411  Med List Status: <None>   Medication Order Taking? Sig Documenting Provider Last Dose Status Informant  acetaminophen (TYLENOL) 325 MG tablet [id number] No Take 1-2  tablets (325-650 mg total) by mouth every 4 (four) hours as needed for mild pain or headache. Cathlyn Parsons, PA-C Taking Active   acetaminophen (TYLENOL) 325 MG tablet [id number] No Take 325-650 mg by mouth every 6 (six) hours as needed for mild pain or headache. [provider] Taking Active Spouse/Significant Other  ALPRAZolam Duanne Moron) 0.5 MG tablet [id number]  Half q day  prn Norma Fredrickson, MD  Active   atorvastatin (LIPITOR) 40 MG tablet [id number] No Take 1 tablet (40 mg total) by mouth daily at 6 PM. Angiulli, Lavon Paganini, PA-C Taking Active   buPROPion [facility] SR) 100 MG 12 hr tablet [id number]  Take two tablets (200 mg total) by mouth each morning. Norma Fredrickson, MD  Active   HYDROcodone-acetaminophen [facility]) 10-325 MG tablet [id number] No  [provider] Taking Active   hydrOXYzine (ATARAX/VISTARIL) 25 MG tablet [id number] No Take 1 tablet (25 mg total) by mouth at bedtime. Norma Fredrickson, MD Taking Active   levothyroxine (SYNTHROID) 88 MCG tablet [id number] No Take 1 tablet (88 mcg total) by mouth daily. Janith Lima, MD Taking Active   lidocaine (LIDODERM) 5 % [id number] No Place 1 patch onto the skin daily. Remove & Discard patch within 12 hours or as directed by MD Angiulli, Lavon Paganini, PA-C Taking Active   LORazepam (ATIVAN) 0.5 MG tablet [id number]  Take 1 tablet (0.5 mg total) by mouth at bedtime. Norma Fredrickson, MD  Active   ramipril (ALTACE) 1.25 MG capsule [id number] No Take 1.25 mg by mouth daily after supper. [provider] Taking Active Spouse/Significant Other  venlafaxine XR (EFFEXOR-XR) 37.5 MG 24 hr capsule [id number]  Take 1 capsule (37.5 mg total) by mouth daily. 1 qam Norma Fredrickson, MD  Active             Patient Active Problem List   Diagnosis Date Noted   Hyperglycemia 10/02/2020   Bradycardia 10/02/2020   Routine general medical examination at a health care facility 12/09/2019   Psychophysiological insomnia 12/07/2019    Deficiency anemia 06/01/2019   Vitamin D deficiency 06/01/2019   Chronic idiopathic constipation 06/01/2019   Mild intermittent asthma with acute exacerbation 02/08/2018   Hypothyroidism 03/14/2013   Hyperlipidemia LDL goal <70 03/14/2013   BPH (benign prostatic hypertrophy) 03/14/2013   Hypogonadism, male  03/14/2013   Erectile dysfunction 03/14/2013    Immunization History  Administered Date(s) Administered   Fluad Quad(high Dose 65+) 05/04/2019   Influenza, High Dose Seasonal PF 06/28/2014, 06/25/2016, 06/08/2017, 06/03/2018   Influenza,inj,Quad PF,6+ Mos 08/11/2013   PFIZER(Purple Top)SARS-COV-2 Vaccination 09/08/2019, 09/27/2019, 04/11/2020, 11/22/2020   Pneumococcal Conjugate-13 02/14/2014   Pneumococcal Polysaccharide-23 07/28/2018   Tdap 11/28/2014   Zoster, Live 02/01/2010    Conditions to be addressed/monitored:  Hypertension, Hyperlipidemia, Hypothyroidism, Depression, Osteoarthritis, BPH, and insomnia  Care Plan : CCM Care Plan  Updates made by Tomasa Blase, [place] since 04/09/2021 12:00 AM     Problem: HTN, HLD, Depression, Insomnia, Hypothyroidism, BPH, Chronic Pain   Priority: High  Onset Date: 04/09/2021     Goal: Patient-Specific Goal   Start Date: 04/09/2021  Expected End Date: 10/10/2021  This Visit's Progress: On track  Priority: High  Note:   Hypertension/ Previous MI (BP goal <130/80) -Controlled -Current treatment: Ramipril 1.87m - 1 tablet daily  -Medications previously tried: lisinopril, metoprolol succinate   -Current home readings: has not been monitoring at home BP Readings from Last 3 Encounters:  01/23/21 102/60  10/07/20 122/64  10/02/20 118/62  -Current dietary habits: unable to assess due to patient's schedule today  -Current exercise habits: unable to assess at this time -Denies hypotensive/hypertensive symptoms -Educated on BP goals and benefits of medications for prevention of heart attack, stroke and kidney damage; -Counseled to  monitor for signs of hyper/hypotension and reach out to office with any issues or concerns -Recommended to continue current medication   Hyperlipidemia: (LDL goal < 70) -Controlled Lab Results  Component Value Date   LDLCALC 57 10/02/2020  -Current treatment: Atorvastatin 417m- 1 tablet daily  -Medications previously tried: n/a  -Current dietary patterns: unable to assess at this time -Current exercise habits: unable to assess at this time -Educated on Cholesterol goals;  Benefits of statin for ASCVD risk reduction; -Recommended to continue current medication  Depression/Anxiety/ Insomnia (Goal: Promotion of mood / prevention and control of anxiety / promotion of quality sleep) -Controlled -Current treatment: Lorazepam 0.48m83m 1 tablet at bedtime  Alprazolam 0.48mg44m1 tablet daily if needed  Venlafaxine XR 37.48mg 22m capsule daily  Bupropion SR 100mg 38mtablets daily  Hydroxyzine 248mg -46mablet at bedtime  Temazepam 148mg - 36mpsule at bedtime  -Medications previously tried/failed: bupropion XL, lunesta, imipramine, ramelton,  -PHQ9: unable to assess at this time -Follow with Dr. Plovsky Casimiro Needletal health support -Educated on Benefits of medication for symptom control Benefits of cognitive-behavioral therapy with or without medication -Counseled on interaction or lorazepam, alprazolam, and temazepam, patient reports that he alternates the medication that he takes at bedtime, never takes two of these medications in the same day, aware of interaction between the medications and risks associated  Hypothyroidism (Goal: Maintenance of euthyroid levels) -Controlled Lab Results  Component Value Date   TSH 2.24 10/02/2020  -Current treatment  Levothyroxine 88mcg - 36mblet daily  -Medications previously tried: n/a  -Recommended to continue current medication  Chronic Shoulder Pain (Goal: Pain contorl) -Controlled -Current treatment  Acetaminophen 3248mg - 1-51mblet every 4  hours as needed  Celecoxib 100mg - 1 c22mle twice daily  -Medications previously tried: hydrocodone, percocet, naproxen  -Recommended to continue current medication  BPH (Goal: Prevention of disease progression / control of urinary symptoms) -Controlled -Current treatment  Tamsulosin 0.4mg - 2 cap3mes twice daily  -Medications previously tried: n/a  -Recommended to continue current  medication  Patient Goals/Self-Care Activities Patient will:  - take medications as prescribed  Follow Up Plan: Telephone follow up appointment with care management team member scheduled for: The patient has been provided with contact information for the care management team and has been advised to call with any health related questions or concerns.        Medication Assistance: None required.  Patient affirms current coverage meets needs.  Patient's preferred pharmacy is:  [place], [place] [address] Phone: [phone] Fax: [phone]   Uses pill box? Not assessed with appointment today Pt endorses 100% compliance  Care Plan and Follow Up Patient Decision:  Patient agrees to Care Plan and Follow-up.  Plan: Telephone follow up appointment with care management team member scheduled for:  6 months and The patient has been provided with contact information for the care management team and has been advised to call with any health related questions or concerns.   Tomasa Blase, PharmD Clinical Pharmacist, [place]

## 2021-04-24 ENCOUNTER — Ambulatory Visit: Payer: Medicare Other | Admitting: Internal Medicine

## 2021-05-14 ENCOUNTER — Telehealth (HOSPITAL_COMMUNITY): Payer: Medicare Other | Admitting: Psychiatry

## 2021-05-22 DIAGNOSIS — Z23 Encounter for immunization: Secondary | ICD-10-CM | POA: Diagnosis not present

## 2021-05-30 ENCOUNTER — Telehealth (HOSPITAL_BASED_OUTPATIENT_CLINIC_OR_DEPARTMENT_OTHER): Payer: Medicare Other | Admitting: Psychiatry

## 2021-05-30 ENCOUNTER — Telehealth: Payer: Self-pay | Admitting: Internal Medicine

## 2021-05-30 ENCOUNTER — Other Ambulatory Visit: Payer: Self-pay

## 2021-05-30 DIAGNOSIS — F325 Major depressive disorder, single episode, in full remission: Secondary | ICD-10-CM | POA: Diagnosis not present

## 2021-05-30 MED ORDER — ALPRAZOLAM 0.5 MG PO TABS: 0.2500 mg | ORAL_TABLET | Freq: Every evening | ORAL | 3 refills | Status: DC | PRN

## 2021-05-30 MED ORDER — BUPROPION HCL ER (SR) 100 MG PO TB12: ORAL_TABLET | ORAL | 4 refills | Status: DC

## 2021-05-30 MED ORDER — LORAZEPAM 0.5 MG PO TABS: 0.5000 mg | ORAL_TABLET | Freq: Every evening | ORAL | 3 refills | Status: DC | PRN

## 2021-05-30 NOTE — Telephone Encounter (Signed)
Called and spoke with patient who reports questions about billing from appointment 04/09/2021  Was given permission by patient to discuss with wife questions about appointment   -Was questioning the CCM charge from 04/09/21 - explained the CCM visit and that medications were reviewed, no changes made at that time   After discussion wife would prefer to call in for follow up appointments with CCM pharmacy if necessary, patient's follow up appointment has been cancelled   Patient/ wife has phone number for clinic to reach out if needed   Tomasa Blase, PharmD Clinical Pharmacist, [facility]

## 2021-05-30 NOTE — Telephone Encounter (Signed)
   Spouse requesting a call to discuss 04/09/21 visit  Please call [phone]

## 2021-05-30 NOTE — Progress Notes (Signed)
Patient ID: Benjamin Hahn, male   DOB: [birth year], 87 y.o.   MRN: [id number] [facility] MD/PA/NP OP Progress Note  05/30/2021 11:36 AM Benjamin Hahn  MRN:  [id number]  Chief Complaint: Major depression remission Subjective: Doing great  Today the patient is actually doing pretty well.  He is functioning well.  He continues to work.  He says his family is doing okay.  The patient says that sometimes in the afternoon of 3 or 4:00 he feels lethargic and tired.  We went over all his medications it is evident that it is not related to his medications at all.  It is noted that the way the patient takes his medicine is he takes Wellbutrin slow release 100 mg 2 in the morning.  This obviously would not affect her weight the day.  The patient takes temazepam 15 mg at night.  However he alternates it with Ativan 0.5 mg.  In essence he never takes both of these agents at the same time he takes Restoril 1 night and then the next night takes an Ativan.  On a rare basis he takes a small dose of Xanax.  This is a 0.25 mg pill and he takes on a as needed basis.  Overall he denies depression.  He is sleeping and eating well.  He has got reasonably good amount of energy.  He still works a number of hours every day.  His life is seems to be pretty good.  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  Overall he is doing well.  He is functioning reasonably well.  Past Medical History:  Diagnosis Date   Anxiety    Arthritis    might be in back, no problems   BPH (benign prostatic hyperplasia)    Depression 1990s   GERD (gastroesophageal reflux disease)    occasional   Hypercholesteremia    controled   Hypothyroid    MI (myocardial infarction) [facility]    Shingles May 2014   "Right face, still has some"   Temporal arteritis ([place])    Transient blindness of both eyes     Past Surgical History:  Procedure Laterality Date   ARTERY BIOPSY Right 06/20/2013    Procedure: BIOPSY TEMPORAL ARTERY RIGHT;  Surgeon: Earnstine Regal, MD;  [facility];  Service: General;  Laterality: Right;   CARDIAC CATHETERIZATION N/A 09/02/2015   Procedure: Left Heart Cath and Coronary Angiography;  Surgeon: Charolette Forward, MD;  Location: [place] [facility];  Service: Cardiovascular;  Laterality: N/A;   CARDIAC CATHETERIZATION N/A 09/02/2015   Procedure: Coronary Stent Intervention;  Surgeon: Charolette Forward, MD;  Location: [place] [facility];  Service: Cardiovascular;  Laterality: N/A;  1.  mid RCA      (3.0/28mm Xience) 2.  Mid LAD      (3.0/23mm Xience)   CRANIOTOMY N/A 04/19/2019   Procedure: CRANIOTOMY HEMATOMA EVACUATION SUBDURAL;  Surgeon: Jovita Gamma, MD;  Location: [place];  Service: Neurosurgery;  Laterality: N/A;  CRANIOTOMY HEMATOMA EVACUATION SUBDURAL   None      Family Psychiatric History:   Family History:  Family History  Problem Relation Age of Onset   Stroke Father        Died, 33s   Stroke Sister        Living, 64   Heart disease Mother        Died, 64   Healthy Daughter    Diabetes  Maternal Grandmother    Hypertension Neg Hx     Social History:  Social History   Socioeconomic History   Marital status: Married    Spouse name: Not on file   Number of children: Not on file   Years of education: Not on file   Highest education level: Not on file  Occupational History   Not on file  Tobacco Use   Smoking status: Former    Types: Cigarettes    Quit date: 08/24/1966    Years since quitting: 54.8   Smokeless tobacco: Never  Substance and Sexual Activity   Alcohol use: Not Currently    Comment: socially   Drug use: Never   Sexual activity: Not on file  Other Topics Concern   Not on file  Social History Narrative   ** Merged History Encounter **       Currently works as a Midwife.  Lives with wife and they have two healthy daughters.   Social Determinants of Health   Financial Resource Strain: Low Risk    Difficulty of  Paying Living Expenses: Not hard at all  Food Insecurity: No Food Insecurity   Worried About Charity fundraiser in the Last Year: Never true   [place] in the Last Year: Never true  Transportation Needs: No Transportation Needs   Lack of Transportation (Medical): No   Lack of Transportation (Non-Medical): No  Physical Activity: Inactive   Days of Exercise per Week: 0 days   Minutes of Exercise per Session: 0 min  Stress: No Stress Concern Present   Feeling of Stress : Not at all  Social Connections: Socially Integrated   Frequency of Communication with Friends and Family: More than three times a week   Frequency of Social Gatherings with Friends and Family: More than three times a week   Attends Religious Services: More than 4 times per year   Active Member of Clubs or Organizations: Yes   Attends Archivist Meetings: More than 4 times per year   Marital Status: Married    Allergies:  No Active Allergies   Metabolic Disorder Labs: Lab Results  Component Value Date   HGBA1C 5.5 04/14/2019   No results found for: PROLACTIN Lab Results  Component Value Date   CHOL 147 10/02/2020   TRIG 55.0 10/02/2020   HDL 79.30 10/02/2020   CHOLHDL 2 10/02/2020   VLDL 11.0 10/02/2020   LDLCALC 57 10/02/2020   LDLCALC 51 06/01/2019     Current Medications: Current Outpatient Medications  Medication Sig Dispense Refill   acetaminophen (TYLENOL) 325 MG tablet Take 325-650 mg by mouth every 6 (six) hours as needed for mild pain or headache.     ALPRAZolam (XANAX) 0.5 MG tablet Take 0.5 tablets (0.25 mg total) by mouth at bedtime as needed. 25 tablet 3   atorvastatin (LIPITOR) 40 MG tablet Take 1 tablet (40 mg total) by mouth daily at 6 PM. 30 tablet 3   buPROPion ER (WELLBUTRIN SR) 100 MG 12 hr tablet Take two tablets (200 mg total) by mouth each morning. 60 tablet 4   celecoxib (CELEBREX) 100 MG capsule Take 100 mg by mouth 2 (two) times daily.     hydrOXYzine  (ATARAX/VISTARIL) 25 MG tablet Take 1 tablet (25 mg total) by mouth at bedtime. (Patient taking differently: Take 25 mg by mouth at bedtime as needed.) 30 tablet 1   levothyroxine (SYNTHROID) 88 MCG tablet Take 1 tablet (88 mcg total) by  mouth daily. 90 tablet 0   LORazepam (ATIVAN) 0.5 MG tablet Take 1 tablet (0.5 mg total) by mouth at bedtime as needed. 30 tablet 3   ramipril (ALTACE) 1.25 MG capsule Take 1.25 mg by mouth daily after supper.     tamsulosin (FLOMAX) 0.4 MG CAPS capsule Take 0.8 mg by mouth daily.     temazepam (RESTORIL) 15 MG capsule Take 1 capsule (15 mg total) by mouth at bedtime as needed for sleep. 30 capsule 1   venlafaxine XR (EFFEXOR-XR) 37.5 MG 24 hr capsule Take 1 capsule (37.5 mg total) by mouth daily. 1 qam 30 capsule 6   No current facility-administered medications for this visit.    Neurologic: Headache: No Seizure: No Paresthesias: No  Musculoskeletal: Strength & Muscle Tone: within normal limits Gait & Station: normal Patient leans: NA  Psychiatric Specialty Exam: ROS  There were no vitals taken for this visit.There is no height or weight on file to calculate BMI.  General Appearance: Fairly Groomed  Eye Contact:  Good  Speech:  Clear and Coherent  Volume:  Normal  Mood:  Euthymic  Affect:  Appropriate  Thought Process:  Coherent  Orientation:  Full (Time, Place, and Person)  Thought Content:  WDL  Suicidal Thoughts:  No  Homicidal Thoughts:  No  Memory:  NA  Judgement:  Good  Insight:  Good  Psychomotor Activity:  Normal  Concentration:  Good  Recall:  Good  Fund of Knowledge: Good  Language: Good  Akathisia:  No  Handed:  Right  AIMS (if indicated):    Assets:  Desire for Improvement  ADL's:  Intact  Cognition: WNL  Sleep:      Treatment/plan   Today the patient is doing reasonably well.  We will go ahead and continue his antidepressant.  His first problem is that of major depression that he seems to do well taking Wellbutrin  100 mg slow release 2 in the morning.  His second problem is insomnia.  He alternates between taking Restoril 15 mg with Ativan 0.5 mg.  His third problem is adjustment disorder with an anxious mood state.  He rarely takes his Xanax for this.  Overall he is functioning reasonably well and return to see me in approximately 4 months. 05/30/2021, 11:36 AM [facility] MD Progress Note  05/30/2021 11:36 AM Benjamin Hahn  MRN:  [id number] Subjective:  Feels well Principal Problem: Major depression, chronic, mild/residual Diagnosis:  Major depression, recurrent residual Today the patient is doing fairly well. He's been having some medical problems. He recently went for Thanksgiving to a length but unfortunately had a urinary tract infection. He also since then has been coughing and recently started on antibiotic. Emotionally he is fairly stable. He denies daily depression. He stays very active at work. He's been unable to exercise. The patient is sleeping well and has an increase in his appetite. His energy is good. Is no problems thinking or concentrating. He is recently having a productive cough but is no shortness of breath. He denies any chest pain. Is a stable relationship with his wife. His kidneys are good. He has 4 grandchildren. Everybody in his home is good. He only issue is that a number mornings he awakens and he feels oversedated. It occurs point of his wife feels uncomfortable with him driving will taken to work. Today reviewed his medications and is evident that he likely needs reduction. He also notes that his memory has change in his and is good. 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 But for now been discontinued. Patient Active Problem List   Diagnosis Date Noted   Hyperglycemia [R73.9] 10/02/2020   Bradycardia [R00.1] 10/02/2020   Routine general medical examination at a health care facility [Z00.00] 12/09/2019   Psychophysiological insomnia [F51.04] 12/07/2019    Deficiency anemia [D53.9] 06/01/2019   Vitamin D deficiency [E55.9] 06/01/2019   Chronic idiopathic constipation [K59.04] 06/01/2019   Mild intermittent asthma with acute exacerbation [J45.21] 02/08/2018   Hypothyroidism [E03.9] 03/14/2013   Hyperlipidemia LDL goal <70 [E78.5] 03/14/2013   Benign prostatic hyperplasia [N40.0] 03/14/2013   Hypogonadism, male [E29.1] 03/14/2013   Erectile dysfunction [N52.9] 03/14/2013   Total Time spent with patient: 30 minutes  Past Psychiatric History:   Past Medical History:  Past Medical History:  Diagnosis Date   Anxiety    Arthritis    might be in back, no problems   BPH (benign prostatic hyperplasia)    Depression 1990s   GERD (gastroesophageal reflux disease)    occasional   Hypercholesteremia    controled   Hypothyroid    MI (myocardial infarction) [facility])    Shingles May 2014   "Right face, still has some"   Temporal arteritis ([place])    Transient blindness of both eyes     Past Surgical History:  Procedure Laterality Date   ARTERY BIOPSY Right 06/20/2013   Procedure: BIOPSY TEMPORAL ARTERY RIGHT;  Surgeon: Earnstine Regal, MD;  [facility];  Service: General;  Laterality: Right;   CARDIAC CATHETERIZATION N/A 09/02/2015   Procedure: Left Heart Cath and Coronary Angiography;  Surgeon: Charolette Forward, MD;  Location: [place] [facility];  Service: Cardiovascular;  Laterality: N/A;   CARDIAC CATHETERIZATION N/A 09/02/2015   Procedure: Coronary Stent Intervention;  Surgeon: Charolette Forward, MD;  Location: [place] [facility];  Service: Cardiovascular;  Laterality: N/A;  1.  mid RCA      (3.0/28mm Xience) 2.  Mid LAD      (3.0/23mm Xience)   CRANIOTOMY N/A 04/19/2019   Procedure: CRANIOTOMY HEMATOMA EVACUATION SUBDURAL;  Surgeon: Jovita Gamma, MD;  Location: [place];  Service: Neurosurgery;  Laterality: N/A;  CRANIOTOMY HEMATOMA EVACUATION SUBDURAL   None     Family History:  Family History  Problem Relation Age of Onset   Stroke Father         Died, 30s   Stroke Sister        Living, 33   Heart disease Mother        Died, 75   Healthy Daughter    Diabetes Maternal Grandmother    Hypertension Neg Hx    Family Psychiatric  History:  Social History:  Social History   Substance and Sexual Activity  Alcohol Use Not Currently   Comment: socially     Social History   Substance and Sexual Activity  Drug Use Never    Social History   Socioeconomic History   Marital status: Married    Spouse name: Not on file   Number of children: Not on file   Years of education: Not on file   Highest education level: Not on file  Occupational History   Not on file  Tobacco Use   Smoking status: Former    Types: Cigarettes    Quit date: 08/24/1966    Years since quitting: 54.8   Smokeless tobacco: Never  Substance and Sexual Activity   Alcohol use: Not Currently    Comment: socially   Drug  use: Never   Sexual activity: Not on file  Other Topics Concern   Not on file  Social History Narrative   ** Merged History Encounter **       Currently works as a Midwife.  Lives with wife and they have two healthy daughters.   Social Determinants of Health   Financial Resource Strain: Low Risk    Difficulty of Paying Living Expenses: Not hard at all  Food Insecurity: No Food Insecurity   Worried About Charity fundraiser in the Last Year: Never true   [place] in the Last Year: Never true  Transportation Needs: No Transportation Needs   Lack of Transportation (Medical): No   Lack of Transportation (Non-Medical): No  Physical Activity: Inactive   Days of Exercise per Week: 0 days   Minutes of Exercise per Session: 0 min  Stress: No Stress Concern Present   Feeling of Stress : Not at all  Social Connections: Socially Integrated   Frequency of Communication with Friends and Family: More than three times a week   Frequency of Social Gatherings with Friends and Family: More than three times a week   Attends  Religious Services: More than 4 times per year   Active Member of [employer] or Organizations: Yes   Attends Music therapist: More than 4 times per year   Marital Status: Married   Additional Social History:                         Sleep: Good  Appetite:  Good  Current Medications: Current Outpatient Medications  Medication Sig Dispense Refill   acetaminophen (TYLENOL) 325 MG tablet Take 325-650 mg by mouth every 6 (six) hours as needed for mild pain or headache.     ALPRAZolam (XANAX) 0.5 MG tablet Take 0.5 tablets (0.25 mg total) by mouth at bedtime as needed. 25 tablet 3   atorvastatin (LIPITOR) 40 MG tablet Take 1 tablet (40 mg total) by mouth daily at 6 PM. 30 tablet 3   buPROPion ER (WELLBUTRIN SR) 100 MG 12 hr tablet Take two tablets (200 mg total) by mouth each morning. 60 tablet 4   celecoxib (CELEBREX) 100 MG capsule Take 100 mg by mouth 2 (two) times daily.     hydrOXYzine (ATARAX/VISTARIL) 25 MG tablet Take 1 tablet (25 mg total) by mouth at bedtime. (Patient taking differently: Take 25 mg by mouth at bedtime as needed.) 30 tablet 1   levothyroxine (SYNTHROID) 88 MCG tablet Take 1 tablet (88 mcg total) by mouth daily. 90 tablet 0   LORazepam (ATIVAN) 0.5 MG tablet Take 1 tablet (0.5 mg total) by mouth at bedtime as needed. 30 tablet 3   ramipril (ALTACE) 1.25 MG capsule Take 1.25 mg by mouth daily after supper.     tamsulosin (FLOMAX) 0.4 MG CAPS capsule Take 0.8 mg by mouth daily.     temazepam (RESTORIL) 15 MG capsule Take 1 capsule (15 mg total) by mouth at bedtime as needed for sleep. 30 capsule 1   venlafaxine XR (EFFEXOR-XR) 37.5 MG 24 hr capsule Take 1 capsule (37.5 mg total) by mouth daily. 1 qam 30 capsule 6   No current facility-administered medications for this visit.    Lab Results:  No results found for this or any previous visit (from the past 48 hour(s)).  Physical Findings: AIMS:  , ,  ,  ,    CIWA:    COWS:  Musculoskeletal: Strength & Muscle Tone: within normal limits Gait & Station: normal Patient leans: Right  Psychiatric Specialty Exam: ROS  There were no vitals taken for this visit.There is no height or weight on file to calculate BMI.  General Appearance: Negative  Eye Contact::  Good  Speech:  Clear and Coherent  Volume:  Normal  Mood:  NA  Affect:  Appropriate  Thought Process:  Coherent  Orientation:  Full (Time, Place, and Person)  Thought Content:  WDL  Suicidal Thoughts:  No  Homicidal Thoughts:  No  Memory:  NA  Judgement:  Good  Insight:  Good  Psychomotor Activity:  Normal  Concentration:  Good  Recall:  Good  Fund of Knowledge:Good  Language: Good  Akathisia:  No  Handed:  Right  AIMS (if indicated):     Assets:  Desire for Improvement  ADL's:  Intact  Cognition: WNL  Sleep:      [facility] MD Progress Note  05/30/2021 11:36 AM BOWDY BAIR  MRN:  [id number] Subjective:  Feels well Principal Problem: Major depression, chronic, mild/residual Diagnosis:  Major depression, recurrent residual Today the patient is doing fairly well. He's been having some medical problems. He recently went for Thanksgiving to a length but unfortunately had a urinary tract infection. He also since then has been coughing and recently started on antibiotic. Emotionally he is fairly stable. He denies daily depression. He stays very active at work. He's been unable to exercise. The patient is sleeping well and has an increase in his appetite. His energy is good. Is no problems thinking or concentrating. He is recently having a productive cough but is no shortness of breath. He denies any chest pain. Is a stable relationship with his wife. His kidneys are good. He has 4 grandchildren. Everybody in his home is good. He only issue is that a number mornings he awakens and he feels oversedated. It occurs point of his wife feels uncomfortable with him driving will taken to  work. Today reviewed his medications and is evident that he likely needs reduction. He also notes that his memory has change in his and is good. 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 Patient Active Problem List   Diagnosis Date Noted   Hyperglycemia [R73.9] 10/02/2020   Bradycardia [R00.1] 10/02/2020   Routine general medical examination at a health care facility [Z00.00] 12/09/2019   Psychophysiological insomnia [F51.04] 12/07/2019   Deficiency anemia [D53.9] 06/01/2019   Vitamin D deficiency [E55.9] 06/01/2019   Chronic idiopathic constipation [K59.04] 06/01/2019   Mild intermittent asthma with acute exacerbation [J45.21] 02/08/2018   Hypothyroidism [E03.9] 03/14/2013   Hyperlipidemia LDL goal <70 [E78.5] 03/14/2013   Benign prostatic hyperplasia [N40.0] 03/14/2013   Hypogonadism, male [E29.1] 03/14/2013   Erectile dysfunction [N52.9] 03/14/2013   Total Time spent with patient: 30 minutes  Past Psychiatric History:   Past Medical History:  Past Medical History:  Diagnosis Date   Anxiety    Arthritis    might be in back, no problems   BPH (benign prostatic hyperplasia)    Depression 1990s   GERD (gastroesophageal reflux disease)    occasional   Hypercholesteremia    controled   Hypothyroid    MI (myocardial infarction) [facility])    Shingles May 2014   "Right face, still has some"   Temporal arteritis ([facility])    Transient blindness of both eyes     Past Surgical History:  Procedure Laterality Date   ARTERY BIOPSY Right 06/20/2013   Procedure: BIOPSY TEMPORAL ARTERY RIGHT;  Surgeon: Earnstine Regal, MD;  [facility];  Service: General;  Laterality: Right;   CARDIAC CATHETERIZATION N/A 09/02/2015   Procedure: Left Heart Cath and Coronary Angiography;  Surgeon: Charolette Forward, MD;  Location: [place] [facility];  Service: Cardiovascular;  Laterality: N/A;   CARDIAC CATHETERIZATION N/A 09/02/2015   Procedure: Coronary Stent  Intervention;  Surgeon: Charolette Forward, MD;  Location: [place] [facility];  Service: Cardiovascular;  Laterality: N/A;  1.  mid RCA      (3.0/28mm Xience) 2.  Mid LAD      (3.0/23mm Xience)   CRANIOTOMY N/A 04/19/2019   Procedure: CRANIOTOMY HEMATOMA EVACUATION SUBDURAL;  Surgeon: Jovita Gamma, MD;  Location: [place];  Service: Neurosurgery;  Laterality: N/A;  CRANIOTOMY HEMATOMA EVACUATION SUBDURAL   None     Family History:  Family History  Problem Relation Age of Onset   Stroke Father        Died, 6s   Stroke Sister        Living, 38   Heart disease Mother        Died, 1   Healthy Daughter    Diabetes Maternal Grandmother    Hypertension Neg Hx    Family Psychiatric  History:  Social History:  Social History   Substance and Sexual Activity  Alcohol Use Not Currently   Comment: socially     Social History   Substance and Sexual Activity  Drug Use Never    Social History   Socioeconomic History   Marital status: Married    Spouse name: Not on file   Number of children: Not on file   Years of education: Not on file   Highest education level: Not on file  Occupational History   Not on file  Tobacco Use   Smoking status: Former    Types: Cigarettes    Quit date: 08/24/1966    Years since quitting: 54.8   Smokeless tobacco: Never  Substance and Sexual Activity   Alcohol use: Not Currently    Comment: socially   Drug use: Never   Sexual activity: Not on file  Other Topics Concern   Not on file  Social History Narrative   ** Merged History Encounter **       Currently works as a Midwife.  Lives with wife and they have two healthy daughters.   Social Determinants of Health   Financial Resource Strain: Low Risk    Difficulty of Paying Living Expenses: Not hard at all  Food Insecurity: No Food Insecurity   Worried About Charity fundraiser in the Last Year: Never true   [place] in the Last Year: Never true  Transportation Needs: No  Transportation Needs   Lack of Transportation (Medical): No   Lack of Transportation (Non-Medical): No  Physical Activity: Inactive   Days of Exercise per Week: 0 days   Minutes of Exercise per Session: 0 min  Stress: No Stress Concern Present   Feeling of Stress : Not at all  Social Connections: Socially Integrated   Frequency of Communication with Friends and Family: More than three times a week   Frequency of Social Gatherings with Friends and Family: More than three times a week   Attends Religious Services: More than 4 times per year   Active Member of [employer] or Organizations: Yes   Attends Archivist Meetings:  More than 4 times per year   Marital Status: Married   Additional Social History:                         Sleep: Good  Appetite:  Good  Current Medications: Current Outpatient Medications  Medication Sig Dispense Refill   acetaminophen (TYLENOL) 325 MG tablet Take 325-650 mg by mouth every 6 (six) hours as needed for mild pain or headache.     ALPRAZolam (XANAX) 0.5 MG tablet Take 0.5 tablets (0.25 mg total) by mouth at bedtime as needed. 25 tablet 3   atorvastatin (LIPITOR) 40 MG tablet Take 1 tablet (40 mg total) by mouth daily at 6 PM. 30 tablet 3   buPROPion ER (WELLBUTRIN SR) 100 MG 12 hr tablet Take two tablets (200 mg total) by mouth each morning. 60 tablet 4   celecoxib (CELEBREX) 100 MG capsule Take 100 mg by mouth 2 (two) times daily.     hydrOXYzine (ATARAX/VISTARIL) 25 MG tablet Take 1 tablet (25 mg total) by mouth at bedtime. (Patient taking differently: Take 25 mg by mouth at bedtime as needed.) 30 tablet 1   levothyroxine (SYNTHROID) 88 MCG tablet Take 1 tablet (88 mcg total) by mouth daily. 90 tablet 0   LORazepam (ATIVAN) 0.5 MG tablet Take 1 tablet (0.5 mg total) by mouth at bedtime as needed. 30 tablet 3   ramipril (ALTACE) 1.25 MG capsule Take 1.25 mg by mouth daily after supper.     tamsulosin (FLOMAX) 0.4 MG CAPS capsule Take 0.8  mg by mouth daily.     temazepam (RESTORIL) 15 MG capsule Take 1 capsule (15 mg total) by mouth at bedtime as needed for sleep. 30 capsule 1   venlafaxine XR (EFFEXOR-XR) 37.5 MG 24 hr capsule Take 1 capsule (37.5 mg total) by mouth daily. 1 qam 30 capsule 6   No current facility-administered medications for this visit.    Lab Results:  No results found for this or any previous visit (from the past 48 hour(s)).  Physical Findings: AIMS:  , ,  ,  ,    CIWA:    COWS:     Musculoskeletal: Strength & Muscle Tone: within normal limits Gait & Station: normal Patient leans: Right  Psychiatric Specialty Exam: ROS  There were no vitals taken for this visit.There is no height or weight on file to calculate BMI.  General Appearance: Negative  Eye Contact::  Good  Speech:  Clear and Coherent  Volume:  Normal  Mood:  NA  Affect:  Appropriate  Thought Process:  Coherent  Orientation:  Full (Time, Place, and Person)  Thought Content:  WDL  Suicidal Thoughts:  No  Homicidal Thoughts:  No  Memory:  NA  Judgement:  Good  Insight:  Good  Psychomotor Activity:  Normal  Concentration:  Good  Recall:  Good  Fund of Knowledge:Good  Language: Good  Akathisia:  No  Handed:  Right  AIMS (if indicated):     Assets:  Desire for Improvement  ADL's:  Intact  Cognition: WNL  Sleep:      Treatment Plan Summary: This patient's first problem is that of major depression.  He will continue taking Wellbutrin 150 mg and Effexor as prescribed.  His second problem is that of insomnia.  The patient is doing very well taking Restoril and will continue taking that.  He has episodic anxiety.  For that he takes Xanax 0.5 mg a half as needed.  I shared with him that he can also take Vistaril instead of the Xanax.  In reality I do not mind if he takes some Vistaril and Xanax both of them on a as needed basis.  Will stay on Restoril for sleep.  To be seen again in a few months.  The patient actually is doing  better than usual.

## 2021-06-05 ENCOUNTER — Telehealth (HOSPITAL_COMMUNITY): Payer: Self-pay | Admitting: *Deleted

## 2021-06-05 NOTE — Telephone Encounter (Signed)
Pt called stating that the Restoril 15 mg HS is "making me increasingly depressed"  and causing "change in personality". Please review and advise.

## 2021-06-10 ENCOUNTER — Other Ambulatory Visit (HOSPITAL_COMMUNITY): Payer: Self-pay

## 2021-06-10 MED ORDER — TEMAZEPAM 15 MG PO CAPS: 15.0000 mg | ORAL_CAPSULE | Freq: Every evening | ORAL | 0 refills | Status: DC | PRN

## 2021-06-25 ENCOUNTER — Telehealth: Payer: Self-pay

## 2021-06-25 NOTE — Chronic Care Management (AMB) (Signed)
    Chronic Care Management Pharmacy Assistant   Name: Benjamin Hahn  MRN: [id number] DOB: 07-Oct-1932   Reason for Encounter: Disease State   Conditions to be addressed/monitored: General Adherence   Recent office visits:  None ID  Recent consult visits:  None ID  Hospital visits:  None in previous 6 months  Medications: Outpatient Encounter Medications as of 06/25/2021  Medication Sig   acetaminophen (TYLENOL) 325 MG tablet Take 325-650 mg by mouth every 6 (six) hours as needed for mild pain or headache.   ALPRAZolam (XANAX) 0.5 MG tablet Take 0.5 tablets (0.25 mg total) by mouth at bedtime as needed.   atorvastatin (LIPITOR) 40 MG tablet Take 1 tablet (40 mg total) by mouth daily at 6 PM.   buPROPion ER (WELLBUTRIN SR) 100 MG 12 hr tablet Take two tablets (200 mg total) by mouth each morning.   celecoxib (CELEBREX) 100 MG capsule Take 100 mg by mouth 2 (two) times daily.   hydrOXYzine (ATARAX/VISTARIL) 25 MG tablet Take 1 tablet (25 mg total) by mouth at bedtime. (Patient taking differently: Take 25 mg by mouth at bedtime as needed.)   levothyroxine (SYNTHROID) 88 MCG tablet Take 1 tablet (88 mcg total) by mouth daily.   LORazepam (ATIVAN) 0.5 MG tablet Take 1 tablet (0.5 mg total) by mouth at bedtime as needed.   ramipril (ALTACE) 1.25 MG capsule Take 1.25 mg by mouth daily after supper.   tamsulosin (FLOMAX) 0.4 MG CAPS capsule Take 0.8 mg by mouth daily.   temazepam (RESTORIL) 15 MG capsule Take 1 capsule (15 mg total) by mouth at bedtime as needed for sleep.   venlafaxine XR (EFFEXOR-XR) 37.5 MG 24 hr capsule Take 1 capsule (37.5 mg total) by mouth daily. 1 qam   No facility-administered encounter medications on file as of 06/25/2021.   Pharmacist Review Have you had any problems recently with your health? Patient states that he does not have any new health issues  Have you had any problems with your pharmacy?Patient is not having any problems with getting  medications or the cost of medications from the pharmacy  What issues or side effects are you having with your medications? Patient is not having any side effects from medications  What would you like me to pass along to [facility] for them to help you with? Patient states that he is doing just fine and he did not want to make an appointment   What can we do to take care of you better? Patient states not at this time  Care Gaps: Colonoscopy-NA Diabetic Foot Exam-NA Ophthalmology-NA Dexa Scan - NA Annual Well Visit -NA  Micro albumin-NA Hemoglobin A1c-NA  Star Rating Drugs: Atorvastatin 40 mg-last fill 06/04/21 30 ds Ramipril 1.25 mg-last fill 06/04/21 30 ds  Ethelene Hal Clinical Pharmacist Assistant [phone]

## 2021-06-27 DIAGNOSIS — Z23 Encounter for immunization: Secondary | ICD-10-CM | POA: Diagnosis not present

## 2021-07-10 ENCOUNTER — Other Ambulatory Visit (HOSPITAL_COMMUNITY): Payer: Self-pay | Admitting: *Deleted

## 2021-07-10 MED ORDER — TEMAZEPAM 15 MG PO CAPS
15.0000 mg | ORAL_CAPSULE | Freq: Every evening | ORAL | 0 refills | Status: DC | PRN
Start: 2021-07-10 — End: 2021-07-29

## 2021-07-11 ENCOUNTER — Other Ambulatory Visit: Payer: Self-pay | Admitting: Internal Medicine

## 2021-07-11 DIAGNOSIS — E039 Hypothyroidism, unspecified: Secondary | ICD-10-CM

## 2021-07-14 ENCOUNTER — Other Ambulatory Visit: Payer: Self-pay | Admitting: Internal Medicine

## 2021-07-14 ENCOUNTER — Telehealth: Payer: Self-pay | Admitting: Internal Medicine

## 2021-07-14 DIAGNOSIS — E039 Hypothyroidism, unspecified: Secondary | ICD-10-CM

## 2021-07-14 NOTE — Telephone Encounter (Signed)
1.Medication Requested: levothyroxine (SYNTHROID) 88 MCG tablet  2. Pharmacy (Name, [place], [facility]): [place], [place] [address] Phone:  [phone]  Fax:  [phone]     3. On Med List: y   52. Last Visit with PCP:  5. Next visit date with PCP: 12.27.2022  Pt. Requesting short- supply of medication, until next available appt. With PCP.

## 2021-07-15 NOTE — Telephone Encounter (Signed)
MD sent 30 day script to pof until appt.Marland KitchenJohny Chess

## 2021-07-28 ENCOUNTER — Telehealth (HOSPITAL_COMMUNITY): Payer: Self-pay | Admitting: *Deleted

## 2021-07-28 NOTE — Telephone Encounter (Signed)
Pt called requesting refill of Restoril 15 mg. Pt states he gets a full 10 hours sleep with this medication. Pt requesting more than #14 as it was last prescribed. Please review. Pt next appointment is on 08/27/21.

## 2021-07-29 ENCOUNTER — Other Ambulatory Visit (HOSPITAL_COMMUNITY): Payer: Self-pay | Admitting: Psychiatry

## 2021-07-29 MED ORDER — TEMAZEPAM 15 MG PO CAPS: 15.0000 mg | ORAL_CAPSULE | Freq: Every evening | ORAL | 3 refills | Status: DC | PRN

## 2021-08-07 ENCOUNTER — Other Ambulatory Visit: Payer: Self-pay | Admitting: Internal Medicine

## 2021-08-07 DIAGNOSIS — E039 Hypothyroidism, unspecified: Secondary | ICD-10-CM

## 2021-08-14 ENCOUNTER — Other Ambulatory Visit: Payer: Self-pay | Admitting: Internal Medicine

## 2021-08-14 DIAGNOSIS — E039 Hypothyroidism, unspecified: Secondary | ICD-10-CM

## 2021-08-14 MED ORDER — LEVOTHYROXINE SODIUM 88 MCG PO TABS: 88.0000 ug | ORAL_TABLET | Freq: Every day | ORAL | 0 refills | Status: DC

## 2021-08-14 NOTE — Telephone Encounter (Signed)
Pt informed that 1w supply has been sent to pharmacy

## 2021-08-14 NOTE — Telephone Encounter (Signed)
Calling in to check status of refill request  Patient has med refill appt 12/27

## 2021-08-19 ENCOUNTER — Encounter: Payer: Self-pay | Admitting: Internal Medicine

## 2021-08-19 ENCOUNTER — Ambulatory Visit (INDEPENDENT_AMBULATORY_CARE_PROVIDER_SITE_OTHER): Payer: Medicare Other | Admitting: Internal Medicine

## 2021-08-19 ENCOUNTER — Other Ambulatory Visit: Payer: Self-pay

## 2021-08-19 VITALS — BP 128/70 | HR 79 | Temp 97.7°F | Ht 67.5 in | Wt 183.0 lb

## 2021-08-19 DIAGNOSIS — K5904 Chronic idiopathic constipation: Secondary | ICD-10-CM

## 2021-08-19 DIAGNOSIS — N1832 Chronic kidney disease, stage 3b: Secondary | ICD-10-CM | POA: Diagnosis not present

## 2021-08-19 DIAGNOSIS — D539 Nutritional anemia, unspecified: Secondary | ICD-10-CM

## 2021-08-19 DIAGNOSIS — E039 Hypothyroidism, unspecified: Secondary | ICD-10-CM | POA: Diagnosis not present

## 2021-08-19 LAB — CBC WITH DIFFERENTIAL/PLATELET
Basophils Absolute: 0.1 10*3/uL (ref 0.0–0.1)
Basophils Relative: 0.9 % (ref 0.0–3.0)
Eosinophils Absolute: 0.1 10*3/uL (ref 0.0–0.7)
Eosinophils Relative: 2 % (ref 0.0–5.0)
HCT: 31.5 % — ABNORMAL LOW (ref 39.0–52.0)
Hemoglobin: 10.5 g/dL — ABNORMAL LOW (ref 13.0–17.0)
Lymphocytes Relative: 25.5 % (ref 12.0–46.0)
Lymphs Abs: 1.5 10*3/uL (ref 0.7–4.0)
MCHC: 33.5 g/dL (ref 30.0–36.0)
MCV: 91.3 fl (ref 78.0–100.0)
Monocytes Absolute: 0.5 10*3/uL (ref 0.1–1.0)
Monocytes Relative: 9.1 % (ref 3.0–12.0)
Neutro Abs: 3.6 10*3/uL (ref 1.4–7.7)
Neutrophils Relative %: 62.5 % (ref 43.0–77.0)
Platelets: 190 10*3/uL (ref 150.0–400.0)
RBC: 3.45 Mil/uL — ABNORMAL LOW (ref 4.22–5.81)
RDW: 13.3 % (ref 11.5–15.5)
WBC: 5.7 10*3/uL (ref 4.0–10.5)

## 2021-08-19 LAB — TSH: TSH: 4.84 u[IU]/mL (ref 0.35–5.50)

## 2021-08-19 LAB — BASIC METABOLIC PANEL
BUN: 24 mg/dL — ABNORMAL HIGH (ref 6–23)
CO2: 28 mEq/L (ref 19–32)
Calcium: 9 mg/dL (ref 8.4–10.5)
Chloride: 103 mEq/L (ref 96–112)
Creatinine, Ser: 1.34 mg/dL (ref 0.40–1.50)
GFR: 47.95 mL/min — ABNORMAL LOW (ref >60.00)
Glucose, Bld: 94 mg/dL (ref 70–99)
Potassium: 4.6 mEq/L (ref 3.5–5.1)
Sodium: 138 mEq/L (ref 135–145)

## 2021-08-19 LAB — MAGNESIUM: Magnesium: 2.1 mg/dL (ref 1.5–2.5)

## 2021-08-19 MED ORDER — LUBIPROSTONE 24 MCG PO CAPS: 24.0000 ug | ORAL_CAPSULE | Freq: Two times a day (BID) | ORAL | 1 refills | Status: DC

## 2021-08-19 MED ORDER — LEVOTHYROXINE SODIUM 88 MCG PO TABS: 88.0000 ug | ORAL_TABLET | Freq: Every day | ORAL | 1 refills | Status: DC

## 2021-08-19 NOTE — Progress Notes (Signed)
Subjective:  Patient ID: Benjamin Hahn, male    DOB: 1933/04/07  Age: 87 y.o. MRN: [id number]  CC: Anemia and Hypothyroidism  This visit occurred during the SARS-CoV-2 public health emergency.  Safety protocols were in place, including screening questions prior to the visit, additional usage of staff PPE, and extensive cleaning of exam room while observing appropriate contact time as indicated for disinfecting solutions.    HPI Benjamin Hahn presents for f/up -   He complains of intermittent constipation and straining.  He strained so hard about 2 weeks ago that he passed some blood.  It was a brief episode and has had no more bright red blood per rectum since then.  He denies abdominal pain, nausea, vomiting, or melena.  Outpatient Medications Prior to Visit  Medication Sig Dispense Refill   acetaminophen (TYLENOL) 325 MG tablet Take 325-650 mg by mouth every 6 (six) hours as needed for mild pain or headache.     ALPRAZolam (XANAX) 0.5 MG tablet Take 0.5 tablets (0.25 mg total) by mouth at bedtime as needed. 25 tablet 3   buPROPion ER (WELLBUTRIN SR) 100 MG 12 hr tablet Take two tablets (200 mg total) by mouth each morning. 60 tablet 4   celecoxib (CELEBREX) 100 MG capsule Take 100 mg by mouth 2 (two) times daily.     hydrOXYzine (ATARAX/VISTARIL) 25 MG tablet Take 1 tablet (25 mg total) by mouth at bedtime. (Patient taking differently: Take 25 mg by mouth at bedtime as needed.) 30 tablet 1   LORazepam (ATIVAN) 0.5 MG tablet Take 1 tablet (0.5 mg total) by mouth at bedtime as needed. 30 tablet 3   ramipril (ALTACE) 1.25 MG capsule Take 1.25 mg by mouth daily after supper.     tamsulosin (FLOMAX) 0.4 MG CAPS capsule Take 0.8 mg by mouth daily.     temazepam (RESTORIL) 15 MG capsule Take 1 capsule (15 mg total) by mouth at bedtime as needed for sleep. 30 capsule 3   venlafaxine XR (EFFEXOR-XR) 37.5 MG 24 hr capsule Take 1 capsule (37.5 mg total) by mouth daily. 1 qam 30 capsule 6    atorvastatin (LIPITOR) 40 MG tablet Take 1 tablet (40 mg total) by mouth daily at 6 PM. 30 tablet 3   levothyroxine (SYNTHROID) 88 MCG tablet Take 1 tablet (88 mcg total) by mouth daily. 7 tablet 0   No facility-administered medications prior to visit.    ROS Review of Systems  Constitutional:  Negative for diaphoresis, fatigue and unexpected weight change.  HENT: Negative.    Eyes: Negative.   Respiratory:  Negative for cough, chest tightness, shortness of breath and wheezing.   Cardiovascular:  Negative for chest pain, palpitations and leg swelling.  Gastrointestinal:  Positive for constipation. Negative for abdominal pain, anal bleeding, blood in stool, nausea and vomiting.  Endocrine: Negative for cold intolerance and heat intolerance.  Genitourinary: Negative.  Negative for difficulty urinating.  Musculoskeletal: Negative.  Negative for arthralgias and myalgias.  Skin: Negative.  Negative for color change.  Neurological: Negative.  Negative for dizziness, weakness and light-headedness.  Hematological:  Negative for adenopathy. Does not bruise/bleed easily.  Psychiatric/Behavioral: Negative.     Objective:  BP 128/70 (BP Location: Right Arm, Patient Position: Sitting, Cuff Size: Large)    Pulse 79    Temp 97.7 F (36.5 C) (Oral)    Ht 5' 7.5" (1.715 m)    Wt 183 lb (83 kg)    SpO2 92%    BMI 28.24  kg/m   BP Readings from Last 3 Encounters:  08/19/21 128/70  01/23/21 102/60  10/07/20 122/64    Wt Readings from Last 3 Encounters:  08/19/21 183 lb (83 kg)  01/23/21 183 lb 3.2 oz (83.1 kg)  10/02/20 186 lb 3.2 oz (84.5 kg)    Physical Exam Vitals reviewed.  HENT:     Nose: Nose normal.     Mouth/Throat:     Mouth: Mucous membranes are moist.  Eyes:     General: No scleral icterus.    Conjunctiva/sclera: Conjunctivae normal.  Cardiovascular:     Rate and Rhythm: Normal rate and regular rhythm.     Heart sounds: No murmur heard. Pulmonary:     Effort: Pulmonary  effort is normal.     Breath sounds: No stridor. No wheezing, rhonchi or rales.  Abdominal:     General: Abdomen is flat.     Palpations: There is no mass.     Tenderness: There is no abdominal tenderness. There is no guarding.     Hernia: No hernia is present.  Musculoskeletal:        General: Normal range of motion.     Cervical back: Neck supple.     Right lower leg: No edema.     Left lower leg: No edema.  Lymphadenopathy:     Cervical: No cervical adenopathy.  Skin:    General: Skin is warm and dry.  Neurological:     General: No focal deficit present.     Mental Status: He is alert.  Psychiatric:        Mood and Affect: Mood normal.        Behavior: Behavior normal.    Lab Results  Component Value Date   WBC 5.7 08/19/2021   HGB 10.5 (L) 08/19/2021   HCT 31.5 (L) 08/19/2021   PLT 190.0 08/19/2021   GLUCOSE 94 08/19/2021   CHOL 147 10/02/2020   TRIG 55.0 10/02/2020   HDL 79.30 10/02/2020   LDLCALC 57 10/02/2020   ALT 23 10/02/2020   AST 20 10/02/2020   NA 138 08/19/2021   K 4.6 08/19/2021   CL 103 08/19/2021   CREATININE 1.34 08/19/2021   BUN 24 (H) 08/19/2021   CO2 28 08/19/2021   TSH 4.84 08/19/2021   INR 1.1 10/11/2019   HGBA1C 5.5 04/14/2019    CT Head Wo Contrast  Result Date: 10/11/2019 CLINICAL DATA:  87 year old who fell off of a curb while at a shopping center earlier today with loss of consciousness and laceration to the LEFT eyebrow. Patient is amnestic to the event. Initial encounter. Personal history of subdural hematoma. EXAM: CT HEAD WITHOUT CONTRAST CT CERVICAL SPINE WITHOUT CONTRAST TECHNIQUE: Multidetector CT imaging of the head and cervical spine was performed following the standard protocol without intravenous contrast. Multiplanar CT image reconstructions of the cervical spine were also generated. COMPARISON:  05/31/2019 and earlier. FINDINGS: CT HEAD FINDINGS Brain: Moderate cortical, deep and cerebellar atrophy as noted previously. No mass  lesion. No midline shift. No acute hemorrhage or hematoma. No extra-axial fluid collections. No evidence of acute infarction. Dural thickening beneath the RIGHT frontal craniotomy flap. Vascular: Moderate to severe BILATERAL carotid siphon and mild BILATERAL vertebral artery atherosclerosis. No hyperdense vessel. Skull: Prior RIGHT frontal craniotomy. No skull fracture or other focal osseous abnormality involving the skull. Sinuses/Orbits: Preseptal soft tissue swelling/hematoma anterior to the LEFT orbit. No evidence of intraorbital hemorrhage. Other: None. CT CERVICAL SPINE FINDINGS Alignment: Anatomic posterior alignment. Straightening of the  usual cervical lordosis. Facet joints anatomically aligned throughout with severe diffuse degenerative changes. Skull base and vertebrae: No fractures identified involving the cervical spine. Coronal reformatted images demonstrate an intact craniocervical junction, intact dens and intact lateral masses throughout. Degenerative changes at the C1-C2 articulation with calcified pannus POSTERIOR to the dens. Soft tissues and spinal canal: No evidence of paraspinous or spinal canal hematoma. No evidence of spinal stenosis. Disc levels: Severe disc space narrowing and associated endplate hypertrophic changes at C6-7. Moderate disc space narrowing at C5-6 and C7-T1. Calcification within the C2-3 disc. Combination of facet and uncinate hypertrophy account for multilevel foraminal stenoses including severe BILATERAL C3-4, moderate BILATERAL C4-5, severe BILATERAL C5-6, severe BILATERAL C6-7. Upper chest: Visualized lung apices clear. Mild atherosclerosis involving the visualized proximal great vessels. Other: DISH involving the visualized UPPER thoracic spine. BILATERAL cervical carotid atherosclerosis. IMPRESSION: 1. No acute intracranial abnormality. 2. Moderate generalized atrophy. 3. No cervical spine fractures identified. 4. Multilevel degenerative disc disease, spondylosis and  foraminal stenoses throughout the cervical spine as detailed above. 5. Preseptal soft tissue swelling/hematoma anterior to the LEFT orbit without evidence of intraorbital hemorrhage. Electronically Signed   By: Evangeline Dakin M.D.   On: 10/11/2019 18:10   CT Cervical Spine Wo Contrast  Result Date: 10/11/2019 CLINICAL DATA:  87 year old who fell off of a curb while at a shopping center earlier today with loss of consciousness and laceration to the LEFT eyebrow. Patient is amnestic to the event. Initial encounter. Personal history of subdural hematoma. EXAM: CT HEAD WITHOUT CONTRAST CT CERVICAL SPINE WITHOUT CONTRAST TECHNIQUE: Multidetector CT imaging of the head and cervical spine was performed following the standard protocol without intravenous contrast. Multiplanar CT image reconstructions of the cervical spine were also generated. COMPARISON:  05/31/2019 and earlier. FINDINGS: CT HEAD FINDINGS Brain: Moderate cortical, deep and cerebellar atrophy as noted previously. No mass lesion. No midline shift. No acute hemorrhage or hematoma. No extra-axial fluid collections. No evidence of acute infarction. Dural thickening beneath the RIGHT frontal craniotomy flap. Vascular: Moderate to severe BILATERAL carotid siphon and mild BILATERAL vertebral artery atherosclerosis. No hyperdense vessel. Skull: Prior RIGHT frontal craniotomy. No skull fracture or other focal osseous abnormality involving the skull. Sinuses/Orbits: Preseptal soft tissue swelling/hematoma anterior to the LEFT orbit. No evidence of intraorbital hemorrhage. Other: None. CT CERVICAL SPINE FINDINGS Alignment: Anatomic posterior alignment. Straightening of the usual cervical lordosis. Facet joints anatomically aligned throughout with severe diffuse degenerative changes. Skull base and vertebrae: No fractures identified involving the cervical spine. Coronal reformatted images demonstrate an intact craniocervical junction, intact dens and intact  lateral masses throughout. Degenerative changes at the C1-C2 articulation with calcified pannus POSTERIOR to the dens. Soft tissues and spinal canal: No evidence of paraspinous or spinal canal hematoma. No evidence of spinal stenosis. Disc levels: Severe disc space narrowing and associated endplate hypertrophic changes at C6-7. Moderate disc space narrowing at C5-6 and C7-T1. Calcification within the C2-3 disc. Combination of facet and uncinate hypertrophy account for multilevel foraminal stenoses including severe BILATERAL C3-4, moderate BILATERAL C4-5, severe BILATERAL C5-6, severe BILATERAL C6-7. Upper chest: Visualized lung apices clear. Mild atherosclerosis involving the visualized proximal great vessels. Other: DISH involving the visualized UPPER thoracic spine. BILATERAL cervical carotid atherosclerosis. IMPRESSION: 1. No acute intracranial abnormality. 2. Moderate generalized atrophy. 3. No cervical spine fractures identified. 4. Multilevel degenerative disc disease, spondylosis and foraminal stenoses throughout the cervical spine as detailed above. 5. Preseptal soft tissue swelling/hematoma anterior to the LEFT orbit without evidence of intraorbital hemorrhage.  Electronically Signed   By: Evangeline Dakin M.D.   On: 10/11/2019 18:10    Assessment & Plan:   Silver was seen today for anemia and hypothyroidism.  Diagnoses and all orders for this visit:  Deficiency anemia- His H/H are stable. -     CBC with Differential/Platelet; Future -     CBC with Differential/Platelet  Acquired hypothyroidism- His TSH is in the normal range.  He will stay on the current dose of levothyroxine. -     TSH; Future -     TSH -     levothyroxine (SYNTHROID) 88 MCG tablet; Take 1 tablet (88 mcg total) by mouth daily.  Chronic idiopathic constipation- Labs are negative for secondary causes.  Will treat with lubiprostone. -     Magnesium; Future -     Basic metabolic panel; Future -     Basic metabolic panel -      Magnesium -     lubiprostone (AMITIZA) 24 MCG capsule; Take 1 capsule (24 mcg total) by mouth 2 (two) times daily with a meal.  Stage 3b chronic kidney disease ([place])- He is avoiding nephrotoxic agents.   I have discontinued Cristopher Estimable. Tith's atorvastatin. I am also having him start on lubiprostone. Additionally, I am having him maintain his ramipril, acetaminophen, hydrOXYzine, venlafaxine XR, celecoxib, tamsulosin, ALPRAZolam, LORazepam, buPROPion ER, temazepam, and levothyroxine.  Meds ordered this encounter  Medications   levothyroxine (SYNTHROID) 88 MCG tablet    Sig: Take 1 tablet (88 mcg total) by mouth daily.    Dispense:  90 tablet    Refill:  1   lubiprostone (AMITIZA) 24 MCG capsule    Sig: Take 1 capsule (24 mcg total) by mouth 2 (two) times daily with a meal.    Dispense:  180 capsule    Refill:  1     Follow-up: No follow-ups on file.  Scarlette Calico, MD

## 2021-08-27 ENCOUNTER — Other Ambulatory Visit: Payer: Self-pay

## 2021-08-27 ENCOUNTER — Telehealth (HOSPITAL_BASED_OUTPATIENT_CLINIC_OR_DEPARTMENT_OTHER): Payer: Medicare Other | Admitting: Psychiatry

## 2021-08-27 DIAGNOSIS — F325 Major depressive disorder, single episode, in full remission: Secondary | ICD-10-CM

## 2021-08-27 MED ORDER — ALPRAZOLAM 0.5 MG PO TABS: 0.2500 mg | ORAL_TABLET | Freq: Every evening | ORAL | 3 refills | Status: DC | PRN

## 2021-08-27 MED ORDER — TEMAZEPAM 15 MG PO CAPS: 15.0000 mg | ORAL_CAPSULE | Freq: Every evening | ORAL | 3 refills | Status: DC | PRN

## 2021-08-27 MED ORDER — LORAZEPAM 0.5 MG PO TABS: 0.5000 mg | ORAL_TABLET | Freq: Every evening | ORAL | 3 refills | Status: DC | PRN

## 2021-08-27 MED ORDER — BUPROPION HCL ER (SR) 100 MG PO TB12: ORAL_TABLET | ORAL | 4 refills | Status: DC

## 2021-08-27 NOTE — Progress Notes (Addendum)
Patient ID: Benjamin Hahn, male   DOB: September 26, 1932, 88 y.o.   MRN: [id number] [facility] MD/PA/NP OP Progress Note  08/27/2021 3:37 PM Benjamin Hahn  MRN:  [id number]  Chief Comp  Today the patient is actually doing quite well.  His mood is good.  He has no anxiety.  He had good holidays.  He no longer drives.  His wife takes him everywhere.  He still works about 4 to 6 hours every day.  His wife takes him to work at about 7 or 8 in the morning he works about 4 comes home has dinner and watches television.  He is sleeping well as long as he takes the Restoril or the Ativan.  He does not to take them both at the same time.  He takes Wellbutrin 100 mg slow release 2 in the morning.  He is enjoying life.  He gets along well with his family.  He still wants to continue working.  The patient drinks no alcohol at all.  He had a subdural hematoma in the past and usually walks with a walker.  He actually cognitively is functioning reasonably well.  He is friendly and engaging.  Past Medical History:  Diagnosis Date   Anxiety    Arthritis    might be in back, no problems   BPH (benign prostatic hyperplasia)    Depression 1990s   GERD (gastroesophageal reflux disease)    occasional   Hypercholesteremia    controled   Hypothyroid    MI (myocardial infarction) [facility])    Shingles May 2014   "Right face, still has some"   Temporal arteritis (HCC)    Transient blindness of both eyes     Past Surgical History:  Procedure Laterality Date   ARTERY BIOPSY Right 06/20/2013   Procedure: BIOPSY TEMPORAL ARTERY RIGHT;  Surgeon: Velora Heckler, MD;  Location: [facility] ORS;  Service: General;  Laterality: Right;   CARDIAC CATHETERIZATION N/A 09/02/2015   Procedure: Left Heart Cath and Coronary Angiography;  Surgeon: Rinaldo Cloud, MD;  Location: [facility] INVASIVE CV LAB;  Service: Cardiovascular;  Laterality: N/A;   CARDIAC CATHETERIZATION N/A 09/02/2015   Procedure: Coronary Stent Intervention;  Surgeon: Rinaldo Cloud, MD;   Location: [facility] INVASIVE CV LAB;  Service: Cardiovascular;  Laterality: N/A;  1.  mid RCA      (3.0/28mm Xience) 2.  Mid LAD      (3.0/23mm Xience)   CRANIOTOMY N/A 04/19/2019   Procedure: CRANIOTOMY HEMATOMA EVACUATION SUBDURAL;  Surgeon: Shirlean Kelly, MD;  Location: [facility] OR;  Service: Neurosurgery;  Laterality: N/A;  CRANIOTOMY HEMATOMA EVACUATION SUBDURAL   None      Family Psychiatric History:   Family History:  Family History  Problem Relation Age of Onset   Stroke Father        Died, 66s   Stroke Sister        Living, 70   Heart disease Mother        Died, 13   Healthy Daughter    Diabetes Maternal Grandmother    Hypertension Neg Hx     Social History:  Social History   Socioeconomic History   Marital status: Married    Spouse name: Not on file   Number of children: Not on file   Years of education: Not on file   Highest education level: Not on file  Occupational History   Not on file  Tobacco Use   Smoking status: Former    Types: Cigarettes  Quit date: 08/24/1966    Years since quitting: 55.0   Smokeless tobacco: Never  Substance and Sexual Activity   Alcohol use: Not Currently    Comment: socially   Drug use: Never   Sexual activity: Not on file  Other Topics Concern   Not on file  Social History Narrative   ** Merged History Encounter **       Currently works as a Scientist, clinical (histocompatibility and immunogenetics).  Lives with wife and they have two healthy daughters.   Social Determinants of Health   Financial Resource Strain: Low Risk    Difficulty of Paying Living Expenses: Not hard at all  Food Insecurity: No Food Insecurity   Worried About Programme researcher, broadcasting/film/video in the Last Year: Never true   Ran Out of Food in the Last Year: Never true  Transportation Needs: No Transportation Needs   Lack of Transportation (Medical): No   Lack of Transportation (Non-Medical): No  Physical Activity: Inactive   Days of Exercise per Week: 0 days   Minutes of Exercise per Session: 0 min  Stress:  No Stress Concern Present   Feeling of Stress : Not at all  Social Connections: Socially Integrated   Frequency of Communication with Friends and Family: More than three times a week   Frequency of Social Gatherings with Friends and Family: More than three times a week   Attends Religious Services: More than 4 times per year   Active Member of Clubs or Organizations: Yes   Attends Banker Meetings: More than 4 times per year   Marital Status: Married    Allergies:  No Active Allergies   Metabolic Disorder Labs: Lab Results  Component Value Date   HGBA1C 5.5 04/14/2019   No results found for: PROLACTIN Lab Results  Component Value Date   CHOL 147 10/02/2020   TRIG 55.0 10/02/2020   HDL 79.30 10/02/2020   CHOLHDL 2 10/02/2020   VLDL 11.0 10/02/2020   LDLCALC 57 10/02/2020   LDLCALC 51 06/01/2019     Current Medications: Current Outpatient Medications  Medication Sig Dispense Refill   acetaminophen (TYLENOL) 325 MG tablet Take 325-650 mg by mouth every 6 (six) hours as needed for mild pain or headache.     ALPRAZolam (XANAX) 0.5 MG tablet Take 0.5 tablets (0.25 mg total) by mouth at bedtime as needed. 25 tablet 3   buPROPion ER (WELLBUTRIN SR) 100 MG 12 hr tablet Take two tablets (200 mg total) by mouth each morning. 60 tablet 4   celecoxib (CELEBREX) 100 MG capsule Take 100 mg by mouth 2 (two) times daily.     hydrOXYzine (ATARAX/VISTARIL) 25 MG tablet Take 1 tablet (25 mg total) by mouth at bedtime. (Patient taking differently: Take 25 mg by mouth at bedtime as needed.) 30 tablet 1   levothyroxine (SYNTHROID) 88 MCG tablet Take 1 tablet (88 mcg total) by mouth daily. 90 tablet 1   LORazepam (ATIVAN) 0.5 MG tablet Take 1 tablet (0.5 mg total) by mouth at bedtime as needed. 30 tablet 3   lubiprostone (AMITIZA) 24 MCG capsule Take 1 capsule (24 mcg total) by mouth 2 (two) times daily with a meal. 180 capsule 1   ramipril (ALTACE) 1.25 MG capsule Take 1.25 mg by  mouth daily after supper.     tamsulosin (FLOMAX) 0.4 MG CAPS capsule Take 0.8 mg by mouth daily.     temazepam (RESTORIL) 15 MG capsule Take 1 capsule (15 mg total) by mouth at bedtime as needed  for sleep. 30 capsule 3   No current facility-administered medications for this visit.    Neurologic: Headache: No Seizure: No Paresthesias: No  Musculoskeletal: Strength & Muscle Tone: within normal limits Gait & Station: normal Patient leans: NA  Psychiatric Specialty Exam: ROS  There were no vitals taken for this visit.There is no height or weight on file to calculate BMI.  General Appearance: Fairly Groomed  Eye Contact:  Good  Speech:  Clear and Coherent  Volume:  Normal  Mood:  Euthymic  Affect:  Appropriate  Thought Process:  Coherent  Orientation:  Full (Time, Place, and Person)  Thought Content:  WDL  Suicidal Thoughts:  No  Homicidal Thoughts:  No  Memory:  NA  Judgement:  Good  Insight:  Good  Psychomotor Activity:  Normal  Concentration:  Good  Recall:  Good  Fund of Knowledge: Good  Language: Good  Akathisia:  No  Handed:  Right  AIMS (if indicated):    Assets:  Desire for Improvement  ADL's:  Intact  Cognition: WNL  Sleep:      Treatment/plan Virtual Visit via Telephone Note  I connected with Benjamin Hahn on 05/11/23 at  3:00 PM EST by telephone and verified that I am speaking with the correct person using two identifiers.  Location: Patient: home Provider: office   I discussed the limitations, risks, security and privacy concerns of performing an evaluation and management service by telephone and the availability of in person appointments. I also discussed with the patient that there may be a patient responsible charge related to this service. The patient expressed understanding and agreed to proceed.     I discussed the assessment and treatment plan with the patient. The patient was provided an opportunity to ask questions and all were  answered. The patient agreed with the plan and demonstrated an understanding of the instructions.   The patient was advised to call back or seek an in-person evaluation if the symptoms worsen or if the condition fails to improve as anticipated.  I provided 30 minutes of non-face-to-face time during this encounter.   Gypsy Balsam, MD   Today's date is August 27, 2021.  This patient's first problem is major depression remission.  He takes Wellbutrin 100 mg 1 release 2 in the morning.  His second problem is insomnia.  He takes Restoril 15 mg or takes Ativan 0.5 mg.  He alternates between the 2.  He also has available small dose of Xanax for occasional anxiety.  He also has available Vistaril which he rarely takes.  The patient is functioning quite well.  Ideally have not been seen in the office but he was too much of an imposition I have no problem seeing this patient to a phone visit.  The patient is functioning well and is not suicidal. 08/27/2021, 3:37 PM [facility] MD Progress Note  08/27/2021 3:37 PM Benjamin Hahn  MRN:  [id number] Subjective:  Feels well Principal Problem: Major depression, chronic, mild/residual Diagnosis:  Major depression, recurrent residual Today the patient is doing fairly well. He's been having some medical problems. He recently went for Thanksgiving to a length but unfortunately had a urinary tract infection. He also since then has been coughing and recently started on antibiotic. Emotionally he is fairly stable. He denies daily depression. He stays very active at work. He's been unable to exercise. The patient is sleeping well and has an increase in his appetite. His energy is good. Is no problems thinking or  concentrating. He is recently having a productive cough but is no shortness of breath. He denies any chest pain. Is a stable relationship with his wife. His kidneys are good. He has 4 grandchildren. Everybody in his home is good. He only issue is that a number  mornings he awakens and he feels oversedated. It occurs point of his wife feels uncomfortable with him driving will taken to work. Today reviewed his medications and is evident that he likely needs reduction. He also notes that his memory has change in his and is good. 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 Patient Active Problem List   Diagnosis Date Noted   Stage 3b chronic kidney disease (HCC) [N18.32] 08/19/2021   Psychophysiological insomnia [F51.04] 12/07/2019   Deficiency anemia [D53.9] 06/01/2019   Vitamin D deficiency [E55.9] 06/01/2019   Chronic idiopathic constipation [K59.04] 06/01/2019   Mild intermittent asthma with acute exacerbation [J45.21] 02/08/2018   Hypothyroidism [E03.9] 03/14/2013   Hyperlipidemia LDL goal <70 [E78.5] 03/14/2013   Benign prostatic hyperplasia [N40.0] 03/14/2013   Hypogonadism, male [E29.1] 03/14/2013   Erectile dysfunction [N52.9] 03/14/2013   Total Time spent with patient: 30 minutes  Past Psychiatric History:   Past Medical History:  Past Medical History:  Diagnosis Date   Anxiety    Arthritis    might be in back, no problems   BPH (benign prostatic hyperplasia)    Depression 1990s   GERD (gastroesophageal reflux disease)    occasional   Hypercholesteremia    controled   Hypothyroid    MI (myocardial infarction) [facility])    Shingles May 2014   "Right face, still has some"   Temporal arteritis (HCC)    Transient blindness of both eyes     Past Surgical History:  Procedure Laterality Date   ARTERY BIOPSY Right 06/20/2013   Procedure: BIOPSY TEMPORAL ARTERY RIGHT;  Surgeon: Velora Heckler, MD;  Location: [facility] ORS;  Service: General;  Laterality: Right;   CARDIAC CATHETERIZATION N/A 09/02/2015   Procedure: Left Heart Cath and Coronary Angiography;  Surgeon: Rinaldo Cloud, MD;  Location: [facility] INVASIVE CV LAB;  Service: Cardiovascular;  Laterality: N/A;   CARDIAC CATHETERIZATION N/A 09/02/2015    Procedure: Coronary Stent Intervention;  Surgeon: Rinaldo Cloud, MD;  Location: [facility] INVASIVE CV LAB;  Service: Cardiovascular;  Laterality: N/A;  1.  mid RCA      (3.0/28mm Xience) 2.  Mid LAD      (3.0/23mm Xience)   CRANIOTOMY N/A 04/19/2019   Procedure: CRANIOTOMY HEMATOMA EVACUATION SUBDURAL;  Surgeon: Shirlean Kelly, MD;  Location: [facility] OR;  Service: Neurosurgery;  Laterality: N/A;  CRANIOTOMY HEMATOMA EVACUATION SUBDURAL   None     Family History:  Family History  Problem Relation Age of Onset   Stroke Father        Died, 33s   Stroke Sister        Living, 76   Heart disease Mother        Died, 61   Healthy Daughter    Diabetes Maternal Grandmother    Hypertension Neg Hx    Family Psychiatric  History:  Social History:  Social History   Substance and Sexual Activity  Alcohol Use Not Currently   Comment: socially     Social History   Substance and Sexual Activity  Drug Use Never    Social History   Socioeconomic History   Marital status: Married    Spouse name: Not on  file   Number of children: Not on file   Years of education: Not on file   Highest education level: Not on file  Occupational History   Not on file  Tobacco Use   Smoking status: Former    Types: Cigarettes    Quit date: 08/24/1966    Years since quitting: 55.0   Smokeless tobacco: Never  Substance and Sexual Activity   Alcohol use: Not Currently    Comment: socially   Drug use: Never   Sexual activity: Not on file  Other Topics Concern   Not on file  Social History Narrative   ** Merged History Encounter **       Currently works as a Scientist, clinical (histocompatibility and immunogenetics).  Lives with wife and they have two healthy daughters.   Social Determinants of Health   Financial Resource Strain: Low Risk    Difficulty of Paying Living Expenses: Not hard at all  Food Insecurity: No Food Insecurity   Worried About Programme researcher, broadcasting/film/video in the Last Year: Never true   Ran Out of Food in the Last Year: Never true   Transportation Needs: No Transportation Needs   Lack of Transportation (Medical): No   Lack of Transportation (Non-Medical): No  Physical Activity: Inactive   Days of Exercise per Week: 0 days   Minutes of Exercise per Session: 0 min  Stress: No Stress Concern Present   Feeling of Stress : Not at all  Social Connections: Socially Integrated   Frequency of Communication with Friends and Family: More than three times a week   Frequency of Social Gatherings with Friends and Family: More than three times a week   Attends Religious Services: More than 4 times per year   Active Member of [employer] or Organizations: Yes   Attends Engineer, structural: More than 4 times per year   Marital Status: Married   Additional Social History:                         Sleep: Good  Appetite:  Good  Current Medications: Current Outpatient Medications  Medication Sig Dispense Refill   acetaminophen (TYLENOL) 325 MG tablet Take 325-650 mg by mouth every 6 (six) hours as needed for mild pain or headache.     ALPRAZolam (XANAX) 0.5 MG tablet Take 0.5 tablets (0.25 mg total) by mouth at bedtime as needed. 25 tablet 3   buPROPion ER (WELLBUTRIN SR) 100 MG 12 hr tablet Take two tablets (200 mg total) by mouth each morning. 60 tablet 4   celecoxib (CELEBREX) 100 MG capsule Take 100 mg by mouth 2 (two) times daily.     hydrOXYzine (ATARAX/VISTARIL) 25 MG tablet Take 1 tablet (25 mg total) by mouth at bedtime. (Patient taking differently: Take 25 mg by mouth at bedtime as needed.) 30 tablet 1   levothyroxine (SYNTHROID) 88 MCG tablet Take 1 tablet (88 mcg total) by mouth daily. 90 tablet 1   LORazepam (ATIVAN) 0.5 MG tablet Take 1 tablet (0.5 mg total) by mouth at bedtime as needed. 30 tablet 3   lubiprostone (AMITIZA) 24 MCG capsule Take 1 capsule (24 mcg total) by mouth 2 (two) times daily with a meal. 180 capsule 1   ramipril (ALTACE) 1.25 MG capsule Take 1.25 mg by mouth daily after supper.      tamsulosin (FLOMAX) 0.4 MG CAPS capsule Take 0.8 mg by mouth daily.     temazepam (RESTORIL) 15 MG capsule Take 1 capsule (  15 mg total) by mouth at bedtime as needed for sleep. 30 capsule 3   No current facility-administered medications for this visit.    Lab Results:  No results found for this or any previous visit (from the past 48 hour(s)).  Physical Findings: AIMS:  , ,  ,  ,    CIWA:    COWS:     Musculoskeletal: Strength & Muscle Tone: within normal limits Gait & Station: normal Patient leans: Right  Psychiatric Specialty Exam: ROS  There were no vitals taken for this visit.There is no height or weight on file to calculate BMI.  General Appearance: Negative  Eye Contact::  Good  Speech:  Clear and Coherent  Volume:  Normal  Mood:  NA  Affect:  Appropriate  Thought Process:  Coherent  Orientation:  Full (Time, Place, and Person)  Thought Content:  WDL  Suicidal Thoughts:  No  Homicidal Thoughts:  No  Memory:  NA  Judgement:  Good  Insight:  Good  Psychomotor Activity:  Normal  Concentration:  Good  Recall:  Good  Fund of Knowledge:Good  Language: Good  Akathisia:  No  Handed:  Right  AIMS (if indicated):     Assets:  Desire for Improvement  ADL's:  Intact  Cognition: WNL  Sleep:      [facility] MD Progress Note  08/27/2021 3:37 PM Benjamin Hahn  MRN:  [id number] Subjective:  Feels well Principal Problem: Major depression, chronic, mild/residual Diagnosis:  Major depression, recurrent residual Today the patient is doing fairly well. He's been having some medical problems. He recently went for Thanksgiving to a length but unfortunately had a urinary tract infection. He also since then has been coughing and recently started on antibiotic. Emotionally he is fairly stable. He denies daily depression. He stays very active at work. He's been unable to exercise. The patient is sleeping well and has an increase in his appetite. His energy is  good. Is no problems thinking or concentrating. He is recently having a productive cough but is no shortness of breath. He denies any chest pain. Is a stable relationship with his wife. His kidneys are good. He has 4 grandchildren. Everybody in his home is good. He only issue is that a number mornings he awakens and he feels oversedated. It occurs point of his wife feels uncomfortable with him driving will taken to work. Today reviewed his medications and is evident that he likely needs reduction. He also notes that his memory has change in his and is good. 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 Patient Active Problem List   Diagnosis Date Noted   Stage 3b chronic kidney disease (HCC) [N18.32] 08/19/2021   Psychophysiological insomnia [F51.04] 12/07/2019   Deficiency anemia [D53.9] 06/01/2019   Vitamin D deficiency [E55.9] 06/01/2019   Chronic idiopathic constipation [K59.04] 06/01/2019   Mild intermittent asthma with acute exacerbation [J45.21] 02/08/2018   Hypothyroidism [E03.9] 03/14/2013   Hyperlipidemia LDL goal <70 [E78.5] 03/14/2013   Benign prostatic hyperplasia [N40.0] 03/14/2013   Hypogonadism, male [E29.1] 03/14/2013   Erectile dysfunction [N52.9] 03/14/2013   Total Time spent with patient: 30 minutes  Past Psychiatric History:   Past Medical History:  Past Medical History:  Diagnosis Date   Anxiety    Arthritis    might be in back, no problems   BPH (benign prostatic hyperplasia)    Depression 1990s   GERD (gastroesophageal reflux disease)  occasional   Hypercholesteremia    controled   Hypothyroid    MI (myocardial infarction) [facility])    Shingles May 2014   "Right face, still has some"   Temporal arteritis (HCC)    Transient blindness of both eyes     Past Surgical History:  Procedure Laterality Date   ARTERY BIOPSY Right 06/20/2013   Procedure: BIOPSY TEMPORAL ARTERY RIGHT;  Surgeon: Velora Heckler, MD;   Location: [facility];  Service: General;  Laterality: Right;   CARDIAC CATHETERIZATION N/A 09/02/2015   Procedure: Left Heart Cath and Coronary Angiography;  Surgeon: Rinaldo Cloud, MD;  Location: [facility] INVASIVE CV LAB;  Service: Cardiovascular;  Laterality: N/A;   CARDIAC CATHETERIZATION N/A 09/02/2015   Procedure: Coronary Stent Intervention;  Surgeon: Rinaldo Cloud, MD;  Location: [facility] INVASIVE CV LAB;  Service: Cardiovascular;  Laterality: N/A;  1.  mid RCA      (3.0/28mm Xience) 2.  Mid LAD      (3.0/23mm Xience)   CRANIOTOMY N/A 04/19/2019   Procedure: CRANIOTOMY HEMATOMA EVACUATION SUBDURAL;  Surgeon: Shirlean Kelly, MD;  Location: [facility] OR;  Service: Neurosurgery;  Laterality: N/A;  CRANIOTOMY HEMATOMA EVACUATION SUBDURAL   None     Family History:  Family History  Problem Relation Age of Onset   Stroke Father        Died, 53s   Stroke Sister        Living, 3   Heart disease Mother        Died, 46   Healthy Daughter    Diabetes Maternal Grandmother    Hypertension Neg Hx    Family Psychiatric  History:  Social History:  Social History   Substance and Sexual Activity  Alcohol Use Not Currently   Comment: socially     Social History   Substance and Sexual Activity  Drug Use Never    Social History   Socioeconomic History   Marital status: Married    Spouse name: Not on file   Number of children: Not on file   Years of education: Not on file   Highest education level: Not on file  Occupational History   Not on file  Tobacco Use   Smoking status: Former    Types: Cigarettes    Quit date: 08/24/1966    Years since quitting: 55.0   Smokeless tobacco: Never  Substance and Sexual Activity   Alcohol use: Not Currently    Comment: socially   Drug use: Never   Sexual activity: Not on file  Other Topics Concern   Not on file  Social History Narrative   ** Merged History Encounter **       Currently works as a Scientist, clinical (histocompatibility and immunogenetics).  Lives with wife and they have two healthy  daughters.   Social Determinants of Health   Financial Resource Strain: Low Risk    Difficulty of Paying Living Expenses: Not hard at all  Food Insecurity: No Food Insecurity   Worried About Programme researcher, broadcasting/film/video in the Last Year: Never true   Ran Out of Food in the Last Year: Never true  Transportation Needs: No Transportation Needs   Lack of Transportation (Medical): No   Lack of Transportation (Non-Medical): No  Physical Activity: Inactive   Days of Exercise per Week: 0 days   Minutes of Exercise per Session: 0 min  Stress: No Stress Concern Present   Feeling of Stress : Not at all  Social Connections: Socially Integrated   Frequency of Communication with  Friends and Family: More than three times a week   Frequency of Social Gatherings with Friends and Family: More than three times a week   Attends Religious Services: More than 4 times per year   Active Member of [employer] or Organizations: Yes   Attends Engineer, structural: More than 4 times per year   Marital Status: Married   Additional Social History:                         Sleep: Good  Appetite:  Good  Current Medications: Current Outpatient Medications  Medication Sig Dispense Refill   acetaminophen (TYLENOL) 325 MG tablet Take 325-650 mg by mouth every 6 (six) hours as needed for mild pain or headache.     ALPRAZolam (XANAX) 0.5 MG tablet Take 0.5 tablets (0.25 mg total) by mouth at bedtime as needed. 25 tablet 3   buPROPion ER (WELLBUTRIN SR) 100 MG 12 hr tablet Take two tablets (200 mg total) by mouth each morning. 60 tablet 4   celecoxib (CELEBREX) 100 MG capsule Take 100 mg by mouth 2 (two) times daily.     hydrOXYzine (ATARAX/VISTARIL) 25 MG tablet Take 1 tablet (25 mg total) by mouth at bedtime. (Patient taking differently: Take 25 mg by mouth at bedtime as needed.) 30 tablet 1   levothyroxine (SYNTHROID) 88 MCG tablet Take 1 tablet (88 mcg total) by mouth daily. 90 tablet 1   LORazepam (ATIVAN)  0.5 MG tablet Take 1 tablet (0.5 mg total) by mouth at bedtime as needed. 30 tablet 3   lubiprostone (AMITIZA) 24 MCG capsule Take 1 capsule (24 mcg total) by mouth 2 (two) times daily with a meal. 180 capsule 1   ramipril (ALTACE) 1.25 MG capsule Take 1.25 mg by mouth daily after supper.     tamsulosin (FLOMAX) 0.4 MG CAPS capsule Take 0.8 mg by mouth daily.     temazepam (RESTORIL) 15 MG capsule Take 1 capsule (15 mg total) by mouth at bedtime as needed for sleep. 30 capsule 3   No current facility-administered medications for this visit.    Lab Results:  No results found for this or any previous visit (from the past 48 hour(s)).  Physical Findings: AIMS:  , ,  ,  ,    CIWA:    COWS:     Musculoskeletal: Strength & Muscle Tone: within normal limits Gait & Station: normal Patient leans: Right  Psychiatric Specialty Exam: ROS  There were no vitals taken for this visit.There is no height or weight on file to calculate BMI.  General Appearance: Negative  Eye Contact::  Good  Speech:  Clear and Coherent  Volume:  Normal  Mood:  NA  Affect:  Appropriate  Thought Process:  Coherent  Orientation:  Full (Time, Place, and Person)  Thought Content:  WDL  Suicidal Thoughts:  No  Homicidal Thoughts:  No  Memory:  NA  Judgement:  Good  Insight:  Good  Psychomotor Activity:  Normal  Concentration:  Good  Recall:  Good  Fund of Knowledge:Good  Language: Good  Akathisia:  No  Handed:  Right  AIMS (if indicated):     Assets:  Desire for Improvement  ADL's:  Intact  Cognition: WNL  Sleep:      Treatment Plan Summary: This patient's first problem is that of major depression.  He will continue taking Wellbutrin 150 mg and Effexor as prescribed.  His second problem is that of insomnia.  The patient is doing very well taking Restoril and will continue taking that.  He has episodic anxiety.  For that he takes Xanax 0.5 mg a half as needed.  I shared with him that he can also take  Vistaril instead of the Xanax.  In reality I do not mind if he takes some Vistaril and Xanax both of them on a as needed basis.  Will stay on Restoril for sleep.  To be seen again in a few months.  The patient actually is doing better than usual.

## 2021-09-25 DIAGNOSIS — H524 Presbyopia: Secondary | ICD-10-CM | POA: Diagnosis not present

## 2021-09-25 DIAGNOSIS — H52223 Regular astigmatism, bilateral: Secondary | ICD-10-CM | POA: Diagnosis not present

## 2021-09-25 DIAGNOSIS — H5203 Hypermetropia, bilateral: Secondary | ICD-10-CM | POA: Diagnosis not present

## 2021-09-25 DIAGNOSIS — I10 Essential (primary) hypertension: Secondary | ICD-10-CM | POA: Diagnosis not present

## 2021-10-10 ENCOUNTER — Telehealth: Payer: Medicare Other

## 2021-12-30 ENCOUNTER — Telehealth (HOSPITAL_BASED_OUTPATIENT_CLINIC_OR_DEPARTMENT_OTHER): Payer: Medicare Other | Admitting: Psychiatry

## 2021-12-30 DIAGNOSIS — F325 Major depressive disorder, single episode, in full remission: Secondary | ICD-10-CM

## 2021-12-30 MED ORDER — TEMAZEPAM 15 MG PO CAPS: 15.0000 mg | ORAL_CAPSULE | Freq: Every evening | ORAL | 3 refills | Status: DC | PRN

## 2021-12-30 MED ORDER — ALPRAZOLAM 0.5 MG PO TABS: 0.2500 mg | ORAL_TABLET | Freq: Every evening | ORAL | 3 refills | Status: DC | PRN

## 2021-12-30 MED ORDER — LORAZEPAM 0.5 MG PO TABS: 0.5000 mg | ORAL_TABLET | Freq: Every evening | ORAL | 3 refills | Status: DC | PRN

## 2021-12-30 MED ORDER — BUPROPION HCL ER (SR) 100 MG PO TB12: ORAL_TABLET | ORAL | 4 refills | Status: DC

## 2021-12-30 NOTE — Progress Notes (Signed)
Patient ID: Benjamin Hahn, male   DOB: 10/11/1932, 88 y.o.   MRN: [id number] ?[place] MD/PA/NP OP Progress Note ? ?12/30/2021 2:28 PM ?Benjamin Hahn  ?MRN:  [id number] ? ?Chief Comp ? ?Today the patient is at his baseline.  He is doing well.  He is happy with life.  He denies daily depression.  He says his medicines are working great.  He feels happy about life.  He takes Wellbutrin in the morning.  At night for sleep either takes Restoril or he takes an Ativan but not both.  On a rare patient will take a Xanax when he is overwhelmed with anxiety.  For the most part he is doing extremely well.  He is happily married.  His wife is doing well.  He still works 3 to 4 hours every day.  He likes what he does.  He drinks no alcohol and uses no drugs and his health is stable.  He is clearly at his baseline. ? ?Past Medical History:  ?Diagnosis Date  ? Anxiety   ? Arthritis   ? might be in back, no problems  ? BPH (benign prostatic hyperplasia)   ? Depression 1990s  ? GERD (gastroesophageal reflux disease)   ? occasional  ? Hypercholesteremia   ? controled  ? Hypothyroid   ? MI (myocardial infarction) ([place])   ? Shingles May 2014  ? "Right face, still has some"  ? Temporal arteritis ([place])   ? Transient blindness of both eyes   ?  ?Past Surgical History:  ?Procedure Laterality Date  ? ARTERY BIOPSY Right 06/20/2013  ? Procedure: BIOPSY TEMPORAL ARTERY RIGHT;  Surgeon: Earnstine Regal, MD;  [facility];  Service: General;  Laterality: Right;  ? CARDIAC CATHETERIZATION N/A 09/02/2015  ? Procedure: Left Heart Cath and Coronary Angiography;  Surgeon: Charolette Forward, MD;  Location: [place] [facility];  Service: Cardiovascular;  Laterality: N/A;  ? CARDIAC CATHETERIZATION N/A 09/02/2015  ? Procedure: Coronary Stent Intervention;  Surgeon: Charolette Forward, MD;  Location: [place] [facility];  Service: Cardiovascular;  Laterality: N/A;  1.  mid RCA ?     (3.0/28mm Xience) ?2.  Mid LAD ?     (3.0/23mm Xience)  ? CRANIOTOMY N/A 04/19/2019  ?  Procedure: CRANIOTOMY HEMATOMA EVACUATION SUBDURAL;  Surgeon: Jovita Gamma, MD;  Location: [facility];  Service: Neurosurgery;  Laterality: N/A;  CRANIOTOMY HEMATOMA EVACUATION SUBDURAL  ? None    ? ? ?Family Psychiatric History:  ? ?Family History:  ?Family History  ?Problem Relation Age of Onset  ? Stroke Father   ?     Died, 80s  ? Stroke Sister   ?     Living, 75  ? Heart disease Mother   ?     Died, 92  ? Healthy Daughter   ? Diabetes Maternal Grandmother   ? Hypertension Neg Hx   ? ? ?Social History:  ?Social History  ? ?Socioeconomic History  ? Marital status: Married  ?  Spouse name: Not on file  ? Number of children: Not on file  ? Years of education: Not on file  ? Highest education level: Not on file  ?Occupational History  ? Not on file  ?Tobacco Use  ? Smoking status: Former  ?  Types: Cigarettes  ?  Quit date: 08/24/1966  ?  Years since quitting: 55.3  ? Smokeless tobacco: Never  ?Substance and Sexual Activity  ? Alcohol use: Not Currently  ?  Comment: socially  ?  Drug use: Never  ? Sexual activity: Not on file  ?Other Topics Concern  ? Not on file  ?Social History Narrative  ? ** Merged History Encounter **  ?    ? Currently works as a Midwife.  Lives with wife and they have two healthy daughters.  ? ?Social Determinants of Health  ? ?Financial Resource Strain: Low Risk   ? Difficulty of Paying Living Expenses: Not hard at all  ?Food Insecurity: No Food Insecurity  ? Worried About Charity fundraiser in the Last Year: Never true  ? Ran Out of Food in the Last Year: Never true  ?Transportation Needs: No Transportation Needs  ? Lack of Transportation (Medical): No  ? Lack of Transportation (Non-Medical): No  ?Physical Activity: Inactive  ? Days of Exercise per Week: 0 days  ? Minutes of Exercise per Session: 0 min  ?Stress: No Stress Concern Present  ? Feeling of Stress : Not at all  ?Social Connections: Socially Integrated  ? Frequency of Communication with Friends and Family: More than three times a  week  ? Frequency of Social Gatherings with Friends and Family: More than three times a week  ? Attends Religious Services: More than 4 times per year  ? Active Member of Clubs or Organizations: Yes  ? Attends Archivist Meetings: More than 4 times per year  ? Marital Status: Married  ? ? ?Allergies:  ?No Active Allergies ? ? ?Metabolic Disorder Labs: ?Lab Results  ?Component Value Date  ? HGBA1C 5.5 04/14/2019  ? ?No results found for: PROLACTIN ?Lab Results  ?Component Value Date  ? CHOL 147 10/02/2020  ? TRIG 55.0 10/02/2020  ? HDL 79.30 10/02/2020  ? CHOLHDL 2 10/02/2020  ? VLDL 11.0 10/02/2020  ? [place] 57 10/02/2020  ? LDLCALC 51 06/01/2019  ?  ? ?Current Medications: ?Current Outpatient Medications  ?Medication Sig Dispense Refill  ? acetaminophen (TYLENOL) 325 MG tablet Take 325-650 mg by mouth every 6 (six) hours as needed for mild pain or headache.    ? ALPRAZolam (XANAX) 0.5 MG tablet Take 0.5 tablets (0.25 mg total) by mouth at bedtime as needed. 25 tablet 3  ? buPROPion ER (WELLBUTRIN SR) 100 MG 12 hr tablet Take two tablets (200 mg total) by mouth each morning. 60 tablet 4  ? celecoxib (CELEBREX) 100 MG capsule Take 100 mg by mouth 2 (two) times daily.    ? hydrOXYzine (ATARAX/VISTARIL) 25 MG tablet Take 1 tablet (25 mg total) by mouth at bedtime. (Patient taking differently: Take 25 mg by mouth at bedtime as needed.) 30 tablet 1  ? levothyroxine (SYNTHROID) 88 MCG tablet Take 1 tablet (88 mcg total) by mouth daily. 90 tablet 1  ? LORazepam (ATIVAN) 0.5 MG tablet Take 1 tablet (0.5 mg total) by mouth at bedtime as needed. 30 tablet 3  ? lubiprostone (AMITIZA) 24 MCG capsule Take 1 capsule (24 mcg total) by mouth 2 (two) times daily with a meal. 180 capsule 1  ? ramipril (ALTACE) 1.25 MG capsule Take 1.25 mg by mouth daily after supper.    ? tamsulosin (FLOMAX) 0.4 MG CAPS capsule Take 0.8 mg by mouth daily.    ? temazepam (RESTORIL) 15 MG capsule Take 1 capsule (15 mg total) by mouth at  bedtime as needed for sleep. 30 capsule 3  ? ?No current facility-administered medications for this visit.  ? ? ?Neurologic: ?Headache: No ?Seizure: No ?Paresthesias: No ? ?Musculoskeletal: ?Strength & Muscle Tone: within normal limits ?  Gait & Station: normal ?Patient leans: NA ? ?Psychiatric Specialty Exam: ?ROS  ?There were no vitals taken for this visit.There is no height or weight on file to calculate BMI.  ?General Appearance: Fairly Groomed  ?Eye Contact:  Good  ?Speech:  Clear and Coherent  ?Volume:  Normal  ?Mood:  Euthymic  ?Affect:  Appropriate  ?Thought Process:  Coherent  ?Orientation:  Full (Time, Place, and Person)  ?Thought Content:  WDL  ?Suicidal Thoughts:  No  ?Homicidal Thoughts:  No  ?Memory:  NA  ?Judgement:  Good  ?Insight:  Good  ?Psychomotor Activity:  Normal  ?Concentration:  Good  ?Recall:  Good  ?Fund of Knowledge: Good  ?Language: Good  ?Akathisia:  No  ?Handed:  Right  ?AIMS (if indicated):    ?Assets:  Desire for Improvement  ?ADL's:  Intact  ?Cognition: WNL  ?Sleep:    ? ? ?Treatment/plan ? ? ?Today's date is Dec 30, 2021.  Today the patient was interviewed over the phone and is doing well.  He is positive and optimistic.  He will continue taking Wellbutrin 100 mg slow release 2 a day.  His second problem is insomnia.  He will continue taking Restoril or Ativan.  He will alternate between the 2.  He occasionally takes a Xanax but it is rare.  Patient is doing extremely well.  He will be evaluated again in 5 months. ?Virtual Visit via Telephone Note ? ?I connected with Benjamin Hahn on 12/30/21 at  2:00 PM EDT by telephone and verified that I am speaking with the correct person using two identifiers. ? ?Location: ?Patient: home ?Provider: office ?  ?I discussed the limitations, risks, security and privacy concerns of performing an evaluation and management service by telephone and the availability of in person appointments. I also discussed with the patient that there may be a  patient responsible charge related to this service. The patient expressed understanding and agreed to proceed. ? ? ?History of Present Illness: ? ?  ?Observations/Objective: ? ? ?Assessment and Plan: ? ? ?Fo

## 2022-01-21 ENCOUNTER — Other Ambulatory Visit: Payer: Self-pay | Admitting: Internal Medicine

## 2022-01-21 DIAGNOSIS — E039 Hypothyroidism, unspecified: Secondary | ICD-10-CM

## 2022-01-29 ENCOUNTER — Ambulatory Visit (INDEPENDENT_AMBULATORY_CARE_PROVIDER_SITE_OTHER): Payer: Medicare Other

## 2022-01-29 DIAGNOSIS — Z0000 Encounter for general adult medical examination without abnormal findings: Secondary | ICD-10-CM | POA: Diagnosis not present

## 2022-01-29 NOTE — Progress Notes (Signed)
I connected with Cyndie Mull today by telephone and verified that I am speaking with the correct person using two identifiers. Location patient: home Location provider: work Persons participating in the virtual visit: patient, provider.   I discussed the limitations, risks, security and privacy concerns of performing an evaluation and management service by telephone and the availability of in person appointments. I also discussed with the patient that there may be a patient responsible charge related to this service. The patient expressed understanding and verbally consented to this telephonic visit.    Interactive audio and video telecommunications were attempted between this provider and patient, however failed, due to patient having technical difficulties OR patient did not have access to video capability.  We continued and completed visit with audio only.  Some vital signs may be absent or patient reported.   Time Spent with patient on telephone encounter: 30 minutes  Subjective:   Benjamin Hahn is a 88 y.o. male who presents for Medicare Annual/Subsequent preventive examination.  Review of Systems     Cardiac Risk Factors include: advanced age (>22mn, >>41women);dyslipidemia;family history of premature cardiovascular disease;male gender     Objective:    There were no vitals filed for this visit. There is no height or weight on file to calculate BMI.     01/29/2022    3:31 PM 01/23/2021    3:58 PM 07/29/2020    8:50 AM 08/15/2019    8:08 AM 04/21/2019    2:01 PM 04/19/2019    2:20 PM 04/17/2019   10:09 PM  Advanced Directives  Does Patient Have a Medical Advance Directive? Yes Yes Yes Yes No No No  Type of Advance Directive Living will;Healthcare Power of [place]     Does patient want to make changes to medical advance directive? No - Patient declined Yes (MAU/Ambulatory/Procedural Areas - Information given) No - Patient  declined No - Patient declined     Copy of [place]? No - copy requested        Would patient like information on creating a medical advance directive?     Yes (Inpatient - patient defers creating a medical advance directive at this time - Information given) No - Patient declined No - Patient declined    Current Medications (verified) Outpatient Encounter Medications as of 01/29/2022  Medication Sig   acetaminophen (TYLENOL) 325 MG tablet Take 325-650 mg by mouth every 6 (six) hours as needed for mild pain or headache.   ALPRAZolam (XANAX) 0.5 MG tablet Take 0.5 tablets (0.25 mg total) by mouth at bedtime as needed.   buPROPion ER (WELLBUTRIN SR) 100 MG 12 hr tablet Take two tablets (200 mg total) by mouth each morning.   celecoxib (CELEBREX) 100 MG capsule Take 100 mg by mouth 2 (two) times daily.   hydrOXYzine (ATARAX/VISTARIL) 25 MG tablet Take 1 tablet (25 mg total) by mouth at bedtime. (Patient taking differently: Take 25 mg by mouth at bedtime as needed.)   levothyroxine (SYNTHROID) 88 MCG tablet Take 1 tablet (88 mcg total) by mouth daily.   LORazepam (ATIVAN) 0.5 MG tablet Take 1 tablet (0.5 mg total) by mouth at bedtime as needed.   lubiprostone (AMITIZA) 24 MCG capsule Take 1 capsule (24 mcg total) by mouth 2 (two) times daily with a meal.   ramipril (ALTACE) 1.25 MG capsule Take 1.25 mg by mouth daily after supper.   tamsulosin (FLOMAX) 0.4 MG CAPS capsule Take  0.8 mg by mouth daily.   temazepam (RESTORIL) 15 MG capsule Take 1 capsule (15 mg total) by mouth at bedtime as needed for sleep.   No facility-administered encounter medications on file as of 01/29/2022.    Allergies (verified) Patient has no known allergies.   History: Past Medical History:  Diagnosis Date   Anxiety    Arthritis    might be in back, no problems   BPH (benign prostatic hyperplasia)    Depression 1990s   GERD (gastroesophageal reflux disease)    occasional    Hypercholesteremia    controled   Hypothyroid    MI (myocardial infarction) [facility])    Shingles May 2014   "Right face, still has some"   Temporal arteritis ([place])    Transient blindness of both eyes    Past Surgical History:  Procedure Laterality Date   ARTERY BIOPSY Right 06/20/2013   Procedure: BIOPSY TEMPORAL ARTERY RIGHT;  Surgeon: Earnstine Regal, MD;  [facility];  Service: General;  Laterality: Right;   CARDIAC CATHETERIZATION N/A 09/02/2015   Procedure: Left Heart Cath and Coronary Angiography;  Surgeon: Charolette Forward, MD;  Location: [place] [facility];  Service: Cardiovascular;  Laterality: N/A;   CARDIAC CATHETERIZATION N/A 09/02/2015   Procedure: Coronary Stent Intervention;  Surgeon: Charolette Forward, MD;  Location: [place] [facility];  Service: Cardiovascular;  Laterality: N/A;  1.  mid RCA      (3.0/28mm Xience) 2.  Mid LAD      (3.0/23mm Xience)   CRANIOTOMY N/A 04/19/2019   Procedure: CRANIOTOMY HEMATOMA EVACUATION SUBDURAL;  Surgeon: Jovita Gamma, MD;  Location: [place];  Service: Neurosurgery;  Laterality: N/A;  CRANIOTOMY HEMATOMA EVACUATION SUBDURAL   None     Family History  Problem Relation Age of Onset   Stroke Father        Died, 70s   Stroke Sister        Living, 3   Heart disease Mother        Died, 44   Healthy Daughter    Diabetes Maternal Grandmother    Hypertension Neg Hx    Social History   Socioeconomic History   Marital status: Married    Spouse name: Not on file   Number of children: Not on file   Years of education: Not on file   Highest education level: Not on file  Occupational History   Not on file  Tobacco Use   Smoking status: Former    Types: Cigarettes    Quit date: 08/24/1966    Years since quitting: 55.4   Smokeless tobacco: Never  Substance and Sexual Activity   Alcohol use: Not Currently    Comment: socially   Drug use: Never   Sexual activity: Not on file  Other Topics Concern   Not on file  Social History Narrative    ** Merged History Encounter **       Currently works as a Midwife.  Lives with wife and they have two healthy daughters.   Social Determinants of Health   Financial Resource Strain: Low Risk  (01/29/2022)   Overall Financial Resource Strain (CARDIA)    Difficulty of Paying Living Expenses: Not hard at all  Food Insecurity: No Food Insecurity (01/29/2022)   Hunger Vital Sign    Worried About Running Out of Food in the Last Year: Never true    Ran Out of Food in the Last Year: Never true  Transportation Needs: Unknown (01/29/2022)  PRAPARE - Hydrologist (Medical): No    Lack of Transportation (Non-Medical): Not on file  Physical Activity: Inactive (01/29/2022)   Exercise Vital Sign    Days of Exercise per Week: 0 days    Minutes of Exercise per Session: 0 min  Stress: No Stress Concern Present (01/29/2022)   [place]    Feeling of Stress : Not at all  Social Connections: [place] (01/29/2022)   Social Connection and Isolation Panel [NHANES]    Frequency of Communication with Friends and Family: More than three times a week    Frequency of Social Gatherings with Friends and Family: More than three times a week    Attends Religious Services: More than 4 times per year    Active Member of [employer] or Organizations: Yes    Attends Music therapist: More than 4 times per year    Marital Status: Married    Tobacco Counseling Counseling given: Not Answered   Clinical Intake:     Pain : No/denies pain     BMI - recorded: 28.24 Nutritional Status: BMI 25 -29 Overweight Nutritional Risks: None Diabetes: No  How often do you need to have someone help you when you read instructions, pamphlets, or other written materials from your doctor or pharmacy?: 1 - Never 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 no  Interpreter Needed?:  No  Information entered by :: Lisette Abu, LPN.   Activities of Daily Living    01/29/2022    3:32 PM  In your present state of health, do you have any difficulty performing the following activities:  Hearing? 0  Vision? 0  Difficulty concentrating or making decisions? 0  Walking or climbing stairs? 1  Dressing or bathing? 0  Doing errands, shopping? 0  Preparing Food and eating ? N  Using the Toilet? N  In the past six months, have you accidently leaked urine? N  Do you have problems with loss of bowel control? N  Managing your Medications? N  Managing your Finances? N  Housekeeping or managing your Housekeeping? N    Patient Care Team: Janith Lima, MD as PCP - General (Internal Medicine) Janith Lima, MD (Internal Medicine) Marica Otter, [place] as Consulting Physician (Optometry) Sz[place], Darnelle Maffucci, [facility] as Pharmacist (Pharmacist)  Indicate any recent Medical Services you may have received from other than [facility] providers in the past year (date may be approximate).     Assessment:   This is a routine wellness examination for Benjamin Hahn.  Hearing/Vision screen Hearing Screening - Comments:: Patient denied any hearing difficulty.   No hearing aids.   Vision Screening - Comments:: Patient does wears readers to read small print and television. Eye exam done by: Marica Otter, OD.  Dietary issues and exercise activities discussed: Current Exercise Habits: The patient does not participate in regular exercise at present, Exercise limited by: None identified   Goals Addressed             This Visit's Progress    To maintain my current health status by continuing to eat healthy.        Depression Screen    01/29/2022    3:23 PM 08/19/2021    3:24 PM 01/23/2021    4:27 PM 10/02/2020    3:41 PM 12/07/2019    1:43 PM 05/24/2019    9:29 AM 07/28/2018    3:51  PM  PHQ 2/9 Scores  PHQ - 2 Score 0 0 0 0 1 0 3  PHQ- 9 Score  0  0 '7 2 13    '$ Fall Risk    01/29/2022    3:22  PM 01/23/2021    4:29 PM 10/02/2020    3:41 PM 05/24/2019    9:28 AM 07/28/2018    3:50 PM  Fall Risk   Falls in the past year? 0 0 0 1 0  Comment    last fall July 2020   Number falls in past yr: 0 0 0 1 0  Injury with Fall? 0 0 0  0  Risk for fall due to : Impaired balance/gait Impaired balance/gait   Impaired mobility  Follow up Falls evaluation completed Falls evaluation completed   Falls evaluation completed    FALL RISK PREVENTION PERTAINING TO THE HOME:  Any stairs in or around the home? Yes  If so, are there any without handrails? No  Home free of loose throw rugs in walkways, pet beds, electrical cords, etc? Yes  Adequate lighting in your home to reduce risk of falls? Yes   ASSISTIVE DEVICES UTILIZED TO PREVENT FALLS:  Life alert? No  Use of a cane, walker or w/c? Yes  Grab bars in the bathroom? No  Shower chair or bench in shower? Yes  Elevated toilet seat or a handicapped toilet? No   TIMED UP AND GO:  Was the test performed? No .  Length of time to ambulate 10 feet: n/a sec.   Appearance of gait: Patient not evaluated for gait during this visit.  Cognitive Function:        01/29/2022    3:33 PM  6CIT Screen  What Year? 0 points  What month? 0 points  What time? 0 points  Count back from 20 0 points  Months in reverse 0 points  Repeat phrase 0 points  Total Score 0 points    Immunizations Immunization History  Administered Date(s) Administered   Fluad Quad(high Dose 65+) 05/04/2019   Influenza, High Dose Seasonal PF 06/28/2014, 06/25/2016, 06/08/2017, 06/03/2018   Influenza,inj,Quad PF,6+ Mos 08/11/2013   Influenza-Unspecified 06/24/2021   PFIZER(Purple Top)SARS-COV-2 Vaccination 09/08/2019, 09/27/2019, 04/11/2020, 11/22/2020   Pfizer Covid-19 Vaccine Bivalent Booster 52yr & up 06/27/2021   Pneumococcal Conjugate-13 02/14/2014   Pneumococcal Polysaccharide-23 07/28/2018   Tdap 11/28/2014   Zoster, Live 02/01/2010    TDAP status: Up to date  Flu  Vaccine status: Up to date  Pneumococcal vaccine status: Up to date  Covid-19 vaccine status: Completed vaccines  Qualifies for Shingles Vaccine? Yes   Zostavax completed Yes   Shingrix Completed?: No.    Education has been provided regarding the importance of this vaccine. Patient has been advised to call insurance company to determine out of pocket expense if they have not yet received this vaccine. Advised may also receive vaccine at local pharmacy or Health Dept. Verbalized acceptance and understanding.  Screening Tests Health Maintenance  Topic Date Due   Zoster Vaccines- Shingrix (1 of 2) Never done   COVID-19 Vaccine (6 - Pfizer series) 10/25/2021   INFLUENZA VACCINE  03/24/2022   TETANUS/TDAP  11/27/2024   Pneumonia Vaccine 88 Years old  Completed   HPV VACCINES  Aged Out    Health Maintenance  Health Maintenance Due  Topic Date Due   Zoster Vaccines- Shingrix (1 of 2) Never done   COVID-19 Vaccine (6 - Pfizer series) 10/25/2021    Colorectal cancer screening: No  longer required.   Lung Cancer Screening: (Low Dose CT Chest recommended if Age 40-80 years, 30 pack-year currently smoking OR have quit w/in 15years.) does not qualify.   Lung Cancer Screening Referral: no  Additional Screening:  Hepatitis C Screening: does not qualify; Completed no  Vision Screening: Recommended annual ophthalmology exams for early detection of glaucoma and other disorders of the eye. Is the patient up to date with their annual eye exam?  Yes  Who is the provider or what is the name of the office in which the patient attends annual eye exams? Marica Otter, OD. If pt is not established with a provider, would they like to be referred to a provider to establish care? No .   Dental Screening: Recommended annual dental exams for proper oral hygiene  Community Resource Referral / Chronic Care Management: CRR required this visit?  No   CCM required this visit?  No      Plan:     I  have personally reviewed and noted the following in the patient's chart:   Medical and social history Use of alcohol, tobacco or illicit drugs  Current medications and supplements including opioid prescriptions. Patient is not currently taking opioid prescriptions. Functional ability and status Nutritional status Physical activity Advanced directives List of other physicians Hospitalizations, surgeries, and ER visits in previous 12 months Vitals Screenings to include cognitive, depression, and falls Referrals and appointments  In addition, I have reviewed and discussed with patient certain preventive protocols, quality metrics, and best practice recommendations. A written personalized care plan for preventive services as well as general preventive health recommendations were provided to patient.     Sheral Flow, LPN   09/01/6220   Nurse Notes:  There were no vitals filed for this visit. There is no height or weight on file to calculate BMI.

## 2022-01-29 NOTE — Patient Instructions (Signed)
Benjamin Hahn , Thank you for taking time to come for your Medicare Wellness Visit. I appreciate your ongoing commitment to your health goals. Please review the following plan we discussed and let me know if I can assist you in the future.   Screening recommendations/referrals: Colonoscopy: Discontinued due to age Recommended yearly ophthalmology/optometry visit for glaucoma screening and checkup Recommended yearly dental visit for hygiene and checkup  Vaccinations: Influenza vaccine: 06/24/2021 Pneumococcal vaccine: 02/14/2014, 07/28/2018 Tdap vaccine: 11/28/2014; due every 10 years Shingles vaccine: never done   Covid-19: 09/08/2019, 09/27/2019, 04/11/2020, 11/22/2020, 06/27/2021  Advanced directives: Yes; Please bring a copy of your health care power of attorney and living will to the office at your convenience.  Conditions/risks identified: Yes  Next appointment: Please schedule your next Medicare Wellness Visit with your Nurse Health Advisor in 1 year by calling [phone].  Preventive Care 29 Years and Older, Male Preventive care refers to lifestyle choices and visits with your health care provider that can promote health and wellness. What does preventive care include? A yearly physical exam. This is also called an annual well check. Dental exams once or twice a year. Routine eye exams. Ask your health care provider how often you should have your eyes checked. Personal lifestyle choices, including: Daily care of your teeth and gums. Regular physical activity. Eating a healthy diet. Avoiding tobacco and drug use. Limiting alcohol use. Practicing safe sex. Taking low doses of aspirin every day. Taking vitamin and mineral supplements as recommended by your health care provider. What happens during an annual well check? The services and screenings done by your health care provider during your annual well check will depend on your age, overall health, lifestyle risk factors, and family  history of disease. Counseling  Your health care provider may ask you questions about your: Alcohol use. Tobacco use. Drug use. Emotional well-being. Home and relationship well-being. Sexual activity. Eating habits. History of falls. Memory and ability to understand (cognition). Work and work Statistician. Screening  You may have the following tests or measurements: Height, weight, and BMI. Blood pressure. Lipid and cholesterol levels. These may be checked every 5 years, or more frequently if you are over 67 years old. Skin check. Lung cancer screening. You may have this screening every year starting at age 49 if you have a 30-pack-year history of smoking and currently smoke or have quit within the past 15 years. Fecal occult blood test (FOBT) of the stool. You may have this test every year starting at age 84. Flexible sigmoidoscopy or colonoscopy. You may have a sigmoidoscopy every 5 years or a colonoscopy every 10 years starting at age 32. Prostate cancer screening. Recommendations will vary depending on your family history and other risks. Hepatitis C blood test. Hepatitis B blood test. Sexually transmitted disease (STD) testing. Diabetes screening. This is done by checking your blood sugar (glucose) after you have not eaten for a while (fasting). You may have this done every 1-3 years. Abdominal aortic aneurysm (AAA) screening. You may need this if you are a current or former smoker. Osteoporosis. You may be screened starting at age 53 if you are at high risk. Talk with your health care provider about your test results, treatment options, and if necessary, the need for more tests. Vaccines  Your health care provider may recommend certain vaccines, such as: Influenza vaccine. This is recommended every year. Tetanus, diphtheria, and acellular pertussis (Tdap, Td) vaccine. You may need a Td booster every 10 years. Zoster vaccine. You may  need this after age 22. Pneumococcal  13-valent conjugate (PCV13) vaccine. One dose is recommended after age 21. Pneumococcal polysaccharide (PPSV23) vaccine. One dose is recommended after age 31. Talk to your health care provider about which screenings and vaccines you need and how often you need them. This information is not intended to replace advice given to you by your health care provider. Make sure you discuss any questions you have with your health care provider. Document Released: 09/06/2015 Document Revised: 04/29/2016 Document Reviewed: 06/11/2015 Elsevier Interactive Patient Education  2017 [place] Prevention in the Home Falls can cause injuries. They can happen to people of all ages. There are many things you can do to make your home safe and to help prevent falls. What can I do on the outside of my home? Regularly fix the edges of walkways and driveways and fix any cracks. Remove anything that might make you trip as you walk through a door, such as a raised step or threshold. Trim any bushes or trees on the path to your home. Use bright outdoor lighting. Clear any walking paths of anything that might make someone trip, such as rocks or tools. Regularly check to see if handrails are loose or broken. Make sure that both sides of any steps have handrails. Any raised decks and porches should have guardrails on the edges. Have any leaves, snow, or ice cleared regularly. Use sand or salt on walking paths during winter. Clean up any spills in your garage right away. This includes oil or grease spills. What can I do in the bathroom? Use night lights. Install grab bars by the toilet and in the tub and shower. Do not use towel bars as grab bars. Use non-skid mats or decals in the tub or shower. If you need to sit down in the shower, use a plastic, non-slip stool. Keep the floor dry. Clean up any water that spills on the floor as soon as it happens. Remove soap buildup in the tub or shower regularly. Attach  bath mats securely with double-sided non-slip rug tape. Do not have throw rugs and other things on the floor that can make you trip. What can I do in the bedroom? Use night lights. Make sure that you have a light by your bed that is easy to reach. Do not use any sheets or blankets that are too big for your bed. They should not hang down onto the floor. Have a firm chair that has side arms. You can use this for support while you get dressed. Do not have throw rugs and other things on the floor that can make you trip. What can I do in the kitchen? Clean up any spills right away. Avoid walking on wet floors. Keep items that you use a lot in easy-to-reach places. If you need to reach something above you, use a strong step stool that has a grab bar. Keep electrical cords out of the way. Do not use floor polish or wax that makes floors slippery. If you must use wax, use non-skid floor wax. Do not have throw rugs and other things on the floor that can make you trip. What can I do with my stairs? Do not leave any items on the stairs. Make sure that there are handrails on both sides of the stairs and use them. Fix handrails that are broken or loose. Make sure that handrails are as long as the stairways. Check any carpeting to make sure that it is firmly attached  to the stairs. Fix any carpet that is loose or worn. Avoid having throw rugs at the top or bottom of the stairs. If you do have throw rugs, attach them to the floor with carpet tape. Make sure that you have a light switch at the top of the stairs and the bottom of the stairs. If you do not have them, ask someone to add them for you. What else can I do to help prevent falls? Wear shoes that: Do not have high heels. Have rubber bottoms. Are comfortable and fit you well. Are closed at the toe. Do not wear sandals. If you use a stepladder: Make sure that it is fully opened. Do not climb a closed stepladder. Make sure that both sides of the  stepladder are locked into place. Ask someone to hold it for you, if possible. Clearly mark and make sure that you can see: Any grab bars or handrails. First and last steps. Where the edge of each step is. Use tools that help you move around (mobility aids) if they are needed. These include: Canes. Walkers. Scooters. Crutches. Turn on the lights when you go into a dark area. Replace any light bulbs as soon as they burn out. Set up your furniture so you have a clear path. Avoid moving your furniture around. If any of your floors are uneven, fix them. If there are any pets around you, be aware of where they are. Review your medicines with your doctor. Some medicines can make you feel dizzy. This can increase your chance of falling. Ask your doctor what other things that you can do to help prevent falls. This information is not intended to replace advice given to you by your health care provider. Make sure you discuss any questions you have with your health care provider. Document Released: 06/06/2009 Document Revised: 01/16/2016 Document Reviewed: 09/14/2014 Elsevier Interactive Patient Education  2017 [employer].

## 2022-02-19 ENCOUNTER — Other Ambulatory Visit (HOSPITAL_COMMUNITY): Payer: Self-pay | Admitting: Psychiatry

## 2022-03-02 ENCOUNTER — Telehealth (HOSPITAL_COMMUNITY): Payer: Self-pay

## 2022-03-02 DIAGNOSIS — F325 Major depressive disorder, single episode, in full remission: Secondary | ICD-10-CM

## 2022-03-02 MED ORDER — VENLAFAXINE HCL ER 37.5 MG PO CP24: 37.5000 mg | ORAL_CAPSULE | Freq: Every day | ORAL | 3 refills | Status: DC

## 2022-03-02 NOTE — Telephone Encounter (Signed)
Medication refill request - Message received from pt's [facility] requesting a new Venlafaxine XR 37.5 mg order, one a day.  Contacted Dr. Casimiro Needle by phone who authorized a new order be sent for one a day with 3 refills. Pt returns next on 06/03/22.  A new Venlafaxine XR 37.5 mg, one a day, #30 with 3 refills e-scribed to patient's [facility] as verbally authorized by Dr. Casimiro Needle this date.

## 2022-04-14 ENCOUNTER — Other Ambulatory Visit (HOSPITAL_COMMUNITY): Payer: Self-pay | Admitting: Psychiatry

## 2022-05-12 ENCOUNTER — Encounter: Payer: Self-pay | Admitting: Internal Medicine

## 2022-05-12 ENCOUNTER — Ambulatory Visit (INDEPENDENT_AMBULATORY_CARE_PROVIDER_SITE_OTHER): Payer: Medicare Other | Admitting: Internal Medicine

## 2022-05-12 VITALS — BP 126/64 | HR 75 | Temp 97.5°F | Resp 16 | Ht 67.0 in | Wt 185.0 lb

## 2022-05-12 DIAGNOSIS — Z23 Encounter for immunization: Secondary | ICD-10-CM

## 2022-05-12 DIAGNOSIS — D539 Nutritional anemia, unspecified: Secondary | ICD-10-CM

## 2022-05-12 DIAGNOSIS — G319 Degenerative disease of nervous system, unspecified: Secondary | ICD-10-CM | POA: Diagnosis not present

## 2022-05-12 DIAGNOSIS — N1832 Chronic kidney disease, stage 3b: Secondary | ICD-10-CM

## 2022-05-12 DIAGNOSIS — E039 Hypothyroidism, unspecified: Secondary | ICD-10-CM | POA: Diagnosis not present

## 2022-05-12 LAB — CBC WITH DIFFERENTIAL/PLATELET
Basophils Absolute: 0.1 10*3/uL (ref 0.0–0.1)
Basophils Relative: 0.9 % (ref 0.0–3.0)
Eosinophils Absolute: 0.1 10*3/uL (ref 0.0–0.7)
Eosinophils Relative: 2.3 % (ref 0.0–5.0)
HCT: 30.1 % — ABNORMAL LOW (ref 39.0–52.0)
Hemoglobin: 10.2 g/dL — ABNORMAL LOW (ref 13.0–17.0)
Lymphocytes Relative: 25.5 % (ref 12.0–46.0)
Lymphs Abs: 1.5 10*3/uL (ref 0.7–4.0)
MCHC: 33.9 g/dL (ref 30.0–36.0)
MCV: 90.5 fl (ref 78.0–100.0)
Monocytes Absolute: 0.5 10*3/uL (ref 0.1–1.0)
Monocytes Relative: 9.2 % (ref 3.0–12.0)
Neutro Abs: 3.6 10*3/uL (ref 1.4–7.7)
Neutrophils Relative %: 62.1 % (ref 43.0–77.0)
Platelets: 187 10*3/uL (ref 150.0–400.0)
RBC: 3.32 Mil/uL — ABNORMAL LOW (ref 4.22–5.81)
RDW: 13.7 % (ref 11.5–15.5)
WBC: 5.9 10*3/uL (ref 4.0–10.5)

## 2022-05-12 LAB — BASIC METABOLIC PANEL
BUN: 30 mg/dL — ABNORMAL HIGH (ref 6–23)
CO2: 28 mEq/L (ref 19–32)
Calcium: 8.8 mg/dL (ref 8.4–10.5)
Chloride: 105 mEq/L (ref 96–112)
Creatinine, Ser: 1.22 mg/dL (ref 0.40–1.50)
GFR: 53.39 mL/min — ABNORMAL LOW (ref >60.00)
Glucose, Bld: 95 mg/dL (ref 70–99)
Potassium: 4.3 mEq/L (ref 3.5–5.1)
Sodium: 139 mEq/L (ref 135–145)

## 2022-05-12 LAB — TSH: TSH: 2.69 u[IU]/mL (ref 0.35–5.50)

## 2022-05-12 NOTE — Patient Instructions (Signed)

## 2022-05-12 NOTE — Progress Notes (Unsigned)
Subjective:  Patient ID: Benjamin Hahn, male    DOB: [birth year]  Age: 88 y.o. MRN: [id number]  CC: Anemia and Hypothyroidism   HPI [name] presents for f/up -  He complains of lapses in his memory but denies chest pain, shortness of breath, palpitations, or edema.  Outpatient Medications Prior to Visit  Medication Sig Dispense Refill   acetaminophen (TYLENOL) 325 MG tablet Take 325-650 mg by mouth every 6 (six) hours as needed for mild pain or headache.     ALPRAZolam (XANAX) 0.5 MG tablet Take 0.5 tablets (0.25 mg total) by mouth at bedtime as needed. 25 tablet 3   buPROPion ER (WELLBUTRIN SR) 100 MG 12 hr tablet Take two tablets (200 mg total) by mouth each morning. 60 tablet 4   hydrOXYzine (ATARAX/VISTARIL) 25 MG tablet Take 1 tablet (25 mg total) by mouth at bedtime. (Patient taking differently: Take 25 mg by mouth at bedtime as needed.) 30 tablet 1   levothyroxine (SYNTHROID) 88 MCG tablet Take 1 tablet (88 mcg total) by mouth daily. 90 tablet 1   LORazepam (ATIVAN) 0.5 MG tablet Take 1 tablet (0.5 mg total) by mouth at bedtime as needed. 30 tablet 3   tamsulosin (FLOMAX) 0.4 MG CAPS capsule Take 0.8 mg by mouth daily.     ramipril (ALTACE) 1.25 MG capsule Take 1.25 mg by mouth daily after supper.     temazepam (RESTORIL) 15 MG capsule Take 1 capsule (15 mg total) by mouth at bedtime as needed for sleep. (Patient not taking: Reported on 05/12/2022) 30 capsule 3   venlafaxine XR (EFFEXOR XR) 37.5 MG 24 hr capsule Take 1 capsule (37.5 mg total) by mouth daily. 30 capsule 3   celecoxib (CELEBREX) 100 MG capsule Take 100 mg by mouth 2 (two) times daily. (Patient not taking: Reported on 05/12/2022)     lubiprostone (AMITIZA) 24 MCG capsule Take 1 capsule (24 mcg total) by mouth 2 (two) times daily with a meal. (Patient not taking: Reported on 05/12/2022) 180 capsule 1   No facility-administered medications prior to visit.    ROS Review of Systems  Constitutional:  Negative.  Negative for diaphoresis and fatigue.  HENT: Negative.    Eyes: Negative.   Respiratory:  Negative for cough, chest tightness, shortness of breath and wheezing.   Cardiovascular:  Negative for chest pain, palpitations and leg swelling.  Gastrointestinal:  Negative for abdominal pain, constipation, diarrhea, nausea and vomiting.  Endocrine: Negative.   Genitourinary: Negative.  Negative for difficulty urinating.  Musculoskeletal:  Positive for gait problem.  Skin: Negative.   Neurological:  Positive for weakness. Negative for dizziness.  Hematological:  Negative for adenopathy. Does not bruise/bleed easily.  Psychiatric/Behavioral: Negative.      Objective:  BP 126/64 (BP Location: Right Arm, Patient Position: Sitting, Cuff Size: Normal)   Pulse 75   Temp (!) 97.5 F (36.4 C) (Oral)   Resp 16   Ht '5\' 7"'$  (1.702 m)   Wt 185 lb (83.9 kg)   SpO2 97%   BMI 28.98 kg/m   BP Readings from Last 3 Encounters:  05/12/22 126/64  08/19/21 128/70  01/23/21 102/60    Wt Readings from Last 3 Encounters:  05/12/22 185 lb (83.9 kg)  08/19/21 183 lb (83 kg)  01/23/21 183 lb 3.2 oz (83.1 kg)    Physical Exam HENT:     Mouth/Throat:     Mouth: Mucous membranes are moist.  Eyes:     General: No scleral icterus.  Conjunctiva/sclera: Conjunctivae normal.  Cardiovascular:     Rate and Rhythm: Normal rate and regular rhythm.     Heart sounds: No murmur heard. Pulmonary:     Effort: Pulmonary effort is normal.     Breath sounds: No stridor. No wheezing, rhonchi or rales.  Abdominal:     General: Abdomen is flat.     Palpations: There is no mass.     Tenderness: There is no abdominal tenderness. There is no guarding.     Hernia: No hernia is present.  Musculoskeletal:        General: Normal range of motion.     Cervical back: Neck supple.     Right lower leg: No edema.     Left lower leg: No edema.  Lymphadenopathy:     Cervical: No cervical adenopathy.  Skin:     General: Skin is warm.  Neurological:     Mental Status: He is alert. Mental status is at baseline.     Cranial Nerves: No cranial nerve deficit.     Coordination: Coordination abnormal.     Gait: Gait abnormal.  Psychiatric:        Mood and Affect: Mood normal.        Behavior: Behavior normal.     Lab Results  Component Value Date   WBC 5.9 05/12/2022   HGB 10.2 (L) 05/12/2022   HCT 30.1 (L) 05/12/2022   PLT 187.0 05/12/2022   GLUCOSE 95 05/12/2022   CHOL 147 10/02/2020   TRIG 55.0 10/02/2020   HDL 79.30 10/02/2020   LDLCALC 57 10/02/2020   ALT 23 10/02/2020   AST 20 10/02/2020   NA 139 05/12/2022   K 4.3 05/12/2022   CL 105 05/12/2022   CREATININE 1.22 05/12/2022   BUN 30 (H) 05/12/2022   CO2 28 05/12/2022   TSH 2.69 05/12/2022   INR 1.1 10/11/2019   HGBA1C 5.5 04/14/2019    CT Head Wo Contrast  Result Date: 10/11/2019 CLINICAL DATA:  88 year old who fell off of a curb while at a shopping center earlier today with loss of consciousness and laceration to the LEFT eyebrow. Patient is amnestic to the event. Initial encounter. Personal history of subdural hematoma. EXAM: CT HEAD WITHOUT CONTRAST CT CERVICAL SPINE WITHOUT CONTRAST TECHNIQUE: Multidetector CT imaging of the head and cervical spine was performed following the standard protocol without intravenous contrast. Multiplanar CT image reconstructions of the cervical spine were also generated. COMPARISON:  05/31/2019 and earlier. FINDINGS: CT HEAD FINDINGS Brain: Moderate cortical, deep and cerebellar atrophy as noted previously. No mass lesion. No midline shift. No acute hemorrhage or hematoma. No extra-axial fluid collections. No evidence of acute infarction. Dural thickening beneath the RIGHT frontal craniotomy flap. Vascular: Moderate to severe BILATERAL carotid siphon and mild BILATERAL vertebral artery atherosclerosis. No hyperdense vessel. Skull: Prior RIGHT frontal craniotomy. No skull fracture or other focal osseous  abnormality involving the skull. Sinuses/Orbits: Preseptal soft tissue swelling/hematoma anterior to the LEFT orbit. No evidence of intraorbital hemorrhage. Other: None. CT CERVICAL SPINE FINDINGS Alignment: Anatomic posterior alignment. Straightening of the usual cervical lordosis. Facet joints anatomically aligned throughout with severe diffuse degenerative changes. Skull base and vertebrae: No fractures identified involving the cervical spine. Coronal reformatted images demonstrate an intact craniocervical junction, intact dens and intact lateral masses throughout. Degenerative changes at the C1-C2 articulation with calcified pannus POSTERIOR to the dens. Soft tissues and spinal canal: No evidence of paraspinous or spinal canal hematoma. No evidence of spinal stenosis. Disc levels: Severe  disc space narrowing and associated endplate hypertrophic changes at C6-7. Moderate disc space narrowing at C5-6 and C7-T1. Calcification within the C2-3 disc. Combination of facet and uncinate hypertrophy account for multilevel foraminal stenoses including severe BILATERAL C3-4, moderate BILATERAL C4-5, severe BILATERAL C5-6, severe BILATERAL C6-7. Upper chest: Visualized lung apices clear. Mild atherosclerosis involving the visualized proximal great vessels. Other: DISH involving the visualized UPPER thoracic spine. BILATERAL cervical carotid atherosclerosis. IMPRESSION: 1. No acute intracranial abnormality. 2. Moderate generalized atrophy. 3. No cervical spine fractures identified. 4. Multilevel degenerative disc disease, spondylosis and foraminal stenoses throughout the cervical spine as detailed above. 5. Preseptal soft tissue swelling/hematoma anterior to the LEFT orbit without evidence of intraorbital hemorrhage. Electronically Signed   By: Evangeline Dakin M.D.   On: 10/11/2019 18:10   CT Cervical Spine Wo Contrast  Result Date: 10/11/2019 CLINICAL DATA:  88 year old who fell off of a curb while at a shopping center  earlier today with loss of consciousness and laceration to the LEFT eyebrow. Patient is amnestic to the event. Initial encounter. Personal history of subdural hematoma. EXAM: CT HEAD WITHOUT CONTRAST CT CERVICAL SPINE WITHOUT CONTRAST TECHNIQUE: Multidetector CT imaging of the head and cervical spine was performed following the standard protocol without intravenous contrast. Multiplanar CT image reconstructions of the cervical spine were also generated. COMPARISON:  05/31/2019 and earlier. FINDINGS: CT HEAD FINDINGS Brain: Moderate cortical, deep and cerebellar atrophy as noted previously. No mass lesion. No midline shift. No acute hemorrhage or hematoma. No extra-axial fluid collections. No evidence of acute infarction. Dural thickening beneath the RIGHT frontal craniotomy flap. Vascular: Moderate to severe BILATERAL carotid siphon and mild BILATERAL vertebral artery atherosclerosis. No hyperdense vessel. Skull: Prior RIGHT frontal craniotomy. No skull fracture or other focal osseous abnormality involving the skull. Sinuses/Orbits: Preseptal soft tissue swelling/hematoma anterior to the LEFT orbit. No evidence of intraorbital hemorrhage. Other: None. CT CERVICAL SPINE FINDINGS Alignment: Anatomic posterior alignment. Straightening of the usual cervical lordosis. Facet joints anatomically aligned throughout with severe diffuse degenerative changes. Skull base and vertebrae: No fractures identified involving the cervical spine. Coronal reformatted images demonstrate an intact craniocervical junction, intact dens and intact lateral masses throughout. Degenerative changes at the C1-C2 articulation with calcified pannus POSTERIOR to the dens. Soft tissues and spinal canal: No evidence of paraspinous or spinal canal hematoma. No evidence of spinal stenosis. Disc levels: Severe disc space narrowing and associated endplate hypertrophic changes at C6-7. Moderate disc space narrowing at C5-6 and C7-T1. Calcification within  the C2-3 disc. Combination of facet and uncinate hypertrophy account for multilevel foraminal stenoses including severe BILATERAL C3-4, moderate BILATERAL C4-5, severe BILATERAL C5-6, severe BILATERAL C6-7. Upper chest: Visualized lung apices clear. Mild atherosclerosis involving the visualized proximal great vessels. Other: DISH involving the visualized UPPER thoracic spine. BILATERAL cervical carotid atherosclerosis. IMPRESSION: 1. No acute intracranial abnormality. 2. Moderate generalized atrophy. 3. No cervical spine fractures identified. 4. Multilevel degenerative disc disease, spondylosis and foraminal stenoses throughout the cervical spine as detailed above. 5. Preseptal soft tissue swelling/hematoma anterior to the LEFT orbit without evidence of intraorbital hemorrhage. Electronically Signed   By: Evangeline Dakin M.D.   On: 10/11/2019 18:10    Assessment & Plan:   Clint was seen today for anemia and hypothyroidism.  Diagnoses and all orders for this visit:  Flu vaccine need -     Flu Vaccine QUAD High Dose(Fluad)  Stage 3b chronic kidney disease ([place])- His renal function is stable. -     Basic metabolic panel; Future -  Basic metabolic panel  Deficiency anemia- His H&H are stable. -     CBC with Differential/Platelet; Future -     CBC with Differential/Platelet  Acquired hypothyroidism- He is euthyroid.  Will continue the current T4 dosage. -     TSH; Future -     TSH  Brain atrophy ([place])- No changes noted.   I have discontinued Cristopher Estimable. Crandell's ramipril, celecoxib, and lubiprostone. I am also having him maintain his acetaminophen, hydrOXYzine, tamsulosin, ALPRAZolam, buPROPion ER, LORazepam, temazepam, levothyroxine, and venlafaxine XR.  No orders of the defined types were placed in this encounter.    Follow-up: Return in about 6 months (around 11/10/2022).  Scarlette Calico, MD

## 2022-05-13 DIAGNOSIS — G319 Degenerative disease of nervous system, unspecified: Secondary | ICD-10-CM | POA: Insufficient documentation

## 2022-05-19 DIAGNOSIS — Z23 Encounter for immunization: Secondary | ICD-10-CM | POA: Diagnosis not present

## 2022-06-03 ENCOUNTER — Telehealth (HOSPITAL_BASED_OUTPATIENT_CLINIC_OR_DEPARTMENT_OTHER): Payer: Medicare Other | Admitting: Psychiatry

## 2022-06-03 DIAGNOSIS — F3342 Major depressive disorder, recurrent, in full remission: Secondary | ICD-10-CM

## 2022-06-03 DIAGNOSIS — F325 Major depressive disorder, single episode, in full remission: Secondary | ICD-10-CM

## 2022-06-03 MED ORDER — TEMAZEPAM 15 MG PO CAPS: 15.0000 mg | ORAL_CAPSULE | Freq: Every evening | ORAL | 5 refills | Status: DC | PRN

## 2022-06-03 MED ORDER — ALPRAZOLAM 0.5 MG PO TABS: 0.2500 mg | ORAL_TABLET | Freq: Every evening | ORAL | 5 refills | Status: DC | PRN

## 2022-06-03 MED ORDER — BUPROPION HCL ER (SR) 100 MG PO TB12: ORAL_TABLET | ORAL | 4 refills | Status: DC

## 2022-06-03 MED ORDER — VENLAFAXINE HCL ER 37.5 MG PO CP24: 37.5000 mg | ORAL_CAPSULE | Freq: Every day | ORAL | 5 refills | Status: DC

## 2022-06-03 MED ORDER — LORAZEPAM 0.5 MG PO TABS: 0.5000 mg | ORAL_TABLET | Freq: Every evening | ORAL | 5 refills | Status: DC | PRN

## 2022-06-03 NOTE — Progress Notes (Signed)
Patient ID: Benjamin Hahn, male   DOB: [birth year], 88 y.o.   MRN: [id number] [facility] MD/PA/NP OP Progress Note  06/03/2022 2:36 PM Benjamin Hahn  MRN:  [id number]  Chief Comp  Today the patient is doing well.  He is at his baseline.  He is working today.  He works about 3 hours every day.  His wife takes him to work.  The patient is on a walker.  He did have a fall a few months ago but he did okay.  He takes his medicines just as prescribed.  Amazingly his medical status is pretty good.  The only other medicine he takes is a thyroid supplement.  His wife is doing well as well.  His wife is doing well as well.  The patient denies depression.  His 2 daughters are doing well and 4 grandchildren who are doing well.  The patient is very compliant with his medication.  He takes 1 Effexor a day.  He takes Wellbutrin 100 mg slow release apparently just takes 1 a day.  At night he either takes Restoril or Ativan but never both.  If he wakes he has a small dose of Xanax to take on a as needed basis.  He is eating well.  He has got good energy.  His thoughts are clear and order.  He is functioning actually quite well.  He has no emotional complaints.  Past Medical History:  Diagnosis Date   Anxiety    Arthritis    might be in back, no problems   BPH (benign prostatic hyperplasia)    Depression 1990s   GERD (gastroesophageal reflux disease)    occasional   Hypercholesteremia    controled   Hypothyroid    MI (myocardial infarction) [facility])    Shingles May 2014   "Right face, still has some"   Temporal arteritis ([place])    Transient blindness of both eyes     Past Surgical History:  Procedure Laterality Date   ARTERY BIOPSY Right 06/20/2013   Procedure: BIOPSY TEMPORAL ARTERY RIGHT;  Surgeon: Earnstine Regal, MD;  [facility];  Service: General;  Laterality: Right;   CARDIAC CATHETERIZATION N/A 09/02/2015   Procedure: Left Heart Cath and Coronary Angiography;  Surgeon: Charolette Forward, MD;   Location: [place] CV LAB;  Service: Cardiovascular;  Laterality: N/A;   CARDIAC CATHETERIZATION N/A 09/02/2015   Procedure: Coronary Stent Intervention;  Surgeon: Charolette Forward, MD;  Location: [place] CV LAB;  Service: Cardiovascular;  Laterality: N/A;  1.  mid RCA      (3.0/28mm Xience) 2.  Mid LAD      (3.0/23mm Xience)   CRANIOTOMY N/A 04/19/2019   Procedure: CRANIOTOMY HEMATOMA EVACUATION SUBDURAL;  Surgeon: Jovita Gamma, MD;  Location: [place];  Service: Neurosurgery;  Laterality: N/A;  CRANIOTOMY HEMATOMA EVACUATION SUBDURAL   None      Family Psychiatric History:   Family History:  Family History  Problem Relation Age of Onset   Stroke Father        Died, 48s   Stroke Sister        Living, 71   Heart disease Mother        Died, 41   Healthy Daughter    Diabetes Maternal Grandmother    Hypertension Neg Hx     Social History:  Social History   Socioeconomic History   Marital status: Married    Spouse name: Not on file   Number of children: Not on file  Years of education: Not on file   Highest education level: Not on file  Occupational History   Not on file  Tobacco Use   Smoking status: Former    Types: Cigarettes    Quit date: 08/24/1966    Years since quitting: 55.8    Passive exposure: Never   Smokeless tobacco: Never  Substance and Sexual Activity   Alcohol use: Not Currently    Comment: socially   Drug use: Never   Sexual activity: Not Currently    Partners: Female  Other Topics Concern   Not on file  Social History Narrative   ** Merged History Encounter **       Currently works as a Midwife.  Lives with wife and they have two healthy daughters.   Social Determinants of Health   Financial Resource Strain: Low Risk  (01/29/2022)   Overall Financial Resource Strain (CARDIA)    Difficulty of Paying Living Expenses: Not hard at all  Food Insecurity: No Food Insecurity (01/29/2022)   Hunger Vital Sign    Worried About Running Out of Food  in the Last Year: Never true    Ran Out of Food in the Last Year: Never true  Transportation Needs: Unknown (01/29/2022)   PRAPARE - Hydrologist (Medical): No    Lack of Transportation (Non-Medical): Not on file  Physical Activity: Inactive (01/29/2022)   Exercise Vital Sign    Days of Exercise per Week: 0 days    Minutes of Exercise per Session: 0 min  Stress: No Stress Concern Present (01/29/2022)   [place] of Stress : Not at all  Social Connections: [place] (01/29/2022)   Social Connection and Isolation Panel [NHANES]    Frequency of Communication with Friends and Family: More than three times a week    Frequency of Social Gatherings with Friends and Family: More than three times a week    Attends Religious Services: More than 4 times per year    Active Member of Clubs or Organizations: Yes    Attends Music therapist: More than 4 times per year    Marital Status: Married    Allergies:  No Known Allergies   Metabolic Disorder Labs: Lab Results  Component Value Date   HGBA1C 5.5 04/14/2019   No results found for: "PROLACTIN" Lab Results  Component Value Date   CHOL 147 10/02/2020   TRIG 55.0 10/02/2020   HDL 79.30 10/02/2020   CHOLHDL 2 10/02/2020   VLDL 11.0 10/02/2020   LDLCALC 57 10/02/2020   LDLCALC 51 06/01/2019     Current Medications: Current Outpatient Medications  Medication Sig Dispense Refill   acetaminophen (TYLENOL) 325 MG tablet Take 325-650 mg by mouth every 6 (six) hours as needed for mild pain or headache.     ALPRAZolam (XANAX) 0.5 MG tablet Take 0.5 tablets (0.25 mg total) by mouth at bedtime as needed. 25 tablet 5   buPROPion ER (WELLBUTRIN SR) 100 MG 12 hr tablet Take two tablets (200 mg total) by mouth each morning. 60 tablet 4   hydrOXYzine (ATARAX/VISTARIL) 25 MG tablet Take 1 tablet (25 mg total) by mouth at bedtime.  (Patient taking differently: Take 25 mg by mouth at bedtime as needed.) 30 tablet 1   levothyroxine (SYNTHROID) 88 MCG tablet Take 1 tablet (88 mcg total) by mouth daily. 90 tablet 1   LORazepam (ATIVAN) 0.5 MG tablet Take  1 tablet (0.5 mg total) by mouth at bedtime as needed. 30 tablet 5   tamsulosin (FLOMAX) 0.4 MG CAPS capsule Take 0.8 mg by mouth daily.     temazepam (RESTORIL) 15 MG capsule Take 1 capsule (15 mg total) by mouth at bedtime as needed for sleep. 30 capsule 5   venlafaxine XR (EFFEXOR XR) 37.5 MG 24 hr capsule Take 1 capsule (37.5 mg total) by mouth daily. 30 capsule 5   No current facility-administered medications for this visit.    Neurologic: Headache: No Seizure: No Paresthesias: No  Musculoskeletal: Strength & Muscle Tone: within normal limits Gait & Station: normal Patient leans: NA  Psychiatric Specialty Exam: ROS  There were no vitals taken for this visit.There is no height or weight on file to calculate BMI.  General Appearance: Fairly Groomed  Eye Contact:  Good  Speech:  Clear and Coherent  Volume:  Normal  Mood:  Euthymic  Affect:  Appropriate  Thought Process:  Coherent  Orientation:  Full (Time, Place, and Person)  Thought Content:  WDL  Suicidal Thoughts:  No  Homicidal Thoughts:  No  Memory:  NA  Judgement:  Good  Insight:  Good  Psychomotor Activity:  Normal  Concentration:  Good  Recall:  Good  Fund of Knowledge: Good  Language: Good  Akathisia:  No  Handed:  Right  AIMS (if indicated):    Assets:  Desire for Improvement  ADL's:  Intact  Cognition: WNL  Sleep:      Treatment/plan   Today patient is doing very well.  His first problem is that of major clinical depression.  He takes Effexor and Wellbutrin.  His second problem is insomnia.  He switches off between Restoril and Ativan and will take a as needed Xanax when needed.  He is actually doing very well.  He is functioning well. Virtual Visit via Telephone Note  I  connected with Benjamin Hahn on 06/03/22 at  2:00 PM EDT by telephone and verified that I am speaking with the correct person using two identifiers.  Location: Patient: Home Provider: Office   I discussed the limitations, risks, security and privacy concerns of performing an evaluation and management service by telephone and the availability of in person appointments. I also discussed with the patient that there may be a patient responsible charge related to this service. The patient expressed understanding and agreed to proceed.   I discussed the assessment and treatment plan with the patient. The patient was provided an opportunity to ask questions and all were answered. The patient agreed with the plan and demonstrated an understanding of the instructions.   The patient was advised to call back or seek an in-person evaluation if the symptoms worsen or if the condition fails to improve as anticipated.  I provided 30 minutes of non-face-to-face time during this encounter.   Jerral Ralph, MD  Virtual Visit via Telephone Note  I connected with Benjamin Hahn on 06/03/22 at  2:00 PM EDT by telephone and verified that I am speaking with the correct person using two identifiers.  Location: Patient: home Provider: office   I discussed the limitations, risks, security and privacy concerns of performing an evaluation and management service by telephone and the availability of in person appointments. I also discussed with the patient that there may be a patient responsible charge related to this service. The patient expressed understanding and agreed to proceed.   History of Present Illness:    Observations/Objective:  Assessment and Plan:   Follow Up Instructions:    I discussed the assessment and treatment plan with the patient. The patient was provided an opportunity to ask questions and all were answered. The patient agreed with the plan and demonstrated an  understanding of the instructions.   The patient was advised to call back or seek an in-person evaluation if the symptoms worsen or if the condition fails to improve as anticipated.  I provided 30 minutes of non-face-to-face time during this encounter.   Jerral Ralph, MD  06/03/2022, 2:36 PM [facility] MD Progress Note  06/03/2022 2:36 PM Benjamin Hahn  MRN:  [id number] Subjective:  Feels well Principal Problem: Major depression, chronic, mild/residual Diagnosis:  Major depression, recurrent residual Today the patient is doing fairly well. He's been having some medical problems. He recently went for Thanksgiving to a length but unfortunately had a urinary tract infection. He also since then has been coughing and recently started on antibiotic. Emotionally he is fairly stable. He denies daily depression. He stays very active at work. He's been unable to exercise. The patient is sleeping well and has an increase in his appetite. His energy is good. Is no problems thinking or concentrating. He is recently having a productive cough but is no shortness of breath. He denies any chest pain. Is a stable relationship with his wife. His kidneys are good. He has 4 grandchildren. Everybody in his home is good. He only issue is that a number mornings he awakens and he feels oversedated. It occurs point of his wife feels uncomfortable with him driving will taken to work. Today reviewed his medications and is evident that he likely needs reduction. He also notes that his memory has change in his and is good. 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 Patient Active Problem List   Diagnosis Date Noted   Brain atrophy ([place]) [G31.9] 05/13/2022   Stage 3b chronic kidney disease ([place]) [N18.32] 08/19/2021   Psychophysiological insomnia [F51.04] 12/07/2019   Deficiency anemia [D53.9] 06/01/2019   Vitamin D deficiency [E55.9] 06/01/2019   Chronic idiopathic  constipation [K59.04] 06/01/2019   Mild intermittent asthma with acute exacerbation [J45.21] 02/08/2018   Hypothyroidism [E03.9] 03/14/2013   Hyperlipidemia LDL goal <70 [E78.5] 03/14/2013   Benign prostatic hyperplasia [N40.0] 03/14/2013   Hypogonadism, male [E29.1] 03/14/2013   Erectile dysfunction [N52.9] 03/14/2013   Total Time spent with patient: 30 minutes  Past Psychiatric History:   Past Medical History:  Past Medical History:  Diagnosis Date   Anxiety    Arthritis    might be in back, no problems   BPH (benign prostatic hyperplasia)    Depression 1990s   GERD (gastroesophageal reflux disease)    occasional   Hypercholesteremia    controled   Hypothyroid    MI (myocardial infarction) [facility])    Shingles May 2014   "Right face, still has some"   Temporal arteritis ([place])    Transient blindness of both eyes     Past Surgical History:  Procedure Laterality Date   ARTERY BIOPSY Right 06/20/2013   Procedure: BIOPSY TEMPORAL ARTERY RIGHT;  Surgeon: Earnstine Regal, MD;  [facility];  Service: General;  Laterality: Right;   CARDIAC CATHETERIZATION N/A 09/02/2015   Procedure: Left Heart Cath and Coronary Angiography;  Surgeon: Charolette Forward, MD;  Location: [place] CV LAB;  Service: Cardiovascular;  Laterality: N/A;   CARDIAC CATHETERIZATION N/A 09/02/2015   Procedure:  Coronary Stent Intervention;  Surgeon: Charolette Forward, MD;  Location: [place] CV LAB;  Service: Cardiovascular;  Laterality: N/A;  1.  mid RCA      (3.0/28mm Xience) 2.  Mid LAD      (3.0/23mm Xience)   CRANIOTOMY N/A 04/19/2019   Procedure: CRANIOTOMY HEMATOMA EVACUATION SUBDURAL;  Surgeon: Jovita Gamma, MD;  Location: [place];  Service: Neurosurgery;  Laterality: N/A;  CRANIOTOMY HEMATOMA EVACUATION SUBDURAL   None     Family History:  Family History  Problem Relation Age of Onset   Stroke Father        Died, 41s   Stroke Sister        Living, 59   Heart disease Mother        Died, 57    Healthy Daughter    Diabetes Maternal Grandmother    Hypertension Neg Hx    Family Psychiatric  History:  Social History:  Social History   Substance and Sexual Activity  Alcohol Use Not Currently   Comment: socially     Social History   Substance and Sexual Activity  Drug Use Never    Social History   Socioeconomic History   Marital status: Married    Spouse name: Not on file   Number of children: Not on file   Years of education: Not on file   Highest education level: Not on file  Occupational History   Not on file  Tobacco Use   Smoking status: Former    Types: Cigarettes    Quit date: 08/24/1966    Years since quitting: 55.8    Passive exposure: Never   Smokeless tobacco: Never  Substance and Sexual Activity   Alcohol use: Not Currently    Comment: socially   Drug use: Never   Sexual activity: Not Currently    Partners: Female  Other Topics Concern   Not on file  Social History Narrative   ** Merged History Encounter **       Currently works as a Midwife.  Lives with wife and they have two healthy daughters.   Social Determinants of Health   Financial Resource Strain: Low Risk  (01/29/2022)   Overall Financial Resource Strain (CARDIA)    Difficulty of Paying Living Expenses: Not hard at all  Food Insecurity: No Food Insecurity (01/29/2022)   Hunger Vital Sign    Worried About Running Out of Food in the Last Year: Never true    Ran Out of Food in the Last Year: Never true  Transportation Needs: Unknown (01/29/2022)   PRAPARE - Hydrologist (Medical): No    Lack of Transportation (Non-Medical): Not on file  Physical Activity: Inactive (01/29/2022)   Exercise Vital Sign    Days of Exercise per Week: 0 days    Minutes of Exercise per Session: 0 min  Stress: No Stress Concern Present (01/29/2022)   [place]    Feeling of Stress : Not at all  Social Connections:  [place] (01/29/2022)   Social Connection and Isolation Panel [NHANES]    Frequency of Communication with Friends and Family: More than three times a week    Frequency of Social Gatherings with Friends and Family: More than three times a week    Attends Religious Services: More than 4 times per year    Active Member of [employer] or Organizations: Yes    Attends Archivist Meetings: More than 4  times per year    Marital Status: Married   Additional Social History:                         Sleep: Good  Appetite:  Good  Current Medications: Current Outpatient Medications  Medication Sig Dispense Refill   acetaminophen (TYLENOL) 325 MG tablet Take 325-650 mg by mouth every 6 (six) hours as needed for mild pain or headache.     ALPRAZolam (XANAX) 0.5 MG tablet Take 0.5 tablets (0.25 mg total) by mouth at bedtime as needed. 25 tablet 5   buPROPion ER (WELLBUTRIN SR) 100 MG 12 hr tablet Take two tablets (200 mg total) by mouth each morning. 60 tablet 4   hydrOXYzine (ATARAX/VISTARIL) 25 MG tablet Take 1 tablet (25 mg total) by mouth at bedtime. (Patient taking differently: Take 25 mg by mouth at bedtime as needed.) 30 tablet 1   levothyroxine (SYNTHROID) 88 MCG tablet Take 1 tablet (88 mcg total) by mouth daily. 90 tablet 1   LORazepam (ATIVAN) 0.5 MG tablet Take 1 tablet (0.5 mg total) by mouth at bedtime as needed. 30 tablet 5   tamsulosin (FLOMAX) 0.4 MG CAPS capsule Take 0.8 mg by mouth daily.     temazepam (RESTORIL) 15 MG capsule Take 1 capsule (15 mg total) by mouth at bedtime as needed for sleep. 30 capsule 5   venlafaxine XR (EFFEXOR XR) 37.5 MG 24 hr capsule Take 1 capsule (37.5 mg total) by mouth daily. 30 capsule 5   No current facility-administered medications for this visit.    Lab Results:  No results found for this or any previous visit (from the past 48 hour(s)).  Physical Findings: AIMS:  , ,  ,  ,    CIWA:    COWS:      Musculoskeletal: Strength & Muscle Tone: within normal limits Gait & Station: normal Patient leans: Right  Psychiatric Specialty Exam: ROS  There were no vitals taken for this visit.There is no height or weight on file to calculate BMI.  General Appearance: Negative  Eye Contact::  Good  Speech:  Clear and Coherent  Volume:  Normal  Mood:  NA  Affect:  Appropriate  Thought Process:  Coherent  Orientation:  Full (Time, Place, and Person)  Thought Content:  WDL  Suicidal Thoughts:  No  Homicidal Thoughts:  No  Memory:  NA  Judgement:  Good  Insight:  Good  Psychomotor Activity:  Normal  Concentration:  Good  Recall:  Good  Fund of Knowledge:Good  Language: Good  Akathisia:  No  Handed:  Right  AIMS (if indicated):     Assets:  Desire for Improvement  ADL's:  Intact  Cognition: WNL  Sleep:      [facility] MD Progress Note  06/03/2022 2:36 PM Benjamin Hahn  MRN:  [id number] Subjective:  Feels well Principal Problem: Major depression, chronic, mild/residual Diagnosis:  Major depression, recurrent residual Today the patient is doing fairly well. He's been having some medical problems. He recently went for Thanksgiving to a length but unfortunately had a urinary tract infection. He also since then has been coughing and recently started on antibiotic. Emotionally he is fairly stable. He denies daily depression. He stays very active at work. He's been unable to exercise. The patient is sleeping well and has an increase in his appetite. His energy is good. Is no problems thinking or concentrating. He is recently having a productive cough but is  no shortness of breath. He denies any chest pain. Is a stable relationship with his wife. His kidneys are good. He has 4 grandchildren. Everybody in his home is good. He only issue is that a number mornings he awakens and he feels oversedated. It occurs point of his wife feels uncomfortable with him driving will taken to  work. Today reviewed his medications and is evident that he likely needs reduction. He also notes that his memory has change in his and is good. 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 Patient Active Problem List   Diagnosis Date Noted   Brain atrophy ([place]) [G31.9] 05/13/2022   Stage 3b chronic kidney disease ([place]) [N18.32] 08/19/2021   Psychophysiological insomnia [F51.04] 12/07/2019   Deficiency anemia [D53.9] 06/01/2019   Vitamin D deficiency [E55.9] 06/01/2019   Chronic idiopathic constipation [K59.04] 06/01/2019   Mild intermittent asthma with acute exacerbation [J45.21] 02/08/2018   Hypothyroidism [E03.9] 03/14/2013   Hyperlipidemia LDL goal <70 [E78.5] 03/14/2013   Benign prostatic hyperplasia [N40.0] 03/14/2013   Hypogonadism, male [E29.1] 03/14/2013   Erectile dysfunction [N52.9] 03/14/2013   Total Time spent with patient: 30 minutes  Past Psychiatric History:   Past Medical History:  Past Medical History:  Diagnosis Date   Anxiety    Arthritis    might be in back, no problems   BPH (benign prostatic hyperplasia)    Depression 1990s   GERD (gastroesophageal reflux disease)    occasional   Hypercholesteremia    controled   Hypothyroid    MI (myocardial infarction) [facility])    Shingles May 2014   "Right face, still has some"   Temporal arteritis ([place])    Transient blindness of both eyes     Past Surgical History:  Procedure Laterality Date   ARTERY BIOPSY Right 06/20/2013   Procedure: BIOPSY TEMPORAL ARTERY RIGHT;  Surgeon: Earnstine Regal, MD;  [facility];  Service: General;  Laterality: Right;   CARDIAC CATHETERIZATION N/A 09/02/2015   Procedure: Left Heart Cath and Coronary Angiography;  Surgeon: Charolette Forward, MD;  Location: [place] CV LAB;  Service: Cardiovascular;  Laterality: N/A;   CARDIAC CATHETERIZATION N/A 09/02/2015   Procedure: Coronary Stent Intervention;  Surgeon: Charolette Forward, MD;  Location: [place] CV LAB;  Service: Cardiovascular;  Laterality: N/A;  1.  mid RCA      (3.0/28mm Xience) 2.  Mid LAD      (3.0/23mm Xience)   CRANIOTOMY N/A 04/19/2019   Procedure: CRANIOTOMY HEMATOMA EVACUATION SUBDURAL;  Surgeon: Jovita Gamma, MD;  Location: [place];  Service: Neurosurgery;  Laterality: N/A;  CRANIOTOMY HEMATOMA EVACUATION SUBDURAL   None     Family History:  Family History  Problem Relation Age of Onset   Stroke Father        Died, 2s   Stroke Sister        Living, 82   Heart disease Mother        Died, 22   Healthy Daughter    Diabetes Maternal Grandmother    Hypertension Neg Hx    Family Psychiatric  History:  Social History:  Social History   Substance and Sexual Activity  Alcohol Use Not Currently   Comment: socially     Social History   Substance and Sexual Activity  Drug Use Never    Social History   Socioeconomic History   Marital status: Married    Spouse name: Not on file  Number of children: Not on file   Years of education: Not on file   Highest education level: Not on file  Occupational History   Not on file  Tobacco Use   Smoking status: Former    Types: Cigarettes    Quit date: 08/24/1966    Years since quitting: 55.8    Passive exposure: Never   Smokeless tobacco: Never  Substance and Sexual Activity   Alcohol use: Not Currently    Comment: socially   Drug use: Never   Sexual activity: Not Currently    Partners: Female  Other Topics Concern   Not on file  Social History Narrative   ** Merged History Encounter **       Currently works as a Midwife.  Lives with wife and they have two healthy daughters.   Social Determinants of Health   Financial Resource Strain: Low Risk  (01/29/2022)   Overall Financial Resource Strain (CARDIA)    Difficulty of Paying Living Expenses: Not hard at all  Food Insecurity: No Food Insecurity (01/29/2022)   Hunger Vital Sign    Worried About Running Out of Food in the Last Year: Never true     Ran Out of Food in the Last Year: Never true  Transportation Needs: Unknown (01/29/2022)   PRAPARE - Hydrologist (Medical): No    Lack of Transportation (Non-Medical): Not on file  Physical Activity: Inactive (01/29/2022)   Exercise Vital Sign    Days of Exercise per Week: 0 days    Minutes of Exercise per Session: 0 min  Stress: No Stress Concern Present (01/29/2022)   [place]    Feeling of Stress : Not at all  Social Connections: [place] (01/29/2022)   Social Connection and Isolation Panel [NHANES]    Frequency of Communication with Friends and Family: More than three times a week    Frequency of Social Gatherings with Friends and Family: More than three times a week    Attends Religious Services: More than 4 times per year    Active Member of [employer] or Organizations: Yes    Attends Music therapist: More than 4 times per year    Marital Status: Married   Additional Social History:                         Sleep: Good  Appetite:  Good  Current Medications: Current Outpatient Medications  Medication Sig Dispense Refill   acetaminophen (TYLENOL) 325 MG tablet Take 325-650 mg by mouth every 6 (six) hours as needed for mild pain or headache.     ALPRAZolam (XANAX) 0.5 MG tablet Take 0.5 tablets (0.25 mg total) by mouth at bedtime as needed. 25 tablet 5   buPROPion ER (WELLBUTRIN SR) 100 MG 12 hr tablet Take two tablets (200 mg total) by mouth each morning. 60 tablet 4   hydrOXYzine (ATARAX/VISTARIL) 25 MG tablet Take 1 tablet (25 mg total) by mouth at bedtime. (Patient taking differently: Take 25 mg by mouth at bedtime as needed.) 30 tablet 1   levothyroxine (SYNTHROID) 88 MCG tablet Take 1 tablet (88 mcg total) by mouth daily. 90 tablet 1   LORazepam (ATIVAN) 0.5 MG tablet Take 1 tablet (0.5 mg total) by mouth at bedtime as needed. 30 tablet 5   tamsulosin  (FLOMAX) 0.4 MG CAPS capsule Take 0.8 mg by mouth daily.  temazepam (RESTORIL) 15 MG capsule Take 1 capsule (15 mg total) by mouth at bedtime as needed for sleep. 30 capsule 5   venlafaxine XR (EFFEXOR XR) 37.5 MG 24 hr capsule Take 1 capsule (37.5 mg total) by mouth daily. 30 capsule 5   No current facility-administered medications for this visit.    Lab Results:  No results found for this or any previous visit (from the past 48 hour(s)).  Physical Findings: AIMS:  , ,  ,  ,    CIWA:    COWS:     Musculoskeletal: Strength & Muscle Tone: within normal limits Gait & Station: normal Patient leans: Right  Psychiatric Specialty Exam: ROS  There were no vitals taken for this visit.There is no height or weight on file to calculate BMI.  General Appearance: Negative  Eye Contact::  Good  Speech:  Clear and Coherent  Volume:  Normal  Mood:  NA  Affect:  Appropriate  Thought Process:  Coherent  Orientation:  Full (Time, Place, and Person)  Thought Content:  WDL  Suicidal Thoughts:  No  Homicidal Thoughts:  No  Memory:  NA  Judgement:  Good  Insight:  Good  Psychomotor Activity:  Normal  Concentration:  Good  Recall:  Good  Fund of Knowledge:Good  Language: Good  Akathisia:  No  Handed:  Right  AIMS (if indicated):     Assets:  Desire for Improvement  ADL's:  Intact  Cognition: WNL  Sleep:      Treatment Plan Summary: This patient's first problem is that of major depression.  He will continue taking Wellbutrin 150 mg and Effexor as prescribed.  His second problem is that of insomnia.  The patient is doing very well taking Restoril and will continue taking that.  He has episodic anxiety.  For that he takes Xanax 0.5 mg a half as needed.  I shared with him that he can also take Vistaril instead of the Xanax.  In reality I do not mind if he takes some Vistaril and Xanax both of them on a as needed basis.  Will stay on Restoril for sleep.  To be seen again in a few months.   The patient actually is doing better than usual.

## 2022-07-01 ENCOUNTER — Other Ambulatory Visit (HOSPITAL_COMMUNITY): Payer: Self-pay | Admitting: *Deleted

## 2022-07-01 MED ORDER — BUPROPION HCL ER (SR) 100 MG PO TB12: ORAL_TABLET | ORAL | 4 refills | Status: DC

## 2022-07-20 ENCOUNTER — Other Ambulatory Visit: Payer: Self-pay | Admitting: Internal Medicine

## 2022-07-20 DIAGNOSIS — E039 Hypothyroidism, unspecified: Secondary | ICD-10-CM

## 2022-08-27 DIAGNOSIS — I10 Essential (primary) hypertension: Secondary | ICD-10-CM | POA: Diagnosis not present

## 2022-08-27 DIAGNOSIS — I25118 Atherosclerotic heart disease of native coronary artery with other forms of angina pectoris: Secondary | ICD-10-CM | POA: Diagnosis not present

## 2022-08-27 DIAGNOSIS — N189 Chronic kidney disease, unspecified: Secondary | ICD-10-CM | POA: Diagnosis not present

## 2022-08-27 DIAGNOSIS — E785 Hyperlipidemia, unspecified: Secondary | ICD-10-CM | POA: Diagnosis not present

## 2022-09-08 ENCOUNTER — Ambulatory Visit (HOSPITAL_BASED_OUTPATIENT_CLINIC_OR_DEPARTMENT_OTHER): Payer: Medicare Other | Admitting: Psychiatry

## 2022-09-08 DIAGNOSIS — F325 Major depressive disorder, single episode, in full remission: Secondary | ICD-10-CM

## 2022-09-08 MED ORDER — TEMAZEPAM 15 MG PO CAPS: 15.0000 mg | ORAL_CAPSULE | Freq: Every evening | ORAL | 5 refills | Status: DC | PRN

## 2022-09-08 MED ORDER — ALPRAZOLAM 0.5 MG PO TABS: 0.2500 mg | ORAL_TABLET | Freq: Every evening | ORAL | 5 refills | Status: DC | PRN

## 2022-09-08 MED ORDER — LORAZEPAM 0.5 MG PO TABS: 0.5000 mg | ORAL_TABLET | Freq: Every evening | ORAL | 5 refills | Status: DC | PRN

## 2022-09-08 MED ORDER — BUPROPION HCL ER (SR) 100 MG PO TB12: ORAL_TABLET | ORAL | 4 refills | Status: DC

## 2022-09-08 NOTE — Progress Notes (Signed)
Patient ID: Benjamin Hahn, male   DOB: 1933/08/22, 89 y.o.   MRN: [id number] [facility] MD/PA/NP OP Progress Note  09/08/2022 2:31 PM Benjamin Hahn  MRN:  [id number]  Chief Comp   Today the patient had a session progress.  Patient is actually doing well.  His mood is good.  He is a very supportive family.  He is sleeping and eating well has good energy.  He denies any anxiety.  His mood is stable.  He drinks no alcohol and uses no drugs.  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  He does 3 hours and he comes home.  He wants to continue working.  He is not ready to retire.  The patient is able to think and concentrate reasonably well.  His wife takes care of his medicines.  Patient is not suicidal.  He is functioning well.  Past Medical History:  Diagnosis Date   Anxiety    Arthritis    might be in back, no problems   BPH (benign prostatic hyperplasia)    Depression 1990s   GERD (gastroesophageal reflux disease)    occasional   Hypercholesteremia    controled   Hypothyroid    MI (myocardial infarction) [facility])    Shingles May 2014   "Right face, still has some"   Temporal arteritis ([place])    Transient blindness of both eyes     Past Surgical History:  Procedure Laterality Date   ARTERY BIOPSY Right 06/20/2013   Procedure: BIOPSY TEMPORAL ARTERY RIGHT;  Surgeon: Earnstine Regal, MD;  [facility];  Service: General;  Laterality: Right;   CARDIAC CATHETERIZATION N/A 09/02/2015   Procedure: Left Heart Cath and Coronary Angiography;  Surgeon: Charolette Forward, MD;  Location: [place] [facility];  Service: Cardiovascular;  Laterality: N/A;   CARDIAC CATHETERIZATION N/A 09/02/2015   Procedure: Coronary Stent Intervention;  Surgeon: Charolette Forward, MD;  Location: [place] [facility];  Service: Cardiovascular;  Laterality: N/A;  1.  mid RCA      (3.0/28mm Xience) 2.  Mid LAD      (3.0/23mm Xience)   CRANIOTOMY N/A 04/19/2019   Procedure: CRANIOTOMY  HEMATOMA EVACUATION SUBDURAL;  Surgeon: Jovita Gamma, MD;  Location: [place];  Service: Neurosurgery;  Laterality: N/A;  CRANIOTOMY HEMATOMA EVACUATION SUBDURAL   None      Family Psychiatric History:   Family History:  Family History  Problem Relation Age of Onset   Stroke Father        Died, 4s   Stroke Sister        Living, 23   Heart disease Mother        Died, 43   Healthy Daughter    Diabetes Maternal Grandmother    Hypertension Neg Hx     Social History:  Social History   Socioeconomic History   Marital status: Married    Spouse name: Not on file   Number of children: Not on file   Years of education: Not on file   Highest education level: Not on file  Occupational History   Not on file  Tobacco Use   Smoking status: Former    Types: Cigarettes    Quit date: 08/24/1966    Years since quitting: 56.0    Passive exposure: Never   Smokeless tobacco: Never  Substance and Sexual Activity   Alcohol use: Not Currently    Comment: socially   Drug use:  Never   Sexual activity: Not Currently    Partners: Female  Other Topics Concern   Not on file  Social History Narrative   ** Merged History Encounter **       Currently works as a Midwife.  Lives with wife and they have two healthy daughters.   Social Determinants of Health   Financial Resource Strain: Low Risk  (01/29/2022)   Overall Financial Resource Strain (CARDIA)    Difficulty of Paying Living Expenses: Not hard at all  Food Insecurity: No Food Insecurity (01/29/2022)   Hunger Vital Sign    Worried About Running Out of Food in the Last Year: Never true    Ran Out of Food in the Last Year: Never true  Transportation Needs: Unknown (01/29/2022)   PRAPARE - Hydrologist (Medical): No    Lack of Transportation (Non-Medical): Not on file  Physical Activity: Inactive (01/29/2022)   Exercise Vital Sign    Days of Exercise per Week: 0 days    Minutes of Exercise per Session: 0 min   Stress: No Stress Concern Present (01/29/2022)   [place] of Stress : Not at all  Social Connections: [place] (01/29/2022)   Social Connection and Isolation Panel [NHANES]    Frequency of Communication with Friends and Family: More than three times a week    Frequency of Social Gatherings with Friends and Family: More than three times a week    Attends Religious Services: More than 4 times per year    Active Member of Clubs or Organizations: Yes    Attends Music therapist: More than 4 times per year    Marital Status: Married    Allergies:  No Known Allergies   Metabolic Disorder Labs: Lab Results  Component Value Date   HGBA1C 5.5 04/14/2019   No results found for: "PROLACTIN" Lab Results  Component Value Date   CHOL 147 10/02/2020   TRIG 55.0 10/02/2020   HDL 79.30 10/02/2020   CHOLHDL 2 10/02/2020   VLDL 11.0 10/02/2020   LDLCALC 57 10/02/2020   LDLCALC 51 06/01/2019     Current Medications: Current Outpatient Medications  Medication Sig Dispense Refill   acetaminophen (TYLENOL) 325 MG tablet Take 325-650 mg by mouth every 6 (six) hours as needed for mild pain or headache.     ALPRAZolam (XANAX) 0.5 MG tablet Take 0.5 tablets (0.25 mg total) by mouth at bedtime as needed. 25 tablet 5   buPROPion ER (WELLBUTRIN SR) 100 MG 12 hr tablet Take one tablet (100 mg total) by mouth each morning. 30 tablet 4   hydrOXYzine (ATARAX/VISTARIL) 25 MG tablet Take 1 tablet (25 mg total) by mouth at bedtime. (Patient taking differently: Take 25 mg by mouth at bedtime as needed.) 30 tablet 1   levothyroxine (SYNTHROID) 88 MCG tablet Take 1 tablet (88 mcg total) by mouth daily. 90 tablet 1   LORazepam (ATIVAN) 0.5 MG tablet Take 1 tablet (0.5 mg total) by mouth at bedtime as needed. 30 tablet 5   tamsulosin (FLOMAX) 0.4 MG CAPS capsule Take 0.8 mg by mouth daily.     temazepam (RESTORIL)  15 MG capsule Take 1 capsule (15 mg total) by mouth at bedtime as needed for sleep. 30 capsule 5   venlafaxine XR (EFFEXOR XR) 37.5 MG 24 hr capsule Take 1 capsule (37.5 mg total) by mouth daily. 30 capsule 5  No current facility-administered medications for this visit.    Neurologic: Headache: No Seizure: No Paresthesias: No  Musculoskeletal: Strength & Muscle Tone: within normal limits Gait & Station: normal Patient leans: NA  Psychiatric Specialty Exam: ROS  There were no vitals taken for this visit.There is no height or weight on file to calculate BMI.  General Appearance: Fairly Groomed  Eye Contact:  Good  Speech:  Clear and Coherent  Volume:  Normal  Mood:  Euthymic  Affect:  Appropriate  Thought Process:  Coherent  Orientation:  Full (Time, Place, and Person)  Thought Content:  WDL  Suicidal Thoughts:  No  Homicidal Thoughts:  No  Memory:  NA  Judgement:  Good  Insight:  Good  Psychomotor Activity:  Normal  Concentration:  Good  Recall:  Good  Fund of Knowledge: Good  Language: Good  Akathisia:  No  Handed:  Right  AIMS (if indicated):    Assets:  Desire for Improvement  ADL's:  Intact  Cognition: WNL  Sleep:      Treatment/plan   Today patient is doing very well.  His first problem is that of major clinical depression.  He takes Effexor and Wellbutrin.  His second problem is insomnia.  He switches off between Restoril and Ativan and will take a as needed Xanax when needed.  He is actually doing very well.  He is functioning well. Virtual Visit via Telephone Note  I connected with Benjamin Hahn on 09/08/22 at  2:00 PM EST by telephone and verified that I am speaking with the correct person using two identifiers.  Location: Patient: Home Provider: Office   I discussed the limitations, risks, security and privacy concerns of performing an evaluation and management service by telephone and the availability of in person appointments. I also  discussed with the patient that there may be a patient responsible charge related to this service. The patient expressed understanding and agreed to proceed.   I discussed the assessment and treatment plan with the patient. The patient was provided an opportunity to ask questions and all were answered. The patient agreed with the plan and demonstrated an understanding of the instructions.   The patient was advised to call back or seek an in-person evaluation if the symptoms worsen or if the condition fails to improve as anticipated.  I provided 30 minutes of non-face-to-face time during this encounter.   Jerral Ralph, MD  Virtual Visit via Telephone Note  I connected with Benjamin Hahn on 09/08/22 at  2:00 PM EST by telephone and verified that I am speaking with the correct person using two identifiers.  Location: Patient: home Provider: office   I discussed the limitations, risks, security and privacy concerns of performing an evaluation and management service by telephone and the availability of in person appointments. I also discussed with the patient that there may be a patient responsible charge related to this service. The patient expressed understanding and agreed to proceed.   History of Present Illness:    Observations/Objective:   Assessment and Plan:   Follow Up Instructions:    I discussed the assessment and treatment plan with the patient. The patient was provided an opportunity to ask questions and all were answered. The patient agreed with the plan and demonstrated an understanding of the instructions.   The patient was advised to call back or seek an in-person evaluation if the symptoms worsen or if the condition fails to improve as anticipated.  I provided 30  minutes of non-face-to-face time during this encounter.   Jerral Ralph, MD  09/08/2022, 2:31 PM [facility] MD Progress Note  09/08/2022 2:31 PM Benjamin Hahn  MRN:  [id number] Subjective:   Feels well Principal Problem: Major depression, chronic, mild/residual Diagnosis:  Major depression, recurrent residual Today the patient is doing fairly well. He's been having some medical problems. He recently went for Thanksgiving to a length but unfortunately had a urinary tract infection. He also since then has been coughing and recently started on antibiotic. Emotionally he is fairly stable. He denies daily depression. He stays very active at work. He's been unable to exercise. The patient is sleeping well and has an increase in his appetite. His energy is good. Is no problems thinking or concentrating. He is recently having a productive cough but is no shortness of breath. He denies any chest pain. Is a stable relationship with his wife. His kidneys are good. He has 4 grandchildren. Everybody in his home is good. He only issue is that a number mornings he awakens and he feels oversedated. It occurs point of his wife feels uncomfortable with him driving will taken to work. Today reviewed his medications and is evident that he likely needs reduction. He also notes that his memory has change in his and is good. 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 Patient Active Problem List   Diagnosis Date Noted   Brain atrophy ([place]) [G31.9] 05/13/2022   Stage 3b chronic kidney disease ([place]) [N18.32] 08/19/2021   Psychophysiological insomnia [F51.04] 12/07/2019   Deficiency anemia [D53.9] 06/01/2019   Vitamin D deficiency [E55.9] 06/01/2019   Chronic idiopathic constipation [K59.04] 06/01/2019   Mild intermittent asthma with acute exacerbation [J45.21] 02/08/2018   Hypothyroidism [E03.9] 03/14/2013   Hyperlipidemia LDL goal <70 [E78.5] 03/14/2013   Benign prostatic hyperplasia [N40.0] 03/14/2013   Hypogonadism, male [E29.1] 03/14/2013   Erectile dysfunction [N52.9] 03/14/2013   Total Time spent with patient: 30 minutes  Past Psychiatric History:    Past Medical History:  Past Medical History:  Diagnosis Date   Anxiety    Arthritis    might be in back, no problems   BPH (benign prostatic hyperplasia)    Depression 1990s   GERD (gastroesophageal reflux disease)    occasional   Hypercholesteremia    controled   Hypothyroid    MI (myocardial infarction) [facility])    Shingles May 2014   "Right face, still has some"   Temporal arteritis ([place])    Transient blindness of both eyes     Past Surgical History:  Procedure Laterality Date   ARTERY BIOPSY Right 06/20/2013   Procedure: BIOPSY TEMPORAL ARTERY RIGHT;  Surgeon: Earnstine Regal, MD;  [facility];  Service: General;  Laterality: Right;   CARDIAC CATHETERIZATION N/A 09/02/2015   Procedure: Left Heart Cath and Coronary Angiography;  Surgeon: Charolette Forward, MD;  Location: [place] [facility];  Service: Cardiovascular;  Laterality: N/A;   CARDIAC CATHETERIZATION N/A 09/02/2015   Procedure: Coronary Stent Intervention;  Surgeon: Charolette Forward, MD;  Location: [place] [facility];  Service: Cardiovascular;  Laterality: N/A;  1.  mid RCA      (3.0/28mm Xience) 2.  Mid LAD      (3.0/23mm Xience)   CRANIOTOMY N/A 04/19/2019   Procedure: CRANIOTOMY HEMATOMA EVACUATION SUBDURAL;  Surgeon: Jovita Gamma, MD;  Location: [place];  Service: Neurosurgery;  Laterality: N/A;  CRANIOTOMY HEMATOMA EVACUATION SUBDURAL   None  Family History:  Family History  Problem Relation Age of Onset   Stroke Father        Died, 31s   Stroke Sister        Living, 1   Heart disease Mother        Died, 89   Healthy Daughter    Diabetes Maternal Grandmother    Hypertension Neg Hx    Family Psychiatric  History:  Social History:  Social History   Substance and Sexual Activity  Alcohol Use Not Currently   Comment: socially     Social History   Substance and Sexual Activity  Drug Use Never    Social History   Socioeconomic History   Marital status: Married    Spouse name: Not on file    Number of children: Not on file   Years of education: Not on file   Highest education level: Not on file  Occupational History   Not on file  Tobacco Use   Smoking status: Former    Types: Cigarettes    Quit date: 08/24/1966    Years since quitting: 56.0    Passive exposure: Never   Smokeless tobacco: Never  Substance and Sexual Activity   Alcohol use: Not Currently    Comment: socially   Drug use: Never   Sexual activity: Not Currently    Partners: Female  Other Topics Concern   Not on file  Social History Narrative   ** Merged History Encounter **       Currently works as a Midwife.  Lives with wife and they have two healthy daughters.   Social Determinants of Health   Financial Resource Strain: Low Risk  (01/29/2022)   Overall Financial Resource Strain (CARDIA)    Difficulty of Paying Living Expenses: Not hard at all  Food Insecurity: No Food Insecurity (01/29/2022)   Hunger Vital Sign    Worried About Running Out of Food in the Last Year: Never true    Ran Out of Food in the Last Year: Never true  Transportation Needs: Unknown (01/29/2022)   PRAPARE - Hydrologist (Medical): No    Lack of Transportation (Non-Medical): Not on file  Physical Activity: Inactive (01/29/2022)   Exercise Vital Sign    Days of Exercise per Week: 0 days    Minutes of Exercise per Session: 0 min  Stress: No Stress Concern Present (01/29/2022)   [place]    Feeling of Stress : Not at all  Social Connections: [place] (01/29/2022)   Social Connection and Isolation Panel [NHANES]    Frequency of Communication with Friends and Family: More than three times a week    Frequency of Social Gatherings with Friends and Family: More than three times a week    Attends Religious Services: More than 4 times per year    Active Member of [employer] or Organizations: Yes    Attends Music therapist:  More than 4 times per year    Marital Status: Married   Additional Social History:                         Sleep: Good  Appetite:  Good  Current Medications: Current Outpatient Medications  Medication Sig Dispense Refill   acetaminophen (TYLENOL) 325 MG tablet Take 325-650 mg by mouth every 6 (six) hours as needed for mild pain or headache.  ALPRAZolam (XANAX) 0.5 MG tablet Take 0.5 tablets (0.25 mg total) by mouth at bedtime as needed. 25 tablet 5   buPROPion ER (WELLBUTRIN SR) 100 MG 12 hr tablet Take one tablet (100 mg total) by mouth each morning. 30 tablet 4   hydrOXYzine (ATARAX/VISTARIL) 25 MG tablet Take 1 tablet (25 mg total) by mouth at bedtime. (Patient taking differently: Take 25 mg by mouth at bedtime as needed.) 30 tablet 1   levothyroxine (SYNTHROID) 88 MCG tablet Take 1 tablet (88 mcg total) by mouth daily. 90 tablet 1   LORazepam (ATIVAN) 0.5 MG tablet Take 1 tablet (0.5 mg total) by mouth at bedtime as needed. 30 tablet 5   tamsulosin (FLOMAX) 0.4 MG CAPS capsule Take 0.8 mg by mouth daily.     temazepam (RESTORIL) 15 MG capsule Take 1 capsule (15 mg total) by mouth at bedtime as needed for sleep. 30 capsule 5   venlafaxine XR (EFFEXOR XR) 37.5 MG 24 hr capsule Take 1 capsule (37.5 mg total) by mouth daily. 30 capsule 5   No current facility-administered medications for this visit.    Lab Results:  No results found for this or any previous visit (from the past 48 hour(s)).  Physical Findings: AIMS:  , ,  ,  ,    CIWA:    COWS:     Musculoskeletal: Strength & Muscle Tone: within normal limits Gait & Station: normal Patient leans: Right  Psychiatric Specialty Exam: ROS  There were no vitals taken for this visit.There is no height or weight on file to calculate BMI.  General Appearance: Negative  Eye Contact::  Good  Speech:  Clear and Coherent  Volume:  Normal  Mood:  NA  Affect:  Appropriate  Thought Process:  Coherent  Orientation:  Full  (Time, Place, and Person)  Thought Content:  WDL  Suicidal Thoughts:  No  Homicidal Thoughts:  No  Memory:  NA  Judgement:  Good  Insight:  Good  Psychomotor Activity:  Normal  Concentration:  Good  Recall:  Good  Fund of Knowledge:Good  Language: Good  Akathisia:  No  Handed:  Right  AIMS (if indicated):     Assets:  Desire for Improvement  ADL's:  Intact  Cognition: WNL  Sleep:      Treatment [facility]. [facility] MD Progress Note  09/08/2022 2:31 PM Benjamin Hahn  MRN:  [id number] Subjective:  Feels well Principal Problem: Major depression, chronic, mild/residual Diagnosis:  Major depression, recurrent residual Today the patient is doing fairly well. He's been having some medical problems. He recently went for Thanksgiving to a length but unfortunately had a urinary tract infection. He also since then has been coughing and recently started on antibiotic. Emotionally he is fairly stable. He denies daily depression. He stays very active at work. He's been unable to exercise. The patient is sleeping well and has an increase in his appetite. His energy is good. Is no problems thinking or concentrating. He is recently having a productive cough but is no shortness of breath. He denies any chest pain. Is a stable relationship with his wife. His kidneys are good. He has 4 grandchildren. Everybody in his home is good. He only issue is that a number mornings he awakens and he feels oversedated. It occurs point of his wife feels uncomfortable with him driving will taken to work. Today reviewed his medications and is evident that he likely needs reduction. He also notes that his memory has change in his and  is good. 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 Patient Active Problem List   Diagnosis Date Noted   Brain atrophy ([place]) [G31.9] 05/13/2022   Stage 3b chronic kidney disease ([place]) [N18.32] 08/19/2021   Psychophysiological insomnia [F51.04]  12/07/2019   Deficiency anemia [D53.9] 06/01/2019   Vitamin D deficiency [E55.9] 06/01/2019   Chronic idiopathic constipation [K59.04] 06/01/2019   Mild intermittent asthma with acute exacerbation [J45.21] 02/08/2018   Hypothyroidism [E03.9] 03/14/2013   Hyperlipidemia LDL goal <70 [E78.5] 03/14/2013   Benign prostatic hyperplasia [N40.0] 03/14/2013   Hypogonadism, male [E29.1] 03/14/2013   Erectile dysfunction [N52.9] 03/14/2013   Total Time spent with patient: 30 minutes  Past Psychiatric History:   Past Medical History:  Past Medical History:  Diagnosis Date   Anxiety    Arthritis    might be in back, no problems   BPH (benign prostatic hyperplasia)    Depression 1990s   GERD (gastroesophageal reflux disease)    occasional   Hypercholesteremia    controled   Hypothyroid    MI ([facility]ocardial infarction) [facility])    Shingles May 2014   "Right face, still has some"   Temporal arteritis ([place])    Transient blindness of both eyes     Past Surgical History:  Procedure Laterality Date   ARTERY BIOPSY Right 06/20/2013   Procedure: BIOPSY TEMPORAL ARTERY RIGHT;  Surgeon: Earnstine Regal, MD;  [facility];  Service: General;  Laterality: Right;   CARDIAC CATHETERIZATION N/A 09/02/2015   Procedure: Left Heart Cath and Coronary Angiography;  Surgeon: Charolette Forward, MD;  Location: [place] [facility];  Service: Cardiovascular;  Laterality: N/A;   CARDIAC CATHETERIZATION N/A 09/02/2015   Procedure: Coronary Stent Intervention;  Surgeon: Charolette Forward, MD;  Location: [place] [facility];  Service: Cardiovascular;  Laterality: N/A;  1.  mid RCA      (3.0/28mm Xience) 2.  Mid LAD      (3.0/23mm Xience)   CRANIOTOMY N/A 04/19/2019   Procedure: CRANIOTOMY HEMATOMA EVACUATION SUBDURAL;  Surgeon: Jovita Gamma, MD;  Location: [place];  Service: Neurosurgery;  Laterality: N/A;  CRANIOTOMY HEMATOMA EVACUATION SUBDURAL   None     Family History:  Family History  Problem Relation Age of Onset    Stroke Father        Died, 38s   Stroke Sister        Living, 21   Heart disease Mother        Died, 60   Healthy Daughter    Diabetes Maternal Grandmother    Hypertension Neg Hx    Family Psychiatric  History:  Social History:  Social History   Substance and Sexual Activity  Alcohol Use Not Currently   Comment: socially     Social History   Substance and Sexual Activity  Drug Use Never    Social History   Socioeconomic History   Marital status: Married    Spouse name: Not on file   Number of children: Not on file   Years of education: Not on file   Highest education level: Not on file  Occupational History   Not on file  Tobacco Use   Smoking status: Former    Types: Cigarettes    Quit date: 08/24/1966    Years since quitting: 56.0    Passive exposure: Never   Smokeless tobacco: Never  Substance and Sexual Activity   Alcohol use: Not Currently    Comment:  socially   Drug use: Never   Sexual activity: Not Currently    Partners: Female  Other Topics Concern   Not on file  Social History Narrative   ** Merged History Encounter **       Currently works as a Midwife.  Lives with wife and they have two healthy daughters.   Social Determinants of Health   Financial Resource Strain: Low Risk  (01/29/2022)   Overall Financial Resource Strain (CARDIA)    Difficulty of Paying Living Expenses: Not hard at all  Food Insecurity: No Food Insecurity (01/29/2022)   Hunger Vital Sign    Worried About Running Out of Food in the Last Year: Never true    Ran Out of Food in the Last Year: Never true  Transportation Needs: Unknown (01/29/2022)   PRAPARE - Hydrologist (Medical): No    Lack of Transportation (Non-Medical): Not on file  Physical Activity: Inactive (01/29/2022)   Exercise Vital Sign    Days of Exercise per Week: 0 days    Minutes of Exercise per Session: 0 min  Stress: No Stress Concern Present (01/29/2022)   [place]    Feeling of Stress : Not at all  Social Connections: [place] (01/29/2022)   Social Connection and Isolation Panel [NHANES]    Frequency of Communication with Friends and Family: More than three times a week    Frequency of Social Gatherings with Friends and Family: More than three times a week    Attends Religious Services: More than 4 times per year    Active Member of [employer] or Organizations: Yes    Attends Music therapist: More than 4 times per year    Marital Status: Married   Additional Social History:                         Sleep: Good  Appetite:  Good  Current Medications: Current Outpatient Medications  Medication Sig Dispense Refill   acetaminophen (TYLENOL) 325 MG tablet Take 325-650 mg by mouth every 6 (six) hours as needed for mild pain or headache.     ALPRAZolam (XANAX) 0.5 MG tablet Take 0.5 tablets (0.25 mg total) by mouth at bedtime as needed. 25 tablet 5   buPROPion ER (WELLBUTRIN SR) 100 MG 12 hr tablet Take one tablet (100 mg total) by mouth each morning. 30 tablet 4   hydrOXYzine (ATARAX/VISTARIL) 25 MG tablet Take 1 tablet (25 mg total) by mouth at bedtime. (Patient taking differently: Take 25 mg by mouth at bedtime as needed.) 30 tablet 1   levothyroxine (SYNTHROID) 88 MCG tablet Take 1 tablet (88 mcg total) by mouth daily. 90 tablet 1   LORazepam (ATIVAN) 0.5 MG tablet Take 1 tablet (0.5 mg total) by mouth at bedtime as needed. 30 tablet 5   tamsulosin (FLOMAX) 0.4 MG CAPS capsule Take 0.8 mg by mouth daily.     temazepam (RESTORIL) 15 MG capsule Take 1 capsule (15 mg total) by mouth at bedtime as needed for sleep. 30 capsule 5   venlafaxine XR (EFFEXOR XR) 37.5 MG 24 hr capsule Take 1 capsule (37.5 mg total) by mouth daily. 30 capsule 5   No current facility-administered medications for this visit.    Lab Results:  No results found for this or any previous visit  (from the past 48 hour(s)).  Physical Findings: AIMS:  , ,  ,  ,  CIWA:    COWS:     Musculoskeletal: Strength & Muscle Tone: within normal limits Gait & Station: normal Patient leans: Right  Psychiatric Specialty Exam: ROS  There were no vitals taken for this visit.There is no height or weight on file to calculate BMI.  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  This condition he takes Effexor and Wellbutrin and does well.  His second problem is insomnia.  He sleeps very well taking Restoril or Ativan at night.  He has Xanax as a backup in case he wakes up.  He takes a very small dose of Xanax and does not take it every night.  Generally he takes either Restoril or Ativan to sleep.  His mood is very good.  His next visit he knows he will be able to see me in person.  I have asked him to come with his wife.

## 2022-12-08 ENCOUNTER — Ambulatory Visit (HOSPITAL_COMMUNITY): Payer: Medicare Other | Admitting: Psychiatry

## 2022-12-10 DIAGNOSIS — N529 Male erectile dysfunction, unspecified: Secondary | ICD-10-CM | POA: Diagnosis not present

## 2022-12-10 DIAGNOSIS — I2510 Atherosclerotic heart disease of native coronary artery without angina pectoris: Secondary | ICD-10-CM | POA: Diagnosis not present

## 2022-12-10 DIAGNOSIS — I10 Essential (primary) hypertension: Secondary | ICD-10-CM | POA: Diagnosis not present

## 2022-12-10 DIAGNOSIS — E782 Mixed hyperlipidemia: Secondary | ICD-10-CM | POA: Diagnosis not present

## 2022-12-10 DIAGNOSIS — Z8679 Personal history of other diseases of the circulatory system: Secondary | ICD-10-CM | POA: Diagnosis not present

## 2022-12-21 ENCOUNTER — Other Ambulatory Visit (HOSPITAL_COMMUNITY): Payer: Self-pay | Admitting: Psychiatry

## 2022-12-21 ENCOUNTER — Other Ambulatory Visit: Payer: Self-pay | Admitting: Internal Medicine

## 2022-12-21 DIAGNOSIS — F325 Major depressive disorder, single episode, in full remission: Secondary | ICD-10-CM

## 2023-01-08 ENCOUNTER — Other Ambulatory Visit (HOSPITAL_COMMUNITY): Payer: Self-pay | Admitting: Psychiatry

## 2023-01-08 DIAGNOSIS — F325 Major depressive disorder, single episode, in full remission: Secondary | ICD-10-CM

## 2023-01-08 MED ORDER — VENLAFAXINE HCL ER 37.5 MG PO CP24: 37.5000 mg | ORAL_CAPSULE | Freq: Every day | ORAL | 5 refills | Status: DC

## 2023-01-20 ENCOUNTER — Other Ambulatory Visit: Payer: Self-pay | Admitting: Internal Medicine

## 2023-01-20 DIAGNOSIS — E039 Hypothyroidism, unspecified: Secondary | ICD-10-CM

## 2023-02-09 ENCOUNTER — Ambulatory Visit (INDEPENDENT_AMBULATORY_CARE_PROVIDER_SITE_OTHER): Payer: Medicare Other

## 2023-02-09 ENCOUNTER — Other Ambulatory Visit: Payer: Self-pay | Admitting: Internal Medicine

## 2023-02-09 ENCOUNTER — Telehealth: Payer: Self-pay | Admitting: Internal Medicine

## 2023-02-09 VITALS — Ht 68.0 in | Wt 185.0 lb

## 2023-02-09 DIAGNOSIS — E039 Hypothyroidism, unspecified: Secondary | ICD-10-CM

## 2023-02-09 DIAGNOSIS — Z0000 Encounter for general adult medical examination without abnormal findings: Secondary | ICD-10-CM | POA: Diagnosis not present

## 2023-02-09 MED ORDER — LEVOTHYROXINE SODIUM 88 MCG PO TABS: 88.0000 ug | ORAL_TABLET | Freq: Every day | ORAL | 0 refills | Status: DC

## 2023-02-09 NOTE — Patient Instructions (Addendum)
Benjamin Hahn , Thank you for taking time to come for your Medicare Wellness Visit. I appreciate your ongoing commitment to your health goals. Please review the following plan we discussed and let me know if I can assist you in the future.   These are the goals we discussed:  Goals       Cope with Chronic Pain      Timeframe:  Long-Range Goal Priority:  High Start Date:   04/09/2021                          Expected End Date:  10/09/2021                    Follow Up Date 10/09/2021   - learn relaxation techniques - practice acceptance of chronic pain - practice relaxation or meditation daily - spend time with positive people - use relaxation during pain    Why is this important?   Stress makes chronic pain feel worse.  Feelings like depression, anxiety, stress and anger can make your body more sensitive to pain.  Learning ways to cope with stress or depression may help you find some relief from the pain.        Manage My Medicine      Timeframe:  Long-Range Goal Priority:  High Start Date:   04/09/2021                         Expected End Date:  10/09/2021                     Follow Up Date 10/08/2021   - keep a list of all the medicines I take; vitamins and herbals too - learn to read medicine labels - use a pillbox to sort medicine - use an alarm clock or phone to remind me to take my medicine    Why is this important?   These steps will help you keep on track with your medicines.      Stay Active (pt-stated)      To maintain my current health status by continuing to eat healthy.        This is a list of the screening recommended for you and due dates:  Health Maintenance  Topic Date Due   Zoster (Shingles) Vaccine (1 of 2) 05/12/2023*   Flu Shot  03/25/2023   Medicare Annual Wellness Visit  02/09/2024   DTaP/Tdap/Td vaccine (2 - Td or Tdap) 11/27/2024   Pneumonia Vaccine  Completed   HPV Vaccine  Aged Out   COVID-19 Vaccine  Discontinued  *Topic was postponed. The  date shown is not the original due date.    Advanced directives: Please bring a copy of your health care power of attorney and living will to the office to be added to your chart at your convenience.   Conditions/risks identified: None  Next appointment: Follow up in one year for your annual wellness visit.   Preventive Care 87 Years and Older, Male  Preventive care refers to lifestyle choices and visits with your health care provider that can promote health and wellness. What does preventive care include? A yearly physical exam. This is also called an annual well check. Dental exams once or twice a year. Routine eye exams. Ask your health care provider how often you should have your eyes checked. Personal lifestyle choices, including: Daily care of your teeth and gums.  Regular physical activity. Eating a healthy diet. Avoiding tobacco and drug use. Limiting alcohol use. Practicing safe sex. Taking low doses of aspirin every day. Taking vitamin and mineral supplements as recommended by your health care provider. What happens during an annual well check? The services and screenings done by your health care provider during your annual well check will depend on your age, overall health, lifestyle risk factors, and family history of disease. Counseling  Your health care provider may ask you questions about your: Alcohol use. Tobacco use. Drug use. Emotional well-being. Home and relationship well-being. Sexual activity. Eating habits. History of falls. Memory and ability to understand (cognition). Work and work Astronomer. Screening  You may have the following tests or measurements: Height, weight, and BMI. Blood pressure. Lipid and cholesterol levels. These may be checked every 5 years, or more frequently if you are over 42 years old. Skin check. Lung cancer screening. You may have this screening every year starting at age 71 if you have a 30-pack-year history of smoking and  currently smoke or have quit within the past 15 years. Fecal occult blood test (FOBT) of the stool. You may have this test every year starting at age 53. Flexible sigmoidoscopy or colonoscopy. You may have a sigmoidoscopy every 5 years or a colonoscopy every 10 years starting at age 78. Prostate cancer screening. Recommendations will vary depending on your family history and other risks. Hepatitis C blood test. Hepatitis B blood test. Sexually transmitted disease (STD) testing. Diabetes screening. This is done by checking your blood sugar (glucose) after you have not eaten for a while (fasting). You may have this done every 1-3 years. Abdominal aortic aneurysm (AAA) screening. You may need this if you are a current or former smoker. Osteoporosis. You may be screened starting at age 71 if you are at high risk. Talk with your health care provider about your test results, treatment options, and if necessary, the need for more tests. Vaccines  Your health care provider may recommend certain vaccines, such as: Influenza vaccine. This is recommended every year. Tetanus, diphtheria, and acellular pertussis (Tdap, Td) vaccine. You may need a Td booster every 10 years. Zoster vaccine. You may need this after age 28. Pneumococcal 13-valent conjugate (PCV13) vaccine. One dose is recommended after age 69. Pneumococcal polysaccharide (PPSV23) vaccine. One dose is recommended after age 107. Talk to your health care provider about which screenings and vaccines you need and how often you need them. This information is not intended to replace advice given to you by your health care provider. Make sure you discuss any questions you have with your health care provider. Document Released: 09/06/2015 Document Revised: 04/29/2016 Document Reviewed: 06/11/2015 Elsevier Interactive Patient Education  2017 [employer].  Fall Prevention in the Home Falls can cause injuries. They can happen to people of all ages.  There are many things you can do to make your home safe and to help prevent falls. What can I do on the outside of my home? Regularly fix the edges of walkways and driveways and fix any cracks. Remove anything that might make you trip as you walk through a door, such as a raised step or threshold. Trim any bushes or trees on the path to your home. Use bright outdoor lighting. Clear any walking paths of anything that might make someone trip, such as rocks or tools. Regularly check to see if handrails are loose or broken. Make sure that both sides of any steps have handrails. Any raised  decks and porches should have guardrails on the edges. Have any leaves, snow, or ice cleared regularly. Use sand or salt on walking paths during winter. Clean up any spills in your garage right away. This includes oil or grease spills. What can I do in the bathroom? Use night lights. Install grab bars by the toilet and in the tub and shower. Do not use towel bars as grab bars. Use non-skid mats or decals in the tub or shower. If you need to sit down in the shower, use a plastic, non-slip stool. Keep the floor dry. Clean up any water that spills on the floor as soon as it happens. Remove soap buildup in the tub or shower regularly. Attach bath mats securely with double-sided non-slip rug tape. Do not have throw rugs and other things on the floor that can make you trip. What can I do in the bedroom? Use night lights. Make sure that you have a light by your bed that is easy to reach. Do not use any sheets or blankets that are too big for your bed. They should not hang down onto the floor. Have a firm chair that has side arms. You can use this for support while you get dressed. Do not have throw rugs and other things on the floor that can make you trip. What can I do in the kitchen? Clean up any spills right away. Avoid walking on wet floors. Keep items that you use a lot in easy-to-reach places. If you need  to reach something above you, use a strong step stool that has a grab bar. Keep electrical cords out of the way. Do not use floor polish or wax that makes floors slippery. If you must use wax, use non-skid floor wax. Do not have throw rugs and other things on the floor that can make you trip. What can I do with my stairs? Do not leave any items on the stairs. Make sure that there are handrails on both sides of the stairs and use them. Fix handrails that are broken or loose. Make sure that handrails are as long as the stairways. Check any carpeting to make sure that it is firmly attached to the stairs. Fix any carpet that is loose or worn. Avoid having throw rugs at the top or bottom of the stairs. If you do have throw rugs, attach them to the floor with carpet tape. Make sure that you have a light switch at the top of the stairs and the bottom of the stairs. If you do not have them, ask someone to add them for you. What else can I do to help prevent falls? Wear shoes that: Do not have high heels. Have rubber bottoms. Are comfortable and fit you well. Are closed at the toe. Do not wear sandals. If you use a stepladder: Make sure that it is fully opened. Do not climb a closed stepladder. Make sure that both sides of the stepladder are locked into place. Ask someone to hold it for you, if possible. Clearly mark and make sure that you can see: Any grab bars or handrails. First and last steps. Where the edge of each step is. Use tools that help you move around (mobility aids) if they are needed. These include: Canes. Walkers. Scooters. Crutches. Turn on the lights when you go into a dark area. Replace any light bulbs as soon as they burn out. Set up your furniture so you have a clear path. Avoid moving your furniture around.  If any of your floors are uneven, fix them. If there are any pets around you, be aware of where they are. Review your medicines with your doctor. Some medicines can  make you feel dizzy. This can increase your chance of falling. Ask your doctor what other things that you can do to help prevent falls. This information is not intended to replace advice given to you by your health care provider. Make sure you discuss any questions you have with your health care provider. Document Released: 06/06/2009 Document Revised: 01/16/2016 Document Reviewed: 09/14/2014 Elsevier Interactive Patient Education  2017 [employer].

## 2023-02-09 NOTE — Progress Notes (Signed)
Subjective:   Benjamin Hahn is a 89 y.o. male who presents for Medicare Annual/Subsequent preventive examination.  Visit Complete: Virtual  I connected with  Benjamin Hahn on 02/09/23 by a audio enabled telemedicine application and verified that I am speaking with the correct person using two identifiers.  Patient Location: Home  Provider Location: Home Office  I discussed the limitations of evaluation and management by telemedicine. The patient expressed understanding and agreed to proceed.  Patient Medicare AWV questionnaire was completed by the patient on ; I have confirmed that all information answered by patient is correct and no changes since this date.  Review of Systems     Cardiac Risk Factors include: advanced age (>12men, >24 women);male gender     Objective:    Today's Vitals   02/09/23 1536  Weight: 185 lb (83.9 kg)  Height: 5\' 8"  (1.727 m)   Body mass index is 28.13 kg/m.     02/09/2023    3:47 PM 01/29/2022    3:31 PM 01/23/2021    3:58 PM 07/29/2020    8:50 AM 08/15/2019    8:08 AM 04/21/2019    2:01 PM 04/19/2019    2:20 PM  Advanced Directives  Does Patient Have a Medical Advance Directive? Yes Yes Yes Yes Yes No No  Type of Estate agent of [place];Living will Living will;Healthcare Power of [employer] Power of [place];Living will    Does patient want to make changes to medical advance directive?  No - Patient declined Yes (MAU/Ambulatory/Procedural Areas - Information given) No - Patient declined No - Patient declined    Copy of Healthcare Power of Attorney in Chart? No - copy requested No - copy requested       Would patient like information on creating a medical advance directive?      Yes (Inpatient - patient defers creating a medical advance directive at this time - Information given) No - Patient declined    Current Medications (verified) Outpatient Encounter Medications as of 02/09/2023  Medication Sig    acetaminophen (TYLENOL) 325 MG tablet Take 325-650 mg by mouth every 6 (six) hours as needed for mild pain or headache.   ALPRAZolam (XANAX) 0.5 MG tablet Take 0.5 tablets (0.25 mg total) by mouth at bedtime as needed.   buPROPion ER (WELLBUTRIN SR) 100 MG 12 hr tablet Take one tablet (100 mg total) by mouth each morning.   hydrOXYzine (ATARAX/VISTARIL) 25 MG tablet Take 1 tablet (25 mg total) by mouth at bedtime. (Patient taking differently: Take 25 mg by mouth at bedtime as needed.)   levothyroxine (SYNTHROID) 88 MCG tablet Take 1 tablet (88 mcg total) by mouth daily.   LORazepam (ATIVAN) 0.5 MG tablet Take 1 tablet (0.5 mg total) by mouth at bedtime as needed.   tamsulosin (FLOMAX) 0.4 MG CAPS capsule TAKE 2 CAPSULES AT BEDTIME.   temazepam (RESTORIL) 15 MG capsule Take 1 capsule (15 mg total) by mouth at bedtime as needed for sleep.   venlafaxine XR (EFFEXOR XR) 37.5 MG 24 hr capsule Take 1 capsule (37.5 mg total) by mouth daily.   venlafaxine XR (EFFEXOR-XR) 37.5 MG 24 hr capsule Take 1 capsule (37.5 mg total) by mouth daily.   No facility-administered encounter medications on file as of 02/09/2023.    Allergies (verified) Patient has no known allergies.   History: Past Medical History:  Diagnosis Date   Anxiety    Arthritis    might be in back, no  problems   BPH (benign prostatic hyperplasia)    Depression 1990s   GERD (gastroesophageal reflux disease)    occasional   Hypercholesteremia    controled   Hypothyroid    MI (myocardial infarction) [facility])    Shingles May 2014   "Right face, still has some"   Temporal arteritis (HCC)    Transient blindness of both eyes    Past Surgical History:  Procedure Laterality Date   ARTERY BIOPSY Right 06/20/2013   Procedure: BIOPSY TEMPORAL ARTERY RIGHT;  Surgeon: Velora Heckler, MD;  Location: [facility] ORS;  Service: General;  Laterality: Right;   CARDIAC CATHETERIZATION N/A 09/02/2015   Procedure: Left Heart Cath and Coronary Angiography;   Surgeon: Rinaldo Cloud, MD;  [facility];  Service: Cardiovascular;  Laterality: N/A;   CARDIAC CATHETERIZATION N/A 09/02/2015   Procedure: Coronary Stent Intervention;  Surgeon: Rinaldo Cloud, MD;  Location: [facility] INVASIVE CV LAB;  Service: Cardiovascular;  Laterality: N/A;  1.  mid RCA      (3.0/28mm Xience) 2.  Mid LAD      (3.0/23mm Xience)   CRANIOTOMY N/A 04/19/2019   Procedure: CRANIOTOMY HEMATOMA EVACUATION SUBDURAL;  Surgeon: Shirlean Kelly, MD;  Location: [facility];  Service: Neurosurgery;  Laterality: N/A;  CRANIOTOMY HEMATOMA EVACUATION SUBDURAL   None     Family History  Problem Relation Age of Onset   Stroke Father        Died, 58s   Stroke Sister        Living, 5   Heart disease Mother        Died, 51   Healthy Daughter    Diabetes Maternal Grandmother    Hypertension Neg Hx    Social History   Socioeconomic History   Marital status: Married    Spouse name: Not on file   Number of children: Not on file   Years of education: Not on file   Highest education level: Not on file  Occupational History   Not on file  Tobacco Use   Smoking status: Former    Types: Cigarettes    Quit date: 08/24/1966    Years since quitting: 56.5    Passive exposure: Never   Smokeless tobacco: Never  Substance and Sexual Activity   Alcohol use: Not Currently    Comment: socially   Drug use: Never   Sexual activity: Not Currently    Partners: Female  Other Topics Concern   Not on file  Social History Narrative   ** Merged History Encounter **       Currently works as a Scientist, clinical (histocompatibility and immunogenetics).  Lives with wife and they have two healthy daughters.   Social Determinants of Health   Financial Resource Strain: Low Risk  (02/09/2023)   Overall Financial Resource Strain (CARDIA)    Difficulty of Paying Living Expenses: Not hard at all  Food Insecurity: No Food Insecurity (02/09/2023)   Hunger Vital Sign    Worried About Running Out of Food in the Last Year: Never true    Ran Out  of Food in the Last Year: Never true  Transportation Needs: No Transportation Needs (02/09/2023)   PRAPARE - Administrator, Civil Service (Medical): No    Lack of Transportation (Non-Medical): No  Physical Activity: Inactive (02/09/2023)   Exercise Vital Sign    Days of Exercise per Week: 0 days    Minutes of Exercise per Session: 0 min  Stress: No Stress Concern Present (02/09/2023)   [place]  [employer] of [employer] - Occupational Stress Questionnaire    Feeling of Stress : Not at all  Social Connections: Socially Integrated (02/09/2023)   Social Connection and Isolation Panel [NHANES]    Frequency of Communication with Friends and Family: More than three times a week    Frequency of Social Gatherings with Friends and Family: More than three times a week    Attends Religious Services: More than 4 times per year    Active Member of [employer] or Organizations: Yes    Attends Engineer, structural: More than 4 times per year    Marital Status: Married    Tobacco Counseling Counseling given: Not Answered   Clinical Intake:  Pre-visit preparation completed: No  Pain : No/denies pain  Activities of Daily Living    02/09/2023    3:46 PM  In your present state of health, do you have any difficulty performing the following activities:  Hearing? 0  Vision? 0  Difficulty concentrating or making decisions? 0  Walking or climbing stairs? 0  Dressing or bathing? 0  Doing errands, shopping? 0  Preparing Food and eating ? N  Using the Toilet? N  In the past six months, have you accidently leaked urine? N  Do you have problems with loss of bowel control? N  Managing your Medications? N  Managing your Finances? N  Housekeeping or managing your Housekeeping? N    Patient Care Team: Etta Grandchild, MD as PCP - General (Internal Medicine) Etta Grandchild, MD (Internal Medicine) Blima Ledger, OD as Consulting Physician (Optometry) Sz[place]at, Vinnie Level, [facility]  (Inactive) as Pharmacist (Pharmacist)  Indicate any recent Medical Services you may have received from other than [facility] providers in the past year (date may be approximate).     Assessment:   This is a routine wellness examination for Gaje.  Hearing/Vision screen Hearing Screening - Comments:: Denies hearing difficulties   Vision Screening - Comments:: Wears reading  glasses - up to date with routine eye exams with  Dr Hyacinth Meeker  Dietary issues and exercise activities discussed:     Goals Addressed               This Visit's Progress     Stay Active (pt-stated)         Depression Screen    02/09/2023    3:46 PM 05/12/2022    4:02 PM 01/29/2022    3:23 PM 08/19/2021    3:24 PM 01/23/2021    4:27 PM 10/02/2020    3:41 PM 12/07/2019    1:43 PM  PHQ 2/9 Scores  PHQ - 2 Score 0 0 0 0 0 0 1  PHQ- 9 Score  0  0  0 7    Fall Risk    02/09/2023    3:47 PM 01/29/2022    3:22 PM 01/23/2021    4:29 PM 10/02/2020    3:41 PM 05/24/2019    9:28 AM  Fall Risk   Falls in the past year? 0 0 0 0 1  Comment     last fall July 2020  Number falls in past yr: 0 0 0 0 1  Injury with Fall? 0 0 0 0   Risk for fall due to : No Fall Risks Impaired balance/gait Impaired balance/gait    Follow up Falls prevention discussed Falls evaluation completed Falls evaluation completed      MEDICARE RISK AT HOME:   TIMED UP AND GO:  Was the test performed?  No    Cognitive Function:        02/09/2023    3:48 PM 01/29/2022    3:33 PM  6CIT Screen  What Year? 0 points 0 points  What month? 0 points 0 points  What time? 0 points 0 points  Count back from 20 0 points 0 points  Months in reverse 0 points 0 points  Repeat phrase 0 points 0 points  Total Score 0 points 0 points    Immunizations Immunization History  Administered Date(s) Administered   Fluad Quad(high Dose 65+) 05/04/2019, 05/12/2022   Influenza, High Dose Seasonal PF 06/28/2014, 06/25/2016, 06/08/2017, 06/03/2018    Influenza,inj,Quad PF,6+ Mos 08/11/2013   Influenza-Unspecified 06/24/2021   PFIZER(Purple Top)SARS-COV-2 Vaccination 09/08/2019, 09/27/2019, 04/11/2020, 11/22/2020   Pfizer Covid-19 Vaccine Bivalent Booster 33yrs & up 06/27/2021   Pneumococcal Conjugate-13 02/14/2014   Pneumococcal Polysaccharide-23 07/28/2018   Tdap 11/28/2014   Zoster, Live 02/01/2010    TDAP status: Up to date  Flu Vaccine status: Up to date  Pneumococcal vaccine status: Up to date  Covid-19 vaccine status: Completed vaccines  Qualifies for Shingles Vaccine? Yes   Zostavax completed No   Shingrix Completed?: No.    Education has been provided regarding the importance of this vaccine. Patient has been advised to call insurance company to determine out of pocket expense if they have not yet received this vaccine. Advised may also receive vaccine at local pharmacy or Health Dept. Verbalized acceptance and understanding.  Screening Tests Health Maintenance  Topic Date Due   Zoster Vaccines- Shingrix (1 of 2) 05/12/2023 (Originally 01/31/1983)   INFLUENZA VACCINE  03/25/2023   Medicare Annual Wellness (AWV)  02/09/2024   DTaP/Tdap/Td (2 - Td or Tdap) 11/27/2024   Pneumonia Vaccine 59+ Years old  Completed   HPV VACCINES  Aged Out   COVID-19 Vaccine  Discontinued    Health Maintenance  There are no preventive care reminders to display for this patient.   Colorectal cancer screening: No longer required.   Lung Cancer Screening: (Low Dose CT Chest recommended if Age 80-80 years, 20 pack-year currently smoking OR have quit w/in 15years.) does not qualify.     Additional Screening:  Hepatitis C Screening: does not qualify; Completed   Vision Screening: Recommended annual ophthalmology exams for early detection of glaucoma and other disorders of the eye. Is the patient up to date with their annual eye exam?  Yes  Who is the provider or what is the name of the office in which the patient attends annual eye  exams? Dr Hyacinth Meeker If pt is not established with a provider, would they like to be referred to a provider to establish care? No .   Dental Screening: Recommended annual dental exams for proper oral hygiene    Community Resource Referral / Chronic Care Management:  CRR required this visit?  No   CCM required this visit?  No     Plan:     I have personally reviewed and noted the following in the patient's chart:   Medical and social history Use of alcohol, tobacco or illicit drugs  Current medications and supplements including opioid prescriptions. Patient is not currently taking opioid prescriptions. Functional ability and status Nutritional status Physical activity Advanced directives List of other physicians Hospitalizations, surgeries, and ER visits in previous 12 months Vitals Screenings to include cognitive, depression, and falls Referrals and appointments  In addition, I have reviewed and discussed with patient certain preventive protocols, quality metrics, and best practice recommendations.  A written personalized care plan for preventive services as well as general preventive health recommendations were provided to patient.     Tillie Rung, LPN   11/30/8117   After Visit Summary: (MyChart) Due to this being a telephonic visit, the after visit summary with patients personalized plan was offered to patient via MyChart   Nurse Notes: None

## 2023-02-09 NOTE — Telephone Encounter (Signed)
Patient is scheduled for soonest availability of 03/02/2023. He is out of his medication.

## 2023-02-09 NOTE — Telephone Encounter (Signed)
Prescription Request  02/09/2023  LOV: 05/12/2022  What is the name of the medication or equipment? levothyroxine (SYNTHROID) 88 MCG tablet   Have you contacted your pharmacy to request a refill? Yes   Which pharmacy would you like this sent to?  [facility] [place], [place] - [address] [place] [address] Phone: [phone] Fax: [phone]    Patient notified that their request is being sent to the clinical staff for review and that they should receive a response within 2 business days.   Please advise at Mobile [phone] (mobile)    Patient is scheduled for soonest availability of 03/02/2023. He is out of his medication.

## 2023-02-16 DIAGNOSIS — R3912 Poor urinary stream: Secondary | ICD-10-CM | POA: Diagnosis not present

## 2023-02-16 DIAGNOSIS — N401 Enlarged prostate with lower urinary tract symptoms: Secondary | ICD-10-CM | POA: Diagnosis not present

## 2023-02-16 DIAGNOSIS — R3914 Feeling of incomplete bladder emptying: Secondary | ICD-10-CM | POA: Diagnosis not present

## 2023-02-16 DIAGNOSIS — R351 Nocturia: Secondary | ICD-10-CM | POA: Diagnosis not present

## 2023-03-02 ENCOUNTER — Encounter: Payer: Self-pay | Admitting: Internal Medicine

## 2023-03-02 ENCOUNTER — Ambulatory Visit (INDEPENDENT_AMBULATORY_CARE_PROVIDER_SITE_OTHER): Payer: Medicare Other | Admitting: Internal Medicine

## 2023-03-02 VITALS — BP 114/48 | HR 76 | Temp 97.9°F | Resp 16 | Ht 68.0 in | Wt 179.0 lb

## 2023-03-02 DIAGNOSIS — D539 Nutritional anemia, unspecified: Secondary | ICD-10-CM

## 2023-03-02 DIAGNOSIS — D538 Other specified nutritional anemias: Secondary | ICD-10-CM

## 2023-03-02 DIAGNOSIS — K5904 Chronic idiopathic constipation: Secondary | ICD-10-CM

## 2023-03-02 DIAGNOSIS — E039 Hypothyroidism, unspecified: Secondary | ICD-10-CM

## 2023-03-02 DIAGNOSIS — N1832 Chronic kidney disease, stage 3b: Secondary | ICD-10-CM | POA: Diagnosis not present

## 2023-03-02 LAB — CBC WITH DIFFERENTIAL/PLATELET
Basophils Absolute: 0.1 10*3/uL (ref 0.0–0.1)
Basophils Relative: 1 % (ref 0.0–3.0)
Eosinophils Absolute: 0.2 10*3/uL (ref 0.0–0.7)
Eosinophils Relative: 3.2 % (ref 0.0–5.0)
HCT: 32 % — ABNORMAL LOW (ref 39.0–52.0)
Hemoglobin: 10.5 g/dL — ABNORMAL LOW (ref 13.0–17.0)
Lymphocytes Relative: 23.3 % (ref 12.0–46.0)
Lymphs Abs: 1.5 10*3/uL (ref 0.7–4.0)
MCHC: 32.9 g/dL (ref 30.0–36.0)
MCV: 91.5 fl (ref 78.0–100.0)
Monocytes Absolute: 0.5 10*3/uL (ref 0.1–1.0)
Monocytes Relative: 8.6 % (ref 3.0–12.0)
Neutro Abs: 4 10*3/uL (ref 1.4–7.7)
Neutrophils Relative %: 63.9 % (ref 43.0–77.0)
Platelets: 193 10*3/uL (ref 150.0–400.0)
RBC: 3.49 Mil/uL — ABNORMAL LOW (ref 4.22–5.81)
RDW: 13.9 % (ref 11.5–15.5)
WBC: 6.3 10*3/uL (ref 4.0–10.5)

## 2023-03-02 LAB — HEPATIC FUNCTION PANEL
ALT: 12 U/L (ref 0–53)
AST: 16 U/L (ref 0–37)
Albumin: 3.9 g/dL (ref 3.5–5.2)
Alkaline Phosphatase: 50 U/L (ref 39–117)
Bilirubin, Direct: 0.1 mg/dL (ref 0.0–0.3)
Total Bilirubin: 0.4 mg/dL (ref 0.2–1.2)
Total Protein: 6.4 g/dL (ref 6.0–8.3)

## 2023-03-02 LAB — BASIC METABOLIC PANEL
BUN: 35 mg/dL — ABNORMAL HIGH (ref 6–23)
CO2: 28 mEq/L (ref 19–32)
Calcium: 9 mg/dL (ref 8.4–10.5)
Chloride: 106 mEq/L (ref 96–112)
Creatinine, Ser: 1.55 mg/dL — ABNORMAL HIGH (ref 0.40–1.50)
GFR: 39.84 mL/min — ABNORMAL LOW (ref >60.00)
Glucose, Bld: 99 mg/dL (ref 70–99)
Potassium: 4.6 mEq/L (ref 3.5–5.1)
Sodium: 141 mEq/L (ref 135–145)

## 2023-03-02 LAB — IBC + FERRITIN
Ferritin: 198.7 ng/mL (ref 22.0–322.0)
Iron: 78 ug/dL (ref 42–165)
Saturation Ratios: 32.2 % (ref 20.0–50.0)
TIBC: 242.2 ug/dL — ABNORMAL LOW (ref 250.0–450.0)
Transferrin: 173 mg/dL — ABNORMAL LOW (ref 212.0–360.0)

## 2023-03-02 LAB — FOLATE: Folate: 16.2 ng/mL (ref >5.9)

## 2023-03-02 LAB — VITAMIN B12: Vitamin B-12: 341 pg/mL (ref 211–911)

## 2023-03-02 LAB — TSH: TSH: 2.79 u[IU]/mL (ref 0.35–5.50)

## 2023-03-02 MED ORDER — UNITHROID 88 MCG PO TABS
88.0000 ug | ORAL_TABLET | Freq: Every day | ORAL | 1 refills | Status: DC
Start: 2023-03-02 — End: 2023-08-26

## 2023-03-02 NOTE — Progress Notes (Signed)
Subjective:  Patient ID: Benjamin Hahn, male    DOB: October 22, 1932  Age: 89 y.o. MRN: [id number]  CC: Anemia and Hypothyroidism   HPI DIONEL ARCHEY presents for f/up ---  Discussed the use of AI scribe software for clinical note transcription with the patient, who gave verbal consent to proceed.  History of Present Illness   The patient presents with a recent history of feeling weak, dizzy, and lightheaded. He has also experienced a slight weight loss. The patient has a history of hypertension, but recent blood pressure readings have been lower than usual, around 100 systolic, which is a change from his usual readings. The patient denies any swelling in his legs or feet and does not report any chest pain or shortness of breath.  The patient has had a virtual visit with a nurse since his last in-person visit. He has seen his heart doctor a few months ago. The patient's spouse reports feeding him well and he denies any issues with dehydration or anemia. The patient's thyroid dosing is being monitored due to the recent changes in his blood pressure and weight.       Outpatient Medications Prior to Visit  Medication Sig Dispense Refill   acetaminophen (TYLENOL) 325 MG tablet Take 325-650 mg by mouth every 6 (six) hours as needed for mild pain or headache.     buPROPion ER (WELLBUTRIN SR) 100 MG 12 hr tablet Take one tablet (100 mg total) by mouth each morning. 30 tablet 4   finasteride (PROSCAR) 5 MG tablet Take 5 mg by mouth daily.     tamsulosin (FLOMAX) 0.4 MG CAPS capsule TAKE 2 CAPSULES AT BEDTIME. 180 capsule 0   temazepam (RESTORIL) 15 MG capsule Take 1 capsule (15 mg total) by mouth at bedtime as needed for sleep. 30 capsule 5   ALPRAZolam (XANAX) 0.5 MG tablet Take 0.5 tablets (0.25 mg total) by mouth at bedtime as needed. 25 tablet 5   hydrOXYzine (ATARAX/VISTARIL) 25 MG tablet Take 1 tablet (25 mg total) by mouth at bedtime. (Patient taking differently: Take 25 mg by mouth at  bedtime as needed.) 30 tablet 1   levothyroxine (SYNTHROID) 88 MCG tablet Take 1 tablet (88 mcg total) by mouth daily. 30 tablet 0   LORazepam (ATIVAN) 0.5 MG tablet Take 1 tablet (0.5 mg total) by mouth at bedtime as needed. 30 tablet 5   venlafaxine XR (EFFEXOR XR) 37.5 MG 24 hr capsule Take 1 capsule (37.5 mg total) by mouth daily. 30 capsule 5   venlafaxine XR (EFFEXOR-XR) 37.5 MG 24 hr capsule Take 1 capsule (37.5 mg total) by mouth daily. 30 capsule 5   No facility-administered medications prior to visit.    ROS Review of Systems  Constitutional:  Positive for unexpected weight change. Negative for appetite change, chills, diaphoresis and fatigue.  HENT: Negative.    Eyes: Negative.   Respiratory: Negative.  Negative for cough, chest tightness, shortness of breath and wheezing.   Cardiovascular:  Negative for chest pain, palpitations and leg swelling.  Gastrointestinal:  Positive for constipation. Negative for abdominal pain, blood in stool, diarrhea, nausea and vomiting.  Endocrine: Negative.   Genitourinary: Negative.  Negative for difficulty urinating.  Musculoskeletal:  Positive for gait problem.  Skin: Negative.   Neurological:  Positive for dizziness, weakness and light-headedness. Negative for numbness.  Hematological:  Negative for adenopathy. Does not bruise/bleed easily.  Psychiatric/Behavioral: Negative.      Objective:  BP (!) 114/48 (BP Location: Left Arm, Patient  Position: Sitting, Cuff Size: Large)   Pulse 76   Temp 97.9 F (36.6 C) (Oral)   Resp 16   Ht 5\' 8"  (1.727 m)   Wt 179 lb (81.2 kg)   SpO2 93%   BMI 27.22 kg/m   BP Readings from Last 3 Encounters:  03/02/23 (!) 114/48  05/12/22 126/64  08/19/21 128/70    Wt Readings from Last 3 Encounters:  03/02/23 179 lb (81.2 kg)  02/09/23 185 lb (83.9 kg)  05/12/22 185 lb (83.9 kg)    Physical Exam Vitals reviewed.  Constitutional:      Appearance: He is not ill-appearing.  HENT:     Nose: Nose  normal.     Mouth/Throat:     Mouth: Mucous membranes are moist.  Eyes:     General: No scleral icterus.    Conjunctiva/sclera: Conjunctivae normal.  Cardiovascular:     Rate and Rhythm: Normal rate and regular rhythm.     Heart sounds: No murmur heard. Pulmonary:     Effort: Pulmonary effort is normal.     Breath sounds: No stridor. No wheezing, rhonchi or rales.  Abdominal:     General: Abdomen is flat.     Palpations: There is no mass.     Tenderness: There is no abdominal tenderness. There is no guarding.     Hernia: No hernia is present.  Musculoskeletal:        General: Normal range of motion.  Skin:    General: Skin is warm and dry.  Neurological:     Mental Status: He is alert. Mental status is at baseline.  Psychiatric:        Mood and Affect: Mood normal.        Behavior: Behavior normal.     Lab Results  Component Value Date   WBC 6.3 03/02/2023   HGB 10.5 (L) 03/02/2023   HCT 32.0 (L) 03/02/2023   PLT 193.0 03/02/2023   GLUCOSE 99 03/02/2023   CHOL 147 10/02/2020   TRIG 55.0 10/02/2020   HDL 79.30 10/02/2020   LDLCALC 57 10/02/2020   ALT 12 03/02/2023   AST 16 03/02/2023   NA 141 03/02/2023   K 4.6 03/02/2023   CL 106 03/02/2023   CREATININE 1.55 (H) 03/02/2023   BUN 35 (H) 03/02/2023   CO2 28 03/02/2023   TSH 2.79 03/02/2023   INR 1.1 10/11/2019   HGBA1C 5.5 04/14/2019    CT Head Wo Contrast  Result Date: 10/11/2019 CLINICAL DATA:  89 year old who fell off of a curb while at a shopping center earlier today with loss of consciousness and laceration to the LEFT eyebrow. Patient is amnestic to the event. Initial encounter. Personal history of subdural hematoma. EXAM: CT HEAD WITHOUT CONTRAST CT CERVICAL SPINE WITHOUT CONTRAST TECHNIQUE: Multidetector CT imaging of the head and cervical spine was performed following the standard protocol without intravenous contrast. Multiplanar CT image reconstructions of the cervical spine were also generated.  COMPARISON:  05/31/2019 and earlier. FINDINGS: CT HEAD FINDINGS Brain: Moderate cortical, deep and cerebellar atrophy as noted previously. No mass lesion. No midline shift. No acute hemorrhage or hematoma. No extra-axial fluid collections. No evidence of acute infarction. Dural thickening beneath the RIGHT frontal craniotomy flap. Vascular: Moderate to severe BILATERAL carotid siphon and mild BILATERAL vertebral artery atherosclerosis. No hyperdense vessel. Skull: Prior RIGHT frontal craniotomy. No skull fracture or other focal osseous abnormality involving the skull. Sinuses/Orbits: Preseptal soft tissue swelling/hematoma anterior to the LEFT orbit. No evidence of intraorbital  hemorrhage. Other: None. CT CERVICAL SPINE FINDINGS Alignment: Anatomic posterior alignment. Straightening of the usual cervical lordosis. Facet joints anatomically aligned throughout with severe diffuse degenerative changes. Skull base and vertebrae: No fractures identified involving the cervical spine. Coronal reformatted images demonstrate an intact craniocervical junction, intact dens and intact lateral masses throughout. Degenerative changes at the C1-C2 articulation with calcified pannus POSTERIOR to the dens. Soft tissues and spinal canal: No evidence of paraspinous or spinal canal hematoma. No evidence of spinal stenosis. Disc levels: Severe disc space narrowing and associated endplate hypertrophic changes at C6-7. Moderate disc space narrowing at C5-6 and C7-T1. Calcification within the C2-3 disc. Combination of facet and uncinate hypertrophy account for multilevel foraminal stenoses including severe BILATERAL C3-4, moderate BILATERAL C4-5, severe BILATERAL C5-6, severe BILATERAL C6-7. Upper chest: Visualized lung apices clear. Mild atherosclerosis involving the visualized proximal great vessels. Other: DISH involving the visualized UPPER thoracic spine. BILATERAL cervical carotid atherosclerosis. IMPRESSION: 1. No acute intracranial  abnormality. 2. Moderate generalized atrophy. 3. No cervical spine fractures identified. 4. Multilevel degenerative disc disease, spondylosis and foraminal stenoses throughout the cervical spine as detailed above. 5. Preseptal soft tissue swelling/hematoma anterior to the LEFT orbit without evidence of intraorbital hemorrhage. Electronically Signed   By: Hulan Saas M.D.   On: 10/11/2019 18:10   CT Cervical Spine Wo Contrast  Result Date: 10/11/2019 CLINICAL DATA:  89 year old who fell off of a curb while at a shopping center earlier today with loss of consciousness and laceration to the LEFT eyebrow. Patient is amnestic to the event. Initial encounter. Personal history of subdural hematoma. EXAM: CT HEAD WITHOUT CONTRAST CT CERVICAL SPINE WITHOUT CONTRAST TECHNIQUE: Multidetector CT imaging of the head and cervical spine was performed following the standard protocol without intravenous contrast. Multiplanar CT image reconstructions of the cervical spine were also generated. COMPARISON:  05/31/2019 and earlier. FINDINGS: CT HEAD FINDINGS Brain: Moderate cortical, deep and cerebellar atrophy as noted previously. No mass lesion. No midline shift. No acute hemorrhage or hematoma. No extra-axial fluid collections. No evidence of acute infarction. Dural thickening beneath the RIGHT frontal craniotomy flap. Vascular: Moderate to severe BILATERAL carotid siphon and mild BILATERAL vertebral artery atherosclerosis. No hyperdense vessel. Skull: Prior RIGHT frontal craniotomy. No skull fracture or other focal osseous abnormality involving the skull. Sinuses/Orbits: Preseptal soft tissue swelling/hematoma anterior to the LEFT orbit. No evidence of intraorbital hemorrhage. Other: None. CT CERVICAL SPINE FINDINGS Alignment: Anatomic posterior alignment. Straightening of the usual cervical lordosis. Facet joints anatomically aligned throughout with severe diffuse degenerative changes. Skull base and vertebrae: No  fractures identified involving the cervical spine. Coronal reformatted images demonstrate an intact craniocervical junction, intact dens and intact lateral masses throughout. Degenerative changes at the C1-C2 articulation with calcified pannus POSTERIOR to the dens. Soft tissues and spinal canal: No evidence of paraspinous or spinal canal hematoma. No evidence of spinal stenosis. Disc levels: Severe disc space narrowing and associated endplate hypertrophic changes at C6-7. Moderate disc space narrowing at C5-6 and C7-T1. Calcification within the C2-3 disc. Combination of facet and uncinate hypertrophy account for multilevel foraminal stenoses including severe BILATERAL C3-4, moderate BILATERAL C4-5, severe BILATERAL C5-6, severe BILATERAL C6-7. Upper chest: Visualized lung apices clear. Mild atherosclerosis involving the visualized proximal great vessels. Other: DISH involving the visualized UPPER thoracic spine. BILATERAL cervical carotid atherosclerosis. IMPRESSION: 1. No acute intracranial abnormality. 2. Moderate generalized atrophy. 3. No cervical spine fractures identified. 4. Multilevel degenerative disc disease, spondylosis and foraminal stenoses throughout the cervical spine as detailed above. 5.  Preseptal soft tissue swelling/hematoma anterior to the LEFT orbit without evidence of intraorbital hemorrhage. Electronically Signed   By: Hulan Saas M.D.   On: 10/11/2019 18:10    Assessment & Plan:   Acquired hypothyroidism- He is euthyroid. -     Hepatic function panel; Future -     TSH; Future -     Unithroid; Take 1 tablet (88 mcg total) by mouth daily before breakfast.  Dispense: 90 tablet; Refill: 1  Stage 3b chronic kidney disease (HCC)- Renal function is stable. -     Basic metabolic panel; Future  Deficiency anemia -     CBC with Differential/Platelet; Future -     Vitamin B12; Future -     Folate; Future -     Zinc; Future -     Vitamin B1; Future -     IBC + Ferritin; Future -      Reticulocytes; Future  Chronic idiopathic constipation -     Basic metabolic panel; Future -     Hepatic function panel; Future -     TSH; Future  Anemia due to zinc deficiency -     Zinc Gluconate; Take 1 tablet (50 mg total) by mouth daily.  Dispense: 90 tablet; Refill: 1     Follow-up: Return in about 6 months (around 09/02/2023).  Sanda Linger, MD

## 2023-03-02 NOTE — Patient Instructions (Signed)

## 2023-03-08 LAB — VITAMIN B1: Vitamin B1 (Thiamine): 17 nmol/L (ref 8–30)

## 2023-03-08 LAB — RETICULOCYTES
ABS Retic: 41040 cells/uL (ref 25000–90000)
Retic Ct Pct: 1.2 %

## 2023-03-08 LAB — ZINC: Zinc: 54 ug/dL — ABNORMAL LOW (ref 60–130)

## 2023-03-09 DIAGNOSIS — D538 Other specified nutritional anemias: Secondary | ICD-10-CM | POA: Insufficient documentation

## 2023-03-09 MED ORDER — ZINC GLUCONATE 50 MG PO TABS
50.0000 mg | ORAL_TABLET | Freq: Every day | ORAL | 1 refills | Status: DC
Start: 2023-03-09 — End: 2023-10-27

## 2023-03-27 ENCOUNTER — Other Ambulatory Visit (HOSPITAL_COMMUNITY): Payer: Self-pay | Admitting: Psychiatry

## 2023-04-28 DIAGNOSIS — R351 Nocturia: Secondary | ICD-10-CM | POA: Diagnosis not present

## 2023-04-28 DIAGNOSIS — R3914 Feeling of incomplete bladder emptying: Secondary | ICD-10-CM | POA: Diagnosis not present

## 2023-04-28 DIAGNOSIS — N401 Enlarged prostate with lower urinary tract symptoms: Secondary | ICD-10-CM | POA: Diagnosis not present

## 2023-04-29 ENCOUNTER — Other Ambulatory Visit (HOSPITAL_COMMUNITY): Payer: Self-pay | Admitting: Psychiatry

## 2023-05-04 ENCOUNTER — Other Ambulatory Visit (HOSPITAL_COMMUNITY): Payer: Self-pay | Admitting: Psychiatry

## 2023-05-10 ENCOUNTER — Other Ambulatory Visit (HOSPITAL_COMMUNITY): Payer: Self-pay

## 2023-05-10 DIAGNOSIS — Z23 Encounter for immunization: Secondary | ICD-10-CM | POA: Diagnosis not present

## 2023-05-10 MED ORDER — BUPROPION HCL ER (SR) 100 MG PO TB12: ORAL_TABLET | ORAL | 1 refills | Status: DC

## 2023-06-22 ENCOUNTER — Ambulatory Visit (HOSPITAL_COMMUNITY): Payer: Medicare Other | Admitting: Psychiatry

## 2023-08-09 DIAGNOSIS — R311 Benign essential microscopic hematuria: Secondary | ICD-10-CM | POA: Diagnosis not present

## 2023-08-09 DIAGNOSIS — N401 Enlarged prostate with lower urinary tract symptoms: Secondary | ICD-10-CM | POA: Diagnosis not present

## 2023-08-09 DIAGNOSIS — R3914 Feeling of incomplete bladder emptying: Secondary | ICD-10-CM | POA: Diagnosis not present

## 2023-08-10 DIAGNOSIS — M1711 Unilateral primary osteoarthritis, right knee: Secondary | ICD-10-CM | POA: Diagnosis not present

## 2023-08-10 LAB — LAB REPORT - SCANNED: EGFR: 56.5

## 2023-08-19 ENCOUNTER — Other Ambulatory Visit: Payer: Self-pay | Admitting: Internal Medicine

## 2023-08-19 DIAGNOSIS — E039 Hypothyroidism, unspecified: Secondary | ICD-10-CM

## 2023-09-06 ENCOUNTER — Ambulatory Visit: Payer: Medicare Other | Admitting: Internal Medicine

## 2023-10-07 ENCOUNTER — Ambulatory Visit: Payer: Medicare Other | Admitting: Internal Medicine

## 2023-10-27 ENCOUNTER — Ambulatory Visit (INDEPENDENT_AMBULATORY_CARE_PROVIDER_SITE_OTHER): Payer: Medicare Other | Admitting: Internal Medicine

## 2023-10-27 ENCOUNTER — Encounter: Payer: Self-pay | Admitting: Internal Medicine

## 2023-10-27 VITALS — BP 116/60 | HR 66 | Temp 97.9°F | Ht 68.0 in | Wt 174.6 lb

## 2023-10-27 DIAGNOSIS — D538 Other specified nutritional anemias: Secondary | ICD-10-CM | POA: Diagnosis not present

## 2023-10-27 DIAGNOSIS — N1832 Chronic kidney disease, stage 3b: Secondary | ICD-10-CM | POA: Diagnosis not present

## 2023-10-27 DIAGNOSIS — E039 Hypothyroidism, unspecified: Secondary | ICD-10-CM | POA: Diagnosis not present

## 2023-10-27 DIAGNOSIS — I491 Atrial premature depolarization: Secondary | ICD-10-CM | POA: Diagnosis not present

## 2023-10-27 LAB — CBC WITH DIFFERENTIAL/PLATELET
Basophils Absolute: 0 10*3/uL (ref 0.0–0.1)
Basophils Relative: 0.6 % (ref 0.0–3.0)
Eosinophils Absolute: 0.1 10*3/uL (ref 0.0–0.7)
Eosinophils Relative: 1.3 % (ref 0.0–5.0)
HCT: 29.8 % — ABNORMAL LOW (ref 39.0–52.0)
Hemoglobin: 9.9 g/dL — ABNORMAL LOW (ref 13.0–17.0)
Lymphocytes Relative: 31.7 % (ref 12.0–46.0)
Lymphs Abs: 1.2 10*3/uL (ref 0.7–4.0)
MCHC: 33.2 g/dL (ref 30.0–36.0)
MCV: 92.2 fl (ref 78.0–100.0)
Monocytes Absolute: 0.5 10*3/uL (ref 0.1–1.0)
Monocytes Relative: 13.7 % — ABNORMAL HIGH (ref 3.0–12.0)
Neutro Abs: 2.1 10*3/uL (ref 1.4–7.7)
Neutrophils Relative %: 52.7 % (ref 43.0–77.0)
Platelets: 157 10*3/uL (ref 150.0–400.0)
RBC: 3.23 Mil/uL — ABNORMAL LOW (ref 4.22–5.81)
RDW: 14.1 % (ref 11.5–15.5)
WBC: 3.9 10*3/uL — ABNORMAL LOW (ref 4.0–10.5)

## 2023-10-27 LAB — BASIC METABOLIC PANEL
BUN: 31 mg/dL — ABNORMAL HIGH (ref 6–23)
CO2: 29 meq/L (ref 19–32)
Calcium: 8.6 mg/dL (ref 8.4–10.5)
Chloride: 104 meq/L (ref 96–112)
Creatinine, Ser: 1.39 mg/dL (ref 0.40–1.50)
GFR: 45.19 mL/min — ABNORMAL LOW (ref >60.00)
Glucose, Bld: 90 mg/dL (ref 70–99)
Potassium: 4.6 meq/L (ref 3.5–5.1)
Sodium: 138 meq/L (ref 135–145)

## 2023-10-27 LAB — HEPATIC FUNCTION PANEL
ALT: 20 U/L (ref 0–53)
AST: 30 U/L (ref 0–37)
Albumin: 3.8 g/dL (ref 3.5–5.2)
Alkaline Phosphatase: 42 U/L (ref 39–117)
Bilirubin, Direct: 0.1 mg/dL (ref 0.0–0.3)
Total Bilirubin: 0.3 mg/dL (ref 0.2–1.2)
Total Protein: 6.3 g/dL (ref 6.0–8.3)

## 2023-10-27 LAB — TSH: TSH: 2.92 u[IU]/mL (ref 0.35–5.50)

## 2023-10-27 MED ORDER — ZINC GLUCONATE 50 MG PO TABS: 50.0000 mg | ORAL_TABLET | Freq: Every day | ORAL | 1 refills | Status: AC

## 2023-10-27 NOTE — Progress Notes (Signed)
 Subjective:  Patient ID: Benjamin Hahn, male    DOB: 1932-11-17  Age: 89 y.o. MRN: [id number]  CC: Anemia and Hypothyroidism   HPI BRANDON WIECHMAN presents for f/up --   Discussed the use of AI scribe software for clinical note transcription with the patient, who gave verbal consent to proceed.  History of Present Illness   MIKELL KAZLAUSKAS is an 89 year old male who presents for routine follow-up and EKG monitoring due to a history of low heart rate.  He feels good overall with no dizziness, lightheadedness, or new weakness. No sensation of blood loss.  He is unsure about the adequacy of his thyroid medication dose but denies symptoms such as weight gain or constipation.  He mentions an upcoming move to a retirement community in [place] to be near his daughter. This involves preparing his house for sale, indicating a significant life change.       Outpatient Medications Prior to Visit  Medication Sig Dispense Refill   acetaminophen (TYLENOL) 325 MG tablet Take 325-650 mg by mouth every 6 (six) hours as needed for mild pain or headache.     finasteride (PROSCAR) 5 MG tablet Take 5 mg by mouth daily.     levothyroxine (SYNTHROID) 88 MCG tablet Take 1 tablet (88 mcg total) by mouth daily before breakfast. 90 tablet 1   tamsulosin (FLOMAX) 0.4 MG CAPS capsule TAKE 2 CAPSULES AT BEDTIME. 180 capsule 0   zinc gluconate 50 MG tablet Take 1 tablet (50 mg total) by mouth daily. 90 tablet 1   buPROPion ER (WELLBUTRIN SR) 100 MG 12 hr tablet Take one tablet (100 mg total) by mouth each morning. Patient has a follow up on 10/28 30 tablet 1   temazepam (RESTORIL) 15 MG capsule Take 1 capsule (15 mg total) by mouth at bedtime as needed for sleep. 30 capsule 5   No facility-administered medications prior to visit.    ROS Review of Systems  Constitutional: Negative.  Negative for diaphoresis and fatigue.  HENT: Negative.  Negative for trouble swallowing.   Eyes: Negative.    Respiratory: Negative.  Negative for cough, chest tightness, shortness of breath and wheezing.   Cardiovascular:  Negative for chest pain, palpitations and leg swelling.  Gastrointestinal:  Negative for abdominal pain, constipation, diarrhea and vomiting.  Genitourinary: Negative.  Negative for difficulty urinating.  Musculoskeletal:  Positive for back pain.  Skin: Negative.   Neurological:  Negative for dizziness, weakness and light-headedness.  Hematological:  Negative for adenopathy. Does not bruise/bleed easily.  Psychiatric/Behavioral:  Positive for confusion and decreased concentration. The patient is not nervous/anxious.     Objective:  BP 116/60 (BP Location: Left Arm, Patient Position: Sitting, Cuff Size: Normal)   Pulse 66   Temp 97.9 F (36.6 C) (Oral)   Ht 5\' 8"  (1.727 m)   Wt 174 lb 9.6 oz (79.2 kg)   SpO2 96%   BMI 26.55 kg/m   BP Readings from Last 3 Encounters:  10/27/23 116/60  03/02/23 (!) 114/48  05/12/22 126/64    Wt Readings from Last 3 Encounters:  10/27/23 174 lb 9.6 oz (79.2 kg)  03/02/23 179 lb (81.2 kg)  02/09/23 185 lb (83.9 kg)    Physical Exam Vitals reviewed.  Constitutional:      Appearance: He is not ill-appearing.  HENT:     Nose: Nose normal.     Mouth/Throat:     Mouth: Mucous membranes are moist.  Eyes:  General: No scleral icterus.    Conjunctiva/sclera: Conjunctivae normal.  Cardiovascular:     Rate and Rhythm: Normal rate and regular rhythm. Occasional Extrasystoles are present.    Heart sounds: Normal heart sounds, S1 normal and S2 normal. No murmur heard.    No gallop.     Comments: EKG----  NSR with PAC's (new), 72 bpm RBBB - new Q wave in III Pulmonary:     Effort: Pulmonary effort is normal.     Breath sounds: No stridor. No wheezing, rhonchi or rales.  Abdominal:     General: Abdomen is flat.     Palpations: There is no mass.     Tenderness: There is no abdominal tenderness. There is no guarding.      Hernia: No hernia is present.  Musculoskeletal:        General: No swelling.     Cervical back: Neck supple.     Right lower leg: No edema.     Left lower leg: No edema.  Skin:    Coloration: Skin is pale.  Neurological:     Mental Status: He is alert. Mental status is at baseline.  Psychiatric:        Mood and Affect: Mood normal.        Behavior: Behavior normal.     Lab Results  Component Value Date   WBC 3.9 (L) 10/27/2023   HGB 9.9 (L) 10/27/2023   HCT 29.8 (L) 10/27/2023   PLT 157.0 10/27/2023   GLUCOSE 90 10/27/2023   CHOL 147 10/02/2020   TRIG 55.0 10/02/2020   HDL 79.30 10/02/2020   LDLCALC 57 10/02/2020   ALT 20 10/27/2023   AST 30 10/27/2023   NA 138 10/27/2023   K 4.6 10/27/2023   CL 104 10/27/2023   CREATININE 1.39 10/27/2023   BUN 31 (H) 10/27/2023   CO2 29 10/27/2023   TSH 2.92 10/27/2023   INR 1.1 10/11/2019   HGBA1C 5.5 04/14/2019    CT Head Wo Contrast Result Date: 10/11/2019 CLINICAL DATA:  89 year old who fell off of a curb while at a shopping center earlier today with loss of consciousness and laceration to the LEFT eyebrow. Patient is amnestic to the event. Initial encounter. Personal history of subdural hematoma. EXAM: CT HEAD WITHOUT CONTRAST CT CERVICAL SPINE WITHOUT CONTRAST TECHNIQUE: Multidetector CT imaging of the head and cervical spine was performed following the standard protocol without intravenous contrast. Multiplanar CT image reconstructions of the cervical spine were also generated. COMPARISON:  05/31/2019 and earlier. FINDINGS: CT HEAD FINDINGS Brain: Moderate cortical, deep and cerebellar atrophy as noted previously. No mass lesion. No midline shift. No acute hemorrhage or hematoma. No extra-axial fluid collections. No evidence of acute infarction. Dural thickening beneath the RIGHT frontal craniotomy flap. Vascular: Moderate to severe BILATERAL carotid siphon and mild BILATERAL vertebral artery atherosclerosis. No hyperdense vessel.  Skull: Prior RIGHT frontal craniotomy. No skull fracture or other focal osseous abnormality involving the skull. Sinuses/Orbits: Preseptal soft tissue swelling/hematoma anterior to the LEFT orbit. No evidence of intraorbital hemorrhage. Other: None. CT CERVICAL SPINE FINDINGS Alignment: Anatomic posterior alignment. Straightening of the usual cervical lordosis. Facet joints anatomically aligned throughout with severe diffuse degenerative changes. Skull base and vertebrae: No fractures identified involving the cervical spine. Coronal reformatted images demonstrate an intact craniocervical junction, intact dens and intact lateral masses throughout. Degenerative changes at the C1-C2 articulation with calcified pannus POSTERIOR to the dens. Soft tissues and spinal canal: No evidence of paraspinous or spinal canal hematoma. No  evidence of spinal stenosis. Disc levels: Severe disc space narrowing and associated endplate hypertrophic changes at C6-7. Moderate disc space narrowing at C5-6 and C7-T1. Calcification within the C2-3 disc. Combination of facet and uncinate hypertrophy account for multilevel foraminal stenoses including severe BILATERAL C3-4, moderate BILATERAL C4-5, severe BILATERAL C5-6, severe BILATERAL C6-7. Upper chest: Visualized lung apices clear. Mild atherosclerosis involving the visualized proximal great vessels. Other: DISH involving the visualized UPPER thoracic spine. BILATERAL cervical carotid atherosclerosis. IMPRESSION: 1. No acute intracranial abnormality. 2. Moderate generalized atrophy. 3. No cervical spine fractures identified. 4. Multilevel degenerative disc disease, spondylosis and foraminal stenoses throughout the cervical spine as detailed above. 5. Preseptal soft tissue swelling/hematoma anterior to the LEFT orbit without evidence of intraorbital hemorrhage. Electronically Signed   By: Hulan Saas M.D.   On: 10/11/2019 18:10   CT Cervical Spine Wo Contrast Result Date:  10/11/2019 CLINICAL DATA:  89 year old who fell off of a curb while at a shopping center earlier today with loss of consciousness and laceration to the LEFT eyebrow. Patient is amnestic to the event. Initial encounter. Personal history of subdural hematoma. EXAM: CT HEAD WITHOUT CONTRAST CT CERVICAL SPINE WITHOUT CONTRAST TECHNIQUE: Multidetector CT imaging of the head and cervical spine was performed following the standard protocol without intravenous contrast. Multiplanar CT image reconstructions of the cervical spine were also generated. COMPARISON:  05/31/2019 and earlier. FINDINGS: CT HEAD FINDINGS Brain: Moderate cortical, deep and cerebellar atrophy as noted previously. No mass lesion. No midline shift. No acute hemorrhage or hematoma. No extra-axial fluid collections. No evidence of acute infarction. Dural thickening beneath the RIGHT frontal craniotomy flap. Vascular: Moderate to severe BILATERAL carotid siphon and mild BILATERAL vertebral artery atherosclerosis. No hyperdense vessel. Skull: Prior RIGHT frontal craniotomy. No skull fracture or other focal osseous abnormality involving the skull. Sinuses/Orbits: Preseptal soft tissue swelling/hematoma anterior to the LEFT orbit. No evidence of intraorbital hemorrhage. Other: None. CT CERVICAL SPINE FINDINGS Alignment: Anatomic posterior alignment. Straightening of the usual cervical lordosis. Facet joints anatomically aligned throughout with severe diffuse degenerative changes. Skull base and vertebrae: No fractures identified involving the cervical spine. Coronal reformatted images demonstrate an intact craniocervical junction, intact dens and intact lateral masses throughout. Degenerative changes at the C1-C2 articulation with calcified pannus POSTERIOR to the dens. Soft tissues and spinal canal: No evidence of paraspinous or spinal canal hematoma. No evidence of spinal stenosis. Disc levels: Severe disc space narrowing and associated endplate hypertrophic  changes at C6-7. Moderate disc space narrowing at C5-6 and C7-T1. Calcification within the C2-3 disc. Combination of facet and uncinate hypertrophy account for multilevel foraminal stenoses including severe BILATERAL C3-4, moderate BILATERAL C4-5, severe BILATERAL C5-6, severe BILATERAL C6-7. Upper chest: Visualized lung apices clear. Mild atherosclerosis involving the visualized proximal great vessels. Other: DISH involving the visualized UPPER thoracic spine. BILATERAL cervical carotid atherosclerosis. IMPRESSION: 1. No acute intracranial abnormality. 2. Moderate generalized atrophy. 3. No cervical spine fractures identified. 4. Multilevel degenerative disc disease, spondylosis and foraminal stenoses throughout the cervical spine as detailed above. 5. Preseptal soft tissue swelling/hematoma anterior to the LEFT orbit without evidence of intraorbital hemorrhage. Electronically Signed   By: Hulan Saas M.D.   On: 10/11/2019 18:10    Assessment & Plan:   Acquired hypothyroidism- He is euthyroid. -     TSH; Future -     Hepatic function panel; Future  Stage 3b chronic kidney disease (HCC)- Will avoid nephrotoxic agents  -     Basic metabolic panel; Future -  Hepatic function panel; Future  Anemia due to zinc deficiency - Will restart the zinc -     CBC with Differential/Platelet; Future -     Zinc Gluconate; Take 1 tablet (50 mg total) by mouth daily.  Dispense: 90 tablet; Refill: 1  PAC (premature atrial contraction- He is asx with this. -     EKG 12-Lead     Follow-up: Return in about 6 months (around 04/28/2024).  Sanda Linger, MD

## 2023-10-27 NOTE — Patient Instructions (Signed)

## 2023-11-04 ENCOUNTER — Telehealth: Payer: Self-pay

## 2023-11-04 NOTE — Telephone Encounter (Signed)
 Advised the patient wife that I sent the forms out the day she requested. She gave me a verbal understanding and states she will continue waiting on the mail.

## 2023-11-04 NOTE — Telephone Encounter (Signed)
 Copied from CRM [id number]. Topic: General - Other >> Nov 04, 2023  2:54 PM Sim Boast F wrote: Reason for CRM: Patient spouse Clenton Pare called to follow up on a senior living form that was filled out by provider when patient was seen 10/27/23 and was told by the nurse that it would be mailed, they have not received the form and would like a call back at [phone]

## 2024-01-12 ENCOUNTER — Telehealth: Payer: Self-pay | Admitting: Internal Medicine

## 2024-01-12 NOTE — Telephone Encounter (Signed)
 We did receive the letter. It is on Dr Rochelle Chu office. His name is on the letter.

## 2024-01-12 NOTE — Telephone Encounter (Signed)
 Copied from CRM [id number]. Topic: General - Other >> Jan 12, 2024  8:51 AM Howard Macho wrote: Reason for CRM: patient wife called stating she came to the office to give a letter to the patient doctor and she wanted to make sure the patient name was up there  CB [phone]  ---  Have we received any new forms for the pt?

## 2024-01-24 ENCOUNTER — Telehealth: Payer: Self-pay | Admitting: Internal Medicine

## 2024-01-24 NOTE — Telephone Encounter (Signed)
 I have advised the patients wife that they will have to see a provider in [place] where they reside to complete this request. She gave a verbal understanding.

## 2024-01-24 NOTE — Telephone Encounter (Signed)
 Copied from CRM [id number]. Topic: Appointments - Scheduling Inquiry for Clinic >> Jan 24, 2024 10:28 AM Chasity T wrote: Reason for CRM: Adel , patient wife is calling in for a physical therapy referral for patient due to his balance being off and has trouble walking. She states that they have moved and not able to see Dr Rochelle Chu to come in and speak with him about sending the referral for him. She is requesting for a call back at [phone].

## 2024-02-18 ENCOUNTER — Telehealth: Payer: Self-pay | Admitting: Internal Medicine

## 2024-02-18 NOTE — Telephone Encounter (Signed)
 Copied from CRM [id number]. Topic: General - Other >> Feb 18, 2024  2:28 PM Drema MATSU wrote: Reason for CRM: Patient wants to check on forms regarding the [facility]. He wants to go  to an inactive status. Patient also states that they and an appointment today and said that she told been advised clinic that they relocated to [place]  for the time being and he shouldn't have had the appointment. She is requesting a callback.

## 2024-02-18 NOTE — Telephone Encounter (Signed)
 Unable to reach patient. LMTRC

## 2024-02-21 DIAGNOSIS — E782 Mixed hyperlipidemia: Secondary | ICD-10-CM | POA: Diagnosis not present

## 2024-02-21 DIAGNOSIS — E038 Other specified hypothyroidism: Secondary | ICD-10-CM | POA: Diagnosis not present

## 2024-02-21 DIAGNOSIS — N400 Enlarged prostate without lower urinary tract symptoms: Secondary | ICD-10-CM | POA: Diagnosis not present

## 2024-02-21 DIAGNOSIS — I10 Essential (primary) hypertension: Secondary | ICD-10-CM | POA: Diagnosis not present

## 2024-02-21 DIAGNOSIS — Z0000 Encounter for general adult medical examination without abnormal findings: Secondary | ICD-10-CM | POA: Diagnosis not present

## 2024-02-24 ENCOUNTER — Encounter: Payer: Self-pay | Admitting: Internal Medicine

## 2024-02-24 NOTE — Telephone Encounter (Signed)
 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

## 2024-02-24 NOTE — Telephone Encounter (Signed)
 Letter written

## 2024-03-02 NOTE — Telephone Encounter (Signed)
 Copied from CRM [id number]. Topic: General - Other >> Mar 02, 2024  9:21 AM Franky GRADE wrote: Reason for CRM: Patient's wife is returning a call she received from the office, I advised that the letter has been written. She would like to know if we can mail the letter to [facility] in [place].

## 2024-03-03 NOTE — Telephone Encounter (Signed)
 Letter has been written and FAXED to [facility] [place] [facility]. Patient and his wife has been made aware.

## 2024-03-06 DIAGNOSIS — N138 Other obstructive and reflux uropathy: Secondary | ICD-10-CM | POA: Diagnosis not present

## 2024-03-06 DIAGNOSIS — N401 Enlarged prostate with lower urinary tract symptoms: Secondary | ICD-10-CM | POA: Diagnosis not present

## 2024-03-07 ENCOUNTER — Other Ambulatory Visit: Payer: Self-pay | Admitting: Internal Medicine

## 2024-03-07 DIAGNOSIS — E039 Hypothyroidism, unspecified: Secondary | ICD-10-CM

## 2024-03-07 DIAGNOSIS — R269 Unspecified abnormalities of gait and mobility: Secondary | ICD-10-CM | POA: Diagnosis not present

## 2024-03-07 DIAGNOSIS — I69051 Hemiplegia and hemiparesis following nontraumatic subarachnoid hemorrhage affecting right dominant side: Secondary | ICD-10-CM | POA: Diagnosis not present

## 2024-03-07 DIAGNOSIS — R2689 Other abnormalities of gait and mobility: Secondary | ICD-10-CM | POA: Diagnosis not present

## 2024-03-07 DIAGNOSIS — I209 Angina pectoris, unspecified: Secondary | ICD-10-CM | POA: Diagnosis not present

## 2024-03-07 DIAGNOSIS — M6281 Muscle weakness (generalized): Secondary | ICD-10-CM | POA: Diagnosis not present

## 2024-03-07 DIAGNOSIS — R279 Unspecified lack of coordination: Secondary | ICD-10-CM | POA: Diagnosis not present

## 2024-03-08 DIAGNOSIS — R279 Unspecified lack of coordination: Secondary | ICD-10-CM | POA: Diagnosis not present

## 2024-03-08 DIAGNOSIS — I209 Angina pectoris, unspecified: Secondary | ICD-10-CM | POA: Diagnosis not present

## 2024-03-08 DIAGNOSIS — I69051 Hemiplegia and hemiparesis following nontraumatic subarachnoid hemorrhage affecting right dominant side: Secondary | ICD-10-CM | POA: Diagnosis not present

## 2024-03-08 DIAGNOSIS — R269 Unspecified abnormalities of gait and mobility: Secondary | ICD-10-CM | POA: Diagnosis not present

## 2024-03-08 DIAGNOSIS — R2689 Other abnormalities of gait and mobility: Secondary | ICD-10-CM | POA: Diagnosis not present

## 2024-03-08 DIAGNOSIS — M6281 Muscle weakness (generalized): Secondary | ICD-10-CM | POA: Diagnosis not present

## 2024-03-09 DIAGNOSIS — N138 Other obstructive and reflux uropathy: Secondary | ICD-10-CM | POA: Diagnosis not present

## 2024-03-09 DIAGNOSIS — R279 Unspecified lack of coordination: Secondary | ICD-10-CM | POA: Diagnosis not present

## 2024-03-09 DIAGNOSIS — N401 Enlarged prostate with lower urinary tract symptoms: Secondary | ICD-10-CM | POA: Diagnosis not present

## 2024-03-09 DIAGNOSIS — R2689 Other abnormalities of gait and mobility: Secondary | ICD-10-CM | POA: Diagnosis not present

## 2024-03-09 DIAGNOSIS — R269 Unspecified abnormalities of gait and mobility: Secondary | ICD-10-CM | POA: Diagnosis not present

## 2024-03-09 DIAGNOSIS — M6281 Muscle weakness (generalized): Secondary | ICD-10-CM | POA: Diagnosis not present

## 2024-03-09 DIAGNOSIS — I69051 Hemiplegia and hemiparesis following nontraumatic subarachnoid hemorrhage affecting right dominant side: Secondary | ICD-10-CM | POA: Diagnosis not present

## 2024-03-09 DIAGNOSIS — I209 Angina pectoris, unspecified: Secondary | ICD-10-CM | POA: Diagnosis not present

## 2024-03-13 DIAGNOSIS — I69051 Hemiplegia and hemiparesis following nontraumatic subarachnoid hemorrhage affecting right dominant side: Secondary | ICD-10-CM | POA: Diagnosis not present

## 2024-03-13 DIAGNOSIS — R2689 Other abnormalities of gait and mobility: Secondary | ICD-10-CM | POA: Diagnosis not present

## 2024-03-13 DIAGNOSIS — I209 Angina pectoris, unspecified: Secondary | ICD-10-CM | POA: Diagnosis not present

## 2024-03-13 DIAGNOSIS — R279 Unspecified lack of coordination: Secondary | ICD-10-CM | POA: Diagnosis not present

## 2024-03-13 DIAGNOSIS — M6281 Muscle weakness (generalized): Secondary | ICD-10-CM | POA: Diagnosis not present

## 2024-03-13 DIAGNOSIS — R269 Unspecified abnormalities of gait and mobility: Secondary | ICD-10-CM | POA: Diagnosis not present

## 2024-03-14 DIAGNOSIS — R2689 Other abnormalities of gait and mobility: Secondary | ICD-10-CM | POA: Diagnosis not present

## 2024-03-14 DIAGNOSIS — R269 Unspecified abnormalities of gait and mobility: Secondary | ICD-10-CM | POA: Diagnosis not present

## 2024-03-14 DIAGNOSIS — R279 Unspecified lack of coordination: Secondary | ICD-10-CM | POA: Diagnosis not present

## 2024-03-14 DIAGNOSIS — I209 Angina pectoris, unspecified: Secondary | ICD-10-CM | POA: Diagnosis not present

## 2024-03-14 DIAGNOSIS — I69051 Hemiplegia and hemiparesis following nontraumatic subarachnoid hemorrhage affecting right dominant side: Secondary | ICD-10-CM | POA: Diagnosis not present

## 2024-03-14 DIAGNOSIS — M6281 Muscle weakness (generalized): Secondary | ICD-10-CM | POA: Diagnosis not present

## 2024-03-15 DIAGNOSIS — M6281 Muscle weakness (generalized): Secondary | ICD-10-CM | POA: Diagnosis not present

## 2024-03-15 DIAGNOSIS — I209 Angina pectoris, unspecified: Secondary | ICD-10-CM | POA: Diagnosis not present

## 2024-03-15 DIAGNOSIS — I69051 Hemiplegia and hemiparesis following nontraumatic subarachnoid hemorrhage affecting right dominant side: Secondary | ICD-10-CM | POA: Diagnosis not present

## 2024-03-15 DIAGNOSIS — R2689 Other abnormalities of gait and mobility: Secondary | ICD-10-CM | POA: Diagnosis not present

## 2024-03-15 DIAGNOSIS — R269 Unspecified abnormalities of gait and mobility: Secondary | ICD-10-CM | POA: Diagnosis not present

## 2024-03-15 DIAGNOSIS — R279 Unspecified lack of coordination: Secondary | ICD-10-CM | POA: Diagnosis not present

## 2024-03-20 DIAGNOSIS — R279 Unspecified lack of coordination: Secondary | ICD-10-CM | POA: Diagnosis not present

## 2024-03-20 DIAGNOSIS — I69051 Hemiplegia and hemiparesis following nontraumatic subarachnoid hemorrhage affecting right dominant side: Secondary | ICD-10-CM | POA: Diagnosis not present

## 2024-03-20 DIAGNOSIS — R269 Unspecified abnormalities of gait and mobility: Secondary | ICD-10-CM | POA: Diagnosis not present

## 2024-03-20 DIAGNOSIS — M6281 Muscle weakness (generalized): Secondary | ICD-10-CM | POA: Diagnosis not present

## 2024-03-20 DIAGNOSIS — I209 Angina pectoris, unspecified: Secondary | ICD-10-CM | POA: Diagnosis not present

## 2024-03-20 DIAGNOSIS — R2689 Other abnormalities of gait and mobility: Secondary | ICD-10-CM | POA: Diagnosis not present

## 2024-03-21 DIAGNOSIS — R279 Unspecified lack of coordination: Secondary | ICD-10-CM | POA: Diagnosis not present

## 2024-03-21 DIAGNOSIS — R2689 Other abnormalities of gait and mobility: Secondary | ICD-10-CM | POA: Diagnosis not present

## 2024-03-21 DIAGNOSIS — I69051 Hemiplegia and hemiparesis following nontraumatic subarachnoid hemorrhage affecting right dominant side: Secondary | ICD-10-CM | POA: Diagnosis not present

## 2024-03-21 DIAGNOSIS — I209 Angina pectoris, unspecified: Secondary | ICD-10-CM | POA: Diagnosis not present

## 2024-03-21 DIAGNOSIS — R269 Unspecified abnormalities of gait and mobility: Secondary | ICD-10-CM | POA: Diagnosis not present

## 2024-03-21 DIAGNOSIS — M6281 Muscle weakness (generalized): Secondary | ICD-10-CM | POA: Diagnosis not present

## 2024-03-22 DIAGNOSIS — R269 Unspecified abnormalities of gait and mobility: Secondary | ICD-10-CM | POA: Diagnosis not present

## 2024-03-22 DIAGNOSIS — I69051 Hemiplegia and hemiparesis following nontraumatic subarachnoid hemorrhage affecting right dominant side: Secondary | ICD-10-CM | POA: Diagnosis not present

## 2024-03-22 DIAGNOSIS — R279 Unspecified lack of coordination: Secondary | ICD-10-CM | POA: Diagnosis not present

## 2024-03-22 DIAGNOSIS — I209 Angina pectoris, unspecified: Secondary | ICD-10-CM | POA: Diagnosis not present

## 2024-03-22 DIAGNOSIS — R2689 Other abnormalities of gait and mobility: Secondary | ICD-10-CM | POA: Diagnosis not present

## 2024-03-22 DIAGNOSIS — M6281 Muscle weakness (generalized): Secondary | ICD-10-CM | POA: Diagnosis not present

## 2024-03-23 DIAGNOSIS — I209 Angina pectoris, unspecified: Secondary | ICD-10-CM | POA: Diagnosis not present

## 2024-03-23 DIAGNOSIS — R269 Unspecified abnormalities of gait and mobility: Secondary | ICD-10-CM | POA: Diagnosis not present

## 2024-03-23 DIAGNOSIS — M6281 Muscle weakness (generalized): Secondary | ICD-10-CM | POA: Diagnosis not present

## 2024-03-23 DIAGNOSIS — R279 Unspecified lack of coordination: Secondary | ICD-10-CM | POA: Diagnosis not present

## 2024-03-23 DIAGNOSIS — R2689 Other abnormalities of gait and mobility: Secondary | ICD-10-CM | POA: Diagnosis not present

## 2024-03-23 DIAGNOSIS — I69051 Hemiplegia and hemiparesis following nontraumatic subarachnoid hemorrhage affecting right dominant side: Secondary | ICD-10-CM | POA: Diagnosis not present

## 2024-03-27 DIAGNOSIS — R279 Unspecified lack of coordination: Secondary | ICD-10-CM | POA: Diagnosis not present

## 2024-03-27 DIAGNOSIS — M6281 Muscle weakness (generalized): Secondary | ICD-10-CM | POA: Diagnosis not present

## 2024-03-27 DIAGNOSIS — R269 Unspecified abnormalities of gait and mobility: Secondary | ICD-10-CM | POA: Diagnosis not present

## 2024-03-27 DIAGNOSIS — I209 Angina pectoris, unspecified: Secondary | ICD-10-CM | POA: Diagnosis not present

## 2024-03-27 DIAGNOSIS — I69051 Hemiplegia and hemiparesis following nontraumatic subarachnoid hemorrhage affecting right dominant side: Secondary | ICD-10-CM | POA: Diagnosis not present

## 2024-03-27 DIAGNOSIS — R2689 Other abnormalities of gait and mobility: Secondary | ICD-10-CM | POA: Diagnosis not present

## 2024-03-28 DIAGNOSIS — M6281 Muscle weakness (generalized): Secondary | ICD-10-CM | POA: Diagnosis not present

## 2024-03-28 DIAGNOSIS — R269 Unspecified abnormalities of gait and mobility: Secondary | ICD-10-CM | POA: Diagnosis not present

## 2024-03-28 DIAGNOSIS — I69051 Hemiplegia and hemiparesis following nontraumatic subarachnoid hemorrhage affecting right dominant side: Secondary | ICD-10-CM | POA: Diagnosis not present

## 2024-03-28 DIAGNOSIS — I209 Angina pectoris, unspecified: Secondary | ICD-10-CM | POA: Diagnosis not present

## 2024-03-28 DIAGNOSIS — R279 Unspecified lack of coordination: Secondary | ICD-10-CM | POA: Diagnosis not present

## 2024-03-28 DIAGNOSIS — R2689 Other abnormalities of gait and mobility: Secondary | ICD-10-CM | POA: Diagnosis not present

## 2024-03-29 DIAGNOSIS — R2689 Other abnormalities of gait and mobility: Secondary | ICD-10-CM | POA: Diagnosis not present

## 2024-03-29 DIAGNOSIS — R269 Unspecified abnormalities of gait and mobility: Secondary | ICD-10-CM | POA: Diagnosis not present

## 2024-03-29 DIAGNOSIS — I69051 Hemiplegia and hemiparesis following nontraumatic subarachnoid hemorrhage affecting right dominant side: Secondary | ICD-10-CM | POA: Diagnosis not present

## 2024-03-29 DIAGNOSIS — R279 Unspecified lack of coordination: Secondary | ICD-10-CM | POA: Diagnosis not present

## 2024-03-29 DIAGNOSIS — I209 Angina pectoris, unspecified: Secondary | ICD-10-CM | POA: Diagnosis not present

## 2024-03-29 DIAGNOSIS — M6281 Muscle weakness (generalized): Secondary | ICD-10-CM | POA: Diagnosis not present

## 2024-03-30 DIAGNOSIS — R269 Unspecified abnormalities of gait and mobility: Secondary | ICD-10-CM | POA: Diagnosis not present

## 2024-03-30 DIAGNOSIS — M6281 Muscle weakness (generalized): Secondary | ICD-10-CM | POA: Diagnosis not present

## 2024-03-30 DIAGNOSIS — R279 Unspecified lack of coordination: Secondary | ICD-10-CM | POA: Diagnosis not present

## 2024-03-30 DIAGNOSIS — I209 Angina pectoris, unspecified: Secondary | ICD-10-CM | POA: Diagnosis not present

## 2024-03-30 DIAGNOSIS — I69051 Hemiplegia and hemiparesis following nontraumatic subarachnoid hemorrhage affecting right dominant side: Secondary | ICD-10-CM | POA: Diagnosis not present

## 2024-03-30 DIAGNOSIS — R2689 Other abnormalities of gait and mobility: Secondary | ICD-10-CM | POA: Diagnosis not present

## 2024-04-03 DIAGNOSIS — I69051 Hemiplegia and hemiparesis following nontraumatic subarachnoid hemorrhage affecting right dominant side: Secondary | ICD-10-CM | POA: Diagnosis not present

## 2024-04-03 DIAGNOSIS — I209 Angina pectoris, unspecified: Secondary | ICD-10-CM | POA: Diagnosis not present

## 2024-04-03 DIAGNOSIS — R279 Unspecified lack of coordination: Secondary | ICD-10-CM | POA: Diagnosis not present

## 2024-04-03 DIAGNOSIS — M6281 Muscle weakness (generalized): Secondary | ICD-10-CM | POA: Diagnosis not present

## 2024-04-03 DIAGNOSIS — R2689 Other abnormalities of gait and mobility: Secondary | ICD-10-CM | POA: Diagnosis not present

## 2024-04-03 DIAGNOSIS — R269 Unspecified abnormalities of gait and mobility: Secondary | ICD-10-CM | POA: Diagnosis not present

## 2024-04-04 DIAGNOSIS — I69051 Hemiplegia and hemiparesis following nontraumatic subarachnoid hemorrhage affecting right dominant side: Secondary | ICD-10-CM | POA: Diagnosis not present

## 2024-04-04 DIAGNOSIS — R269 Unspecified abnormalities of gait and mobility: Secondary | ICD-10-CM | POA: Diagnosis not present

## 2024-04-04 DIAGNOSIS — M6281 Muscle weakness (generalized): Secondary | ICD-10-CM | POA: Diagnosis not present

## 2024-04-04 DIAGNOSIS — R279 Unspecified lack of coordination: Secondary | ICD-10-CM | POA: Diagnosis not present

## 2024-04-04 DIAGNOSIS — R2689 Other abnormalities of gait and mobility: Secondary | ICD-10-CM | POA: Diagnosis not present

## 2024-04-04 DIAGNOSIS — I209 Angina pectoris, unspecified: Secondary | ICD-10-CM | POA: Diagnosis not present

## 2024-04-06 ENCOUNTER — Other Ambulatory Visit: Payer: Self-pay | Admitting: Internal Medicine

## 2024-04-06 DIAGNOSIS — N138 Other obstructive and reflux uropathy: Secondary | ICD-10-CM | POA: Diagnosis not present

## 2024-04-06 DIAGNOSIS — N2889 Other specified disorders of kidney and ureter: Secondary | ICD-10-CM | POA: Diagnosis not present

## 2024-04-06 DIAGNOSIS — E039 Hypothyroidism, unspecified: Secondary | ICD-10-CM

## 2024-04-06 DIAGNOSIS — N401 Enlarged prostate with lower urinary tract symptoms: Secondary | ICD-10-CM | POA: Diagnosis not present

## 2024-04-10 DIAGNOSIS — M6281 Muscle weakness (generalized): Secondary | ICD-10-CM | POA: Diagnosis not present

## 2024-04-10 DIAGNOSIS — R279 Unspecified lack of coordination: Secondary | ICD-10-CM | POA: Diagnosis not present

## 2024-04-10 DIAGNOSIS — R269 Unspecified abnormalities of gait and mobility: Secondary | ICD-10-CM | POA: Diagnosis not present

## 2024-04-10 DIAGNOSIS — I69051 Hemiplegia and hemiparesis following nontraumatic subarachnoid hemorrhage affecting right dominant side: Secondary | ICD-10-CM | POA: Diagnosis not present

## 2024-04-10 DIAGNOSIS — R2689 Other abnormalities of gait and mobility: Secondary | ICD-10-CM | POA: Diagnosis not present

## 2024-04-10 DIAGNOSIS — I209 Angina pectoris, unspecified: Secondary | ICD-10-CM | POA: Diagnosis not present

## 2024-04-11 DIAGNOSIS — R279 Unspecified lack of coordination: Secondary | ICD-10-CM | POA: Diagnosis not present

## 2024-04-11 DIAGNOSIS — M6281 Muscle weakness (generalized): Secondary | ICD-10-CM | POA: Diagnosis not present

## 2024-04-11 DIAGNOSIS — I69051 Hemiplegia and hemiparesis following nontraumatic subarachnoid hemorrhage affecting right dominant side: Secondary | ICD-10-CM | POA: Diagnosis not present

## 2024-04-11 DIAGNOSIS — R269 Unspecified abnormalities of gait and mobility: Secondary | ICD-10-CM | POA: Diagnosis not present

## 2024-04-11 DIAGNOSIS — R2689 Other abnormalities of gait and mobility: Secondary | ICD-10-CM | POA: Diagnosis not present

## 2024-04-11 DIAGNOSIS — I209 Angina pectoris, unspecified: Secondary | ICD-10-CM | POA: Diagnosis not present

## 2024-04-12 DIAGNOSIS — R269 Unspecified abnormalities of gait and mobility: Secondary | ICD-10-CM | POA: Diagnosis not present

## 2024-04-12 DIAGNOSIS — M6281 Muscle weakness (generalized): Secondary | ICD-10-CM | POA: Diagnosis not present

## 2024-04-12 DIAGNOSIS — R2689 Other abnormalities of gait and mobility: Secondary | ICD-10-CM | POA: Diagnosis not present

## 2024-04-12 DIAGNOSIS — I69051 Hemiplegia and hemiparesis following nontraumatic subarachnoid hemorrhage affecting right dominant side: Secondary | ICD-10-CM | POA: Diagnosis not present

## 2024-04-12 DIAGNOSIS — R279 Unspecified lack of coordination: Secondary | ICD-10-CM | POA: Diagnosis not present

## 2024-04-12 DIAGNOSIS — I209 Angina pectoris, unspecified: Secondary | ICD-10-CM | POA: Diagnosis not present

## 2024-04-13 DIAGNOSIS — R279 Unspecified lack of coordination: Secondary | ICD-10-CM | POA: Diagnosis not present

## 2024-04-13 DIAGNOSIS — I209 Angina pectoris, unspecified: Secondary | ICD-10-CM | POA: Diagnosis not present

## 2024-04-13 DIAGNOSIS — R2689 Other abnormalities of gait and mobility: Secondary | ICD-10-CM | POA: Diagnosis not present

## 2024-04-13 DIAGNOSIS — I69051 Hemiplegia and hemiparesis following nontraumatic subarachnoid hemorrhage affecting right dominant side: Secondary | ICD-10-CM | POA: Diagnosis not present

## 2024-04-13 DIAGNOSIS — R269 Unspecified abnormalities of gait and mobility: Secondary | ICD-10-CM | POA: Diagnosis not present

## 2024-04-13 DIAGNOSIS — M6281 Muscle weakness (generalized): Secondary | ICD-10-CM | POA: Diagnosis not present

## 2024-04-16 DIAGNOSIS — B348 Other viral infections of unspecified site: Secondary | ICD-10-CM | POA: Diagnosis not present

## 2024-04-16 DIAGNOSIS — R051 Acute cough: Secondary | ICD-10-CM | POA: Diagnosis not present

## 2024-04-18 DIAGNOSIS — R279 Unspecified lack of coordination: Secondary | ICD-10-CM | POA: Diagnosis not present

## 2024-04-18 DIAGNOSIS — R269 Unspecified abnormalities of gait and mobility: Secondary | ICD-10-CM | POA: Diagnosis not present

## 2024-04-18 DIAGNOSIS — M6281 Muscle weakness (generalized): Secondary | ICD-10-CM | POA: Diagnosis not present

## 2024-04-18 DIAGNOSIS — R2689 Other abnormalities of gait and mobility: Secondary | ICD-10-CM | POA: Diagnosis not present

## 2024-04-18 DIAGNOSIS — I69051 Hemiplegia and hemiparesis following nontraumatic subarachnoid hemorrhage affecting right dominant side: Secondary | ICD-10-CM | POA: Diagnosis not present

## 2024-04-18 DIAGNOSIS — I209 Angina pectoris, unspecified: Secondary | ICD-10-CM | POA: Diagnosis not present

## 2024-04-19 DIAGNOSIS — R279 Unspecified lack of coordination: Secondary | ICD-10-CM | POA: Diagnosis not present

## 2024-04-19 DIAGNOSIS — M6281 Muscle weakness (generalized): Secondary | ICD-10-CM | POA: Diagnosis not present

## 2024-04-19 DIAGNOSIS — I209 Angina pectoris, unspecified: Secondary | ICD-10-CM | POA: Diagnosis not present

## 2024-04-19 DIAGNOSIS — R269 Unspecified abnormalities of gait and mobility: Secondary | ICD-10-CM | POA: Diagnosis not present

## 2024-04-19 DIAGNOSIS — I69051 Hemiplegia and hemiparesis following nontraumatic subarachnoid hemorrhage affecting right dominant side: Secondary | ICD-10-CM | POA: Diagnosis not present

## 2024-04-19 DIAGNOSIS — R2689 Other abnormalities of gait and mobility: Secondary | ICD-10-CM | POA: Diagnosis not present

## 2024-04-20 DIAGNOSIS — M6281 Muscle weakness (generalized): Secondary | ICD-10-CM | POA: Diagnosis not present

## 2024-04-20 DIAGNOSIS — R2689 Other abnormalities of gait and mobility: Secondary | ICD-10-CM | POA: Diagnosis not present

## 2024-04-20 DIAGNOSIS — I209 Angina pectoris, unspecified: Secondary | ICD-10-CM | POA: Diagnosis not present

## 2024-04-20 DIAGNOSIS — R269 Unspecified abnormalities of gait and mobility: Secondary | ICD-10-CM | POA: Diagnosis not present

## 2024-04-20 DIAGNOSIS — I69051 Hemiplegia and hemiparesis following nontraumatic subarachnoid hemorrhage affecting right dominant side: Secondary | ICD-10-CM | POA: Diagnosis not present

## 2024-04-20 DIAGNOSIS — R279 Unspecified lack of coordination: Secondary | ICD-10-CM | POA: Diagnosis not present

## 2024-04-21 DIAGNOSIS — R279 Unspecified lack of coordination: Secondary | ICD-10-CM | POA: Diagnosis not present

## 2024-04-21 DIAGNOSIS — I209 Angina pectoris, unspecified: Secondary | ICD-10-CM | POA: Diagnosis not present

## 2024-04-21 DIAGNOSIS — R2689 Other abnormalities of gait and mobility: Secondary | ICD-10-CM | POA: Diagnosis not present

## 2024-04-21 DIAGNOSIS — I69051 Hemiplegia and hemiparesis following nontraumatic subarachnoid hemorrhage affecting right dominant side: Secondary | ICD-10-CM | POA: Diagnosis not present

## 2024-04-21 DIAGNOSIS — R269 Unspecified abnormalities of gait and mobility: Secondary | ICD-10-CM | POA: Diagnosis not present

## 2024-04-21 DIAGNOSIS — M6281 Muscle weakness (generalized): Secondary | ICD-10-CM | POA: Diagnosis not present

## 2024-04-23 DIAGNOSIS — E038 Other specified hypothyroidism: Secondary | ICD-10-CM | POA: Diagnosis not present

## 2024-04-23 DIAGNOSIS — N400 Enlarged prostate without lower urinary tract symptoms: Secondary | ICD-10-CM | POA: Diagnosis not present

## 2024-04-23 DIAGNOSIS — I10 Essential (primary) hypertension: Secondary | ICD-10-CM | POA: Diagnosis not present

## 2024-04-23 DIAGNOSIS — E782 Mixed hyperlipidemia: Secondary | ICD-10-CM | POA: Diagnosis not present

## 2024-04-25 DIAGNOSIS — R269 Unspecified abnormalities of gait and mobility: Secondary | ICD-10-CM | POA: Diagnosis not present

## 2024-04-25 DIAGNOSIS — R2689 Other abnormalities of gait and mobility: Secondary | ICD-10-CM | POA: Diagnosis not present

## 2024-04-25 DIAGNOSIS — M6281 Muscle weakness (generalized): Secondary | ICD-10-CM | POA: Diagnosis not present

## 2024-04-25 DIAGNOSIS — R279 Unspecified lack of coordination: Secondary | ICD-10-CM | POA: Diagnosis not present

## 2024-04-25 DIAGNOSIS — I209 Angina pectoris, unspecified: Secondary | ICD-10-CM | POA: Diagnosis not present

## 2024-04-25 DIAGNOSIS — I69051 Hemiplegia and hemiparesis following nontraumatic subarachnoid hemorrhage affecting right dominant side: Secondary | ICD-10-CM | POA: Diagnosis not present

## 2024-04-26 DIAGNOSIS — R279 Unspecified lack of coordination: Secondary | ICD-10-CM | POA: Diagnosis not present

## 2024-04-26 DIAGNOSIS — R2689 Other abnormalities of gait and mobility: Secondary | ICD-10-CM | POA: Diagnosis not present

## 2024-04-26 DIAGNOSIS — M6281 Muscle weakness (generalized): Secondary | ICD-10-CM | POA: Diagnosis not present

## 2024-04-26 DIAGNOSIS — R269 Unspecified abnormalities of gait and mobility: Secondary | ICD-10-CM | POA: Diagnosis not present

## 2024-04-26 DIAGNOSIS — I69051 Hemiplegia and hemiparesis following nontraumatic subarachnoid hemorrhage affecting right dominant side: Secondary | ICD-10-CM | POA: Diagnosis not present

## 2024-04-26 DIAGNOSIS — I209 Angina pectoris, unspecified: Secondary | ICD-10-CM | POA: Diagnosis not present

## 2024-04-28 DIAGNOSIS — R279 Unspecified lack of coordination: Secondary | ICD-10-CM | POA: Diagnosis not present

## 2024-04-28 DIAGNOSIS — R2689 Other abnormalities of gait and mobility: Secondary | ICD-10-CM | POA: Diagnosis not present

## 2024-04-28 DIAGNOSIS — I209 Angina pectoris, unspecified: Secondary | ICD-10-CM | POA: Diagnosis not present

## 2024-04-28 DIAGNOSIS — M6281 Muscle weakness (generalized): Secondary | ICD-10-CM | POA: Diagnosis not present

## 2024-04-28 DIAGNOSIS — I69051 Hemiplegia and hemiparesis following nontraumatic subarachnoid hemorrhage affecting right dominant side: Secondary | ICD-10-CM | POA: Diagnosis not present

## 2024-04-28 DIAGNOSIS — R269 Unspecified abnormalities of gait and mobility: Secondary | ICD-10-CM | POA: Diagnosis not present

## 2024-05-01 DIAGNOSIS — M6281 Muscle weakness (generalized): Secondary | ICD-10-CM | POA: Diagnosis not present

## 2024-05-01 DIAGNOSIS — I209 Angina pectoris, unspecified: Secondary | ICD-10-CM | POA: Diagnosis not present

## 2024-05-01 DIAGNOSIS — R279 Unspecified lack of coordination: Secondary | ICD-10-CM | POA: Diagnosis not present

## 2024-05-01 DIAGNOSIS — I69051 Hemiplegia and hemiparesis following nontraumatic subarachnoid hemorrhage affecting right dominant side: Secondary | ICD-10-CM | POA: Diagnosis not present

## 2024-05-01 DIAGNOSIS — R269 Unspecified abnormalities of gait and mobility: Secondary | ICD-10-CM | POA: Diagnosis not present

## 2024-05-01 DIAGNOSIS — R2689 Other abnormalities of gait and mobility: Secondary | ICD-10-CM | POA: Diagnosis not present

## 2024-05-02 DIAGNOSIS — R279 Unspecified lack of coordination: Secondary | ICD-10-CM | POA: Diagnosis not present

## 2024-05-02 DIAGNOSIS — I209 Angina pectoris, unspecified: Secondary | ICD-10-CM | POA: Diagnosis not present

## 2024-05-02 DIAGNOSIS — R2689 Other abnormalities of gait and mobility: Secondary | ICD-10-CM | POA: Diagnosis not present

## 2024-05-02 DIAGNOSIS — R269 Unspecified abnormalities of gait and mobility: Secondary | ICD-10-CM | POA: Diagnosis not present

## 2024-05-02 DIAGNOSIS — I69051 Hemiplegia and hemiparesis following nontraumatic subarachnoid hemorrhage affecting right dominant side: Secondary | ICD-10-CM | POA: Diagnosis not present

## 2024-05-02 DIAGNOSIS — M6281 Muscle weakness (generalized): Secondary | ICD-10-CM | POA: Diagnosis not present

## 2024-05-03 DIAGNOSIS — M6281 Muscle weakness (generalized): Secondary | ICD-10-CM | POA: Diagnosis not present

## 2024-05-03 DIAGNOSIS — I69051 Hemiplegia and hemiparesis following nontraumatic subarachnoid hemorrhage affecting right dominant side: Secondary | ICD-10-CM | POA: Diagnosis not present

## 2024-05-03 DIAGNOSIS — R279 Unspecified lack of coordination: Secondary | ICD-10-CM | POA: Diagnosis not present

## 2024-05-03 DIAGNOSIS — I209 Angina pectoris, unspecified: Secondary | ICD-10-CM | POA: Diagnosis not present

## 2024-05-03 DIAGNOSIS — R269 Unspecified abnormalities of gait and mobility: Secondary | ICD-10-CM | POA: Diagnosis not present

## 2024-05-03 DIAGNOSIS — R2689 Other abnormalities of gait and mobility: Secondary | ICD-10-CM | POA: Diagnosis not present

## 2024-05-04 DIAGNOSIS — M6281 Muscle weakness (generalized): Secondary | ICD-10-CM | POA: Diagnosis not present

## 2024-05-04 DIAGNOSIS — I69051 Hemiplegia and hemiparesis following nontraumatic subarachnoid hemorrhage affecting right dominant side: Secondary | ICD-10-CM | POA: Diagnosis not present

## 2024-05-04 DIAGNOSIS — R269 Unspecified abnormalities of gait and mobility: Secondary | ICD-10-CM | POA: Diagnosis not present

## 2024-05-04 DIAGNOSIS — R279 Unspecified lack of coordination: Secondary | ICD-10-CM | POA: Diagnosis not present

## 2024-05-04 DIAGNOSIS — R2689 Other abnormalities of gait and mobility: Secondary | ICD-10-CM | POA: Diagnosis not present

## 2024-05-04 DIAGNOSIS — I209 Angina pectoris, unspecified: Secondary | ICD-10-CM | POA: Diagnosis not present

## 2024-05-08 DIAGNOSIS — R269 Unspecified abnormalities of gait and mobility: Secondary | ICD-10-CM | POA: Diagnosis not present

## 2024-05-08 DIAGNOSIS — M6281 Muscle weakness (generalized): Secondary | ICD-10-CM | POA: Diagnosis not present

## 2024-05-08 DIAGNOSIS — I209 Angina pectoris, unspecified: Secondary | ICD-10-CM | POA: Diagnosis not present

## 2024-05-08 DIAGNOSIS — R2689 Other abnormalities of gait and mobility: Secondary | ICD-10-CM | POA: Diagnosis not present

## 2024-05-08 DIAGNOSIS — I69051 Hemiplegia and hemiparesis following nontraumatic subarachnoid hemorrhage affecting right dominant side: Secondary | ICD-10-CM | POA: Diagnosis not present

## 2024-05-08 DIAGNOSIS — R279 Unspecified lack of coordination: Secondary | ICD-10-CM | POA: Diagnosis not present

## 2024-05-08 DIAGNOSIS — N400 Enlarged prostate without lower urinary tract symptoms: Secondary | ICD-10-CM | POA: Diagnosis not present

## 2024-05-09 DIAGNOSIS — R269 Unspecified abnormalities of gait and mobility: Secondary | ICD-10-CM | POA: Diagnosis not present

## 2024-05-09 DIAGNOSIS — R2689 Other abnormalities of gait and mobility: Secondary | ICD-10-CM | POA: Diagnosis not present

## 2024-05-09 DIAGNOSIS — R279 Unspecified lack of coordination: Secondary | ICD-10-CM | POA: Diagnosis not present

## 2024-05-09 DIAGNOSIS — M6281 Muscle weakness (generalized): Secondary | ICD-10-CM | POA: Diagnosis not present

## 2024-05-09 DIAGNOSIS — I209 Angina pectoris, unspecified: Secondary | ICD-10-CM | POA: Diagnosis not present

## 2024-05-09 DIAGNOSIS — I69051 Hemiplegia and hemiparesis following nontraumatic subarachnoid hemorrhage affecting right dominant side: Secondary | ICD-10-CM | POA: Diagnosis not present

## 2024-05-10 DIAGNOSIS — I69051 Hemiplegia and hemiparesis following nontraumatic subarachnoid hemorrhage affecting right dominant side: Secondary | ICD-10-CM | POA: Diagnosis not present

## 2024-05-10 DIAGNOSIS — R269 Unspecified abnormalities of gait and mobility: Secondary | ICD-10-CM | POA: Diagnosis not present

## 2024-05-10 DIAGNOSIS — M6281 Muscle weakness (generalized): Secondary | ICD-10-CM | POA: Diagnosis not present

## 2024-05-10 DIAGNOSIS — I209 Angina pectoris, unspecified: Secondary | ICD-10-CM | POA: Diagnosis not present

## 2024-05-10 DIAGNOSIS — R2689 Other abnormalities of gait and mobility: Secondary | ICD-10-CM | POA: Diagnosis not present

## 2024-05-10 DIAGNOSIS — R279 Unspecified lack of coordination: Secondary | ICD-10-CM | POA: Diagnosis not present

## 2024-05-11 DIAGNOSIS — I209 Angina pectoris, unspecified: Secondary | ICD-10-CM | POA: Diagnosis not present

## 2024-05-11 DIAGNOSIS — R269 Unspecified abnormalities of gait and mobility: Secondary | ICD-10-CM | POA: Diagnosis not present

## 2024-05-11 DIAGNOSIS — I69051 Hemiplegia and hemiparesis following nontraumatic subarachnoid hemorrhage affecting right dominant side: Secondary | ICD-10-CM | POA: Diagnosis not present

## 2024-05-11 DIAGNOSIS — M6281 Muscle weakness (generalized): Secondary | ICD-10-CM | POA: Diagnosis not present

## 2024-05-11 DIAGNOSIS — R279 Unspecified lack of coordination: Secondary | ICD-10-CM | POA: Diagnosis not present

## 2024-05-11 DIAGNOSIS — R2689 Other abnormalities of gait and mobility: Secondary | ICD-10-CM | POA: Diagnosis not present

## 2024-05-15 DIAGNOSIS — M6281 Muscle weakness (generalized): Secondary | ICD-10-CM | POA: Diagnosis not present

## 2024-05-15 DIAGNOSIS — R279 Unspecified lack of coordination: Secondary | ICD-10-CM | POA: Diagnosis not present

## 2024-05-15 DIAGNOSIS — I69051 Hemiplegia and hemiparesis following nontraumatic subarachnoid hemorrhage affecting right dominant side: Secondary | ICD-10-CM | POA: Diagnosis not present

## 2024-05-15 DIAGNOSIS — R269 Unspecified abnormalities of gait and mobility: Secondary | ICD-10-CM | POA: Diagnosis not present

## 2024-05-15 DIAGNOSIS — R2689 Other abnormalities of gait and mobility: Secondary | ICD-10-CM | POA: Diagnosis not present

## 2024-05-15 DIAGNOSIS — I209 Angina pectoris, unspecified: Secondary | ICD-10-CM | POA: Diagnosis not present

## 2024-05-17 DIAGNOSIS — R279 Unspecified lack of coordination: Secondary | ICD-10-CM | POA: Diagnosis not present

## 2024-05-17 DIAGNOSIS — R269 Unspecified abnormalities of gait and mobility: Secondary | ICD-10-CM | POA: Diagnosis not present

## 2024-05-17 DIAGNOSIS — Z23 Encounter for immunization: Secondary | ICD-10-CM | POA: Diagnosis not present

## 2024-05-17 DIAGNOSIS — I69051 Hemiplegia and hemiparesis following nontraumatic subarachnoid hemorrhage affecting right dominant side: Secondary | ICD-10-CM | POA: Diagnosis not present

## 2024-05-17 DIAGNOSIS — M6281 Muscle weakness (generalized): Secondary | ICD-10-CM | POA: Diagnosis not present

## 2024-05-17 DIAGNOSIS — I209 Angina pectoris, unspecified: Secondary | ICD-10-CM | POA: Diagnosis not present

## 2024-05-17 DIAGNOSIS — R2689 Other abnormalities of gait and mobility: Secondary | ICD-10-CM | POA: Diagnosis not present

## 2024-05-18 DIAGNOSIS — R2689 Other abnormalities of gait and mobility: Secondary | ICD-10-CM | POA: Diagnosis not present

## 2024-05-18 DIAGNOSIS — I209 Angina pectoris, unspecified: Secondary | ICD-10-CM | POA: Diagnosis not present

## 2024-05-18 DIAGNOSIS — R269 Unspecified abnormalities of gait and mobility: Secondary | ICD-10-CM | POA: Diagnosis not present

## 2024-05-18 DIAGNOSIS — I69051 Hemiplegia and hemiparesis following nontraumatic subarachnoid hemorrhage affecting right dominant side: Secondary | ICD-10-CM | POA: Diagnosis not present

## 2024-05-18 DIAGNOSIS — R279 Unspecified lack of coordination: Secondary | ICD-10-CM | POA: Diagnosis not present

## 2024-05-18 DIAGNOSIS — M6281 Muscle weakness (generalized): Secondary | ICD-10-CM | POA: Diagnosis not present

## 2024-05-22 DIAGNOSIS — R269 Unspecified abnormalities of gait and mobility: Secondary | ICD-10-CM | POA: Diagnosis not present

## 2024-05-22 DIAGNOSIS — R2689 Other abnormalities of gait and mobility: Secondary | ICD-10-CM | POA: Diagnosis not present

## 2024-05-22 DIAGNOSIS — I209 Angina pectoris, unspecified: Secondary | ICD-10-CM | POA: Diagnosis not present

## 2024-05-22 DIAGNOSIS — R279 Unspecified lack of coordination: Secondary | ICD-10-CM | POA: Diagnosis not present

## 2024-05-22 DIAGNOSIS — M6281 Muscle weakness (generalized): Secondary | ICD-10-CM | POA: Diagnosis not present

## 2024-05-22 DIAGNOSIS — I69051 Hemiplegia and hemiparesis following nontraumatic subarachnoid hemorrhage affecting right dominant side: Secondary | ICD-10-CM | POA: Diagnosis not present

## 2024-05-28 DIAGNOSIS — Z23 Encounter for immunization: Secondary | ICD-10-CM | POA: Diagnosis not present

## 2024-05-29 DIAGNOSIS — R279 Unspecified lack of coordination: Secondary | ICD-10-CM | POA: Diagnosis not present

## 2024-05-29 DIAGNOSIS — R2689 Other abnormalities of gait and mobility: Secondary | ICD-10-CM | POA: Diagnosis not present

## 2024-05-29 DIAGNOSIS — I69051 Hemiplegia and hemiparesis following nontraumatic subarachnoid hemorrhage affecting right dominant side: Secondary | ICD-10-CM | POA: Diagnosis not present

## 2024-05-29 DIAGNOSIS — M6281 Muscle weakness (generalized): Secondary | ICD-10-CM | POA: Diagnosis not present

## 2024-05-29 DIAGNOSIS — I209 Angina pectoris, unspecified: Secondary | ICD-10-CM | POA: Diagnosis not present

## 2024-05-29 DIAGNOSIS — R269 Unspecified abnormalities of gait and mobility: Secondary | ICD-10-CM | POA: Diagnosis not present

## 2024-05-30 DIAGNOSIS — I209 Angina pectoris, unspecified: Secondary | ICD-10-CM | POA: Diagnosis not present

## 2024-05-30 DIAGNOSIS — R269 Unspecified abnormalities of gait and mobility: Secondary | ICD-10-CM | POA: Diagnosis not present

## 2024-05-30 DIAGNOSIS — M6281 Muscle weakness (generalized): Secondary | ICD-10-CM | POA: Diagnosis not present

## 2024-05-30 DIAGNOSIS — R279 Unspecified lack of coordination: Secondary | ICD-10-CM | POA: Diagnosis not present

## 2024-05-30 DIAGNOSIS — I69051 Hemiplegia and hemiparesis following nontraumatic subarachnoid hemorrhage affecting right dominant side: Secondary | ICD-10-CM | POA: Diagnosis not present

## 2024-05-30 DIAGNOSIS — R2689 Other abnormalities of gait and mobility: Secondary | ICD-10-CM | POA: Diagnosis not present

## 2024-05-31 DIAGNOSIS — R279 Unspecified lack of coordination: Secondary | ICD-10-CM | POA: Diagnosis not present

## 2024-05-31 DIAGNOSIS — M6281 Muscle weakness (generalized): Secondary | ICD-10-CM | POA: Diagnosis not present

## 2024-05-31 DIAGNOSIS — R2689 Other abnormalities of gait and mobility: Secondary | ICD-10-CM | POA: Diagnosis not present

## 2024-05-31 DIAGNOSIS — I69051 Hemiplegia and hemiparesis following nontraumatic subarachnoid hemorrhage affecting right dominant side: Secondary | ICD-10-CM | POA: Diagnosis not present

## 2024-05-31 DIAGNOSIS — I209 Angina pectoris, unspecified: Secondary | ICD-10-CM | POA: Diagnosis not present

## 2024-05-31 DIAGNOSIS — R269 Unspecified abnormalities of gait and mobility: Secondary | ICD-10-CM | POA: Diagnosis not present

## 2024-06-21 DIAGNOSIS — R41841 Cognitive communication deficit: Secondary | ICD-10-CM | POA: Diagnosis not present

## 2024-06-21 DIAGNOSIS — I69051 Hemiplegia and hemiparesis following nontraumatic subarachnoid hemorrhage affecting right dominant side: Secondary | ICD-10-CM | POA: Diagnosis not present

## 2024-06-26 DIAGNOSIS — N400 Enlarged prostate without lower urinary tract symptoms: Secondary | ICD-10-CM | POA: Diagnosis not present

## 2024-06-28 DIAGNOSIS — I69051 Hemiplegia and hemiparesis following nontraumatic subarachnoid hemorrhage affecting right dominant side: Secondary | ICD-10-CM | POA: Diagnosis not present

## 2024-06-28 DIAGNOSIS — R41841 Cognitive communication deficit: Secondary | ICD-10-CM | POA: Diagnosis not present

## 2024-07-12 DIAGNOSIS — R41841 Cognitive communication deficit: Secondary | ICD-10-CM | POA: Diagnosis not present

## 2024-07-12 DIAGNOSIS — I69051 Hemiplegia and hemiparesis following nontraumatic subarachnoid hemorrhage affecting right dominant side: Secondary | ICD-10-CM | POA: Diagnosis not present

## 2024-07-26 ENCOUNTER — Other Ambulatory Visit: Payer: Self-pay | Admitting: Internal Medicine

## 2024-07-26 DIAGNOSIS — N400 Enlarged prostate without lower urinary tract symptoms: Secondary | ICD-10-CM | POA: Diagnosis not present

## 2024-07-26 DIAGNOSIS — E039 Hypothyroidism, unspecified: Secondary | ICD-10-CM
